# Patient Record
Sex: Male | Born: 1935 | Race: White | Hispanic: No | Marital: Married | Smoking: Former smoker
Health system: Southern US, Community
[De-identification: ages and names within clinical notes are randomized; demographics above are authoritative.]

## PROBLEM LIST (undated history)

## (undated) DIAGNOSIS — F329 Major depressive disorder, single episode, unspecified: Secondary | ICD-10-CM

## (undated) DIAGNOSIS — K635 Polyp of colon: Secondary | ICD-10-CM

## (undated) DIAGNOSIS — M25559 Pain in unspecified hip: Secondary | ICD-10-CM

## (undated) DIAGNOSIS — K5790 Diverticulosis of intestine, part unspecified, without perforation or abscess without bleeding: Secondary | ICD-10-CM

## (undated) DIAGNOSIS — F32A Depression, unspecified: Secondary | ICD-10-CM

## (undated) DIAGNOSIS — G250 Essential tremor: Secondary | ICD-10-CM

## (undated) DIAGNOSIS — G252 Other specified forms of tremor: Secondary | ICD-10-CM

## (undated) DIAGNOSIS — M549 Dorsalgia, unspecified: Secondary | ICD-10-CM

## (undated) DIAGNOSIS — I509 Heart failure, unspecified: Secondary | ICD-10-CM

## (undated) DIAGNOSIS — R269 Unspecified abnormalities of gait and mobility: Secondary | ICD-10-CM

## (undated) DIAGNOSIS — F068 Other specified mental disorders due to known physiological condition: Secondary | ICD-10-CM

## (undated) DIAGNOSIS — E119 Type 2 diabetes mellitus without complications: Secondary | ICD-10-CM

## (undated) DIAGNOSIS — C4440 Unspecified malignant neoplasm of skin of scalp and neck: Secondary | ICD-10-CM

## (undated) DIAGNOSIS — M48061 Spinal stenosis, lumbar region without neurogenic claudication: Secondary | ICD-10-CM

## (undated) DIAGNOSIS — Z96643 Presence of artificial hip joint, bilateral: Secondary | ICD-10-CM

## (undated) DIAGNOSIS — K649 Unspecified hemorrhoids: Secondary | ICD-10-CM

## (undated) DIAGNOSIS — F0390 Unspecified dementia without behavioral disturbance: Secondary | ICD-10-CM

## (undated) DIAGNOSIS — R413 Other amnesia: Secondary | ICD-10-CM

## (undated) DIAGNOSIS — G609 Hereditary and idiopathic neuropathy, unspecified: Secondary | ICD-10-CM

## (undated) HISTORY — DX: Diverticulosis of intestine, part unspecified, without perforation or abscess without bleeding: K57.90

## (undated) HISTORY — DX: Unspecified malignant neoplasm of skin of scalp and neck: C44.40

## (undated) HISTORY — DX: Hereditary and idiopathic neuropathy, unspecified: G60.9

## (undated) HISTORY — DX: Unspecified dementia without behavioral disturbance: F03.90

## (undated) HISTORY — DX: Essential tremor: G25.0

## (undated) HISTORY — DX: Pain in unspecified hip: M25.559

## (undated) HISTORY — DX: Essential tremor: G25.2

## (undated) HISTORY — DX: Unspecified abnormalities of gait and mobility: R26.9

## (undated) HISTORY — DX: Other specified mental disorders due to known physiological condition: F06.8

## (undated) HISTORY — DX: Spinal stenosis, lumbar region without neurogenic claudication: M48.061

## (undated) HISTORY — DX: Dorsalgia, unspecified: M54.9

## (undated) HISTORY — DX: Unspecified hemorrhoids: K64.9

## (undated) HISTORY — PX: JOINT REPLACEMENT: SHX530

## (undated) HISTORY — DX: Presence of artificial hip joint, bilateral: Z96.643

## (undated) HISTORY — DX: Polyp of colon: K63.5

## (undated) HISTORY — PX: SKIN CANCER EXCISION: SHX779

---

## 1998-03-14 ENCOUNTER — Encounter: Payer: Self-pay | Admitting: Orthopedic Surgery

## 1998-03-14 ENCOUNTER — Ambulatory Visit (HOSPITAL_COMMUNITY): Admission: RE | Admit: 1998-03-14 | Discharge: 1998-03-14 | Payer: Self-pay | Admitting: Orthopedic Surgery

## 1998-04-02 HISTORY — PX: TOTAL HIP ARTHROPLASTY: SHX124

## 1998-04-22 ENCOUNTER — Ambulatory Visit (HOSPITAL_COMMUNITY): Admission: RE | Admit: 1998-04-22 | Discharge: 1998-04-22 | Payer: Self-pay | Admitting: Orthopedic Surgery

## 1998-04-22 ENCOUNTER — Encounter: Payer: Self-pay | Admitting: Orthopedic Surgery

## 1998-07-08 ENCOUNTER — Encounter: Payer: Self-pay | Admitting: Orthopedic Surgery

## 1998-07-19 ENCOUNTER — Encounter: Payer: Self-pay | Admitting: Orthopedic Surgery

## 1998-07-19 ENCOUNTER — Inpatient Hospital Stay (HOSPITAL_COMMUNITY): Admission: RE | Admit: 1998-07-19 | Discharge: 1998-07-25 | Payer: Self-pay | Admitting: Orthopedic Surgery

## 2001-04-14 ENCOUNTER — Encounter: Admission: RE | Admit: 2001-04-14 | Discharge: 2001-04-14 | Payer: Self-pay | Admitting: Internal Medicine

## 2001-04-14 ENCOUNTER — Encounter: Payer: Self-pay | Admitting: Internal Medicine

## 2002-05-21 ENCOUNTER — Encounter: Payer: Self-pay | Admitting: Neurology

## 2002-05-21 ENCOUNTER — Ambulatory Visit (HOSPITAL_COMMUNITY): Admission: RE | Admit: 2002-05-21 | Discharge: 2002-05-21 | Payer: Self-pay | Admitting: Neurology

## 2002-07-02 ENCOUNTER — Encounter: Payer: Self-pay | Admitting: Orthopedic Surgery

## 2002-07-02 ENCOUNTER — Encounter: Admission: RE | Admit: 2002-07-02 | Discharge: 2002-07-02 | Payer: Self-pay | Admitting: Orthopedic Surgery

## 2006-09-12 ENCOUNTER — Ambulatory Visit: Payer: Self-pay | Admitting: Internal Medicine

## 2006-09-26 ENCOUNTER — Encounter: Payer: Self-pay | Admitting: Internal Medicine

## 2006-09-26 ENCOUNTER — Ambulatory Visit: Payer: Self-pay | Admitting: Internal Medicine

## 2007-12-02 ENCOUNTER — Encounter: Admission: RE | Admit: 2007-12-02 | Discharge: 2007-12-02 | Payer: Self-pay | Admitting: Sports Medicine

## 2008-06-11 ENCOUNTER — Encounter: Admission: RE | Admit: 2008-06-11 | Discharge: 2008-06-11 | Payer: Self-pay | Admitting: Orthopedic Surgery

## 2009-04-02 HISTORY — PX: OTHER SURGICAL HISTORY: SHX169

## 2009-12-20 ENCOUNTER — Inpatient Hospital Stay (HOSPITAL_COMMUNITY)
Admission: RE | Admit: 2009-12-20 | Discharge: 2009-12-23 | Payer: Self-pay | Source: Home / Self Care | Admitting: Orthopedic Surgery

## 2010-04-02 HISTORY — PX: RETINAL LASER PROCEDURE: SHX2339

## 2010-04-02 HISTORY — PX: CATARACT EXTRACTION, BILATERAL: SHX1313

## 2010-06-15 LAB — BASIC METABOLIC PANEL
BUN: 16 mg/dL (ref 6–23)
CO2: 27 mEq/L (ref 19–32)
CO2: 29 mEq/L (ref 19–32)
Calcium: 7.9 mg/dL — ABNORMAL LOW (ref 8.4–10.5)
Calcium: 8.2 mg/dL — ABNORMAL LOW (ref 8.4–10.5)
Chloride: 102 mEq/L (ref 96–112)
Chloride: 108 mEq/L (ref 96–112)
Chloride: 107 mEq/L (ref 96–112)
Creatinine, Ser: 0.88 mg/dL (ref 0.4–1.5)
Creatinine, Ser: 0.93 mg/dL (ref 0.4–1.5)
GFR calc Af Amer: >60 mL/min (ref >60)
GFR calc Af Amer: >60 mL/min (ref >60)
GFR calc Af Amer: >60 mL/min (ref >60)
GFR calc non Af Amer: >60 mL/min (ref >60)
Glucose, Bld: 164 mg/dL — ABNORMAL HIGH (ref 70–99)
Potassium: 3.6 mEq/L (ref 3.5–5.1)
Potassium: 3.6 mEq/L (ref 3.5–5.1)
Sodium: 137 mEq/L (ref 135–145)
Sodium: 138 mEq/L (ref 135–145)
Sodium: 141 mEq/L (ref 135–145)

## 2010-06-15 LAB — CBC
HCT: 43.9 % (ref 39.0–52.0)
Hemoglobin: 11 g/dL — ABNORMAL LOW (ref 13.0–17.0)
Hemoglobin: 9.4 g/dL — ABNORMAL LOW (ref 13.0–17.0)
Hemoglobin: 15.1 g/dL (ref 13.0–17.0)
MCH: 34.7 pg — ABNORMAL HIGH (ref 26.0–34.0)
MCHC: 34.6 g/dL (ref 30.0–36.0)
MCV: 101.2 fL — ABNORMAL HIGH (ref 78.0–100.0)
MCV: 101.7 fL — ABNORMAL HIGH (ref 78.0–100.0)
Platelets: 103 10*3/uL — ABNORMAL LOW (ref 150–400)
Platelets: 91 10*3/uL — ABNORMAL LOW (ref 150–400)
Platelets: 136 10*3/uL — ABNORMAL LOW (ref 150–400)
RBC: 2.67 MIL/uL — ABNORMAL LOW (ref 4.22–5.81)
RBC: 4.32 MIL/uL (ref 4.22–5.81)
RDW: 13 % (ref 11.5–15.5)
WBC: 10.1 10*3/uL (ref 4.0–10.5)
WBC: 8.8 10*3/uL (ref 4.0–10.5)

## 2010-06-15 LAB — TYPE AND SCREEN
ABO/RH(D): A NEG
Antibody Screen: NEGATIVE

## 2010-06-15 LAB — GLUCOSE, CAPILLARY
Glucose-Capillary: 114 mg/dL — ABNORMAL HIGH (ref 70–99)
Glucose-Capillary: 116 mg/dL — ABNORMAL HIGH (ref 70–99)
Glucose-Capillary: 124 mg/dL — ABNORMAL HIGH (ref 70–99)
Glucose-Capillary: 133 mg/dL — ABNORMAL HIGH (ref 70–99)
Glucose-Capillary: 133 mg/dL — ABNORMAL HIGH (ref 70–99)
Glucose-Capillary: 155 mg/dL — ABNORMAL HIGH (ref 70–99)
Glucose-Capillary: 160 mg/dL — ABNORMAL HIGH (ref 70–99)
Glucose-Capillary: 160 mg/dL — ABNORMAL HIGH (ref 70–99)
Glucose-Capillary: 192 mg/dL — ABNORMAL HIGH (ref 70–99)

## 2010-06-15 LAB — PROTIME-INR: Prothrombin Time: 13.4 seconds (ref 11.6–15.2)

## 2010-06-15 LAB — DIFFERENTIAL
Eosinophils Relative: 3 % (ref 0–5)
Lymphocytes Relative: 17 % (ref 12–46)
Lymphs Abs: 1.5 10*3/uL (ref 0.7–4.0)
Monocytes Absolute: 0.5 10*3/uL (ref 0.1–1.0)
Monocytes Relative: 6 % (ref 3–12)

## 2010-06-15 LAB — URINALYSIS, ROUTINE W REFLEX MICROSCOPIC
Glucose, UA: NEGATIVE mg/dL
Hgb urine dipstick: NEGATIVE
pH: 6 (ref 5.0–8.0)

## 2010-07-05 ENCOUNTER — Ambulatory Visit: Payer: Self-pay | Admitting: Ophthalmology

## 2010-07-12 ENCOUNTER — Ambulatory Visit: Payer: Self-pay | Admitting: Ophthalmology

## 2010-10-26 ENCOUNTER — Encounter: Payer: Self-pay | Admitting: Podiatry

## 2010-12-21 ENCOUNTER — Other Ambulatory Visit: Payer: Self-pay | Admitting: Internal Medicine

## 2010-12-21 DIAGNOSIS — F0390 Unspecified dementia without behavioral disturbance: Secondary | ICD-10-CM

## 2011-01-01 ENCOUNTER — Ambulatory Visit
Admission: RE | Admit: 2011-01-01 | Discharge: 2011-01-01 | Disposition: A | Discharge status: Home / Self Care | Payer: Medicare Other | Source: Ambulatory Visit | Attending: Internal Medicine | Admitting: Internal Medicine

## 2011-01-01 DIAGNOSIS — F0390 Unspecified dementia without behavioral disturbance: Secondary | ICD-10-CM

## 2011-01-03 ENCOUNTER — Ambulatory Visit: Payer: Medicare Other | Attending: Internal Medicine | Admitting: Rehabilitative and Restorative Service Providers"

## 2011-01-03 DIAGNOSIS — IMO0001 Reserved for inherently not codable concepts without codable children: Secondary | ICD-10-CM | POA: Insufficient documentation

## 2011-01-03 DIAGNOSIS — R269 Unspecified abnormalities of gait and mobility: Secondary | ICD-10-CM | POA: Insufficient documentation

## 2011-01-03 DIAGNOSIS — M6281 Muscle weakness (generalized): Secondary | ICD-10-CM | POA: Insufficient documentation

## 2011-01-16 ENCOUNTER — Ambulatory Visit: Payer: Medicare Other | Admitting: Rehabilitative and Restorative Service Providers"

## 2011-01-19 ENCOUNTER — Ambulatory Visit: Payer: Medicare Other | Admitting: Rehabilitative and Restorative Service Providers"

## 2011-01-24 ENCOUNTER — Ambulatory Visit: Payer: Medicare Other | Admitting: Rehabilitative and Restorative Service Providers"

## 2011-01-26 ENCOUNTER — Ambulatory Visit: Payer: Medicare Other | Admitting: Rehabilitative and Restorative Service Providers"

## 2011-01-31 ENCOUNTER — Ambulatory Visit: Payer: Medicare Other | Admitting: Rehabilitative and Restorative Service Providers"

## 2011-02-01 ENCOUNTER — Ambulatory Visit
Admission: RE | Admit: 2011-02-01 | Discharge: 2011-02-01 | Disposition: A | Discharge status: Home / Self Care | Payer: Medicare Other | Source: Ambulatory Visit | Attending: Internal Medicine | Admitting: Internal Medicine

## 2011-02-01 ENCOUNTER — Encounter: Payer: Medicare Other | Admitting: Rehabilitative and Restorative Service Providers"

## 2011-02-01 ENCOUNTER — Other Ambulatory Visit: Payer: Self-pay | Admitting: Internal Medicine

## 2011-02-01 DIAGNOSIS — IMO0002 Reserved for concepts with insufficient information to code with codable children: Secondary | ICD-10-CM

## 2011-02-05 ENCOUNTER — Encounter: Payer: Medicare Other | Admitting: Rehabilitative and Restorative Service Providers"

## 2011-02-08 ENCOUNTER — Encounter: Payer: Medicare Other | Admitting: Rehabilitative and Restorative Service Providers"

## 2011-07-16 ENCOUNTER — Other Ambulatory Visit: Payer: Self-pay | Admitting: Dermatology

## 2012-04-08 ENCOUNTER — Other Ambulatory Visit: Payer: Self-pay | Admitting: Dermatology

## 2012-05-27 ENCOUNTER — Other Ambulatory Visit: Payer: Self-pay | Admitting: Neurology

## 2012-05-27 DIAGNOSIS — F0390 Unspecified dementia without behavioral disturbance: Secondary | ICD-10-CM

## 2012-06-01 ENCOUNTER — Ambulatory Visit
Admission: RE | Admit: 2012-06-01 | Discharge: 2012-06-01 | Disposition: A | Discharge status: Home / Self Care | Payer: No Typology Code available for payment source | Source: Ambulatory Visit | Attending: Neurology | Admitting: Neurology

## 2012-06-01 DIAGNOSIS — F0390 Unspecified dementia without behavioral disturbance: Secondary | ICD-10-CM

## 2012-06-01 DIAGNOSIS — R413 Other amnesia: Secondary | ICD-10-CM

## 2012-06-11 ENCOUNTER — Other Ambulatory Visit: Payer: Self-pay | Admitting: Neurology

## 2012-06-11 DIAGNOSIS — R413 Other amnesia: Secondary | ICD-10-CM

## 2012-06-18 ENCOUNTER — Encounter (HOSPITAL_COMMUNITY): Payer: Self-pay

## 2012-06-18 ENCOUNTER — Encounter (HOSPITAL_COMMUNITY)
Admission: RE | Admit: 2012-06-18 | Discharge: 2012-06-18 | Disposition: A | Discharge status: Home / Self Care | Payer: Medicare Other | Source: Ambulatory Visit | Attending: Neurology | Admitting: Neurology

## 2012-06-18 DIAGNOSIS — R413 Other amnesia: Secondary | ICD-10-CM | POA: Insufficient documentation

## 2012-06-18 HISTORY — DX: Other amnesia: R41.3

## 2012-07-09 ENCOUNTER — Other Ambulatory Visit: Payer: Self-pay | Admitting: Internal Medicine

## 2012-07-09 DIAGNOSIS — R350 Frequency of micturition: Secondary | ICD-10-CM

## 2012-07-09 DIAGNOSIS — N390 Urinary tract infection, site not specified: Secondary | ICD-10-CM

## 2012-07-10 ENCOUNTER — Ambulatory Visit
Admission: RE | Admit: 2012-07-10 | Discharge: 2012-07-10 | Disposition: A | Discharge status: Home / Self Care | Payer: Medicare Other | Source: Ambulatory Visit | Attending: Internal Medicine | Admitting: Internal Medicine

## 2012-07-10 DIAGNOSIS — N390 Urinary tract infection, site not specified: Secondary | ICD-10-CM

## 2012-07-10 DIAGNOSIS — R350 Frequency of micturition: Secondary | ICD-10-CM

## 2012-11-03 ENCOUNTER — Telehealth: Payer: Self-pay | Admitting: Neurology

## 2012-11-03 NOTE — Telephone Encounter (Signed)
Pt has not been seen by Dr. Athar, prior Dr. Love pt needs to be reassigned per Dr. Athar °

## 2012-11-04 NOTE — Telephone Encounter (Signed)
Ok to be seen by Dr. Frances Furbish schedule is fixed. KJ

## 2012-11-12 ENCOUNTER — Encounter: Payer: Self-pay | Admitting: Neurology

## 2012-11-12 ENCOUNTER — Ambulatory Visit (INDEPENDENT_AMBULATORY_CARE_PROVIDER_SITE_OTHER): Payer: Medicare Other | Admitting: Neurology

## 2012-11-12 VITALS — BP 126/73 | HR 61 | Temp 97.5°F | Ht 69.0 in | Wt 193.0 lb

## 2012-11-12 DIAGNOSIS — F0280 Dementia in other diseases classified elsewhere without behavioral disturbance: Secondary | ICD-10-CM | POA: Insufficient documentation

## 2012-11-12 DIAGNOSIS — F0390 Unspecified dementia without behavioral disturbance: Secondary | ICD-10-CM

## 2012-11-12 DIAGNOSIS — Z96643 Presence of artificial hip joint, bilateral: Secondary | ICD-10-CM | POA: Insufficient documentation

## 2012-11-12 DIAGNOSIS — Z96649 Presence of unspecified artificial hip joint: Secondary | ICD-10-CM

## 2012-11-12 HISTORY — DX: Presence of artificial hip joint, bilateral: Z96.643

## 2012-11-12 HISTORY — DX: Unspecified dementia, unspecified severity, without behavioral disturbance, psychotic disturbance, mood disturbance, and anxiety: F03.90

## 2012-11-12 NOTE — Progress Notes (Signed)
Subjective:    Patient ID: Jose [name] is a 79 y.o. male.  HPI  Interim history:   Jose [name] is a very friendly 79 year old right-handed gentleman who presents for followup consultation of his memory loss. He is accompanied by his wife today. This is his first visit after Dr. Imagene Gurney retirement and his last visit with Dr. Sandria Manly was on 05/14/2012, which time Dr. Sandria Manly felt that he was a potential candidate for a clinical trial. The patient's MMSE score was 26, clock drawing was 4 out of 4, and his falls assessment tool score was 12. He has an underlying medical history of left eye retinal disease, arthritis and status post left hip replacement in 2000, right hip replacement in 2011, essential tremor with a positive FHx, peripheral neuropathy, hyperlipidemia, hypertension, low back pain and memory loss. He is currently on dorzolamide, furosemide, tramadol, gabapentin, Klor-Con, flaxseed oil, simvastatin, atenolol, Wellbutrin XL, Aricept 23 mg, adult aspirin, Namenda, multivitamin, fish oil, vitamin D, vitamin C, folic acid.  I reviewed Dr. Imagene Gurney prior notes and the patient's records and below is a summary of that review:  79 year old right-handed gentleman with progressive memory loss since April 2000. This was after his hip surgery. His second hip surgery was done under spinal anesthesia with good results. EMG and nerve conduction studies as well as lumbar spine MRI in November 2012 showed multilevel spondylosis, as moderately severe canal stenosis and broad-based disc bulge at L1-2. B12 level, RPR, TSH were normal. MRI brain with and without contrast on February 2004 showed atrophy and small vessel disease. He had a repeat MRI in October 2012. He has been on Aricept since 2004 and [place] since March 2005. He also has peripheral neuropathy for which he had evaluation in January 2009 with SPEP, ACE level, vitamin B12, hemoglobin A1c, PSA, TSH, lipids, CBC, CMP, urinalysis, all normal in February 2010. In  December 2012 his MMSE was 63, and in April 2013 his MMSE was 22, clock drawing was 4, animal fluency was 18.  He was recently started on low dose Lexapro by Dr. Waynard Edwards. The patient has had a tendency to pick at his skin lesions on the scalp. His son and daughter are local and involved. 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 He no longer drives for the past year. He goes for aqua exercises 3 times/week.  He has had no VH/AH.  Eating too much and forgetting that he ate has been an issue per wife.  He had a sleep study in the past year, which was negative for OSA per wife.  His Past Medical History Is Significant For: Past Medical History  Diagnosis Date  . Memory loss   . 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 Spinal stenosis, lumbar region, without neurogenic claudication   . Abnormality of gait   . Backache, unspecified   . Unspecified hereditary and idiopathic peripheral neuropathy   . Pain in joint, pelvic region and thigh   . Essential and other specified forms of tremor   . Dementia 11/12/2012  . History of bilateral hip replacements 11/12/2012    His Past Surgical History Is Significant For: Past Surgical History  Procedure Laterality Date  . Total hip arthroplasty Left 2000  . Total hip arthroplasty (aka replacement) Right 2011  . Cataract extraction, bilateral  2012  . Retinal laser procedure  2012    retinal wrinkle    His Family History Is Significant For: Family History  Problem Relation  Age of Onset  . Heart failure Mother     His Social History Is Significant For: History   Social History  . Marital Status: Married    Spouse Name: N/A    Number of Children: N/A  . Years of Education: N/A   Social History Main Topics  . Smoking status: Former Smoker    Types: Cigarettes  . Smokeless tobacco: None  . Alcohol Use: 8.4 oz/week    14 Glasses of wine per week  . Drug Use: No  . Sexual Activity: None   Other Topics  Concern  . None   Social History Narrative  . None    His Allergies Are:  No Known Allergies:   His Current Medications Are:  Outpatient Encounter Prescriptions as of 11/12/2012  Medication Sig Dispense Refill  . Ascorbic Acid (VITAMIN C) 1000 MG tablet Take 1,000 mg by mouth daily.        Marland Kitchen aspirin 81 MG tablet Take 81 mg by mouth daily.        Marland Kitchen atenolol-chlorthalidone (TENORETIC) 50-25 MG per tablet Take 0.5 tablets by mouth daily.        Marland Kitchen buPROPion (WELLBUTRIN XL) 150 MG 24 hr tablet Take 300 mg by mouth daily.        . Cholecalciferol (VITAMIN D) 1000 UNITS capsule Take 1,000 Units by mouth daily.        Marland Kitchen donepezil (ARICEPT) 23 MG TABS tablet Take 23 mg by mouth daily.        . dorzolamide-timolol (COSOPT) 22.3-6.8 MG/ML ophthalmic solution       . escitalopram (LEXAPRO) 10 MG tablet       . Flaxseed, Linseed, (FLAXSEED OIL) 1000 MG CAPS Take 1,000 mg by mouth daily.        . folic acid (FOLVITE) 400 MCG tablet Take 400 mcg by mouth daily.        . furosemide (LASIX) 20 MG tablet       . gabapentin (NEURONTIN) 100 MG capsule       . KLOR-CON M10 10 MEQ tablet       . memantine (NAMENDA) 10 MG tablet Take 28 mg by mouth daily.       . metFORMIN (GLUCOPHAGE) 500 MG tablet       . Multiple Vitamin (MULTIVITAMIN) tablet Take 1 tablet by mouth daily.        . Omega-3 Fatty Acids (FISH OIL) 1000 MG CAPS Take 1,000 mg by mouth 3 (three) times daily.        . Omeprazole (PRILOSEC PO) Take by mouth 2 (two) times a week.        . potassium chloride (KLOR-CON) 10 MEQ CR tablet Take 10 mEq by mouth 2 (two) times a week.        . simvastatin (ZOCOR) 40 MG tablet Take 40 mg by mouth daily.         No facility-administered encounter medications on file as of 11/12/2012.    Review of Systems  Constitutional: Positive for fatigue.  Cardiovascular: Positive for leg swelling.  Neurological:       Memory  Psychiatric/Behavioral:       Too much sleep    Objective:  Neurologic  Exam  Physical Exam Physical Examination:   Filed Vitals:   11/12/12 1046  BP: 126/73  Pulse: 61  Temp: 97.5 F (36.4 C)    General Examination: The patient is a very pleasant 79 y.o. male in no acute distress. He is calm  and cooperative with the exam. He denies Auditory Hallucinations and Visual Hallucinations.   HEENT: Normocephalic, atraumatic, pupils are equal, round and reactive to light and accommodation. Funduscopic exam is normal with sharp disc margins noted. Extraocular tracking shows mild saccadic breakdown without nystagmus noted. Hearing is intact. Tympanic membranes are clear bilaterally. Face is symmetric with no facial masking and normal facial sensation. There is no lip, neck or jaw tremor. Neck is not rigid with intact passive ROM. There are no carotid bruits on auscultation. Oropharynx exam reveals mild mouth dryness. No significant airway crowding is noted. Mallampati is class I. Tongue protrudes centrally and palate elevates symmetrically.    Chest: is clear to auscultation without wheezing, rhonchi or crackles noted.  Heart: sounds are regular and normal without murmurs, rubs or gallops noted.   Abdomen: is soft, non-tender and non-distended with normal bowel sounds appreciated on auscultation.  Extremities: There is 1+ pitting edema in the distal lower extremities bilaterally, left worse. Pedal pulses are intact.  Skin: is warm and dry with no trophic changes noted.  Musculoskeletal: exam reveals no obvious joint deformities, tenderness or joint swelling or erythema.  Neurologically:  Mental status: The patient is awake and alert, paying fair  attention. He is able to partially provide the history. His wife provides most of his Hx. He is oriented to: person, place and day of week. His memory, attention, language and knowledge are impaired. There is no aphasia, agnosia, apraxia or anomia. There is a mild degree of bradyphrenia. Speech is mildly hypophonic with no  dysarthria noted. Mood is congruent and affect is normal.  His MMSE score is 23/30. CDT is 4/4. AFT (Animal Fluency Test) score is 15.   Cranial nerves are as described above under HEENT exam. In addition, shoulder shrug is normal with equal shoulder height noted.  Motor exam: Normal bulk, and strength for age is noted. Tone is not rigid with absence of cogwheeling in the extremities. There is overall no significant bradykinesia. There is no drift or rebound. There is a mild postural and action tremor in both upper extremities. There is no significant resting tremor. Romberg is negative. Reflexes are 1+ in the upper extremities and 1+ in the lower extremities. Fine motor skills are mildly impaired bilaterally.   There is no truncal or gait ataxia.   Sensory exam is intact to light touch.  Gait, station and balance: He stands up from the seated position with mild difficulty and posture is mildly stooped. Stance is narrow-based. He turns in 3 steps. Balance is fairly well preserved.   Assessment and Plan:   Assessment and Plan:  In summary, Jose [name] is a very pleasant 79 y.o.-year old male with an underlying medical history of chronic back pain, arthritis, depression, reflux disease, and hypertension who has a long-standing history of dementia. His MMSE is a little worse than last time. Category fluency is also little less than last time. He is on the maximum doses of Aricept and Namenda and did not qualify for the clinical trial for dementia. I would be reluctant to switch him to another anti-cholinergic medication as he's at risk for side effects. He and his wife are agreeable to staying with the same regimen. Unfortunately there is another whole lot we can do to alter the course of neurodegenerative diseases which are usually empirically progressive. Thankfully he has a very caring and involved family and a good support system overall. He continues to stay active. I have encouraged him to drink  more water.  I had a long chat with the patient and his about my findings and the diagnosis of memory loss and dementia, its prognosis and treatment options. Implications of diagnosis explained at length with the patient and caregiver. We talked about medical treatments and non-pharmacological approaches. We talked about maintaining a healthy lifestyle in general and staying active mentally and physically. I encouraged the patient to eat healthy, exercise daily and keep well hydrated, to keep a scheduled bedtime and wake time routine, to not skip any meals and eat healthy snacks in between meals and to have protein with every meal. I stressed the importance of regular exercise, within of course the patient's own mobility limitations. I encouraged the patient to keep up with current events by reading the news paper or watching the news.   As far as further diagnostic testing is concerned, I suggested the following: no change.  As far as medications are concerned, I recommended the following at this time: no change. He did not require refills today. I answered all their questions today and the patient and his wife were in agreement with the above outlined plan. I would like to see the patient back in 6 months, sooner if the need arises and encouraged them to call with any interim questions, concerns, problems, updates and refill requests.

## 2012-11-12 NOTE — Patient Instructions (Addendum)
I think overall you are doing fairly well and are stable at this point.   I do have some generic suggestions for you today:  Please make sure that you drink plenty of fluids. I would like for you to exercise daily for example in the form of walking 20-30 minutes every day, if you can. Please keep a regular sleep-wake schedule, keep regular meal times, do not skip any meals, eat  healthy snacks in between meals, such as fruit or nuts. Try to eat protein with every meal.   As far as your medications are concerned, I would like to suggest: no changes.    As far as diagnostic testing, I recommend: no new medication.   Engage in social activities in your community and with your family and try to keep up with current events by reading the newspaper or watching the news.  I do not think we need to make any changes in your medications at this point. I think you're stable enough that I can see you back in 6 months, sooner if we need to. Please call us if you have any interim questions, concerns, or problems or updates to need to discuss.  Brett Canales is my clinical assistant and will answer any of your questions and relay your messages to me and will give you my messages.   Our phone number is [phone]. We also have an after hours call service for urgent matters and there is a physician on-call for urgent questions. For any emergencies you know to call 911 or go to the nearest emergency room.

## 2012-12-10 ENCOUNTER — Other Ambulatory Visit: Payer: Self-pay

## 2013-01-15 ENCOUNTER — Ambulatory Visit: Payer: Medicare Other | Admitting: Podiatry

## 2013-02-02 ENCOUNTER — Encounter: Payer: Self-pay | Admitting: Podiatry

## 2013-02-02 ENCOUNTER — Ambulatory Visit (INDEPENDENT_AMBULATORY_CARE_PROVIDER_SITE_OTHER): Payer: Medicare Other | Admitting: Podiatry

## 2013-02-02 VITALS — BP 132/64 | HR 92 | Resp 12

## 2013-02-02 DIAGNOSIS — B351 Tinea unguium: Secondary | ICD-10-CM

## 2013-02-02 DIAGNOSIS — M79609 Pain in unspecified limb: Secondary | ICD-10-CM

## 2013-02-03 NOTE — Progress Notes (Signed)
Subjective:     Patient ID: Jose [name], male   DOB: [birth year], 79 y.o.   MRN: [id number]  HPI patient states please cut my toenails they become tender and they are thick   Review of Systems     Objective:   Physical Exam  Nursing note and vitals reviewed. Constitutional: He is oriented to person, place, and time.  Neurological: He is oriented to person, place, and time.  Skin: Skin is dry.   nail disease with thickness and discomfort 1-5 both feet     Assessment:     Mycotic nail infection with discomfort 1-5 both feet    Plan:     Debridement of nailbeds 1-5 both feet with no iatrogenic bleeding noted

## 2013-04-27 ENCOUNTER — Ambulatory Visit: Payer: Medicare Other | Admitting: Podiatry

## 2013-05-04 ENCOUNTER — Ambulatory Visit (INDEPENDENT_AMBULATORY_CARE_PROVIDER_SITE_OTHER): Payer: Medicare Other | Admitting: Podiatry

## 2013-05-04 ENCOUNTER — Encounter: Payer: Self-pay | Admitting: Podiatry

## 2013-05-04 VITALS — BP 87/70 | HR 81 | Resp 18

## 2013-05-04 DIAGNOSIS — M79609 Pain in unspecified limb: Secondary | ICD-10-CM

## 2013-05-04 DIAGNOSIS — B351 Tinea unguium: Secondary | ICD-10-CM

## 2013-05-04 NOTE — Progress Notes (Signed)
   Subjective:    Patient ID: Jose [name], male    DOB: 1930/10/18, 80 y.o.   MRN: [id number]  HPI  I need my toenails cut on both feet This patient was last seen for similar treatment by DR. Regal on 02/03/2013.  Review of Systems     Objective:   Physical Exam  Orientated x11 80 year old white male  Elongated, hypertrophic, discolored toenails with palpable tenderness in all nail plates.      Assessment & Plan:   Assessment: Symptomatic onychomycoses x10  Plan: Nails x10 are debrided back without a bleeding. Reappoint at three-month intervals

## 2013-05-14 ENCOUNTER — Ambulatory Visit (INDEPENDENT_AMBULATORY_CARE_PROVIDER_SITE_OTHER): Payer: Medicare Other | Admitting: Neurology

## 2013-05-14 ENCOUNTER — Encounter: Payer: Self-pay | Admitting: Neurology

## 2013-05-14 VITALS — BP 132/80 | HR 60 | Temp 96.5°F | Ht 69.0 in | Wt 192.0 lb

## 2013-05-14 DIAGNOSIS — Z96649 Presence of unspecified artificial hip joint: Secondary | ICD-10-CM

## 2013-05-14 DIAGNOSIS — F0390 Unspecified dementia without behavioral disturbance: Secondary | ICD-10-CM

## 2013-05-14 DIAGNOSIS — Z96643 Presence of artificial hip joint, bilateral: Secondary | ICD-10-CM

## 2013-05-14 MED ORDER — MEMANTINE HCL ER 28 MG PO CP24: 28.0000 mg | ORAL_CAPSULE | Freq: Every day | ORAL | Status: DC

## 2013-05-14 MED ORDER — DONEPEZIL HCL 23 MG PO TABS: 23.0000 mg | ORAL_TABLET | Freq: Every day | ORAL | Status: DC

## 2013-05-14 NOTE — Patient Instructions (Addendum)
I think overall you are doing fairly well but I do want to suggest a few things today:  Remember to drink plenty of fluid, eat healthy meals and do not skip any meals. Try to eat protein with a every meal and eat a healthy snack such as fruit or nuts in between meals. Try to keep a regular sleep-wake schedule and try to exercise daily, particularly in the form of walking, 20-30 minutes a day, if you can.   Engage in social activities in your community and with your family and try to keep up with current events by reading the newspaper or watching the news.   As far as your medications are concerned, I would like to suggest no changes today.   As far as diagnostic testing: no new test needed.   I would like to see you back in 6 months, sooner if we need to. Please call us with any interim questions, concerns, problems, updates or refill requests.  Our nursing staff will answer any of your questions and relay your messages to me and also relay most of my messages to you.  Our phone number is [phone]. We also have an after hours call service for urgent matters and there is a physician on-call for urgent questions. For any emergencies you know to call 911 or go to the nearest emergency room.

## 2013-05-14 NOTE — Progress Notes (Signed)
Subjective:    Patient ID: Jose [name] is a 80 y.o. male.  HPI    Interim history:   Mr. Jose [name] is a very friendly 80 year old right-handed gentleman with an underlying medical history of left eye retinal disease, arthritis and status post L THR in 2000, R THR in 2011, ET with a positive FHx, PN, HLP, HTN, LBP, depression, reflux disease, and memory loss, who presents for followup consultation of his memory loss. He is accompanied by his wife again today. I first met him on 11/12/2012, which time I felt that his MMSE was a little worse and his category fluency was also less than before. His MMSE score was 23/30, CDT was 4/4, AFT (Animal Fluency Test) score was 15. He has been on  maximum doses of Aricept and Namenda and he did not qualify for the clinical trial for dementia. I was reluctant to switch him to another anti-cholinergic medication for fear of side effects.   Today, he reports feeling stable and his wife agrees. He has been on Namenda XR 28 mg and Aricept 23 mg daily, tolerating it well. He goes to swim twice a week and walks once weekly. He has a Physiological scientist who works with him very well. He eats well. He does not drink enough water. He drinks a cup of coffee in the morning and likes to drink tea during the day.   He previously followed with Dr. Morene Antu and was last seen by him on 05/14/2012, which time Dr. Erling Cruz felt that he was a potential candidate for a clinical trial. The patient's MMSE score was 26, clock drawing was 4 out of 4, and his falls assessment tool score was 12.  He started having progressive memory loss in April 2000. This was after his hip surgery. His second hip surgery was done under spinal anesthesia with good results. EMG and nerve conduction studies as well as lumbar spine MRI in November 2012 showed multilevel spondylosis, as moderately severe canal stenosis and broad-based disc bulge at L1-2. B12 level, RPR, TSH were normal. MRI brain with and without contrast  on February 2004 showed atrophy and small vessel disease. He had a repeat brain MRI in October 2012. He has been on Aricept since 2004 and [place] since March 2005. He also has peripheral neuropathy for which he had evaluation in January 2009 with SPEP, ACE level, vitamin B12, hemoglobin A1c, PSA, TSH, lipids, CBC, CMP, urinalysis, all normal in February 2010. In December 2012 his MMSE was 53, and in April 2013 his MMSE was 22, clock drawing was 4, animal fluency was 18.  He was started on low dose Lexapro by Dr. Joylene Draft. The patient has had a tendency to pick at his skin lesions on the scalp. His son and daughter are local and involved. 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 He no longer drives. He goes for aqua exercises 3 times/week. He has had no VH/AH. Eating too much and forgetting that he ate has been an issue per wife. He had a sleep study in the past in 2013, which was negative for OSA per wife.  His Past Medical History Is Significant For: Past Medical History  Diagnosis Date  . Memory loss   . 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 Spinal stenosis, lumbar region, without neurogenic claudication   . Abnormality of gait   . Backache, unspecified   . Unspecified hereditary and idiopathic peripheral neuropathy   . Pain in  joint, pelvic region and thigh   . Essential and other specified forms of tremor   . Dementia 11/12/2012  . History of bilateral hip replacements 11/12/2012    His Past Surgical History Is Significant For: Past Surgical History  Procedure Laterality Date  . Total hip arthroplasty Left 2000  . Total hip arthroplasty (aka replacement) Right 2011  . Cataract extraction, bilateral  2012  . Retinal laser procedure  2012    retinal wrinkle    His Family History Is Significant For: Family History  Problem Relation Age of Onset  . Heart failure Mother     His Social History Is Significant For: History   Social History  .  Marital Status: Married    Spouse Name: N/A    Number of Children: N/A  . Years of Education: N/A   Social History Main Topics  . Smoking status: Former Smoker    Types: Cigarettes  . Smokeless tobacco: None  . Alcohol Use: 8.4 oz/week    14 Glasses of wine per week  . Drug Use: No  . Sexual Activity: None   Other Topics Concern  . None   Social History Narrative  . None    His Allergies Are:  No Known Allergies:   His Current Medications Are:  Outpatient Encounter Prescriptions as of 05/14/2013  Medication Sig  . amoxicillin (AMOXIL) 500 MG capsule   . Ascorbic Acid (VITAMIN C) 1000 MG tablet Take 1,000 mg by mouth daily.    Marland Kitchen aspirin 81 MG tablet Take 81 mg by mouth daily.    Marland Kitchen atenolol-chlorthalidone (TENORETIC) 50-25 MG per tablet Take 0.5 tablets by mouth daily.    Marland Kitchen buPROPion (WELLBUTRIN XL) 150 MG 24 hr tablet Take 300 mg by mouth daily.    . Cholecalciferol (VITAMIN D) 1000 UNITS capsule Take 1,000 Units by mouth daily.    Marland Kitchen donepezil (ARICEPT) 23 MG TABS tablet Take 1 tablet (23 mg total) by mouth daily.  . dorzolamide-timolol (COSOPT) 22.3-6.8 MG/ML ophthalmic solution   . Flaxseed, Linseed, (FLAXSEED OIL) 1000 MG CAPS Take 1,000 mg by mouth daily.    . folic acid (FOLVITE) 448 MCG tablet Take 400 mcg by mouth daily.    . furosemide (LASIX) 20 MG tablet   . gabapentin (NEURONTIN) 100 MG capsule   . metFORMIN (GLUCOPHAGE) 500 MG tablet   . Multiple Vitamin (MULTIVITAMIN) tablet Take 1 tablet by mouth daily.    . potassium chloride (KLOR-CON) 10 MEQ CR tablet Take 10 mEq by mouth 2 (two) times a week.    . simvastatin (ZOCOR) 40 MG tablet Take 40 mg by mouth daily.    . [DISCONTINUED] donepezil (ARICEPT) 23 MG TABS tablet Take 23 mg by mouth daily.    . [DISCONTINUED] memantine (NAMENDA) 10 MG tablet Take 28 mg by mouth daily.   . Memantine HCl ER (NAMENDA XR) 28 MG CP24 Take 28 mg by mouth daily.  . Omega-3 Fatty Acids (FISH OIL) 1000 MG CAPS Take 1,000 mg by  mouth 3 (three) times daily.    . Omeprazole (PRILOSEC PO) Take by mouth 2 (two) times a week.    . [DISCONTINUED] escitalopram (LEXAPRO) 10 MG tablet   . [DISCONTINUED] KLOR-CON M10 10 MEQ tablet   :  Review of Systems:  Out of a complete 14 point review of systems, all are reviewed and negative with the exception of these symptoms as listed below: Review of Systems  Constitutional: Negative.   HENT: Positive for rhinorrhea.  Eyes: Negative.   Respiratory: Negative.   Cardiovascular: Positive for leg swelling.  Gastrointestinal: Negative.   Endocrine: Negative.   Genitourinary: Negative.   Musculoskeletal: Positive for back pain and gait problem.  Skin: Negative.   Allergic/Immunologic: Negative.   Neurological: Negative.        Memory loss  Hematological: Negative.   Psychiatric/Behavioral: Negative.     Objective:  Neurologic Exam  Physical Exam Physical Examination:   Filed Vitals:   05/14/13 1452  BP: 132/80  Pulse: 60  Temp: 96.5 F (35.8 C)    General Examination: The patient is a very pleasant 80 y.o. male in no acute distress. He is calm and cooperative with the exam. He denies Auditory Hallucinations and Visual Hallucinations.   HEENT: Normocephalic, atraumatic, pupils are equal, round and reactive to light and accommodation. Funduscopic exam is normal with sharp disc margins noted. Extraocular tracking shows mild saccadic breakdown without nystagmus noted. Hearing is intact. Tympanic membranes are clear bilaterally. Face is symmetric with no facial masking and normal facial sensation. There is no lip, neck or jaw tremor. Neck is not rigid with intact passive ROM. There are no carotid bruits on auscultation. Oropharynx exam reveals mild mouth dryness. No significant airway crowding is noted. Mallampati is class I. Tongue protrudes centrally and palate elevates symmetrically.    Chest: is clear to auscultation without wheezing, rhonchi or crackles  noted.  Heart: sounds are regular and normal without murmurs, rubs or gallops noted.   Abdomen: is soft, non-tender and non-distended with normal bowel sounds appreciated on auscultation.  Extremities: There is 1+ pitting edema in the distal lower extremities bilaterally, left worse than right. Pedal pulses are intact.  Skin: is warm and dry with no trophic changes noted. He has multiple scars on his scalp. He has precancerous lesions which are usually taken off by his dermatologist.  Musculoskeletal: exam reveals no obvious joint deformities, tenderness or joint swelling or erythema.  Neurologically:  Mental status: The patient is awake and alert, paying fair  attention. He is able to partially provide the history. His wife provides most of his Hx. He is oriented to: person, place and day of week. His memory, attention, language and knowledge are impaired. There is no aphasia, agnosia, apraxia or anomia. There is a mild degree of bradyphrenia. Speech is mildly hypophonic with no dysarthria noted. Mood is congruent and affect is normal.  On 11/12/12: His MMSE score was 23/30. CDT was 4/4. AFT (Animal Fluency Test) score was 15.  Today, 05/14/13: MMSE was 19/30, CDT 4/4, AFT 15.  Cranial nerves are as described above under HEENT exam. In addition, shoulder shrug is normal with equal shoulder height noted.  Motor exam: Normal bulk, and strength for age is noted. Tone is not rigid with absence of cogwheeling in the extremities. There is overall no significant bradykinesia. There is no drift or rebound. There is a mild postural and action tremor in both upper extremities. There is no significant resting tremor. Romberg is negative. Reflexes are 1+ in the upper extremities and 1+ in the lower extremities. Fine motor skills are mildly impaired bilaterally.   There is no truncal or gait ataxia.   Sensory exam is intact to light touch, but decreased mildly to vibration sense in both feet.   Gait,  station and balance: He stands up from the seated position with mild difficulty and posture is mildly stooped. Stance is narrow-based. He turns in 3 steps. Balance is fairly well preserved.   Assessment  and Plan:   In summary, JAZMINE LONGSHORE is a very pleasant 80 year old male with an underlying medical history of chronic back pain, arthritis, depression, reflux disease, and hypertension who has a long-standing history of dementia, of approximately 15 years duration. His MMSE is a little worse than last time, category fluency and clock drawing are stable. He is on the maximum doses of Aricept and Namenda and did not qualify for the clinical trial for dementia. I suggested we continue with the current medications. He is advised to increase his water intake. He is encouraged to continue to be active physically and mentally. I would be reluctant to switch him to another anti-cholinergic medication for fear of side effects. He and his wife are agreeable to staying with the same regimen. Unfortunately there is another whole lot we can do to alter the course of neurodegenerative diseases which are usually invariably progressive. Thankfully he has a very caring and involved family and a good support system overall and he has been able to tolerate both medications at the maximum doses. He continues to stay active. I again had a long chat with the patient and his about my findings and the diagnosis of memory loss and dementia, its prognosis and treatment options.  As far as further diagnostic testing is concerned, I suggested the following: no change.  As far as medications are concerned, I recommended the following at this time: no change. I renewed his prescriptions for Aricept and Namenda XR.  I answered all their questions today and the patient and his wife were in agreement with the above outlined plan. I would like to see the patient back in 6 months, sooner if the need arises and encouraged them to call with any  interim questions, concerns, problems, updates and refill requests.

## 2013-07-13 ENCOUNTER — Telehealth: Payer: Self-pay | Admitting: Neurology

## 2013-07-13 MED ORDER — GABAPENTIN 100 MG PO CAPS: 100.0000 mg | ORAL_CAPSULE | Freq: Three times a day (TID) | ORAL | Status: DC

## 2013-07-13 NOTE — Telephone Encounter (Signed)
Last OV says no med changes recommended.   We have not gotten anything from the pharmacy.  I called them and apparently they were trying to send the electronic requests under Jose [name] name, which likely rejected since he is not an active provider any longer.  Rx has been sent.  I spoke with Jose [name].  She is aware.

## 2013-07-13 NOTE — Telephone Encounter (Signed)
Patient's wife calling to request patient's Gabapentin refill, states that the pharmacy has faxed [place] twice already and hasn't heard anything back yet. Please call patient and advise.

## 2013-08-03 ENCOUNTER — Encounter: Payer: Self-pay | Admitting: Podiatry

## 2013-08-03 ENCOUNTER — Ambulatory Visit: Payer: Medicare Other | Admitting: Podiatry

## 2013-08-03 ENCOUNTER — Ambulatory Visit (INDEPENDENT_AMBULATORY_CARE_PROVIDER_SITE_OTHER): Payer: Medicare Other | Admitting: Podiatry

## 2013-08-03 VITALS — BP 110/67 | HR 64 | Resp 16

## 2013-08-03 DIAGNOSIS — M79609 Pain in unspecified limb: Secondary | ICD-10-CM

## 2013-08-03 DIAGNOSIS — B351 Tinea unguium: Secondary | ICD-10-CM

## 2013-08-03 NOTE — Progress Notes (Signed)
Subjective:     Patient ID: Jose [name], male   DOB: [birth year], 80 y.o.   MRN: [id number]  HPI patient presents with nail thickness in pain 1-5 both feet that he cannot cut himself   Review of Systems     Objective:   Physical Exam Neurovascular status intact with thick nailbeds 1-5 both feet that are painful    Assessment:     Mycotic nail infection with pain 1-5 both feet    Plan:     Debridement painful nailbeds 1-5 both feet with no bleeding noted

## 2013-11-09 ENCOUNTER — Ambulatory Visit (INDEPENDENT_AMBULATORY_CARE_PROVIDER_SITE_OTHER): Payer: Medicare Other | Admitting: Podiatry

## 2013-11-09 DIAGNOSIS — B351 Tinea unguium: Secondary | ICD-10-CM

## 2013-11-09 DIAGNOSIS — M79673 Pain in unspecified foot: Secondary | ICD-10-CM

## 2013-11-09 DIAGNOSIS — M79609 Pain in unspecified limb: Secondary | ICD-10-CM

## 2013-11-09 NOTE — Progress Notes (Signed)
Subjective:     Patient ID: Jose [name], male   DOB: 1930-08-19, 80 y.o.   MRN: [id number]  HPI patient presents with thick yellow brittle nailbeds 1-5 both feet that are painful   Review of Systems     Objective:   Physical Exam Neurovascular status unchanged with thick yellow brittle nailbeds 1-5 both feet    Assessment:     Mycotic nail infection with pain 1-5 both feet    Plan:     Debris painful nailbeds 1-5 both feet with no iatrogenic bleeding noted

## 2013-11-11 ENCOUNTER — Other Ambulatory Visit: Payer: Self-pay

## 2013-11-12 ENCOUNTER — Encounter: Payer: Self-pay | Admitting: Neurology

## 2013-11-12 ENCOUNTER — Ambulatory Visit (INDEPENDENT_AMBULATORY_CARE_PROVIDER_SITE_OTHER): Payer: Medicare Other | Admitting: Neurology

## 2013-11-12 VITALS — BP 130/72 | HR 62 | Temp 97.0°F | Ht 68.0 in | Wt 179.0 lb

## 2013-11-12 DIAGNOSIS — M545 Low back pain, unspecified: Secondary | ICD-10-CM

## 2013-11-12 DIAGNOSIS — G609 Hereditary and idiopathic neuropathy, unspecified: Secondary | ICD-10-CM

## 2013-11-12 DIAGNOSIS — F0390 Unspecified dementia without behavioral disturbance: Secondary | ICD-10-CM

## 2013-11-12 MED ORDER — DONEPEZIL HCL 23 MG PO TABS: 23.0000 mg | ORAL_TABLET | Freq: Every day | ORAL | Status: DC

## 2013-11-12 MED ORDER — GABAPENTIN 100 MG PO CAPS: 100.0000 mg | ORAL_CAPSULE | Freq: Two times a day (BID) | ORAL | Status: DC

## 2013-11-12 MED ORDER — MEMANTINE HCL ER 28 MG PO CP24: 28.0000 mg | ORAL_CAPSULE | Freq: Every day | ORAL | Status: DC

## 2013-11-12 NOTE — Progress Notes (Signed)
Subjective:    Patient ID: Jose [name] is a 80 y.o. male.  HPI    Interim history:   Jose [name] is a very friendly 80 year old right-handed gentleman with an underlying medical history of left eye retinal disease, arthritis and status post L THR in 2000, R THR in 2011, ET with a positive FHx, PN, HLP, HTN, LBP, depression, reflux disease, and memory loss, who presents for followup consultation of his dementia without behavioral disturbance. He is accompanied by his wife again today. I last saw him on 05/14/2013, at which time I did not make any medication changes. He was doing well per wife. He continued to be active. I did encourage him to drink more water. I kept him on long-acting Namenda 28 mg and Aricept 23 mg daily.  Today, she reports that he sleeps more and is not very motivated to do things. He does less around the house. He fell once as he held onto the towel rack and tore the rack off the wall and bumped his head. No laceration, no HA, no LOC, no injuries.   I first met him on 11/12/2012, which time I felt that his MMSE was a little worse and his category fluency was also less than before. His MMSE score was 23/30, CDT was 4/4, AFT (Animal Fluency Test) score was 15. He has been on maximum doses of Aricept and Namenda and he did not qualify for the clinical trial for dementia. I was reluctant to switch him to another anti-cholinergic medication for fear of side effects.  He previously followed with Dr. Morene Antu and was last seen by him on 05/14/2012, which time Dr. Erling Cruz felt that he was a potential candidate for a clinical trial. The patient's MMSE score was 26, clock drawing was 4 out of 4, and his falls assessment tool score was 12.  He started having progressive memory loss in April 2000. This was after his hip surgery. His second hip surgery was done under spinal anesthesia with good results. EMG and nerve conduction studies as well as lumbar spine MRI in November 2012 showed  multilevel spondylosis, as moderately severe canal stenosis and broad-based disc bulge at L1-2. B12 level, RPR, TSH were normal. MRI brain with and without contrast on February 2004 showed atrophy and small vessel disease. He had a repeat brain MRI in October 2012. He has been on Aricept since 2004 and [place] since March 2005. He also has peripheral neuropathy for which he had evaluation in January 2009 with SPEP, ACE level, vitamin B12, hemoglobin A1c, PSA, TSH, lipids, CBC, CMP, urinalysis, all normal in February 2010. In December 2012 his MMSE was 51, and in April 2013 his MMSE was 22, clock drawing was 4, animal fluency was 18.  He was started on low dose Lexapro by Dr. Joylene Draft. The patient has had a tendency to pick at his skin lesions on the scalp. His son and daughter are local and involved. 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 He no longer drives. He goes for aqua exercises 3 times/week. He has had no VH/AH. Eating too much and forgetting that he ate has been an issue per wife. He had a sleep study in the past in 2013, which was negative for OSA per wife.      His Past Medical History Is Significant For: Past Medical History  Diagnosis Date  . Memory loss   . 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 Spinal stenosis,  lumbar region, without neurogenic claudication   . Abnormality of gait   . Backache, unspecified   . Unspecified hereditary and idiopathic peripheral neuropathy   . Pain in joint, pelvic region and thigh   . Essential and other specified forms of tremor   . Dementia 11/12/2012  . History of bilateral hip replacements 11/12/2012    His Past Surgical History Is Significant For: Past Surgical History  Procedure Laterality Date  . Total hip arthroplasty Left 2000  . Total hip arthroplasty (aka replacement) Right 2011  . Cataract extraction, bilateral  2012  . Retinal laser procedure  2012    retinal wrinkle    His Family History Is  Significant For: Family History  Problem Relation Age of Onset  . Heart failure Mother     His Social History Is Significant For: History   Social History  . Marital Status: Married    Spouse Name: [name]    Number of Children: 2  . Years of Education: lawyer   Occupational History  .      retired   Social History Main Topics  . Smoking status: Former Smoker    Types: Cigarettes  . Smokeless tobacco: None  . Alcohol Use: 8.4 oz/week    14 Glasses of wine per week     Comment: occas, one glass of wine before dinner,sometimes 1/2 glass  . Drug Use: No  . Sexual Activity: None   Other Topics Concern  . None   Social History Narrative   Patient is right handed and resides in home with wife    His Allergies Are:  No Known Allergies:   His Current Medications Are:  Outpatient Encounter Prescriptions as of 11/12/2013  Medication Sig  . amoxicillin (AMOXIL) 500 MG capsule 4 capsules before going to dentist  . Ascorbic Acid (VITAMIN C) 1000 MG tablet Take 1,000 mg by mouth daily.    Marland Kitchen aspirin 81 MG tablet Take 81 mg by mouth daily.    Marland Kitchen atenolol-chlorthalidone (TENORETIC) 50-25 MG per tablet Take 0.5 tablets by mouth daily.    Marland Kitchen buPROPion (WELLBUTRIN XL) 150 MG 24 hr tablet Take 300 mg by mouth daily.    . Cholecalciferol (VITAMIN D) 1000 UNITS capsule Take 1,000 Units by mouth daily.    Marland Kitchen donepezil (ARICEPT) 23 MG TABS tablet Take 1 tablet (23 mg total) by mouth daily.  . dorzolamide-timolol (COSOPT) 22.3-6.8 MG/ML ophthalmic solution   . Flaxseed, Linseed, (FLAXSEED OIL) 1000 MG CAPS Take 1,000 mg by mouth daily.    . folic acid (FOLVITE) 791 MCG tablet Take 400 mcg by mouth daily.    Marland Kitchen gabapentin (NEURONTIN) 100 MG capsule Take 1 capsule (100 mg total) by mouth 3 (three) times daily.  . Memantine HCl ER (NAMENDA XR) 28 MG CP24 Take 28 mg by mouth daily.  . metFORMIN (GLUCOPHAGE) 500 MG tablet   . mirabegron ER (MYRBETRIQ) 50 MG TB24 tablet Take 50 mg by mouth daily.  .  Multiple Vitamin (MULTIVITAMIN) tablet Take 1 tablet by mouth daily.    . potassium chloride (KLOR-CON) 10 MEQ CR tablet Take 10 mEq by mouth 2 (two) times a week.    . simvastatin (ZOCOR) 40 MG tablet Take 40 mg by mouth daily.    . [DISCONTINUED] furosemide (LASIX) 20 MG tablet   . [DISCONTINUED] Omega-3 Fatty Acids (FISH OIL) 1000 MG CAPS Take 1,000 mg by mouth 3 (three) times daily.    . [DISCONTINUED] Omeprazole (PRILOSEC PO) Take by mouth 2 (two)  times a week.    :  Review of Systems:  Out of a complete 14 point review of systems, all are reviewed and negative with the exception of these symptoms as listed below:  Review of Systems  Constitutional: Positive for activity change.  Eyes: Positive for itching.  Cardiovascular: Positive for leg swelling.  Genitourinary:       Diarrhea,incontinence of bladder  Musculoskeletal: Positive for back pain and gait problem.  Skin:       itching  Neurological:       Daytime sleepiness, memory loss    Objective:  Neurologic Exam  Physical Exam Physical Examination:   Filed Vitals:   11/12/13 1205  BP: 130/72  Pulse: 62  Temp: 97 F (36.1 C)    General Examination: The patient is a very pleasant 80 y.o. male in no acute distress. He is calm and cooperative with the exam. He denies Auditory Hallucinations and Visual Hallucinations. He is in good spirits today.   HEENT: Normocephalic, atraumatic, pupils are equal, round and reactive to light and accommodation. Funduscopic exam is normal with sharp disc margins noted. Extraocular tracking shows mild saccadic breakdown without nystagmus noted. Hearing is intact. Tympanic membranes are clear bilaterally. Face is symmetric with no facial masking and normal facial sensation. There is no lip, neck or jaw tremor. Neck is not rigid with intact passive ROM. There are no carotid bruits on auscultation. Oropharynx exam reveals mild to moderate mouth dryness. No significant airway crowding is noted.  Mallampati is class I. Tongue protrudes centrally and palate elevates symmetrically.    Chest: is clear to auscultation without wheezing, rhonchi or crackles noted.  Heart: sounds are regular and normal without murmurs, rubs or gallops noted.   Abdomen: is soft, non-tender and non-distended with normal bowel sounds appreciated on auscultation.  Extremities: There is trace pitting edema in the distal lower extremities bilaterally, left worse than right. Pedal pulses are intact.  Skin: is warm and dry with no trophic changes noted. He has multiple scars on his scalp. He has precancerous lesions which are usually taken off by his dermatologist.  Musculoskeletal: exam reveals no obvious joint deformities, tenderness or joint swelling or erythema.  Neurologically:  Mental status: The patient is awake and alert, paying fair attention. He is able to partially provide the history. His wife provides most of his Hx. He is oriented to: person, place and day of week. His memory, attention, language and knowledge are impaired. There is no aphasia, agnosia, apraxia or anomia. There is a mild degree of bradyphrenia. Speech is mildly hypophonic with no dysarthria noted. Mood is congruent and affect is normal.   On 11/12/12: His MMSE score was 23/30. CDT was 4/4. AFT (Animal Fluency Test) score was 15.  On 05/14/13: MMSE was 19/30, CDT 4/4, AFT 15. On 11/12/2013: MMSE is 20/30, CDT 4/4, AFT 16.   Cranial nerves are as described above under HEENT exam. In addition, shoulder shrug is normal with equal shoulder height noted.  Motor exam: Normal bulk, and strength for age is noted. Tone is not rigid with absence of cogwheeling in the extremities. There is overall no significant bradykinesia. There is no drift or rebound. There is a mild postural and action tremor in both upper extremities. There is no significant resting tremor. Romberg is negative. Reflexes are 1+ in the upper extremities and 1+ in the lower  extremities. Fine motor skills are mildly impaired bilaterally.   There is no truncal or gait ataxia.  Sensory exam is intact to light touch, but decreased mildly to vibration, PP and temperature sense in both feet.   Gait, station and balance: He stands up from the seated position with mild difficulty and posture is mildly stooped. Stance is narrow-based. He turns in 3 steps. Balance is fairly well preserved. He uses his cane barely.  Assessment and Plan:   In summary, Jose [name] is a very pleasant 80 year old male with an underlying medical history of chronic back pain, arthritis, depression, reflux disease, and hypertension who has a long-standing history of dementia, of approximately 16 years duration. His memory numbers are actually stable from 6 months ago and I think he has done fairly well on a combination of long-acting Namenda 20 mg and Aricept 23 mg daily, tolerating medications well. He is on the maximum doses of Aricept and Namenda and did not qualify for the clinical trial for dementia in the past. I suggested we continue with the current medications. He is advised to increase his water intake. He is encouraged to continue to be active physically and mentally. I would be reluctant to switch him to another anti-cholinergic medication for fear of side effects. He and his wife are agreeable to staying with the same regimen and I think it is reassuring that for the past year or so he has been fairly stable memory-wise. Unfortunately there is not a whole lot we can do to alter the course of neurodegenerative diseases which are usually invariably progressive. Thankfully he has a very caring and involved family and a good support system overall and he has been able to tolerate both medications at the maximum doses. He goes to the gym 3 times a week I again had a long chat with the patient and his wife about my findings and the diagnosis of memory loss and dementia, its prognosis and treatment  options.  As far as further diagnostic testing is concerned, I suggested the following: no change.  As far as medications are concerned, I recommended the following at this time: no change. I renewed his prescriptions for Aricept and Namenda XR and neurontin 100 mg bid.  I answered all their questions today and the patient and his wife were in agreement with the above outlined plan. I would like to see the patient back in 6 months, sooner if the need arises and encouraged them to call with any interim questions, concerns, problems, updates and refill requests.

## 2013-11-12 NOTE — Patient Instructions (Signed)
We will keep you on the same medications.

## 2014-02-15 ENCOUNTER — Encounter: Payer: Self-pay | Admitting: Podiatry

## 2014-02-15 ENCOUNTER — Ambulatory Visit (INDEPENDENT_AMBULATORY_CARE_PROVIDER_SITE_OTHER): Payer: Medicare Other | Admitting: Podiatry

## 2014-02-15 DIAGNOSIS — M79673 Pain in unspecified foot: Secondary | ICD-10-CM

## 2014-02-15 DIAGNOSIS — B351 Tinea unguium: Secondary | ICD-10-CM

## 2014-02-15 NOTE — Progress Notes (Signed)
Subjective:     Patient ID: Jose [name], male   DOB: April 06, 1930, 80 y.o.   MRN: [id number]  HPI patient presents with thick yellow brittle nailbeds 1-5 both feet that are painful   Review of Systems     Objective:   Physical Exam Neurovascular status unchanged with thick yellow brittle nailbeds 1-5 both feet    Assessment:     Mycotic nail infection with pain 1-5 both feet    Plan:     Debris painful nailbeds 1-5 both feet with no iatrogenic bleeding noted

## 2014-02-21 ENCOUNTER — Encounter (HOSPITAL_COMMUNITY): Payer: Self-pay | Admitting: Emergency Medicine

## 2014-02-21 ENCOUNTER — Emergency Department (HOSPITAL_COMMUNITY)
Admission: EM | Admit: 2014-02-21 | Discharge: 2014-02-21 | Disposition: A | Discharge status: Home / Self Care | Payer: Medicare Other | Attending: Emergency Medicine | Admitting: Emergency Medicine

## 2014-02-21 DIAGNOSIS — G609 Hereditary and idiopathic neuropathy, unspecified: Secondary | ICD-10-CM | POA: Diagnosis not present

## 2014-02-21 DIAGNOSIS — F0390 Unspecified dementia without behavioral disturbance: Secondary | ICD-10-CM | POA: Insufficient documentation

## 2014-02-21 DIAGNOSIS — Z79899 Other long term (current) drug therapy: Secondary | ICD-10-CM | POA: Diagnosis not present

## 2014-02-21 DIAGNOSIS — Z87891 Personal history of nicotine dependence: Secondary | ICD-10-CM | POA: Insufficient documentation

## 2014-02-21 DIAGNOSIS — Z86010 Personal history of colonic polyps: Secondary | ICD-10-CM | POA: Diagnosis not present

## 2014-02-21 DIAGNOSIS — K625 Hemorrhage of anus and rectum: Secondary | ICD-10-CM | POA: Diagnosis present

## 2014-02-21 DIAGNOSIS — Z7982 Long term (current) use of aspirin: Secondary | ICD-10-CM | POA: Insufficient documentation

## 2014-02-21 DIAGNOSIS — Z8739 Personal history of other diseases of the musculoskeletal system and connective tissue: Secondary | ICD-10-CM | POA: Diagnosis not present

## 2014-02-21 LAB — POC OCCULT BLOOD, ED: Fecal Occult Bld: POSITIVE — AB

## 2014-02-21 LAB — COMPREHENSIVE METABOLIC PANEL
ALT: 31 U/L (ref 0–53)
AST: 25 U/L (ref 0–37)
Albumin: 3.5 g/dL (ref 3.5–5.2)
Alkaline Phosphatase: 74 U/L (ref 39–117)
Anion gap: 11 (ref 5–15)
BILIRUBIN TOTAL: 0.3 mg/dL (ref 0.3–1.2)
BUN: 29 mg/dL — ABNORMAL HIGH (ref 6–23)
CHLORIDE: 102 meq/L (ref 96–112)
CO2: 28 meq/L (ref 19–32)
Calcium: 9.8 mg/dL (ref 8.4–10.5)
Creatinine, Ser: 1.21 mg/dL (ref 0.50–1.35)
GFR calc Af Amer: 63 mL/min — ABNORMAL LOW (ref >90)
GFR, EST NON AFRICAN AMERICAN: 54 mL/min — AB (ref >90)
Glucose, Bld: 114 mg/dL — ABNORMAL HIGH (ref 70–99)
POTASSIUM: 4.7 meq/L (ref 3.7–5.3)
SODIUM: 141 meq/L (ref 137–147)
Total Protein: 6.7 g/dL (ref 6.0–8.3)

## 2014-02-21 LAB — CBC
HEMATOCRIT: 41.9 % (ref 39.0–52.0)
Hemoglobin: 14.7 g/dL (ref 13.0–17.0)
MCH: 33.9 pg (ref 26.0–34.0)
MCHC: 35.1 g/dL (ref 30.0–36.0)
MCV: 96.8 fL (ref 78.0–100.0)
Platelets: 123 10*3/uL — ABNORMAL LOW (ref 150–400)
RBC: 4.33 MIL/uL (ref 4.22–5.81)
RDW: 12.6 % (ref 11.5–15.5)
WBC: 8.8 10*3/uL (ref 4.0–10.5)

## 2014-02-21 NOTE — ED Notes (Signed)
MD at bedside. 

## 2014-02-21 NOTE — ED Notes (Signed)
Pt had BM this morning, pt reports moderate amount of bright red blood. Pt stool not noted to black. Pr denies any pain or n/v, Wife states pt has been very weak this morning.

## 2014-02-21 NOTE — Discharge Instructions (Signed)
As discussed, it is important that you follow up as soon as possible with your physician for continued management of your condition. ° °If you develop any new, or concerning changes in your condition, please return to the emergency department immediately. ° °Bloody Stools °Bloody stools often mean that there is a problem in the digestive tract. Your caregiver may use the term "melena" to describe black, tarry, and bad smelling stools or "hematochezia" to describe red or maroon-colored stools. Blood seen in the stool can be caused by bleeding anywhere along the intestinal tract.  °A black stool usually means that blood is coming from the upper part of the gastrointestinal tract (esophagus, stomach, or small bowel). Passing maroon-colored stools or bright red blood usually means that blood is coming from lower down in the large bowel or the rectum. However, sometimes massive bleeding in the stomach or small intestine can cause bright red bloody stools.  °Consuming black licorice, lead, iron pills, medicines containing bismuth subsalicylate, or blueberries can also cause black stools. Your caregiver can test black stools to see if blood is present. °It is important that the cause of the bleeding be found. Treatment can then be started, and the problem can be corrected. Rectal bleeding may not be serious, but you should not assume everything is okay until you know the cause. It is very important to follow up with your caregiver or a specialist in gastrointestinal problems. °CAUSES  °Blood in the stools can come from various underlying causes. Often, the cause is not found during your first visit. Testing is often needed to discover the cause of bleeding in the gastrointestinal tract. Causes range from simple to serious or even life-threatening. Possible causes include: °· Hemorrhoids. These are veins that are full of blood (engorged) in the rectum. They cause pain, inflammation, and may bleed. °· Anal fissures. These  are areas of painful tearing which may bleed. They are often caused by passing hard stool. °· Diverticulosis. These are pouches that form on the colon over time, with age, and may bleed significantly. °· Diverticulitis. This is inflammation in areas with diverticulosis. It can cause pain, fever, and bloody stools, although bleeding is rare. °· Proctitis and colitis. These are inflamed areas of the rectum or colon. They may cause pain, fever, and bloody stools. °· Polyps and cancer. Colon cancer is a leading cause of preventable cancer death. It often starts out as precancerous polyps that can be removed during a colonoscopy, preventing progression into cancer. Sometimes, polyps and cancer may cause rectal bleeding. °· Gastritis and ulcers. Bleeding from the upper gastrointestinal tract (near the stomach) may travel through the intestines and produce black, sometimes tarry, often bad smelling stools. In certain cases, if the bleeding is fast enough, the stools may not be black, but red and the condition may be life-threatening. °SYMPTOMS  °You may have stools that are bright red and bloody, that are normal color with blood on them, or that are dark black and tarry. In some cases, you may only have blood in the toilet bowl. Any of these cases need medical care. You may also have: °· Pain at the anus or anywhere in the rectum. °· Lightheadedness or feeling faint. °· Extreme weakness. °· Nausea or vomiting. °· Fever. °DIAGNOSIS °Your caregiver may use the following methods to find the cause of your bleeding: °· Taking a medical history. Age is important. Older people tend to develop polyps and cancer more often. If there is anal pain and a hard, large stool associated with bleeding, a tear of the anus   may be the cause. If blood drips into the toilet after a bowel movement, bleeding hemorrhoids may be the problem. The color and frequency of the bleeding are additional considerations. In most cases, the medical history  provides clues, but seldom the final answer. °· A visual and finger (digital) exam. Your caregiver will inspect the anal area, looking for tears and hemorrhoids. A finger exam can provide information when there is tenderness or a growth inside. In men, the prostate is also examined. °· Endoscopy. Several types of small, long scopes (endoscopes) are used to view the colon. °¨ In the office, your caregiver may use a rigid, or more commonly, a flexible viewing sigmoidoscope. This exam is called flexible sigmoidoscopy. It is performed in 5 to 10 minutes. °¨ A more thorough exam is accomplished with a colonoscope. It allows your caregiver to view the entire 5 to 6 foot long colon. Medicine to help you relax (sedative) is usually given for this exam. Frequently, a bleeding lesion may be present beyond the reach of the sigmoidoscope. So, a colonoscopy may be the best exam to start with. Both exams are usually done on an outpatient basis. This means the patient does not stay overnight in the hospital or surgery center. °¨ An upper endoscopy may be needed to examine your stomach. Sedation is used and a flexible endoscope is put in your mouth, down to your stomach. °· A barium enema X-ray. This is an X-ray exam. It uses liquid barium inserted by enema into the rectum. This test alone may not identify an actual bleeding point. X-rays highlight abnormal shadows, such as those made by lumps (tumors), diverticuli, or colitis. °TREATMENT  °Treatment depends on the cause of your bleeding.  °· For bleeding from the stomach or colon, the caregiver doing your endoscopy or colonoscopy may be able to stop the bleeding as part of the procedure. °· Inflammation or infection of the colon can be treated with medicines. °· Many rectal problems can be treated with creams, suppositories, or warm baths. °· Surgery is sometimes needed. °· Blood transfusions are sometimes needed if you have lost a lot of blood. °· For any bleeding problem, let  your caregiver know if you take aspirin or other blood thinners regularly. °HOME CARE INSTRUCTIONS  °· Take any medicines exactly as prescribed. °· Keep your stools soft by eating a diet high in fiber. Prunes (1 to 3 a day) work well for many people. °· Drink enough water and fluids to keep your urine clear or pale yellow. °· Take sitz baths if advised. A sitz bath is when you sit in a bathtub with warm water for 10 to 15 minutes to soak, soothe, and cleanse the rectal area. °· If enemas or suppositories are advised, be sure you know how to use them. Tell your caregiver if you have problems with this. °· Monitor your bowel movements to look for signs of improvement or worsening. °SEEK MEDICAL CARE IF:  °· You do not improve in the time expected. °· Your condition worsens after initial improvement. °· You develop any new symptoms. °SEEK IMMEDIATE MEDICAL CARE IF:  °· You develop severe or prolonged rectal bleeding. °· You vomit blood. °· You feel weak or faint. °· You have a fever. °MAKE SURE YOU: °· Understand these instructions. °· Will watch your condition. °· Will get help right away if you are not doing well or get worse. °Document Released: 03/09/2002 Document Revised: 06/11/2011 Document Reviewed: 08/04/2010 °[employer]® Patient Information ©2015 [employer].   This information is not intended to replace advice given to you by your health care provider. Make sure you discuss any questions you have with your health care provider. ° °

## 2014-02-21 NOTE — ED Provider Notes (Signed)
CSN: [id number]     Arrival date & time 02/21/14  1021 History   First MD Initiated Contact with Patient 02/21/14 1031     Chief Complaint  Patient presents with  . Rectal Bleeding     HPI  Patient presents with concern of rectal bleeding. Patient has dementia. Level V caveat. Patient himself denies pain.  Wife states that over the past day patient has had bright red blood in his underwear, and today had a substantial amount of blood in the toilet bowl after a bowel movement. She states that the patient has baseline unsteadiness, and this seems more pronounced. She denies syncope, patient complains of chest pain, dyspnea, fever, chills or other new changes.   Past Medical History  Diagnosis Date  . Memory loss   . 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 Spinal stenosis, lumbar region, without neurogenic claudication   . Abnormality of gait   . Backache, unspecified   . Unspecified hereditary and idiopathic peripheral neuropathy   . Pain in joint, pelvic region and thigh   . Essential and other specified forms of tremor   . Dementia 11/12/2012  . History of bilateral hip replacements 11/12/2012   Past Surgical History  Procedure Laterality Date  . Total hip arthroplasty Left 2000  . Total hip arthroplasty (aka replacement) Right 2011  . Cataract extraction, bilateral  2012  . Retinal laser procedure  2012    retinal wrinkle   Family History  Problem Relation Age of Onset  . Heart failure Mother    History  Substance Use Topics  . Smoking status: Former Smoker    Types: Cigarettes  . Smokeless tobacco: Not on file  . Alcohol Use: 8.4 oz/week    14 Glasses of wine per week     Comment: occas, one glass of wine before dinner,sometimes 1/2 glass    Review of Systems  Unable to perform ROS: Dementia      Allergies  Review of patient's allergies indicates no known allergies.  Home Medications   Prior to Admission medications    Medication Sig Start Date End Date Taking? Authorizing Provider  amoxicillin (AMOXIL) 500 MG capsule 4 capsules before going to dentist 04/21/13  Yes Historical Provider, MD  Ascorbic Acid (VITAMIN C) 1000 MG tablet Take 1,000 mg by mouth daily.     Yes Historical Provider, MD  aspirin 81 MG tablet Take 81 mg by mouth daily.     Yes Historical Provider, MD  atenolol-chlorthalidone (TENORETIC) 50-25 MG per tablet Take 0.5 tablets by mouth daily.     Yes Historical Provider, MD  buPROPion (WELLBUTRIN SR) 150 MG 12 hr tablet Take 150 mg by mouth 2 (two) times daily. 01/27/14  Yes Historical Provider, MD  Cholecalciferol (VITAMIN D) 1000 UNITS capsule Take 1,000 Units by mouth daily.     Yes Historical Provider, MD  donepezil (ARICEPT) 23 MG TABS tablet Take 1 tablet (23 mg total) by mouth daily. 11/12/13  Yes Star Age, MD  dorzolamide-timolol (COSOPT) 22.3-6.8 MG/ML ophthalmic solution Place 1 drop into the left eye 2 (two) times daily.  09/20/12  Yes Historical Provider, MD  Flaxseed, Linseed, (FLAXSEED OIL) 1000 MG CAPS Take 1,000 mg by mouth daily.     Yes Historical Provider, MD  folic acid (FOLVITE) 470 MCG tablet Take 400 mcg by mouth every evening.    Yes Historical Provider, MD  gabapentin (NEURONTIN) 100 MG capsule Take 1 capsule (100 mg total) by mouth  2 (two) times daily. 11/12/13  Yes Star Age, MD  Memantine HCl ER (NAMENDA XR) 28 MG CP24 Take 28 mg by mouth daily. 11/12/13  Yes Star Age, MD  metFORMIN (GLUCOPHAGE) 500 MG tablet Take 500 mg by mouth daily with breakfast.  10/25/12  Yes Historical Provider, MD  mirabegron ER (MYRBETRIQ) 50 MG TB24 tablet Take 50 mg by mouth daily.   Yes Historical Provider, MD  Multiple Vitamin (MULTIVITAMIN) tablet Take 1 tablet by mouth daily.     Yes Historical Provider, MD  potassium chloride (KLOR-CON) 10 MEQ CR tablet Take 10 mEq by mouth 2 (two) times a week.     Yes Historical Provider, MD  simvastatin (ZOCOR) 40 MG tablet Take 40 mg by mouth  every evening.    Yes Historical Provider, MD   BP 117/103 mmHg  Pulse 72  Temp(Src) 98 F (36.7 C) (Oral)  Resp 20  SpO2 100% Physical Exam  Constitutional: He is oriented to person, place, and time. He appears well-developed. No distress.  HENT:  Head: Normocephalic and atraumatic.  Eyes: Conjunctivae and EOM are normal.  Cardiovascular: Normal rate and regular rhythm.   Pulmonary/Chest: Effort normal. No stridor. No respiratory distress.  Abdominal: He exhibits no distension.  Genitourinary:  Hemorrhoids, no active bleeding, brown/red blood on exam.   Musculoskeletal: He exhibits no edema.  Neurological: He is alert and oriented to person, place, and time.  Skin: Skin is warm and dry.  Psychiatric: His affect is blunt. He is withdrawn. Cognition and memory are impaired.  Nursing note and vitals reviewed.   ED Course  Procedures (including critical care time) Labs Review Labs Reviewed  COMPREHENSIVE METABOLIC PANEL - Abnormal; Notable for the following:    Glucose, Bld 114 (*)    BUN 29 (*)    GFR calc non Af Amer 54 (*)    GFR calc Af Amer 63 (*)    All other components within normal limits  CBC - Abnormal; Notable for the following:    Platelets 123 (*)    All other components within normal limits  POC OCCULT BLOOD, ED - Abnormal; Notable for the following:    Fecal Occult Bld POSITIVE (*)    All other components within normal limits    12:31 PM Patient in no distress.  Patient states that he is hungry. I discussed all findings with patient and his wife.  She states that she will follow up tomorrow with primary care and gastroenterology.  Previously, the patient has had colonoscopy deferred given his history of dementia, age. She voices an understanding of return precautions.  MDM   Final diagnoses:  Rectal bleeding    Patient with dementia presents with painless rectal bleeding.  Patient is hemodynamically stable, neurologically intact aside from dementia,  and labs do not demonstrate substantial abnormalities.  Patient is in no distress.  Patient was discharged in stable condition to follow-up with gastroenterology tomorrow.    Carmin Muskrat, MD 02/21/14 [phone]

## 2014-02-22 ENCOUNTER — Telehealth: Payer: Self-pay | Admitting: Internal Medicine

## 2014-02-22 NOTE — Telephone Encounter (Signed)
Pt was seen in the ER yesterday with rectal bleeding and told to follow-up with his PCP and GI doctor. Pts wife called PCP and was told to call GI. Pt scheduled to see Dr. Henrene Pastor tomorrow at 8:30am. Pts wife aware of appt.

## 2014-02-23 ENCOUNTER — Ambulatory Visit (INDEPENDENT_AMBULATORY_CARE_PROVIDER_SITE_OTHER): Payer: Medicare Other | Admitting: Internal Medicine

## 2014-02-23 ENCOUNTER — Encounter: Payer: Self-pay | Admitting: Internal Medicine

## 2014-02-23 VITALS — BP 110/74 | HR 68 | Ht 69.0 in | Wt 184.6 lb

## 2014-02-23 DIAGNOSIS — K625 Hemorrhage of anus and rectum: Secondary | ICD-10-CM

## 2014-02-23 DIAGNOSIS — K648 Other hemorrhoids: Secondary | ICD-10-CM

## 2014-02-23 MED ORDER — HYDROCORTISONE ACETATE 25 MG RE SUPP: 25.0000 mg | Freq: Every day | RECTAL | Status: DC

## 2014-02-23 NOTE — Progress Notes (Signed)
HISTORY OF PRESENT ILLNESS:  Jose [name] is a 80 y.o. male with multiple medical problems as listed below. He is worked into today's office schedule after being referred to the office by the emergency room regarding rectal bleeding. The patient has not been seen here since 2008. The patient is accompanied by his wife. She noticed stains of red blood in his undergarments. She asked him if he had been bleeding. He said yes and showed her a bowel movement in the toilet bowl which was round and formed. However, there was bright red blood staining the toilet water. Patient denies constipation or abdominal pain. No rectal pain. Evaluation the emergency room revealed brown stool with flecks of blood on rectal exam. Laboratories were remarkable for normal hemoglobin of 14.7. The patient does not take blood thinners though he is on aspirin. GI review of systems is otherwise negative. Colonoscopy performed 09/26/2006 revealed left-sided diverticulosis, internal hemorrhoids, and diminutive colon polyps (adenomas) which were removed.  REVIEW OF SYSTEMS:  All non-GI ROS negative except for memory deficit  Past Medical History  Diagnosis Date  . Memory loss   . 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 Spinal stenosis, lumbar region, without neurogenic claudication   . Abnormality of gait   . Backache, unspecified   . Unspecified hereditary and idiopathic peripheral neuropathy   . Pain in joint, pelvic region and thigh   . Essential and other specified forms of tremor   . Dementia 11/12/2012  . History of bilateral hip replacements 11/12/2012  . Colon polyps     adenomatous  . Hemorrhoids   . Diverticulosis     Past Surgical History  Procedure Laterality Date  . Total hip arthroplasty Left 2000  . Total hip arthroplasty (aka replacement) Right 2011  . Cataract extraction, bilateral  2012  . Retinal laser procedure  2012    retinal wrinkle    Social  History Jose [name]  reports that he has quit smoking. His smoking use included Cigarettes. He smoked 0.00 packs per day. He has never used smokeless tobacco. He reports that he drinks about 8.4 oz of alcohol per week. He reports that he does not use illicit drugs.  family history includes Heart failure in his mother.  No Known Allergies     PHYSICAL EXAMINATION: Vital signs: BP 110/74 mmHg  Pulse 68  Ht 5\' 9"  (1.753 m)  Wt 184 lb 9.6 oz (83.734 kg)  BMI 27.25 kg/m2  Constitutional: generally well-appearing, no acute distress Psychiatric: alert and oriented x3, cooperative Eyes: extraocular movements intact, anicteric, conjunctiva pink Mouth: oral pharynx moist, no lesions Neck: supple no lymphadenopathy Cardiovascular: heart regular rate and rhythm, no murmur Lungs: clear to auscultation bilaterally Abdomen: soft, nontender, nondistended, no obvious ascites, no peritoneal signs, normal bowel sounds, no organomegaly Rectal: Noninflamed external hemorrhoids. Inflamed internal hemorrhoids. Brown stool. No tenderness Extremities: no lower extremity edema bilaterally Skin: no lesions on visible extremities Neuro: No focal deficits.  ASSESSMENT:  #1. Rectal bleeding secondary to internal hemorrhoids #2. Colonoscopy 2008 demonstrating diverticulosis, hemorrhoids, and diminutive polyps  PLAN:  #1. Prescribe Anusol HC suppositories, 1 per rectum at night as needed #2. Dietary fiber supplementation #3. Resume general medical care with Jose [name]. GI follow-up as needed

## 2014-02-23 NOTE — Patient Instructions (Signed)
We have sent the following medications to your pharmacy for you to pick up at your convenience:  Anusol [facility] suppositories  Please follow up as needed

## 2014-05-17 ENCOUNTER — Ambulatory Visit: Payer: Medicare Other | Admitting: Podiatry

## 2014-05-17 ENCOUNTER — Ambulatory Visit: Payer: Medicare Other | Admitting: Neurology

## 2014-05-18 ENCOUNTER — Ambulatory Visit: Payer: Medicare Other | Admitting: Podiatry

## 2014-05-18 ENCOUNTER — Telehealth: Payer: Self-pay | Admitting: Neurology

## 2014-05-18 NOTE — Telephone Encounter (Signed)
Called and left Vm message for return call back for rescheduled appointment.

## 2014-05-20 ENCOUNTER — Ambulatory Visit: Payer: Self-pay

## 2014-05-20 ENCOUNTER — Encounter: Payer: Self-pay | Admitting: Podiatrist

## 2014-05-20 ENCOUNTER — Ambulatory Visit (INDEPENDENT_AMBULATORY_CARE_PROVIDER_SITE_OTHER): Payer: Medicare Other | Admitting: Podiatrist

## 2014-05-20 DIAGNOSIS — B351 Tinea unguium: Secondary | ICD-10-CM

## 2014-05-20 DIAGNOSIS — M79676 Pain in unspecified toe(s): Secondary | ICD-10-CM

## 2014-05-20 NOTE — Progress Notes (Signed)
HPI:  Patient presents today for follow up of foot and nail care. Denies any new complaints today.  Objective:  Patients chart is reviewed.  Vascular status reveals pedal pulses noted at 1 out of 4 dp and pt bilateral- slight redness to the feet is noted.  Mild decrease in temperature at the distal tips of toes is noted. Patient denies any symptoms of claudication.  Neurological sensation is intact to [employer] monofilament bilateral.  Patients nails are thickened, discolored, distrophic, friable and brittle with yellow-brown discoloration. Patient subjectively relates they are painful with shoes and with ambulation of bilateral feet.  Assessment:  Symptomatic onychomycosis  Plan:  Discussed treatment options and alternatives.  The symptomatic toenails were debrided through manual an mechanical means without complication.  Return appointment recommended at routine intervals of 3 months

## 2014-05-21 ENCOUNTER — Ambulatory Visit: Payer: Self-pay | Admitting: Neurology

## 2014-06-16 ENCOUNTER — Other Ambulatory Visit: Payer: Self-pay

## 2014-06-24 ENCOUNTER — Emergency Department (HOSPITAL_COMMUNITY)
Admission: EM | Admit: 2014-06-24 | Discharge: 2014-06-24 | Disposition: A | Discharge status: Home / Self Care | Payer: Medicare Other | Attending: Emergency Medicine | Admitting: Emergency Medicine

## 2014-06-24 ENCOUNTER — Encounter (HOSPITAL_COMMUNITY): Payer: Self-pay | Admitting: *Deleted

## 2014-06-24 DIAGNOSIS — Z8719 Personal history of other diseases of the digestive system: Secondary | ICD-10-CM | POA: Insufficient documentation

## 2014-06-24 DIAGNOSIS — F0390 Unspecified dementia without behavioral disturbance: Secondary | ICD-10-CM | POA: Diagnosis not present

## 2014-06-24 DIAGNOSIS — Z8739 Personal history of other diseases of the musculoskeletal system and connective tissue: Secondary | ICD-10-CM | POA: Insufficient documentation

## 2014-06-24 DIAGNOSIS — Z792 Long term (current) use of antibiotics: Secondary | ICD-10-CM | POA: Insufficient documentation

## 2014-06-24 DIAGNOSIS — I951 Orthostatic hypotension: Secondary | ICD-10-CM

## 2014-06-24 DIAGNOSIS — Z7982 Long term (current) use of aspirin: Secondary | ICD-10-CM | POA: Diagnosis not present

## 2014-06-24 DIAGNOSIS — Z8669 Personal history of other diseases of the nervous system and sense organs: Secondary | ICD-10-CM | POA: Diagnosis not present

## 2014-06-24 DIAGNOSIS — Z86010 Personal history of colonic polyps: Secondary | ICD-10-CM | POA: Insufficient documentation

## 2014-06-24 DIAGNOSIS — Z87891 Personal history of nicotine dependence: Secondary | ICD-10-CM | POA: Diagnosis not present

## 2014-06-24 DIAGNOSIS — R55 Syncope and collapse: Secondary | ICD-10-CM | POA: Diagnosis present

## 2014-06-24 DIAGNOSIS — Z79899 Other long term (current) drug therapy: Secondary | ICD-10-CM | POA: Diagnosis not present

## 2014-06-24 LAB — CBC WITH DIFFERENTIAL/PLATELET
Basophils Absolute: 0 10*3/uL (ref 0.0–0.1)
Basophils Relative: 0 % (ref 0–1)
Eosinophils Absolute: 0.1 10*3/uL (ref 0.0–0.7)
Eosinophils Relative: 2 % (ref 0–5)
HCT: 43.1 % (ref 39.0–52.0)
Hemoglobin: 14.5 g/dL (ref 13.0–17.0)
Lymphocytes Relative: 16 % (ref 12–46)
Lymphs Abs: 1 10*3/uL (ref 0.7–4.0)
MCH: 33.3 pg (ref 26.0–34.0)
MCHC: 33.6 g/dL (ref 30.0–36.0)
MCV: 99.1 fL (ref 78.0–100.0)
Monocytes Absolute: 0.5 10*3/uL (ref 0.1–1.0)
Monocytes Relative: 7 % (ref 3–12)
Neutro Abs: 5 10*3/uL (ref 1.7–7.7)
Neutrophils Relative %: 76 % (ref 43–77)
Platelets: 113 10*3/uL — ABNORMAL LOW (ref 150–400)
RBC: 4.35 MIL/uL (ref 4.22–5.81)
RDW: 13.1 % (ref 11.5–15.5)
WBC: 6.6 10*3/uL (ref 4.0–10.5)

## 2014-06-24 LAB — URINALYSIS, ROUTINE W REFLEX MICROSCOPIC
BILIRUBIN URINE: NEGATIVE
Glucose, UA: NEGATIVE mg/dL
HGB URINE DIPSTICK: NEGATIVE
Ketones, ur: NEGATIVE mg/dL
Leukocytes, UA: NEGATIVE
NITRITE: NEGATIVE
Protein, ur: NEGATIVE mg/dL
SPECIFIC GRAVITY, URINE: 1.02 (ref 1.005–1.030)
Urobilinogen, UA: 0.2 mg/dL (ref 0.0–1.0)
pH: 6 (ref 5.0–8.0)

## 2014-06-24 LAB — COMPREHENSIVE METABOLIC PANEL
ALBUMIN: 3.3 g/dL — AB (ref 3.5–5.2)
ALT: 22 U/L (ref 0–53)
AST: 23 U/L (ref 0–37)
Alkaline Phosphatase: 66 U/L (ref 39–117)
Anion gap: 8 (ref 5–15)
BUN: 24 mg/dL — AB (ref 6–23)
CALCIUM: 8.7 mg/dL (ref 8.4–10.5)
CO2: 26 mmol/L (ref 19–32)
Chloride: 104 mmol/L (ref 96–112)
Creatinine, Ser: 1.44 mg/dL — ABNORMAL HIGH (ref 0.50–1.35)
GFR calc Af Amer: 51 mL/min — ABNORMAL LOW (ref >90)
GFR calc non Af Amer: 44 mL/min — ABNORMAL LOW (ref >90)
Glucose, Bld: 145 mg/dL — ABNORMAL HIGH (ref 70–99)
Potassium: 4 mmol/L (ref 3.5–5.1)
Sodium: 138 mmol/L (ref 135–145)
TOTAL PROTEIN: 6 g/dL (ref 6.0–8.3)
Total Bilirubin: 0.8 mg/dL (ref 0.3–1.2)

## 2014-06-24 LAB — TROPONIN I: Troponin I: <0.03 ng/mL (ref <0.031)

## 2014-06-24 LAB — CBG MONITORING, ED: Glucose-Capillary: 127 mg/dL — ABNORMAL HIGH (ref 70–99)

## 2014-06-24 MED ORDER — SODIUM CHLORIDE 0.9 % IV SOLN: INTRAVENOUS | Status: DC

## 2014-06-24 NOTE — ED Notes (Signed)
Pt's wife called EMS for near-syncope.  Pt had just taken shower and became diaphoretic and almost lost consciousness. When EMS arrived pt was laying half-on/half-off bed, feet cyanotic and cool, bp 82/56, hr 69, cbg 97.4.  EMS gave 700 ns and bp increased to 128/82

## 2014-06-24 NOTE — Discharge Instructions (Signed)
Stop taking her blood pressure medication as we discussed. Call Dr. Joylene Draft tomorrow to schedule a follow-up visit  Hypotension As your heart beats, it forces blood through your arteries. This force is your blood pressure. If your blood pressure is too low for you to go about your normal activities or to support the organs of your body, you have hypotension. Hypotension is also referred to as low blood pressure. When your blood pressure becomes too low, you may not get enough blood to your brain. As a result, you may feel weak, feel lightheaded, or develop a rapid heart rate. In a more severe case, you may faint. CAUSES Various conditions can cause hypotension. These include:  Blood loss.  Dehydration.  Heart or endocrine problems.  Pregnancy.  Severe infection.  Not having a well-balanced diet filled with needed nutrients.  Severe allergic reactions (anaphylaxis). Some medicines, such as blood pressure medicine or water pills (diuretics), may lower your blood pressure below normal. Sometimes taking too much medicine or taking medicine not as directed can cause hypotension. TREATMENT  Hospitalization is sometimes required for hypotension if fluid or blood replacement is needed, if time is needed for medicines to wear off, or if further monitoring is needed. Treatment might include changing your diet, changing your medicines (including medicines aimed at raising your blood pressure), and use of support stockings. HOME CARE INSTRUCTIONS   Drink enough fluids to keep your urine clear or pale yellow.  Take your medicines as directed by your health care provider.  Get up slowly from reclining or sitting positions. This gives your blood pressure a chance to adjust.  Wear support stockings as directed by your health care provider.  Maintain a healthy diet by including nutritious food, such as fruits, vegetables, nuts, whole grains, and lean meats. SEEK MEDICAL CARE IF:  You have vomiting  or diarrhea.  You have a fever for more than 2-3 days.  You feel more thirsty than usual.  You feel weak and tired. SEEK IMMEDIATE MEDICAL CARE IF:   You have chest pain or a fast or irregular heartbeat.  You have a loss of feeling in some part of your body, or you lose movement in your arms or legs.  You have trouble speaking.  You become sweaty or feel lightheaded.  You faint. MAKE SURE YOU:   Understand these instructions.  Will watch your condition.  Will get help right away if you are not doing well or get worse. Document Released: 03/19/2005 Document Revised: 01/07/2013 Document Reviewed: 09/19/2012 [facility] Patient Information 2015 [place], [place]. This information is not intended to replace advice given to you by your health care provider. Make sure you discuss any questions you have with your health care provider.

## 2014-06-24 NOTE — ED Provider Notes (Signed)
CSN: [id number]     Arrival date & time 06/24/14  1821 History   First MD Initiated Contact with Patient 06/24/14 1832     Chief Complaint  Patient presents with  . Near Syncope     (Consider location/radiation/quality/duration/timing/severity/associated sxs/prior Treatment) HPI Comments: Patient here after having a syncopal episode just prior to arrival. His wife states that he was sitting on the bed and then leaned back and was difficult to arouse for a couple seconds. No seizure activity noted. Patient denies any chest pain or shortness of breath prior to the event. No recent illnesses. No recent black or bloody stools. Denies any headaches at this time. No recent medication changes. He is back to his baseline at this time. EMS was called and patient's blood pressure was 82/56. CBG was 97. Patient given several 100 mL of saline and became normotensive and transported here.  Patient is a 81 y.o. male presenting with near-syncope. The history is provided by the patient.  Near Syncope    Past Medical History  Diagnosis Date  . Memory loss   . 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 Spinal stenosis, lumbar region, without neurogenic claudication   . Abnormality of gait   . Backache, unspecified   . Unspecified hereditary and idiopathic peripheral neuropathy   . Pain in joint, pelvic region and thigh   . Essential and other specified forms of tremor   . Dementia 11/12/2012  . History of bilateral hip replacements 11/12/2012  . Colon polyps     adenomatous  . Hemorrhoids   . Diverticulosis    Past Surgical History  Procedure Laterality Date  . Total hip arthroplasty Left 2000  . Total hip arthroplasty (aka replacement) Right 2011  . Cataract extraction, bilateral  2012  . Retinal laser procedure  2012    retinal wrinkle   Family History  Problem Relation Age of Onset  . Heart failure Mother    History  Substance Use Topics  . Smoking  status: Former Smoker    Types: Cigarettes  . Smokeless tobacco: Never Used  . Alcohol Use: 8.4 oz/week    14 Glasses of wine per week     Comment: occas, one glass of wine before dinner,sometimes 1/2 glass    Review of Systems  Cardiovascular: Positive for near-syncope.  All other systems reviewed and are negative.     Allergies  Review of patient's allergies indicates no known allergies.  Home Medications   Prior to Admission medications   Medication Sig Start Date End Date Taking? Authorizing Provider  amoxicillin (AMOXIL) 500 MG capsule 4 capsules before going to dentist 04/21/13   Historical Provider, MD  Ascorbic Acid (VITAMIN C) 1000 MG tablet Take 1,000 mg by mouth daily.      Historical Provider, MD  aspirin 81 MG tablet Take 81 mg by mouth daily.      Historical Provider, MD  atenolol-chlorthalidone (TENORETIC) 50-25 MG per tablet Take 0.5 tablets by mouth daily.      Historical Provider, MD  buPROPion (WELLBUTRIN SR) 150 MG 12 hr tablet Take 150 mg by mouth 2 (two) times daily. 01/27/14   Historical Provider, MD  Cholecalciferol (VITAMIN D) 1000 UNITS capsule Take 1,000 Units by mouth daily.      Historical Provider, MD  donepezil (ARICEPT) 23 MG TABS tablet Take 1 tablet (23 mg total) by mouth daily. 11/12/13   Star Age, MD  dorzolamide-timolol (COSOPT) 22.3-6.8 MG/ML ophthalmic solution Place 1  drop into the left eye 2 (two) times daily.  09/20/12   Historical Provider, MD  Flaxseed, Linseed, (FLAXSEED OIL) 1000 MG CAPS Take 1,000 mg by mouth daily.      Historical Provider, MD  folic acid (FOLVITE) 314 MCG tablet Take 400 mcg by mouth every evening.     Historical Provider, MD  gabapentin (NEURONTIN) 100 MG capsule Take 1 capsule (100 mg total) by mouth 2 (two) times daily. 11/12/13   Star Age, MD  hydrocortisone (ANUSOL-HC) 25 MG suppository Place 1 suppository (25 mg total) rectally at bedtime. 02/23/14   Irene Shipper, MD  Memantine HCl ER (NAMENDA XR) 28 MG CP24  Take 28 mg by mouth daily. 11/12/13   Star Age, MD  metFORMIN (GLUCOPHAGE) 500 MG tablet Take 500 mg by mouth daily with breakfast.  10/25/12   Historical Provider, MD  mirabegron ER (MYRBETRIQ) 50 MG TB24 tablet Take 50 mg by mouth daily.    Historical Provider, MD  Multiple Vitamin (MULTIVITAMIN) tablet Take 1 tablet by mouth daily.      Historical Provider, MD  potassium chloride (KLOR-CON) 10 MEQ CR tablet Take 10 mEq by mouth 2 (two) times a week.      Historical Provider, MD  simvastatin (ZOCOR) 40 MG tablet Take 40 mg by mouth every evening.     Historical Provider, MD   BP 113/63 mmHg  Pulse 70  Temp(Src) 97.4 F (36.3 C) (Oral)  Resp 14  SpO2 98% Physical Exam  Constitutional: He is oriented to person, place, and time. He appears well-developed and well-nourished.  Non-toxic appearance. No distress.  HENT:  Head: Normocephalic and atraumatic.  Eyes: Conjunctivae, EOM and lids are normal. Pupils are equal, round, and reactive to light.  Neck: Normal range of motion. Neck supple. No tracheal deviation present. No thyroid mass present.  Cardiovascular: Normal rate, regular rhythm and normal heart sounds.  Exam reveals no gallop.   No murmur heard. Pulmonary/Chest: Effort normal and breath sounds normal. No stridor. No respiratory distress. He has no decreased breath sounds. He has no wheezes. He has no rhonchi. He has no rales.  Abdominal: Soft. Normal appearance and bowel sounds are normal. He exhibits no distension. There is no tenderness. There is no rebound and no CVA tenderness.  Musculoskeletal: Normal range of motion. He exhibits no edema or tenderness.  Neurological: He is alert and oriented to person, place, and time. He has normal strength. No cranial nerve deficit or sensory deficit. GCS eye subscore is 4. GCS verbal subscore is 5. GCS motor subscore is 6.  Skin: Skin is warm and dry. No abrasion and no rash noted.  Psychiatric: He has a normal mood and affect. His speech  is normal and behavior is normal.  Nursing note and vitals reviewed.   ED Course  Procedures (including critical care time) Labs Review Labs Reviewed  TROPONIN I  CBC WITH DIFFERENTIAL/PLATELET  COMPREHENSIVE METABOLIC PANEL  URINALYSIS, ROUTINE W REFLEX MICROSCOPIC    Imaging Review No results found.   EKG Interpretation   Date/Time:  Thursday June 24 2014 18:37:58 EDT Ventricular Rate:  69 PR Interval:    QRS Duration: 106 QT Interval:  434 QTC Calculation: 465 R Axis:   -15 Text Interpretation:  sinus rythm Borderline left axis deviation Low  voltage, precordial leads Borderline repolarization abnormality artifact  Confirmed by Zenia Resides  MD, Raney Koeppen ([id number]) on 06/24/2014 10:01:38 PM      MDM   Final diagnoses:  None  Pt given iv fluids and feels better. Elevated creat. Noted. Orthostatics neg.--pt will follow-up with his Dr. next week and return precautions given. Will have patient hold his blood pressure medication and follow-up with his doctor.    Lacretia Leigh, MD 06/24/14 2211

## 2014-06-29 ENCOUNTER — Ambulatory Visit: Payer: Medicare Other | Admitting: Neurology

## 2014-07-16 ENCOUNTER — Encounter: Payer: Self-pay | Admitting: Neurology

## 2014-07-16 ENCOUNTER — Ambulatory Visit (INDEPENDENT_AMBULATORY_CARE_PROVIDER_SITE_OTHER): Payer: Medicare Other | Admitting: Neurology

## 2014-07-16 VITALS — BP 145/87 | HR 72 | Resp 16 | Ht 68.0 in | Wt 180.0 lb

## 2014-07-16 DIAGNOSIS — M545 Low back pain, unspecified: Secondary | ICD-10-CM

## 2014-07-16 DIAGNOSIS — Z96643 Presence of artificial hip joint, bilateral: Secondary | ICD-10-CM

## 2014-07-16 DIAGNOSIS — R55 Syncope and collapse: Secondary | ICD-10-CM

## 2014-07-16 DIAGNOSIS — F0390 Unspecified dementia without behavioral disturbance: Secondary | ICD-10-CM | POA: Diagnosis not present

## 2014-07-16 MED ORDER — DONEPEZIL HCL 23 MG PO TABS: 23.0000 mg | ORAL_TABLET | Freq: Every day | ORAL | Status: DC

## 2014-07-16 MED ORDER — MEMANTINE HCL ER 28 MG PO CP24: 28.0000 mg | ORAL_CAPSULE | Freq: Every day | ORAL | Status: DC

## 2014-07-16 NOTE — Progress Notes (Signed)
Subjective:    Patient ID: Jose [name] is a 81 y.o. male.  HPI     Interim history:   Jose [name] is a very friendly 81 year old right-handed gentleman with an underlying medical history of left eye retinal disease, arthritis and status post L THR in 2000, R THR in 2011, ET with a positive FHx, PN, HLP, HTN, LBP, depression, reflux disease, and memory loss, who presents for followup consultation of his dementia without behavioral disturbance. He is accompanied by his wife again today. I last saw him on 11/12/13, at which time his wife reported that he was sleeping more and more and not motivated to do things. He fell once as he held onto the towel rack in the bathroom and tore the rack off the wall and bumped his head. No laceration, no HA, no LOC, no injuries. His memory scores were stable and I kept him on the same medications. In the interim, on 02/21/2014 he presented to the emergency room with painless rectal bleeding. I reviewed the emergency room records. He was hemodynamically stable. He was set up with GI follow-up.  In the interim, he presented to the emergency room again on 06/24/2014 with near syncope versus actual syncopal event at home. I reviewed the emergency room records. His wife called EMS and the blood pressure was reportedly 82/56 and blood sugar level was 97. His creatinine was 1.44 and this was up from his baseline. He was given IV fluids and improved. His blood pressure improved.   Today, 07/16/2014: His wife provides most of the history today. She reports, that the day of the syncope he had just gotten out of the shower. He was dressing himself and was sitting on a bench which he uses for changing. She had left the room and was looking for his glasses and when she came back he was not responding and fell backwards onto the bed. He was not responsive and she called 911. Of note, she also adds that he has a tendency to take very hot showers. He has not been drinking water very well  and this has been an ongoing issue. He has been off of his blood pressure medication. His primary care physician agreed with that. They had talked about using compression stockings in the past but the problem was that they cannot get those on. Memory wise he has remained stable. They have noticed that his leg swelling has become worse since he was taken off blood pressure medication which contained hydrochlorothiazide.  Previously: I saw him on 05/14/2013, at which time I did not make any medication changes. He was doing well per wife. He continued to be active. I did encourage him to drink more water. I kept him on long-acting Namenda 28 mg and Aricept 23 mg daily.  I first met him on 11/12/2012, which time I felt that his MMSE was a little worse and his category fluency was also less than before. His MMSE score was 23/30, CDT was 4/4, AFT (Animal Fluency Test) score was 15. He has been on maximum doses of Aricept and Namenda and he did not qualify for the clinical trial for dementia. I was reluctant to switch him to another anti-cholinergic medication for fear of side effects.   He previously followed with Dr. Morene Antu and was last seen by him on 05/14/2012, which time Dr. Erling Cruz felt that he was a potential candidate for a clinical trial. The patient's MMSE score was 26, clock drawing was 4 out of  4, and his falls assessment tool score was 12.   He started having progressive memory loss in April 2000. This was after his hip surgery. His second hip surgery was done under spinal anesthesia with good results. EMG and nerve conduction studies as well as lumbar spine MRI in November 2012 showed multilevel spondylosis, as moderately severe canal stenosis and broad-based disc bulge at L1-2. B12 level, RPR, TSH were normal. MRI brain with and without contrast on February 2004 showed atrophy and small vessel disease. He had a repeat brain MRI in October 2012. He has been on Aricept since 2004 and [place] since  March 2005. He also has peripheral neuropathy for which he had evaluation in January 2009 with SPEP, ACE level, vitamin B12, hemoglobin A1c, PSA, TSH, lipids, CBC, CMP, urinalysis, all normal in February 2010. In December 2012 his MMSE was 35, and in April 2013 his MMSE was 22, clock drawing was 4, animal fluency was 18.   He was started on low dose Lexapro by Dr. Joylene Draft. The patient has had a tendency to pick at his skin lesions on the scalp. His son and daughter are local and involved. 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 He no longer drives. He goes for aqua exercises 3 times/week. He has had no VH/AH. Eating too much and forgetting that he ate has been an issue per wife. He had a sleep study in the past in 2013, which was negative for OSA per wife.    His Past Medical History Is Significant For: Past Medical History  Diagnosis Date  . Memory loss   . 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 Spinal stenosis, lumbar region, without neurogenic claudication   . Abnormality of gait   . Backache, unspecified   . Unspecified hereditary and idiopathic peripheral neuropathy   . Pain in joint, pelvic region and thigh   . Essential and other specified forms of tremor   . Dementia 11/12/2012  . History of bilateral hip replacements 11/12/2012  . Colon polyps     adenomatous  . Hemorrhoids   . Diverticulosis   . Skin cancer of scalp     His Past Surgical History Is Significant For: Past Surgical History  Procedure Laterality Date  . Total hip arthroplasty Left 2000  . Total hip arthroplasty (aka replacement) Right 2011  . Cataract extraction, bilateral  2012  . Retinal laser procedure  2012    retinal wrinkle  . Skin cancer excision      His Family History Is Significant For: Family History  Problem Relation Age of Onset  . Heart failure Mother     His Social History Is Significant For: History   Social History  . Marital Status: Married     Spouse Name: [name]  . Number of Children: 2  . Years of Education: lawyer   Occupational History  . Retired     retired   Social History Main Topics  . Smoking status: Former Smoker    Types: Cigarettes  . Smokeless tobacco: Never Used  . Alcohol Use: 8.4 oz/week    14 Glasses of wine per week     Comment: occas, one glass of wine before dinner,sometimes 1/2 glass  . Drug Use: No  . Sexual Activity: Not on file   Other Topics Concern  . None   Social History Narrative   Patient is right handed and resides in home with wife    His Allergies Are:  No Known Allergies:   His Current Medications Are:  Outpatient Encounter Prescriptions as of 07/16/2014  Medication Sig  . Ascorbic Acid (VITAMIN C) 1000 MG tablet Take 1,000 mg by mouth daily.    Marland Kitchen buPROPion (WELLBUTRIN SR) 150 MG 12 hr tablet Take 150 mg by mouth daily.   . Cholecalciferol (VITAMIN D) 1000 UNITS capsule Take 1,000 Units by mouth daily.    Marland Kitchen donepezil (ARICEPT) 23 MG TABS tablet Take 1 tablet (23 mg total) by mouth daily.  . dorzolamide-timolol (COSOPT) 22.3-6.8 MG/ML ophthalmic solution Place 1 drop into the left eye 2 (two) times daily.   . Flaxseed, Linseed, (FLAXSEED OIL) 1000 MG CAPS Take 1,000 mg by mouth daily.    . folic acid (FOLVITE) 096 MCG tablet Take 800 mcg by mouth every evening.   . gabapentin (NEURONTIN) 100 MG capsule Take 1 capsule (100 mg total) by mouth 2 (two) times daily.  . hydrocortisone (ANUSOL-HC) 25 MG suppository Place 1 suppository (25 mg total) rectally at bedtime.  . Memantine HCl ER (NAMENDA XR) 28 MG CP24 Take 28 mg by mouth daily.  . metFORMIN (GLUCOPHAGE) 500 MG tablet Take 500 mg by mouth daily with breakfast.   . mirabegron ER (MYRBETRIQ) 50 MG TB24 tablet Take 50 mg by mouth daily.  . Misc Natural Products (TURMERIC CURCUMIN) CAPS Take 1 capsule by mouth daily.  . Multiple Vitamin (MULTIVITAMIN) tablet Take 1 tablet by mouth daily.    . potassium chloride (KLOR-CON) 10 MEQ CR  tablet Take 10 mEq by mouth 2 (two) times a week.    . Probiotic Product (ALIGN) 4 MG CAPS Take 4 mg by mouth daily.  . simvastatin (ZOCOR) 40 MG tablet Take 40 mg by mouth every evening.   . [DISCONTINUED] amoxicillin (AMOXIL) 500 MG capsule 4 capsules before going to dentist  :  Review of Systems:  Out of a complete 14 point review of systems, all are reviewed and negative with the exception of these symptoms as listed below:   Review of Systems  Cardiovascular: Positive for leg swelling.  Neurological: Positive for syncope.       Jose [name] had a syncopal episode on Easter, patient taken off of blood pressure medication and ASA.     Objective:  Neurologic Exam  Physical Exam Physical Examination:   Filed Vitals:   07/16/14 1224  BP: 145/87  Pulse: 72  Resp: 16   General Examination: The patient is a very pleasant 81 y.o. male in no acute distress. He is calm and cooperative with the exam. He denies Auditory Hallucinations and Visual Hallucinations. He is in good spirits today, but less verbal.  HEENT: Normocephalic, atraumatic, pupils are equal, round and reactive to light and accommodation. Funduscopic exam is normal with sharp disc margins noted. Extraocular tracking shows mild saccadic breakdown without nystagmus noted. Hearing is intact. Tympanic membranes are clear bilaterally. Face is symmetric with no facial masking and normal facial sensation. There is no lip, neck or jaw tremor. Neck is not rigid with intact passive ROM. There are no carotid bruits on auscultation. Oropharynx exam reveals mild to moderate mouth dryness. No significant airway crowding is noted. Mallampati is class I. Tongue protrudes centrally and palate elevates symmetrically.    Chest: is clear to auscultation without wheezing, rhonchi or crackles noted.  Heart: sounds are regular and normal without murmurs, rubs or gallops noted.   Abdomen: is soft, non-tender and non-distended with normal bowel sounds  appreciated on auscultation.  Extremities: There is 1+  pitting edema in the distal lower extremities bilaterally, especially around his ankles, left worse than right. Pedal pulses are intact.  Skin: is warm and dry with no trophic changes noted. He has multiple scars on his scalp. He has precancerous lesions which are usually taken off by his dermatologist.  Musculoskeletal: exam reveals no obvious joint deformities, tenderness or joint swelling or erythema.  Neurologically:  Mental status: The patient is awake and alert, paying fair attention. He is able to partially provide the history. His wife provides most of his Hx. He is oriented to: person, place and day of week. His memory, attention, language and knowledge are impaired. There is no aphasia, agnosia, apraxia or anomia. There is a mild degree of bradyphrenia. Speech is mildly hypophonic with no dysarthria noted. Mood is congruent and affect is normal.   On 11/12/12: His MMSE score was 23/30. CDT was 4/4. AFT (Animal Fluency Test) score was 15.  On 05/14/13: MMSE was 19/30, CDT 4/4, AFT 15. On 11/12/2013: MMSE is 20/30, CDT 4/4, AFT 16.  On 07/16/2014: MMSE: 20/30, CDT: 4/4, AFT: 16/min.  Cranial nerves are as described above under HEENT exam. In addition, shoulder shrug is normal with equal shoulder height noted.  Motor exam: Normal bulk, and strength for age is noted. Tone is not rigid with absence of cogwheeling in the extremities. There is overall no significant bradykinesia. There is no drift or rebound. There is a mild postural and action tremor in both upper extremities. There is no significant resting tremor. Romberg is negative. Reflexes are 1+ in the upper extremities and 1+ in the lower extremities. Fine motor skills are mildly impaired bilaterally.   There is no truncal or gait ataxia.   Sensory exam is intact to light touch, but decreased mildly to vibration, PP and temperature sense in both feet.   Gait, station and balance:  He stands up from the seated position with mild difficulty and posture is mildly stooped. Stance is narrow-based. He turns in 3 steps. Balance is fairly well preserved. He uses his cane barely.  Assessment and Plan:   In summary, Jose [name] is a very pleasant 81 year old male with an underlying medical history of chronic back pain, arthritis, depression, reflux disease, and hypertension who has a long-standing history of dementia, of approximately 16 years duration. His memory numbers are actually stable from 8 months ago and I think he has done fairly well on a combination of long-acting Namenda 28 mg and Aricept 23 mg daily, tolerating medications well. He is on the maximum doses of Aricept and Namenda and I suggested we continue with the current medications. He is advised to increase his water intake. He had a recent syncopal spell and was taken to the emergency room. He received IV hydration and he has since then been taken off of his blood pressure medication. He is encouraged to continue to be active physically and mentally. he is encouraged to drink more water particularly in light of his syncope. We have to monitor for this issue. Sometimes Aricept can cause issues with syncope, bradycardia etc. He has never had a similar issue before and I think this was truly in the context of vasodilation and poor oral hydration. His blood pressure is stable. He did not have any lightheadedness upon standing today. His swelling is a little bit worse since he was taken off of hydrochlorothiazide. They are advised to look into over-the-counter compression stockings up to the knee that are easier to pull on.  He has not yet resumed his exercise routine since the syncope. He is encouraged to exercise regularly. 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 His son usually takes  him to the gym and also works out at the same time so he can also keep an eye on him. Thankfully he has a very caring and involved family and a good support system overall and he has been able to tolerate both medications at the current doses. I renewed his prescriptions for Aricept and Namenda XR.  I would like to see him back in 6 months, sooner if the need arises. I encouraged him to call with any interim questions, concerns, problems, updates, or refill requests.  I spent 25 minutes in total face-to-face time with the patient, more than 50% of which was spent in counseling and coordination of care, reviewing test results, reviewing medication and discussing or reviewing the diagnosis of dementia, syncope, the prognosis and treatment options.

## 2014-07-16 NOTE — Patient Instructions (Signed)
Please use mild over the counter compression stockings for your ankle swelling.  Drink more water! We will monitor your memory and numbers are stable from 8 months ago.  We will continue with your medications.  You should exercise in moderation, no more than 30 min and hydrate.

## 2014-08-19 ENCOUNTER — Ambulatory Visit: Payer: Medicare Other | Admitting: Podiatrist

## 2014-08-26 ENCOUNTER — Ambulatory Visit (INDEPENDENT_AMBULATORY_CARE_PROVIDER_SITE_OTHER): Payer: Medicare Other

## 2014-08-26 DIAGNOSIS — M79673 Pain in unspecified foot: Secondary | ICD-10-CM

## 2014-08-26 DIAGNOSIS — I739 Peripheral vascular disease, unspecified: Secondary | ICD-10-CM

## 2014-08-26 DIAGNOSIS — B351 Tinea unguium: Secondary | ICD-10-CM | POA: Diagnosis not present

## 2014-08-26 NOTE — Progress Notes (Signed)
Patient ID: Jose [name], male   DOB: 14-Jan-1931, 81 y.o.   MRN: [id number] Complaint:  Visit Type: Patient returns to my office for continued preventative foot care services. Complaint: Patient states" my nails have grown long and thick and become painful to walk and wear shoes" . He presents for preventative foot care services. No changes to ROS  Podiatric Exam: Vascular: dorsalis pedis and posterior tibial pulses are not  palpable bilateral. Capillary return is immediate. Temperature gradient is diminished. Skin turgor  diminished  Sensorium: Normal Semmes Weinstein monofilament test. Normal tactile sensation bilaterally. Nail Exam: Pt has thick disfigured discolored nails with subungual debris noted bilateral entire nail hallux through fifth toenails Ulcer Exam: There is no evidence of ulcer or pre-ulcerative changes or infection. Orthopedic Exam: Muscle tone and strength are WNL. No limitations in general ROM. No crepitus or effusions noted. Foot type and digits show no abnormalities. Bony prominences are unremarkable. Skin: No Porokeratosis. No infection or ulcers  Diagnosis:  Tinea unguium, Pain in right toe, pain in left toes, PVD  Treatment & Plan Procedures and Treatment: Consent by patient was obtained for treatment procedures. The patient understood the discussion of treatment and procedures well. All questions were answered thoroughly reviewed. Debridement of mycotic and hypertrophic toenails, 1 through 5 bilateral and clearing of subungual debris. No ulceration, no infection noted.  Return Visit-Office Procedure: Patient instructed to return to the office for a follow up visit 3 months for continued evaluation and treatment.

## 2014-12-02 ENCOUNTER — Ambulatory Visit: Payer: Medicare Other | Admitting: Podiatry

## 2014-12-10 ENCOUNTER — Ambulatory Visit (INDEPENDENT_AMBULATORY_CARE_PROVIDER_SITE_OTHER): Payer: Medicare Other | Admitting: Podiatry

## 2014-12-10 ENCOUNTER — Encounter: Payer: Self-pay | Admitting: Podiatry

## 2014-12-10 DIAGNOSIS — B351 Tinea unguium: Secondary | ICD-10-CM

## 2014-12-10 DIAGNOSIS — M79676 Pain in unspecified toe(s): Secondary | ICD-10-CM | POA: Diagnosis not present

## 2014-12-10 NOTE — Progress Notes (Signed)
Patient ID: Jose [name], male   DOB: 11/25/1930, 81 y.o.   MRN: [id number] Complaint:  Visit Type: Patient returns to my office for continued preventative foot care services. Complaint: Patient states" my nails have grown long and thick and become painful to walk and wear shoes" . He presents for preventative foot care services. No changes to ROS  Podiatric Exam: Vascular: dorsalis pedis and posterior tibial pulses are not  palpable bilateral. Capillary return is immediate. Temperature gradient is diminished. Skin turgor  diminished  Sensorium: Normal Semmes Weinstein monofilament test. Normal tactile sensation bilaterally. Nail Exam: Pt has thick disfigured discolored nails with subungual debris noted bilateral entire nail hallux through fifth toenails Ulcer Exam: There is no evidence of ulcer or pre-ulcerative changes or infection. Orthopedic Exam: Muscle tone and strength are WNL. No limitations in general ROM. No crepitus or effusions noted. Foot type and digits show no abnormalities. Bony prominences are unremarkable. Skin: No Porokeratosis. No infection or ulcers  Diagnosis:  Tinea unguium, Pain in right toe, pain in left toes, PVD  Treatment & Plan Procedures and Treatment: Consent by patient was obtained for treatment procedures. The patient understood the discussion of treatment and procedures well. All questions were answered thoroughly reviewed. Debridement of mycotic and hypertrophic toenails, 1 through 5 bilateral and clearing of subungual debris. No ulceration, no infection noted.  Return Visit-Office Procedure: Patient instructed to return to the office for a follow up visit 3 months for continued evaluation and treatment. 

## 2014-12-21 ENCOUNTER — Other Ambulatory Visit: Payer: Self-pay

## 2014-12-21 DIAGNOSIS — M545 Low back pain, unspecified: Secondary | ICD-10-CM

## 2014-12-21 MED ORDER — GABAPENTIN 100 MG PO CAPS: 100.0000 mg | ORAL_CAPSULE | Freq: Two times a day (BID) | ORAL | Status: DC

## 2015-01-20 ENCOUNTER — Encounter: Payer: Self-pay | Admitting: Neurology

## 2015-01-20 ENCOUNTER — Ambulatory Visit (INDEPENDENT_AMBULATORY_CARE_PROVIDER_SITE_OTHER): Payer: Medicare Other | Admitting: Neurology

## 2015-01-20 VITALS — BP 130/72 | HR 78 | Resp 20 | Ht 69.0 in | Wt 182.0 lb

## 2015-01-20 DIAGNOSIS — F0390 Unspecified dementia without behavioral disturbance: Secondary | ICD-10-CM | POA: Diagnosis not present

## 2015-01-20 NOTE — Progress Notes (Signed)
Subjective:    Patient ID: Jose [name] is a 81 y.o. male.  HPI     Interim history:   Jose [name] is a very friendly 81 year old right-handed gentleman with an underlying medical history of left eye retinal disease, arthritis and status post L THR in 2000, R THR in 2011, ET with a positive FHx, PN, HLP, HTN, LBP, depression, reflux disease, and memory loss, who presents for followup consultation of his dementia without behavioral disturbance. He is accompanied by his wife again today. I last saw him on 07/16/2014, at which time his MMSE was 20, clock drawing was 4, animal fluency was 16. He reported a recent syncopal spell. His wife reported that he had just gotten out of the shower was in the process of dressing himself, sitting on a bench in his room. When she left the room he slumped back onto his bed and was unresponsive. She called 911. She did report that he had a tendency to take very hot showers. He does not always drink enough water. He had been off of his blood pressure medication. They had talked about using compression stockings to his lower extremities but his problem was that they were difficult to get on. Memory was stable. His leg swelling had become worse because he was no longer on a diuretic. I suggested no medication changes. I asked him to continue his long-acting Namenda and Aricept at 23 mg daily. I advised him to drink more water and consider compression stockings to the lower extremities.  Today, 01/20/2015: He reports doing okay. His wife provides most of the history. 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 Lower extremity swelling is still the same.   Previously:  I saw him on 11/12/13, at which time his wife reported that he was sleeping more and more and not motivated to do things. He fell once as he held onto the towel rack in the bathroom and tore the rack off the  wall and bumped his head. No laceration, no HA, no LOC, no injuries. His memory scores were stable and I kept him on the same medications. In the interim, on 02/21/2014 he presented to the emergency room with painless rectal bleeding. I reviewed the emergency room records. He was hemodynamically stable. He was set up with GI follow-up.  In the interim, he presented to the emergency room again on 06/24/2014 with near syncope versus actual syncopal event at home. I reviewed the emergency room records. His wife called EMS and the blood pressure was reportedly 82/56 and blood sugar level was 97. His creatinine was 1.44 and this was up from his baseline. He was given IV fluids and improved. His blood pressure improved.   I saw him on 05/14/2013, at which time I did not make any medication changes. He was doing well per wife. He continued to be active. I did encourage him to drink more water. I kept him on long-acting Namenda 28 mg and Aricept 23 mg daily.  I first met him on 11/12/2012, which time I felt that his MMSE was a little worse and his category fluency was also less than before. His MMSE score was 23/30, CDT was 4/4, AFT (Animal Fluency Test) score was 15. He has been on maximum doses of Aricept and Namenda and he did not qualify for the clinical trial for dementia. I was reluctant to switch him to another  anti-cholinergic medication for fear of side effects.   He previously followed with Dr. Morene Antu and was last seen by him on 05/14/2012, which time Dr. Erling Cruz felt that he was a potential candidate for a clinical trial. The patient's MMSE score was 26, clock drawing was 4 out of 4, and his falls assessment tool score was 12.   He started having progressive memory loss in April 2000. This was after his hip surgery. His second hip surgery was done under spinal anesthesia with good results. EMG and nerve conduction studies as well as lumbar spine MRI in November 2012 showed multilevel spondylosis, as  moderately severe canal stenosis and broad-based disc bulge at L1-2. B12 level, RPR, TSH were normal. MRI brain with and without contrast on February 2004 showed atrophy and small vessel disease. He had a repeat brain MRI in October 2012. He has been on Aricept since 2004 and [place] since March 2005. He also has peripheral neuropathy for which he had evaluation in January 2009 with SPEP, ACE level, vitamin B12, hemoglobin A1c, PSA, TSH, lipids, CBC, CMP, urinalysis, all normal in February 2010. In December 2012 his MMSE was 63, and in April 2013 his MMSE was 22, clock drawing was 4, animal fluency was 18.   He was started on low dose Lexapro by Dr. Joylene Draft. The patient has had a tendency to pick at his skin lesions on the scalp. His son and daughter are local and involved. 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 He no longer drives. He goes for aqua exercises 3 times/week. He has had no VH/AH. Eating too much and forgetting that he ate has been an issue per wife. He had a sleep study in the past in 2013, which was negative for OSA per wife.    His Past Medical History Is Significant For: Past Medical History  Diagnosis Date  . Memory loss   . 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 Spinal stenosis, lumbar region, without neurogenic claudication   . Abnormality of gait   . Backache, unspecified   . Unspecified hereditary and idiopathic peripheral neuropathy   . Pain in joint, pelvic region and thigh   . Essential and other specified forms of tremor   . Dementia 11/12/2012  . History of bilateral hip replacements 11/12/2012  . Colon polyps     adenomatous  . Hemorrhoids   . Diverticulosis   . Skin cancer of scalp     His Past Surgical History Is Significant For: Past Surgical History  Procedure Laterality Date  . Total hip arthroplasty Left 2000  . Total hip arthroplasty (aka replacement) Right 2011  . Cataract extraction, bilateral  2012  . Retinal  laser procedure  2012    retinal wrinkle  . Skin cancer excision      His Family History Is Significant For: Family History  Problem Relation Age of Onset  . Heart failure Mother     His Social History Is Significant For: Social History   Social History  . Marital Status: Married    Spouse Name: [name]  . Number of Children: 2  . Years of Education: lawyer   Occupational History  . Retired     retired   Social History Main Topics  . Smoking status: Former Smoker    Types: Cigarettes  . Smokeless tobacco: Never Used  . Alcohol Use: 8.4 oz/week    14 Glasses of wine per week     Comment:  occas, one glass of wine before dinner,sometimes 1/2 glass  . Drug Use: No  . Sexual Activity: Not Asked   Other Topics Concern  . None   Social History Narrative   Patient is right handed and resides in home with wife    His Allergies Are:  No Known Allergies:   His Current Medications Are:  Outpatient Encounter Prescriptions as of 01/20/2015  Medication Sig  . Ascorbic Acid (VITAMIN C) 1000 MG tablet Take 1,000 mg by mouth daily.    Marland Kitchen buPROPion (WELLBUTRIN SR) 150 MG 12 hr tablet Take 150 mg by mouth daily.   . Cholecalciferol (VITAMIN D) 1000 UNITS capsule Take 1,000 Units by mouth daily.    Marland Kitchen donepezil (ARICEPT) 23 MG TABS tablet Take 1 tablet (23 mg total) by mouth daily.  . dorzolamide-timolol (COSOPT) 22.3-6.8 MG/ML ophthalmic solution Place 1 drop into the left eye 2 (two) times daily.   . Flaxseed, Linseed, (FLAXSEED OIL) 1000 MG CAPS Take 1,000 mg by mouth daily.    . folic acid (FOLVITE) 818 MCG tablet Take 800 mcg by mouth every evening.   . gabapentin (NEURONTIN) 100 MG capsule Take 1 capsule (100 mg total) by mouth 2 (two) times daily.  . hydrocortisone (ANUSOL-HC) 25 MG suppository Place 1 suppository (25 mg total) rectally at bedtime.  . memantine (NAMENDA XR) 28 MG CP24 24 hr capsule Take 1 capsule (28 mg total) by mouth daily.  . metFORMIN (GLUCOPHAGE) 500 MG  tablet Take 500 mg by mouth daily with breakfast.   . mirabegron ER (MYRBETRIQ) 50 MG TB24 tablet Take 50 mg by mouth daily.  . Misc Natural Products (TURMERIC CURCUMIN) CAPS Take 1 capsule by mouth daily.  . Multiple Vitamin (MULTIVITAMIN) tablet Take 1 tablet by mouth daily.    . potassium chloride (KLOR-CON) 10 MEQ CR tablet Take 10 mEq by mouth 2 (two) times a week.    . Probiotic Product (ALIGN) 4 MG CAPS Take 4 mg by mouth daily.  . simvastatin (ZOCOR) 40 MG tablet Take 40 mg by mouth every evening.    No facility-administered encounter medications on file as of 01/20/2015.  :  Review of Systems:  Out of a complete 14 point review of systems, all are reviewed and negative with the exception of these symptoms as listed below:   Review of Systems  Neurological:       Wife reports that patient seems to be weaker and have less energy. Shaden goes to the gym 3x a week and works with a Clinical research associate. The trainer would like to know if there is anything more he needs to do for patient.     Objective:  Neurologic Exam  Physical Exam Physical Examination:   Filed Vitals:   01/20/15 1249  BP: 130/72  Pulse: 78  Resp: 20   General Examination: The patient is a very pleasant 81 y.o. male in no acute distress. He is calm and cooperative with the exam. He denies Auditory Hallucinations and Visual Hallucinations. He is in good spirits today, but less verbal.  HEENT: Normocephalic, atraumatic, pupils are equal, round and reactive to light and accommodation. Funduscopic exam is normal with sharp disc margins noted. Extraocular tracking shows mild saccadic breakdown without nystagmus noted. Hearing is intact. Tympanic membranes are clear bilaterally. Face is symmetric with no facial masking and normal facial sensation. There is no lip, neck or jaw tremor. Neck is not rigid with intact passive ROM. There are no carotid bruits on auscultation. Oropharynx exam reveals  mild to moderate mouth dryness. No  significant airway crowding is noted. Mallampati is class I. Tongue protrudes centrally and palate elevates symmetrically.    Chest: is clear to auscultation without wheezing, rhonchi or crackles noted.  Heart: sounds are regular and normal without murmurs, rubs or gallops noted.   Abdomen: is soft, non-tender and non-distended with normal bowel sounds appreciated on auscultation.  Extremities: There is 1 to 2 + pitting edema in the distal lower extremities bilaterally, especially around his ankles, but now extending  left worse than right. Pedal pulses are intact.  Skin: is warm and dry with no trophic changes noted. He has multiple scars on his scalp. He has precancerous lesions which are usually taken off by his dermatologist.  Musculoskeletal: exam reveals no obvious joint deformities, tenderness or joint swelling or erythema.  Neurologically:  Mental status: The patient is awake and alert, paying fair attention. He is able to partially provide the history. His wife provides most of his Hx. He is oriented to: person, place and day of week. His memory, attention, language and knowledge are impaired. There is no aphasia, agnosia, apraxia or anomia. There is a mild degree of bradyphrenia. Speech is mildly hypophonic with no dysarthria noted. Mood is congruent and affect is normal.   On 11/12/12: His MMSE score was 23/30. CDT was 4/4. AFT (Animal Fluency Test) score was 15.  On 05/14/13: MMSE was 19/30, CDT 4/4, AFT 15. On 11/12/2013: MMSE is 20/30, CDT 4/4, AFT 16.  On 07/16/2014: MMSE: 20/30, CDT: 4/4, AFT: 16/min. On 01/20/2015: MMSE: 20/30, CDT: 3/4, AFT: 12/min.  Cranial nerves are as described above under HEENT exam. In addition, shoulder shrug is normal with equal shoulder height noted.  Motor exam: Normal bulk, and strength for age is noted. Tone is not rigid with absence of cogwheeling in the extremities. There is overall no significant bradykinesia. There is no drift or rebound. There  is a mild postural and action tremor in both upper extremities. There is no significant resting tremor. Romberg is negative. Reflexes are 1+ in the upper extremities and 1+ in the lower extremities. Fine motor skills are mildly impaired bilaterally.   There is no truncal or gait ataxia.   Sensory exam is intact to light touch, but decreased mildly to vibration, PP and temperature sense in both feet.   Gait, station and balance: He stands up from the seated position with mild difficulty and posture is mildly stooped. Stance is narrow-based. He turns in 3 steps. Balance is fairly well preserved. He uses his cane barely.  Assessment and Plan:   In summary, Jose [name] is a very pleasant 81 year old male with an underlying medical history of chronic back pain, arthritis, depression, reflux disease, and hypertension who has a long-standing history of dementia, of approximately 16 to 17 years duration. His memory numbers have been fairly stable and generally speaking, he has done fairly well on a combination of long-acting Namenda 28 mg and Aricept 23 mg daily, tolerating medications well. He is on the maximum doses of Aricept and Namenda and I suggested we continue with the current medications. He is advised to increase his water intake and use his compression stockings as his swelling in the legs is actually worse. He had a syncopal spell this year and was taken to the emergency room. He received IV hydration and he has taken off of his blood pressure medication. He is encouraged to continue to be active physically and mentally. Sometimes Aricept can  cause issues with syncope, bradycardia etc. He has never had a similar issue before and I think this was truly in the context of vasodilation and poor oral hydration. His blood pressure is stable and good today. He did not have any lightheadedness upon standing today. His swelling is a little bit worse since he was taken off of hydrochlorothiazide. 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 His son usually takes him to the gym and also works out at the same time so he can also keep an eye on him. Thankfully he has a very caring and involved family and a good support system overall and he has been able to tolerate both medications at the current doses. He did not need any prescriptions for Aricept and Namenda XR today.  I would like to see him back in 6 months, sooner if the need arises. I encouraged him to call with any interim questions, concerns, problems, updates, or refill requests.  I spent 25 minutes in total face-to-face time with the patient, more than 50% of which was spent in counseling and coordination of care, reviewing test results, reviewing medication and discussing or reviewing the diagnosis of dementia, the prognosis and treatment options.

## 2015-01-20 NOTE — Patient Instructions (Signed)
We will continue with your 2 memory medications.  Consider using a walker, the rolling walker should be fine. Drink plenty of water and change positions slowly.  Follow up in 6 months.

## 2015-03-10 ENCOUNTER — Encounter: Payer: Self-pay | Admitting: Podiatry

## 2015-03-10 ENCOUNTER — Ambulatory Visit (INDEPENDENT_AMBULATORY_CARE_PROVIDER_SITE_OTHER): Payer: Medicare Other | Admitting: Podiatry

## 2015-03-10 DIAGNOSIS — I739 Peripheral vascular disease, unspecified: Secondary | ICD-10-CM

## 2015-03-10 DIAGNOSIS — B351 Tinea unguium: Secondary | ICD-10-CM | POA: Diagnosis not present

## 2015-03-10 DIAGNOSIS — M79676 Pain in unspecified toe(s): Secondary | ICD-10-CM | POA: Diagnosis not present

## 2015-03-10 NOTE — Progress Notes (Signed)
Patient ID: Jose [name], male   DOB: 08/15/1930, 81 y.o.   MRN: [id number] Complaint:  Visit Type: Patient returns to my office for continued preventative foot care services. Complaint: Patient states" my nails have grown long and thick and become painful to walk and wear shoes" . He presents for preventative foot care services. No changes to ROS  Podiatric Exam: Vascular: dorsalis pedis and posterior tibial pulses are not  palpable bilateral. Capillary return is immediate. Temperature gradient is diminished. Skin turgor  diminished  Sensorium: Normal Semmes Weinstein monofilament test. Normal tactile sensation bilaterally. Nail Exam: Pt has thick disfigured discolored nails with subungual debris noted bilateral entire nail hallux through fifth toenails Ulcer Exam: There is no evidence of ulcer or pre-ulcerative changes or infection. Orthopedic Exam: Muscle tone and strength are WNL. No limitations in general ROM. No crepitus or effusions noted. Foot type and digits show no abnormalities. Bony prominences are unremarkable. Skin: No Porokeratosis. No infection or ulcers  Diagnosis:  Tinea unguium, Pain in right toe, pain in left toes, PVD  Treatment & Plan Procedures and Treatment: Consent by patient was obtained for treatment procedures. The patient understood the discussion of treatment and procedures well. All questions were answered thoroughly reviewed. Debridement of mycotic and hypertrophic toenails, 1 through 5 bilateral and clearing of subungual debris. No ulceration, no infection noted.  Return Visit-Office Procedure: Patient instructed to return to the office for a follow up visit 3 months for continued evaluation and treatment.  Gardiner Barefoot DPM

## 2015-06-06 ENCOUNTER — Encounter: Payer: Self-pay | Admitting: Sports Medicine

## 2015-06-06 ENCOUNTER — Ambulatory Visit (INDEPENDENT_AMBULATORY_CARE_PROVIDER_SITE_OTHER): Payer: Medicare Other | Admitting: Sports Medicine

## 2015-06-06 DIAGNOSIS — M79673 Pain in unspecified foot: Secondary | ICD-10-CM | POA: Diagnosis not present

## 2015-06-06 DIAGNOSIS — I739 Peripheral vascular disease, unspecified: Secondary | ICD-10-CM

## 2015-06-06 DIAGNOSIS — B351 Tinea unguium: Secondary | ICD-10-CM

## 2015-06-06 NOTE — Progress Notes (Signed)
Patient ID: Jose [name], male   DOB: 1930-06-22, 82 y.o.   MRN: [id number] Subjective: Jose [name] is a 82 y.o. male patient seen today in office with complaint of painful thickened and elongated toenails; unable to trim. Patient denies history of known Diabetes or Neuropathy. Patient has no other pedal complaints at this time.   Patient Active Problem List   Diagnosis Date Noted  . Dementia 11/12/2012  . History of bilateral hip replacements 11/12/2012    Current Outpatient Prescriptions on File Prior to Visit  Medication Sig Dispense Refill  . Ascorbic Acid (VITAMIN C) 1000 MG tablet Take 1,000 mg by mouth daily.      Marland Kitchen buPROPion (WELLBUTRIN SR) 150 MG 12 hr tablet Take 150 mg by mouth daily.   0  . Cholecalciferol (VITAMIN D) 1000 UNITS capsule Take 1,000 Units by mouth daily.      Marland Kitchen donepezil (ARICEPT) 23 MG TABS tablet Take 1 tablet (23 mg total) by mouth daily. 90 tablet 3  . dorzolamide-timolol (COSOPT) 22.3-6.8 MG/ML ophthalmic solution Place 1 drop into the left eye 2 (two) times daily.     . Flaxseed, Linseed, (FLAXSEED OIL) 1000 MG CAPS Take 1,000 mg by mouth daily.      . folic acid (FOLVITE) A999333 MCG tablet Take 800 mcg by mouth every evening.     . gabapentin (NEURONTIN) 100 MG capsule Take 1 capsule (100 mg total) by mouth 2 (two) times daily. 180 capsule 3  . hydrocortisone (ANUSOL-HC) 25 MG suppository Place 1 suppository (25 mg total) rectally at bedtime. 30 suppository 1  . memantine (NAMENDA XR) 28 MG CP24 24 hr capsule Take 1 capsule (28 mg total) by mouth daily. 90 capsule 3  . metFORMIN (GLUCOPHAGE) 500 MG tablet Take 500 mg by mouth daily with breakfast.     . mirabegron ER (MYRBETRIQ) 50 MG TB24 tablet Take 50 mg by mouth daily.    . Misc Natural Products (TURMERIC CURCUMIN) CAPS Take 1 capsule by mouth daily.    . Multiple Vitamin (MULTIVITAMIN) tablet Take 1 tablet by mouth daily.      . potassium chloride (KLOR-CON) 10 MEQ CR tablet Take 10 mEq by mouth 2 (two)  times a week.      . Probiotic Product (ALIGN) 4 MG CAPS Take 4 mg by mouth daily.    . simvastatin (ZOCOR) 40 MG tablet Take 40 mg by mouth every evening.      No current facility-administered medications on file prior to visit.    No Known Allergies  Objective: Physical Exam  General: Well developed, nourished, no acute distress, awake, alert and oriented x 3  Vascular: Dorsalis pedis artery 0/4 bilateral, Posterior tibial artery 0/4 bilateral, skin temperature warm to cool proximal to distal bilateral lower extremities, no varicosities, significant trophic skin changes from chronic 2+ pitting edema bilateral, no pedal hair present bilateral.  Neurological: Gross sensation present via light touch bilateral.   Dermatological: Skin is dry, and supple bilateral, Nails 1-10 are tender, long, thick, and discolored with moderate subungal debris, no webspace macerations present bilateral, no open lesions present bilateral, no callus/corns/hyperkeratotic tissue present bilateral. No signs of infection bilateral.  Musculoskeletal: No boney deformities noted bilateral. Muscular strength within normal limits without pain on range of motion. No pain with calf compression bilateral.  Assessment and Plan:  Problem List Items Addressed This Visit    None    Visit Diagnoses    Dermatophytosis of nail    -  Primary    Foot pain, unspecified laterality        PVD (peripheral vascular disease) ([place])          -Examined patient.  -Discussed treatment options for painful mycotic nails. -Mechanically debrided and reduced mycotic nails with sterile nail nipper and dremel nail file without incident. -Recommend lower leg elevation to assist with edema control -Patient to return in 3 months for follow up evaluation or sooner if symptoms worsen.  Landis Martins, DPM

## 2015-06-09 ENCOUNTER — Ambulatory Visit: Payer: Medicare Other | Admitting: Podiatry

## 2015-07-21 ENCOUNTER — Ambulatory Visit (INDEPENDENT_AMBULATORY_CARE_PROVIDER_SITE_OTHER): Payer: Medicare Other | Admitting: Neurology

## 2015-07-21 ENCOUNTER — Encounter: Payer: Self-pay | Admitting: Neurology

## 2015-07-21 VITALS — BP 148/72 | HR 72 | Resp 18 | Ht 69.0 in | Wt 183.0 lb

## 2015-07-21 DIAGNOSIS — F0390 Unspecified dementia without behavioral disturbance: Secondary | ICD-10-CM

## 2015-07-21 MED ORDER — DONEPEZIL HCL 23 MG PO TABS: 23.0000 mg | ORAL_TABLET | Freq: Every day | ORAL | Status: DC

## 2015-07-21 MED ORDER — MEMANTINE HCL ER 28 MG PO CP24: 28.0000 mg | ORAL_CAPSULE | Freq: Every day | ORAL | Status: DC

## 2015-07-21 NOTE — Progress Notes (Signed)
Subjective:    Patient ID: Jose [name] is a 82 y.o. male.  HPI     Interim history:   Jose [name] is a very friendly 82 year old right-handed gentleman with an underlying medical history of left eye retinal disease, arthritis and status post L THR in 2000, R THR in 2011, ET with a positive FHx, PN, HLP, HTN, LBP, depression, reflux disease, and memory loss, who presents for followup consultation of his dementia without behavioral disturbance. He is accompanied by his wife again today. I last saw him on 01/20/2015, at which time he reported doing fairly well, his wife reported that he was less motivated, somewhat weaker overall, but still was working with a trainer 3 times a week, half an hour at a time, mostly working on balance, walking and light weight training. He had lower extremity swelling which was stable, memory scores were stable, he was on maximum doses of long-acting Namenda and Aricept and I suggested he continue with medications. MMSE was 20 out of 30 at the time.   Today, 07/21/2015: He reports doing okay. He has not been sleeping well at night and has been sleeping during the day. He was given a medication by PCP, but she has not started it yet and does not recall its name. He had a tendency to wander at night. Twice he triggered the house alarm at night. She hired a Actuary during the day and also for overnight but after 2 weeks she decided to forego this. She has been making plans to move with him to friend's home [place] to independent living. They are finalizing the process and hopefully will be moving by July of this year. He has been taken off of gabapentin which also helped some of the daytime somnolence and nighttime behaviors. Mood wise he stable. Memory wise he is stable. He does not drink water hardly at all.  Previously:  I saw him on 07/16/2014, at which time his MMSE was 20, clock drawing was 4, animal fluency was 16. He reported a recent syncopal spell. His wife reported that  he had just gotten out of the shower was in the process of dressing himself, sitting on a bench in his room. When she left the room he slumped back onto his bed and was unresponsive. She called 911. She did report that he had a tendency to take very hot showers. He does not always drink enough water. He had been off of his blood pressure medication. They had talked about using compression stockings to his lower extremities but his problem was that they were difficult to get on. Memory was stable. His leg swelling had become worse because he was no longer on a diuretic. I suggested no medication changes. I asked him to continue his long-acting Namenda and Aricept at 23 mg daily. I advised him to drink more water and consider compression stockings to the lower extremities.  I saw him on 11/12/13, at which time his wife reported that he was sleeping more and more and not motivated to do things. He fell once as he held onto the towel rack in the bathroom and tore the rack off the wall and bumped his head. No laceration, no HA, no LOC, no injuries. His memory scores were stable and I kept him on the same medications. In the interim, on 02/21/2014 he presented to the emergency room with painless rectal bleeding. I reviewed the emergency room records. He was hemodynamically stable. He was set up with GI follow-up.  In the interim, he presented to the emergency room again on 06/24/2014 with near syncope versus actual syncopal event at home. I reviewed the emergency room records. His wife called EMS and the blood pressure was reportedly 82/56 and blood sugar level was 97. His creatinine was 1.44 and this was up from his baseline. He was given IV fluids and improved. His blood pressure improved.   I saw him on 05/14/2013, at which time I did not make any medication changes. He was doing well per wife. He continued to be active. I did encourage him to drink more water. I kept him on long-acting Namenda 28 mg and Aricept  23 mg daily.  I first met him on 11/12/2012, which time I felt that his MMSE was a little worse and his category fluency was also less than before. His MMSE score was 23/30, CDT was 4/4, AFT (Animal Fluency Test) score was 15. He has been on maximum doses of Aricept and Namenda and he did not qualify for the clinical trial for dementia. I was reluctant to switch him to another anti-cholinergic medication for fear of side effects.   He previously followed with Dr. Morene Antu and was last seen by him on 05/14/2012, which time Dr. Erling Cruz felt that he was a potential candidate for a clinical trial. The patient's MMSE score was 26, clock drawing was 4 out of 4, and his falls assessment tool score was 12.   He started having progressive memory loss in April 2000. This was after his hip surgery. His second hip surgery was done under spinal anesthesia with good results. EMG and nerve conduction studies as well as lumbar spine MRI in November 2012 showed multilevel spondylosis, as moderately severe canal stenosis and broad-based disc bulge at L1-2. B12 level, RPR, TSH were normal. MRI brain with and without contrast on February 2004 showed atrophy and small vessel disease. He had a repeat brain MRI in October 2012. He has been on Aricept since 2004 and [place] since March 2005. He also has peripheral neuropathy for which he had evaluation in January 2009 with SPEP, ACE level, vitamin B12, hemoglobin A1c, PSA, TSH, lipids, CBC, CMP, urinalysis, all normal in February 2010. In December 2012 his MMSE was 45, and in April 2013 his MMSE was 22, clock drawing was 4, animal fluency was 18.   He was started on low dose Lexapro by Dr. Joylene Draft. The patient has had a tendency to pick at his skin lesions on the scalp. His son and daughter are local and involved. 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 He no longer drives. He goes for aqua exercises 3 times/week. He has had no VH/AH. Eating too much and forgetting that he ate has  been an issue per wife. He had a sleep study in the past in 2013, which was negative for OSA per wife.    His Past Medical History Is Significant For: Past Medical History  Diagnosis Date  . Memory loss   . 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 Spinal stenosis, lumbar region, without neurogenic claudication   . Abnormality of gait   . Backache, unspecified   . Unspecified hereditary and idiopathic peripheral neuropathy   . Pain in joint, pelvic region and thigh   . Essential and other specified forms of tremor   . Dementia 11/12/2012  . History of bilateral hip replacements 11/12/2012  . Colon polyps     adenomatous  . Hemorrhoids   .  Diverticulosis   . Skin cancer of scalp     His Past Surgical History Is Significant For: Past Surgical History  Procedure Laterality Date  . Total hip arthroplasty Left 2000  . Total hip arthroplasty (aka replacement) Right 2011  . Cataract extraction, bilateral  2012  . Retinal laser procedure  2012    retinal wrinkle  . Skin cancer excision      His Family History Is Significant For: Family History  Problem Relation Age of Onset  . Heart failure Mother     His Social History Is Significant For: Social History   Social History  . Marital Status: Married    Spouse Name: [name]  . Number of Children: 2  . Years of Education: lawyer   Occupational History  . Retired     retired   Social History Main Topics  . Smoking status: Former Smoker    Types: Cigarettes  . Smokeless tobacco: Never Used  . Alcohol Use: 8.4 oz/week    14 Glasses of wine per week     Comment: occas, one glass of wine before dinner,sometimes 1/2 glass  . Drug Use: No  . Sexual Activity: Not Asked   Other Topics Concern  . None   Social History Narrative   Patient is right handed and resides in home with wife    His Allergies Are:  No Known Allergies:   His Current Medications Are:  Outpatient Encounter  Prescriptions as of 07/21/2015  Medication Sig  . Ascorbic Acid (VITAMIN C) 1000 MG tablet Take 1,000 mg by mouth daily.    Marland Kitchen buPROPion (WELLBUTRIN SR) 150 MG 12 hr tablet Take 150 mg by mouth daily.   . Cholecalciferol (VITAMIN D) 1000 UNITS capsule Take 1,000 Units by mouth daily.    Marland Kitchen donepezil (ARICEPT) 23 MG TABS tablet Take 1 tablet (23 mg total) by mouth daily.  . dorzolamide-timolol (COSOPT) 22.3-6.8 MG/ML ophthalmic solution Place 1 drop into the left eye 2 (two) times daily.   . Flaxseed, Linseed, (FLAXSEED OIL) 1000 MG CAPS Take 1,000 mg by mouth daily.    . folic acid (FOLVITE) 366 MCG tablet Take 800 mcg by mouth every evening.   . hydrocortisone (ANUSOL-HC) 25 MG suppository Place 1 suppository (25 mg total) rectally at bedtime.  . memantine (NAMENDA XR) 28 MG CP24 24 hr capsule Take 1 capsule (28 mg total) by mouth daily.  . metFORMIN (GLUCOPHAGE) 500 MG tablet Take 500 mg by mouth daily with breakfast.   . mirabegron ER (MYRBETRIQ) 50 MG TB24 tablet Take 50 mg by mouth daily.  . Misc Natural Products (TURMERIC CURCUMIN) CAPS Take 1 capsule by mouth daily.  . Multiple Vitamin (MULTIVITAMIN) tablet Take 1 tablet by mouth daily.    . potassium chloride (KLOR-CON) 10 MEQ CR tablet Take 10 mEq by mouth 2 (two) times a week.    . Probiotic Product (ALIGN) 4 MG CAPS Take 4 mg by mouth daily.  . simvastatin (ZOCOR) 40 MG tablet Take 40 mg by mouth every evening.   . [DISCONTINUED] gabapentin (NEURONTIN) 100 MG capsule Take 1 capsule (100 mg total) by mouth 2 (two) times daily.   No facility-administered encounter medications on file as of 07/21/2015.  :  Review of Systems:  Out of a complete 14 point review of systems, all are reviewed and negative with the exception of these symptoms as listed below:   Review of Systems  Neurological:       No new concerns per patient  and wife.     Objective:  Neurologic Exam  Physical Exam Physical Examination:   Filed Vitals:   07/21/15  1256  BP: 148/72  Pulse: 72  Resp: 18   General Examination: The patient is a very pleasant 82 y.o. male in no acute distress. He is calm and cooperative with the exam. He denies Auditory Hallucinations and Visual Hallucinations. He is in good spirits today, less verbal, but attentive, and answers questions quite appropriately.Marland Kitchen  HEENT: Normocephalic, atraumatic, pupils are equal, round and reactive to light and accommodation. Extraocular tracking shows mild saccadic breakdown without nystagmus noted. Hearing is intact. Tympanic membranes are clear bilaterally. Face is symmetric with no facial masking and normal facial sensation. There is no lip, neck or jaw tremor. Neck is not rigid with intact passive ROM. There are no carotid bruits on auscultation. Oropharynx exam reveals mild to moderate mouth dryness. No significant airway crowding is noted. Mallampati is class I. Tongue protrudes centrally and palate elevates symmetrically.    Chest: is clear to auscultation without wheezing, rhonchi or crackles noted.  Heart: sounds are regular and normal without murmurs, rubs or gallops noted.   Abdomen: is soft, non-tender and non-distended with normal bowel sounds appreciated on auscultation.  Extremities: There is 1 to 2 + pitting edema in the distal lower extremities bilaterally,stable.  Skin: is warm and dry with no trophic changes noted. He has multiple scars on his scalp. He has precancerous lesions which are usually taken off by his dermatologist.  Musculoskeletal: exam reveals no obvious joint deformities, tenderness or joint swelling or erythema.  Neurologically:  Mental status: The patient is awake and alert, paying fair attention. He is able to partially provide the history. His wife provides most of his Hx. He is oriented to: person, place and day of week. His memory, attention, language and knowledge are impaired. There is no aphasia, agnosia, apraxia or anomia. There is a mild degree of  bradyphrenia. Speech is mildly hypophonic with no dysarthria noted. Mood is congruent and affect is normal.   On 11/12/12: His MMSE score was 23/30. CDT was 4/4. AFT (Animal Fluency Test) score was 15.  On 05/14/13: MMSE was 19/30, CDT 4/4, AFT 15. On 11/12/2013: MMSE is 20/30, CDT 4/4, AFT 16.  On 07/16/2014: MMSE: 20/30, CDT: 4/4, AFT: 16/min. On 01/20/2015: MMSE: 20/30, CDT: 3/4, AFT: 12/min. On 07/21/2015: MMSE: 20/30, CDT: 4/4, AFT: 9/min.  Cranial nerves are as described above under HEENT exam. In addition, shoulder shrug is normal with equal shoulder height noted.  Motor exam: Normal bulk, and strength for age is noted. Tone is not rigid with absence of cogwheeling in the extremities. There is overall no significant bradykinesia. There is no drift or rebound. There is a mild postural and action tremor in both upper extremities. There is no significant resting tremor. Romberg is negative. Reflexes are 1+ in the upper extremities and 1+ in the lower extremities. Fine motor skills are mildly impaired bilaterally.   There is no truncal or gait ataxia.   Sensory exam is intact to light touch, but decreased mildly to vibration, PP and temperature sense in both feet.   Gait, station and balance: He stands up from the seated position with mild difficulty and posture is mildly stooped. Stance is narrow-based. He turns in 3 steps. Balance is fairly well preserved. He uses his cane barely.  Assessment and Plan:   In summary, Jose [name] is a very pleasant 82 year old male with an underlying medical  history of chronic back pain, arthritis, depression, reflux disease, and hypertension who has a long-standing history of dementia, of approximately 16 to 17 years duration, think fully, without any evidence of sinister behavioral issues or mood issues. His memory numbers have been fairly stable and generally speaking, he has done fairly well on a combination of long-acting Namenda 28 mg and Aricept 23 mg  daily, tolerating medications well. I renewed the prescriptions. He is on the maximum doses of Aricept and Namenda and I suggested we continue with the current medications. Lately, he was not sleeping well at night and had a tendency to wander. Wife had high at 2 sisters which did not help very much. They are in the process of moving to friend's home [place]. I think this is a good idea because he would be at least safer at night and she would not have to worry about him triggering the alarm at the house. He is off of gabapentin which also helped the daytime somnolence and some of the wandering at night it looks like. He was given a prescription for sleep at night but wife does not recall the name. She is requested to call us back with the name of the medication. She has not tried it yet and is reluctant to start it because it is not without side effects potentially. I suggested he could also try melatonin for sleep. She is agreeable to trying this. He is again advised and encouraged to increase his water intake. He likes to drink coffee and sweetened tea. Physical exam and blood pressure are stable. His son usually takes him to the gym and also works out at the same time so he can also keep an eye on him.  both his daughter and his son live close by and help out, thankfully. They have grandchildren in [place] and [place]. Thankfully he has a very caring and involved family and a good support system overall and he has been able to tolerate both medications at the current doses. I would like to see him back in 6 months, sooner if the need arises. I encouraged him to call with any interim questions, concerns, problems, updates, or refill requests.  I spent 25 minutes in total face-to-face time with the patient, more than 50% of which was spent in counseling and coordination of care, reviewing test results, reviewing medication and discussing or reviewing the diagnosis of dementia, the prognosis and treatment  options.

## 2015-07-21 NOTE — Patient Instructions (Addendum)
We will continue your memory medications; thankfully, your exam is stable.   Please let me know, which medication Dr. Joylene Draft has suggested for sleep.   You can try Melatonin at night for sleep: take 1 mg to 3 mg, one to 2 hours before your bedtime. You can go up to 5 mg if needed. It is over the counter and comes in pill form, chewable form and spray, if you prefer.    I think it is a good idea for you to move to [facility].

## 2015-07-27 ENCOUNTER — Telehealth: Payer: Self-pay | Admitting: Neurology

## 2015-07-27 MED ORDER — MELATONIN 10 MG PO TABS: 10.0000 mg | ORAL_TABLET | Freq: Every evening | ORAL | Status: DC | PRN

## 2015-07-27 NOTE — Addendum Note (Signed)
Addended by: Laurence Spates on: 07/27/2015 04:23 PM   Modules accepted: Orders

## 2015-07-27 NOTE — Telephone Encounter (Signed)
Patient's wife is calling. She says the medication that Dr. Abner Greenspan had suggested is generic Seroquel. She also stated the Melatonin 5mg  is helping the patient a little bit. Should the patient try Melatonin 10mg ?  Please call and advise.

## 2015-07-27 NOTE — Telephone Encounter (Signed)
Let's avoid the Seroquel for now. He can go up to 10 mg of the Melatonin. Please let wife know, thx

## 2015-07-27 NOTE — Telephone Encounter (Signed)
LM for wife to call back

## 2015-07-27 NOTE — Telephone Encounter (Signed)
This is in response to questions from 4/20 appt. How do you advise?

## 2015-07-27 NOTE — Telephone Encounter (Signed)
Wife called back and she is aware of recommendation below. Voiced understanding.

## 2015-08-30 ENCOUNTER — Encounter: Payer: Self-pay | Admitting: Sports Medicine

## 2015-08-30 ENCOUNTER — Ambulatory Visit (INDEPENDENT_AMBULATORY_CARE_PROVIDER_SITE_OTHER): Payer: Medicare Other | Admitting: Sports Medicine

## 2015-08-30 DIAGNOSIS — M79673 Pain in unspecified foot: Secondary | ICD-10-CM | POA: Diagnosis not present

## 2015-08-30 DIAGNOSIS — B351 Tinea unguium: Secondary | ICD-10-CM | POA: Diagnosis not present

## 2015-08-30 DIAGNOSIS — I739 Peripheral vascular disease, unspecified: Secondary | ICD-10-CM

## 2015-08-30 NOTE — Progress Notes (Signed)
Patient ID: KEBRON SASSONE, male   DOB: [birth year], 82 y.o.   MRN: [id number]  Subjective: LARNELL SITZMAN is a 82 y.o. male patient seen today in office with complaint of painful thickened and elongated toenails; unable to trim. Patient denies changes with medical history since last visit. Patient has no other pedal complaints at this time.   Patient Active Problem List   Diagnosis Date Noted  . Dementia 11/12/2012  . History of bilateral hip replacements 11/12/2012    Current Outpatient Prescriptions on File Prior to Visit  Medication Sig Dispense Refill  . Ascorbic Acid (VITAMIN C) 1000 MG tablet Take 1,000 mg by mouth daily.      Marland Kitchen buPROPion (WELLBUTRIN SR) 150 MG 12 hr tablet Take 150 mg by mouth daily.   0  . Cholecalciferol (VITAMIN D) 1000 UNITS capsule Take 1,000 Units by mouth daily.      Marland Kitchen donepezil (ARICEPT) 23 MG TABS tablet Take 1 tablet (23 mg total) by mouth daily. 90 tablet 3  . dorzolamide-timolol (COSOPT) 22.3-6.8 MG/ML ophthalmic solution Place 1 drop into the left eye 2 (two) times daily.     . Flaxseed, Linseed, (FLAXSEED OIL) 1000 MG CAPS Take 1,000 mg by mouth daily.      . folic acid (FOLVITE) A999333 MCG tablet Take 800 mcg by mouth every evening.     . hydrocortisone (ANUSOL-HC) 25 MG suppository Place 1 suppository (25 mg total) rectally at bedtime. 30 suppository 1  . Melatonin 10 MG TABS Take 10 mg by mouth at bedtime as needed (Take 1-2 hours before bedtime). 30 tablet 0  . memantine (NAMENDA XR) 28 MG CP24 24 hr capsule Take 1 capsule (28 mg total) by mouth daily. 90 capsule 3  . metFORMIN (GLUCOPHAGE) 500 MG tablet Take 500 mg by mouth daily with breakfast.     . mirabegron ER (MYRBETRIQ) 50 MG TB24 tablet Take 50 mg by mouth daily.    . Misc Natural Products (TURMERIC CURCUMIN) CAPS Take 1 capsule by mouth daily.    . Multiple Vitamin (MULTIVITAMIN) tablet Take 1 tablet by mouth daily.      . potassium chloride (KLOR-CON) 10 MEQ CR tablet Take 10 mEq by mouth 2 (two)  times a week.      . Probiotic Product (ALIGN) 4 MG CAPS Take 4 mg by mouth daily.    . simvastatin (ZOCOR) 40 MG tablet Take 40 mg by mouth every evening.      No current facility-administered medications on file prior to visit.    No Known Allergies  Objective: Physical Exam  General: Well developed, nourished, no acute distress, awake, alert and oriented x 3  Vascular: Dorsalis pedis artery 0/4 bilateral, Posterior tibial artery 0/4 bilateral, skin temperature warm to cool proximal to distal bilateral lower extremities, no varicosities, significant trophic skin changes from chronic 2+ pitting edema bilateral, no pedal hair present bilateral.  Neurological: Gross sensation present via light touch bilateral.   Dermatological: Skin is dry, and supple bilateral, Nails 1-10 are tender, long, thick, and discolored with moderate subungal debris, no webspace macerations present bilateral, no open lesions present bilateral, no callus/corns/hyperkeratotic tissue present bilateral. No signs of infection bilateral.  Musculoskeletal: No boney deformities noted bilateral. Muscular strength within normal limits without pain on range of motion. No pain with calf compression bilateral.  Assessment and Plan:  Problem List Items Addressed This Visit    None    Visit Diagnoses    Dermatophytosis of nail    -  Primary    Foot pain, unspecified laterality        PVD (peripheral vascular disease) ([place])          -Examined patient.  -Discussed treatment options for painful mycotic nails. -Mechanically debrided and reduced mycotic nails with sterile nail nipper and dremel nail file without incident. -Recommend lower leg elevation to assist with edema control -Patient to return in 3 months for follow up evaluation or sooner if symptoms worsen.  Landis Martins, DPM

## 2015-09-05 ENCOUNTER — Ambulatory Visit: Payer: Medicare Other | Admitting: Sports Medicine

## 2015-09-27 ENCOUNTER — Telehealth: Payer: Self-pay

## 2015-09-27 DIAGNOSIS — F0390 Unspecified dementia without behavioral disturbance: Secondary | ICD-10-CM

## 2015-09-27 MED ORDER — MEMANTINE HCL ER 28 MG PO CP24: 28.0000 mg | ORAL_CAPSULE | Freq: Every day | ORAL | Status: DC

## 2015-09-27 NOTE — Telephone Encounter (Signed)
Refill request from pharmacy /

## 2015-10-19 ENCOUNTER — Other Ambulatory Visit: Payer: Self-pay

## 2015-10-19 DIAGNOSIS — F0390 Unspecified dementia without behavioral disturbance: Secondary | ICD-10-CM

## 2015-10-19 MED ORDER — DONEPEZIL HCL 23 MG PO TABS: 23.0000 mg | ORAL_TABLET | Freq: Every day | ORAL | Status: DC

## 2015-11-22 ENCOUNTER — Telehealth: Payer: Self-pay

## 2015-11-22 NOTE — Telephone Encounter (Signed)
I called and male answered the phone. Patient was not their at the time. I need to reschedule time of appt on 10/19, Dr. Rexene Alberts is in a meeting between 11:30 and 2pm. Male advised me that wife will call back and reschedule that appt.

## 2015-11-28 ENCOUNTER — Ambulatory Visit (INDEPENDENT_AMBULATORY_CARE_PROVIDER_SITE_OTHER): Payer: Medicare Other | Admitting: Sports Medicine

## 2015-11-28 DIAGNOSIS — B351 Tinea unguium: Secondary | ICD-10-CM | POA: Diagnosis not present

## 2015-11-28 DIAGNOSIS — M79676 Pain in unspecified toe(s): Secondary | ICD-10-CM

## 2015-11-28 NOTE — Telephone Encounter (Signed)
I called back and spoke to wife. We were able to move appt to another time.

## 2015-11-28 NOTE — Progress Notes (Signed)
Patient ID: Jose [name], male   DOB: [birth year], 82 y.o.   MRN: [id number]  Subjective: Jose [name] is a 82 y.o. male patient seen today in office with complaint of painful thickened and elongated toenails; unable to trim. Patient denies changes with medical history since last visit. Patient has no other pedal complaints at this time.   Patient Active Problem List   Diagnosis Date Noted  . Dementia 11/12/2012  . History of bilateral hip replacements 11/12/2012    Current Outpatient Prescriptions on File Prior to Visit  Medication Sig Dispense Refill  . Ascorbic Acid (VITAMIN C) 1000 MG tablet Take 1,000 mg by mouth daily.      Marland Kitchen buPROPion (WELLBUTRIN SR) 150 MG 12 hr tablet Take 150 mg by mouth daily.   0  . Cholecalciferol (VITAMIN D) 1000 UNITS capsule Take 1,000 Units by mouth daily.      Marland Kitchen donepezil (ARICEPT) 23 MG TABS tablet Take 1 tablet (23 mg total) by mouth daily. 90 tablet 3  . dorzolamide-timolol (COSOPT) 22.3-6.8 MG/ML ophthalmic solution Place 1 drop into the left eye 2 (two) times daily.     . Flaxseed, Linseed, (FLAXSEED OIL) 1000 MG CAPS Take 1,000 mg by mouth daily.      . folic acid (FOLVITE) A999333 MCG tablet Take 800 mcg by mouth every evening.     . hydrocortisone (ANUSOL-HC) 25 MG suppository Place 1 suppository (25 mg total) rectally at bedtime. 30 suppository 1  . Melatonin 10 MG TABS Take 10 mg by mouth at bedtime as needed (Take 1-2 hours before bedtime). 30 tablet 0  . memantine (NAMENDA XR) 28 MG CP24 24 hr capsule Take 1 capsule (28 mg total) by mouth daily. 90 capsule 3  . metFORMIN (GLUCOPHAGE) 500 MG tablet Take 500 mg by mouth daily with breakfast.     . mirabegron ER (MYRBETRIQ) 50 MG TB24 tablet Take 50 mg by mouth daily.    . Misc Natural Products (TURMERIC CURCUMIN) CAPS Take 1 capsule by mouth daily.    . Multiple Vitamin (MULTIVITAMIN) tablet Take 1 tablet by mouth daily.      . potassium chloride (KLOR-CON) 10 MEQ CR tablet Take 10 mEq by mouth 2 (two)  times a week.      . Probiotic Product (ALIGN) 4 MG CAPS Take 4 mg by mouth daily.    . simvastatin (ZOCOR) 40 MG tablet Take 40 mg by mouth every evening.      No current facility-administered medications on file prior to visit.     No Known Allergies  Objective: Physical Exam  General: Well developed, nourished, no acute distress, awake, alert and oriented x 3  Vascular: Dorsalis pedis artery 0/4 bilateral, Posterior tibial artery 0/4 bilateral, skin temperature warm to cool proximal to distal bilateral lower extremities, no varicosities, significant trophic skin changes from chronic 2+ pitting edema bilateral, no pedal hair present bilateral.  Neurological: Gross sensation present via light touch bilateral.   Dermatological: Skin is dry, and supple bilateral, Nails 1-10 are tender, long, thick, and discolored with moderate subungal debris, no webspace macerations present bilateral, no open lesions present bilateral, no callus/corns/hyperkeratotic tissue present bilateral. No signs of infection bilateral.  Musculoskeletal: No boney deformities noted bilateral. Muscular strength within normal limits without pain on range of motion. No pain with calf compression bilateral.  Assessment and Plan:  Problem List Items Addressed This Visit    None    Visit Diagnoses    Pain due to onychomycosis  of toenail    -  Primary     -Examined patient.  -Discussed treatment options for painful mycotic nails. -Mechanically debrided and reduced mycotic nails with sterile nail nipper and dremel nail file without incident. -Recommend lower leg elevation to assist with edema control -Patient to return in 3 months for follow up evaluation or sooner if symptoms worsen.  Landis Martins, DPM

## 2016-01-19 ENCOUNTER — Ambulatory Visit: Payer: Medicare Other | Admitting: Neurology

## 2016-01-19 ENCOUNTER — Encounter: Payer: Self-pay | Admitting: Neurology

## 2016-01-19 ENCOUNTER — Ambulatory Visit (INDEPENDENT_AMBULATORY_CARE_PROVIDER_SITE_OTHER): Payer: Medicare Other | Admitting: Neurology

## 2016-01-19 ENCOUNTER — Encounter (INDEPENDENT_AMBULATORY_CARE_PROVIDER_SITE_OTHER): Payer: Self-pay

## 2016-01-19 VITALS — BP 142/68 | HR 80 | Resp 18 | Ht 69.0 in | Wt 184.0 lb

## 2016-01-19 DIAGNOSIS — F0390 Unspecified dementia without behavioral disturbance: Secondary | ICD-10-CM

## 2016-01-19 NOTE — Patient Instructions (Addendum)
We will keep you on the 2 memory medications.  Please try to add more exercise to your daily routine, explore the exercise room at [facility]. Talk to the PT over there too. Monitor the L leg swelling.  Try to drink more water, if possible.

## 2016-01-19 NOTE — Progress Notes (Signed)
Subjective:    Patient ID: Jose [name] is a 82 y.o. male.  HPI     Interim history:  Jose [name] is a very friendly 82 year old right-handed gentleman with an underlying medical history of left eye retinal disease, arthritis and status post L THR in 2000, R THR in 2011, ET with a positive FHx, PN, HLP, HTN, LBP, depression, reflux disease, and memory loss, who presents for followup consultation of his dementia without behavioral disturbance. He is accompanied by his wife again today. I last saw him on 07/21/2015, at which time he reported doing okay. He had not been sleeping well at night and was napping during the day. He was given a medication by PCP, but had not started it did not recall its name. He had a tendency to wander at night. Twice he triggered the house alarm at night. They had a sitter during the day and also for overnight but after 2 weeks they decided to forego this. They were in the process of moving to [place] to independent living. He had been taken off of gabapentin which helped the daytime somnolence and nighttime behaviors. Moodwise and memory wise he was stable. Was not drinking enough water. His MMSE was 20/30, CDT: 4/4, AFT: 9/min. I suggested he continue with his memory medications, I suggested he try melatonin at night for sleep. They were reminded to call back with the medication Dr. Joylene Draft had suggested for sleep. His wife called on 07/27/2015 reporting that he had been prescribed Seroquel. I suggested we try to hold off on this for now.  Today, 01/19/2016: He has no new complaints. Wife reports that he has been sleeping better at night unless in the daytime. [facility] transitioned [facility] to [facility], independent living. Their daughter is living in their home now. Daughter helped with the transition and move. Patient does not always drink enough water, likes to drink tea with lunch and dinner and coffee in the morning, in between very little water. He has not  been exercising regularly. They have access to the exercise room but have not explored it yet. His left leg swelling has become worse. She is wondering if he should get physical therapy for it. Memory wise, he may be a little bit worse per her report.   Previously:   I saw him on 01/20/2015, at which time he reported doing fairly well, his wife reported that he was less motivated, somewhat weaker overall, but still was working with a trainer 3 times a week, half an hour at a time, mostly working on balance, walking and light weight training. He had lower extremity swelling which was stable, memory scores were stable, he was on maximum doses of long-acting Namenda and Aricept and I suggested he continue with medications. MMSE was 20 out of 30 at the time.    I saw him on 07/16/2014, at which time his MMSE was 20, clock drawing was 4, animal fluency was 16. He reported a recent syncopal spell. His wife reported that he had just gotten out of the shower was in the process of dressing himself, sitting on a bench in his room. When she left the room he slumped back onto his bed and was unresponsive. She called 911. She did report that he had a tendency to take very hot showers. He does not always drink enough water. He had been off of his blood pressure medication. They had talked about using compression stockings to his lower extremities but his problem  was that they were difficult to get on. Memory was stable. His leg swelling had become worse because he was no longer on a diuretic. I suggested no medication changes. I asked him to continue his long-acting Namenda and Aricept at 23 mg daily. I advised him to drink more water and consider compression stockings to the lower extremities.   I saw him on 11/12/13, at which time his wife reported that he was sleeping more and more and not motivated to do things. He fell once as he held onto the towel rack in the bathroom and tore the rack off the wall and bumped his  head. No laceration, no HA, no LOC, no injuries. His memory scores were stable and I kept him on the same medications. In the interim, on 02/21/2014 he presented to the emergency room with painless rectal bleeding. I reviewed the emergency room records. He was hemodynamically stable. He was set up with GI follow-up.   In the interim, he presented to the emergency room again on 06/24/2014 with near syncope versus actual syncopal event at home. I reviewed the emergency room records. His wife called EMS and the blood pressure was reportedly 82/56 and blood sugar level was 97. His creatinine was 1.44 and this was up from his baseline. He was given IV fluids and improved. His blood pressure improved.     I saw him on 05/14/2013, at which time I did not make any medication changes. He was doing well per wife. He continued to be active. I did encourage him to drink more water. I kept him on long-acting Namenda 28 mg and Aricept 23 mg daily.   I first met him on 11/12/2012, which time I felt that his MMSE was a little worse and his category fluency was also less than before. His MMSE score was 23/30, CDT was 4/4, AFT (Animal Fluency Test) score was 15. He has been on maximum doses of Aricept and Namenda and he did not qualify for the clinical trial for dementia. I was reluctant to switch him to another anti-cholinergic medication for fear of side effects.   He previously followed with Dr. Morene Antu and was last seen by him on 05/14/2012, which time Dr. Erling Cruz felt that he was a potential candidate for a clinical trial. The patient's MMSE score was 26, clock drawing was 4 out of 4, and his falls assessment tool score was 12.   He started having progressive memory loss in April 2000. This was after his hip surgery. His second hip surgery was done under spinal anesthesia with good results. EMG and nerve conduction studies as well as lumbar spine MRI in November 2012 showed multilevel spondylosis, as moderately severe  canal stenosis and broad-based disc bulge at L1-2. B12 level, RPR, TSH were normal. MRI brain with and without contrast on February 2004 showed atrophy and small vessel disease. He had a repeat brain MRI in October 2012. He has been on Aricept since 2004 and [place] since March 2005. He also has peripheral neuropathy for which he had evaluation in January 2009 with SPEP, ACE level, vitamin B12, hemoglobin A1c, PSA, TSH, lipids, CBC, CMP, urinalysis, all normal in February 2010. In December 2012 his MMSE was 40, and in April 2013 his MMSE was 22, clock drawing was 4, animal fluency was 18.   He was started on low dose Lexapro by Dr. Joylene Draft. The patient has had a tendency to pick at his skin lesions on the scalp. His son and daughter  are local and involved. 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 He no longer drives. He goes for aqua exercises 3 times/week. He has had no VH/AH. Eating too much and forgetting that he ate has been an issue per wife. He had a sleep study in the past in 2013, which was negative for OSA per wife.     His Past Medical History Is Significant For: Past Medical History:  Diagnosis Date  . Abnormality of gait   . Backache, unspecified   . Colon polyps    adenomatous  . Dementia 11/12/2012  . Diverticulosis   . Essential and other specified forms of tremor   . Hemorrhoids   . History of bilateral hip replacements 11/12/2012  . Memory loss   . 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 Pain in joint, pelvic region and thigh   . Skin cancer of scalp   . Spinal stenosis, lumbar region, without neurogenic claudication   . Unspecified hereditary and idiopathic peripheral neuropathy     His Past Surgical History Is Significant For: Past Surgical History:  Procedure Laterality Date  . CATARACT EXTRACTION, BILATERAL  2012  . RETINAL LASER PROCEDURE  2012   retinal wrinkle  . SKIN CANCER EXCISION    . TOTAL HIP ARTHROPLASTY Left 2000  .  TOTAL HIP ARTHROPLASTY (aka REPLACEMENT) Right 2011    His Family History Is Significant For: Family History  Problem Relation Age of Onset  . Heart failure Mother     His Social History Is Significant For: Social History   Social History  . Marital status: Married    Spouse name: [name]  . Number of children: 2  . Years of education: lawyer   Occupational History  . Retired [employer]    retired   Social History Main Topics  . Smoking status: Former Smoker    Types: Cigarettes  . Smokeless tobacco: Never Used  . Alcohol use 8.4 oz/week    14 Glasses of wine per week     Comment: occas, one glass of wine before dinner,sometimes 1/2 glass  . Drug use: No  . Sexual activity: Not Asked   Other Topics Concern  . None   Social History Narrative   Patient is right handed and resides in home with wife    His Allergies Are:  No Known Allergies:   His Current Medications Are:  Outpatient Encounter Prescriptions as of 01/19/2016  Medication Sig  . Ascorbic Acid (VITAMIN C) 1000 MG tablet Take 1,000 mg by mouth daily.    Marland Kitchen buPROPion (WELLBUTRIN SR) 150 MG 12 hr tablet Take 150 mg by mouth daily.   . Cholecalciferol (VITAMIN D) 1000 UNITS capsule Take 1,000 Units by mouth daily.    Marland Kitchen donepezil (ARICEPT) 23 MG TABS tablet Take 1 tablet (23 mg total) by mouth daily.  . dorzolamide-timolol (COSOPT) 22.3-6.8 MG/ML ophthalmic solution Place 1 drop into the left eye 2 (two) times daily.   . Flaxseed, Linseed, (FLAXSEED OIL) 1000 MG CAPS Take 1,000 mg by mouth daily.    . folic acid (FOLVITE) 706 MCG tablet Take 800 mcg by mouth every evening.   . hydrocortisone (ANUSOL-HC) 25 MG suppository Place 1 suppository (25 mg total) rectally at bedtime.  . memantine (NAMENDA XR) 28 MG CP24 24 hr capsule Take 1 capsule (28 mg total) by mouth daily.  . metFORMIN (GLUCOPHAGE) 500 MG tablet Take 500 mg by mouth daily with breakfast.   .  mirabegron ER (MYRBETRIQ) 50 MG TB24 tablet Take 50  mg by mouth daily.  . Misc Natural Products (TURMERIC CURCUMIN) CAPS Take 1 capsule by mouth daily.  . Multiple Vitamin (MULTIVITAMIN) tablet Take 1 tablet by mouth daily.    . potassium chloride (KLOR-CON) 10 MEQ CR tablet Take 10 mEq by mouth 2 (two) times a week.    . Probiotic Product (ALIGN) 4 MG CAPS Take 4 mg by mouth daily.  . simvastatin (ZOCOR) 40 MG tablet Take 40 mg by mouth every evening.   . [DISCONTINUED] Melatonin 10 MG TABS Take 10 mg by mouth at bedtime as needed (Take 1-2 hours before bedtime).   No facility-administered encounter medications on file as of 01/19/2016.   : Review of Systems:  Out of a complete 14 point review of systems, all are reviewed and negative with the exception of these symptoms as listed below:  Review of Systems  Neurological:       Wife is asking asking about PT for the patient.  Patient has had increased swelling in his L leg.      Objective:  Neurologic Exam  Physical Exam Physical Examination:   Vitals:   01/19/16 1523  BP: (!) 142/68  Pulse: 80  Resp: 18   General Examination: The patient is a very pleasant 82 y.o. male in no acute distress. He is calm and cooperative with the exam. Quiet, but pleasant and answers in brief sentences.   HEENT: Normocephalic, atraumatic, pupils are equal, round and reactive to light and accommodation. Extraocular tracking shows mild saccadic breakdown without nystagmus noted. Hearing is intact. Tympanic membranes are clear bilaterally. Face is symmetric with no facial masking and normal facial sensation. There is no lip, neck or jaw tremor. Neck is not rigid with intact passive ROM. There are no carotid bruits on auscultation. Oropharynx exam reveals mild to moderate mouth dryness. No significant airway crowding is noted. Mallampati is class I. Tongue protrudes centrally and palate elevates symmetrically.    Chest: is clear to auscultation without wheezing, rhonchi or crackles noted.  Heart: sounds  are regular and normal without murmurs, rubs or gallops noted.   Abdomen: is soft, non-tender and non-distended with normal bowel sounds appreciated on auscultation.  Extremities: There is 1+ pitting edema in the distal lower extremity on the R, 2+ on the L.  Skin: is warm and dry with no trophic changes noted. He has multiple scars on his scalp. He has precancerous lesions which are usually taken off by his dermatologist.  Musculoskeletal: exam reveals no obvious joint deformities, tenderness or joint swelling or erythema.  Neurologically:  Mental status: The patient is awake and alert, paying fair attention. He is able to partially provide the history. His wife provides most of his Hx. He is oriented to: person, place and day of week. His memory, attention, language and knowledge are impaired. There is no aphasia, agnosia, apraxia or anomia. There is a mild degree of bradyphrenia. Speech is mildly hypophonic with no dysarthria noted. Mood is congruent and affect is normal.   On 11/12/12: His MMSE score was 23/30. CDT was 4/4. AFT (Animal Fluency Test) score was 15.  On 05/14/13: MMSE was 19/30, CDT 4/4, AFT 15. On 11/12/2013: MMSE is 20/30, CDT 4/4, AFT 16.  On 07/16/2014: MMSE: 20/30, CDT: 4/4, AFT: 16/min. On 01/20/2015: MMSE: 20/30, CDT: 3/4, AFT: 12/min. On 07/21/2015: MMSE: 20/30, CDT: 4/4, AFT: 9/min. On 01/19/2016: MMSE: 21/30, CDT: 3/4, AFT: 9/min.   Cranial nerves are as  described above under HEENT exam. In addition, shoulder shrug is normal with equal shoulder height noted.  Motor exam: Normal bulk, and strength for age is noted. Tone is not rigid with absence of cogwheeling in the extremities. There is overall no significant bradykinesia. There is no drift or rebound. There is a mild postural and action tremor in both upper extremities. There is no significant resting tremor. Romberg is negative. Reflexes are 1+ in the upper extremities and 1+ in the lower extremities. Fine motor  skills are mildly impaired bilaterally.   There is no truncal or gait ataxia.   Sensory exam is intact to light touch, but decreased mildly to vibration, PP and temperature sense in both feet.   Gait, station and balance: He stands up from the seated position with mild difficulty and posture is mildly stooped. Stance is narrow-based. He turns in 3 steps. Balance is fairly well preserved. He uses his 2 wheeled walker.   Assessment and Plan:   In summary, Jose [name] is a very pleasant 82 year old male with an underlying medical history of chronic back pain, arthritis, depression, reflux disease, and hypertension who Presents for follow-up consultation of his long-standing history of dementia without behavioral disturbance. He was diagnosed 17 years ago or so. He has had no problems tolerating his medications, is on dual medication with Namenda XR 28 mg daily and generic Aricept 23 mg daily. He did not need refills today. He is encouraged to add more exercise to his daily routine and he and his wife are encouraged [facility] In addition, they also can talk to the physical therapist there on site. Wife is advised to monitor his leg swelling. She reports chronic swelling that does fluctuate from time to time, seems to be worse on the left side currently. On the positive side, sleep has improved, he tends to sleep better at night and less during the day.  Physical exam and  memory scores are stable. He is to go to [employer] with his son and work with a Clinical research associate, but I encouraged that they go to the exercise room at the retirement community for now. Both his daughter and his son live close by and help out, thankfully. They have grandchildren in [place] and [place]. Thankfully he has a very caring and involved family and a good support system overall and dedicated spouse! He has been able to tolerate both medications at the current doses. I would like to see him back in 6  months, sooner if the need arises. I encouraged him to call with any interim questions, concerns, problems, updates, or refill requests.  I spent 25 minutes in total face-to-face time with the patient, more than 50% of which was spent in counseling and coordination of care, reviewing test results, reviewing medication and discussing or reviewing the diagnosis of dementia, the prognosis and treatment options.

## 2016-03-06 ENCOUNTER — Encounter: Payer: Self-pay | Admitting: Sports Medicine

## 2016-03-06 ENCOUNTER — Ambulatory Visit (INDEPENDENT_AMBULATORY_CARE_PROVIDER_SITE_OTHER): Payer: Medicare Other | Admitting: Sports Medicine

## 2016-03-06 DIAGNOSIS — B351 Tinea unguium: Secondary | ICD-10-CM

## 2016-03-06 DIAGNOSIS — M79676 Pain in unspecified toe(s): Secondary | ICD-10-CM

## 2016-03-06 DIAGNOSIS — I739 Peripheral vascular disease, unspecified: Secondary | ICD-10-CM

## 2016-03-06 NOTE — Progress Notes (Signed)
Patient ID: SERVANDO BURGHART, male   DOB: [birth year], 82 y.o.   MRN: [id number]  Subjective: DOY KOPKA is a 82 y.o. male patient seen today in office with complaint of painful thickened and elongated toenails; unable to trim. Patient denies changes with medical history since last visit. Patient reports he now has to use a walker because of age and worsening back problems. Patient has no other pedal complaints at this time.   Patient Active Problem List   Diagnosis Date Noted  . Dementia 11/12/2012  . History of bilateral hip replacements 11/12/2012    Current Outpatient Prescriptions on File Prior to Visit  Medication Sig Dispense Refill  . Ascorbic Acid (VITAMIN C) 1000 MG tablet Take 1,000 mg by mouth daily.      Marland Kitchen buPROPion (WELLBUTRIN SR) 150 MG 12 hr tablet Take 150 mg by mouth daily.   0  . Cholecalciferol (VITAMIN D) 1000 UNITS capsule Take 1,000 Units by mouth daily.      Marland Kitchen donepezil (ARICEPT) 23 MG TABS tablet Take 1 tablet (23 mg total) by mouth daily. 90 tablet 3  . dorzolamide-timolol (COSOPT) 22.3-6.8 MG/ML ophthalmic solution Place 1 drop into the left eye 2 (two) times daily.     . Flaxseed, Linseed, (FLAXSEED OIL) 1000 MG CAPS Take 1,000 mg by mouth daily.      . folic acid (FOLVITE) A999333 MCG tablet Take 800 mcg by mouth every evening.     . hydrocortisone (ANUSOL-HC) 25 MG suppository Place 1 suppository (25 mg total) rectally at bedtime. 30 suppository 1  . memantine (NAMENDA XR) 28 MG CP24 24 hr capsule Take 1 capsule (28 mg total) by mouth daily. 90 capsule 3  . metFORMIN (GLUCOPHAGE) 500 MG tablet Take 500 mg by mouth daily with breakfast.     . mirabegron ER (MYRBETRIQ) 50 MG TB24 tablet Take 50 mg by mouth daily.    . Misc Natural Products (TURMERIC CURCUMIN) CAPS Take 1 capsule by mouth daily.    . Multiple Vitamin (MULTIVITAMIN) tablet Take 1 tablet by mouth daily.      . potassium chloride (KLOR-CON) 10 MEQ CR tablet Take 10 mEq by mouth 2 (two) times a week.      .  Probiotic Product (ALIGN) 4 MG CAPS Take 4 mg by mouth daily.    . simvastatin (ZOCOR) 40 MG tablet Take 40 mg by mouth every evening.      No current facility-administered medications on file prior to visit.     No Known Allergies  Objective: Physical Exam  General: Well developed, nourished, no acute distress, awake, alert and oriented x 3  Vascular: Dorsalis pedis artery 0/4 bilateral, Posterior tibial artery 0/4 bilateral, skin temperature warm to cool proximal to distal bilateral lower extremities, no varicosities, significant trophic skin changes from chronic 2+ pitting edema bilateral, no pedal hair present bilateral.  Neurological: Gross sensation present via light touch bilateral.   Dermatological: Skin is dry, and supple bilateral, Nails 1-10 are tender, long, thick, and discolored with moderate subungal debris, no webspace macerations present bilateral, no open lesions present bilateral, no callus/corns/hyperkeratotic tissue present bilateral. No signs of infection bilateral.  Musculoskeletal: No boney deformities noted bilateral. Muscular strength within normal limits without pain on range of motion. No pain with calf compression bilateral.  Assessment and Plan:  Problem List Items Addressed This Visit    None    Visit Diagnoses    Pain due to onychomycosis of toenail    -  Primary   PVD (peripheral vascular disease) ([place])         -Examined patient.  -Discussed treatment options for painful mycotic nails. -Mechanically debrided and reduced mycotic nails with sterile nail nipper and dremel nail file without incident. -Recommend continue with lower leg elevation to assist with edema control and discuss with PCP edema pumps or diuretics at next visit -Patient to return in 3 months for follow up evaluation or sooner if symptoms worsen.  Landis Martins, DPM

## 2016-03-16 ENCOUNTER — Encounter (HOSPITAL_COMMUNITY): Payer: Self-pay | Admitting: Emergency Medicine

## 2016-03-16 ENCOUNTER — Emergency Department (HOSPITAL_COMMUNITY)
Admission: EM | Admit: 2016-03-16 | Discharge: 2016-03-16 | Disposition: A | Discharge status: Home / Self Care | Payer: Medicare Other | Attending: Emergency Medicine | Admitting: Emergency Medicine

## 2016-03-16 DIAGNOSIS — Z79899 Other long term (current) drug therapy: Secondary | ICD-10-CM | POA: Diagnosis not present

## 2016-03-16 DIAGNOSIS — Z96643 Presence of artificial hip joint, bilateral: Secondary | ICD-10-CM | POA: Insufficient documentation

## 2016-03-16 DIAGNOSIS — R197 Diarrhea, unspecified: Secondary | ICD-10-CM | POA: Insufficient documentation

## 2016-03-16 DIAGNOSIS — Z85828 Personal history of other malignant neoplasm of skin: Secondary | ICD-10-CM | POA: Diagnosis not present

## 2016-03-16 DIAGNOSIS — Z7984 Long term (current) use of oral hypoglycemic drugs: Secondary | ICD-10-CM | POA: Insufficient documentation

## 2016-03-16 DIAGNOSIS — Z87891 Personal history of nicotine dependence: Secondary | ICD-10-CM | POA: Insufficient documentation

## 2016-03-16 LAB — LIPASE, BLOOD: Lipase: 25 U/L (ref 11–51)

## 2016-03-16 LAB — CBC
HEMATOCRIT: 44.2 % (ref 39.0–52.0)
HEMOGLOBIN: 15.2 g/dL (ref 13.0–17.0)
MCH: 33.6 pg (ref 26.0–34.0)
MCHC: 34.4 g/dL (ref 30.0–36.0)
MCV: 97.6 fL (ref 78.0–100.0)
Platelets: 124 10*3/uL — ABNORMAL LOW (ref 150–400)
RBC: 4.53 MIL/uL (ref 4.22–5.81)
RDW: 13.4 % (ref 11.5–15.5)
WBC: 13.2 10*3/uL — AB (ref 4.0–10.5)

## 2016-03-16 LAB — COMPREHENSIVE METABOLIC PANEL
ALT: 27 U/L (ref 17–63)
ANION GAP: 8 (ref 5–15)
AST: 22 U/L (ref 15–41)
Albumin: 4 g/dL (ref 3.5–5.0)
Alkaline Phosphatase: 77 U/L (ref 38–126)
BUN: 27 mg/dL — AB (ref 6–20)
CHLORIDE: 106 mmol/L (ref 101–111)
CO2: 25 mmol/L (ref 22–32)
Calcium: 9.2 mg/dL (ref 8.9–10.3)
Creatinine, Ser: 1 mg/dL (ref 0.61–1.24)
Glucose, Bld: 164 mg/dL — ABNORMAL HIGH (ref 65–99)
POTASSIUM: 4 mmol/L (ref 3.5–5.1)
Sodium: 139 mmol/L (ref 135–145)
Total Bilirubin: 0.9 mg/dL (ref 0.3–1.2)
Total Protein: 7 g/dL (ref 6.5–8.1)

## 2016-03-16 MED ORDER — SODIUM CHLORIDE 0.9 % IV SOLN
1000.0000 mL | INTRAVENOUS | Status: DC
Start: 2016-03-16 — End: 2016-03-16
  Administered 2016-03-16: 1000 mL via INTRAVENOUS

## 2016-03-16 MED ORDER — SODIUM CHLORIDE 0.9 % IV SOLN
1000.0000 mL | Freq: Once | INTRAVENOUS | Status: AC
  Administered 2016-03-16: 1000 mL via INTRAVENOUS

## 2016-03-16 NOTE — ED Provider Notes (Signed)
[facility] DEPT Provider Note   CSN: [id number] Arrival date & time: 03/16/16  1137     History   Chief Complaint Chief Complaint  Patient presents with  . Diarrhea  . Abdominal Pain   Level V caveat: Dementia  HPI Jose [name] is a 82 y.o. male.  HPI Patient presents to the emergency department with his wife and his daughter with reported diarrhea since last evening.  He's been given 3 doses of Imodium through the night and continues to have diarrhea.  He reported mild generalized weakness to his family.  At one point he reported some abdominal pain but has not reported this recently to his family.  Family reports no fevers.  No history of sick contacts.  No else in the house with diarrhea.  No reports of vomiting.  Wife reports the patient does not like to drink fluids to begin with and she is concerned that he may be dehydrated   Past Medical History:  Diagnosis Date  . Abnormality of gait   . Backache, unspecified   . Colon polyps    adenomatous  . Dementia 11/12/2012  . Diverticulosis   . Essential and other specified forms of tremor   . Hemorrhoids   . History of bilateral hip replacements 11/12/2012  . Memory loss   . 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 Pain in joint, pelvic region and thigh   . Skin cancer of scalp   . Spinal stenosis, lumbar region, without neurogenic claudication   . Unspecified hereditary and idiopathic peripheral neuropathy     Patient Active Problem List   Diagnosis Date Noted  . Dementia 11/12/2012  . History of bilateral hip replacements 11/12/2012    Past Surgical History:  Procedure Laterality Date  . CATARACT EXTRACTION, BILATERAL  2012  . RETINAL LASER PROCEDURE  2012   retinal wrinkle  . SKIN CANCER EXCISION    . TOTAL HIP ARTHROPLASTY Left 2000  . TOTAL HIP ARTHROPLASTY (aka REPLACEMENT) Right 2011       Home Medications    Prior to Admission medications   Medication Sig  Start Date End Date Taking? Authorizing Provider  Ascorbic Acid (VITAMIN C) 1000 MG tablet Take 1,000 mg by mouth daily.     Yes Historical Provider, MD  buPROPion (WELLBUTRIN SR) 150 MG 12 hr tablet Take 150 mg by mouth daily.  01/27/14  Yes Historical Provider, MD  Cholecalciferol (VITAMIN D) 1000 UNITS capsule Take 1,000 Units by mouth daily.     Yes Historical Provider, MD  dextromethorphan (DELSYM) 30 MG/5ML liquid Take 30 mg by mouth at bedtime as needed for cough.   Yes Historical Provider, MD  donepezil (ARICEPT) 23 MG TABS tablet Take 1 tablet (23 mg total) by mouth daily. 10/19/15  Yes Star Age, MD  dorzolamide-timolol (COSOPT) 22.3-6.8 MG/ML ophthalmic solution Place 1 drop into the left eye 2 (two) times daily.  09/20/12  Yes Historical Provider, MD  Flaxseed, Linseed, (FLAXSEED OIL) 1000 MG CAPS Take 1,000 mg by mouth daily.     Yes Historical Provider, MD  folic acid (FOLVITE) A999333 MCG tablet Take 800 mcg by mouth every evening.    Yes Historical Provider, MD  hydrocortisone (ANUSOL-HC) 25 MG suppository Place 1 suppository (25 mg total) rectally at bedtime. Patient taking differently: Place 25 mg rectally at bedtime as needed for hemorrhoids or itching.  02/23/14  Yes Irene Shipper, MD  loperamide (IMODIUM A-D) 2 MG tablet Take 2-4  mg by mouth 4 (four) times daily as needed for diarrhea or loose stools.   Yes Historical Provider, MD  memantine (NAMENDA XR) 28 MG CP24 24 hr capsule Take 1 capsule (28 mg total) by mouth daily. 09/27/15  Yes Star Age, MD  metFORMIN (GLUCOPHAGE) 500 MG tablet Take 500 mg by mouth daily with breakfast.  10/25/12  Yes Historical Provider, MD  mirabegron ER (MYRBETRIQ) 50 MG TB24 tablet Take 50 mg by mouth daily.   Yes Historical Provider, MD  Misc Natural Products (TURMERIC CURCUMIN) CAPS Take 1 capsule by mouth daily.   Yes Historical Provider, MD  Multiple Vitamin (MULTIVITAMIN) tablet Take 1 tablet by mouth daily.     Yes Historical Provider, MD    potassium chloride (KLOR-CON) 10 MEQ CR tablet Take 10 mEq by mouth 2 (two) times a week.     Yes Historical Provider, MD  Probiotic Product (ALIGN) 4 MG CAPS Take 4 mg by mouth daily.   Yes Historical Provider, MD  simvastatin (ZOCOR) 40 MG tablet Take 40 mg by mouth every evening.    Yes Historical Provider, MD    Family History Family History  Problem Relation Age of Onset  . Heart failure Mother     Social History Social History  Substance Use Topics  . Smoking status: Former Smoker    Types: Cigarettes  . Smokeless tobacco: Never Used  . Alcohol use 8.4 oz/week    14 Glasses of wine per week     Comment: occas, one glass of wine before dinner,sometimes 1/2 glass     Allergies   Patient has no known allergies.   Review of Systems Review of Systems  Unable to perform ROS: Dementia     Physical Exam Updated Vital Signs BP 143/82   Pulse 67   Temp 98 F (36.7 C) (Oral)   Resp 16   Ht 5\' 10"  (1.778 m)   Wt 85 lb (38.6 kg)   SpO2 99%   BMI 12.20 kg/m   Physical Exam  Constitutional: He appears well-developed and well-nourished.  HENT:  Head: Normocephalic and atraumatic.  Eyes: EOM are normal.  Neck: Normal range of motion.  Cardiovascular: Normal rate, regular rhythm, normal heart sounds and intact distal pulses.   Pulmonary/Chest: Effort normal and breath sounds normal. No respiratory distress.  Abdominal: Soft. He exhibits no distension. There is no tenderness.  Musculoskeletal: Normal range of motion.  Neurological: He is alert.  Skin: Skin is warm and dry.  Psychiatric: He has a normal mood and affect. Judgment normal.  Nursing note and vitals reviewed.    ED Treatments / Results  Labs (all labs ordered are listed, but only abnormal results are displayed) Labs Reviewed  COMPREHENSIVE METABOLIC PANEL - Abnormal; Notable for the following:       Result Value   Glucose, Bld 164 (*)    BUN 27 (*)    All other components within normal limits   CBC - Abnormal; Notable for the following:    WBC 13.2 (*)    Platelets 124 (*)    All other components within normal limits  LIPASE, BLOOD    EKG  EKG Interpretation None       Radiology No results found.  Procedures Procedures (including critical care time)  Medications Ordered in ED Medications  0.9 %  sodium chloride infusion (0 mLs Intravenous Stopped 03/16/16 1256)    Followed by  0.9 %  sodium chloride infusion (1,000 mLs Intravenous New Bag/Given 03/16/16 1257)  Initial Impression / Assessment and Plan / ED Course  I have reviewed the triage vital signs and the nursing notes.  Pertinent labs & imaging results that were available during my care of the patient were reviewed by me and considered in my medical decision making (see chart for details).  Clinical Course     2:11 PM Patient feels better after IV fluids.  Labs without significant abnormality.  Discharge home in good condition.  Recommended ongoing oral hydration at home.  Isolated diarrhea.  Repeat abdominal exam without tenderness  Final Clinical Impressions(s) / ED Diagnoses   Final diagnoses:  Diarrhea, unspecified type    New Prescriptions New Prescriptions   No medications on file     Jola Schmidt, MD 03/16/16 1411

## 2016-03-16 NOTE — ED Notes (Signed)
Urinal at bedisde

## 2016-03-16 NOTE — ED Triage Notes (Addendum)
Pt reports diarrhea since 8pm last night. Family gave pt 3 dose of immodium throughout the night. Last dose of immodium at 0800 relieved diarrhea. No emesis. Pt complained of central abd pain to family this am and generalized weakness as well. PCP told pt come here to evaluate for dehydration.

## 2016-06-12 ENCOUNTER — Ambulatory Visit: Payer: Medicare Other | Admitting: Sports Medicine

## 2016-06-22 ENCOUNTER — Encounter (HOSPITAL_COMMUNITY): Payer: Self-pay | Admitting: Nurse Practitioner

## 2016-06-22 ENCOUNTER — Emergency Department (HOSPITAL_COMMUNITY): Payer: Medicare Other

## 2016-06-22 ENCOUNTER — Observation Stay (HOSPITAL_COMMUNITY)
Admission: EM | Admit: 2016-06-22 | Discharge: 2016-06-23 | Disposition: A | Discharge status: Skilled Nursing Facility | Payer: Medicare Other | Attending: Internal Medicine | Admitting: Internal Medicine

## 2016-06-22 DIAGNOSIS — Z888 Allergy status to other drugs, medicaments and biological substances status: Secondary | ICD-10-CM | POA: Diagnosis not present

## 2016-06-22 DIAGNOSIS — Z7984 Long term (current) use of oral hypoglycemic drugs: Secondary | ICD-10-CM | POA: Diagnosis not present

## 2016-06-22 DIAGNOSIS — M8588 Other specified disorders of bone density and structure, other site: Secondary | ICD-10-CM | POA: Insufficient documentation

## 2016-06-22 DIAGNOSIS — Z9841 Cataract extraction status, right eye: Secondary | ICD-10-CM | POA: Insufficient documentation

## 2016-06-22 DIAGNOSIS — Z96643 Presence of artificial hip joint, bilateral: Secondary | ICD-10-CM

## 2016-06-22 DIAGNOSIS — Z85828 Personal history of other malignant neoplasm of skin: Secondary | ICD-10-CM | POA: Diagnosis not present

## 2016-06-22 DIAGNOSIS — W1830XA Fall on same level, unspecified, initial encounter: Secondary | ICD-10-CM | POA: Diagnosis not present

## 2016-06-22 DIAGNOSIS — S72002A Fracture of unspecified part of neck of left femur, initial encounter for closed fracture: Secondary | ICD-10-CM

## 2016-06-22 DIAGNOSIS — Z87891 Personal history of nicotine dependence: Secondary | ICD-10-CM | POA: Insufficient documentation

## 2016-06-22 DIAGNOSIS — S72115A Nondisplaced fracture of greater trochanter of left femur, initial encounter for closed fracture: Secondary | ICD-10-CM | POA: Diagnosis not present

## 2016-06-22 DIAGNOSIS — Z9842 Cataract extraction status, left eye: Secondary | ICD-10-CM | POA: Diagnosis not present

## 2016-06-22 DIAGNOSIS — F419 Anxiety disorder, unspecified: Secondary | ICD-10-CM | POA: Insufficient documentation

## 2016-06-22 DIAGNOSIS — F0280 Dementia in other diseases classified elsewhere without behavioral disturbance: Secondary | ICD-10-CM | POA: Diagnosis not present

## 2016-06-22 DIAGNOSIS — F0390 Unspecified dementia without behavioral disturbance: Secondary | ICD-10-CM | POA: Diagnosis not present

## 2016-06-22 DIAGNOSIS — E1122 Type 2 diabetes mellitus with diabetic chronic kidney disease: Secondary | ICD-10-CM

## 2016-06-22 DIAGNOSIS — E785 Hyperlipidemia, unspecified: Secondary | ICD-10-CM

## 2016-06-22 DIAGNOSIS — Z79899 Other long term (current) drug therapy: Secondary | ICD-10-CM | POA: Diagnosis not present

## 2016-06-22 DIAGNOSIS — F329 Major depressive disorder, single episode, unspecified: Secondary | ICD-10-CM | POA: Diagnosis not present

## 2016-06-22 DIAGNOSIS — E1142 Type 2 diabetes mellitus with diabetic polyneuropathy: Secondary | ICD-10-CM | POA: Diagnosis not present

## 2016-06-22 DIAGNOSIS — D696 Thrombocytopenia, unspecified: Secondary | ICD-10-CM | POA: Insufficient documentation

## 2016-06-22 DIAGNOSIS — Z86010 Personal history of colonic polyps: Secondary | ICD-10-CM | POA: Diagnosis not present

## 2016-06-22 DIAGNOSIS — D72829 Elevated white blood cell count, unspecified: Secondary | ICD-10-CM

## 2016-06-22 DIAGNOSIS — G309 Alzheimer's disease, unspecified: Secondary | ICD-10-CM | POA: Diagnosis not present

## 2016-06-22 DIAGNOSIS — E118 Type 2 diabetes mellitus with unspecified complications: Secondary | ICD-10-CM | POA: Diagnosis not present

## 2016-06-22 DIAGNOSIS — Z8249 Family history of ischemic heart disease and other diseases of the circulatory system: Secondary | ICD-10-CM | POA: Diagnosis not present

## 2016-06-22 DIAGNOSIS — S72002S Fracture of unspecified part of neck of left femur, sequela: Secondary | ICD-10-CM | POA: Diagnosis present

## 2016-06-22 DIAGNOSIS — M48061 Spinal stenosis, lumbar region without neurogenic claudication: Secondary | ICD-10-CM | POA: Diagnosis not present

## 2016-06-22 LAB — BASIC METABOLIC PANEL
ANION GAP: 7 (ref 5–15)
BUN: 17 mg/dL (ref 6–20)
CHLORIDE: 105 mmol/L (ref 101–111)
CO2: 26 mmol/L (ref 22–32)
Calcium: 9.3 mg/dL (ref 8.9–10.3)
Creatinine, Ser: 0.98 mg/dL (ref 0.61–1.24)
GFR calc Af Amer: >60 mL/min (ref >60)
Glucose, Bld: 147 mg/dL — ABNORMAL HIGH (ref 65–99)
POTASSIUM: 3.9 mmol/L (ref 3.5–5.1)
SODIUM: 138 mmol/L (ref 135–145)

## 2016-06-22 LAB — CBC WITH DIFFERENTIAL/PLATELET
BASOS ABS: 0 10*3/uL (ref 0.0–0.1)
Basophils Relative: 0 %
EOS ABS: 0.1 10*3/uL (ref 0.0–0.7)
EOS PCT: 0 %
HCT: 43.9 % (ref 39.0–52.0)
HEMOGLOBIN: 15 g/dL (ref 13.0–17.0)
LYMPHS ABS: 1.5 10*3/uL (ref 0.7–4.0)
LYMPHS PCT: 8 %
MCH: 33.1 pg (ref 26.0–34.0)
MCHC: 34.2 g/dL (ref 30.0–36.0)
MCV: 96.9 fL (ref 78.0–100.0)
Monocytes Absolute: 1.1 10*3/uL — ABNORMAL HIGH (ref 0.1–1.0)
Monocytes Relative: 6 %
NEUTROS PCT: 86 %
Neutro Abs: 16 10*3/uL — ABNORMAL HIGH (ref 1.7–7.7)
Platelets: 130 10*3/uL — ABNORMAL LOW (ref 150–400)
RBC: 4.53 MIL/uL (ref 4.22–5.81)
RDW: 13.1 % (ref 11.5–15.5)
WBC: 18.7 10*3/uL — ABNORMAL HIGH (ref 4.0–10.5)

## 2016-06-22 LAB — GLUCOSE, CAPILLARY
Glucose-Capillary: 209 mg/dL — ABNORMAL HIGH (ref 65–99)
Glucose-Capillary: 132 mg/dL — ABNORMAL HIGH (ref 65–99)

## 2016-06-22 MED ORDER — TRAMADOL HCL 50 MG PO TABS
25.0000 mg | ORAL_TABLET | Freq: Four times a day (QID) | ORAL | Status: DC | PRN
  Administered 2016-06-22 – 2016-06-23 (×3): 50 mg via ORAL
  Filled 2016-06-22 (×3): qty 1

## 2016-06-22 MED ORDER — ALIGN 4 MG PO CAPS: 4.0000 mg | ORAL_CAPSULE | Freq: Every day | ORAL | Status: DC

## 2016-06-22 MED ORDER — FOLIC ACID 1 MG PO TABS
1.0000 mg | ORAL_TABLET | Freq: Every day | ORAL | Status: DC
  Administered 2016-06-23: 1 mg via ORAL
  Filled 2016-06-22: qty 1

## 2016-06-22 MED ORDER — DONEPEZIL HCL 23 MG PO TABS
23.0000 mg | ORAL_TABLET | Freq: Every day | ORAL | Status: DC
  Administered 2016-06-22 – 2016-06-23 (×2): 23 mg via ORAL
  Filled 2016-06-22 (×2): qty 1

## 2016-06-22 MED ORDER — LOPERAMIDE HCL 2 MG PO TABS: 2.0000 mg | ORAL_TABLET | Freq: Four times a day (QID) | ORAL | Status: DC | PRN

## 2016-06-22 MED ORDER — INSULIN ASPART 100 UNIT/ML ~~LOC~~ SOLN
0.0000 [IU] | Freq: Three times a day (TID) | SUBCUTANEOUS | Status: DC
  Administered 2016-06-22: 3 [IU] via SUBCUTANEOUS
  Administered 2016-06-23: 09:00:00 1 [IU] via SUBCUTANEOUS

## 2016-06-22 MED ORDER — ENOXAPARIN SODIUM 40 MG/0.4ML ~~LOC~~ SOLN: 40.0000 mg | SUBCUTANEOUS | Status: DC

## 2016-06-22 MED ORDER — ACETAMINOPHEN 325 MG PO TABS: 650.0000 mg | ORAL_TABLET | Freq: Four times a day (QID) | ORAL | Status: DC | PRN

## 2016-06-22 MED ORDER — ADULT MULTIVITAMIN W/MINERALS CH
1.0000 | ORAL_TABLET | Freq: Every day | ORAL | Status: DC
  Administered 2016-06-23: 1 via ORAL
  Filled 2016-06-22: qty 1

## 2016-06-22 MED ORDER — DEXTROMETHORPHAN POLISTIREX ER 30 MG/5ML PO SUER
30.0000 mg | Freq: Every evening | ORAL | Status: DC | PRN
  Filled 2016-06-22: qty 5

## 2016-06-22 MED ORDER — MIRABEGRON ER 50 MG PO TB24
50.0000 mg | ORAL_TABLET | Freq: Every day | ORAL | Status: DC
Start: 2016-06-22 — End: 2016-06-23
  Administered 2016-06-22 – 2016-06-23 (×2): 50 mg via ORAL
  Filled 2016-06-22 (×2): qty 1

## 2016-06-22 MED ORDER — DORZOLAMIDE HCL-TIMOLOL MAL 2-0.5 % OP SOLN
1.0000 [drp] | Freq: Two times a day (BID) | OPHTHALMIC | Status: DC
  Administered 2016-06-22 – 2016-06-23 (×2): 1 [drp] via OPHTHALMIC
  Filled 2016-06-22: qty 10

## 2016-06-22 MED ORDER — FOLIC ACID 400 MCG PO TABS: 800.0000 ug | ORAL_TABLET | Freq: Every evening | ORAL | Status: DC

## 2016-06-22 MED ORDER — MEMANTINE HCL ER 28 MG PO CP24
28.0000 mg | ORAL_CAPSULE | Freq: Every day | ORAL | Status: DC
  Administered 2016-06-22 – 2016-06-23 (×2): 28 mg via ORAL
  Filled 2016-06-22 (×2): qty 1

## 2016-06-22 MED ORDER — FLAXSEED OIL 1000 MG PO CAPS: 1000.0000 mg | ORAL_CAPSULE | Freq: Every day | ORAL | Status: DC

## 2016-06-22 MED ORDER — VITAMIN D 1000 UNITS PO TABS
1000.0000 [IU] | ORAL_TABLET | Freq: Every day | ORAL | Status: DC
  Administered 2016-06-22 – 2016-06-23 (×2): 1000 [IU] via ORAL
  Filled 2016-06-22 (×2): qty 1

## 2016-06-22 MED ORDER — LOPERAMIDE HCL 2 MG PO CAPS: 2.0000 mg | ORAL_CAPSULE | Freq: Four times a day (QID) | ORAL | Status: DC | PRN

## 2016-06-22 MED ORDER — BUPROPION HCL ER (SR) 150 MG PO TB12
150.0000 mg | ORAL_TABLET | Freq: Every day | ORAL | Status: DC
  Administered 2016-06-22 – 2016-06-23 (×2): 150 mg via ORAL
  Filled 2016-06-22 (×2): qty 1

## 2016-06-22 MED ORDER — ACETAMINOPHEN 650 MG RE SUPP: 650.0000 mg | Freq: Four times a day (QID) | RECTAL | Status: DC | PRN

## 2016-06-22 MED ORDER — ENOXAPARIN SODIUM 40 MG/0.4ML ~~LOC~~ SOLN
40.0000 mg | SUBCUTANEOUS | Status: DC
  Administered 2016-06-22: 40 mg via SUBCUTANEOUS
  Filled 2016-06-22: qty 0.4

## 2016-06-22 MED ORDER — METFORMIN HCL 500 MG PO TABS
500.0000 mg | ORAL_TABLET | Freq: Every day | ORAL | Status: DC
  Administered 2016-06-23: 500 mg via ORAL
  Filled 2016-06-22: qty 1

## 2016-06-22 MED ORDER — VITAMIN C 250 MG PO TABS
1000.0000 mg | ORAL_TABLET | Freq: Every day | ORAL | Status: DC
  Administered 2016-06-23: 11:00:00 1000 mg via ORAL
  Filled 2016-06-22: qty 4

## 2016-06-22 MED ORDER — RISAQUAD PO CAPS: 1.0000 | ORAL_CAPSULE | Freq: Every day | ORAL | Status: DC

## 2016-06-22 MED ORDER — SIMVASTATIN 20 MG PO TABS
40.0000 mg | ORAL_TABLET | Freq: Every evening | ORAL | Status: DC
  Administered 2016-06-22: 18:00:00 40 mg via ORAL
  Filled 2016-06-22: qty 2

## 2016-06-22 NOTE — Progress Notes (Signed)
Admitting MD paged. Pt c/o right hip pain per family. New orders received from MD.

## 2016-06-22 NOTE — H&P (Signed)
Patient ID: JIONNI HELMING MRN: [id number] DOB/AGE: Sep 05, 1930 83 y.o.  Admit date: 06/22/2016  Admission Diagnoses:  Non displaced left Greater Trochanter fracture  HPI: Pleasant 83 year old male pt.  He has a hx of b/l hip replacements done by Dr. Alvan Dame.  Pt reports arising from bed this am to go to the restroom.  He reports missing his walker and falling on the floor.  He wife came into talk to him a few minutes later and noted he was on the floor.  [facility]  He needed assistance to get up off the floor and reported sever left hip pain.  He was then transported to [facility].   Past Medical History: Past Medical History:  Diagnosis Date  . Abnormality of gait   . Backache, unspecified   . Colon polyps    adenomatous  . Dementia 11/12/2012  . Diverticulosis   . Essential and other specified forms of tremor   . Hemorrhoids   . History of bilateral hip replacements 11/12/2012  . Memory loss   . 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 Pain in joint, pelvic region and thigh   . Skin cancer of scalp   . Spinal stenosis, lumbar region, without neurogenic claudication   . Unspecified hereditary and idiopathic peripheral neuropathy     Surgical History: Past Surgical History:  Procedure Laterality Date  . CATARACT EXTRACTION, BILATERAL  2012  . RETINAL LASER PROCEDURE  2012   retinal wrinkle  . SKIN CANCER EXCISION    . TOTAL HIP ARTHROPLASTY Left 2000  . TOTAL HIP ARTHROPLASTY (aka REPLACEMENT) Right 2011    Family History: Family History  Problem Relation Age of Onset  . Heart failure Mother     Social History: Social History   Social History  . Marital status: Married    Spouse name: [name]  . Number of children: 2  . Years of education: lawyer   Occupational History  . Retired [employer]    retired   Social History Main Topics  . Smoking status: Former Smoker    Types: Cigarettes  . Smokeless tobacco:  Never Used  . Alcohol use 8.4 oz/week    14 Glasses of wine per week     Comment: occas, one glass of wine before dinner,sometimes 1/2 glass  . Drug use: No  . Sexual activity: Not on file   Other Topics Concern  . Not on file   Social History Narrative   Patient is right handed and resides in home with wife    Allergies: Axona [flora-q] and Gabapentin  Medications: I have reviewed the patient's current medications.  Vital Signs: Patient Vitals for the past 24 hrs:  BP Temp Temp src Pulse Resp SpO2  06/22/16 1117 129/86 97.6 F (36.4 C) Oral 97 18 96 %    Radiology: Dg Chest 2 View  Result Date: 06/22/2016 CLINICAL DATA:  Pain following fall EXAM: CHEST  2 VIEW COMPARISON:  None. FINDINGS: There is no edema or consolidation. Heart size and pulmonary vascularity are normal. No adenopathy. There is degenerative change in the thoracic spine. IMPRESSION: No edema or consolidation. Electronically Signed   By: Lowella Grip III M.D.   On: 06/22/2016 11:50   Dg Cervical Spine Complete  Result Date: 06/22/2016 CLINICAL DATA:  Fall. EXAM: CERVICAL SPINE - COMPLETE 4+ VIEW COMPARISON:  PET-CT 06/18/2012.  MRI 06/01/2012 . FINDINGS: Diffuse osteopenia and multilevel severe degenerative change.  3 mm anterolisthesis C3-C4. 5 mm anterolisthesis C4-C5. No evidence of fracture or dislocation. Pulmonary apices are clear. IMPRESSION: Diffuse osteopenia and multilevel degenerative change. 3 mm anterolisthesis C3-C4. 5 mm anterolisthesis C4-C5. No evidence of fracture or dislocation. Electronically Signed   By: Marcello Moores  Register   On: 06/22/2016 10:31   Dg Hip Unilat W Or Wo Pelvis 2-3 Views Left  Result Date: 06/22/2016 CLINICAL DATA:  Status post fall, left hip pain. EXAM: DG HIP (WITH OR WITHOUT PELVIS) 2-3V LEFT COMPARISON:  None. FINDINGS: Bilateral total hip arthroplasties. Nondisplaced fracture isolated to the left greater trochanter extending to the femoral stem component of the  arthroplasty. No other fracture or dislocation.  Generalized osteopenia. IMPRESSION: Nondisplaced fracture isolated to the left greater trochanter extending to the femoral stem component of the arthroplasty. Electronically Signed   By: Kathreen Devoid   On: 06/22/2016 10:25    Labs:  Recent Labs  06/22/16 1057  WBC 18.7*  RBC 4.53  HCT 43.9  PLT 130*    Recent Labs  06/22/16 1057  NA 138  K 3.9  CL 105  CO2 26  BUN 17  CREATININE 0.98  GLUCOSE 147*  CALCIUM 9.3   No results for input(s): LABPT, INR in the last 72 hours.  Review of Systems: ROS  Physical Exam: Neurologically intact ABD soft Sensation intact distally Dorsiflexion/Plantar flexion intact Compartment soft Pain elicited with movement of LLE TTP of the left Hip    Assessment and Plan: Dr Rolena Infante and Dr. Alvan Dame reviewed imaging No surgical intervention needed at this time Medicine will admit for pain control WBAT Pt will need PT/Rehab Signing off at this time F/U in clinic with Dr. Alvan Dame in 2 weeks   Ronette Deter, [facility] for [facility], [place] [phone]

## 2016-06-22 NOTE — ED Triage Notes (Signed)
Pt arrives to [facility] via [facility] after suffering a fall this morning. Patient was rising from bed and reached for his walker which his wife reported was d/t his walker being too far away. Pt fell to floor with point of impact being left hip. Pt has history of bilateral hip replacements and pts wife requests evaluation. Pt moving feet but does not lift left leg from bed. Pt also has hx of dementia. Wife to follow EMS.

## 2016-06-22 NOTE — ED Notes (Signed)
Pt's family to bedside.

## 2016-06-22 NOTE — ED Provider Notes (Signed)
[facility] DEPT Provider Note   CSN: [id number] Arrival date & time: 06/22/16  6283     History   Chief Complaint Chief Complaint  Patient presents with  . Fall  . Hip Pain    left    HPI Jose [name] is a 83 y.o. male.  Patient with history of bilateral hip replacements presents with complaint of left hip pain after a fall this morning. Patient was reportedly getting out of bed reaching for his walker but fell onto the ground. Level V caveat due to dementia. Patient was placed in a cervical spine collar prior to arrival.       Past Medical History:  Diagnosis Date  . Abnormality of gait   . Backache, unspecified   . Colon polyps    adenomatous  . Dementia 11/12/2012  . Diverticulosis   . Essential and other specified forms of tremor   . Hemorrhoids   . History of bilateral hip replacements 11/12/2012  . Memory loss   . 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 Pain in joint, pelvic region and thigh   . Skin cancer of scalp   . Spinal stenosis, lumbar region, without neurogenic claudication   . Unspecified hereditary and idiopathic peripheral neuropathy     Patient Active Problem List   Diagnosis Date Noted  . Dementia 11/12/2012  . History of bilateral hip replacements 11/12/2012    Past Surgical History:  Procedure Laterality Date  . CATARACT EXTRACTION, BILATERAL  2012  . RETINAL LASER PROCEDURE  2012   retinal wrinkle  . SKIN CANCER EXCISION    . TOTAL HIP ARTHROPLASTY Left 2000  . TOTAL HIP ARTHROPLASTY (aka REPLACEMENT) Right 2011       Home Medications    Prior to Admission medications   Medication Sig Start Date End Date Taking? Authorizing Provider  Ascorbic Acid (VITAMIN C) 1000 MG tablet Take 1,000 mg by mouth daily.      Historical Provider, MD  buPROPion (WELLBUTRIN SR) 150 MG 12 hr tablet Take 150 mg by mouth daily.  01/27/14   Historical Provider, MD  Cholecalciferol (VITAMIN D) 1000 UNITS  capsule Take 1,000 Units by mouth daily.      Historical Provider, MD  dextromethorphan (DELSYM) 30 MG/5ML liquid Take 30 mg by mouth at bedtime as needed for cough.    Historical Provider, MD  donepezil (ARICEPT) 23 MG TABS tablet Take 1 tablet (23 mg total) by mouth daily. 10/19/15   Star Age, MD  dorzolamide-timolol (COSOPT) 22.3-6.8 MG/ML ophthalmic solution Place 1 drop into the left eye 2 (two) times daily.  09/20/12   Historical Provider, MD  Flaxseed, Linseed, (FLAXSEED OIL) 1000 MG CAPS Take 1,000 mg by mouth daily.      Historical Provider, MD  folic acid (FOLVITE) 151 MCG tablet Take 800 mcg by mouth every evening.     Historical Provider, MD  hydrocortisone (ANUSOL-HC) 25 MG suppository Place 1 suppository (25 mg total) rectally at bedtime. Patient taking differently: Place 25 mg rectally at bedtime as needed for hemorrhoids or itching.  02/23/14   Irene Shipper, MD  loperamide (IMODIUM A-D) 2 MG tablet Take 2-4 mg by mouth 4 (four) times daily as needed for diarrhea or loose stools.    Historical Provider, MD  memantine (NAMENDA XR) 28 MG CP24 24 hr capsule Take 1 capsule (28 mg total) by mouth daily. 09/27/15   Star Age, MD  metFORMIN (GLUCOPHAGE) 500 MG tablet Take 500  mg by mouth daily with breakfast.  10/25/12   Historical Provider, MD  mirabegron ER (MYRBETRIQ) 50 MG TB24 tablet Take 50 mg by mouth daily.    Historical Provider, MD  Misc Natural Products (TURMERIC CURCUMIN) CAPS Take 1 capsule by mouth daily.    Historical Provider, MD  Multiple Vitamin (MULTIVITAMIN) tablet Take 1 tablet by mouth daily.      Historical Provider, MD  potassium chloride (KLOR-CON) 10 MEQ CR tablet Take 10 mEq by mouth 2 (two) times a week.      Historical Provider, MD  Probiotic Product (ALIGN) 4 MG CAPS Take 4 mg by mouth daily.    Historical Provider, MD  simvastatin (ZOCOR) 40 MG tablet Take 40 mg by mouth every evening.     Historical Provider, MD    Family History Family History  Problem  Relation Age of Onset  . Heart failure Mother     Social History Social History  Substance Use Topics  . Smoking status: Former Smoker    Types: Cigarettes  . Smokeless tobacco: Never Used  . Alcohol use 8.4 oz/week    14 Glasses of wine per week     Comment: occas, one glass of wine before dinner,sometimes 1/2 glass     Allergies   Axona [flora-q] and Gabapentin   Review of Systems Review of Systems  Unable to perform ROS: Dementia     Physical Exam Updated Vital Signs There were no vitals taken for this visit.  Physical Exam  Constitutional: He appears well-developed and well-nourished.  HENT:  Head: Normocephalic and atraumatic.  Mouth/Throat: Oropharynx is clear and moist.  Eyes: Conjunctivae are normal. Right eye exhibits no discharge. Left eye exhibits no discharge.  Neck: Normal range of motion. Neck supple.  Cardiovascular: Normal rate, regular rhythm and normal heart sounds.   Pulmonary/Chest: Effort normal and breath sounds normal. No respiratory distress. He has no wheezes. He has no rales.  Abdominal: Soft. There is no tenderness.  Musculoskeletal:       Right hip: Normal.       Left hip: He exhibits decreased range of motion, decreased strength and tenderness. He exhibits no bony tenderness and no deformity.       Right knee: Normal.       Left knee: Normal.       Right ankle: Normal.       Left ankle: Normal.       Lumbar back: Normal.       Right upper leg: Normal.       Left upper leg: Normal.  Neurological: He is alert.  Skin: Skin is warm and dry.  Psychiatric: He has a normal mood and affect.  Nursing note and vitals reviewed.    ED Treatments / Results  Labs (all labs ordered are listed, but only abnormal results are displayed) Labs Reviewed  CBC WITH DIFFERENTIAL/PLATELET - Abnormal; Notable for the following:       Result Value   WBC 18.7 (*)    Platelets 130 (*)    Neutro Abs 16.0 (*)    Monocytes Absolute 1.1 (*)    All other  components within normal limits  BASIC METABOLIC PANEL - Abnormal; Notable for the following:    Glucose, Bld 147 (*)    All other components within normal limits    EKG  EKG Interpretation None       Radiology Dg Chest 2 View  Result Date: 06/22/2016 CLINICAL DATA:  Pain following fall EXAM: CHEST  2 VIEW COMPARISON:  None. FINDINGS: There is no edema or consolidation. Heart size and pulmonary vascularity are normal. No adenopathy. There is degenerative change in the thoracic spine. IMPRESSION: No edema or consolidation. Electronically Signed   By: Lowella Grip III M.D.   On: 06/22/2016 11:50   Dg Cervical Spine Complete  Result Date: 06/22/2016 CLINICAL DATA:  Fall. EXAM: CERVICAL SPINE - COMPLETE 4+ VIEW COMPARISON:  PET-CT 06/18/2012.  MRI 06/01/2012 . FINDINGS: Diffuse osteopenia and multilevel severe degenerative change. 3 mm anterolisthesis C3-C4. 5 mm anterolisthesis C4-C5. No evidence of fracture or dislocation. Pulmonary apices are clear. IMPRESSION: Diffuse osteopenia and multilevel degenerative change. 3 mm anterolisthesis C3-C4. 5 mm anterolisthesis C4-C5. No evidence of fracture or dislocation. Electronically Signed   By: Marcello Moores  Register   On: 06/22/2016 10:31   Dg Hip Unilat W Or Wo Pelvis 2-3 Views Left  Result Date: 06/22/2016 CLINICAL DATA:  Status post fall, left hip pain. EXAM: DG HIP (WITH OR WITHOUT PELVIS) 2-3V LEFT COMPARISON:  None. FINDINGS: Bilateral total hip arthroplasties. Nondisplaced fracture isolated to the left greater trochanter extending to the femoral stem component of the arthroplasty. No other fracture or dislocation.  Generalized osteopenia. IMPRESSION: Nondisplaced fracture isolated to the left greater trochanter extending to the femoral stem component of the arthroplasty. Electronically Signed   By: Kathreen Devoid   On: 06/22/2016 10:25    Procedures Procedures (including critical care time)  Medications Ordered in ED Medications - No data  to display   Initial Impression / Assessment and Plan / ED Course  I have reviewed the triage vital signs and the nursing notes.  Pertinent labs & imaging results that were available during my care of the patient were reviewed by me and considered in my medical decision making (see chart for details).     Patient seen and examined. Work-up initiated. Medications ordered.   Vital signs reviewed and are as follows: BP 129/86 (BP Location: Right Arm)   Pulse 97   Temp 97.6 F (36.4 C) (Oral)   Resp 18   SpO2 96%   12:18 PM Spoke with Dr. Rolena Infante earlier -- who will consult with Dr. Alvan Dame. Reccs admit if patient cannot walk.   Spoke with [facility] who will see and admit.     Final Clinical Impressions(s) / ED Diagnoses   Final diagnoses:  Closed fracture of left hip, initial encounter ([place])   Admit.   New Prescriptions New Prescriptions   No medications on file     Carlisle Cater, PA-C 06/22/16 [place], MD 06/27/16 [phone]

## 2016-06-22 NOTE — Progress Notes (Signed)
PHARMACIST - PHYSICIAN ORDER COMMUNICATION  CONCERNING: P&T Medication Policy on Herbal Medications  DESCRIPTION:  This patient's order for:  Flaxseed oil  has been noted.  This product(s) is classified as an "herbal" or natural product. Due to a lack of definitive safety studies or FDA approval, nonstandard manufacturing practices, plus the potential risk of unknown drug-drug interactions while on inpatient medications, the Pharmacy and Therapeutics Committee does not permit the use of "herbal" or natural products of this type within [facility].   ACTION TAKEN: The pharmacy department is unable to verify this order at this time and your patient has been informed of this safety policy. Please reevaluate patient's clinical condition at discharge and address if the herbal or natural product(s) should be resumed at that time.  Dia Sitter, PharmD, BCPS 06/22/2016 3:58 PM

## 2016-06-22 NOTE — H&P (Signed)
History and Physical    Jose [name] ZDG:[id number] DOB: 1930-07-13 DOA: 06/22/2016  I have briefly reviewed the patient's prior medical records in [place]  PCP: Jerlyn Ly, MD  Patient coming from: [facility], friend's home  Chief Complaint: Fall, left hip pain and inability to ambulate  HPI: Jose [name] is a 83 y.o. male with medical history significant of dementia, bilateral hip replacements, hyperlipidemia, diabetes, who is being brought to the emergency room by his wife after having a fall followed by left hip pain and inability to ambulate.  This was apparently a ground-level fall as he was trying to get up and use his walker. Patient has underlying dementia, and has no complaints for me and is not sure why he is here.  There are no reported fevers, chills, chest pains, abdominal pain, nausea, vomiting or diarrhea in the fall today was mechanical and there are no reports of him passing out or having a syncope  ED Course:  In the ED his vital signs are stable, he is on room air and afebrile, his blood work shows a leukocytosis of 18.7 and mild thrombocytopenia with platelets of 130.  Hip x-ray in the ED showed a nondisplaced fracture isolated to the left greater trochanter extending to the femoral stem component of the arthroplasty.  Review of Systems: Unable to obtain review of systems due to underlying dementia  Past Medical History:  Diagnosis Date  . Abnormality of gait   . Backache, unspecified   . Colon polyps    adenomatous  . Dementia 11/12/2012  . Diverticulosis   . Essential and other specified forms of tremor   . Hemorrhoids   . History of bilateral hip replacements 11/12/2012  . Memory loss   . 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 Pain in joint, pelvic region and thigh   . Skin cancer of scalp   . Spinal stenosis, lumbar region, without neurogenic claudication   . Unspecified hereditary and idiopathic  peripheral neuropathy     Past Surgical History:  Procedure Laterality Date  . CATARACT EXTRACTION, BILATERAL  2012  . RETINAL LASER PROCEDURE  2012   retinal wrinkle  . SKIN CANCER EXCISION    . TOTAL HIP ARTHROPLASTY Left 2000  . TOTAL HIP ARTHROPLASTY (aka REPLACEMENT) Right 2011     reports that he has quit smoking. His smoking use included Cigarettes. He has never used smokeless tobacco. He reports that he drinks about 8.4 oz of alcohol per week . He reports that he does not use drugs.  Allergies  Allergen Reactions  . Axona [Flora-Q]     Pt reported  . Gabapentin     Pt reported    Family History  Problem Relation Age of Onset  . Heart failure Mother     Prior to Admission medications   Medication Sig Start Date End Date Taking? Authorizing Provider  Ascorbic Acid (VITAMIN C) 1000 MG tablet Take 1,000 mg by mouth daily.     Yes Historical Provider, MD  buPROPion (WELLBUTRIN SR) 150 MG 12 hr tablet Take 150 mg by mouth daily.  01/27/14  Yes Historical Provider, MD  Cholecalciferol (VITAMIN D) 1000 UNITS capsule Take 1,000 Units by mouth daily.     Yes Historical Provider, MD  dextromethorphan (DELSYM) 30 MG/5ML liquid Take 30 mg by mouth at bedtime as needed for cough.   Yes Historical Provider, MD  donepezil (ARICEPT) 23 MG TABS tablet Take 1 tablet (  23 mg total) by mouth daily. 10/19/15  Yes Star Age, MD  dorzolamide-timolol (COSOPT) 22.3-6.8 MG/ML ophthalmic solution Place 1 drop into the left eye 2 (two) times daily.  09/20/12  Yes Historical Provider, MD  Flaxseed, Linseed, (FLAXSEED OIL) 1000 MG CAPS Take 1,000 mg by mouth daily.     Yes Historical Provider, MD  folic acid (FOLVITE) 703 MCG tablet Take 800 mcg by mouth every evening.    Yes Historical Provider, MD  hydrocortisone (ANUSOL-HC) 25 MG suppository Place 1 suppository (25 mg total) rectally at bedtime. Patient taking differently: Place 25 mg rectally at bedtime as needed for hemorrhoids or itching.   02/23/14  Yes Irene Shipper, MD  loperamide (IMODIUM A-D) 2 MG tablet Take 2-4 mg by mouth 4 (four) times daily as needed for diarrhea or loose stools.   Yes Historical Provider, MD  memantine (NAMENDA XR) 28 MG CP24 24 hr capsule Take 1 capsule (28 mg total) by mouth daily. 09/27/15  Yes Star Age, MD  metFORMIN (GLUCOPHAGE) 500 MG tablet Take 500 mg by mouth daily with breakfast.  10/25/12  Yes Historical Provider, MD  mirabegron ER (MYRBETRIQ) 50 MG TB24 tablet Take 50 mg by mouth daily.   Yes Historical Provider, MD  Misc Natural Products (TURMERIC CURCUMIN) CAPS Take 1 capsule by mouth daily.   Yes Historical Provider, MD  Multiple Vitamin (MULTIVITAMIN) tablet Take 1 tablet by mouth daily.     Yes Historical Provider, MD  potassium chloride (KLOR-CON) 10 MEQ CR tablet Take 10 mEq by mouth 2 (two) times a week.     Yes Historical Provider, MD  Probiotic Product (ALIGN) 4 MG CAPS Take 4 mg by mouth daily.   Yes Historical Provider, MD  simvastatin (ZOCOR) 40 MG tablet Take 40 mg by mouth every evening.    Yes Historical Provider, MD   Physical Exam: Vitals:   06/22/16 1117  BP: 129/86  Pulse: 97  Resp: 18  Temp: 97.6 F (36.4 C)  TempSrc: Oral  SpO2: 96%   Constitutional: NAD, calm, comfortable Vitals:   06/22/16 1117  BP: 129/86  Pulse: 97  Resp: 18  Temp: 97.6 F (36.4 C)  TempSrc: Oral  SpO2: 96%   Eyes: PERRL, lids and conjunctivae normal ENMT: Mucous membranes are moist. Neck: normal, supple, no masses Respiratory: clear to auscultation bilaterally, no wheezing, no crackles. Normal respiratory effort. No accessory muscle use.  Cardiovascular: Regular rate and rhythm, no murmurs / rubs / gallops. No extremity edema. 2+ pedal pulses.  Abdomen: no tenderness, no masses palpated. Bowel sounds positive.  Musculoskeletal: no clubbing / cyanosis. Normal muscle tone.  Skin: no rashes, lesions, ulcers. No induration Neurologic: CN 2-12 grossly intact. Strength 5/5 in all 4.    Psychiatric: Normal judgment and insight. Alert and oriented to person and place only  Labs on Admission: I have personally reviewed following labs and imaging studies  CBC:  Recent Labs Lab 06/22/16 1057  WBC 18.7*  NEUTROABS 16.0*  HGB 15.0  HCT 43.9  MCV 96.9  PLT 500*   Basic Metabolic Panel:  Recent Labs Lab 06/22/16 1057  NA 138  K 3.9  CL 105  CO2 26  GLUCOSE 147*  BUN 17  CREATININE 0.98  CALCIUM 9.3   GFR: CrCl cannot be calculated (Unknown ideal weight.). Liver Function Tests: No results for input(s): AST, ALT, ALKPHOS, BILITOT, PROT, ALBUMIN in the last 168 hours. No results for input(s): LIPASE, AMYLASE in the last 168 hours. No results for  input(s): AMMONIA in the last 168 hours. Coagulation Profile: No results for input(s): INR, PROTIME in the last 168 hours. Cardiac Enzymes: No results for input(s): CKTOTAL, CKMB, CKMBINDEX, TROPONINI in the last 168 hours. BNP (last 3 results) No results for input(s): PROBNP in the last 8760 hours. HbA1C: No results for input(s): HGBA1C in the last 72 hours. CBG: No results for input(s): GLUCAP in the last 168 hours. Lipid Profile: No results for input(s): CHOL, HDL, LDLCALC, TRIG, CHOLHDL, LDLDIRECT in the last 72 hours. Thyroid Function Tests: No results for input(s): TSH, T4TOTAL, FREET4, T3FREE, THYROIDAB in the last 72 hours. Anemia Panel: No results for input(s): VITAMINB12, FOLATE, FERRITIN, TIBC, IRON, RETICCTPCT in the last 72 hours. Urine analysis:    Component Value Date/Time   COLORURINE YELLOW 06/24/2014 2112   APPEARANCEUR CLEAR 06/24/2014 2112   LABSPEC 1.020 06/24/2014 2112   PHURINE 6.0 06/24/2014 2112   GLUCOSEU NEGATIVE 06/24/2014 2112   HGBUR NEGATIVE 06/24/2014 2112   BILIRUBINUR NEGATIVE 06/24/2014 2112   [place] NEGATIVE 06/24/2014 2112   PROTEINUR NEGATIVE 06/24/2014 2112   UROBILINOGEN 0.2 06/24/2014 2112   NITRITE NEGATIVE 06/24/2014 2112   LEUKOCYTESUR NEGATIVE 06/24/2014  2112     Radiological Exams on Admission: Dg Chest 2 View  Result Date: 06/22/2016 CLINICAL DATA:  Pain following fall EXAM: CHEST  2 VIEW COMPARISON:  None. FINDINGS: There is no edema or consolidation. Heart size and pulmonary vascularity are normal. No adenopathy. There is degenerative change in the thoracic spine. IMPRESSION: No edema or consolidation. Electronically Signed   By: Lowella Grip III M.D.   On: 06/22/2016 11:50   Dg Cervical Spine Complete  Result Date: 06/22/2016 CLINICAL DATA:  Fall. EXAM: CERVICAL SPINE - COMPLETE 4+ VIEW COMPARISON:  PET-CT 06/18/2012.  MRI 06/01/2012 . FINDINGS: Diffuse osteopenia and multilevel severe degenerative change. 3 mm anterolisthesis C3-C4. 5 mm anterolisthesis C4-C5. No evidence of fracture or dislocation. Pulmonary apices are clear. IMPRESSION: Diffuse osteopenia and multilevel degenerative change. 3 mm anterolisthesis C3-C4. 5 mm anterolisthesis C4-C5. No evidence of fracture or dislocation. Electronically Signed   By: Marcello Moores  Register   On: 06/22/2016 10:31   Dg Hip Unilat W Or Wo Pelvis 2-3 Views Left  Result Date: 06/22/2016 CLINICAL DATA:  Status post fall, left hip pain. EXAM: DG HIP (WITH OR WITHOUT PELVIS) 2-3V LEFT COMPARISON:  None. FINDINGS: Bilateral total hip arthroplasties. Nondisplaced fracture isolated to the left greater trochanter extending to the femoral stem component of the arthroplasty. No other fracture or dislocation.  Generalized osteopenia. IMPRESSION: Nondisplaced fracture isolated to the left greater trochanter extending to the femoral stem component of the arthroplasty. Electronically Signed   By: Kathreen Devoid   On: 06/22/2016 10:25    EKG: not obtained  Assessment/Plan Active Problems:   Dementia   History of bilateral hip replacements   Hip fracture (HCC)   Diabetes (HCC)   Hyperlipidemia   Leukocytosis   Hip fracture -Orthopedic surgery evaluated patient, do not recommend operative repair, will  admit patient to the hospital for physical therapy evaluation as well as pain control. -Social worker consult, wife tells me that she will be able to get help around the clock if needed at home  Leukocytosis -Likely reactive, he is afebrile, no evidence of infection -Repeat CBC in the morning -For completeness will obtain a urinalysis to rule out a UTI as a cause for his fall  Dementia -Continue home medication with Aricept as well as Namenda  Diabetes mellitus -Continue metformin, sliding scale  insulin  Hyperlipidemia -Continue simvastatin   DVT prophylaxis: Lovenox  Code Status: Full code  Family Communication: d/wwifebedside Disposition Plan: admit to MedSurg Consults called: Orthopedic surgeon    Admission status: Observation  At the point of initial evaluation, it is my clinical opinion that admission for OBSERVATION is reasonable and necessary because the patient's presenting complaints in the context of their chronic conditions represent sufficient risk of deterioration or significant morbidity to constitute reasonable grounds for close observation in the hospital setting, but that the patient may be medically stable for discharge from the hospital within 24 to 48 hours.    Marzetta Board, MD [facility] Pager [phone]  If 7PM-7AM, please contact night-coverage [email or web address] Password [email or web address]  06/22/2016, 1:18 PM

## 2016-06-22 NOTE — ED Notes (Signed)
Bed: WA09 Expected date:  Expected time:  Means of arrival:  Comments: EMS- 80s, fall/hip pain

## 2016-06-23 DIAGNOSIS — F0280 Dementia in other diseases classified elsewhere without behavioral disturbance: Secondary | ICD-10-CM | POA: Diagnosis not present

## 2016-06-23 DIAGNOSIS — G309 Alzheimer's disease, unspecified: Secondary | ICD-10-CM | POA: Diagnosis not present

## 2016-06-23 DIAGNOSIS — S72002A Fracture of unspecified part of neck of left femur, initial encounter for closed fracture: Secondary | ICD-10-CM

## 2016-06-23 DIAGNOSIS — E785 Hyperlipidemia, unspecified: Secondary | ICD-10-CM | POA: Diagnosis not present

## 2016-06-23 DIAGNOSIS — Z96643 Presence of artificial hip joint, bilateral: Secondary | ICD-10-CM | POA: Diagnosis not present

## 2016-06-23 DIAGNOSIS — E118 Type 2 diabetes mellitus with unspecified complications: Secondary | ICD-10-CM

## 2016-06-23 DIAGNOSIS — D72829 Elevated white blood cell count, unspecified: Secondary | ICD-10-CM

## 2016-06-23 LAB — CBC
HCT: 36.3 % — ABNORMAL LOW (ref 39.0–52.0)
Hemoglobin: 12.3 g/dL — ABNORMAL LOW (ref 13.0–17.0)
MCH: 33.2 pg (ref 26.0–34.0)
MCHC: 33.9 g/dL (ref 30.0–36.0)
MCV: 98.1 fL (ref 78.0–100.0)
Platelets: 113 10*3/uL — ABNORMAL LOW (ref 150–400)
RBC: 3.7 MIL/uL — AB (ref 4.22–5.81)
RDW: 13.3 % (ref 11.5–15.5)
WBC: 10.3 10*3/uL (ref 4.0–10.5)

## 2016-06-23 LAB — COMPREHENSIVE METABOLIC PANEL
ALK PHOS: 66 U/L (ref 38–126)
ALT: 23 U/L (ref 17–63)
AST: 18 U/L (ref 15–41)
Albumin: 3.2 g/dL — ABNORMAL LOW (ref 3.5–5.0)
Anion gap: 7 (ref 5–15)
BILIRUBIN TOTAL: 0.7 mg/dL (ref 0.3–1.2)
BUN: 23 mg/dL — AB (ref 6–20)
CALCIUM: 8.4 mg/dL — AB (ref 8.9–10.3)
CO2: 25 mmol/L (ref 22–32)
CREATININE: 1.11 mg/dL (ref 0.61–1.24)
Chloride: 106 mmol/L (ref 101–111)
GFR, EST NON AFRICAN AMERICAN: 59 mL/min — AB (ref >60)
Glucose, Bld: 151 mg/dL — ABNORMAL HIGH (ref 65–99)
Potassium: 3.7 mmol/L (ref 3.5–5.1)
Sodium: 138 mmol/L (ref 135–145)
Total Protein: 5.8 g/dL — ABNORMAL LOW (ref 6.5–8.1)

## 2016-06-23 LAB — GLUCOSE, CAPILLARY
GLUCOSE-CAPILLARY: 123 mg/dL — AB (ref 65–99)
Glucose-Capillary: 122 mg/dL — ABNORMAL HIGH (ref 65–99)
Glucose-Capillary: 119 mg/dL — ABNORMAL HIGH (ref 65–99)

## 2016-06-23 MED ORDER — ALIGN 4 MG PO CAPS
4.0000 mg | ORAL_CAPSULE | Freq: Every day | ORAL | Status: DC
  Filled 2016-06-23: qty 1

## 2016-06-23 MED ORDER — TRAMADOL HCL 50 MG PO TABS: 50.0000 mg | ORAL_TABLET | Freq: Four times a day (QID) | ORAL | 0 refills | Status: DC | PRN

## 2016-06-23 MED ORDER — IBUPROFEN 400 MG PO TABS: 400.0000 mg | ORAL_TABLET | Freq: Four times a day (QID) | ORAL | Status: DC

## 2016-06-23 MED ORDER — ACETAMINOPHEN 325 MG PO TABS: 650.0000 mg | ORAL_TABLET | Freq: Four times a day (QID) | ORAL | Status: DC | PRN

## 2016-06-23 MED ORDER — IBUPROFEN 400 MG PO TABS: 400.0000 mg | ORAL_TABLET | Freq: Four times a day (QID) | ORAL | 0 refills | Status: DC

## 2016-06-23 NOTE — Progress Notes (Addendum)
PROGRESS NOTE    Jose [name]   EGB:[id number]  DOB: Feb 19, 1931  DOA: 06/22/2016 PCP: Jerlyn Ly, MD   Brief Narrative:  Jose [name] is a 83 y.o. male with medical history significant of dementia, bilateral hip replacements, hyperlipidemia, diabetes, who is being brought to the emergency room by his wife after having a fall followed by left hip pain and inability to ambulate.  This was apparently a ground-level fall as he was trying to get up and use his walker. Patient has underlying dementia, and has no complaints for and is not sure why he is here.  There are no reported fevers, chills, chest pains, abdominal pain, nausea, vomiting or diarrhea in the fall today was mechanical and there are no reports of him passing out or having a syncope. Hip x-ray in the ED showed a nondisplaced fracture isolated to the left greater trochanter extending to the femoral stem component of the arthroplasty.  Subjective: His wife tells me today that he fell while she was in the other room and could not move his left leg or stand afterwards. Also states that he will never tell anyone that he is in pain and she felt he was in pain this AM. She states he was subsequently given Tramadol.    Assessment & Plan:   Principal Problem:   Hip fracture-   History of bilateral hip replacements - left trochanteric, nondisplaced- Ortho recommends pain control, WBAT and f/u with Dr Alvan Dame in 4 wks. - cont PRN Tramadol - add QID Ibuprofen  - PT eval today  Active Problems:   Dementia - Aricept, Namenda - follow for behavioral disturbances  Depression/Anxiety? - cont Buproprion    Diabetes Mellitus - Glucophage - SSI    Hyperlipidemia - Zocor    Leukocytosis - WBC 18 - normalized to 10 today- no antibiotics given  DVT prophylaxis: Lovenox Code Status: Full code Family Communication: wife at bedside Disposition Plan: to be determined after PT eval Consultants:   ortho Procedures:    Antimicrobials:    Anti-infectives    None       Objective: Vitals:   06/22/16 1117 06/22/16 1520 06/22/16 2222 06/23/16 0634  BP: 129/86 (!) 143/77 132/72 (!) 148/84  Pulse: 97 96 96 93  Resp: 18 16 16 16   Temp: 97.6 F (36.4 C) 97.9 F (36.6 C) 98 F (36.7 C) 97.6 F (36.4 C)  TempSrc: Oral Oral Oral Oral  SpO2: 96% 100% 97% 98%  Weight:  77.1 kg (170 lb)    Height:  6' (1.829 m)      Intake/Output Summary (Last 24 hours) at 06/23/16 1248 Last data filed at 06/23/16 1050  Gross per 24 hour  Intake              960 ml  Output              201 ml  Net              759 ml   Filed Weights   06/22/16 1520  Weight: 77.1 kg (170 lb)    Examination: General exam: Appears comfortable  HEENT: PERRLA, oral mucosa moist, no sclera icterus or thrush Respiratory system: Clear to auscultation. Respiratory effort normal. Cardiovascular system: S1 & S2 heard, RRR.  No murmurs  Gastrointestinal system: Abdomen soft, non-tender, nondistended. Normal bowel sound. No organomegaly Central nervous system: Alert - confused to time and place- No focal neurological deficits. MSK: Left leg: able to bend at knee, flex  and extend foot- can not lift leg off the bed- lifted passively, can hold it up a few inches off the bed- sensation intact TTP at L hip Extremities: No cyanosis, clubbing or edema Skin: No rashes or ulcers Psychiatry:  Mood & affect appropriate.     Data Reviewed: I have personally reviewed following labs and imaging studies  CBC:  Recent Labs Lab 06/22/16 1057 06/23/16 0430  WBC 18.7* 10.3  NEUTROABS 16.0*  --   HGB 15.0 12.3*  HCT 43.9 36.3*  MCV 96.9 98.1  PLT 130* 062*   Basic Metabolic Panel:  Recent Labs Lab 06/22/16 1057 06/23/16 0430  NA 138 138  K 3.9 3.7  CL 105 106  CO2 26 25  GLUCOSE 147* 151*  BUN 17 23*  CREATININE 0.98 1.11  CALCIUM 9.3 8.4*   GFR: Estimated Creatinine Clearance: 54 mL/min (by C-G formula based on SCr of 1.11 mg/dL). Liver Function  Tests:  Recent Labs Lab 06/23/16 0430  AST 18  ALT 23  ALKPHOS 66  BILITOT 0.7  PROT 5.8*  ALBUMIN 3.2*   No results for input(s): LIPASE, AMYLASE in the last 168 hours. No results for input(s): AMMONIA in the last 168 hours. Coagulation Profile: No results for input(s): INR, PROTIME in the last 168 hours. Cardiac Enzymes: No results for input(s): CKTOTAL, CKMB, CKMBINDEX, TROPONINI in the last 168 hours. BNP (last 3 results) No results for input(s): PROBNP in the last 8760 hours. HbA1C: No results for input(s): HGBA1C in the last 72 hours. CBG:  Recent Labs Lab 06/22/16 1654 06/22/16 2219 06/23/16 0719 06/23/16 1211  GLUCAP 209* 132* 123* 122*   Lipid Profile: No results for input(s): CHOL, HDL, LDLCALC, TRIG, CHOLHDL, LDLDIRECT in the last 72 hours. Thyroid Function Tests: No results for input(s): TSH, T4TOTAL, FREET4, T3FREE, THYROIDAB in the last 72 hours. Anemia Panel: No results for input(s): VITAMINB12, FOLATE, FERRITIN, TIBC, IRON, RETICCTPCT in the last 72 hours. Urine analysis:    Component Value Date/Time   COLORURINE YELLOW 06/24/2014 2112   APPEARANCEUR CLEAR 06/24/2014 2112   LABSPEC 1.020 06/24/2014 2112   PHURINE 6.0 06/24/2014 2112   GLUCOSEU NEGATIVE 06/24/2014 2112   HGBUR NEGATIVE 06/24/2014 2112   BILIRUBINUR NEGATIVE 06/24/2014 2112   KETONESUR NEGATIVE 06/24/2014 2112   PROTEINUR NEGATIVE 06/24/2014 2112   UROBILINOGEN 0.2 06/24/2014 2112   NITRITE NEGATIVE 06/24/2014 2112   LEUKOCYTESUR NEGATIVE 06/24/2014 2112   Sepsis Labs: @LABRCNTIP (procalcitonin:4,lacticidven:4) )No results found for this or any previous visit (from the past 240 hour(s)).       Radiology Studies: Dg Chest 2 View  Result Date: 06/22/2016 CLINICAL DATA:  Pain following fall EXAM: CHEST  2 VIEW COMPARISON:  None. FINDINGS: There is no edema or consolidation. Heart size and pulmonary vascularity are normal. No adenopathy. There is degenerative change in the  thoracic spine. IMPRESSION: No edema or consolidation. Electronically Signed   By: Lowella Grip III M.D.   On: 06/22/2016 11:50   Dg Cervical Spine Complete  Result Date: 06/22/2016 CLINICAL DATA:  Fall. EXAM: CERVICAL SPINE - COMPLETE 4+ VIEW COMPARISON:  PET-CT 06/18/2012.  MRI 06/01/2012 . FINDINGS: Diffuse osteopenia and multilevel severe degenerative change. 3 mm anterolisthesis C3-C4. 5 mm anterolisthesis C4-C5. No evidence of fracture or dislocation. Pulmonary apices are clear. IMPRESSION: Diffuse osteopenia and multilevel degenerative change. 3 mm anterolisthesis C3-C4. 5 mm anterolisthesis C4-C5. No evidence of fracture or dislocation. Electronically Signed   By: Marcello Moores  Register   On: 06/22/2016 10:31   Dg  Hip Unilat W Or Wo Pelvis 2-3 Views Left  Result Date: 06/22/2016 CLINICAL DATA:  Status post fall, left hip pain. EXAM: DG HIP (WITH OR WITHOUT PELVIS) 2-3V LEFT COMPARISON:  None. FINDINGS: Bilateral total hip arthroplasties. Nondisplaced fracture isolated to the left greater trochanter extending to the femoral stem component of the arthroplasty. No other fracture or dislocation.  Generalized osteopenia. IMPRESSION: Nondisplaced fracture isolated to the left greater trochanter extending to the femoral stem component of the arthroplasty. Electronically Signed   By: Kathreen Devoid   On: 06/22/2016 10:25      Scheduled Meds: . ALIGN  4 mg Oral Daily  . buPROPion  150 mg Oral Daily  . cholecalciferol  1,000 Units Oral Daily  . donepezil  23 mg Oral Daily  . dorzolamide-timolol  1 drop Left Eye BID  . enoxaparin (LOVENOX) injection  40 mg Subcutaneous Q24H  . folic acid  1 mg Oral Daily  . insulin aspart  0-9 Units Subcutaneous TID WC  . memantine  28 mg Oral Daily  . metFORMIN  500 mg Oral Q breakfast  . mirabegron ER  50 mg Oral Daily  . multivitamin with minerals  1 tablet Oral Daily  . simvastatin  40 mg Oral QPM  . vitamin C  1,000 mg Oral Daily   Continuous  Infusions:   LOS: 0 days    Time spent in minutes: 57    [place], MD [facility] Pager: [email or web address] Password [facility] 06/23/2016, 12:48 PM

## 2016-06-23 NOTE — Clinical Social Work Placement (Addendum)
   CLINICAL SOCIAL WORK PLACEMENT  NOTE  Date:  06/23/2016  Patient Details  Name: Jose [name] MRN: [id number] Date of Birth: [birth year]  Clinical Social Work is seeking post-discharge placement for this patient at the [place] level of care (*CSW will initial, date and re-position this form in  chart as items are completed):  Yes   Patient/family provided with [place] Work Department's list of facilities offering this level of care within the geographic area requested by the patient (or if unable, by the patient's family).  Yes   Patient/family informed of their freedom to choose among providers that offer the needed level of care, that participate in Medicare, Medicaid or managed care program needed by the patient, have an available bed and are willing to accept the patient.  Yes   Patient/family informed of [place]'s ownership interest in [facility] and [facility], as well as of the fact that they are under no obligation to receive care at these facilities.  PASRR submitted to EDS on 06/23/16     PASRR number received on 06/23/16     Existing PASRR number confirmed on       FL2 transmitted to all facilities in geographic area requested by pt/family on 06/23/16     FL2 transmitted to all facilities within larger geographic area on       Patient informed that his/her managed care company has contracts with or will negotiate with certain facilities, including the following:            06/23/16  Patient/family informed of bed offers received.  Patient chooses bed at     [facility]  Physician recommends and patient chooses bed at      Patient to be transferred to   [facility] on  06/23/16.  Patient to be transferred to facility by    [facility]   Patient family notified on  06/23/16 of transfer.  Name of family member notified:     Dtr and wife at bedside   PHYSICIAN       Additional Comment:     _______________________________________________ Boone Master, [place] 06/23/2016, 10:04 AM

## 2016-06-23 NOTE — Discharge Summary (Signed)
Physician Discharge Summary  Jose [name] Jose [name] DOB: 07-12-1930 DOA: 06/22/2016  PCP: Jose Ly, MD  Admit date: 06/22/2016 Discharge date: 06/23/2016  Admitted From: Independent living Disposition:  SNF   Recommendations for Outpatient Follow-up:  1. Change Ibuprofen to PRN in 3 days 2.    WBAT left leg    Discharge Condition:  stable   CODE STATUS:  Full code   Diet recommendation:  Heart healthy, low sodium Consultations:  ortho    Discharge Diagnoses:  Principal Problem:   Hip fracture (Jose [name]) Active Problems:   Dementia   History of bilateral hip replacements   Diabetes (Jose [name])   Hyperlipidemia   Leukocytosis   Brief Summary: Jose [name] a 83 y.o.malewith medical history significant of dementia, bilateral hip replacements, hyperlipidemia, diabetes, who is being brought to the emergency room by his wife after having a fall followed by left hip pain and inability to ambulate. This was apparently a ground-level fall as he was trying to get up and use his walker. Patient has underlying dementia, and has no complaints for and is not sure why he is here. There are no reported fevers, chills, chest pains, abdominal pain, nausea, vomiting or diarrhea in the fall today was mechanical and there are no reports of him passing out or having a syncope. Hip x-ray in the ED showed a nondisplaced fracture isolated to the left greater trochanter extending to the femoral stem component of the arthroplasty.  Subjective: His wife tells me today that he fell while she was in the other room and could not move his left leg or stand afterwards. Also states that he will never tell anyone that he is in pain and she felt he was in pain this AM. She states he was subsequently given Tramadol.    Assessment & Plan:   Principal Problem:   Hip fracture-   History of bilateral hip replacements - left trochanteric, nondisplaced- Ortho recommends pain control, WBAT and f/u with Jose [name] in 4  wks. - cont PRN Tramadol - add QID Ibuprofen  - PT eval today- recommended SNF  Active Problems:   Dementia - Aricept, Namenda - follow for behavioral disturbances  Depression/Anxiety? - cont Buproprion    Diabetes Mellitus - Glucophage - SSI    Hyperlipidemia - Zocor    Leukocytosis - WBC 18 - normalized to 10 today- no antibiotics given    Discharge Instructions  Discharge Instructions    Diet - low sodium heart healthy    Complete by:  As directed    Increase activity slowly    Complete by:  As directed      Allergies as of 06/23/2016      Reactions   Axona [flora-q]    Pt reported   Gabapentin    Pt reported      Medication List    TAKE these medications   acetaminophen 325 MG tablet Commonly known as:  TYLENOL Take 2 tablets (650 mg total) by mouth every 6 (six) hours as needed for mild pain (or Fever >/= 101).   ALIGN 4 MG Caps Take 4 mg by mouth daily.   buPROPion 150 MG 12 hr tablet Commonly known as:  WELLBUTRIN SR Take 150 mg by mouth daily.   DELSYM 30 MG/5ML liquid Generic drug:  dextromethorphan Take 30 mg by mouth at bedtime as needed for cough.   donepezil 23 MG Tabs tablet Commonly known as:  ARICEPT Take 1 tablet (23 mg total) by mouth daily.  dorzolamide-timolol 22.3-6.8 MG/ML ophthalmic solution Commonly known as:  COSOPT Place 1 drop into the left eye 2 (two) times daily.   Flaxseed Oil 1000 MG Caps Take 1,000 mg by mouth daily.   folic acid 035 MCG tablet Commonly known as:  FOLVITE Take 800 mcg by mouth every evening.   hydrocortisone 25 MG suppository Commonly known as:  ANUSOL-HC Place 1 suppository (25 mg total) rectally at bedtime. What changed:  when to take this  reasons to take this   ibuprofen 400 MG tablet Commonly known as:  ADVIL,MOTRIN Take 1 tablet (400 mg total) by mouth 4 (four) times daily. For 3 days. Then Every 4 hrs PRN moderate pain.   loperamide 2 MG tablet Commonly known as:   IMODIUM A-D Take 2-4 mg by mouth 4 (four) times daily as needed for diarrhea or loose stools.   memantine 28 MG Cp24 24 hr capsule Commonly known as:  NAMENDA XR Take 1 capsule (28 mg total) by mouth daily.   metFORMIN 500 MG tablet Commonly known as:  GLUCOPHAGE Take 500 mg by mouth daily with breakfast.   multivitamin tablet Take 1 tablet by mouth daily.   MYRBETRIQ 50 MG Tb24 tablet Generic drug:  mirabegron ER Take 50 mg by mouth daily.   potassium chloride 10 MEQ CR tablet Commonly known as:  KLOR-CON Take 10 mEq by mouth 2 (two) times a week.   simvastatin 40 MG tablet Commonly known as:  ZOCOR Take 40 mg by mouth every evening.   traMADol 50 MG tablet Commonly known as:  ULTRAM Take 1 tablet (50 mg total) by mouth every 6 (six) hours as needed for severe pain.   Turmeric Curcumin Caps Take 1 capsule by mouth daily.   vitamin C 1000 MG tablet Take 1,000 mg by mouth daily.   Vitamin D 1000 units capsule Take 1,000 Units by mouth daily.       Allergies  Allergen Reactions  . Axona [Flora-Q]     Pt reported  . Gabapentin     Pt reported     Procedures/Studies:   Dg Chest 2 View  Result Date: 06/22/2016 CLINICAL DATA:  Pain following fall EXAM: CHEST  2 VIEW COMPARISON:  None. FINDINGS: There is no edema or consolidation. Heart size and pulmonary vascularity are normal. No adenopathy. There is degenerative change in the thoracic spine. IMPRESSION: No edema or consolidation. Electronically Signed   By: Lowella Grip III M.D.   On: 06/22/2016 11:50   Dg Cervical Spine Complete  Result Date: 06/22/2016 CLINICAL DATA:  Fall. EXAM: CERVICAL SPINE - COMPLETE 4+ VIEW COMPARISON:  PET-CT 06/18/2012.  MRI 06/01/2012 . FINDINGS: Diffuse osteopenia and multilevel severe degenerative change. 3 mm anterolisthesis C3-C4. 5 mm anterolisthesis C4-C5. No evidence of fracture or dislocation. Pulmonary apices are clear. IMPRESSION: Diffuse osteopenia and multilevel  degenerative change. 3 mm anterolisthesis C3-C4. 5 mm anterolisthesis C4-C5. No evidence of fracture or dislocation. Electronically Signed   By: Marcello Moores  Register   On: 06/22/2016 10:31   Dg Hip Unilat W Or Wo Pelvis 2-3 Views Left  Result Date: 06/22/2016 CLINICAL DATA:  Status post fall, left hip pain. EXAM: DG HIP (WITH OR WITHOUT PELVIS) 2-3V LEFT COMPARISON:  None. FINDINGS: Bilateral total hip arthroplasties. Nondisplaced fracture isolated to the left greater trochanter extending to the femoral stem component of the arthroplasty. No other fracture or dislocation.  Generalized osteopenia. IMPRESSION: Nondisplaced fracture isolated to the left greater trochanter extending to the femoral stem component of the  arthroplasty. Electronically Signed   By: Kathreen Devoid   On: 06/22/2016 10:25        Discharge Exam: Vitals:   06/23/16 0634 06/23/16 1346  BP: (!) 148/84 129/64  Pulse: 93 80  Resp: 16 16  Temp: 97.6 F (36.4 C) 98.1 F (36.7 C)   Vitals:   06/22/16 1520 06/22/16 2222 06/23/16 0634 06/23/16 1346  BP: (!) 143/77 132/72 (!) 148/84 129/64  Pulse: 96 96 93 80  Resp: 16 16 16 16   Temp: 97.9 F (36.6 C) 98 F (36.7 C) 97.6 F (36.4 C) 98.1 F (36.7 C)  TempSrc: Oral Oral Oral Oral  SpO2: 100% 97% 98% 97%  Weight: 77.1 kg (170 lb)     Height: 6' (1.829 m)       General: Pt is alert, awake, not in acute distress Cardiovascular: RRR, S1/S2 +, no rubs, no gallops Respiratory: CTA bilaterally, no wheezing, no rhonchi Abdominal: Soft, NT, ND, bowel sounds + Extremities: no edema, no cyanosis    The results of significant diagnostics from this hospitalization (including imaging, microbiology, ancillary and laboratory) are listed below for reference.     Microbiology: No results found for this or any previous visit (from the past 240 hour(s)).   Labs: BNP (last 3 results) No results for input(s): BNP in the last 8760 hours. Basic Metabolic Panel:  Recent Labs Lab  06/22/16 1057 06/23/16 0430  NA 138 138  K 3.9 3.7  CL 105 106  CO2 26 25  GLUCOSE 147* 151*  BUN 17 23*  CREATININE 0.98 1.11  CALCIUM 9.3 8.4*   Liver Function Tests:  Recent Labs Lab 06/23/16 0430  AST 18  ALT 23  ALKPHOS 66  BILITOT 0.7  PROT 5.8*  ALBUMIN 3.2*   No results for input(s): LIPASE, AMYLASE in the last 168 hours. No results for input(s): AMMONIA in the last 168 hours. CBC:  Recent Labs Lab 06/22/16 1057 06/23/16 0430  WBC 18.7* 10.3  NEUTROABS 16.0*  --   HGB 15.0 12.3*  HCT 43.9 36.3*  MCV 96.9 98.1  PLT 130* 113*   Cardiac Enzymes: No results for input(s): CKTOTAL, CKMB, CKMBINDEX, TROPONINI in the last 168 hours. BNP: Invalid input(s): POCBNP CBG:  Recent Labs Lab 06/22/16 1654 06/22/16 2219 06/23/16 0719 06/23/16 1211  GLUCAP 209* 132* 123* 122*   D-Dimer No results for input(s): DDIMER in the last 72 hours. Hgb A1c No results for input(s): HGBA1C in the last 72 hours. Lipid Profile No results for input(s): CHOL, HDL, LDLCALC, TRIG, CHOLHDL, LDLDIRECT in the last 72 hours. Thyroid function studies No results for input(s): TSH, T4TOTAL, T3FREE, THYROIDAB in the last 72 hours.  Invalid input(s): FREET3 Anemia work up No results for input(s): VITAMINB12, FOLATE, FERRITIN, TIBC, IRON, RETICCTPCT in the last 72 hours. Urinalysis    Component Value Date/Time   COLORURINE YELLOW 06/24/2014 2112   APPEARANCEUR CLEAR 06/24/2014 2112   LABSPEC 1.020 06/24/2014 2112   PHURINE 6.0 06/24/2014 2112   GLUCOSEU NEGATIVE 06/24/2014 2112   HGBUR NEGATIVE 06/24/2014 2112   BILIRUBINUR NEGATIVE 06/24/2014 2112   [place] NEGATIVE 06/24/2014 2112   PROTEINUR NEGATIVE 06/24/2014 2112   UROBILINOGEN 0.2 06/24/2014 2112   NITRITE NEGATIVE 06/24/2014 2112   LEUKOCYTESUR NEGATIVE 06/24/2014 2112   Sepsis Labs Invalid input(s): PROCALCITONIN,  WBC,  LACTICIDVEN Microbiology No results found for this or any previous visit (from the past  240 hour(s)).   Time coordinating discharge: Over 30 minutes  SIGNED:   Debbe Odea,  MD  [facility] 06/23/2016, 3:38 PM Pager   If 7PM-7AM, please contact night-coverage [email or web address] Password [email or web address]

## 2016-06-23 NOTE — Care Management Note (Signed)
Case Management Note  Patient Details  Name: Jose [name] MRN: [id number] Date of Birth: 1930-05-26  Subjective/Objective:    Hip Fracture                Action/Plan: Discharge Planning: AVS reviewed: Chart reviewed. CSW following for dc to SNF-rehab. Spoke to pt and wife at bedside.   PCP Crist Infante   Expected Discharge Date:  06/23/16               Expected Discharge Plan:  Skilled Nursing Facility  In-House Referral:  Clinical Social Work  Discharge planning Services  CM Consult  Post Acute Care Choice:  NA Choice offered to:  NA  DME Arranged:  N/A DME Agency:  NA  HH Arranged:  NA HH Agency:  NA  Status of Service:  Completed, signed off  If discussed at [employer] of Stay Meetings, dates discussed:    Additional Comments:  Erenest Rasher, RN 06/23/2016, 4:51 PM

## 2016-06-23 NOTE — NC FL2 (Signed)
[place] LEVEL OF CARE SCREENING TOOL     IDENTIFICATION  Patient Name: Jose [name] Birthdate: [birth year] Sex: male Admission Date (Current Location): 06/22/2016  [facility] and [place] Number:  Herbalist and Address:  [facility],  [place] [address], [place]      Provider Number: [id number]  Attending Physician Name and Address:  Debbe Odea, MD  Relative Name and Phone Number:       Current Level of Care: Hospital Recommended Level of Care: [place] Prior Approval Number:    Date Approved/Denied:   PASRR Number: [id number]  Discharge Plan: SNF    Current Diagnoses: Patient Active Problem List   Diagnosis Date Noted  . Hip fracture ([place]) 06/22/2016  . Diabetes ([place]) 06/22/2016  . Hyperlipidemia 06/22/2016  . Leukocytosis 06/22/2016  . Dementia 11/12/2012  . History of bilateral hip replacements 11/12/2012    Orientation RESPIRATION BLADDER Height & Weight     Self, Time, Situation, Place  Normal Continent Weight: 170 lb (77.1 kg) Height:  6' (182.9 cm)  BEHAVIORAL SYMPTOMS/MOOD NEUROLOGICAL BOWEL NUTRITION STATUS      Continent Diet (Heart Healthy/Carb Modified )  AMBULATORY STATUS COMMUNICATION OF NEEDS Skin   Extensive Assist Verbally Normal                       Personal Care Assistance Level of Assistance  Bathing, Feeding, Dressing Bathing Assistance: Maximum assistance Feeding assistance: Independent Dressing Assistance: Maximum assistance     Functional Limitations Info  Sight, Hearing, Speech Sight Info: Adequate Hearing Info: Adequate Speech Info: Adequate    SPECIAL CARE FACTORS FREQUENCY  PT (By licensed PT), OT (By licensed OT)                    Contractures Contractures Info: Not present    Additional Factors Info  Code Status, Allergies, Insulin Sliding Scale Code Status Info: Full Allergies Info: Axona Flora-q, Gabapentin           Current  Medications (06/23/2016):  This is the current hospital active medication list Current Facility-Administered Medications  Medication Dose Route Frequency Provider Last Rate Last Dose  . acetaminophen (TYLENOL) tablet 650 mg  650 mg Oral Q6H PRN Costin Karlyne Greenspan, MD       Or  . acetaminophen (TYLENOL) suppository 650 mg  650 mg Rectal Q6H PRN Costin Karlyne Greenspan, MD      . Humphrey Rolls CAPS 4 mg  4 mg Oral Daily Debbe Odea, MD      . buPROPion (WELLBUTRIN SR) 12 hr tablet 150 mg  150 mg Oral Daily Costin Karlyne Greenspan, MD   150 mg at 06/22/16 1818  . cholecalciferol (VITAMIN D) tablet 1,000 Units  1,000 Units Oral Daily Caren Griffins, MD   1,000 Units at 06/22/16 2214  . dextromethorphan (DELSYM) 30 MG/5ML liquid 30 mg  30 mg Oral QHS PRN Costin Karlyne Greenspan, MD      . donepezil (ARICEPT) tablet 23 mg  23 mg Oral Daily Costin Karlyne Greenspan, MD   23 mg at 06/22/16 1730  . dorzolamide-timolol (COSOPT) 22.3-6.8 MG/ML ophthalmic solution 1 drop  1 drop Left Eye BID Caren Griffins, MD   1 drop at 06/22/16 2214  . enoxaparin (LOVENOX) injection 40 mg  40 mg Subcutaneous Q24H Caren Griffins, MD   40 mg at 06/22/16 1730  . folic acid (FOLVITE) tablet 1 mg  1 mg Oral Daily  Caren Griffins, MD      . insulin aspart (novoLOG) injection 0-9 Units  0-9 Units Subcutaneous TID WC Costin Karlyne Greenspan, MD   1 Units at 06/23/16 0900  . loperamide (IMODIUM) capsule 2-4 mg  2-4 mg Oral QID PRN Caren Griffins, MD      . memantine (NAMENDA XR) 24 hr capsule 28 mg  28 mg Oral Daily Costin Karlyne Greenspan, MD   28 mg at 06/22/16 1730  . metFORMIN (GLUCOPHAGE) tablet 500 mg  500 mg Oral Q breakfast Costin Karlyne Greenspan, MD   500 mg at 06/23/16 0900  . mirabegron ER (MYRBETRIQ) tablet 50 mg  50 mg Oral Daily Costin Karlyne Greenspan, MD   50 mg at 06/22/16 1730  . multivitamin with minerals tablet 1 tablet  1 tablet Oral Daily Costin Karlyne Greenspan, MD      . simvastatin (ZOCOR) tablet 40 mg  40 mg Oral QPM Costin Karlyne Greenspan, MD   40 mg at 06/22/16 1730  .  traMADol (ULTRAM) tablet 25-50 mg  25-50 mg Oral Q6H PRN Caren Griffins, MD   50 mg at 06/23/16 0049  . vitamin C (ASCORBIC ACID) tablet 1,000 mg  1,000 mg Oral Daily Costin Karlyne Greenspan, MD         Discharge Medications: Please see discharge summary for a list of discharge medications.  Relevant Imaging Results:  Relevant Lab Results:   Additional Information    Boone Master, LCSW Weekend Coverage

## 2016-06-23 NOTE — Progress Notes (Addendum)
Clinical Social Work  CSW was contacted by [employer] Rod Holler) who reported Market researcher reviewed case and feels pt is ready to DC today. CSW spoke with attending MD who will complete DC paperwork. Family is aware of DC and agreeable to placement at [facility] Pt will be admitted to [facility] unit room 51. RN to call report to DL at [phone].   CSW will arrange PTAR once DC paperwork is completed.  Sindy Messing, LCSW Weekend Coverage  Addendum 1640 DC summary completed and sent to SNF. Family aware and agreeable to DC and PTAR has been arranged. RN aware and will call report to SNF. CSW is signing off but available if needed.

## 2016-06-23 NOTE — Progress Notes (Signed)
Call and gave report to [place] at [facility] unit. Pt escorted off unit via PTAR

## 2016-06-23 NOTE — Evaluation (Signed)
Physical Therapy Evaluation Patient Details Name: Jose [name] MRN: [id number] DOB: [birth year] Today's Date: 06/23/2016   History of Present Illness  Jose [name] a 83 y.o.malewith medical history significant of dementia, bilateral hip replacements, hyperlipidemia, diabetes, who is being brought to the emergency room by his wife after having a fall followed by left hip pain and inability to ambulate. This was apparently a ground-level fall as he was trying to get up and use his walker. Patient has underlying dementia,   in the fall today was mechanical . Hip x-ray in the ED showed a nondisplaced fracture isolated to the left greater trochanter extending to the femoral stem component of the arthroplasty.  Clinical Impression  The patient was assisted to  Stand and pivot to  Recliner and back to bed for safety. Pt admitted with above diagnosis. Pt currently with functional limitations due to the deficits listed below (see PT Problem List).  Pt will benefit from skilled PT to increase their independence and safety with mobility to allow discharge to the venue listed below.     Follow Up Recommendations SNF;Supervision/Assistance - 24 hour    Equipment Recommendations  None recommended by PT    Recommendations for Other Services       Precautions / Restrictions Precautions Precautions: Fall Precaution Comments: dementia Restrictions Weight Bearing Restrictions: No LLE Weight Bearing: Weight bearing as tolerated      Mobility  Bed Mobility Overal bed mobility: Needs Assistance Bed Mobility: Supine to Sit;Sit to Supine     Supine to sit: +2 for physical assistance;+2 for safety/equipment Sit to supine: +2 for physical assistance;+2 for safety/equipment   General bed mobility comments: assist for legs and trunk to sitting and return to bed.  Transfers Overall transfer level: Needs assistance Equipment used: Rolling walker (2 wheeled) Transfers: Sit to/from [employer] Sit to Stand: +2 physical assistance;+2 safety/equipment;Max assist Stand pivot transfers: +2 physical assistance;+2 safety/equipment;Max assist       General transfer comment: assist to rise and stand from bed  and recliner, to take pivot steps to the recliner  and to the bed. Left leg buckles under his weight.  Ambulation/Gait                Stairs            Wheelchair Mobility    Modified Rankin (Stroke Patients Only)       Balance                                             Pertinent Vitals/Pain Pain Assessment: Faces Faces Pain Scale: Hurts little more Pain Location: left hip Pain Descriptors / Indicators: Discomfort;Grimacing    Home Living Family/patient expects to be discharged to:: Skilled nursing facility Living Arrangements: Spouse/significant other               Additional Comments: has caregivers when wife is out    Prior Function Level of Independence: Needs assistance   Gait / Transfers Assistance Needed: ambulates with Rw in apt.           Hand Dominance        Extremity/Trunk Assessment   Upper Extremity Assessment Upper Extremity Assessment: Overall WFL for tasks assessed    Lower Extremity Assessment Lower Extremity Assessment: LLE deficits/detail LLE Deficits / Details: decreased support of the left leg with standing  Communication      Cognition Arousal/Alertness: Awake/alert Behavior During Therapy: WFL for tasks assessed/performed Overall Cognitive Status: Difficult to assess Area of Impairment: Orientation;Memory;Following commands                                      General Comments      Exercises     Assessment/Plan    PT Assessment Patient needs continued PT services  PT Problem List Decreased strength;Decreased range of motion;Decreased activity tolerance;Decreased mobility;Decreased cognition       PT Treatment Interventions DME  instruction;Gait training;Functional mobility training;Therapeutic activities;Therapeutic exercise;Patient/family education    PT Goals (Current goals can be found in the Care Plan section)  Acute Rehab PT Goals Patient Stated Goal: agreed to get up. per wife to go to rehab  PT Goal Formulation: With family Time For Goal Achievement: 07/07/16 Potential to Achieve Goals: Good    Frequency Min 3X/week   Barriers to discharge        Co-evaluation               End of Session   Activity Tolerance: Patient tolerated treatment well Patient left: in bed;with call bell/phone within reach Nurse Communication: Mobility status PT Visit Diagnosis: Unsteadiness on feet (R26.81);History of falling (Z91.81);Difficulty in walking, not elsewhere classified (R26.2)    Time: 8182-9937 PT Time Calculation (min) (ACUTE ONLY): 27 min   Charges:   PT Evaluation $PT Eval Low Complexity: 1 Procedure PT Treatments $Therapeutic Activity: 8-22 mins   PT G Codes:   PT G-Codes **NOT FOR INPATIENT CLASS** Functional Assessment Tool Used: AM-PAC 6 Clicks Basic Mobility;Clinical judgement Functional Limitation: Mobility: Walking and moving around Mobility: Walking and Moving Around Current Status (J6967): 100 percent impaired, limited or restricted Mobility: Walking and Moving Around Goal Status (E9381): At least 20 percent but less than 40 percent impaired, limited or restricted      Claretha Cooper 06/23/2016, 3:19 PM Tresa Endo PT [phone]

## 2016-06-23 NOTE — Clinical Social Work Note (Signed)
Clinical Social Work Assessment  Patient Details  Name: Jose [name] MRN: [id number] Date of Birth: 11/11/1930  Date of referral:  06/23/16               Reason for consult:  Discharge Planning                Permission sought to share information with:  Family Supports Permission granted to share information::  Yes, Verbal Permission Granted  Name::     [name]  Agency::     Relationship::  Spouse  Contact Information:     Housing/Transportation Living arrangements for the past 2 months:  Charity fundraiser of Information:  Patient, Spouse, Adult Children Patient Interpreter Needed:  None Criminal Activity/Legal Involvement Pertinent to Current Situation/Hospitalization:  No - Comment as needed Significant Relationships:  Adult Children, Spouse Lives with:  Spouse Do you feel safe going back to the place where you live?  Yes Need for family participation in patient care:  Yes (Comment)  Care giving concerns:  From independent living and wife is interested in SNF.   Social Worker assessment / plan:  CSW reviewed chart and met with pt at bedside. Pt was drowsy and unable to fully participate. CSW spoke with dtr and wife at bedside who reports that pt and wife live together in an apartment in the independent living at [facility]. Wife prefers for pt to go to rehab at their facility but CSW spoke with RN Danae Chen) at facility who reports that they have no available beds in their rehab. FH [place] believes that [facility] has available space. Wife is agreeable to [facility] if space is available. CSW left a message with admissions and requested a return call. CSW will keep family updated.  CSW completed FL2 and PASRR and faxed information to [place] [facility].  Employment status:  Retired Nurse, adult PT Recommendations:  Not assessed at this time Information / Referral to community resources:  [place]  Patient/Family's Response  to care:  Wife and dtr were calm and engaged. Both asked appropriate questions.  Patient/Family's Understanding of and Emotional Response to Diagnosis, Current Treatment, and Prognosis:  Wife is hopeful that [facility] can accept pt so that she can visit him and feel comfortable with his care.  Emotional Assessment Appearance:  Appears stated age Attitude/Demeanor/Rapport:  Sedated Affect (typically observed):  Unable to Assess (Pt was drowsy) Orientation:  Oriented to Self, Oriented to Place, Oriented to  Time Alcohol / Substance use:  Never Used Psych involvement (Current and /or in the community):  No (Comment)  Discharge Needs  Concerns to be addressed:  No discharge needs identified Readmission within the last 30 days:  No Current discharge risk:  None Barriers to Discharge:  No Barriers Identified   Boone Master, [place] 06/23/2016, 10:07 AM Weekend Coverage

## 2016-06-23 NOTE — Care Management Obs Status (Signed)
[place] NOTIFICATION   Patient Details  Name: CANDLER GINSBERG MRN: [id number] Date of Birth: [birth year]   Medicare Observation Status Notification Given:  Yes    Erenest Rasher, RN 06/23/2016, 4:21 PM

## 2016-06-25 ENCOUNTER — Encounter: Payer: Self-pay | Admitting: Nurse Practitioner

## 2016-06-25 ENCOUNTER — Non-Acute Institutional Stay (SKILLED_NURSING_FACILITY): Payer: Medicare Other | Admitting: Nurse Practitioner

## 2016-06-25 DIAGNOSIS — F0280 Dementia in other diseases classified elsewhere without behavioral disturbance: Secondary | ICD-10-CM | POA: Diagnosis not present

## 2016-06-25 DIAGNOSIS — G309 Alzheimer's disease, unspecified: Secondary | ICD-10-CM | POA: Diagnosis not present

## 2016-06-25 DIAGNOSIS — R350 Frequency of micturition: Secondary | ICD-10-CM | POA: Diagnosis not present

## 2016-06-25 DIAGNOSIS — S72002D Fracture of unspecified part of neck of left femur, subsequent encounter for closed fracture with routine healing: Secondary | ICD-10-CM

## 2016-06-25 DIAGNOSIS — E118 Type 2 diabetes mellitus with unspecified complications: Secondary | ICD-10-CM | POA: Diagnosis not present

## 2016-06-25 DIAGNOSIS — R21 Rash and other nonspecific skin eruption: Secondary | ICD-10-CM

## 2016-06-25 NOTE — Progress Notes (Signed)
Location:  [facility] Room Number: 23 Place of Service:  SNF (31) Provider:  Mast, Manxie  NP  Jerlyn Ly, MD  Patient Care Team: Crist Infante, MD as PCP - General (Internal Medicine)  Extended Emergency Contact Information Primary Emergency Contact: [name] Address: [place], [place] Phone: [phone] Mobile Phone: [phone] Relation: Spouse Secondary Emergency Contact: [name] [place] of [place] Phone: [phone] Work Phone: [phone] Relation: Daughter  Code Status:  Full Code Goals of care: Advanced Directive information Advanced Directives 06/25/2016  Does Patient Have a Medical Advance Directive? -  Type of Advance Directive Out of facility DNR (pink MOST or yellow form)  Does patient want to make changes to medical advance directive? No - Patient declined  Would patient like information on creating a medical advance directive? -  Pre-existing out of facility DNR order (yellow form or pink MOST form) Yellow form placed in chart (order not valid for inpatient use)     Chief Complaint  Patient presents with  . Acute Visit    (L) hip fx admit, open area on cocoxxyl bone    HPI:  Pt is a 83 y.o. male seen today for evaluation of redness in sacrolcoccogeal and buttocks, no open areas noted.   06/22/16 to 06/23/16 WBAT left leg, left Hip x-ray in the ED showed a nondisplaced fracture isolated to the left greater trochanter extending to the femoral stem component of the arthroplasty, Hx of bilateral hip replacements, f/u Dr. Alvan Dame in 4weeks.   Hx of dementia, taking Aricept 25mg  Namenda 28mg  daily. Depression/anxiety, takign Wellbutrin 150mg  bid. T2DM, taking Glucophage. Urinary frequency, taking Myrbetriq   Past Medical History:  Diagnosis Date  . Abnormality of gait   . Backache, unspecified   . Colon polyps    adenomatous  . Dementia 11/12/2012  .  Diverticulosis   . Essential and other specified forms of tremor   . Hemorrhoids   . History of bilateral hip replacements 11/12/2012  . Memory loss   . 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 Pain in joint, pelvic region and thigh   . Skin cancer of scalp   . Spinal stenosis, lumbar region, without neurogenic claudication   . Unspecified hereditary and idiopathic peripheral neuropathy    Past Surgical History:  Procedure Laterality Date  . CATARACT EXTRACTION, BILATERAL  2012  . RETINAL LASER PROCEDURE  2012   retinal wrinkle  . SKIN CANCER EXCISION    . TOTAL HIP ARTHROPLASTY Left 2000  . TOTAL HIP ARTHROPLASTY (aka REPLACEMENT) Right 2011    Allergies  Allergen Reactions  . Axona [Flora-Q]     Pt reported  . Gabapentin     Pt reported    Allergies as of 06/25/2016      Reactions   Axona [flora-q]    Pt reported   Gabapentin    Pt reported      Medication List       Accurate as of 06/25/16  5:33 PM. Always use your most recent med list.          acetaminophen 325 MG tablet Commonly known as:  TYLENOL Take 2 tablets (650 mg total) by mouth every 6 (six) hours as needed for mild pain (or Fever >/= 101).   ALIGN 4 MG Caps Take 4 mg by mouth daily.   buPROPion 150 MG 12 hr  tablet Commonly known as:  WELLBUTRIN SR Take 150 mg by mouth daily.   DELSYM 30 MG/5ML liquid Generic drug:  dextromethorphan Take 30 mg by mouth at bedtime as needed for cough.   donepezil 23 MG Tabs tablet Commonly known as:  ARICEPT Take 1 tablet (23 mg total) by mouth daily.   dorzolamide-timolol 22.3-6.8 MG/ML ophthalmic solution Commonly known as:  COSOPT Place 1 drop into the left eye 2 (two) times daily.   Flaxseed Oil 1000 MG Caps Take 1,000 mg by mouth daily.   folic acid 505 MCG tablet Commonly known as:  FOLVITE Take 800 mcg by mouth every evening.   hydrocortisone 25 MG suppository Commonly known as:  ANUSOL-HC Place 1  suppository (25 mg total) rectally at bedtime.   ibuprofen 400 MG tablet Commonly known as:  ADVIL,MOTRIN Take 1 tablet (400 mg total) by mouth 4 (four) times daily. For 3 days. Then Every 4 hrs PRN moderate pain.   loperamide 2 MG tablet Commonly known as:  IMODIUM A-D Take 2-4 mg by mouth 4 (four) times daily as needed for diarrhea or loose stools.   memantine 28 MG Cp24 24 hr capsule Commonly known as:  NAMENDA XR Take 1 capsule (28 mg total) by mouth daily.   metFORMIN 500 MG tablet Commonly known as:  GLUCOPHAGE Take 500 mg by mouth daily with breakfast.   multivitamin tablet Take 1 tablet by mouth daily.   MYRBETRIQ 50 MG Tb24 tablet Generic drug:  mirabegron ER Take 50 mg by mouth daily.   potassium chloride 10 MEQ CR tablet Commonly known as:  KLOR-CON Take 10 mEq by mouth 2 (two) times a week.   simvastatin 40 MG tablet Commonly known as:  ZOCOR Take 40 mg by mouth every evening.   traMADol 50 MG tablet Commonly known as:  ULTRAM Take 1 tablet (50 mg total) by mouth every 6 (six) hours as needed for severe pain.   Turmeric Curcumin Caps Take 1 capsule by mouth daily.   vitamin C 1000 MG tablet Take 1,000 mg by mouth daily.   Vitamin D 1000 units capsule Take 1,000 Units by mouth daily.       Review of Systems  Constitutional: Positive for activity change. Negative for fatigue and unexpected weight change.  HENT: Positive for hearing loss. Negative for congestion, drooling, ear pain and mouth sores.   Eyes: Negative for pain, discharge, redness, itching and visual disturbance.  Respiratory: Negative for cough, chest tightness and shortness of breath.   Cardiovascular: Negative for chest pain, palpitations and leg swelling.  Gastrointestinal: Negative for abdominal distention, abdominal pain and constipation.  Endocrine: Negative for cold intolerance, heat intolerance, polydipsia and polyphagia.  Genitourinary: Positive for frequency. Negative for  dysuria and urgency.  Musculoskeletal: Positive for arthralgias and gait problem. Negative for joint swelling.       Left hip fx  Skin: Positive for rash. Negative for color change.       buttocks  Allergic/Immunologic: Negative for food allergies.  Neurological: Negative for dizziness, syncope, facial asymmetry, speech difficulty, numbness and headaches.  Hematological: Negative for adenopathy. Does not bruise/bleed easily.  Psychiatric/Behavioral: Positive for confusion. Negative for agitation, behavioral problems, decreased concentration, hallucinations and sleep disturbance. The patient is not nervous/anxious.      There is no immunization history on file for this patient. Pertinent  Health Maintenance Due  Topic Date Due  . HEMOGLOBIN A1C  Aug 04, 1930  . FOOT EXAM  06/06/1942  . OPHTHALMOLOGY EXAM  06/06/1942  .  URINE MICROALBUMIN  06/06/1942  . PNA vac Low Risk Adult (1 of 2 - PCV13) 06/05/1997  . INFLUENZA VACCINE  11/01/2015   Fall Risk  01/19/2016 07/21/2015  Falls in the past year? Yes Yes  Number falls in past yr: 1 1  Injury with Fall? No No  Risk for fall due to : Impaired balance/gait -  Follow up Falls prevention discussed Falls prevention discussed   Functional Status Survey:    Vitals:   06/25/16 1623  BP: 132/76  Pulse: 76  Resp: 18  Temp: 98 F (36.7 C)  Weight: 170 lb (77.1 kg)  Height: 6\' 1"  (1.854 m)   Body mass index is 22.43 kg/m. Physical Exam  Constitutional: He appears well-developed and well-nourished.  HENT:  Head: Normocephalic and atraumatic.  Eyes: Conjunctivae and EOM are normal. Pupils are equal, round, and reactive to light.  Neck: Normal range of motion. Neck supple. No thyromegaly present.  Cardiovascular: Normal rate, regular rhythm and normal heart sounds.   No murmur heard. Pulmonary/Chest: Effort normal and breath sounds normal. No respiratory distress. He has no wheezes.  Abdominal: Soft. Bowel sounds are normal. He exhibits  no distension. There is no tenderness.  Musculoskeletal: He exhibits tenderness. He exhibits no edema.  Bilateral hip replacements, left hip fx, WBAT, f/u Ortho  Neurological: He is alert. He has normal reflexes. No cranial nerve deficit. Coordination normal.  Skin: Skin is warm and dry. Rash noted. There is erythema. No pallor.  Redness in sacrolcoccogeal and buttcoks  Psychiatric: He has a normal mood and affect. His behavior is normal. Thought content normal.    Labs reviewed:  Recent Labs  03/16/16 1201 06/22/16 1057 06/23/16 0430  NA 139 138 138  K 4.0 3.9 3.7  CL 106 105 106  CO2 25 26 25   GLUCOSE 164* 147* 151*  BUN 27* 17 23*  CREATININE 1.00 0.98 1.11  CALCIUM 9.2 9.3 8.4*    Recent Labs  03/16/16 1201 06/23/16 0430  AST 22 18  ALT 27 23  ALKPHOS 77 66  BILITOT 0.9 0.7  PROT 7.0 5.8*  ALBUMIN 4.0 3.2*    Recent Labs  03/16/16 1201 06/22/16 1057 06/23/16 0430  WBC 13.2* 18.7* 10.3  NEUTROABS  --  16.0*  --   HGB 15.2 15.0 12.3*  HCT 44.2 43.9 36.3*  MCV 97.6 96.9 98.1  PLT 124* 130* 113*   No results found for: TSH No results found for: HGBA1C No results found for: CHOL, HDL, LDLCALC, LDLDIRECT, TRIG, CHOLHDL  Significant Diagnostic Results in last 30 days:  Dg Chest 2 View  Result Date: 06/22/2016 CLINICAL DATA:  Pain following fall EXAM: CHEST  2 VIEW COMPARISON:  None. FINDINGS: There is no edema or consolidation. Heart size and pulmonary vascularity are normal. No adenopathy. There is degenerative change in the thoracic spine. IMPRESSION: No edema or consolidation. Electronically Signed   By: Lowella Grip III M.D.   On: 06/22/2016 11:50   Dg Cervical Spine Complete  Result Date: 06/22/2016 CLINICAL DATA:  Fall. EXAM: CERVICAL SPINE - COMPLETE 4+ VIEW COMPARISON:  PET-CT 06/18/2012.  MRI 06/01/2012 . FINDINGS: Diffuse osteopenia and multilevel severe degenerative change. 3 mm anterolisthesis C3-C4. 5 mm anterolisthesis C4-C5. No evidence of  fracture or dislocation. Pulmonary apices are clear. IMPRESSION: Diffuse osteopenia and multilevel degenerative change. 3 mm anterolisthesis C3-C4. 5 mm anterolisthesis C4-C5. No evidence of fracture or dislocation. Electronically Signed   By: Marcello Moores  Register   On: 06/22/2016 10:31  Dg Hip Unilat W Or Wo Pelvis 2-3 Views Left  Result Date: 06/22/2016 CLINICAL DATA:  Status post fall, left hip pain. EXAM: DG HIP (WITH OR WITHOUT PELVIS) 2-3V LEFT COMPARISON:  None. FINDINGS: Bilateral total hip arthroplasties. Nondisplaced fracture isolated to the left greater trochanter extending to the femoral stem component of the arthroplasty. No other fracture or dislocation.  Generalized osteopenia. IMPRESSION: Nondisplaced fracture isolated to the left greater trochanter extending to the femoral stem component of the arthroplasty. Electronically Signed   By: Kathreen Devoid   On: 06/22/2016 10:25    Assessment/Plan Rash Redness in sacrolcoccogeal and buttocks, apply Nystatin powder bid to affected areas until healed.    Dementia Update MMSE, continue Aricept and Namenda.   Diabetes ([place]) Continue Glucophage, update Hgb a1c, TSH, CBC, CMP  Urinary frequency Brief, continue Myrbetriq  Hip fracture (HCC) Managing pain, PT, WBAT     Family/ staff Communication: SNF  Labs/tests ordered:  CBC CMP TSH Hgb a1c MMSE

## 2016-06-25 NOTE — Assessment & Plan Note (Signed)
Brief, continue Myrbetriq

## 2016-06-25 NOTE — Assessment & Plan Note (Signed)
Continue Glucophage, update Hgb a1c, TSH, CBC, CMP

## 2016-06-25 NOTE — Assessment & Plan Note (Signed)
Update MMSE, continue Aricept and Namenda.

## 2016-06-25 NOTE — Assessment & Plan Note (Signed)
Redness in sacrolcoccogeal and buttocks, apply Nystatin powder bid to affected areas until healed.

## 2016-06-25 NOTE — Assessment & Plan Note (Signed)
Managing pain, PT, WBAT

## 2016-06-26 LAB — CBC AND DIFFERENTIAL
HEMATOCRIT: 33 % — AB (ref 41–53)
HEMOGLOBIN: 11.3 g/dL — AB (ref 13.5–17.5)
Platelets: 117 10*3/uL — AB (ref 150–399)
WBC: 9.4 10*3/mL

## 2016-06-26 LAB — HEPATIC FUNCTION PANEL
ALT: 20 U/L (ref 10–40)
AST: 17 U/L (ref 14–40)
Alkaline Phosphatase: 59 U/L (ref 25–125)
BILIRUBIN, TOTAL: 0.4 mg/dL

## 2016-06-26 LAB — BASIC METABOLIC PANEL
BUN: 19 mg/dL (ref 4–21)
Creatinine: 0.9 mg/dL (ref <=1.3)
Glucose: 83 mg/dL
Potassium: 3.7 mmol/L (ref 3.4–5.3)
Sodium: 139 mmol/L (ref 137–147)

## 2016-06-26 LAB — HEMOGLOBIN A1C: HEMOGLOBIN A1C: 6.1

## 2016-06-26 LAB — TSH: TSH: 2.4 u[IU]/mL (ref <=5.90)

## 2016-06-28 ENCOUNTER — Encounter: Payer: Self-pay | Admitting: Internal Medicine

## 2016-06-28 ENCOUNTER — Other Ambulatory Visit: Payer: Self-pay | Admitting: *Deleted

## 2016-06-28 ENCOUNTER — Non-Acute Institutional Stay (SKILLED_NURSING_FACILITY): Payer: Medicare Other | Admitting: Internal Medicine

## 2016-06-28 DIAGNOSIS — F0280 Dementia in other diseases classified elsewhere without behavioral disturbance: Secondary | ICD-10-CM

## 2016-06-28 DIAGNOSIS — G309 Alzheimer's disease, unspecified: Secondary | ICD-10-CM

## 2016-06-28 DIAGNOSIS — Z96643 Presence of artificial hip joint, bilateral: Secondary | ICD-10-CM

## 2016-06-28 DIAGNOSIS — E118 Type 2 diabetes mellitus with unspecified complications: Secondary | ICD-10-CM | POA: Diagnosis not present

## 2016-06-28 DIAGNOSIS — D696 Thrombocytopenia, unspecified: Secondary | ICD-10-CM | POA: Diagnosis not present

## 2016-06-28 DIAGNOSIS — S72002D Fracture of unspecified part of neck of left femur, subsequent encounter for closed fracture with routine healing: Secondary | ICD-10-CM | POA: Diagnosis not present

## 2016-06-28 NOTE — Progress Notes (Signed)
History and Physical     Location:  [facility] Room Number: N51 Place of Service:  SNF (31)  PCP: Jeanmarie Hubert, MD Patient Care Team: Estill Dooms, MD as PCP - General (Internal Medicine) Man Otho Darner, NP as Nurse Practitioner (Internal Medicine)  Extended Emergency Contact Information Primary Emergency Contact: [name] Address: [place], [place] Phone: [phone] Mobile Phone: [phone] Relation: Spouse Secondary Emergency Contact: [name] States of [place] Phone: [phone] Work Phone: [phone] Relation: Daughter  Code Status: Full Code Goals of Care: Advanced Directive information Advanced Directives 06/28/2016  Does Patient Have a Medical Advance Directive? No  Type of Advance Directive -  Does patient want to make changes to medical advance directive? -  Would patient like information on creating a medical advance directive? -  Pre-existing out of facility DNR order (yellow form or pink MOST form) -      Chief Complaint  Patient presents with  . New Admit To SNF    following hospitalization 06/22/2016 to 06/23/16 nondisplaced fracture left greater trochanter, after a fall.  Dr. Alvan Dame    HPI: Patient is a 83 y.o. male seen today for admission to [facility] on 06/23/16 following hospitalization from 06/22/16 to 06/23/16. He had a fall in his home and sustained a nondisplaced fracture of the left greater trochanter extending to the femoral stem component of a prior arthroplasty. Ortho consult recommended pain control, WBAT and f/u with Dr Alvan Dame in 4 wks.  Other problems include Dementia treated with Aricept and Namenda, depression and anxiety, DM, HLD, and thrombocytopenia. These issues are stable.  His PCP is Dr. Joylene Draft.  Past Medical History:  Diagnosis Date  . Abnormality of gait   . Backache, unspecified   . Colon polyps    adenomatous  . Dementia  11/12/2012  . Diverticulosis   . Essential and other specified forms of tremor   . Hemorrhoids   . History of bilateral hip replacements 11/12/2012  . Memory loss   . 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 Pain in joint, pelvic region and thigh   . Skin cancer of scalp   . Spinal stenosis, lumbar region, without neurogenic claudication   . Unspecified hereditary and idiopathic peripheral neuropathy    Past Surgical History:  Procedure Laterality Date  . CATARACT EXTRACTION, BILATERAL  2012  . RETINAL LASER PROCEDURE  2012   retinal wrinkle  . SKIN CANCER EXCISION    . TOTAL HIP ARTHROPLASTY Left 2000  . TOTAL HIP ARTHROPLASTY (aka REPLACEMENT) Right 2011    reports that he has quit smoking. His smoking use included Cigarettes. He has never used smokeless tobacco. He reports that he drinks about 8.4 oz of alcohol per week . He reports that he does not use drugs. Social History   Social History  . Marital status: Married    Spouse name: [name]  . Number of children: 2  . Years of education: lawyer   Occupational History  . Retired [employer]    retired   Social History Main Topics  . Smoking status: Former Smoker    Types: Cigarettes  . Smokeless tobacco: Never Used  . Alcohol use 8.4 oz/week    14 Glasses of wine per week     Comment: occas, one glass of wine before dinner,sometimes 1/2 glass  . Drug use:  No  . Sexual activity: No   Other Topics Concern  . Not on file   Social History Narrative   Patient is right handed and resides in home with wife    Family History  Problem Relation Age of Onset  . Heart failure Mother     Health Maintenance  Topic Date Due  . HEMOGLOBIN A1C  [birth year], 1932  . FOOT EXAM  06/06/1942  . OPHTHALMOLOGY EXAM  06/06/1942  . URINE MICROALBUMIN  06/06/1942  . TETANUS/TDAP  06/06/1951  . PNA vac Low Risk Adult (1 of 2 - PCV13) 06/05/1997  . INFLUENZA VACCINE  11/01/2015    Allergies    Allergen Reactions  . Axona [Flora-Q]     Pt reported  . Gabapentin     Pt reported    Allergies as of 06/28/2016      Reactions   Axona [flora-q]    Pt reported   Gabapentin    Pt reported      Medication List       Accurate as of 06/28/16 10:05 AM. Always use your most recent med list.          acetaminophen 325 MG tablet Commonly known as:  TYLENOL Take 2 tablets (650 mg total) by mouth every 6 (six) hours as needed for mild pain (or Fever >/= 101).   ALIGN 4 MG Caps Take 4 mg by mouth daily.   buPROPion 150 MG 12 hr tablet Commonly known as:  WELLBUTRIN SR Take 150 mg by mouth daily.   DELSYM 30 MG/5ML liquid Generic drug:  dextromethorphan Take 30 mg by mouth at bedtime as needed for cough.   donepezil 23 MG Tabs tablet Commonly known as:  ARICEPT Take 1 tablet (23 mg total) by mouth daily.   dorzolamide-timolol 22.3-6.8 MG/ML ophthalmic solution Commonly known as:  COSOPT Place 1 drop into the left eye 2 (two) times daily.   Flaxseed Oil 1000 MG Caps Take 1,000 mg by mouth daily.   folic acid 811 MCG tablet Commonly known as:  FOLVITE Take 800 mcg by mouth every evening.   hydrocortisone 25 MG suppository Commonly known as:  ANUSOL-HC Place 1 suppository (25 mg total) rectally at bedtime.   ibuprofen 400 MG tablet Commonly known as:  ADVIL,MOTRIN Take 1 tablet (400 mg total) by mouth 4 (four) times daily. For 3 days. Then Every 4 hrs PRN moderate pain.   loperamide 2 MG tablet Commonly known as:  IMODIUM A-D Take 2-4 mg by mouth 4 (four) times daily as needed for diarrhea or loose stools.   memantine 28 MG Cp24 24 hr capsule Commonly known as:  NAMENDA XR Take 1 capsule (28 mg total) by mouth daily.   metFORMIN 500 MG tablet Commonly known as:  GLUCOPHAGE Take 500 mg by mouth daily with breakfast.   multivitamin tablet Take 1 tablet by mouth daily.   MYRBETRIQ 50 MG Tb24 tablet Generic drug:  mirabegron ER Take 50 mg by mouth  daily.   nystatin powder Generic drug:  nystatin Apply topically. Apply to right and left buttocks twice a day   potassium chloride 10 MEQ CR tablet Commonly known as:  KLOR-CON Take 10 mEq by mouth 2 (two) times a week.   simvastatin 40 MG tablet Commonly known as:  ZOCOR Take 40 mg by mouth every evening.   traMADol 50 MG tablet Commonly known as:  ULTRAM Take 1 tablet (50 mg total) by mouth every 6 (six) hours as needed for severe pain.  vitamin C 1000 MG tablet Take 1,000 mg by mouth daily.   Vitamin D 1000 units capsule Take 1,000 Units by mouth daily.       Review of Systems  Constitutional: Positive for activity change. Negative for fatigue and unexpected weight change.  HENT: Positive for hearing loss. Negative for congestion, drooling, ear pain and mouth sores.   Eyes: Negative for pain, discharge, redness, itching and visual disturbance.  Respiratory: Negative for cough, chest tightness and shortness of breath.   Cardiovascular: Negative for chest pain, palpitations and leg swelling.  Gastrointestinal: Negative for abdominal distention, abdominal pain and constipation.  Endocrine: Negative for cold intolerance, heat intolerance, polydipsia and polyphagia.  Genitourinary: Positive for frequency. Negative for dysuria and urgency.  Musculoskeletal: Positive for arthralgias and gait problem. Negative for joint swelling.       Left hip fx  Skin: Positive for rash. Negative for color change.       buttocks  Allergic/Immunologic: Negative for food allergies.  Neurological: Negative for dizziness, syncope, facial asymmetry, speech difficulty, numbness and headaches.       Dementia  Hematological: Negative for adenopathy. Does not bruise/bleed easily.       Chronic thrombocytopenia.  Psychiatric/Behavioral: Positive for confusion, decreased concentration and dysphoric mood (hx of depression and anxiety.). Negative for agitation, behavioral problems, hallucinations and  sleep disturbance. The patient is not nervous/anxious.     Vitals:   06/28/16 0951  BP: 136/72  Pulse: 80  Resp: 20  Temp: 98 F (36.7 C)  SpO2: 97%  Weight: 170 lb (77.1 kg)  Height: 5\' 9"  (1.753 m)   Body mass index is 25.1 kg/m. Physical Exam  Constitutional: He appears well-developed and well-nourished.  HENT:  Head: Normocephalic and atraumatic.  Eyes: Conjunctivae and EOM are normal. Pupils are equal, round, and reactive to light.  Neck: Normal range of motion. Neck supple. No thyromegaly present.  Cardiovascular: Normal rate, regular rhythm and normal heart sounds.   No murmur heard. Pulmonary/Chest: Effort normal and breath sounds normal. No respiratory distress. He has no wheezes.  Abdominal: Soft. Bowel sounds are normal. He exhibits no distension. There is no tenderness.  Musculoskeletal: He exhibits tenderness. He exhibits no edema.  Bilateral hip replacements, left hip fx, WBAT, f/u Ortho  Neurological: He is alert. He has normal reflexes. No cranial nerve deficit. Coordination normal.  Skin: Skin is warm and dry. Rash noted. There is erythema. No pallor.  Redness in sacrolcoccogeal and buttcoks  Psychiatric: He has a normal mood and affect. His behavior is normal. Thought content normal.    Labs reviewed: Basic Metabolic Panel:  Recent Labs  03/16/16 1201 06/22/16 1057 06/23/16 0430  NA 139 138 138  K 4.0 3.9 3.7  CL 106 105 106  CO2 25 26 25   GLUCOSE 164* 147* 151*  BUN 27* 17 23*  CREATININE 1.00 0.98 1.11  CALCIUM 9.2 9.3 8.4*   Liver Function Tests:  Recent Labs  03/16/16 1201 06/23/16 0430  AST 22 18  ALT 27 23  ALKPHOS 77 66  BILITOT 0.9 0.7  PROT 7.0 5.8*  ALBUMIN 4.0 3.2*    Recent Labs  03/16/16 1201  LIPASE 25   No results for input(s): AMMONIA in the last 8760 hours. CBC:  Recent Labs  03/16/16 1201 06/22/16 1057 06/23/16 0430  WBC 13.2* 18.7* 10.3  NEUTROABS  --  16.0*  --   HGB 15.2 15.0 12.3*  HCT 44.2 43.9  36.3*  MCV 97.6 96.9 98.1  PLT  124* 130* 113*   Cardiac Enzymes: No results for input(s): CKTOTAL, CKMB, CKMBINDEX, TROPONINI in the last 8760 hours. BNP: Invalid input(s): POCBNP No results found for: HGBA1C No results found for: TSH No results found for: VITAMINB12 No results found for: FOLATE No results found for: IRON, TIBC, FERRITIN  Imaging and Procedures obtained prior to SNF admission: Dg Chest 2 View  Result Date: 06/22/2016 CLINICAL DATA:  Pain following fall EXAM: CHEST  2 VIEW COMPARISON:  None. FINDINGS: There is no edema or consolidation. Heart size and pulmonary vascularity are normal. No adenopathy. There is degenerative change in the thoracic spine. IMPRESSION: No edema or consolidation. Electronically Signed   By: Lowella Grip III M.D.   On: 06/22/2016 11:50   Dg Cervical Spine Complete  Result Date: 06/22/2016 CLINICAL DATA:  Fall. EXAM: CERVICAL SPINE - COMPLETE 4+ VIEW COMPARISON:  PET-CT 06/18/2012.  MRI 06/01/2012 . FINDINGS: Diffuse osteopenia and multilevel severe degenerative change. 3 mm anterolisthesis C3-C4. 5 mm anterolisthesis C4-C5. No evidence of fracture or dislocation. Pulmonary apices are clear. IMPRESSION: Diffuse osteopenia and multilevel degenerative change. 3 mm anterolisthesis C3-C4. 5 mm anterolisthesis C4-C5. No evidence of fracture or dislocation. Electronically Signed   By: Marcello Moores  Register   On: 06/22/2016 10:31   Dg Hip Unilat W Or Wo Pelvis 2-3 Views Left  Result Date: 06/22/2016 CLINICAL DATA:  Status post fall, left hip pain. EXAM: DG HIP (WITH OR WITHOUT PELVIS) 2-3V LEFT COMPARISON:  None. FINDINGS: Bilateral total hip arthroplasties. Nondisplaced fracture isolated to the left greater trochanter extending to the femoral stem component of the arthroplasty. No other fracture or dislocation.  Generalized osteopenia. IMPRESSION: Nondisplaced fracture isolated to the left greater trochanter extending to the femoral stem component of the  arthroplasty. Electronically Signed   By: Kathreen Devoid   On: 06/22/2016 10:25    Assessment/Plan 1. Closed fracture of left hip with routine healing, subsequent encounter -PT, OT for strengthening, safe mobility, and improved self care skills.  2. History of bilateral hip replacements  3. Type 2 diabetes mellitus with complication, without long-term current use of insulin (HCC) The current medical regimen is effective;  continue present plan and medications.  4. Alzheimer's dementia without behavioral disturbance, unspecified timing of dementia onset continue Aricept and Nasmenda  5. Thrombocytopenia (HCC) Last plt ct 113,000. Monitor lab.

## 2016-07-03 ENCOUNTER — Ambulatory Visit: Payer: Medicare Other | Admitting: Sports Medicine

## 2016-07-09 ENCOUNTER — Encounter: Payer: Self-pay | Admitting: Nurse Practitioner

## 2016-07-09 ENCOUNTER — Non-Acute Institutional Stay (SKILLED_NURSING_FACILITY): Payer: Medicare Other | Admitting: Nurse Practitioner

## 2016-07-09 DIAGNOSIS — S72002D Fracture of unspecified part of neck of left femur, subsequent encounter for closed fracture with routine healing: Secondary | ICD-10-CM | POA: Diagnosis not present

## 2016-07-09 DIAGNOSIS — F418 Other specified anxiety disorders: Secondary | ICD-10-CM | POA: Diagnosis not present

## 2016-07-09 DIAGNOSIS — L89312 Pressure ulcer of right buttock, stage 2: Secondary | ICD-10-CM

## 2016-07-09 DIAGNOSIS — L89302 Pressure ulcer of unspecified buttock, stage 2: Secondary | ICD-10-CM | POA: Insufficient documentation

## 2016-07-09 DIAGNOSIS — G309 Alzheimer's disease, unspecified: Secondary | ICD-10-CM

## 2016-07-09 DIAGNOSIS — E118 Type 2 diabetes mellitus with unspecified complications: Secondary | ICD-10-CM | POA: Diagnosis not present

## 2016-07-09 DIAGNOSIS — F0280 Dementia in other diseases classified elsewhere without behavioral disturbance: Secondary | ICD-10-CM

## 2016-07-09 NOTE — Assessment & Plan Note (Signed)
Continue Glucophage, 06/27/16 Hgb a1c 6.1

## 2016-07-09 NOTE — Assessment & Plan Note (Signed)
Mood is stable, continue Wellbutrin.

## 2016-07-09 NOTE — Assessment & Plan Note (Signed)
continue Aricept and Namenda. 06/28/16 18/30

## 2016-07-09 NOTE — Assessment & Plan Note (Addendum)
07/09/16 Ortho, touch down weight bearing. Continue Motrin qid, prn Tylenol and Tramadol.

## 2016-07-09 NOTE — Assessment & Plan Note (Signed)
The right buttock, superficial stage 2, keep the wound dry and clean, off pressure when possible, apply barrier ointment bid, observe.

## 2016-07-09 NOTE — Progress Notes (Signed)
Location:  [facility] Room Number: 65 Place of Service:  SNF (31) Provider:  Felix Pratt, Manxie  NP   Jeanmarie Hubert, MD  Patient Care Team: Estill Dooms, MD as PCP - General (Internal Medicine) Drey Shaff Otho Darner, NP as Nurse Practitioner (Internal Medicine)  Extended Emergency Contact Information Primary Emergency Contact: [name] Address: [place], [place] Phone: [phone] Mobile Phone: [phone] Relation: Spouse Secondary Emergency Contact: [name] [place] Phone: [phone] Work Phone: [phone] Relation: Daughter  Code Status: Full Code  Goals of care: Advanced Directive information Advanced Directives 07/09/2016  Does Patient Have a Medical Advance Directive? No  Type of Advance Directive Out of facility DNR (pink MOST or yellow form)  Does patient want to make changes to medical advance directive? No - Patient declined  Would patient like information on creating a medical advance directive? -  Pre-existing out of facility DNR order (yellow form or pink MOST form) -     Chief Complaint  Patient presents with  . Acute Visit    open area (gluteal fold) R buttock    HPI:  Pt is a 83 y.o. male seen today for an acute visit for R buttock small open area, no s/s of infection, previous excoriated area, moist and pressure contributory.     06/22/16 to 06/23/16 left Hip x-ray in the ED showed a nondisplaced fracture isolated to the left greater trochanter extending to the femoral stem component of the arthroplasty, Hx of bilateral hip replacements             Hx of dementia, taking Aricept 25mg  Namenda 28mg  daily. Depression/anxiety, takign Wellbutrin 150mg  bid. T2DM, taking Glucophage. Urinary frequency, taking Myrbetriq    Past Medical History:  Diagnosis Date  . Abnormality of gait   . Backache, unspecified   . Colon polyps    adenomatous  . Dementia  11/12/2012  . Diverticulosis   . Essential and other specified forms of tremor   . Hemorrhoids   . History of bilateral hip replacements 11/12/2012  . Memory loss   . 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 Pain in joint, pelvic region and thigh   . Skin cancer of scalp   . Spinal stenosis, lumbar region, without neurogenic claudication   . Unspecified hereditary and idiopathic peripheral neuropathy    Past Surgical History:  Procedure Laterality Date  . CATARACT EXTRACTION, BILATERAL  2012  . RETINAL LASER PROCEDURE  2012   retinal wrinkle  . SKIN CANCER EXCISION    . TOTAL HIP ARTHROPLASTY Left 2000  . TOTAL HIP ARTHROPLASTY (aka REPLACEMENT) Right 2011    Allergies  Allergen Reactions  . Axona [Flora-Q]     Pt reported  . Gabapentin     Pt reported    Allergies as of 07/09/2016      Reactions   Axona [flora-q]    Pt reported   Gabapentin    Pt reported      Medication List       Accurate as of 07/09/16  3:12 PM. Always use your most recent med list.          acetaminophen 325 MG tablet Commonly known as:  TYLENOL Take 2 tablets (650 mg total) by mouth every 6 (six) hours as needed for mild pain (or Fever >/= 101).   ALIGN 4 MG Caps Take  4 mg by mouth daily.   buPROPion 150 MG 12 hr tablet Commonly known as:  WELLBUTRIN SR Take 150 mg by mouth daily.   DELSYM 30 MG/5ML liquid Generic drug:  dextromethorphan Take 30 mg by mouth at bedtime as needed for cough.   donepezil 23 MG Tabs tablet Commonly known as:  ARICEPT Take 1 tablet (23 mg total) by mouth daily.   dorzolamide-timolol 22.3-6.8 MG/ML ophthalmic solution Commonly known as:  COSOPT Place 1 drop into the left eye 2 (two) times daily.   Flaxseed Oil 1000 MG Caps Take 1,000 mg by mouth daily.   folic acid 774 MCG tablet Commonly known as:  FOLVITE Take 800 mcg by mouth every evening.   hydrocortisone 25 MG suppository Commonly known as:   ANUSOL-HC Place 1 suppository (25 mg total) rectally at bedtime.   ibuprofen 400 MG tablet Commonly known as:  ADVIL,MOTRIN Take 1 tablet (400 mg total) by mouth 4 (four) times daily. For 3 days. Then Every 4 hrs PRN moderate pain.   loperamide 2 MG tablet Commonly known as:  IMODIUM A-D Take 2-4 mg by mouth 4 (four) times daily as needed for diarrhea or loose stools.   memantine 28 MG Cp24 24 hr capsule Commonly known as:  NAMENDA XR Take 1 capsule (28 mg total) by mouth daily.   metFORMIN 500 MG tablet Commonly known as:  GLUCOPHAGE Take 500 mg by mouth daily with breakfast.   multivitamin tablet Take 1 tablet by mouth daily.   MYRBETRIQ 50 MG Tb24 tablet Generic drug:  mirabegron ER Take 50 mg by mouth daily.   nystatin powder Generic drug:  nystatin Apply topically. Apply to right and left buttocks twice a day   potassium chloride 10 MEQ CR tablet Commonly known as:  KLOR-CON Take 10 mEq by mouth 2 (two) times a week.   simvastatin 40 MG tablet Commonly known as:  ZOCOR Take 40 mg by mouth every evening.   traMADol 50 MG tablet Commonly known as:  ULTRAM Take 1 tablet (50 mg total) by mouth every 6 (six) hours as needed for severe pain.   vitamin C 1000 MG tablet Take 1,000 mg by mouth daily.   Vitamin D 1000 units capsule Take 1,000 Units by mouth daily.       Review of Systems  Constitutional: Positive for activity change. Negative for fatigue and unexpected weight change.  HENT: Positive for hearing loss. Negative for congestion, drooling, ear pain and mouth sores.   Eyes: Negative for pain, discharge, redness, itching and visual disturbance.  Respiratory: Negative for cough, chest tightness and shortness of breath.   Cardiovascular: Negative for chest pain, palpitations and leg swelling.  Gastrointestinal: Negative for abdominal distention, abdominal pain and constipation.  Endocrine: Negative for cold intolerance, heat intolerance, polydipsia and  polyphagia.  Genitourinary: Positive for frequency. Negative for dysuria and urgency.  Musculoskeletal: Positive for arthralgias and gait problem. Negative for joint swelling.       Left hip fx, 07/09/16 Ortho touch down   Skin: Positive for rash. Negative for color change.       R buttock open area.   Allergic/Immunologic: Negative for food allergies.  Neurological: Negative for dizziness, syncope, facial asymmetry, speech difficulty, numbness and headaches.       Dementia  Hematological: Negative for adenopathy. Does not bruise/bleed easily.       Chronic thrombocytopenia.  Psychiatric/Behavioral: Positive for confusion, decreased concentration and dysphoric mood (hx of depression and anxiety.). Negative for agitation, behavioral  problems, hallucinations and sleep disturbance. The patient is not nervous/anxious.     Immunization History  Administered Date(s) Administered  . PPD Test 06/24/2016   Pertinent  Health Maintenance Due  Topic Date Due  . FOOT EXAM  06/06/1940  . OPHTHALMOLOGY EXAM  06/06/1940  . URINE MICROALBUMIN  06/06/1940  . PNA vac Low Risk Adult (1 of 2 - PCV13) 06/05/1997  . INFLUENZA VACCINE  10/31/2016  . HEMOGLOBIN A1C  12/27/2016   Fall Risk  01/19/2016 07/21/2015  Falls in the past year? Yes Yes  Number falls in past yr: 1 1  Injury with Fall? No No  Risk for fall due to : Impaired balance/gait -  Follow up Falls prevention discussed Falls prevention discussed   Functional Status Survey:    Vitals:   07/09/16 1117  BP: 134/72  Pulse: 88  Resp: 16  Temp: 97.8 F (36.6 C)  Weight: 174 lb 8 oz (79.2 kg)  Height: 5\' 9"  (1.753 m)   Body mass index is 25.77 kg/m. Physical Exam  Constitutional: He appears well-developed and well-nourished.  HENT:  Head: Normocephalic and atraumatic.  Eyes: Conjunctivae and EOM are normal. Pupils are equal, round, and reactive to light.  Neck: Normal range of motion. Neck supple. No thyromegaly present.   Cardiovascular: Normal rate, regular rhythm and normal heart sounds.   No murmur heard. Pulmonary/Chest: Effort normal and breath sounds normal. No respiratory distress. He has no wheezes.  Abdominal: Soft. Bowel sounds are normal. He exhibits no distension. There is no tenderness.  Musculoskeletal: He exhibits tenderness. He exhibits no edema.  Bilateral hip replacements, left hip fx, WBAT, f/u Ortho, 07/09/16 touch down weigh bearing  Neurological: He is alert. He has normal reflexes. No cranial nerve deficit. Coordination normal.  Skin: Skin is warm and dry. Rash noted. There is erythema. No pallor.  Redness in sacrolcoccogeal and buttocks, small open area R buttock, no s/s of infection.   Psychiatric: He has a normal mood and affect. His behavior is normal. Thought content normal.    Labs reviewed:  Recent Labs  03/16/16 1201 06/22/16 1057 06/23/16 0430 06/26/16  NA 139 138 138 139  K 4.0 3.9 3.7 3.7  CL 106 105 106  --   CO2 25 26 25   --   GLUCOSE 164* 147* 151*  --   BUN 27* 17 23* 19  CREATININE 1.00 0.98 1.11 0.9  CALCIUM 9.2 9.3 8.4*  --     Recent Labs  03/16/16 1201 06/23/16 0430 06/26/16  AST 22 18 17   ALT 27 23 20   ALKPHOS 77 66 59  BILITOT 0.9 0.7  --   PROT 7.0 5.8*  --   ALBUMIN 4.0 3.2*  --     Recent Labs  03/16/16 1201 06/22/16 1057 06/23/16 0430 06/26/16  WBC 13.2* 18.7* 10.3 9.4  NEUTROABS  --  16.0*  --   --   HGB 15.2 15.0 12.3* 11.3*  HCT 44.2 43.9 36.3* 33*  MCV 97.6 96.9 98.1  --   PLT 124* 130* 113* 117*   Lab Results  Component Value Date   TSH 2.40 06/26/2016   Lab Results  Component Value Date   HGBA1C 6.1 06/26/2016   No results found for: CHOL, HDL, LDLCALC, LDLDIRECT, TRIG, CHOLHDL  Significant Diagnostic Results in last 30 days:  Dg Chest 2 View  Result Date: 06/22/2016 CLINICAL DATA:  Pain following fall EXAM: CHEST  2 VIEW COMPARISON:  None. FINDINGS: There is no edema or  consolidation. Heart size and pulmonary  vascularity are normal. No adenopathy. There is degenerative change in the thoracic spine. IMPRESSION: No edema or consolidation. Electronically Signed   By: Lowella Grip III M.D.   On: 06/22/2016 11:50   Dg Cervical Spine Complete  Result Date: 06/22/2016 CLINICAL DATA:  Fall. EXAM: CERVICAL SPINE - COMPLETE 4+ VIEW COMPARISON:  PET-CT 06/18/2012.  MRI 06/01/2012 . FINDINGS: Diffuse osteopenia and multilevel severe degenerative change. 3 mm anterolisthesis C3-C4. 5 mm anterolisthesis C4-C5. No evidence of fracture or dislocation. Pulmonary apices are clear. IMPRESSION: Diffuse osteopenia and multilevel degenerative change. 3 mm anterolisthesis C3-C4. 5 mm anterolisthesis C4-C5. No evidence of fracture or dislocation. Electronically Signed   By: Marcello Moores  Register   On: 06/22/2016 10:31   Dg Hip Unilat W Or Wo Pelvis 2-3 Views Left  Result Date: 06/22/2016 CLINICAL DATA:  Status post fall, left hip pain. EXAM: DG HIP (WITH OR WITHOUT PELVIS) 2-3V LEFT COMPARISON:  None. FINDINGS: Bilateral total hip arthroplasties. Nondisplaced fracture isolated to the left greater trochanter extending to the femoral stem component of the arthroplasty. No other fracture or dislocation.  Generalized osteopenia. IMPRESSION: Nondisplaced fracture isolated to the left greater trochanter extending to the femoral stem component of the arthroplasty. Electronically Signed   By: Kathreen Devoid   On: 06/22/2016 10:25    Assessment/Plan Decubitus ulcer of buttock, stage 2 The right buttock, superficial stage 2, keep the wound dry and clean, off pressure when possible, apply barrier ointment bid, observe.   Diabetes ([place]) Continue Glucophage, 06/27/16 Hgb a1c 6.1  Dementia continue Aricept and Namenda. 06/28/16 18/30  Hip fracture ([place]) 07/09/16 Ortho, touch down weight bearing. Continue Motrin qid, prn Tylenol and Tramadol.   Depression with anxiety Mood is stable, continue Wellbutrin.      Family/ staff  Communication: SNF  Labs/tests ordered:  none

## 2016-07-19 ENCOUNTER — Ambulatory Visit: Payer: Medicare Other | Admitting: Neurology

## 2016-07-24 ENCOUNTER — Ambulatory Visit: Payer: Medicare Other | Admitting: Sports Medicine

## 2016-07-26 ENCOUNTER — Encounter: Payer: Self-pay | Admitting: Nurse Practitioner

## 2016-07-26 ENCOUNTER — Non-Acute Institutional Stay (SKILLED_NURSING_FACILITY): Payer: Medicare Other | Admitting: Nurse Practitioner

## 2016-07-26 DIAGNOSIS — Z96643 Presence of artificial hip joint, bilateral: Secondary | ICD-10-CM

## 2016-07-26 DIAGNOSIS — K644 Residual hemorrhoidal skin tags: Secondary | ICD-10-CM | POA: Diagnosis not present

## 2016-07-26 DIAGNOSIS — D72829 Elevated white blood cell count, unspecified: Secondary | ICD-10-CM | POA: Diagnosis not present

## 2016-07-26 DIAGNOSIS — F0280 Dementia in other diseases classified elsewhere without behavioral disturbance: Secondary | ICD-10-CM | POA: Diagnosis not present

## 2016-07-26 DIAGNOSIS — S72002D Fracture of unspecified part of neck of left femur, subsequent encounter for closed fracture with routine healing: Secondary | ICD-10-CM

## 2016-07-26 DIAGNOSIS — F418 Other specified anxiety disorders: Secondary | ICD-10-CM

## 2016-07-26 DIAGNOSIS — R350 Frequency of micturition: Secondary | ICD-10-CM | POA: Diagnosis not present

## 2016-07-26 DIAGNOSIS — E118 Type 2 diabetes mellitus with unspecified complications: Secondary | ICD-10-CM

## 2016-07-26 DIAGNOSIS — G309 Alzheimer's disease, unspecified: Secondary | ICD-10-CM

## 2016-07-26 NOTE — Assessment & Plan Note (Signed)
Brief, continue Myrbetriq

## 2016-07-26 NOTE — Assessment & Plan Note (Addendum)
Stable, 2x,  continue topical steroid agent as needed, avoid constipation. Observe .

## 2016-07-26 NOTE — Assessment & Plan Note (Signed)
Mood is stable, continue Wellbutrin.

## 2016-07-26 NOTE — Assessment & Plan Note (Signed)
Continue therapy

## 2016-07-26 NOTE — Assessment & Plan Note (Signed)
continue Aricept and Namenda. 06/28/16 18/30, continue SNF for care assistance.

## 2016-07-26 NOTE — Assessment & Plan Note (Signed)
Continue Glucophage, 06/27/16 Hgb a1c 6.1

## 2016-07-26 NOTE — Progress Notes (Signed)
Location:  [facility] Room Number: 76 Place of Service:  SNF (31) Provider:  Dwight Burdo, Manxie  NP  Jeanmarie Hubert, MD  Patient Care Team: Estill Dooms, MD as PCP - General (Internal Medicine) Korey Prashad Otho Darner, NP as Nurse Practitioner (Internal Medicine)  Extended Emergency Contact Information Primary Emergency Contact: [name] Address: [place], [place] Phone: [phone] Mobile Phone: [phone] Relation: Spouse Secondary Emergency Contact: [name] [place] Phone: [phone] Work Phone: [phone] Relation: Daughter  Code Status:  DNR Goals of care: Advanced Directive information Advanced Directives 07/26/2016  Does Patient Have a Medical Advance Directive? No  Type of Advance Directive Out of facility DNR (pink MOST or yellow form)  Does patient want to make changes to medical advance directive? No - Patient declined  Would patient like information on creating a medical advance directive? -  Pre-existing out of facility DNR order (yellow form or pink MOST form) Yellow form placed in chart (order not valid for inpatient use)     Chief Complaint  Patient presents with  . Acute Visit    Sm. hemmorhoid seen,     HPI:  Pt is a 83 y.o. male seen today for an acute visit for external hemorrhoid, no bleeding or inflammation, chronic topical steroid use.      06/22/16 to 06/23/16 left Hip x-ray in the ED showed a nondisplaced fracture isolated to the left greater trochanter extending to the femoral stem component of the arthroplasty, Hx of bilateral hip replacements Hx of dementia, taking Aricept 25mg  Namenda 28mg  daily. Depression/anxiety, takign Wellbutrin 150mg  bid. T2DM, taking Glucophage, last Hgb a1c 6.1 06/26/16,  Urinary frequency, taking Myrbetriq  Past Medical History:  Diagnosis Date  . Abnormality of gait   . Backache, unspecified   . Colon polyps     adenomatous  . Dementia 11/12/2012  . Diverticulosis   . Essential and other specified forms of tremor   . Hemorrhoids   . History of bilateral hip replacements 11/12/2012  . Memory loss   . 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 Pain in joint, pelvic region and thigh   . Skin cancer of scalp   . Spinal stenosis, lumbar region, without neurogenic claudication   . Unspecified hereditary and idiopathic peripheral neuropathy    Past Surgical History:  Procedure Laterality Date  . CATARACT EXTRACTION, BILATERAL  2012  . RETINAL LASER PROCEDURE  2012   retinal wrinkle  . SKIN CANCER EXCISION    . TOTAL HIP ARTHROPLASTY Left 2000  . TOTAL HIP ARTHROPLASTY (aka REPLACEMENT) Right 2011    Allergies  Allergen Reactions  . Axona [Flora-Q]     Pt reported  . Gabapentin     Pt reported    Outpatient Encounter Prescriptions as of 07/26/2016  Medication Sig  . acetaminophen (TYLENOL) 325 MG tablet Take 2 tablets (650 mg total) by mouth every 6 (six) hours as needed for mild pain (or Fever >/= 101).  Marland Kitchen acetaminophen (TYLENOL) 500 MG tablet Take 1,000 mg by mouth every 8 (eight) hours as needed.  . Ascorbic Acid (VITAMIN C) 1000 MG tablet Take 1,000 mg by mouth daily.    Marland Kitchen buPROPion (WELLBUTRIN SR) 150 MG 12 hr tablet Take 150 mg by mouth daily.   . Cholecalciferol (VITAMIN D) 1000 UNITS capsule Take 1,000 Units by mouth daily.    Marland Kitchen  dextromethorphan (DELSYM) 30 MG/5ML liquid Take 30 mg by mouth at bedtime as needed for cough.  . donepezil (ARICEPT) 23 MG TABS tablet Take 1 tablet (23 mg total) by mouth daily.  . dorzolamide-timolol (COSOPT) 22.3-6.8 MG/ML ophthalmic solution Place 1 drop into the left eye 2 (two) times daily.   . Flaxseed, Linseed, (FLAXSEED OIL) 1000 MG CAPS Take 1,000 mg by mouth daily.    . folic acid (FOLVITE) 856 MCG tablet Take 800 mcg by mouth every evening.   Marland Kitchen ibuprofen (ADVIL,MOTRIN) 400 MG tablet Take 1 tablet (400 mg total)  by mouth 4 (four) times daily. For 3 days. Then Every 4 hrs PRN moderate pain.  Marland Kitchen loperamide (IMODIUM A-D) 2 MG tablet Take 2-4 mg by mouth 4 (four) times daily as needed for diarrhea or loose stools.  . memantine (NAMENDA XR) 28 MG CP24 24 hr capsule Take 1 capsule (28 mg total) by mouth daily.  . metFORMIN (GLUCOPHAGE) 500 MG tablet Take 500 mg by mouth daily with breakfast.   . mirabegron ER (MYRBETRIQ) 50 MG TB24 tablet Take 50 mg by mouth daily.  . Multiple Vitamin (MULTIVITAMIN) tablet Take 1 tablet by mouth daily.    Marland Kitchen nystatin (NYSTATIN) powder Apply topically. Apply to right and left buttocks twice a day  . potassium chloride (KLOR-CON) 10 MEQ CR tablet Take 10 mEq by mouth 2 (two) times a week.    . Probiotic Product (ALIGN) 4 MG CAPS Take 4 mg by mouth daily.  . simvastatin (ZOCOR) 40 MG tablet Take 40 mg by mouth every evening.   . traMADol (ULTRAM) 50 MG tablet Take 1 tablet (50 mg total) by mouth every 6 (six) hours as needed for severe pain.  . [DISCONTINUED] hydrocortisone (ANUSOL-HC) 25 MG suppository Place 1 suppository (25 mg total) rectally at bedtime.   No facility-administered encounter medications on file as of 07/26/2016.     Review of Systems  Constitutional: Positive for activity change. Negative for fatigue and unexpected weight change.  HENT: Positive for hearing loss. Negative for congestion, drooling, ear pain and mouth sores.   Eyes: Negative for pain, discharge, redness, itching and visual disturbance.  Respiratory: Negative for cough, chest tightness and shortness of breath.   Cardiovascular: Negative for chest pain, palpitations and leg swelling.  Gastrointestinal: Negative for abdominal distention, abdominal pain and constipation.       External hemorrhoids, no bleeding or inflammation, prn steroid cream use.   Endocrine: Negative for cold intolerance, heat intolerance, polydipsia and polyphagia.  Genitourinary: Positive for frequency. Negative for dysuria  and urgency.  Musculoskeletal: Positive for arthralgias and gait problem. Negative for joint swelling.       Left hip fx, 07/09/16 Ortho touch down   Skin: Negative for color change and rash.       R buttock open area.   Allergic/Immunologic: Negative for food allergies.  Neurological: Negative for dizziness, syncope, facial asymmetry, speech difficulty, numbness and headaches.       Dementia  Hematological: Negative for adenopathy. Does not bruise/bleed easily.       Chronic thrombocytopenia.  Psychiatric/Behavioral: Positive for confusion, decreased concentration and dysphoric mood (hx of depression and anxiety.). Negative for agitation, behavioral problems, hallucinations and sleep disturbance. The patient is not nervous/anxious.     Immunization History  Administered Date(s) Administered  . PPD Test 06/24/2016   Pertinent  Health Maintenance Due  Topic Date Due  . FOOT EXAM  06/06/1940  . OPHTHALMOLOGY EXAM  06/06/1940  . URINE MICROALBUMIN  06/06/1940  . PNA vac Low Risk Adult (1 of 2 - PCV13) 06/05/1997  . INFLUENZA VACCINE  10/31/2016  . HEMOGLOBIN A1C  12/27/2016   Fall Risk  01/19/2016 07/21/2015  Falls in the past year? Yes Yes  Number falls in past yr: 1 1  Injury with Fall? No No  Risk for fall due to : Impaired balance/gait -  Follow up Falls prevention discussed Falls prevention discussed   Functional Status Survey:    Vitals:   07/26/16 0943  BP: (!) 150/88  Pulse: 78  Resp: 16  Temp: 98.8 F (37.1 C)  Weight: 175 lb 9.6 oz (79.7 kg)  Height: 5\' 9"  (1.753 m)   Body mass index is 25.93 kg/m. Physical Exam  Constitutional: He appears well-developed and well-nourished.  HENT:  Head: Normocephalic and atraumatic.  Eyes: Conjunctivae and EOM are normal. Pupils are equal, round, and reactive to light.  Neck: Normal range of motion. Neck supple. No thyromegaly present.  Cardiovascular: Normal rate, regular rhythm and normal heart sounds.   No murmur  heard. Pulmonary/Chest: Effort normal and breath sounds normal. No respiratory distress. He has no wheezes.  Abdominal: Soft. Bowel sounds are normal. He exhibits no distension. There is no tenderness.  External hemorrhoids x2  Musculoskeletal: He exhibits tenderness. He exhibits no edema.  Bilateral hip replacements, left hip fx,  07/09/16 touch down weigh bearing  Neurological: He is alert. He has normal reflexes. No cranial nerve deficit. Coordination normal.  Skin: Skin is warm and dry. No rash noted. No erythema. No pallor.  A small open area R buttock, no s/s of infection.   Psychiatric: He has a normal mood and affect. His behavior is normal. Thought content normal.    Labs reviewed:  Recent Labs  03/16/16 1201 06/22/16 1057 06/23/16 0430 06/26/16  NA 139 138 138 139  K 4.0 3.9 3.7 3.7  CL 106 105 106  --   CO2 25 26 25   --   GLUCOSE 164* 147* 151*  --   BUN 27* 17 23* 19  CREATININE 1.00 0.98 1.11 0.9  CALCIUM 9.2 9.3 8.4*  --     Recent Labs  03/16/16 1201 06/23/16 0430 06/26/16  AST 22 18 17   ALT 27 23 20   ALKPHOS 77 66 59  BILITOT 0.9 0.7  --   PROT 7.0 5.8*  --   ALBUMIN 4.0 3.2*  --     Recent Labs  03/16/16 1201 06/22/16 1057 06/23/16 0430 06/26/16  WBC 13.2* 18.7* 10.3 9.4  NEUTROABS  --  16.0*  --   --   HGB 15.2 15.0 12.3* 11.3*  HCT 44.2 43.9 36.3* 33*  MCV 97.6 96.9 98.1  --   PLT 124* 130* 113* 117*   Lab Results  Component Value Date   TSH 2.40 06/26/2016   Lab Results  Component Value Date   HGBA1C 6.1 06/26/2016   No results found for: CHOL, HDL, LDLCALC, LDLDIRECT, TRIG, CHOLHDL  Significant Diagnostic Results in last 30 days:  No results found.  Assessment/Plan External hemorrhoid Stable, 2x,  continue topical steroid agent as needed, avoid constipation. Observe .  Diabetes ([place]) Continue Glucophage, 06/27/16 Hgb a1c 6.1  Dementia continue Aricept and Namenda. 06/28/16 18/30, continue SNF for care assistance.   Urinary  frequency Brief, continue Myrbetriq  Depression with anxiety Mood is stable, continue Wellbutrin.   Leukocytosis Last wbc 9.4 06/27/16  History of bilateral hip replacements Continue therapy  Hip fracture ([facility]) Left, 07/09/16 Ortho, touch  down weight bearing. Continue Motrin qid, prn Tylenol and Tramadol.      Family/ staff Communication: SNF  Labs/tests ordered:  none

## 2016-07-26 NOTE — Assessment & Plan Note (Addendum)
Left, 07/09/16 Ortho, touch down weight bearing. Continue Motrin qid, prn Tylenol and Tramadol.

## 2016-07-26 NOTE — Assessment & Plan Note (Signed)
Last wbc 9.4 06/27/16

## 2016-08-08 ENCOUNTER — Ambulatory Visit (INDEPENDENT_AMBULATORY_CARE_PROVIDER_SITE_OTHER): Payer: Medicare Other | Admitting: Podiatry

## 2016-08-08 ENCOUNTER — Encounter: Payer: Self-pay | Admitting: Podiatry

## 2016-08-08 DIAGNOSIS — M79676 Pain in unspecified toe(s): Secondary | ICD-10-CM | POA: Diagnosis not present

## 2016-08-08 DIAGNOSIS — I739 Peripheral vascular disease, unspecified: Secondary | ICD-10-CM

## 2016-08-08 DIAGNOSIS — B351 Tinea unguium: Secondary | ICD-10-CM | POA: Diagnosis not present

## 2016-08-08 NOTE — Progress Notes (Signed)
Patient ID: Jose [name], male   DOB: 08/22/1930, 83 y.o.   MRN: [id number] Complaint:  Visit Type: Patient returns to my office for continued preventative foot care services. Complaint: Patient states" my nails have grown long and thick and become painful to walk and wear shoes" . He presents for preventative foot care services. Patient has fallen and now wears compression socks.  Podiatric Exam: Vascular: dorsalis pedis and posterior tibial pulses are not  palpable bilateral. Capillary return is diminished. Temperature gradient is diminished. Skin turgor  diminished .  Purplish discoloration feet  B/LSensorium: Normal Semmes Weinstein monofilament test. Normal tactile sensation bilaterally. Nail Exam: Pt has thick disfigured discolored nails with subungual debris noted bilateral entire nail hallux through fifth toenails Ulcer Exam: There is no evidence of ulcer or pre-ulcerative changes or infection. Orthopedic Exam: Muscle tone and strength are WNL. No limitations in general ROM. No crepitus or effusions noted. Foot type and digits show no abnormalities. Bony prominences are unremarkable. Skin: No Porokeratosis. No infection or ulcers  Diagnosis:  Tinea unguium, Pain in right toe, pain in left toes, PVD  Treatment & Plan Procedures and Treatment: Consent by patient was obtained for treatment procedures. The patient understood the discussion of treatment and procedures well. All questions were answered thoroughly reviewed. Debridement of mycotic and hypertrophic toenails, 1 through 5 bilateral and clearing of subungual debris. No ulceration, no infection noted.  Return Visit-Office Procedure: Patient instructed to return to the office for a follow up visit 3 months for continued evaluation and treatment.  Anaisa Radi DPM 

## 2016-08-16 ENCOUNTER — Other Ambulatory Visit: Payer: Self-pay | Admitting: *Deleted

## 2016-08-16 LAB — LIPID PANEL
Cholesterol: 118 mg/dL (ref 0–200)
HDL: 53 mg/dL (ref 35–70)
LDL Cholesterol: 50 mg/dL
LDl/HDL Ratio: 2.2
TRIGLYCERIDES: 66 mg/dL (ref 40–160)

## 2016-08-23 ENCOUNTER — Non-Acute Institutional Stay (SKILLED_NURSING_FACILITY): Payer: Medicare Other | Admitting: Nurse Practitioner

## 2016-08-23 ENCOUNTER — Encounter: Payer: Self-pay | Admitting: Nurse Practitioner

## 2016-08-23 DIAGNOSIS — F418 Other specified anxiety disorders: Secondary | ICD-10-CM

## 2016-08-23 DIAGNOSIS — L309 Dermatitis, unspecified: Secondary | ICD-10-CM

## 2016-08-23 DIAGNOSIS — F0280 Dementia in other diseases classified elsewhere without behavioral disturbance: Secondary | ICD-10-CM | POA: Diagnosis not present

## 2016-08-23 DIAGNOSIS — G309 Alzheimer's disease, unspecified: Secondary | ICD-10-CM

## 2016-08-23 DIAGNOSIS — E118 Type 2 diabetes mellitus with unspecified complications: Secondary | ICD-10-CM | POA: Diagnosis not present

## 2016-08-23 DIAGNOSIS — R350 Frequency of micturition: Secondary | ICD-10-CM | POA: Diagnosis not present

## 2016-08-23 NOTE — Assessment & Plan Note (Signed)
continue Aricept and Namenda. 06/28/16 18/30, continue SNF for care assistance.

## 2016-08-23 NOTE — Progress Notes (Signed)
Location:  [facility] Room Number: 35 Place of Service:  SNF (31) Provider:  Mast, Manxie  NP  Estill Dooms, MD  Patient Care Team: Estill Dooms, MD as PCP - General (Internal Medicine) Mast, Man X, NP as Nurse Practitioner (Internal Medicine)  Extended Emergency Contact Information Primary Emergency Contact: [name] Address: [place], [place] Phone: [phone] Mobile Phone: [phone] Relation: Spouse Secondary Emergency Contact: [name] States of [place] Phone: [phone] Work Phone: [phone] Relation: Daughter  Code Status:  DNR Goals of care: Advanced Directive information Advanced Directives 08/23/2016  Does Patient Have a Medical Advance Directive? No  Type of Advance Directive Out of facility DNR (pink MOST or yellow form)  Does patient want to make changes to medical advance directive? No - Patient declined  Would patient like information on creating a medical advance directive? No - Patient declined  Pre-existing out of facility DNR order (yellow form or pink MOST form) Yellow form placed in chart (order not valid for inpatient use)     Chief Complaint  Patient presents with  . Acute Visit    (L) dry skin on neck, ear, red and flaky.    HPI:  Pt is a 83 y.o. male seen today for itching red flaky area in the left neck and ear area, no s/s of infection, duration uncertain, no recent change of bedding, clothing, soap, lotion, laundry detergent.      06/22/16 to 06/23/16 left Hip x-ray in the ED showed a nondisplaced fracture isolated to the left greater trochanter extending to the femoral stem component of the arthroplasty, Hx of bilateral hip replacements Hx of dementia, taking Aricept 25mg  Namenda 28mg  daily. Depression/anxiety, takign Wellbutrin 150mg  bid. T2DM, taking Glucophage, last Hgb a1c 6.1 06/26/16,  Urinary frequency, taking  Myrbetriq  Past Medical History:  Diagnosis Date  . Abnormality of gait   . Backache, unspecified   . Colon polyps    adenomatous  . Dementia 11/12/2012  . Diverticulosis   . Essential and other specified forms of tremor   . Hemorrhoids   . History of bilateral hip replacements 11/12/2012  . Memory loss   . 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 Pain in joint, pelvic region and thigh   . Skin cancer of scalp   . Spinal stenosis, lumbar region, without neurogenic claudication   . Unspecified hereditary and idiopathic peripheral neuropathy    Past Surgical History:  Procedure Laterality Date  . CATARACT EXTRACTION, BILATERAL  2012  . RETINAL LASER PROCEDURE  2012   retinal wrinkle  . SKIN CANCER EXCISION    . TOTAL HIP ARTHROPLASTY Left 2000  . TOTAL HIP ARTHROPLASTY (aka REPLACEMENT) Right 2011    Allergies  Allergen Reactions  . Axona [Flora-Q]     Pt reported  . Gabapentin     Pt reported    Outpatient Encounter Prescriptions as of 08/23/2016  Medication Sig  . acetaminophen (TYLENOL) 325 MG tablet Take 2 tablets (650 mg total) by mouth every 6 (six) hours as needed for mild pain (or Fever >/= 101).  Marland Kitchen acetaminophen (TYLENOL) 500 MG tablet Take 1,000 mg by mouth every 8 (eight) hours as needed.  . Ascorbic Acid (VITAMIN C) 1000 MG tablet Take 1,000 mg by mouth daily.    Marland Kitchen buPROPion (WELLBUTRIN SR) 150 MG 12 hr tablet Take 150 mg  by mouth daily.   . Cholecalciferol (VITAMIN D) 1000 UNITS capsule Take 1,000 Units by mouth daily.    Marland Kitchen dextromethorphan (DELSYM) 30 MG/5ML liquid Take 30 mg by mouth at bedtime as needed for cough.  . donepezil (ARICEPT) 23 MG TABS tablet Take 1 tablet (23 mg total) by mouth daily.  . dorzolamide-timolol (COSOPT) 22.3-6.8 MG/ML ophthalmic solution Place 1 drop into the left eye 2 (two) times daily.   . Flaxseed, Linseed, (FLAXSEED OIL) 1000 MG CAPS Take 1,000 mg by mouth daily.    . folic acid (FOLVITE)  233 MCG tablet Take 800 mcg by mouth every evening.   Marland Kitchen ibuprofen (ADVIL,MOTRIN) 400 MG tablet Take 1 tablet (400 mg total) by mouth 4 (four) times daily. For 3 days. Then Every 4 hrs PRN moderate pain.  Marland Kitchen loperamide (IMODIUM A-D) 2 MG tablet Take 2-4 mg by mouth 4 (four) times daily as needed for diarrhea or loose stools.  . memantine (NAMENDA XR) 28 MG CP24 24 hr capsule Take 1 capsule (28 mg total) by mouth daily.  . metFORMIN (GLUCOPHAGE) 500 MG tablet Take 500 mg by mouth daily with breakfast.   . mirabegron ER (MYRBETRIQ) 50 MG TB24 tablet Take 50 mg by mouth daily.  . Multiple Vitamin (MULTIVITAMIN) tablet Take 1 tablet by mouth daily.    Marland Kitchen nystatin (NYSTATIN) powder Apply topically. Apply to right and left buttocks twice a day  . potassium chloride (KLOR-CON) 10 MEQ CR tablet Take 10 mEq by mouth 2 (two) times a week.    . Probiotic Product (ALIGN) 4 MG CAPS Take 4 mg by mouth daily.  . simvastatin (ZOCOR) 40 MG tablet Take 40 mg by mouth every evening.   . traMADol (ULTRAM) 50 MG tablet Take 1 tablet (50 mg total) by mouth every 6 (six) hours as needed for severe pain.   No facility-administered encounter medications on file as of 08/23/2016.     Review of Systems  Constitutional: Positive for activity change. Negative for fatigue and unexpected weight change.  HENT: Positive for hearing loss. Negative for congestion, drooling, ear pain and mouth sores.   Eyes: Negative for pain, discharge, redness, itching and visual disturbance.  Respiratory: Negative for cough, chest tightness and shortness of breath.   Cardiovascular: Negative for chest pain, palpitations and leg swelling.  Gastrointestinal: Negative for abdominal distention, abdominal pain and constipation.       External hemorrhoids, no bleeding or inflammation, prn steroid cream use.   Endocrine: Negative for cold intolerance, heat intolerance, polydipsia and polyphagia.  Genitourinary: Positive for frequency. Negative for  dysuria and urgency.  Musculoskeletal: Positive for arthralgias and gait problem. Negative for joint swelling.       Left hip fx, 07/09/16 Ortho touch down   Skin: Positive for rash. Negative for color change.        itching red flaky area in the left neck and ear area, no s/s of infection, duration uncertain, no recent change of bedding, clothing, soap, lotion, laundry detergent.    Allergic/Immunologic: Negative for food allergies.  Neurological: Negative for dizziness, syncope, facial asymmetry, speech difficulty, numbness and headaches.       Dementia  Hematological: Negative for adenopathy. Does not bruise/bleed easily.       Chronic thrombocytopenia.  Psychiatric/Behavioral: Positive for confusion, decreased concentration and dysphoric mood (hx of depression and anxiety.). Negative for agitation, behavioral problems, hallucinations and sleep disturbance. The patient is not nervous/anxious.     Immunization History  Administered Date(s) Administered  .  PPD Test 06/24/2016   Pertinent  Health Maintenance Due  Topic Date Due  . FOOT EXAM  06/06/1940  . OPHTHALMOLOGY EXAM  06/06/1940  . URINE MICROALBUMIN  06/06/1940  . PNA vac Low Risk Adult (1 of 2 - PCV13) 06/05/1997  . INFLUENZA VACCINE  10/31/2016  . HEMOGLOBIN A1C  12/27/2016   Fall Risk  01/19/2016 07/21/2015  Falls in the past year? Yes Yes  Number falls in past yr: 1 1  Injury with Fall? No No  Risk for fall due to : Impaired balance/gait -  Follow up Falls prevention discussed Falls prevention discussed   Functional Status Survey:    Vitals:   08/23/16 1420  BP: 120/80  Pulse: 90  Resp: 16  Weight: 175 lb 6.4 oz (79.6 kg)  Height: 5\' 9"  (1.753 m)   Body mass index is 25.9 kg/m. Physical Exam  Constitutional: He appears well-developed and well-nourished.  HENT:  Head: Normocephalic and atraumatic.  Eyes: Conjunctivae and EOM are normal. Pupils are equal, round, and reactive to light.  Neck: Normal range of  motion. Neck supple. No thyromegaly present.  Cardiovascular: Normal rate, regular rhythm and normal heart sounds.   No murmur heard. Pulmonary/Chest: Effort normal and breath sounds normal. No respiratory distress. He has no wheezes.  Abdominal: Soft. Bowel sounds are normal. He exhibits no distension. There is no tenderness.  External hemorrhoids x2  Musculoskeletal: He exhibits tenderness. He exhibits no edema.  Bilateral hip replacements, left hip fx,  07/09/16 touch down weigh bearing  Neurological: He is alert. He has normal reflexes. No cranial nerve deficit. Coordination normal.  Skin: Skin is warm and dry. Rash noted. No erythema. No pallor.   itching red flaky area in the left neck and ear area, no s/s of infection, duration uncertain, no recent change of bedding, clothing, soap, lotion, laundry detergent.    Psychiatric: He has a normal mood and affect. His behavior is normal. Thought content normal.    Labs reviewed:  Recent Labs  03/16/16 1201 06/22/16 1057 06/23/16 0430 06/26/16  NA 139 138 138 139  K 4.0 3.9 3.7 3.7  CL 106 105 106  --   CO2 25 26 25   --   GLUCOSE 164* 147* 151*  --   BUN 27* 17 23* 19  CREATININE 1.00 0.98 1.11 0.9  CALCIUM 9.2 9.3 8.4*  --     Recent Labs  03/16/16 1201 06/23/16 0430 06/26/16  AST 22 18 17   ALT 27 23 20   ALKPHOS 77 66 59  BILITOT 0.9 0.7  --   PROT 7.0 5.8*  --   ALBUMIN 4.0 3.2*  --     Recent Labs  03/16/16 1201 06/22/16 1057 06/23/16 0430 06/26/16  WBC 13.2* 18.7* 10.3 9.4  NEUTROABS  --  16.0*  --   --   HGB 15.2 15.0 12.3* 11.3*  HCT 44.2 43.9 36.3* 33*  MCV 97.6 96.9 98.1  --   PLT 124* 130* 113* 117*   Lab Results  Component Value Date   TSH 2.40 06/26/2016   Lab Results  Component Value Date   HGBA1C 6.1 06/26/2016   Lab Results  Component Value Date   CHOL 118 08/16/2016   HDL 53 08/16/2016   LDLCALC 50 08/16/2016   TRIG 66 08/16/2016    Significant Diagnostic Results in last 30 days:    No results found.  Assessment/Plan Dermatitis  itching red flaky area in the left neck and ear area, no s/s  of infection, duration uncertain, no recent change of bedding, clothing, soap, lotion, laundry detergent.  Apply 1% Hydrocortisone cream bid to affected area bid, observe the patient.   Diabetes ([place]) 08/06/16 continue Metformin 500mg  qd Monitor CBG  Dementia continue Aricept and Namenda. 06/28/16 18/30, continue SNF for care assistance.   Urinary frequency Brief, continue Myrbetriq  Depression with anxiety Mood is stable, continue Wellbutrin.      Family/ staff Communication: SNF  Labs/tests ordered:  none

## 2016-08-23 NOTE — Assessment & Plan Note (Signed)
Brief, continue Myrbetriq

## 2016-08-23 NOTE — Assessment & Plan Note (Signed)
itching red flaky area in the left neck and ear area, no s/s of infection, duration uncertain, no recent change of bedding, clothing, soap, lotion, laundry detergent.  Apply 1% Hydrocortisone cream bid to affected area bid, observe the patient.

## 2016-08-23 NOTE — Assessment & Plan Note (Signed)
Mood is stable, continue Wellbutrin.

## 2016-08-23 NOTE — Assessment & Plan Note (Signed)
08/06/16 continue Metformin 500mg  qd Monitor CBG

## 2016-09-21 ENCOUNTER — Encounter: Payer: Self-pay | Admitting: Nurse Practitioner

## 2016-09-21 ENCOUNTER — Non-Acute Institutional Stay (SKILLED_NURSING_FACILITY): Payer: Medicare Other | Admitting: Nurse Practitioner

## 2016-09-21 DIAGNOSIS — R21 Rash and other nonspecific skin eruption: Secondary | ICD-10-CM | POA: Diagnosis not present

## 2016-09-21 DIAGNOSIS — L89312 Pressure ulcer of right buttock, stage 2: Secondary | ICD-10-CM | POA: Diagnosis not present

## 2016-09-21 DIAGNOSIS — F418 Other specified anxiety disorders: Secondary | ICD-10-CM | POA: Diagnosis not present

## 2016-09-21 DIAGNOSIS — D696 Thrombocytopenia, unspecified: Secondary | ICD-10-CM

## 2016-09-21 DIAGNOSIS — E118 Type 2 diabetes mellitus with unspecified complications: Secondary | ICD-10-CM

## 2016-09-21 DIAGNOSIS — S72002D Fracture of unspecified part of neck of left femur, subsequent encounter for closed fracture with routine healing: Secondary | ICD-10-CM | POA: Diagnosis not present

## 2016-09-21 DIAGNOSIS — G309 Alzheimer's disease, unspecified: Secondary | ICD-10-CM | POA: Diagnosis not present

## 2016-09-21 DIAGNOSIS — R350 Frequency of micturition: Secondary | ICD-10-CM

## 2016-09-21 DIAGNOSIS — F0280 Dementia in other diseases classified elsewhere without behavioral disturbance: Secondary | ICD-10-CM

## 2016-09-21 NOTE — Progress Notes (Signed)
Location:  [facility] Room Number: 56 Place of Service:  SNF (31) Provider:  Mast, Manxie  NP  [name], MD  Patient Care Team: [name], MD as PCP - General (Internal Medicine) Mast, Man X, NP as Nurse Practitioner (Internal Medicine)  Extended Emergency Contact Information Primary Emergency Contact: [name] Address: [place], [place] of [place] Phone: [phone] Mobile Phone: [phone] Relation: Spouse Secondary Emergency Contact: [name] States of [place] Phone: [phone] Work Phone: [phone] Relation: Daughter  Code Status:  DNR Goals of care: Advanced Directive information Advanced Directives 09/21/2016  Does Patient Have a Medical Advance Directive? No  Type of Advance Directive -  Does patient want to make changes to medical advance directive? No - Patient declined  Would patient like information on creating a medical advance directive? -  Pre-existing out of facility DNR order (yellow form or pink MOST form) -     Chief Complaint  Patient presents with  . Acute Visit    evaluation for possible discharge    HPI:  Pt is a 83 y.o. male seen today for an acute visit for evaluation of his chronic medical conditions    06/22/16 to 06/23/16 left Hip x-ray in the ED showed a nondisplaced fracture isolated to the left greater trochanter extending to the femoral stem component of the arthroplasty, Hx of bilateral hip replacements. Healing nicely, he is w/c dependent.  Hx of dementia, taking Aricept '25mg'$  Namenda '28mg'$  daily. Depression/anxiety, mood is stable, takign Wellbutrin '150mg'$  bid. T2DM, taking Glucophage, last Hgb a1c 6.1 06/26/16,  Urinary frequency, taking Myrbetriq  Past Medical History:  Diagnosis Date  . Abnormality of gait   . Backache, unspecified   . Colon polyps    adenomatous  . Dementia 11/12/2012  . Diverticulosis   . Essential  and other specified forms of tremor   . Hemorrhoids   . History of bilateral hip replacements 11/12/2012  . Memory loss   . 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 Pain in joint, pelvic region and thigh   . Skin cancer of scalp   . Spinal stenosis, lumbar region, without neurogenic claudication   . Unspecified hereditary and idiopathic peripheral neuropathy    Past Surgical History:  Procedure Laterality Date  . CATARACT EXTRACTION, BILATERAL  2012  . RETINAL LASER PROCEDURE  2012   retinal wrinkle  . SKIN CANCER EXCISION    . TOTAL HIP ARTHROPLASTY Left 2000  . TOTAL HIP ARTHROPLASTY (aka REPLACEMENT) Right 2011    Allergies  Allergen Reactions  . Axona [Flora-Q]     Pt reported  . Gabapentin     Pt reported    Outpatient Encounter Prescriptions as of 09/21/2016  Medication Sig  . acetaminophen (TYLENOL) 325 MG tablet Take 2 tablets (650 mg total) by mouth every 6 (six) hours as needed for mild pain (or Fever >/= 101).  Marland Kitchen acetaminophen (TYLENOL) 500 MG tablet Take 1,000 mg by mouth every 8 (eight) hours as needed.  . Ascorbic Acid (VITAMIN C) 1000 MG tablet Take 1,000 mg by mouth daily.    Marland Kitchen buPROPion (WELLBUTRIN SR) 150 MG 12 hr tablet Take 150 mg by mouth daily.   . Cholecalciferol (VITAMIN D) 1000 UNITS capsule Take 1,000 Units by mouth daily.    Marland Kitchen dextromethorphan (DELSYM) 30 MG/5ML liquid Take 30 mg by mouth at bedtime as needed for  cough.  . donepezil (ARICEPT) 23 MG TABS tablet Take 1 tablet (23 mg total) by mouth daily.  . dorzolamide-timolol (COSOPT) 22.3-6.8 MG/ML ophthalmic solution Place 1 drop into the left eye 2 (two) times daily.   . Flaxseed, Linseed, (FLAXSEED OIL) 1000 MG CAPS Take 1,000 mg by mouth daily.    . folic acid (FOLVITE) 295 MCG tablet Take 800 mcg by mouth every evening.   . hydrocortisone (ANUSOL-HC) 2.5 % rectal cream Place 1 application rectally daily as needed for hemorrhoids or itching.  . hydrocortisone 1  % lotion Apply 1 application topically 2 (two) times daily.  Marland Kitchen ibuprofen (ADVIL,MOTRIN) 400 MG tablet Take 1 tablet (400 mg total) by mouth 4 (four) times daily. For 3 days. Then Every 4 hrs PRN moderate pain.  Marland Kitchen loperamide (IMODIUM A-D) 2 MG tablet Take 2-4 mg by mouth 4 (four) times daily as needed for diarrhea or loose stools.  . memantine (NAMENDA XR) 28 MG CP24 24 hr capsule Take 1 capsule (28 mg total) by mouth daily.  . metFORMIN (GLUCOPHAGE) 500 MG tablet Take 500 mg by mouth daily with breakfast.   . mirabegron ER (MYRBETRIQ) 50 MG TB24 tablet Take 50 mg by mouth daily.  . Multiple Vitamin (MULTIVITAMIN) tablet Take 1 tablet by mouth daily.    Marland Kitchen nystatin (NYSTATIN) powder Apply topically. Apply to right and left buttocks twice a day  . potassium chloride (KLOR-CON) 10 MEQ CR tablet Take 10 mEq by mouth 2 (two) times a week.    . Probiotic Product (ALIGN) 4 MG CAPS Take 4 mg by mouth daily.  . simvastatin (ZOCOR) 40 MG tablet Take 40 mg by mouth every evening.   . traMADol (ULTRAM) 50 MG tablet Take 1 tablet (50 mg total) by mouth every 6 (six) hours as needed for severe pain.   No facility-administered encounter medications on file as of 09/21/2016.     Review of Systems  Constitutional: Negative for activity change, fatigue and unexpected weight change.       ROS was provided with assistance of staff  HENT: Positive for hearing loss. Negative for congestion, drooling, ear pain and mouth sores.   Eyes: Negative for pain, discharge, redness, itching and visual disturbance.  Respiratory: Negative for cough, chest tightness and shortness of breath.   Cardiovascular: Negative for chest pain, palpitations and leg swelling.  Gastrointestinal: Negative for abdominal distention, abdominal pain and constipation.       External hemorrhoids, no bleeding or inflammation, prn steroid cream use.   Endocrine: Negative for cold intolerance, heat intolerance, polydipsia and polyphagia.    Genitourinary: Positive for frequency. Negative for dysuria and urgency.  Musculoskeletal: Positive for arthralgias and gait problem. Negative for joint swelling.       Left hip fx, 07/09/16 Ortho touch down   Skin: Negative for color change and rash.          Allergic/Immunologic: Negative for food allergies.  Neurological: Negative for dizziness, syncope, facial asymmetry, speech difficulty, numbness and headaches.       Dementia  Hematological: Negative for adenopathy. Does not bruise/bleed easily.       Chronic thrombocytopenia.  Psychiatric/Behavioral: Positive for confusion. Negative for agitation, behavioral problems, decreased concentration, dysphoric mood (hx of depression and anxiety.), hallucinations and sleep disturbance. The patient is not nervous/anxious.     Immunization History  Administered Date(s) Administered  . PPD Test 06/24/2016   Pertinent  Health Maintenance Due  Topic Date Due  . FOOT EXAM  06/06/1940  .  OPHTHALMOLOGY EXAM  06/06/1940  . URINE MICROALBUMIN  06/06/1940  . PNA vac Low Risk Adult (1 of 2 - PCV13) 06/05/1997  . INFLUENZA VACCINE  10/31/2016  . HEMOGLOBIN A1C  12/27/2016   Fall Risk  01/19/2016 07/21/2015  Falls in the past year? Yes Yes  Number falls in past yr: 1 1  Injury with Fall? No No  Risk for fall due to : Impaired balance/gait -  Follow up Falls prevention discussed Falls prevention discussed   Functional Status Survey:    Vitals:   09/21/16 1428  BP: 130/90  Pulse: 84  Resp: 16  Weight: 179 lb (81.2 kg)  Height: '5\' 9"'$  (1.753 m)   Body mass index is 26.43 kg/m. Physical Exam  Constitutional: He appears well-developed and well-nourished.  HENT:  Head: Normocephalic and atraumatic.  Eyes: Conjunctivae and EOM are normal. Pupils are equal, round, and reactive to light.  Neck: Normal range of motion. Neck supple. No thyromegaly present.  Cardiovascular: Normal rate and regular rhythm.   Murmur heard. Soft 2/6 systolic  murmur  Pulmonary/Chest: Effort normal and breath sounds normal. No respiratory distress. He has no wheezes.  Abdominal: Soft. Bowel sounds are normal. He exhibits no distension. There is no tenderness.  External hemorrhoids x2  Musculoskeletal: He exhibits tenderness. He exhibits no edema.  Bilateral hip replacements, left hip fx,  07/09/16 touch down weigh bearing  Neurological: He is alert. He has normal reflexes. No cranial nerve deficit. Coordination normal.  Skin: Skin is warm and dry. No rash noted. No erythema. No pallor.     Psychiatric: He has a normal mood and affect. His behavior is normal. Thought content normal.    Labs reviewed:  Recent Labs  03/16/16 1201 06/22/16 1057 06/23/16 0430 06/26/16  NA 139 138 138 139  K 4.0 3.9 3.7 3.7  CL 106 105 106  --   CO2 '25 26 25  '$ --   GLUCOSE 164* 147* 151*  --   BUN 27* 17 23* 19  CREATININE 1.00 0.98 1.11 0.9  CALCIUM 9.2 9.3 8.4*  --     Recent Labs  03/16/16 1201 06/23/16 0430 06/26/16  AST '22 18 17  '$ ALT '27 23 20  '$ ALKPHOS 77 66 59  BILITOT 0.9 0.7  --   PROT 7.0 5.8*  --   ALBUMIN 4.0 3.2*  --     Recent Labs  03/16/16 1201 06/22/16 1057 06/23/16 0430 06/26/16  WBC 13.2* 18.7* 10.3 9.4  NEUTROABS  --  16.0*  --   --   HGB 15.2 15.0 12.3* 11.3*  HCT 44.2 43.9 36.3* 33*  MCV 97.6 96.9 98.1  --   PLT 124* 130* 113* 117*   Lab Results  Component Value Date   TSH 2.40 06/26/2016   Lab Results  Component Value Date   HGBA1C 6.1 06/26/2016   Lab Results  Component Value Date   CHOL 118 08/16/2016   HDL 53 08/16/2016   LDLCALC 50 08/16/2016   TRIG 66 08/16/2016    Significant Diagnostic Results in last 30 days:  No results found.  Assessment/Plan Diabetes ([place]) 08/06/16 continue Metformin '500mg'$  qd 09/21/16 update Hgb a1c, CBC, CMP  Dementia continue Aricept and Namenda. 06/28/16 18/30, only dc home with wife if his ADL assistance can be provided at home  Hip fracture ([place]) Healed nicely,  WBAT  Rash healed  Decubitus ulcer of buttock, stage 2 Healed.   Urinary frequency Brief, continue Myrbetriq  Depression with anxiety Mood is  stable, continue Wellbutrin.   Thrombocytopenia ([place]) Update CBC    Family/ staff Communication: medically stable, possible dc home with wife if care needs at home can be arranged to be met   Labs/tests ordered: CBC CMP Hgb a1c

## 2016-09-21 NOTE — Assessment & Plan Note (Signed)
continue Aricept and Namenda. 06/28/16 18/30, only dc home with wife if his ADL assistance can be provided at home

## 2016-09-21 NOTE — Assessment & Plan Note (Signed)
Healed nicely, WBAT

## 2016-09-21 NOTE — Assessment & Plan Note (Signed)
Healed

## 2016-09-21 NOTE — Assessment & Plan Note (Signed)
Mood is stable, continue Wellbutrin.

## 2016-09-21 NOTE — Assessment & Plan Note (Signed)
Update CBC. 

## 2016-09-21 NOTE — Assessment & Plan Note (Signed)
healed 

## 2016-09-21 NOTE — Assessment & Plan Note (Addendum)
08/06/16 continue Metformin 500mg  qd 09/21/16 update Hgb a1c, CBC, CMP

## 2016-09-21 NOTE — Assessment & Plan Note (Signed)
Brief, continue Myrbetriq

## 2016-09-25 LAB — BASIC METABOLIC PANEL
BUN: 22 — AB (ref 4–21)
CREATININE: 1.2 (ref <=1.3)
GLUCOSE: 133
Potassium: 4 (ref 3.4–5.3)
Sodium: 138 (ref 137–147)

## 2016-09-25 LAB — CBC AND DIFFERENTIAL
HCT: 37 — AB (ref 41–53)
Hemoglobin: 13 — AB (ref 13.5–17.5)
PLATELETS: 127 — AB (ref 150–399)
WBC: 8.2

## 2016-09-25 LAB — HEMOGLOBIN A1C: Hemoglobin A1C: 6.7

## 2016-09-25 LAB — HEPATIC FUNCTION PANEL
ALT: 27 (ref 10–40)
AST: 19 (ref 14–40)
Alkaline Phosphatase: 75 (ref 25–125)
Bilirubin, Total: 0.4

## 2016-09-26 ENCOUNTER — Other Ambulatory Visit: Payer: Self-pay | Admitting: *Deleted

## 2016-09-28 ENCOUNTER — Encounter: Payer: Self-pay | Admitting: Internal Medicine

## 2016-09-28 ENCOUNTER — Non-Acute Institutional Stay (SKILLED_NURSING_FACILITY): Payer: Medicare Other | Admitting: Internal Medicine

## 2016-09-28 DIAGNOSIS — N3281 Overactive bladder: Secondary | ICD-10-CM

## 2016-09-28 DIAGNOSIS — G309 Alzheimer's disease, unspecified: Secondary | ICD-10-CM | POA: Diagnosis not present

## 2016-09-28 DIAGNOSIS — S72002S Fracture of unspecified part of neck of left femur, sequela: Secondary | ICD-10-CM

## 2016-09-28 DIAGNOSIS — E119 Type 2 diabetes mellitus without complications: Secondary | ICD-10-CM | POA: Diagnosis not present

## 2016-09-28 DIAGNOSIS — E785 Hyperlipidemia, unspecified: Secondary | ICD-10-CM

## 2016-09-28 DIAGNOSIS — F0280 Dementia in other diseases classified elsewhere without behavioral disturbance: Secondary | ICD-10-CM | POA: Diagnosis not present

## 2016-09-28 NOTE — Progress Notes (Signed)
Location:  [facility] Room Number: 8 Place of Service:  SNF (31) Provider:  [name] MD  [name], MD  Patient Care Team: [name], MD as PCP - General (Internal Medicine) Mast, Man X, NP as Nurse Practitioner (Internal Medicine)  Extended Emergency Contact Information Primary Emergency Contact: [name] Address: [place], [place] Phone: [phone] Mobile Phone: [phone] Relation: Spouse Secondary Emergency Contact: [name] [place] Phone: [phone] Work Phone: [phone] Relation: Daughter  Code Status:  Full Code Goals of care: Advanced Directive information Advanced Directives 09/21/2016  Does Patient Have a Medical Advance Directive? No  Type of Advance Directive -  Does patient want to make changes to medical advance directive? No - Patient declined  Would patient like information on creating a medical advance directive? -  Pre-existing out of facility DNR order (yellow form or pink MOST form) -     Chief Complaint  Patient presents with  . Medical Management of Chronic Issues    Routine Visit     HPI:  Patient is a 83 y.o. male seen today for medical management of chronic diseases.  He has dementia and is pleasantly confused. He repeats his sentences. No fall has been reported. He gets around with his wheelchair but has to be wheeled. No acute behavioral changes reported by nursing.   Past Medical History:  Diagnosis Date  . Abnormality of gait   . Backache, unspecified   . Colon polyps    adenomatous  . Dementia 11/12/2012  . Diverticulosis   . Essential and other specified forms of tremor   . Hemorrhoids   . History of bilateral hip replacements 11/12/2012  . Memory loss   . 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 Pain in joint, pelvic region and thigh   . Skin cancer of scalp   .  Spinal stenosis, lumbar region, without neurogenic claudication   . Unspecified hereditary and idiopathic peripheral neuropathy    Past Surgical History:  Procedure Laterality Date  . CATARACT EXTRACTION, BILATERAL  2012  . RETINAL LASER PROCEDURE  2012   retinal wrinkle  . SKIN CANCER EXCISION    . TOTAL HIP ARTHROPLASTY Left 2000  . TOTAL HIP ARTHROPLASTY (aka REPLACEMENT) Right 2011    Allergies  Allergen Reactions  . Axona [Flora-Q]     Pt reported  . Gabapentin     Pt reported    Outpatient Encounter Prescriptions as of 09/28/2016  Medication Sig  . acetaminophen (TYLENOL) 325 MG tablet Take 650 mg by mouth every 4 (four) hours as needed for fever.  Marland Kitchen acetaminophen (TYLENOL) 500 MG tablet Take 500 mg by mouth every 8 (eight) hours as needed.   Marland Kitchen buPROPion (WELLBUTRIN SR) 150 MG 12 hr tablet Take 150 mg by mouth daily.   . Cholecalciferol (VITAMIN D) 1000 UNITS capsule Take 1,000 Units by mouth daily.    Marland Kitchen dextromethorphan (DELSYM) 30 MG/5ML liquid Take 30 mg by mouth at bedtime as needed for cough.  . donepezil (ARICEPT) 23 MG TABS tablet Take 1 tablet (23 mg total) by mouth daily.  . dorzolamide-timolol (COSOPT) 22.3-6.8 MG/ML ophthalmic solution Place 1 drop into the left eye 2 (two) times daily.   . Flaxseed, Linseed, (FLAXSEED OIL) 1000 MG CAPS Take 1,000 mg by mouth daily.    . folic acid (FOLVITE) 128 MCG tablet Take  800 mcg by mouth every evening.   . hydrocortisone (ANUSOL-HC) 2.5 % rectal cream Place 1 application rectally 2 (two) times daily.   . hydrocortisone 1 % lotion Apply 1 application topically 2 (two) times daily.  . hydrocortisone 2.5 % cream Apply 1 application topically at bedtime as needed (apply to rectal area).  Marland Kitchen ibuprofen (ADVIL,MOTRIN) 400 MG tablet Take 400 mg by mouth every 6 (six) hours as needed.  . loperamide (IMODIUM A-D) 2 MG tablet Take 2 mg by mouth 4 (four) times daily as needed for diarrhea or loose stools.   . memantine (NAMENDA XR) 28  MG CP24 24 hr capsule Take 1 capsule (28 mg total) by mouth daily.  . metFORMIN (GLUCOPHAGE) 500 MG tablet Take 500 mg by mouth daily with breakfast.   . mirabegron ER (MYRBETRIQ) 50 MG TB24 tablet Take 50 mg by mouth daily.  . Multiple Vitamin (MULTIVITAMIN) tablet Take 1 tablet by mouth daily.    Marland Kitchen nystatin (NYSTATIN) powder Apply topically. Apply to right and left buttocks twice a day  . potassium chloride (KLOR-CON) 10 MEQ CR tablet Take 10 mEq by mouth 2 (two) times a week.    . Probiotic Product (ALIGN) 4 MG CAPS Take 4 mg by mouth daily.  . simvastatin (ZOCOR) 40 MG tablet Take 40 mg by mouth every evening.   . traMADol (ULTRAM) 50 MG tablet Take 50 mg by mouth every 4 (four) hours as needed.  . [DISCONTINUED] acetaminophen (TYLENOL) 325 MG tablet Take 2 tablets (650 mg total) by mouth every 6 (six) hours as needed for mild pain (or Fever >/= 101).  . [DISCONTINUED] Ascorbic Acid (VITAMIN C) 1000 MG tablet Take 1,000 mg by mouth daily.    . [DISCONTINUED] ibuprofen (ADVIL,MOTRIN) 400 MG tablet Take 1 tablet (400 mg total) by mouth 4 (four) times daily. For 3 days. Then Every 4 hrs PRN moderate pain.  . [DISCONTINUED] traMADol (ULTRAM) 50 MG tablet Take 1 tablet (50 mg total) by mouth every 6 (six) hours as needed for severe pain.   No facility-administered encounter medications on file as of 09/28/2016.     Review of Systems  Constitutional: Negative for appetite change, chills, diaphoresis and fever.  HENT: Negative for drooling, mouth sores, rhinorrhea, sore throat and trouble swallowing.   Eyes:       Wears glasses  Respiratory: Negative for cough and shortness of breath.   Cardiovascular: Positive for leg swelling. Negative for chest pain and palpitations.  Gastrointestinal: Negative for abdominal pain, constipation, diarrhea, nausea and vomiting.  Genitourinary: Negative for dysuria.  Neurological: Negative for dizziness and seizures.  Psychiatric/Behavioral: Positive for  confusion.    Immunization History  Administered Date(s) Administered  . PPD Test 06/24/2016   Pertinent  Health Maintenance Due  Topic Date Due  . URINE MICROALBUMIN  04/02/2017 (Originally 06/06/1940)  . PNA vac Low Risk Adult (1 of 2 - PCV13) 04/02/2017 (Originally 06/05/1997)  . FOOT EXAM  09/28/2017 (Originally 06/06/1940)  . OPHTHALMOLOGY EXAM  09/28/2017 (Originally 06/06/1940)  . INFLUENZA VACCINE  10/31/2016  . HEMOGLOBIN A1C  03/27/2017   Fall Risk  01/19/2016 07/21/2015  Falls in the past year? Yes Yes  Number falls in past yr: 1 1  Injury with Fall? No No  Risk for fall due to : Impaired balance/gait -  Follow up Falls prevention discussed Falls prevention discussed   Functional Status Survey:    Vitals:   09/28/16 1050  BP: 126/78  Pulse: 78  Resp: 16  Weight: 179 lb (81.2 kg)  Height: 5\' 9"  (1.753 m)   Body mass index is 26.43 kg/m. Physical Exam  Constitutional: He appears well-developed and well-nourished. No distress.  HENT:  Head: Normocephalic and atraumatic.  Mouth/Throat: Oropharynx is clear and moist.  Eyes: Conjunctivae are normal. Pupils are equal, round, and reactive to light.  Has corrective glasses  Neck: Normal range of motion. Neck supple.  Cardiovascular: Normal rate and regular rhythm.   Pulmonary/Chest: Effort normal and breath sounds normal. He has no wheezes. He has no rales.  Abdominal: Soft. Bowel sounds are normal. There is no guarding.  Musculoskeletal:  Able to move all 4 extremities, trace leg edema, chronic skin changes to both legs  Lymphadenopathy:    He has no cervical adenopathy.  Neurological: He is alert.  Oriented to self, place and month this visit.   Skin: Skin is warm and dry. He is not diaphoretic.  Chronic stasis changes to lower extremities  Psychiatric: He has a normal mood and affect.  Pleasantly confused    Labs reviewed:  Recent Labs  03/16/16 1201 06/22/16 1057 06/23/16 0430 06/26/16 09/25/16  NA 139  138 138 139 138  K 4.0 3.9 3.7 3.7 4.0  CL 106 105 106  --   --   CO2 25 26 25   --   --   GLUCOSE 164* 147* 151*  --   --   BUN 27* 17 23* 19 22*  CREATININE 1.00 0.98 1.11 0.9 1.2  CALCIUM 9.2 9.3 8.4*  --   --     Recent Labs  03/16/16 1201 06/23/16 0430 06/26/16 09/25/16  AST 22 18 17 19   ALT 27 23 20 27   ALKPHOS 77 66 59 75  BILITOT 0.9 0.7  --   --   PROT 7.0 5.8*  --   --   ALBUMIN 4.0 3.2*  --   --     Recent Labs  03/16/16 1201 06/22/16 1057 06/23/16 0430 06/26/16 09/25/16  WBC 13.2* 18.7* 10.3 9.4 8.2  NEUTROABS  --  16.0*  --   --   --   HGB 15.2 15.0 12.3* 11.3* 13.0*  HCT 44.2 43.9 36.3* 33* 37*  MCV 97.6 96.9 98.1  --   --   PLT 124* 130* 113* 117* 127*   Lab Results  Component Value Date   TSH 2.40 06/26/2016   Lab Results  Component Value Date   HGBA1C 6.7 09/25/2016   Lab Results  Component Value Date   CHOL 118 08/16/2016   HDL 53 08/16/2016   LDLCALC 50 08/16/2016   TRIG 66 08/16/2016    Significant Diagnostic Results in last 30 days:  No results found.   Assessment/Plan  Type 2 diabetes mellitus without complications Lab Results  Component Value Date   HGBA1C 6.7 09/25/2016   Continue metformin 500 mg daily, reviewed blood sugar mostly <150. Check urine microalbumin. reviewed lipid panel.   Hyperlipidemia LDL at goal. Currently on simvastatin 40 mg daily. Decrease this to 20 mg daily. Check lipid panel in 3 months.  Lipid Panel     Component Value Date/Time   CHOL 118 08/16/2016   TRIG 66 08/16/2016   HDL 53 08/16/2016   LDLCALC 50 08/16/2016    Left hip fracture sequelae S/p left hip fracture. Denies pain this visit. Currently on tramadol 50 mg q4h prn pain, tylenol 650 mg q4h prn and ibuprofen 400 mg q6h prn pain and has not required this, decrease tramadol to q6h  prn pain. Discontinue ibuprofen and monitor.   OAB With incontinence. Continue mirabegron and perineal care.  Alzheimer's dementia Provide supportive  care. Continue donepezil and aricept for now.    Family/ staff Communication: reviewed care plan with patient and charge nurse.    Labs/tests ordered:  none   [name], MD Internal Medicine [facility] [address] [place], [place] [address] Cell Phone (Monday-Friday 8 am - 5 pm): [phone] On Call: [phone] and follow prompts after 5 pm and on weekends Office Phone: [phone] Office Fax: [phone]

## 2016-10-01 ENCOUNTER — Encounter: Payer: Self-pay | Admitting: Neurology

## 2016-10-01 ENCOUNTER — Ambulatory Visit (INDEPENDENT_AMBULATORY_CARE_PROVIDER_SITE_OTHER): Payer: Medicare Other | Admitting: Neurology

## 2016-10-01 VITALS — BP 150/83 | HR 86

## 2016-10-01 DIAGNOSIS — F0390 Unspecified dementia without behavioral disturbance: Secondary | ICD-10-CM

## 2016-10-01 DIAGNOSIS — W19XXXS Unspecified fall, sequela: Secondary | ICD-10-CM

## 2016-10-01 NOTE — Patient Instructions (Addendum)
We will keep your meds the same.  I think you are best at this time in skilled nursing.  We will do a follow up in 6 months.

## 2016-10-01 NOTE — Progress Notes (Signed)
Subjective:    Patient ID: Jose [name] is a 83 y.o. male.  HPI     Interim history:   Jose [name] is a very friendly 83 year old right-handed gentleman with an underlying medical history of left eye retinal disease, arthritis and status post L THR in 2000, R THR in 2011, ET with a positive FHx, PN, HLP, HTN, LBP, depression, reflux disease, and memory loss, who presents for followup consultation of his dementia without behavioral disturbance. He is accompanied by his wife again today. I last saw him on 01/19/2016, at which time his wife reported that he was sleeping better, less during the day. The transitions to [facility], independent living. He was not exercising regularly and like to drink tea, not enough water. Memory per wife was mildly worse. His MMSE was 21 which was actually stable from before. I suggested he continue with long-acting Namenda 28 mg daily and Aricept 23 mg daily.  Today, 10/01/2016 (all dictated new, as well as above notes, some dictation done in note pad or Word, outside of chart, may appear as copied):   He reports very little of his own history, denies any pain. Seems to be sleepy, dozes off a few times. Unfortunately, he fell in March and sustained a hip fracture. He is status post bilateral hip replacement surgeries from years ago. He fell at home and I reviewed the hospital records. He had an x-ray of the hip which showed a nondisplaced fracture isolated to the left greater trochanter extending to the femoral stem component of the previous arthroplasty. He was treated conservatively. He was transferred to skilled nursing facility for rehabilitation. He is still at [facility] at [facility] in skilled nursing. He had seen Dr. Alvan Dame a couple of weeks after his hospitalization, was discharged on 06/23/2016 and went to rehabilitation. He was advised to be on complete rest for 4 weeks and had a follow-up with Dr. Alvan Dame after that. He is currently able to use his walker to some  degree with additional assistance, also able to use his feet to roll himself to the dining hall which is 3 times a day for meals. His wife has to eventually make a decision as to whether she should keep them in skilled nursing versus take him home which is independent living at [facility] request. She would have to provide care through caretakers. He had fallen at home while trying to get out of bed and could not get up at which point his wife called for assistance.  The patient's allergies, current medications, family history, past medical history, past social history, past surgical history and problem list were reviewed and updated as appropriate.   Previously (copied from previous notes for reference):   I saw him on 07/21/2015, at which time he reported doing okay. He had not been sleeping well at night and was napping during the day. He was given a medication by PCP, but had not started it did not recall its name. He had a tendency to wander at night. Twice he triggered the house alarm at night. They had a sitter during the day and also for overnight but after 2 weeks they decided to forego this. They were in the process of moving to [place] to independent living. He had been taken off of gabapentin which helped the daytime somnolence and nighttime behaviors. Moodwise and memory wise he was stable. Was not drinking enough water. His MMSE was 20/30, CDT: 4/4, AFT: 9/min. I suggested he continue  with his memory medications, I suggested he try melatonin at night for sleep. They were reminded to call back with the medication Dr. Joylene Draft had suggested for sleep. His wife called on 07/27/2015 reporting that he had been prescribed Seroquel. I suggested we try to hold off on this for now.   I saw him on 01/20/2015, at which time he reported doing fairly well, his wife reported that he was less motivated, somewhat weaker overall, but still was working with a trainer 3 times a week, half an hour at a  time, mostly working on balance, walking and light weight training. He had lower extremity swelling which was stable, memory scores were stable, he was on maximum doses of long-acting Namenda and Aricept and I suggested he continue with medications. MMSE was 20 out of 30 at the time.    I saw him on 07/16/2014, at which time his MMSE was 20, clock drawing was 4, animal fluency was 16. He reported a recent syncopal spell. His wife reported that he had just gotten out of the shower was in the process of dressing himself, sitting on a bench in his room. When she left the room he slumped back onto his bed and was unresponsive. She called 911. She did report that he had a tendency to take very hot showers. He does not always drink enough water. He had been off of his blood pressure medication. They had talked about using compression stockings to his lower extremities but his problem was that they were difficult to get on. Memory was stable. His leg swelling had become worse because he was no longer on a diuretic. I suggested no medication changes. I asked him to continue his long-acting Namenda and Aricept at 23 mg daily. I advised him to drink more water and consider compression stockings to the lower extremities.   I saw him on 11/12/13, at which time his wife reported that he was sleeping more and more and not motivated to do things. He fell once as he held onto the towel rack in the bathroom and tore the rack off the wall and bumped his head. No laceration, no HA, no LOC, no injuries. His memory scores were stable and I kept him on the same medications. In the interim, on 02/21/2014 he presented to the emergency room with painless rectal bleeding. I reviewed the emergency room records. He was hemodynamically stable. He was set up with GI follow-up.   In the interim, he presented to the emergency room again on 06/24/2014 with near syncope versus actual syncopal event at home. I reviewed the emergency room  records. His wife called EMS and the blood pressure was reportedly 82/56 and blood sugar level was 97. His creatinine was 1.44 and this was up from his baseline. He was given IV fluids and improved. His blood pressure improved.     I saw him on 05/14/2013, at which time I did not make any medication changes. He was doing well per wife. He continued to be active. I did encourage him to drink more water. I kept him on long-acting Namenda 28 mg and Aricept 23 mg daily.   I first met him on 11/12/2012, which time I felt that his MMSE was a little worse and his category fluency was also less than before. His MMSE score was 23/30, CDT was 4/4, AFT (Animal Fluency Test) score was 15. He has been on maximum doses of Aricept and Namenda and he did not qualify for the clinical  trial for dementia. I was reluctant to switch him to another anti-cholinergic medication for fear of side effects.   He previously followed with Dr. Morene Antu and was last seen by him on 05/14/2012, which time Dr. Erling Cruz felt that he was a potential candidate for a clinical trial. The patient's MMSE score was 26, clock drawing was 4 out of 4, and his falls assessment tool score was 12.   He started having progressive memory loss in April 2000. This was after his hip surgery. His second hip surgery was done under spinal anesthesia with good results. EMG and nerve conduction studies as well as lumbar spine MRI in November 2012 showed multilevel spondylosis, as moderately severe canal stenosis and broad-based disc bulge at L1-2. B12 level, RPR, TSH were normal. MRI brain with and without contrast on February 2004 showed atrophy and small vessel disease. He had a repeat brain MRI in October 2012. He has been on Aricept since 2004 and [place] since March 2005. He also has peripheral neuropathy for which he had evaluation in January 2009 with SPEP, ACE level, vitamin B12, hemoglobin A1c, PSA, TSH, lipids, CBC, CMP, urinalysis, all normal in February  2010. In December 2012 his MMSE was 65, and in April 2013 his MMSE was 22, clock drawing was 4, animal fluency was 18.   He was started on low dose Lexapro by Dr. Joylene Draft. The patient has had a tendency to pick at his skin lesions on the scalp. His son and daughter are local and involved. 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 He no longer drives. He goes for aqua exercises 3 times/week. He has had no VH/AH. Eating too much and forgetting that he ate has been an issue per wife. He had a sleep study in the past in 2013, which was negative for OSA per wife.     His Past Medical History Is Significant For: Past Medical History:  Diagnosis Date  . Abnormality of gait   . Backache, unspecified   . Colon polyps    adenomatous  . Dementia 11/12/2012  . Diverticulosis   . Essential and other specified forms of tremor   . Hemorrhoids   . History of bilateral hip replacements 11/12/2012  . Memory loss   . 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 Pain in joint, pelvic region and thigh   . Skin cancer of scalp   . Spinal stenosis, lumbar region, without neurogenic claudication   . Unspecified hereditary and idiopathic peripheral neuropathy     His Past Surgical History Is Significant For: Past Surgical History:  Procedure Laterality Date  . CATARACT EXTRACTION, BILATERAL  2012  . RETINAL LASER PROCEDURE  2012   retinal wrinkle  . SKIN CANCER EXCISION    . TOTAL HIP ARTHROPLASTY Left 2000  . TOTAL HIP ARTHROPLASTY (aka REPLACEMENT) Right 2011    His Family History Is Significant For: Family History  Problem Relation Age of Onset  . Heart failure Mother     His Social History Is Significant For: Social History   Social History  . Marital status: Married    Spouse name: Jose [name]  . Number of children: 2  . Years of education: lawyer   Occupational History  . Retired [employer]    retired   Social History Main Topics  . Smoking status:  Former Smoker    Types: Cigarettes  . Smokeless tobacco: Never Used  . Alcohol use 8.4 oz/week  14 Glasses of wine per week     Comment: occas, one glass of wine before dinner,sometimes 1/2 glass  . Drug use: No  . Sexual activity: No   Other Topics Concern  . None   Social History Narrative   Patient is right handed and resides in home with wife    His Allergies Are:  Allergies  Allergen Reactions  . Axona [Flora-Q]     Pt reported  . Gabapentin     Pt reported  :   His Current Medications Are:  Outpatient Encounter Prescriptions as of 10/01/2016  Medication Sig  . acetaminophen (TYLENOL) 325 MG tablet Take 650 mg by mouth every 4 (four) hours as needed for fever.  Marland Kitchen buPROPion (WELLBUTRIN SR) 150 MG 12 hr tablet Take 150 mg by mouth daily.   . Cholecalciferol (VITAMIN D) 1000 UNITS capsule Take 1,000 Units by mouth daily.    Marland Kitchen dextromethorphan (DELSYM) 30 MG/5ML liquid Take 30 mg by mouth at bedtime as needed for cough.  . donepezil (ARICEPT) 23 MG TABS tablet Take 1 tablet (23 mg total) by mouth daily.  . dorzolamide-timolol (COSOPT) 22.3-6.8 MG/ML ophthalmic solution Place 1 drop into the left eye 2 (two) times daily.   . Flaxseed, Linseed, (FLAXSEED OIL) 1000 MG CAPS Take 1,000 mg by mouth daily.    . folic acid (FOLVITE) 454 MCG tablet Take 800 mcg by mouth every evening.   . hydrocortisone 1 % lotion Apply 1 application topically 2 (two) times daily.  . hydrocortisone 2.5 % cream Apply 1 application topically at bedtime as needed (apply to rectal area).  . loperamide (IMODIUM A-D) 2 MG tablet Take 2 mg by mouth 4 (four) times daily as needed for diarrhea or loose stools.   . memantine (NAMENDA XR) 28 MG CP24 24 hr capsule Take 1 capsule (28 mg total) by mouth daily.  . metFORMIN (GLUCOPHAGE) 500 MG tablet Take 500 mg by mouth daily with breakfast.   . mirabegron ER (MYRBETRIQ) 50 MG TB24 tablet Take 50 mg by mouth daily.  . Multiple Vitamin (MULTIVITAMIN) tablet Take 1  tablet by mouth daily.    . potassium chloride (KLOR-CON) 10 MEQ CR tablet Take 10 mEq by mouth 2 (two) times a week.    . Probiotic Product (ALIGN) 4 MG CAPS Take 4 mg by mouth daily.  . simvastatin (ZOCOR) 40 MG tablet Take 40 mg by mouth every evening.   . traMADol (ULTRAM) 50 MG tablet Take 50 mg by mouth every 4 (four) hours as needed.  . [DISCONTINUED] acetaminophen (TYLENOL) 500 MG tablet Take 500 mg by mouth every 8 (eight) hours as needed.   . [DISCONTINUED] hydrocortisone (ANUSOL-HC) 2.5 % rectal cream Place 1 application rectally 2 (two) times daily.   . [DISCONTINUED] ibuprofen (ADVIL,MOTRIN) 400 MG tablet Take 400 mg by mouth every 6 (six) hours as needed.  . [DISCONTINUED] nystatin (NYSTATIN) powder Apply topically. Apply to right and left buttocks twice a day   No facility-administered encounter medications on file as of 10/01/2016.   :  Review of Systems:  Out of a complete 14 point review of systems, all are reviewed and negative with the exception of these symptoms as listed below: Review of Systems  Neurological:       Pt presents today to discuss his memory. Pt reports that he is doing well. Pt's wife reports that pt suffered a fall in March of 2018 that fractured his left leg in several places.    Objective:  Neurological Exam  Physical Exam Physical Examination:   Vitals:   10/01/16 1013  BP: (!) 150/83  Pulse: 86   General Examination: The patient is a very pleasant 83 y.o. male in no acute distress. He appears frail and deconditioned, somewhat sleepy.   HEENT: Normocephalic, atraumatic, pupils are equal, round and reactive to light and accommodation. Extraocular tracking shows mild saccadic breakdown without nystagmus noted. Hearing is intact. Tympanic membranes are clear bilaterally. Face is symmetric with no facial masking and normal facial sensation. There is no lip, neck or jaw tremor. Neck is not rigid with intact passive ROM. There are no carotid bruits on  auscultation. Oropharynx exam reveals mild to moderate mouth dryness. No significant airway crowding is noted. Mallampati is class I. Tongue protrudes centrally and palate elevates symmetrically.    Chest: is clear to auscultation without wheezing, rhonchi or crackles noted.  Heart: sounds are regular and normal without murmurs, rubs or gallops noted.   Abdomen: is soft, non-tender and non-distended with normal bowel sounds appreciated on auscultation.  Extremities: There is 1+ pitting edema in the distal lower extremities.  Skin: is warm and dry with no trophic changes noted. He has multiple scars on his scalp. He has precancerous lesions which are usually taken off by his dermatologist.  Musculoskeletal: exam reveals no obvious joint deformities, tenderness or joint swelling or erythema.  Neurologically:  Mental status: The patient is awake and alert, paying fair attention. He is able to partially provide the history. His wife provides most of his Hx. He is oriented to: person, place and day of week. His memory, attention, language and knowledge are impaired. There is no aphasia, agnosia, apraxia or anomia. There is a mild degree of bradyphrenia. Speech is mildly hypophonic with no dysarthria noted. Mood is congruent and affect is normal.   On 11/12/12: His MMSE score was 23/30. CDT was 4/4. AFT (Animal Fluency Test) score was 15.  On 05/14/13: MMSE was 19/30, CDT 4/4, AFT 15. On 11/12/2013: MMSE is 20/30, CDT 4/4, AFT 16.  On 07/16/2014: MMSE: 20/30, CDT: 4/4, AFT: 16/min. On 01/20/2015: MMSE: 20/30, CDT: 3/4, AFT: 12/min. On 07/21/2015: MMSE: 20/30, CDT: 4/4, AFT: 9/min. On 01/19/2016: MMSE: 21/30, CDT: 3/4, AFT: 9/min.  On 10/01/2016: MMSE: 19/30, CDT: 4/4, AFT: 6/min.  Cranial nerves are as described above under HEENT exam. In addition, shoulder shrug is normal with equal shoulder height noted.  Motor exam:  Thin bulk, global strength of 4 out of 5, no cogwheeling noted, no  increase in tone. No resting tremor. Reflexes are 1+ in the upper extremities and trace in the lower extremities, fine motor skills mild to moderately impaired globally.  Romberg is not testable safely today.   Sensory exam is intact to light touch.   Gait, station and balance: He is not able to stand or walk. In [place].    Assessment and Plan:   In summary, KAELUM KISSICK is a very pleasant 83 year old male with an underlying medical history of chronic back pain, arthritis, depression, reflux disease, and hypertension who presents for follow-up consultation of his long-standing history of dementia without behavioral disturbance. He was diagnosed over 17 years ago or so. He has had no problems tolerating his medications, is on dual medication with Namenda XR 28 mg daily and generic Aricept 23 mg daily.  Unfortunately, he fell in March and fractured his left hip, this was a significant setback for him, he continues to be in skilled nursing at [facility]. I think he is better off staying at skilled nursing as opposed to going home which is independent living because his wife is not going to be able to provide the care and supervision that he needs at this time. They did not need any refills today but I did encourage his wife to think longer-term discuss this also with her children but ultimately, she will have to make a decision on his behalf. He is fully supervised at this time, is getting physical activity and mental stimulation at skilled nursing and regular administration of his meals and his medications. His memory scores have fluctuated within the same realm for the past at least 3-4 years even. I suggested 6 month follow-up. Both his daughter and his son live close by and help out, thankfully. They have grandchildren in [place] and [place]. Thankfully he has a very caring and involved family and a good support system overall and such a dedicated spouse! I encouraged his wife to call  with any interim questions, concerns, problems, updates, or refill requests. I spent 30 minutes in total face-to-face time with the patient, more than 50% of which was spent in counseling and coordination of care, reviewing test results, reviewing medication and discussing or reviewing the diagnosis of dementia, its prognosis and treatment options. Pertinent laboratory and imaging test results that were available during this visit with the patient were reviewed by me and considered in my medical decision making (see chart for details).

## 2016-10-09 ENCOUNTER — Encounter: Payer: Self-pay | Admitting: Nurse Practitioner

## 2016-10-09 ENCOUNTER — Non-Acute Institutional Stay (SKILLED_NURSING_FACILITY): Payer: Medicare Other | Admitting: Nurse Practitioner

## 2016-10-09 DIAGNOSIS — G309 Alzheimer's disease, unspecified: Secondary | ICD-10-CM | POA: Diagnosis not present

## 2016-10-09 DIAGNOSIS — E119 Type 2 diabetes mellitus without complications: Secondary | ICD-10-CM

## 2016-10-09 DIAGNOSIS — F418 Other specified anxiety disorders: Secondary | ICD-10-CM | POA: Diagnosis not present

## 2016-10-09 DIAGNOSIS — F0280 Dementia in other diseases classified elsewhere without behavioral disturbance: Secondary | ICD-10-CM | POA: Diagnosis not present

## 2016-10-09 NOTE — Assessment & Plan Note (Signed)
Mood is stable, continue Wellbutrin.

## 2016-10-09 NOTE — Assessment & Plan Note (Signed)
elevated CBGs from 133-199mg /dl fasting in morning, last Hgb a1c 6.7 09/26/16, currently taking Metformin 500mg  qam. Will increase Metformin 500mg  bid po/500mg  po qd. Monitor CBGs

## 2016-10-09 NOTE — Progress Notes (Signed)
Location:  [facility] Room Number: 44 Place of Service:  SNF (31) Provider:  Merryl Buckels, Manxie   NP  [name], MD  Patient Care Team: [name], MD as PCP - General (Internal Medicine) Rashidah Belleville X, NP as Nurse Practitioner (Internal Medicine)  Extended Emergency Contact Information Primary Emergency Contact: [name] Address: [address] [place], [place] Phone: [phone] Mobile Phone: [phone] Relation: Spouse Secondary Emergency Contact: [name] [place] of [place] Phone: [phone] Work Phone: [phone] Relation: Daughter  Code Status:  Full Code Goals of care: Advanced Directive information Advanced Directives 10/09/2016  Does Patient Have a Medical Advance Directive? No  Type of Advance Directive -  Does patient want to make changes to medical advance directive? No - Patient declined  Would patient like information on creating a medical advance directive? -  Pre-existing out of facility DNR order (yellow form or pink MOST form) Physician notified to receive inpatient order     Chief Complaint  Patient presents with  . Acute Visit    Elevated CBG's    HPI:  Pt is a 83 y.o. male seen today for an acute visit for elevated CBGs from 133-199mg /dl fasting in morning, last Hgb a1c 6.7 09/26/16, currently taking Metformin 500mg  qam.   Hx of depression/anxiety, mood is stable, takign Wellbutrin 150mg  bid. Hx of dementia, f/u Neurology 10/01/16, MMSE about the same 21/30, continued Namenda and Aricept   Past Medical History:  Diagnosis Date  . Abnormality of gait   . Backache, unspecified   . Colon polyps    adenomatous  . Dementia 11/12/2012  . Diverticulosis   . Essential and other specified forms of tremor   . Hemorrhoids   . History of bilateral hip replacements 11/12/2012  . Memory loss   . Other persistent mental disorders due to conditions classified elsewhere     . Pain in joint, pelvic region and thigh   . Skin cancer of scalp   . Spinal stenosis, lumbar region, without neurogenic claudication   . Unspecified hereditary and idiopathic peripheral neuropathy    Past Surgical History:  Procedure Laterality Date  . CATARACT EXTRACTION, BILATERAL  2012  . RETINAL LASER PROCEDURE  2012   retinal wrinkle  . SKIN CANCER EXCISION    . TOTAL HIP ARTHROPLASTY Left 2000  . TOTAL HIP ARTHROPLASTY (aka REPLACEMENT) Right 2011    Allergies  Allergen Reactions  . Axona [Flora-Q]     Pt reported  . Gabapentin     Pt reported    Outpatient Encounter Prescriptions as of 10/09/2016  Medication Sig  . acetaminophen (TYLENOL) 325 MG tablet Take 650 mg by mouth every 4 (four) hours as needed for fever.  Marland Kitchen buPROPion (WELLBUTRIN SR) 150 MG 12 hr tablet Take 150 mg by mouth daily.   . Cholecalciferol (VITAMIN D) 1000 UNITS capsule Take 1,000 Units by mouth daily.    Marland Kitchen dextromethorphan (DELSYM) 30 MG/5ML liquid Take 30 mg by mouth at bedtime as needed for cough.  . donepezil (ARICEPT) 23 MG TABS tablet Take 1 tablet (23 mg total) by mouth daily.  . dorzolamide-timolol (COSOPT) 22.3-6.8 MG/ML ophthalmic solution Place 1 drop into the left eye 2 (two) times daily.   . Flaxseed, Linseed, (FLAXSEED OIL) 1000 MG CAPS Take 1,000 mg by mouth daily.    . folic acid (FOLVITE) 093 MCG tablet Take 800 mcg by mouth every evening.   Marland Kitchen  hydrocortisone 2.5 % cream Apply 1 application topically at bedtime as needed (apply to rectal area).  . loperamide (IMODIUM A-D) 2 MG tablet Take 2 mg by mouth 4 (four) times daily as needed for diarrhea or loose stools.   . memantine (NAMENDA XR) 28 MG CP24 24 hr capsule Take 1 capsule (28 mg total) by mouth daily.  . metFORMIN (GLUCOPHAGE) 500 MG tablet Take 500 mg by mouth daily with breakfast.   . mirabegron ER (MYRBETRIQ) 50 MG TB24 tablet Take 50 mg by mouth daily.  . Multiple Vitamin (MULTIVITAMIN) tablet Take 1 tablet by mouth daily.     . potassium chloride (KLOR-CON) 10 MEQ CR tablet Take 10 mEq by mouth 2 (two) times a week.    . Probiotic Product (ALIGN) 4 MG CAPS Take 4 mg by mouth daily.  . simvastatin (ZOCOR) 40 MG tablet Take 40 mg by mouth every evening.   . traMADol (ULTRAM) 50 MG tablet Take 50 mg by mouth every 4 (four) hours as needed.  . [DISCONTINUED] hydrocortisone 1 % lotion Apply 1 application topically 2 (two) times daily.   No facility-administered encounter medications on file as of 10/09/2016.     Review of Systems  Constitutional: Negative for activity change, fatigue and unexpected weight change.       ROS was provided with assistance of staff  HENT: Positive for hearing loss. Negative for congestion, drooling, ear pain and mouth sores.   Eyes: Negative for pain, discharge, redness, itching and visual disturbance.  Respiratory: Negative for cough, chest tightness and shortness of breath.   Cardiovascular: Negative for chest pain, palpitations and leg swelling.  Gastrointestinal: Negative for abdominal distention, abdominal pain and constipation.       External hemorrhoids, no bleeding or inflammation, prn steroid cream use.   Endocrine: Negative for cold intolerance, heat intolerance, polydipsia and polyphagia.  Genitourinary: Positive for frequency. Negative for dysuria and urgency.  Musculoskeletal: Positive for arthralgias and gait problem. Negative for joint swelling.       Left hip fx, healed  Skin: Negative for color change and rash.          Allergic/Immunologic: Negative for food allergies.  Neurological: Negative for dizziness, syncope, facial asymmetry, speech difficulty, numbness and headaches.       Dementia  Hematological: Negative for adenopathy. Does not bruise/bleed easily.       Chronic thrombocytopenia.  Psychiatric/Behavioral: Positive for confusion. Negative for agitation, behavioral problems, decreased concentration, dysphoric mood (hx of depression and anxiety.),  hallucinations and sleep disturbance. The patient is not nervous/anxious.     Immunization History  Administered Date(s) Administered  . PPD Test 06/24/2016   Pertinent  Health Maintenance Due  Topic Date Due  . URINE MICROALBUMIN  04/02/2017 (Originally 06/06/1940)  . PNA vac Low Risk Adult (1 of 2 - PCV13) 04/02/2017 (Originally 06/05/1997)  . FOOT EXAM  09/28/2017 (Originally 06/06/1940)  . OPHTHALMOLOGY EXAM  09/28/2017 (Originally 06/06/1940)  . INFLUENZA VACCINE  10/31/2016  . HEMOGLOBIN A1C  03/27/2017   Fall Risk  01/19/2016 07/21/2015  Falls in the past year? Yes Yes  Number falls in past yr: 1 1  Injury with Fall? No No  Risk for fall due to : Impaired balance/gait -  Follow up Falls prevention discussed Falls prevention discussed   Functional Status Survey:    Vitals:   10/09/16 1234  BP: 140/80  Pulse: 78  Resp: 18  Temp: 97.7 F (36.5 C)  Weight: 184 lb 4.8 oz (83.6 kg)  Height: 5\' 9"  (1.753 m)   Body mass index is 27.22 kg/m. Physical Exam  Constitutional: He appears well-developed and well-nourished.  HENT:  Head: Normocephalic and atraumatic.  Eyes: Conjunctivae and EOM are normal. Pupils are equal, round, and reactive to light.  Neck: Normal range of motion. Neck supple. No thyromegaly present.  Cardiovascular: Normal rate and regular rhythm.   Murmur heard. Soft 2/6 systolic murmur  Pulmonary/Chest: Effort normal and breath sounds normal. No respiratory distress. He has no wheezes.  Abdominal: Soft. Bowel sounds are normal. He exhibits no distension. There is no tenderness.  Hx of external hemorrhoids x2  Musculoskeletal: He exhibits no edema or tenderness.  Bilateral hip replacements  Neurological: He is alert. He has normal reflexes. No cranial nerve deficit. Coordination normal.  Skin: Skin is warm and dry. No rash noted. No erythema. No pallor.     Psychiatric: He has a normal mood and affect. His behavior is normal. Thought content normal.     Labs reviewed:  Recent Labs  03/16/16 1201 06/22/16 1057 06/23/16 0430 06/26/16 09/25/16  NA 139 138 138 139 138  K 4.0 3.9 3.7 3.7 4.0  CL 106 105 106  --   --   CO2 25 26 25   --   --   GLUCOSE 164* 147* 151*  --   --   BUN 27* 17 23* 19 22*  CREATININE 1.00 0.98 1.11 0.9 1.2  CALCIUM 9.2 9.3 8.4*  --   --     Recent Labs  03/16/16 1201 06/23/16 0430 06/26/16 09/25/16  AST 22 18 17 19   ALT 27 23 20 27   ALKPHOS 77 66 59 75  BILITOT 0.9 0.7  --   --   PROT 7.0 5.8*  --   --   ALBUMIN 4.0 3.2*  --   --     Recent Labs  03/16/16 1201 06/22/16 1057 06/23/16 0430 06/26/16 09/25/16  WBC 13.2* 18.7* 10.3 9.4 8.2  NEUTROABS  --  16.0*  --   --   --   HGB 15.2 15.0 12.3* 11.3* 13.0*  HCT 44.2 43.9 36.3* 33* 37*  MCV 97.6 96.9 98.1  --   --   PLT 124* 130* 113* 117* 127*   Lab Results  Component Value Date   TSH 2.40 06/26/2016   Lab Results  Component Value Date   HGBA1C 6.7 09/25/2016   Lab Results  Component Value Date   CHOL 118 08/16/2016   HDL 53 08/16/2016   LDLCALC 50 08/16/2016   TRIG 66 08/16/2016    Significant Diagnostic Results in last 30 days:  No results found.  Assessment/Plan Controlled type 2 diabetes mellitus without complication, without long-term current use of insulin (HCC) elevated CBGs from 133-199mg /dl fasting in morning, last Hgb a1c 6.7 09/26/16, currently taking Metformin 500mg  qam. Will increase Metformin 500mg  bid po/500mg  po qd. Monitor CBGs  Alzheimer's dementia without behavioral disturbance f/u Neurology 10/01/16, MMSE about the same 21/30, continued Namenda and Aricept. May transition from SNF to [place] @[facility]    Depression with anxiety Mood is stable, continue Wellbutrin.      Family/ staff Communication: may IL when able.   Labs/tests ordered: none

## 2016-10-09 NOTE — Assessment & Plan Note (Signed)
f/u Neurology 10/01/16, MMSE about the same 21/30, continued Namenda and Aricept. May transition from SNF to [place] @[facility] 

## 2016-10-24 ENCOUNTER — Non-Acute Institutional Stay (SKILLED_NURSING_FACILITY): Payer: Medicare Other | Admitting: Nurse Practitioner

## 2016-10-24 ENCOUNTER — Encounter: Payer: Self-pay | Admitting: Nurse Practitioner

## 2016-10-24 DIAGNOSIS — N182 Chronic kidney disease, stage 2 (mild): Secondary | ICD-10-CM | POA: Diagnosis not present

## 2016-10-24 DIAGNOSIS — R350 Frequency of micturition: Secondary | ICD-10-CM | POA: Diagnosis not present

## 2016-10-24 DIAGNOSIS — D696 Thrombocytopenia, unspecified: Secondary | ICD-10-CM

## 2016-10-24 DIAGNOSIS — F0280 Dementia in other diseases classified elsewhere without behavioral disturbance: Secondary | ICD-10-CM

## 2016-10-24 DIAGNOSIS — F418 Other specified anxiety disorders: Secondary | ICD-10-CM | POA: Diagnosis not present

## 2016-10-24 DIAGNOSIS — E1122 Type 2 diabetes mellitus with diabetic chronic kidney disease: Secondary | ICD-10-CM | POA: Diagnosis not present

## 2016-10-24 DIAGNOSIS — G309 Alzheimer's disease, unspecified: Secondary | ICD-10-CM

## 2016-10-24 NOTE — Assessment & Plan Note (Signed)
f/u Neurology 10/01/16, MMSE about the same 21/30, continued Namenda and Aricept

## 2016-10-24 NOTE — Assessment & Plan Note (Signed)
Brief, continue Myrbetriq

## 2016-10-24 NOTE — Assessment & Plan Note (Signed)
Continue Metformin 500mg  bid, last Hgb a1c 6.7 09/26/16

## 2016-10-24 NOTE — Progress Notes (Signed)
Location:  [facility] Room Number: 56 Place of Service:  SNF (31) Provider:  Keldric Poyer, Manxie  NP  [name], MD  Patient Care Team: [name], MD as PCP - General (Internal Medicine) Brittan Mapel X, NP as Nurse Practitioner (Internal Medicine)  Extended Emergency Contact Information Primary Emergency Contact: [name] Address: [place] [place], [place] of [place] Phone: [phone] Mobile Phone: [phone] Relation: Spouse Secondary Emergency Contact: [name] [place] of [place] Phone: [phone] Work Phone: [phone] Relation: Daughter  Code Status:  Full Code Goals of care: Advanced Directive information Advanced Directives 10/24/2016  Does Patient Have a Medical Advance Directive? No  Type of Advance Directive -  Does patient want to make changes to medical advance directive? No - Patient declined  Would patient like information on creating a medical advance directive? -  Pre-existing out of facility DNR order (yellow form or pink MOST form) -     Chief Complaint  Patient presents with  . Medical Management of Chronic Issues    F/u Diabetes    HPI:  Pt is a 83 y.o. male seen today for medical management of chronic diseases.                   Hx of depression/anxiety, mood is stable, takign Wellbutrin 150mg  bid. Dementia, f/u Neurology 10/01/16, MMSE about the same 21/30, continued Namenda and Aricept. Last Hgb a1c 6.7 09/26/16, currently taking Metformin 500mg  bid. He takes Myrbetriq 50mg  qd for    Past Medical History:  Diagnosis Date  . Abnormality of gait   . Backache, unspecified   . Colon polyps    adenomatous  . Dementia 11/12/2012  . Diverticulosis   . Essential and other specified forms of tremor   . Hemorrhoids   . History of bilateral hip replacements 11/12/2012  . Memory loss   . 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 Pain  in joint, pelvic region and thigh   . Skin cancer of scalp   . Spinal stenosis, lumbar region, without neurogenic claudication   . Unspecified hereditary and idiopathic peripheral neuropathy    Past Surgical History:  Procedure Laterality Date  . CATARACT EXTRACTION, BILATERAL  2012  . RETINAL LASER PROCEDURE  2012   retinal wrinkle  . SKIN CANCER EXCISION    . TOTAL HIP ARTHROPLASTY Left 2000  . TOTAL HIP ARTHROPLASTY (aka REPLACEMENT) Right 2011    Allergies  Allergen Reactions  . Axona [Flora-Q]     Pt reported  . Gabapentin     Pt reported    Outpatient Encounter Prescriptions as of 10/24/2016  Medication Sig  . acetaminophen (TYLENOL) 325 MG tablet Take 650 mg by mouth every 4 (four) hours as needed for fever.  Marland Kitchen buPROPion (WELLBUTRIN SR) 150 MG 12 hr tablet Take 150 mg by mouth daily.   . Cholecalciferol (VITAMIN D) 1000 UNITS capsule Take 1,000 Units by mouth daily.    Marland Kitchen dextromethorphan (DELSYM) 30 MG/5ML liquid Take 30 mg by mouth at bedtime as needed for cough.  . donepezil (ARICEPT) 23 MG TABS tablet Take 1 tablet (23 mg total) by mouth daily.  . dorzolamide-timolol (COSOPT) 22.3-6.8 MG/ML ophthalmic solution Place 1 drop into the left eye 2 (two) times daily.   . Flaxseed, Linseed, (FLAXSEED OIL) 1000 MG CAPS Take 1,000 mg by mouth daily.    . folic acid (FOLVITE)  400 MCG tablet Take 800 mcg by mouth every evening.   . hydrocortisone 2.5 % cream Apply 1 application topically at bedtime as needed (apply to rectal area).  . hydrocortisone cream 1 % Apply 1 application topically 2 (two) times daily.  Marland Kitchen loperamide (IMODIUM A-D) 2 MG tablet Take 2 mg by mouth 4 (four) times daily as needed for diarrhea or loose stools.   . memantine (NAMENDA XR) 28 MG CP24 24 hr capsule Take 1 capsule (28 mg total) by mouth daily.  . metFORMIN (GLUCOPHAGE) 500 MG tablet Take 500 mg by mouth daily with breakfast.   . mirabegron ER (MYRBETRIQ) 50 MG TB24 tablet Take 50 mg by mouth daily.  .  Multiple Vitamin (MULTIVITAMIN) tablet Take 1 tablet by mouth daily.    Marland Kitchen nystatin (NYSTATIN) powder Apply topically 2 (two) times daily. Apply to right and left buttocks.  . potassium chloride (KLOR-CON) 10 MEQ CR tablet Take 10 mEq by mouth 2 (two) times a week.    . Probiotic Product (ALIGN) 4 MG CAPS Take 4 mg by mouth daily.  . simvastatin (ZOCOR) 40 MG tablet Take 40 mg by mouth every evening.   . traMADol (ULTRAM) 50 MG tablet Take 50 mg by mouth every 4 (four) hours as needed.   No facility-administered encounter medications on file as of 10/24/2016.     Review of Systems  Constitutional: Negative for activity change and fatigue.       ROS was provided with assistance of staff  HENT: Positive for hearing loss. Negative for congestion and mouth sores.   Eyes: Negative for redness and visual disturbance.  Respiratory: Negative for cough, chest tightness and shortness of breath.   Cardiovascular: Negative for chest pain, palpitations and leg swelling.  Gastrointestinal: Negative for abdominal distention, abdominal pain and constipation.       External hemorrhoids, no bleeding or inflammation, prn steroid cream use.   Endocrine: Negative for polyphagia.  Genitourinary: Positive for frequency. Negative for dysuria and urgency.  Musculoskeletal: Positive for arthralgias and gait problem. Negative for joint swelling.       Left hip fx, healed  Skin: Negative for rash.          Neurological: Negative for dizziness, speech difficulty and numbness.       Dementia  Hematological: Does not bruise/bleed easily.       Chronic thrombocytopenia.  Psychiatric/Behavioral: Positive for confusion. Negative for agitation and behavioral problems. Dysphoric mood: hx of depression and anxiety.    Immunization History  Administered Date(s) Administered  . PPD Test 06/24/2016   Pertinent  Health Maintenance Due  Topic Date Due  . URINE MICROALBUMIN  04/02/2017 (Originally 06/06/1940)  . PNA vac  Low Risk Adult (1 of 2 - PCV13) 04/02/2017 (Originally 06/05/1997)  . FOOT EXAM  09/28/2017 (Originally 06/06/1940)  . OPHTHALMOLOGY EXAM  09/28/2017 (Originally 06/06/1940)  . INFLUENZA VACCINE  10/31/2016  . HEMOGLOBIN A1C  03/27/2017   Fall Risk  01/19/2016 07/21/2015  Falls in the past year? Yes Yes  Number falls in past yr: 1 1  Injury with Fall? No No  Risk for fall due to : Impaired balance/gait -  Follow up Falls prevention discussed Falls prevention discussed   Functional Status Survey:    Vitals:   10/24/16 1136  BP: 132/76  Pulse: 78  Resp: 18  Temp: 97.7 F (36.5 C)  Weight: 184 lb 4.8 oz (83.6 kg)  Height: 5\' 9"  (1.753 m)   Body mass index is 27.[place]  kg/m. Physical Exam  Constitutional: He appears well-developed and well-nourished.  HENT:  Head: Normocephalic and atraumatic.  Eyes: Pupils are equal, round, and reactive to light. Conjunctivae and EOM are normal.  Neck: Normal range of motion. Neck supple. No thyromegaly present.  Cardiovascular: Normal rate and regular rhythm.   Murmur heard. Soft 2/6 systolic murmur  Pulmonary/Chest: Effort normal and breath sounds normal. No respiratory distress. He has no wheezes.  Abdominal: Soft. Bowel sounds are normal. He exhibits no distension. There is no tenderness.  Hx of external hemorrhoids x2  Musculoskeletal: He exhibits no edema or tenderness.  Bilateral hip replacements  Neurological: He is alert. He has normal reflexes. No cranial nerve deficit. Coordination normal.  Skin: Skin is warm and dry. No rash noted. No erythema.     Psychiatric: He has a normal mood and affect. His behavior is normal.    Labs reviewed:  Recent Labs  03/16/16 1201 06/22/16 1057 06/23/16 0430 06/26/16 09/25/16  NA 139 138 138 139 138  K 4.0 3.9 3.7 3.7 4.0  CL 106 105 106  --   --   CO2 25 26 25   --   --   GLUCOSE 164* 147* 151*  --   --   BUN 27* 17 23* 19 22*  CREATININE 1.00 0.98 1.11 0.9 1.2  CALCIUM 9.2 9.3 8.4*  --   --      Recent Labs  03/16/16 1201 06/23/16 0430 06/26/16 09/25/16  AST 22 18 17 19   ALT 27 23 20 27   ALKPHOS 77 66 59 75  BILITOT 0.9 0.7  --   --   PROT 7.0 5.8*  --   --   ALBUMIN 4.0 3.2*  --   --     Recent Labs  03/16/16 1201 06/22/16 1057 06/23/16 0430 06/26/16 09/25/16  WBC 13.2* 18.7* 10.3 9.4 8.2  NEUTROABS  --  16.0*  --   --   --   HGB 15.2 15.0 12.3* 11.3* 13.0*  HCT 44.2 43.9 36.3* 33* 37*  MCV 97.6 96.9 98.1  --   --   PLT 124* 130* 113* 117* 127*   Lab Results  Component Value Date   TSH 2.40 06/26/2016   Lab Results  Component Value Date   HGBA1C 6.7 09/25/2016   Lab Results  Component Value Date   CHOL 118 08/16/2016   HDL 53 08/16/2016   LDLCALC 50 08/16/2016   TRIG 66 08/16/2016    Significant Diagnostic Results in last 30 days:  No results found.  Assessment/Plan Type 2 diabetes mellitus with diabetic chronic kidney disease ([place]) Continue Metformin 500mg  bid, last Hgb a1c 6.7 09/26/16  Alzheimer's dementia without behavioral disturbance f/u Neurology 10/01/16, MMSE about the same 21/30, continued Namenda and Aricept  Urinary frequency Brief, continue Myrbetriq  Depression with anxiety Mood is stable, continue Wellbutrin 150mg  qd  Thrombocytopenia (HCC) Last Plt 127 09/26/16     Family/ staff Communication: SNF  Labs/tests ordered:  none

## 2016-10-24 NOTE — Assessment & Plan Note (Signed)
Mood is stable, continue Wellbutrin 150mg qd.  

## 2016-10-24 NOTE — Assessment & Plan Note (Signed)
Last Plt 127 09/26/16

## 2016-11-13 ENCOUNTER — Ambulatory Visit (INDEPENDENT_AMBULATORY_CARE_PROVIDER_SITE_OTHER): Payer: Medicare Other | Admitting: Podiatry

## 2016-11-13 ENCOUNTER — Encounter: Payer: Self-pay | Admitting: Podiatry

## 2016-11-13 DIAGNOSIS — B351 Tinea unguium: Secondary | ICD-10-CM | POA: Diagnosis not present

## 2016-11-13 DIAGNOSIS — I739 Peripheral vascular disease, unspecified: Secondary | ICD-10-CM

## 2016-11-13 DIAGNOSIS — M79676 Pain in unspecified toe(s): Secondary | ICD-10-CM

## 2016-11-13 DIAGNOSIS — I878 Other specified disorders of veins: Secondary | ICD-10-CM

## 2016-11-13 NOTE — Progress Notes (Signed)
Patient ID: Jose [name], male   DOB: 04/18/1930, 83 y.o.   MRN: [id number] Complaint:  Visit Type: Patient returns to my office for continued preventative foot care services. Complaint: Patient states" my nails have grown long and thick and become painful to walk and wear shoes" . He presents for preventative foot care services. Patient has fallen and now wears compression socks.  Podiatric Exam: Vascular: dorsalis pedis and posterior tibial pulses are not  palpable bilateral. Capillary return is diminished. Temperature gradient is diminished. Skin turgor  diminished .  Purplish discoloration feet  B/LSensorium: Normal Semmes Weinstein monofilament test. Normal tactile sensation bilaterally. Nail Exam: Pt has thick disfigured discolored nails with subungual debris noted bilateral entire nail hallux through fifth toenails Ulcer Exam: There is no evidence of ulcer or pre-ulcerative changes or infection. Orthopedic Exam: Muscle tone and strength are WNL. No limitations in general ROM. No crepitus or effusions noted. Foot type and digits show no abnormalities. Bony prominences are unremarkable. Skin: No Porokeratosis. No infection or ulcers  Diagnosis:  Tinea unguium, Pain in right toe, pain in left toes, PVD  Treatment & Plan Procedures and Treatment: Consent by patient was obtained for treatment procedures. The patient understood the discussion of treatment and procedures well. All questions were answered thoroughly reviewed. Debridement of mycotic and hypertrophic toenails, 1 through 5 bilateral and clearing of subungual debris. No ulceration, no infection noted.  Return Visit-Office Procedure: Patient instructed to return to the office for a follow up visit 3 months for continued evaluation and treatment.  Rafferty Postlewait DPM 

## 2016-11-23 ENCOUNTER — Encounter: Payer: Self-pay | Admitting: Internal Medicine

## 2016-11-23 ENCOUNTER — Non-Acute Institutional Stay (SKILLED_NURSING_FACILITY): Payer: Medicare Other | Admitting: Internal Medicine

## 2016-11-23 DIAGNOSIS — E785 Hyperlipidemia, unspecified: Secondary | ICD-10-CM

## 2016-11-23 DIAGNOSIS — F339 Major depressive disorder, recurrent, unspecified: Secondary | ICD-10-CM | POA: Diagnosis not present

## 2016-11-23 DIAGNOSIS — A4901 Methicillin susceptible Staphylococcus aureus infection, unspecified site: Secondary | ICD-10-CM

## 2016-11-23 DIAGNOSIS — N182 Chronic kidney disease, stage 2 (mild): Secondary | ICD-10-CM

## 2016-11-23 DIAGNOSIS — N3281 Overactive bladder: Secondary | ICD-10-CM

## 2016-11-23 DIAGNOSIS — E1122 Type 2 diabetes mellitus with diabetic chronic kidney disease: Secondary | ICD-10-CM | POA: Diagnosis not present

## 2016-11-23 NOTE — Progress Notes (Signed)
Location:  [facility] Room Number: 44 Place of Service:  SNF (31) Provider:  [name] MD  [name], MD  Patient Care Team: [name], MD as PCP - General (Internal Medicine) Mast, Man X, NP as Nurse Practitioner (Internal Medicine)  Extended Emergency Contact Information Primary Emergency Contact: [name] Address: [place], [place] Phone: [phone] Mobile Phone: [phone] Relation: Spouse Secondary Emergency Contact: [name] [place] Phone: [phone] Work Phone: [phone] Relation: Daughter  Code Status:  Full Code Goals of care: Advanced Directive information Advanced Directives 10/24/2016  Does Patient Have a Medical Advance Directive? No  Type of Advance Directive -  Does patient want to make changes to medical advance directive? No - Patient declined  Would patient like information on creating a medical advance directive? -  Pre-existing out of facility DNR order (yellow form or pink MOST form) -     Chief Complaint  Patient presents with  . Medical Management of Chronic Issues    Routine Visit     HPI:  Patient is a 83 y.o. [name] seen today for medical management of chronic diseases. HPI and ROS limited with his dementia. He denies any concern. He gets around with his wheelchair but has to be wheeled. He is currently on keflex for MRSA infection to his skin lesions on his scalp. Blood sugar reading stable. Blood pressure reading stable. Mood has been stable. Compliant with his medications.   Past Medical History:  Diagnosis Date  . Abnormality of gait   . Backache, unspecified   . Colon polyps    adenomatous  . Dementia 11/12/2012  . Diverticulosis   . Essential and other specified forms of tremor   . Hemorrhoids   . History of bilateral hip replacements 11/12/2012  . Memory loss   . Other persistent mental disorders due to  conditions classified elsewhere   . Pain in joint, pelvic region and thigh   . Skin cancer of scalp   . Spinal stenosis, lumbar region, without neurogenic claudication   . Unspecified hereditary and idiopathic peripheral neuropathy    Past Surgical History:  Procedure Laterality Date  . CATARACT EXTRACTION, BILATERAL  2012  . RETINAL LASER PROCEDURE  2012   retinal wrinkle  . SKIN CANCER EXCISION    . TOTAL HIP ARTHROPLASTY Left 2000  . TOTAL HIP ARTHROPLASTY (aka REPLACEMENT) Right 2011    Allergies  Allergen Reactions  . Axona [Flora-Q]     Pt reported  . Gabapentin     Pt reported    Outpatient Encounter Prescriptions as of 11/23/2016  Medication Sig  . acetaminophen (TYLENOL) 325 MG tablet Take 650 mg by mouth every 4 (four) hours as needed for fever.  Marland Kitchen acetaminophen (TYLENOL) 500 MG tablet Take 500 mg by mouth every 8 (eight) hours as needed.  Marland Kitchen buPROPion (WELLBUTRIN SR) 150 MG 12 hr tablet Take 150 mg by mouth daily.   . cephALEXin (KEFLEX) 500 MG capsule Take 500 mg by mouth 2 (two) times daily. Stop date 11/27/16  . Cholecalciferol (VITAMIN D) 1000 UNITS capsule Take 1,000 Units by mouth daily.    Marland Kitchen dextromethorphan (DELSYM) 30 MG/5ML liquid Take by mouth every 12 (twelve) hours as needed for cough. Take 10 mL  . donepezil (ARICEPT) 23 MG TABS tablet Take 1 tablet (23 mg total) by mouth daily.  . dorzolamide-timolol (COSOPT) 22.3-6.8 MG/ML ophthalmic  solution Place 1 drop into the left eye 2 (two) times daily.   . Emollient (CERAVE) CREA Apply 1 application topically daily. Apply to arms, legs and trunk  . Flaxseed, Linseed, (FLAXSEED OIL) 1000 MG CAPS Take 1,000 mg by mouth daily.    . folic acid (FOLVITE) 194 MCG tablet Take 800 mcg by mouth every evening.   . hydrocortisone 2.5 % cream Apply 1 application topically at bedtime as needed (apply to rectal area).  . hydrocortisone cream 1 % Apply 1 application topically 2 (two) times daily.  Marland Kitchen loperamide (IMODIUM A-D) 2  MG tablet Take 2 mg by mouth 4 (four) times daily as needed for diarrhea or loose stools.   . memantine (NAMENDA XR) 28 MG CP24 24 hr capsule Take 1 capsule (28 mg total) by mouth daily.  . metFORMIN (GLUCOPHAGE) 500 MG tablet Take 500 mg by mouth 2 (two) times daily with a meal.   . mirabegron ER (MYRBETRIQ) 50 MG TB24 tablet Take 50 mg by mouth daily.  . Multiple Vitamin (MULTIVITAMIN) tablet Take 1 tablet by mouth daily.    Marland Kitchen nystatin (NYSTATIN) powder Apply topically 2 (two) times daily. Apply to right and left buttocks.  . potassium chloride (KLOR-CON) 10 MEQ CR tablet Take 10 mEq by mouth 2 (two) times a week.    . Probiotic Product (ALIGN) 4 MG CAPS Take 4 mg by mouth daily.  . simvastatin (ZOCOR) 20 MG tablet Take 20 mg by mouth daily.  . traMADol (ULTRAM) 50 MG tablet Take 50 mg by mouth every 6 (six) hours as needed.   . [DISCONTINUED] simvastatin (ZOCOR) 40 MG tablet Take 40 mg by mouth every evening.    No facility-administered encounter medications on file as of 11/23/2016.     Review of Systems  Unable to perform ROS: Dementia  Constitutional: Negative for appetite change, chills, diaphoresis and fever.  HENT: Negative for drooling, mouth sores, rhinorrhea, sore throat and trouble swallowing.   Eyes:       Wears glasses  Respiratory: Negative for cough and shortness of breath.   Cardiovascular: Positive for leg swelling. Negative for chest pain.  Gastrointestinal: Negative for abdominal pain, nausea and vomiting.  Genitourinary: Negative for dysuria.  Musculoskeletal: Positive for gait problem. Negative for back pain.  Neurological: Negative for dizziness and seizures.  Psychiatric/Behavioral: Positive for confusion.    Immunization History  Administered Date(s) Administered  . PPD Test 06/24/2016   Pertinent  Health Maintenance Due  Topic Date Due  . INFLUENZA VACCINE  10/31/2016  . URINE MICROALBUMIN  04/02/2017 (Originally 06/06/1940)  . PNA vac Low Risk Adult (1  of 2 - PCV13) 04/02/2017 (Originally 06/05/1997)  . FOOT EXAM  09/28/2017 (Originally 06/06/1940)  . OPHTHALMOLOGY EXAM  09/28/2017 (Originally 06/06/1940)  . HEMOGLOBIN A1C  03/27/2017   Fall Risk  01/19/2016 07/21/2015  Falls in the past year? Yes Yes  Number falls in past yr: 1 1  Injury with Fall? No No  Risk for fall due to : Impaired balance/gait -  Follow up Falls prevention discussed Falls prevention discussed   Functional Status Survey:    Vitals:   11/23/16 1026  BP: 120/76  Pulse: 88  Resp: 18  Temp: 98 F (36.7 C)  TempSrc: Oral  Weight: 182 lb 1.6 oz (82.6 kg)  Height: 5\' 9"  (1.753 m)   Body mass index is 26.89 kg/m. Physical Exam  Constitutional: He appears well-developed and well-nourished. No distress.  HENT:  Head: Normocephalic and atraumatic.  Mouth/Throat: Oropharynx is clear and moist.  Eyes: Pupils are equal, round, and reactive to light. Conjunctivae are normal.  Has corrective glasses  Neck: Normal range of motion. Neck supple.  Cardiovascular: Normal rate and regular rhythm.   Pulmonary/Chest: Effort normal and breath sounds normal. He has no wheezes. He has no rales.  Abdominal: Soft. Bowel sounds are normal. There is no guarding.  Musculoskeletal:  Able to move all 4 extremities, trace leg edema, chronic skin changes to both legs  Lymphadenopathy:    He has no cervical adenopathy.  Neurological: He is alert.  Oriented to self, place and month this visit.   Skin: Skin is warm and dry. He is not diaphoretic.  Chronic stasis changes to lower extremities  Psychiatric: He has a normal mood and affect.  Pleasantly confused    Labs reviewed:  Recent Labs  03/16/16 1201 06/22/16 1057 06/23/16 0430 06/26/16 09/25/16  NA 139 138 138 139 138  K 4.0 3.9 3.7 3.7 4.0  CL 106 105 106  --   --   CO2 25 26 25   --   --   GLUCOSE 164* 147* 151*  --   --   BUN 27* 17 23* 19 22*  CREATININE 1.00 0.98 1.11 0.9 1.2  CALCIUM 9.2 9.3 8.4*  --   --      Recent Labs  03/16/16 1201 06/23/16 0430 06/26/16 09/25/16  AST 22 18 17 19   ALT 27 23 20 27   ALKPHOS 77 66 59 75  BILITOT 0.9 0.7  --   --   PROT 7.0 5.8*  --   --   ALBUMIN 4.0 3.2*  --   --     Recent Labs  03/16/16 1201 06/22/16 1057 06/23/16 0430 06/26/16 09/25/16  WBC 13.2* 18.7* 10.3 9.4 8.2  NEUTROABS  --  16.0*  --   --   --   HGB 15.2 15.0 12.3* 11.3* 13.0*  HCT 44.2 43.9 36.3* 33* 37*  MCV 97.6 96.9 98.1  --   --   PLT 124* 130* 113* 117* 127*   Lab Results  Component Value Date   TSH 2.40 06/26/2016   Lab Results  Component Value Date   HGBA1C 6.7 09/25/2016   Lab Results  Component Value Date   CHOL 118 08/16/2016   HDL 53 08/16/2016   LDLCALC 50 08/16/2016   TRIG 66 08/16/2016    Significant Diagnostic Results in last 30 days:  No results found.   Assessment/Plan  Staphylococcus aureus infection Continue cephalexin 500 mg bid until 11/27/16. Monitor skin area on scalp.   OAB Continue mirabegron 50 mg daily and monitor his symptom, perineal care  Hyperlipidemia LDL at goal, tolerating reduced dosing of simvastatin 20 mg daily well. Monitor clinically  Type 2 diabetes mellitus with renal complications Lab Results  Component Value Date   HGBA1C 6.7 09/25/2016   Currently on metformin 500 mg bid. cbg on review 110-130. With his age goal a1c <7. Decrease metformin to 500 mg daily and monitor cbg. Check a1c in 12/27/16  Chronic depression Stable mood. Currently on bupropion 150 mg daily. Decrease bupropion to 100 mg daily in attempt fr GDR and monitor.    Family/ staff Communication: reviewed care plan with patient and charge nurse.    Labs/tests ordered:  a1c  [name], MD Internal Medicine [facility] [address] [place], [place] [address] Cell Phone (Monday-Friday 8 am - 5 pm): [phone] On Call: [phone]  and follow prompts after 5 pm and on weekends Office Phone:  [phone] Office Fax: [phone]

## 2016-11-28 ENCOUNTER — Emergency Department (HOSPITAL_COMMUNITY): Payer: Medicare Other

## 2016-11-28 ENCOUNTER — Encounter (HOSPITAL_COMMUNITY): Payer: Self-pay | Admitting: Emergency Medicine

## 2016-11-28 ENCOUNTER — Observation Stay (HOSPITAL_COMMUNITY)
Admission: EM | Admit: 2016-11-28 | Discharge: 2016-11-30 | Disposition: A | Discharge status: Home / Self Care | Payer: Medicare Other | Attending: Internal Medicine | Admitting: Internal Medicine

## 2016-11-28 ENCOUNTER — Encounter: Payer: Self-pay | Admitting: Nurse Practitioner

## 2016-11-28 ENCOUNTER — Non-Acute Institutional Stay (SKILLED_NURSING_FACILITY): Payer: Medicare Other | Admitting: Nurse Practitioner

## 2016-11-28 DIAGNOSIS — Z86010 Personal history of colonic polyps: Secondary | ICD-10-CM | POA: Diagnosis not present

## 2016-11-28 DIAGNOSIS — N3281 Overactive bladder: Secondary | ICD-10-CM

## 2016-11-28 DIAGNOSIS — Z888 Allergy status to other drugs, medicaments and biological substances status: Secondary | ICD-10-CM | POA: Diagnosis not present

## 2016-11-28 DIAGNOSIS — J9601 Acute respiratory failure with hypoxia: Secondary | ICD-10-CM | POA: Diagnosis not present

## 2016-11-28 DIAGNOSIS — D72829 Elevated white blood cell count, unspecified: Secondary | ICD-10-CM | POA: Insufficient documentation

## 2016-11-28 DIAGNOSIS — Z7984 Long term (current) use of oral hypoglycemic drugs: Secondary | ICD-10-CM | POA: Diagnosis not present

## 2016-11-28 DIAGNOSIS — R7989 Other specified abnormal findings of blood chemistry: Secondary | ICD-10-CM | POA: Insufficient documentation

## 2016-11-28 DIAGNOSIS — I509 Heart failure, unspecified: Secondary | ICD-10-CM | POA: Diagnosis not present

## 2016-11-28 DIAGNOSIS — G608 Other hereditary and idiopathic neuropathies: Secondary | ICD-10-CM | POA: Diagnosis not present

## 2016-11-28 DIAGNOSIS — E876 Hypokalemia: Secondary | ICD-10-CM | POA: Insufficient documentation

## 2016-11-28 DIAGNOSIS — I700 Atherosclerosis of aorta: Secondary | ICD-10-CM | POA: Diagnosis not present

## 2016-11-28 DIAGNOSIS — Z87891 Personal history of nicotine dependence: Secondary | ICD-10-CM | POA: Diagnosis not present

## 2016-11-28 DIAGNOSIS — Z96643 Presence of artificial hip joint, bilateral: Secondary | ICD-10-CM | POA: Insufficient documentation

## 2016-11-28 DIAGNOSIS — F0280 Dementia in other diseases classified elsewhere without behavioral disturbance: Secondary | ICD-10-CM | POA: Insufficient documentation

## 2016-11-28 DIAGNOSIS — J9621 Acute and chronic respiratory failure with hypoxia: Secondary | ICD-10-CM | POA: Insufficient documentation

## 2016-11-28 DIAGNOSIS — R0602 Shortness of breath: Secondary | ICD-10-CM

## 2016-11-28 DIAGNOSIS — Z8719 Personal history of other diseases of the digestive system: Secondary | ICD-10-CM | POA: Diagnosis not present

## 2016-11-28 DIAGNOSIS — D696 Thrombocytopenia, unspecified: Secondary | ICD-10-CM | POA: Insufficient documentation

## 2016-11-28 DIAGNOSIS — I493 Ventricular premature depolarization: Secondary | ICD-10-CM | POA: Insufficient documentation

## 2016-11-28 DIAGNOSIS — Z85828 Personal history of other malignant neoplasm of skin: Secondary | ICD-10-CM | POA: Diagnosis not present

## 2016-11-28 DIAGNOSIS — E119 Type 2 diabetes mellitus without complications: Secondary | ICD-10-CM | POA: Diagnosis not present

## 2016-11-28 DIAGNOSIS — I5041 Acute combined systolic (congestive) and diastolic (congestive) heart failure: Secondary | ICD-10-CM | POA: Insufficient documentation

## 2016-11-28 DIAGNOSIS — F418 Other specified anxiety disorders: Secondary | ICD-10-CM | POA: Diagnosis not present

## 2016-11-28 DIAGNOSIS — N182 Chronic kidney disease, stage 2 (mild): Secondary | ICD-10-CM

## 2016-11-28 DIAGNOSIS — J90 Pleural effusion, not elsewhere classified: Secondary | ICD-10-CM | POA: Insufficient documentation

## 2016-11-28 DIAGNOSIS — E1122 Type 2 diabetes mellitus with diabetic chronic kidney disease: Secondary | ICD-10-CM

## 2016-11-28 DIAGNOSIS — I351 Nonrheumatic aortic (valve) insufficiency: Secondary | ICD-10-CM | POA: Insufficient documentation

## 2016-11-28 DIAGNOSIS — Z79899 Other long term (current) drug therapy: Secondary | ICD-10-CM | POA: Insufficient documentation

## 2016-11-28 DIAGNOSIS — R000 Tachycardia, unspecified: Secondary | ICD-10-CM | POA: Diagnosis not present

## 2016-11-28 DIAGNOSIS — J9811 Atelectasis: Secondary | ICD-10-CM | POA: Diagnosis not present

## 2016-11-28 DIAGNOSIS — G309 Alzheimer's disease, unspecified: Secondary | ICD-10-CM

## 2016-11-28 DIAGNOSIS — I110 Hypertensive heart disease with heart failure: Secondary | ICD-10-CM | POA: Diagnosis not present

## 2016-11-28 LAB — COMPREHENSIVE METABOLIC PANEL
ALT: 37 U/L (ref 17–63)
AST: 60 U/L — ABNORMAL HIGH (ref 15–41)
Albumin: 3.1 g/dL — ABNORMAL LOW (ref 3.5–5.0)
Alkaline Phosphatase: 73 U/L (ref 38–126)
Anion gap: 8 (ref 5–15)
BILIRUBIN TOTAL: 1.5 mg/dL — AB (ref 0.3–1.2)
BUN: 13 mg/dL (ref 6–20)
CHLORIDE: 105 mmol/L (ref 101–111)
CO2: 21 mmol/L — ABNORMAL LOW (ref 22–32)
CREATININE: 1.09 mg/dL (ref 0.61–1.24)
Calcium: 8.4 mg/dL — ABNORMAL LOW (ref 8.9–10.3)
Glucose, Bld: 234 mg/dL — ABNORMAL HIGH (ref 65–99)
POTASSIUM: 5.2 mmol/L — AB (ref 3.5–5.1)
Sodium: 134 mmol/L — ABNORMAL LOW (ref 135–145)
TOTAL PROTEIN: 5.9 g/dL — AB (ref 6.5–8.1)

## 2016-11-28 LAB — URINALYSIS, ROUTINE W REFLEX MICROSCOPIC
Bilirubin Urine: NEGATIVE
Glucose, UA: >=500 mg/dL — AB
Hgb urine dipstick: NEGATIVE
KETONES UR: NEGATIVE mg/dL
Leukocytes, UA: NEGATIVE
Nitrite: NEGATIVE
PH: 5 (ref 5.0–8.0)
Protein, ur: NEGATIVE mg/dL
SPECIFIC GRAVITY, URINE: 1.036 — AB (ref 1.005–1.030)

## 2016-11-28 LAB — CBC WITH DIFFERENTIAL/PLATELET
BASOS ABS: 0 10*3/uL (ref 0.0–0.1)
Basophils Relative: 0 %
EOS ABS: 0 10*3/uL (ref 0.0–0.7)
EOS PCT: 0 %
HCT: 40.4 % (ref 39.0–52.0)
HEMOGLOBIN: 14 g/dL (ref 13.0–17.0)
LYMPHS ABS: 1.6 10*3/uL (ref 0.7–4.0)
Lymphocytes Relative: 9 %
MCH: 32.7 pg (ref 26.0–34.0)
MCHC: 34.7 g/dL (ref 30.0–36.0)
MCV: 94.4 fL (ref 78.0–100.0)
Monocytes Absolute: 0.7 10*3/uL (ref 0.1–1.0)
Monocytes Relative: 4 %
NEUTROS PCT: 87 %
Neutro Abs: 14.9 10*3/uL — ABNORMAL HIGH (ref 1.7–7.7)
PLATELETS: 145 10*3/uL — AB (ref 150–400)
RBC: 4.28 MIL/uL (ref 4.22–5.81)
RDW: 13.2 % (ref 11.5–15.5)
WBC: 17.1 10*3/uL — AB (ref 4.0–10.5)

## 2016-11-28 LAB — CBC
HCT: 40.3 % (ref 39.0–52.0)
Hemoglobin: 13.8 g/dL (ref 13.0–17.0)
MCH: 32.3 pg (ref 26.0–34.0)
MCHC: 34.2 g/dL (ref 30.0–36.0)
MCV: 94.4 fL (ref 78.0–100.0)
PLATELETS: 155 10*3/uL (ref 150–400)
RBC: 4.27 MIL/uL (ref 4.22–5.81)
RDW: 13.2 % (ref 11.5–15.5)
WBC: 11.2 10*3/uL — AB (ref 4.0–10.5)

## 2016-11-28 LAB — BLOOD GAS, VENOUS
ACID-BASE DEFICIT: 2.2 mmol/L — AB (ref 0.0–2.0)
Bicarbonate: 22.7 mmol/L (ref 20.0–28.0)
O2 Saturation: 68.5 %
PH VEN: 7.355 (ref 7.250–7.430)
Patient temperature: 99
pCO2, Ven: 41.8 mmHg — ABNORMAL LOW (ref 44.0–60.0)
pO2, Ven: 39.1 mmHg (ref 32.0–45.0)

## 2016-11-28 LAB — D-DIMER, QUANTITATIVE (NOT AT ARMC): D DIMER QUANT: 1.33 ug{FEU}/mL — AB (ref 0.00–0.50)

## 2016-11-28 LAB — BRAIN NATRIURETIC PEPTIDE: B NATRIURETIC PEPTIDE 5: 994.3 pg/mL — AB (ref 0.0–100.0)

## 2016-11-28 LAB — I-STAT TROPONIN, ED: Troponin i, poc: 0.16 ng/mL (ref 0.00–0.08)

## 2016-11-28 LAB — GLUCOSE, CAPILLARY: GLUCOSE-CAPILLARY: 179 mg/dL — AB (ref 65–99)

## 2016-11-28 MED ORDER — FUROSEMIDE 10 MG/ML IJ SOLN
20.0000 mg | Freq: Two times a day (BID) | INTRAMUSCULAR | Status: AC
  Administered 2016-11-28 – 2016-11-29 (×2): 20 mg via INTRAVENOUS
  Filled 2016-11-28 (×2): qty 2

## 2016-11-28 MED ORDER — SIMVASTATIN 20 MG PO TABS
20.0000 mg | ORAL_TABLET | Freq: Every day | ORAL | Status: DC
  Administered 2016-11-29 – 2016-11-30 (×2): 20 mg via ORAL
  Filled 2016-11-28 (×2): qty 1

## 2016-11-28 MED ORDER — IOPAMIDOL (ISOVUE-370) INJECTION 76%
100.0000 mL | Freq: Once | INTRAVENOUS | Status: AC | PRN
  Administered 2016-11-28: 100 mL via INTRAVENOUS

## 2016-11-28 MED ORDER — ALIGN 4 MG PO CAPS: 4.0000 mg | ORAL_CAPSULE | Freq: Every day | ORAL | Status: DC

## 2016-11-28 MED ORDER — ACETAMINOPHEN 325 MG PO TABS: 650.0000 mg | ORAL_TABLET | ORAL | Status: DC | PRN

## 2016-11-28 MED ORDER — SODIUM CHLORIDE 0.9 % IV SOLN: 250.0000 mL | INTRAVENOUS | Status: DC | PRN

## 2016-11-28 MED ORDER — TRAMADOL HCL 50 MG PO TABS: 50.0000 mg | ORAL_TABLET | Freq: Four times a day (QID) | ORAL | Status: DC | PRN

## 2016-11-28 MED ORDER — ONDANSETRON HCL 4 MG/2ML IJ SOLN: 4.0000 mg | Freq: Four times a day (QID) | INTRAMUSCULAR | Status: DC | PRN

## 2016-11-28 MED ORDER — FUROSEMIDE 10 MG/ML IJ SOLN
20.0000 mg | Freq: Once | INTRAMUSCULAR | Status: AC
  Administered 2016-11-28: 20 mg via INTRAVENOUS
  Filled 2016-11-28: qty 4

## 2016-11-28 MED ORDER — HALOPERIDOL LACTATE 5 MG/ML IJ SOLN: 1.0000 mg | Freq: Four times a day (QID) | INTRAMUSCULAR | Status: DC | PRN

## 2016-11-28 MED ORDER — RISAQUAD PO CAPS
1.0000 | ORAL_CAPSULE | Freq: Every day | ORAL | Status: DC
  Administered 2016-11-29 – 2016-11-30 (×2): 1 via ORAL
  Filled 2016-11-28 (×2): qty 1

## 2016-11-28 MED ORDER — SODIUM CHLORIDE 0.9% FLUSH
3.0000 mL | Freq: Two times a day (BID) | INTRAVENOUS | Status: DC
  Administered 2016-11-28 – 2016-11-30 (×4): 3 mL via INTRAVENOUS

## 2016-11-28 MED ORDER — ENOXAPARIN SODIUM 40 MG/0.4ML ~~LOC~~ SOLN
40.0000 mg | SUBCUTANEOUS | Status: DC
  Administered 2016-11-28 – 2016-11-29 (×2): 40 mg via SUBCUTANEOUS
  Filled 2016-11-28 (×2): qty 0.4

## 2016-11-28 MED ORDER — INSULIN ASPART 100 UNIT/ML ~~LOC~~ SOLN
0.0000 [IU] | Freq: Three times a day (TID) | SUBCUTANEOUS | Status: DC
Start: 2016-11-29 — End: 2016-11-30
  Administered 2016-11-29: 2 [IU] via SUBCUTANEOUS
  Administered 2016-11-29 – 2016-11-30 (×2): 1 [IU] via SUBCUTANEOUS
  Administered 2016-11-30: 3 [IU] via SUBCUTANEOUS

## 2016-11-28 MED ORDER — HYDROCORTISONE 2.5 % RE CREA: 1.0000 "application " | TOPICAL_CREAM | Freq: Every evening | RECTAL | Status: DC | PRN

## 2016-11-28 MED ORDER — LISINOPRIL 5 MG PO TABS
5.0000 mg | ORAL_TABLET | Freq: Every day | ORAL | Status: DC
  Administered 2016-11-28 – 2016-11-30 (×3): 5 mg via ORAL
  Filled 2016-11-28 (×3): qty 1

## 2016-11-28 MED ORDER — SODIUM CHLORIDE 0.9% FLUSH: 3.0000 mL | INTRAVENOUS | Status: DC | PRN

## 2016-11-28 MED ORDER — FUROSEMIDE 10 MG/ML IJ SOLN: 20.0000 mg | Freq: Two times a day (BID) | INTRAMUSCULAR | Status: DC

## 2016-11-28 MED ORDER — VANCOMYCIN HCL IN DEXTROSE 1-5 GM/200ML-% IV SOLN
1000.0000 mg | Freq: Once | INTRAVENOUS | Status: AC
  Administered 2016-11-28: 1000 mg via INTRAVENOUS
  Filled 2016-11-28: qty 200

## 2016-11-28 MED ORDER — DORZOLAMIDE HCL-TIMOLOL MAL 2-0.5 % OP SOLN
1.0000 [drp] | Freq: Two times a day (BID) | OPHTHALMIC | Status: DC
  Administered 2016-11-28 – 2016-11-30 (×4): 1 [drp] via OPHTHALMIC
  Filled 2016-11-28: qty 10

## 2016-11-28 MED ORDER — VITAMIN D 1000 UNITS PO TABS
1000.0000 [IU] | ORAL_TABLET | Freq: Every day | ORAL | Status: DC
  Administered 2016-11-29 – 2016-11-30 (×2): 1000 [IU] via ORAL
  Filled 2016-11-28 (×2): qty 1

## 2016-11-28 MED ORDER — INSULIN ASPART 100 UNIT/ML ~~LOC~~ SOLN
3.0000 [IU] | Freq: Three times a day (TID) | SUBCUTANEOUS | Status: DC
  Administered 2016-11-29 – 2016-11-30 (×5): 3 [IU] via SUBCUTANEOUS

## 2016-11-28 MED ORDER — IOPAMIDOL (ISOVUE-370) INJECTION 76%
INTRAVENOUS | Status: AC
  Filled 2016-11-28: qty 100

## 2016-11-28 MED ORDER — DONEPEZIL HCL 23 MG PO TABS
23.0000 mg | ORAL_TABLET | Freq: Every day | ORAL | Status: DC
  Administered 2016-11-28 – 2016-11-29 (×2): 23 mg via ORAL
  Filled 2016-11-28 (×2): qty 1

## 2016-11-28 MED ORDER — DEXTROSE 5 % IV SOLN
2.0000 g | Freq: Once | INTRAVENOUS | Status: AC
  Administered 2016-11-28: 2 g via INTRAVENOUS
  Filled 2016-11-28: qty 2

## 2016-11-28 MED ORDER — BUPROPION HCL ER (SR) 100 MG PO TB12
100.0000 mg | ORAL_TABLET | Freq: Every day | ORAL | Status: DC
  Administered 2016-11-29 – 2016-11-30 (×2): 100 mg via ORAL
  Filled 2016-11-28 (×2): qty 1

## 2016-11-28 MED ORDER — MAGNESIUM SULFATE 2 GM/50ML IV SOLN
2.0000 g | Freq: Once | INTRAVENOUS | Status: AC
  Administered 2016-11-29: 2 g via INTRAVENOUS
  Filled 2016-11-28: qty 50

## 2016-11-28 MED ORDER — MIRABEGRON ER 25 MG PO TB24
50.0000 mg | ORAL_TABLET | Freq: Every day | ORAL | Status: DC
  Administered 2016-11-29 – 2016-11-30 (×2): 50 mg via ORAL
  Filled 2016-11-28 (×2): qty 2

## 2016-11-28 MED ORDER — INSULIN ASPART 100 UNIT/ML ~~LOC~~ SOLN
0.0000 [IU] | Freq: Every day | SUBCUTANEOUS | Status: DC
  Administered 2016-11-29: 3 [IU] via SUBCUTANEOUS

## 2016-11-28 MED ORDER — MEMANTINE HCL ER 28 MG PO CP24
28.0000 mg | ORAL_CAPSULE | Freq: Every day | ORAL | Status: DC
  Administered 2016-11-29 – 2016-11-30 (×2): 28 mg via ORAL
  Filled 2016-11-28 (×2): qty 1

## 2016-11-28 NOTE — H&P (Signed)
History and Physical    Jose [name]:[id number] DOB: 06/15/1930 DOA: 11/28/2016  PCP: [name], MD  Patient coming from: SNF  I have personally briefly reviewed patient's old medical records in [place]  Chief Complaint: Shortness of breath and hypoxia  HPI: Jose [name] is a 83 y.o. male with medical history significant of past medical history of advanced dementia, bilateral hip replacement, diabetes mellitus with last A1c of 6.1, is brought in by EMS from skilled nursing facility for shortness of breath and hypoxia. As per family members as the patient cannot provide history had been coughing for 1 week, at the skilled nursing facility 1 day prior to admission he was noticed to be confused his wife states visited him and his saturations were 88%, there is no documentation of fever, vomiting or chest pain. He had been treated empirically with Keflex for skin infection.  ED Course:  Is amount to be mildly hyponatremic hyperkalemic with a BMP of 941, mild leukocytosis, CT imaging showed no PE 12-lead EKG, as below.  Review of Systems: As per HPI otherwise 10 point review of systems negative.   Past Medical History:  Diagnosis Date  . Abnormality of gait   . Backache, unspecified   . Colon polyps    adenomatous  . Dementia 11/12/2012  . Diverticulosis   . Essential and other specified forms of tremor   . Hemorrhoids   . History of bilateral hip replacements 11/12/2012  . Memory loss   . 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 Pain in joint, pelvic region and thigh   . Skin cancer of scalp   . Spinal stenosis, lumbar region, without neurogenic claudication   . Unspecified hereditary and idiopathic peripheral neuropathy     Past Surgical History:  Procedure Laterality Date  . CATARACT EXTRACTION, BILATERAL  2012  . RETINAL LASER PROCEDURE  2012   retinal wrinkle  . SKIN CANCER EXCISION    . TOTAL HIP ARTHROPLASTY Left 2000  .  TOTAL HIP ARTHROPLASTY (aka REPLACEMENT) Right 2011     reports that he has quit smoking. His smoking use included Cigarettes. He has never used smokeless tobacco. He reports that he drinks about 8.4 oz of alcohol per week . He reports that he does not use drugs.  Allergies  Allergen Reactions  . Axona [Flora-Q]     Unknown per MAR  . Gabapentin     Hallucinations    Family History  Problem Relation Age of Onset  . Heart failure Mother      Prior to Admission medications   Medication Sig Start Date End Date Taking? Authorizing Provider  acetaminophen (TYLENOL) 325 MG tablet Take 650 mg by mouth every 4 (four) hours as needed for fever.   Yes [provider]  acetaminophen (TYLENOL) 500 MG tablet Take 500 mg by mouth every 8 (eight) hours as needed (pain).    Yes [provider]  buPROPion (WELLBUTRIN SR) 100 MG 12 hr tablet Take 100 mg by mouth daily. 11/23/16  Yes [provider]  Cholecalciferol (VITAMIN D) 1000 UNITS capsule Take 1,000 Units by mouth daily.     Yes [provider]  dextromethorphan (DELSYM) 30 MG/5ML liquid Take 60 mg by mouth every 12 (twelve) hours as needed for cough. Take 10 mL   Yes [provider]  donepezil (ARICEPT) 23 MG TABS tablet Take 1 tablet (23 mg total) by mouth daily. 10/19/15  Yes Star Age,  MD  dorzolamide-timolol (COSOPT) 22.3-6.8 MG/ML ophthalmic solution Place 1 drop into the left eye 2 (two) times daily.  09/20/12  Yes [provider]  Emollient (CERAVE EX) Apply 1 application topically daily.   Yes [provider]  Emollient (CERAVE) CREA Apply 1 application topically daily. Apply to arms, legs and trunk   Yes [provider]  Flaxseed, Linseed, (FLAXSEED OIL) 1000 MG CAPS Take 1,000 mg by mouth daily.     Yes [provider]  folic acid (FOLVITE) 263 MCG tablet Take 800 mcg by mouth every evening.    Yes [provider]  hydrocortisone (PROCTOSOL HC)  2.5 % rectal cream Place 1 application rectally at bedtime as needed for hemorrhoids or anal itching.   Yes [provider]  hydrocortisone cream 1 % Apply 1 application topically 2 (two) times daily.   Yes [provider]  loperamide (IMODIUM A-D) 2 MG tablet Take 2 mg by mouth 4 (four) times daily as needed for diarrhea or loose stools.    Yes [provider]  memantine (NAMENDA XR) 28 MG CP24 24 hr capsule Take 1 capsule (28 mg total) by mouth daily. 09/27/15  Yes Star Age, MD  metFORMIN (GLUCOPHAGE) 500 MG tablet Take 500 mg by mouth daily.  10/25/12  Yes [provider]  mirabegron ER (MYRBETRIQ) 50 MG TB24 tablet Take 50 mg by mouth daily.   Yes [provider]  Multiple Vitamin (MULTIVITAMIN) tablet Take 1 tablet by mouth daily.     Yes [provider]  nystatin (NYSTATIN) powder Apply topically 2 (two) times daily. Apply to right and left buttocks.   Yes [provider]  potassium chloride (KLOR-CON) 10 MEQ CR tablet Take 10 mEq by mouth 2 (two) times a week. On mondays and fridays   Yes [provider]  Probiotic Product (ALIGN) 4 MG CAPS Take 4 mg by mouth daily.   Yes [provider]  simvastatin (ZOCOR) 20 MG tablet Take 20 mg by mouth daily.   Yes [provider]  traMADol (ULTRAM) 50 MG tablet Take 50 mg by mouth every 6 (six) hours as needed.    Yes [provider]  white petrolatum ointment Apply 1 application topically daily.   Yes [provider]    Physical Exam: Vitals:   11/28/16 1430 11/28/16 1500 11/28/16 1530 11/28/16 1600  BP: 113/80 121/85 122/80 125/85  Pulse: 87 84 88 84  Resp: (!) 22 (!) 22 (!) 21 17  Temp:      TempSrc:      SpO2: 98% 99% 99% 100%  Weight:      Height:        Constitutional: Uncomfortable but in no acute distress. Vitals:   11/28/16 1430 11/28/16 1500 11/28/16 1530 11/28/16 1600  BP: 113/80 121/85 122/80 125/85  Pulse: 87 84 88 84   Resp: (!) 22 (!) 22 (!) 21 17  Temp:      TempSrc:      SpO2: 98% 99% 99% 100%  Weight:      Height:       Eyes: PERRL, lids and conjunctivae normal ENMT: Mucous membranes are moist and is clear. Neck: Neck is supple positive JVD. Respiratory: Good air movement clear to auscultation with crackles bilaterally.  Cardiovascular: Regular rate and rhythm with positive S1-S2. No lower extremity edema. Abdomen: no tenderness, no masses palpated. No hepatosplenomegaly. Bowel sounds positive.  Musculoskeletal: no clubbing / cyanosis. No joint deformity upper and lower extremities.  Good ROM, no contractures. Normal muscle tone.  Skin: no rashes, lesions, ulcers. No induration Neurologic: CN 2-12 grossly intact. Sensation intact, DTR normal. Strength 5/5 in all 4.  Psychiatric: Normal judgment and insight. Alert and oriented x 3. Normal mood.   Labs on Admission: I have personally reviewed following labs and imaging studies  CBC:  Recent Labs Lab 11/28/16 1129  WBC 17.1*  NEUTROABS 14.9*  HGB 14.0  HCT 40.4  MCV 94.4  PLT 096*   Basic Metabolic Panel:  Recent Labs Lab 11/28/16 1129  NA 134*  K 5.2*  CL 105  CO2 21*  GLUCOSE 234*  BUN 13  CREATININE 1.09  CALCIUM 8.4*   GFR: Estimated Creatinine Clearance: 50.4 mL/min (by C-G formula based on SCr of 1.09 mg/dL). Liver Function Tests:  Recent Labs Lab 11/28/16 1129  AST 60*  ALT 37  ALKPHOS 73  BILITOT 1.5*  PROT 5.9*  ALBUMIN 3.1*   No results for input(s): LIPASE, AMYLASE in the last 168 hours. No results for input(s): AMMONIA in the last 168 hours. Coagulation Profile: No results for input(s): INR, PROTIME in the last 168 hours. Cardiac Enzymes: No results for input(s): CKTOTAL, CKMB, CKMBINDEX, TROPONINI in the last 168 hours. BNP (last 3 results) No results for input(s): PROBNP in the last 8760 hours. HbA1C: No results for input(s): HGBA1C in the last 72 hours. CBG: No results for input(s): GLUCAP in  the last 168 hours. Lipid Profile: No results for input(s): CHOL, HDL, LDLCALC, TRIG, CHOLHDL, LDLDIRECT in the last 72 hours. Thyroid Function Tests: No results for input(s): TSH, T4TOTAL, FREET4, T3FREE, THYROIDAB in the last 72 hours. Anemia Panel: No results for input(s): VITAMINB12, FOLATE, FERRITIN, TIBC, IRON, RETICCTPCT in the last 72 hours. Urine analysis:    Component Value Date/Time   COLORURINE YELLOW 11/28/2016 1457   APPEARANCEUR CLEAR 11/28/2016 1457   LABSPEC 1.036 (H) 11/28/2016 1457   PHURINE 5.0 11/28/2016 1457   GLUCOSEU >=500 (A) 11/28/2016 1457   HGBUR NEGATIVE 11/28/2016 1457   BILIRUBINUR NEGATIVE 11/28/2016 1457   KETONESUR NEGATIVE 11/28/2016 1457   PROTEINUR NEGATIVE 11/28/2016 1457   UROBILINOGEN 0.2 06/24/2014 2112   NITRITE NEGATIVE 11/28/2016 1457   LEUKOCYTESUR NEGATIVE 11/28/2016 1457    Radiological Exams on Admission: Ct Angio Chest Pe W/cm &/or Wo Cm  Result Date: 11/28/2016 CLINICAL DATA:  Altered mentation, increasing dyspnea, cough and congestion for the past week, positive D-dimer. EXAM: CT ANGIOGRAPHY CHEST WITH CONTRAST TECHNIQUE: Multidetector CT imaging of the chest was performed using the standard protocol during bolus administration of intravenous contrast. Multiplanar CT image reconstructions and MIPs were obtained to evaluate the vascular anatomy. CONTRAST:  100 cc Isovue 370 IV COMPARISON:  11/28/2016 CXR FINDINGS: Cardiovascular: The study is of quality for the evaluation of pulmonary embolism. There are no filling defects in the central, lobar, and segmental pulmonary arteries to suggest acute pulmonary embolism. Great vessels are normal in course and caliber. Normal heart size. No significant pericardial fluid/thickening. Minimal aortic atherosclerosis without aneurysm. Mediastinum/Nodes: No discrete thyroid nodules. Unremarkable esophagus. No pathologically enlarged axillary, mediastinal or hilar lymph nodes. Lungs/Pleura: No  pneumothorax. No pleural effusion. Small to moderate bilateral pleural effusions with bibasilar compressive and subsegmental atelectasis. Dependent atelectasis is noted along the posterior aspects of both upper lobes. Upper abdomen: Unremarkable. Musculoskeletal: No aggressive appearing focal osseous lesions. There is spondylosis of the included cervical and thoracic spine. No osseous appearing abnormality. Review of the MIP images confirms the above findings. IMPRESSION: 1. Small  to moderate bilateral pleural effusions with adjacent atelectasis. 2. No acute pulmonary embolus. 3. Minimal aortic atherosclerosis without aneurysm. Aortic Atherosclerosis (ICD10-I70.0). Electronically Signed   By: Ashley Royalty M.D.   On: 11/28/2016 13:55   Dg Chest Port 1 View  Result Date: 11/28/2016 CLINICAL DATA:  Altered mental status since last night, increased shortness of breath, cough, and congestion for past week, history dementia EXAM: PORTABLE CHEST 1 VIEW COMPARISON:  Portable exam 1145 hours compared to 06/22/2016 FINDINGS: Borderline enlargement of cardiac silhouette. Mediastinal contours and pulmonary vascularity normal. Low lung volumes with bibasilar atelectasis. Remaining lungs clear. No definite pleural effusion or pneumothorax. IMPRESSION: Decreased lung volumes with bibasilar atelectasis. Electronically Signed   By: Lavonia Dana M.D.   On: 11/28/2016 12:04    EKG: Independently reviewed. Sinus tachycardia left axis deviation QTC 510, nonspecific T-wave changes.  Assessment/Plan Acute on chronic respiratory failure with hypoxia (HCC) due to   Heart failure [facility]): Positive JVD and physical exam crackling the lungs, CT image showed bilateral pleural effusion with a BMP hypoxic on admission. Check a 2D Echo set of cardiac biomarkers also start him on low dose of IV Lasix 2 as his Lasix nave, and will reevaluate in the morning. Strict I's and O's and daily weights Cannot specified at this point whether  systolic or diastolic as we have no 2-D echo. Return telemetry for arrhythmias. Cycle cardiac biomarkers. Check a magnesium level  Alzheimer's dementia without behavioral disturbance Continue current home regimen. We'll give Haldol IV when necessary check a 12-lead EKG in the morning to monitor QTC.  Leukocytosis Unclear has remained afebrile, as per daughter he has had no cough yesterday or today. He is on too depressed type of steroids applied on his skin and on his hemorrhoids which could be concluded to his mild leukocytosis. At this point will do blood cultures 2 will not start empiric antibiotics at this time and repeat a CBC in the morning. We'll hold his topical steroids.  Thrombocytopenia ([place]) Has remained at baseline mild. Will probably need to follow-up with oncology as an outpatient  Controlled type 2 diabetes mellitus without complication, without long-term current use of insulin ([place]): DC oral hypoglycemic agents. Start sliding scale insulin.  Tachycardia Likely Due to volume overload, now improved after starting diuresis.    DVT prophylaxis: lovenox Code Status: full Family Communication: daughter Disposition Plan:  Consults called: none Admission status: inpatient   Charlynne Cousins MD [facility] Pager [phone]  If 7PM-7AM, please contact night-coverage [email or web address] Password [email or web address]  11/28/2016, 4:30 PM

## 2016-11-28 NOTE — ED Notes (Signed)
Condom catheter placed for urine specimen.

## 2016-11-28 NOTE — ED Notes (Signed)
Bed: WA04 Expected date:  Expected time:  Means of arrival:  Comments: EMS/?sepsis? 

## 2016-11-28 NOTE — ED Notes (Signed)
Patient transported to CT 

## 2016-11-28 NOTE — Assessment & Plan Note (Signed)
Taking Wellbutrin 100mg  daily

## 2016-11-28 NOTE — Progress Notes (Signed)
RN may call report to Joellen Jersey [phone] at [phone].

## 2016-11-28 NOTE — ED Triage Notes (Signed)
Per [facility] pt from [facility], recently taken off of keflex for skin infection. Facility states pt altered as of last night, hx of dementia. Patient alert to self and location, not date or situation. Pt was 88% on RA, placed on cpap 96%. Pt has had productive cough for past week. Pt denies any c/o.

## 2016-11-28 NOTE — Progress Notes (Signed)
Location:  [facility] Room Number: 3 Place of Service:  SNF (31) Provider:  Jacinda Kanady, Manxie  NP  [name], MD  Patient Care Team: [name], MD as PCP - General (Internal Medicine) Jadine Brumley X, NP as Nurse Practitioner (Internal Medicine)  Extended Emergency Contact Information Primary Emergency Contact: [name] Address: [place], [place] Phone: [phone] Mobile Phone: [phone] Relation: Spouse Secondary Emergency Contact: [name] [place] Phone: [phone] Work Phone: [phone] Relation: Daughter  Code Status:  Full Code Goals of care: Advanced Directive information Advanced Directives 11/28/2016  Does Patient Have a Medical Advance Directive? No  Type of Advance Directive -  Does patient want to make changes to medical advance directive? No - Patient declined  Would patient like information on creating a medical advance directive? No - Patient declined  Pre-existing out of facility DNR order (yellow form or pink MOST form) Physician notified to receive inpatient order     Chief Complaint  Patient presents with  . Acute Visit    Low Sat's, Rapid HR, Elevated blood sugar,    HPI:  Pt is a 83 y.o. male seen today for an acute visit for sudden onset of respiratory distress, cough, congestion before breakfast, his breakfast was untouched upon my examination. Respiration 32x/min, labored, SatO2 in 80s, up to 92% after O2 2lpm via nasal canula started. Tachycardia was noted, HR 120-130bpm, Diltiazem 30mg  x1 administered, stat EKG, CXR, CBC, CMP pending. The patient is alert, but lethargic, he denied chest pain, palpitation, dizziness, nausea, vomiting, he is afebrile.   He completed 14 day course of Keflex 500mg  bid 11/27/16 for scalp skin infection. Hx of T2DM, Metformin was decreased to 500mg  daly since 11/23/16, last Hgb a1c 6.7 09/26/16. Depression,  mood is managed, Wellbutrin was decreased to 100mg  qd since 11/23/16. He has dementia, resides SNF for care assistance, taking Aricept 23mg  for memory. Urinary frequency, taking Myrbetriq    Past Medical History:  Diagnosis Date  . Abnormality of gait   . Backache, unspecified   . Colon polyps    adenomatous  . Dementia 11/12/2012  . Diverticulosis   . Essential and other specified forms of tremor   . Hemorrhoids   . History of bilateral hip replacements 11/12/2012  . Memory loss   . 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 Pain in joint, pelvic region and thigh   . Skin cancer of scalp   . Spinal stenosis, lumbar region, without neurogenic claudication   . Unspecified hereditary and idiopathic peripheral neuropathy    Past Surgical History:  Procedure Laterality Date  . CATARACT EXTRACTION, BILATERAL  2012  . RETINAL LASER PROCEDURE  2012   retinal wrinkle  . SKIN CANCER EXCISION    . TOTAL HIP ARTHROPLASTY Left 2000  . TOTAL HIP ARTHROPLASTY (aka REPLACEMENT) Right 2011    Allergies  Allergen Reactions  . Axona [Flora-Q]     Pt reported  . Gabapentin     Pt reported    Outpatient Encounter Prescriptions as of 11/28/2016  Medication Sig  . acetaminophen (TYLENOL) 325 MG tablet Take 650 mg by mouth every 4 (four) hours as needed for fever.  Marland Kitchen acetaminophen (TYLENOL) 500 MG tablet Take 500 mg by mouth every 8 (eight) hours as needed.  Marland Kitchen buPROPion (WELLBUTRIN SR) 150 MG 12 hr tablet Take 150 mg by mouth  daily.   . Cholecalciferol (VITAMIN D) 1000 UNITS capsule Take 1,000 Units by mouth daily.    Marland Kitchen dextromethorphan (DELSYM) 30 MG/5ML liquid Take by mouth every 12 (twelve) hours as needed for cough. Take 10 mL  . donepezil (ARICEPT) 23 MG TABS tablet Take 1 tablet (23 mg total) by mouth daily.  . dorzolamide-timolol (COSOPT) 22.3-6.8 MG/ML ophthalmic solution Place 1 drop into the left eye 2 (two) times daily.   . Emollient (CERAVE) CREA  Apply 1 application topically daily. Apply to arms, legs and trunk  . Flaxseed, Linseed, (FLAXSEED OIL) 1000 MG CAPS Take 1,000 mg by mouth daily.    . folic acid (FOLVITE) 694 MCG tablet Take 800 mcg by mouth every evening.   . hydrocortisone 2.5 % cream Apply 1 application topically at bedtime as needed (apply to rectal area).  . hydrocortisone cream 1 % Apply 1 application topically 2 (two) times daily.  Marland Kitchen loperamide (IMODIUM A-D) 2 MG tablet Take 2 mg by mouth 4 (four) times daily as needed for diarrhea or loose stools.   . memantine (NAMENDA XR) 28 MG CP24 24 hr capsule Take 1 capsule (28 mg total) by mouth daily.  . metFORMIN (GLUCOPHAGE) 500 MG tablet Take 500 mg by mouth 2 (two) times daily with a meal.   . mirabegron ER (MYRBETRIQ) 50 MG TB24 tablet Take 50 mg by mouth daily.  . Multiple Vitamin (MULTIVITAMIN) tablet Take 1 tablet by mouth daily.    Marland Kitchen nystatin (NYSTATIN) powder Apply topically 2 (two) times daily. Apply to right and left buttocks.  . potassium chloride (KLOR-CON) 10 MEQ CR tablet Take 10 mEq by mouth 2 (two) times a week.    . Probiotic Product (ALIGN) 4 MG CAPS Take 4 mg by mouth daily.  . simvastatin (ZOCOR) 20 MG tablet Take 20 mg by mouth daily.  . traMADol (ULTRAM) 50 MG tablet Take 50 mg by mouth every 6 (six) hours as needed.   . [DISCONTINUED] cephALEXin (KEFLEX) 500 MG capsule Take 500 mg by mouth 2 (two) times daily. Stop date 11/27/16   No facility-administered encounter medications on file as of 11/28/2016.    ROS is provided with assistance of staff Review of Systems  Constitutional: Positive for activity change, appetite change, diaphoresis and fatigue. Negative for chills and fever.       Lethargic  HENT: Positive for congestion and hearing loss. Negative for drooling, sore throat, trouble swallowing and voice change.   Eyes: Negative for visual disturbance.  Respiratory: Positive for cough and shortness of breath. Negative for choking, chest tightness,  wheezing and stridor.   Cardiovascular: Negative for chest pain, palpitations and leg swelling.  Gastrointestinal: Negative for abdominal distention, abdominal pain, constipation, diarrhea, nausea and vomiting.  Genitourinary: Positive for frequency. Negative for difficulty urinating, dysuria, flank pain, hematuria and urgency.  Musculoskeletal:       The patient was seeing in bed  Skin: Negative for color change, pallor and rash.       Scalp skin lesions.   Neurological: Negative for dizziness, seizures, syncope, speech difficulty, weakness, numbness and headaches.  Psychiatric/Behavioral: Positive for confusion. Negative for behavioral problems and sleep disturbance.    Immunization History  Administered Date(s) Administered  . PPD Test 06/24/2016   Pertinent  Health Maintenance Due  Topic Date Due  . INFLUENZA VACCINE  10/31/2016  . URINE MICROALBUMIN  04/02/2017 (Originally 06/06/1940)  . PNA vac Low Risk Adult (1 of 2 - PCV13) 04/02/2017 (Originally 06/05/1997)  . FOOT  EXAM  09/28/2017 (Originally 06/06/1940)  . OPHTHALMOLOGY EXAM  09/28/2017 (Originally 06/06/1940)  . HEMOGLOBIN A1C  03/27/2017   Fall Risk  01/19/2016 07/21/2015  Falls in the past year? Yes Yes  Number falls in past yr: 1 1  Injury with Fall? No No  Risk for fall due to : Impaired balance/gait -  Follow up Falls prevention discussed Falls prevention discussed   Functional Status Survey:    Vitals:   11/28/16 1003  BP: (!) 146/90  Pulse: (!) 128  Resp: (!) 34  Temp: 97.7 F (36.5 C)  SpO2: (!) 80%  Weight: 182 lb 1.6 oz (82.6 kg)  Height: 5\' 9"  (1.753 m)   Body mass index is 26.89 kg/m. Physical Exam  Constitutional: He appears well-developed and well-nourished. He appears distressed.  HENT:  Head: Normocephalic and atraumatic.  Mouth/Throat: Oropharynx is clear and moist.  Eyes: Pupils are equal, round, and reactive to light. Conjunctivae and EOM are normal. No scleral icterus.  Neck: Normal range  of motion. Neck supple. No JVD present. No tracheal deviation present.  Cardiovascular:  No murmur heard. HR 120-130, irregular   Pulmonary/Chest: No stridor. He is in respiratory distress.  Scattered crackles.   Abdominal: Soft. Bowel sounds are normal. He exhibits no distension. There is no tenderness. There is no rebound and no guarding.  Musculoskeletal: He exhibits no edema.  Neurological: He is alert. No cranial nerve deficit. He exhibits normal muscle tone.  Oriented to self  Skin: Skin is warm. No rash noted. He is diaphoretic. No erythema.  Psychiatric: He has a normal mood and affect. His behavior is normal.    Labs reviewed:  Recent Labs  03/16/16 1201 06/22/16 1057 06/23/16 0430 06/26/16 09/25/16  NA 139 138 138 139 138  K 4.0 3.9 3.7 3.7 4.0  CL 106 105 106  --   --   CO2 25 26 25   --   --   GLUCOSE 164* 147* 151*  --   --   BUN 27* 17 23* 19 22*  CREATININE 1.00 0.98 1.11 0.9 1.2  CALCIUM 9.2 9.3 8.4*  --   --     Recent Labs  03/16/16 1201 06/23/16 0430 06/26/16 09/25/16  AST 22 18 17 19   ALT 27 23 20 27   ALKPHOS 77 66 59 75  BILITOT 0.9 0.7  --   --   PROT 7.0 5.8*  --   --   ALBUMIN 4.0 3.2*  --   --     Recent Labs  03/16/16 1201 06/22/16 1057 06/23/16 0430 06/26/16 09/25/16  WBC 13.2* 18.7* 10.3 9.4 8.2  NEUTROABS  --  16.0*  --   --   --   HGB 15.2 15.0 12.3* 11.3* 13.0*  HCT 44.2 43.9 36.3* 33* 37*  MCV 97.6 96.9 98.1  --   --   PLT 124* 130* 113* 117* 127*   Lab Results  Component Value Date   TSH 2.40 06/26/2016   Lab Results  Component Value Date   HGBA1C 6.7 09/25/2016   Lab Results  Component Value Date   CHOL 118 08/16/2016   HDL 53 08/16/2016   LDLCALC 50 08/16/2016   TRIG 66 08/16/2016    Significant Diagnostic Results in last 30 days:  No results found.  Assessment/Plan Acute respiratory failure (HCC) Respiratory 32, Sat O2 92% on O2 2lp via [place], stat EKG, CXR, CBC, CMP. ED evaluate if symptoms  persist  Tachycardia Heart rate 120-130bpm, Diltiazem 30mg  po stat,  then qid, monitor VS q2hr, pending EKG, ED evaluate as needed.   Type 2 diabetes mellitus with diabetic chronic kidney disease (HCC) Elevated CBG before breakfast 395, he takes Metformin 500mg  daily, blood sugar has been controlled prior to today. Monitor CBG closely.   Alzheimer's dementia without behavioral disturbance Taking Aricept 23mg  po daily, resides in SNF  OAB (overactive bladder) Managed with Myrbetriq  Depression with anxiety Taking Wellbutrin 100mg  daily    Family/ staff Communication: ED if needed. Plan of care reviewed with the patient, POA, and charge nurese  Labs/tests ordered:  CBC CMP CXR EKG  Time spend 45 minutes

## 2016-11-28 NOTE — Assessment & Plan Note (Signed)
Respiratory 32, Sat O2 92% on O2 2lp via [place], stat EKG, CXR, CBC, CMP. ED evaluate if symptoms persist

## 2016-11-28 NOTE — Assessment & Plan Note (Signed)
Elevated CBG before breakfast 395, he takes Metformin 500mg  daily, blood sugar has been controlled prior to today. Monitor CBG closely.

## 2016-11-28 NOTE — Progress Notes (Signed)
A consult was received from an ED physician for vancomycin per pharmacy dosing.  The patient's profile has been reviewed for ht/wt/allergies/indication/available labs.   A one time order has been placed for vanc 1g.  Further antibiotics/pharmacy consults should be ordered by admitting physician if indicated.                       Thank you,  Adrian Saran, PharmD, BCPS Pager [phone] 11/28/2016 12:27 PM

## 2016-11-28 NOTE — Assessment & Plan Note (Signed)
Heart rate 120-130bpm, Diltiazem 30mg  po stat, then qid, monitor VS q2hr, pending EKG, ED evaluate as needed.

## 2016-11-28 NOTE — ED Notes (Signed)
Hospitalist at bedside 

## 2016-11-28 NOTE — Progress Notes (Signed)
Patient arrived to ED on CPAP via EMS. Patient removed from CPAP and placed on [place]. [place] weaned to 2 L and patient has O2 sat of 96-98%. Mildly labored on assessment, but alert to situation and engages when spoken to by RT. Patient has hx of dementia. EDP Dr. Ralene Bathe and RN at bedside. RT will continue to monitor patient.

## 2016-11-28 NOTE — ED Provider Notes (Signed)
[facility] DEPT Provider Note   CSN: [id number] Arrival date & time: 11/28/16  1111     History   Chief Complaint Chief Complaint  Patient presents with  . Shortness of Breath    HPI Jose [name] is a 83 y.o. male.  The history is provided by the patient, the spouse and the EMS personnel. No language interpreter was used.   Jose [name] is a 83 y.o. male who presents to the Emergency Department complaining of sob.  Level V caveat due to dementia.  He presents via EMS from friend's home for evaluation of increased shortness of breath and hypoxia. He has a history of CHF and has been noted to have increased cough and sputum production for the last week. He was noted to have altered mental status this morning when his wife was visiting as well as hypoxia with sats of 88%. No reports of fevers, vomiting, chest pain. Patient denies any complaints in the emergency department.  Per report he has recently completed a course of keflex for skin infection.  Past Medical History:  Diagnosis Date  . Abnormality of gait   . Backache, unspecified   . Colon polyps    adenomatous  . Dementia 11/12/2012  . Diverticulosis   . Essential and other specified forms of tremor   . Hemorrhoids   . History of bilateral hip replacements 11/12/2012  . Memory loss   . 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 Pain in joint, pelvic region and thigh   . Skin cancer of scalp   . Spinal stenosis, lumbar region, without neurogenic claudication   . Unspecified hereditary and idiopathic peripheral neuropathy     Patient Active Problem List   Diagnosis Date Noted  . Acute on chronic respiratory failure with hypoxia ([place]) 11/28/2016  . Tachycardia 11/28/2016  . Heart failure ([place]) 11/28/2016  . Acute heart failure ([place]) 11/28/2016  . Major depression, recurrent, chronic ([place]) 11/23/2016  . OAB (overactive bladder) 11/23/2016  . Controlled type 2 diabetes mellitus without  complication, without long-term current use of insulin ([place]) 09/28/2016  . Dermatitis 08/23/2016  . External hemorrhoid 07/26/2016  . Decubitus ulcer of buttock, stage 2 07/09/2016  . Depression with anxiety 07/09/2016  . Thrombocytopenia ([place]) 06/28/2016  . Rash 06/25/2016  . Urinary frequency 06/25/2016  . Closed left hip fracture, sequela 06/22/2016  . Type 2 diabetes mellitus with diabetic chronic kidney disease ([place]) 06/22/2016  . Hyperlipidemia LDL goal <70 06/22/2016  . Leukocytosis 06/22/2016  . Alzheimer's dementia without behavioral disturbance 11/12/2012  . History of bilateral hip replacements 11/12/2012    Past Surgical History:  Procedure Laterality Date  . CATARACT EXTRACTION, BILATERAL  2012  . RETINAL LASER PROCEDURE  2012   retinal wrinkle  . SKIN CANCER EXCISION    . TOTAL HIP ARTHROPLASTY Left 2000  . TOTAL HIP ARTHROPLASTY (aka REPLACEMENT) Right 2011       Home Medications    Prior to Admission medications   Medication Sig Start Date End Date Taking? Authorizing Provider  acetaminophen (TYLENOL) 325 MG tablet Take 650 mg by mouth every 4 (four) hours as needed for fever.   Yes [provider]  acetaminophen (TYLENOL) 500 MG tablet Take 500 mg by mouth every 8 (eight) hours as needed (pain).    Yes [provider]  buPROPion (WELLBUTRIN SR) 100 MG 12 hr tablet Take 100 mg by mouth daily. 11/23/16  Yes [provider]  Cholecalciferol (VITAMIN  D) 1000 UNITS capsule Take 1,000 Units by mouth daily.     Yes [provider]  dextromethorphan (DELSYM) 30 MG/5ML liquid Take 60 mg by mouth every 12 (twelve) hours as needed for cough. Take 10 mL   Yes [provider]  donepezil (ARICEPT) 23 MG TABS tablet Take 1 tablet (23 mg total) by mouth daily. 10/19/15  Yes Star Age, MD  dorzolamide-timolol (COSOPT) 22.3-6.8 MG/ML ophthalmic solution Place 1 drop into the left eye 2 (two) times daily.  09/20/12  Yes [provider]  Emollient (CERAVE EX) Apply 1 application topically daily.   Yes [provider]  Emollient (CERAVE) CREA Apply 1 application topically daily. Apply to arms, legs and trunk   Yes [provider]  Flaxseed, Linseed, (FLAXSEED OIL) 1000 MG CAPS Take 1,000 mg by mouth daily.     Yes [provider]  folic acid (FOLVITE) 774 MCG tablet Take 800 mcg by mouth every evening.    Yes [provider]  hydrocortisone (PROCTOSOL HC) 2.5 % rectal cream Place 1 application rectally at bedtime as needed for hemorrhoids or anal itching.   Yes [provider]  hydrocortisone cream 1 % Apply 1 application topically 2 (two) times daily.   Yes [provider]  loperamide (IMODIUM A-D) 2 MG tablet Take 2 mg by mouth 4 (four) times daily as needed for diarrhea or loose stools.    Yes [provider]  memantine (NAMENDA XR) 28 MG CP24 24 hr capsule Take 1 capsule (28 mg total) by mouth daily. 09/27/15  Yes Star Age, MD  metFORMIN (GLUCOPHAGE) 500 MG tablet Take 500 mg by mouth daily.  10/25/12  Yes [provider]  mirabegron ER (MYRBETRIQ) 50 MG TB24 tablet Take 50 mg by mouth daily.   Yes [provider]  Multiple Vitamin (MULTIVITAMIN) tablet Take 1 tablet by mouth daily.     Yes [provider]  nystatin (NYSTATIN) powder Apply topically 2 (two) times daily. Apply to right and left buttocks.   Yes [provider]  potassium chloride (KLOR-CON) 10 MEQ CR tablet Take 10 mEq by mouth 2 (two) times a week. On mondays and fridays   Yes [provider]  Probiotic Product (ALIGN) 4 MG CAPS Take 4 mg by mouth daily.   Yes [provider]  simvastatin (ZOCOR) 20 MG tablet Take 20 mg by mouth daily.   Yes [provider]  traMADol (ULTRAM) 50 MG tablet Take 50 mg by mouth every 6 (six) hours as needed.    Yes [provider]  white petrolatum ointment Apply 1 application  topically daily.   Yes [provider]    Family History Family History  Problem Relation Age of Onset  . Heart failure Mother     Social History Social History  Substance Use Topics  . Smoking status: Former Smoker    Types: Cigarettes  . Smokeless tobacco: Never Used  . Alcohol use 8.4 oz/week    14 Glasses of wine per week     Comment: occas, one glass of wine before dinner,sometimes 1/2 glass     Allergies   Axona [flora-q] and Gabapentin   Review of Systems Review of Systems  All other systems reviewed and are negative.    Physical Exam Updated Vital Signs BP (!) 141/86   Pulse 88   Temp 99.3 F (37.4 C) (Rectal)   Resp 20   Ht 5\' 9"  (1.753 m)   Wt 82.6  kg (182 lb)   SpO2 98%   BMI 26.88 kg/m   Physical Exam  Constitutional: He appears well-developed and well-nourished.  HENT:  Head: Normocephalic and atraumatic.  Cardiovascular: Regular rhythm.   No murmur heard. tachycardic  Pulmonary/Chest: Effort normal. No respiratory distress.  Occasional bibasilar crackles, tachypneic  Abdominal: Soft. There is no tenderness. There is no rebound and no guarding.  Musculoskeletal: He exhibits no tenderness.  Trace pitting edema to RLE  Neurological: He is alert.  Disoriented to place and time  Skin: Skin is warm and dry. There is pallor.  Psychiatric: He has a normal mood and affect. His behavior is normal.  Nursing note and vitals reviewed.    ED Treatments / Results  Labs (all labs ordered are listed, but only abnormal results are displayed) Labs Reviewed  COMPREHENSIVE METABOLIC PANEL - Abnormal; Notable for the following:       Result Value   Sodium 134 (*)    Potassium 5.2 (*)    CO2 21 (*)    Glucose, Bld 234 (*)    Calcium 8.4 (*)    Total Protein 5.9 (*)    Albumin 3.1 (*)    AST 60 (*)    Total Bilirubin 1.5 (*)    All other components within normal limits  CBC WITH DIFFERENTIAL/PLATELET - Abnormal; Notable for the following:     WBC 17.1 (*)    Platelets 145 (*)    Neutro Abs 14.9 (*)    All other components within normal limits  URINALYSIS, ROUTINE W REFLEX MICROSCOPIC - Abnormal; Notable for the following:    Specific Gravity, Urine 1.036 (*)    Glucose, UA >=500 (*)    Bacteria, UA RARE (*)    Squamous Epithelial / LPF 0-5 (*)    All other components within normal limits  BRAIN NATRIURETIC PEPTIDE - Abnormal; Notable for the following:    B Natriuretic Peptide 994.3 (*)    All other components within normal limits  BLOOD GAS, VENOUS - Abnormal; Notable for the following:    pCO2, Ven 41.8 (*)    Acid-base deficit 2.2 (*)    All other components within normal limits  D-DIMER, QUANTITATIVE (NOT AT [facility]) - Abnormal; Notable for the following:    D-Dimer, Quant 1.33 (*)    All other components within normal limits  I-STAT TROPONIN, ED - Abnormal; Notable for the following:    Troponin i, poc 0.16 (*)    All other components within normal limits  I-STAT CG4 LACTIC ACID, ED    EKG  EKG Interpretation  Date/Time:  Wednesday November 28 2016 11:57:22 EDT Ventricular Rate:  91 PR Interval:    QRS Duration: 101 QT Interval:  405 QTC Calculation: 499 R Axis:   -3 Text Interpretation:  Sinus rhythm Multiple ventricular premature complexes Abnormal R-wave progression, early transition Nonspecific T abnormalities, lateral leads Borderline prolonged QT interval Baseline wander in lead(s) I II aVR Confirmed by Quintella Reichert [phone]) on 11/28/2016 1:35:04 PM       Radiology Ct Angio Chest Pe W/cm &/or Wo Cm  Result Date: 11/28/2016 CLINICAL DATA:  Altered mentation, increasing dyspnea, cough and congestion for the past week, positive D-dimer. EXAM: CT ANGIOGRAPHY CHEST WITH CONTRAST TECHNIQUE: Multidetector CT imaging of the chest was performed using the standard protocol during bolus administration of intravenous contrast. Multiplanar CT image reconstructions and MIPs were obtained to evaluate the vascular  anatomy. CONTRAST:  100 cc Isovue 370 IV COMPARISON:  11/28/2016 CXR FINDINGS:  Cardiovascular: The study is of quality for the evaluation of pulmonary embolism. There are no filling defects in the central, lobar, and segmental pulmonary arteries to suggest acute pulmonary embolism. Great vessels are normal in course and caliber. Normal heart size. No significant pericardial fluid/thickening. Minimal aortic atherosclerosis without aneurysm. Mediastinum/Nodes: No discrete thyroid nodules. Unremarkable esophagus. No pathologically enlarged axillary, mediastinal or hilar lymph nodes. Lungs/Pleura: No pneumothorax. No pleural effusion. Small to moderate bilateral pleural effusions with bibasilar compressive and subsegmental atelectasis. Dependent atelectasis is noted along the posterior aspects of both upper lobes. Upper abdomen: Unremarkable. Musculoskeletal: No aggressive appearing focal osseous lesions. There is spondylosis of the included cervical and thoracic spine. No osseous appearing abnormality. Review of the MIP images confirms the above findings. IMPRESSION: 1. Small to moderate bilateral pleural effusions with adjacent atelectasis. 2. No acute pulmonary embolus. 3. Minimal aortic atherosclerosis without aneurysm. Aortic Atherosclerosis (ICD10-I70.0). Electronically Signed   By: Ashley Royalty M.D.   On: 11/28/2016 13:55   Dg Chest Port 1 View  Result Date: 11/28/2016 CLINICAL DATA:  Altered mental status since last night, increased shortness of breath, cough, and congestion for past week, history dementia EXAM: PORTABLE CHEST 1 VIEW COMPARISON:  Portable exam 1145 hours compared to 06/22/2016 FINDINGS: Borderline enlargement of cardiac silhouette. Mediastinal contours and pulmonary vascularity normal. Low lung volumes with bibasilar atelectasis. Remaining lungs clear. No definite pleural effusion or pneumothorax. IMPRESSION: Decreased lung volumes with bibasilar atelectasis. Electronically Signed   By: Lavonia Dana M.D.   On: 11/28/2016 12:04    Procedures Procedures (including critical care time)  Medications Ordered in ED Medications  ceFEPIme (MAXIPIME) 2 g in dextrose 5 % 50 mL IVPB (0 g Intravenous Stopped 11/28/16 1320)  vancomycin (VANCOCIN) IVPB 1000 mg/200 mL premix (0 mg Intravenous Stopped 11/28/16 1353)  iopamidol (ISOVUE-370) 76 % injection 100 mL (100 mLs Intravenous Contrast Given 11/28/16 1331)  furosemide (LASIX) injection 20 mg (20 mg Intravenous Given 11/28/16 1448)     Initial Impression / Assessment and Plan / ED Course  I have reviewed the triage vital signs and the nursing notes.  Pertinent labs & imaging results that were available during my care of the patient were reviewed by me and considered in my medical decision making (see chart for details).     Patient here for evaluation of increased shortness of breath, started on CPAP by EMS. On ED arrival he was transitioned to nasal cannula. Initial concern for possible pneumonia and he was started with IV antibiotics. Fluids were held given concern for possible CHF. BNP and troponin are elevated, EKG without acute ischemic changes and patient without chest pain. CTA is negative for PE and pneumonia. He was treated with Lasix. Hospitalist consulted for admission for ongoing treatment. Patient and family updated studies and recommendation for admission and they are in agreement with plan.  Final Clinical Impressions(s) / ED Diagnoses   Final diagnoses:  Acute congestive heart failure, unspecified heart failure type ([place])  Shortness of breath  Acute respiratory failure with hypoxia [facility])    New Prescriptions New Prescriptions   No medications on file     Quintella Reichert, MD 11/28/16 1759

## 2016-11-28 NOTE — Assessment & Plan Note (Signed)
Taking Aricept 23mg  po daily, resides in SNF

## 2016-11-28 NOTE — Assessment & Plan Note (Signed)
Managed with Myrbetriq.  

## 2016-11-28 NOTE — Clinical Social Work Note (Signed)
Clinical Social Work Assessment  Patient Details  Name: Jose [name] MRN: [id number] Date of Birth: Jan 19, 1931  Date of referral:  11/28/16               Reason for consult:  Facility Placement                Permission sought to share information with:    Permission granted to share information::  Yes, Verbal Permission Granted  Name::        Agency::     Relationship::     Contact Information:     Housing/Transportation Living arrangements for the past 2 months:  [place] of Information:  Spouse Patient Interpreter Needed:  None Criminal Activity/Legal Involvement Pertinent to Current Situation/Hospitalization:    Significant Relationships:  Spouse Lives with:  Facility Resident, Roommate Do you feel safe going back to the place where you live?  No Need for family participation in patient care:  Yes (Comment)  Care giving concerns:  None listed by pt/family  Social Worker assessment / plan:  CSW spoke with pt's wife and confirmed pt's [facility].  CSW provided active listening and validated pt's wife's concerns.   CSW Dept was given permission to complete FL-2 and send referral to send pt back to [facility] facility via the hub per pt's request.  Pt has been living at [facility] for three months after a fall the pt suffered from when living at [facility], prior to being admitted to [facility].   Employment status:  Retired Nurse, adult PT Recommendations:  Not assessed at this time Information / Referral to community resources:     Patient/Family's Response to care:  Patient has dementia and is not fully alert and oriented.  Patient's wife agreeable to plan.  Pt's ife's supportive and strongly involved in pt.'s care.  Pt.'s Still assessing pleasant and appreciated CSW intervention.    Patient/Family's Understanding of and Emotional  Response to Diagnosis, Current Treatment, and Prognosis:  Still assessing  Emotional Assessment Appearance:   (Unknown) Attitude/Demeanor/Rapport:  Unable to Assess Affect (typically observed):  Unable to Assess Orientation:  Fluctuating Orientation (Suspected and/or reported [place]) (Dementia) Alcohol / Substance use:    Psych involvement (Current and /or in the community):     Discharge Needs  Concerns to be addressed:  No discharge needs identified Readmission within the last 30 days:  No Current discharge risk:  None Barriers to Discharge:  No Barriers Identified   Claudine Mouton, LCSWA 11/28/2016, 9:56 PM

## 2016-11-29 ENCOUNTER — Inpatient Hospital Stay (HOSPITAL_COMMUNITY): Payer: Medicare Other

## 2016-11-29 ENCOUNTER — Encounter (HOSPITAL_COMMUNITY): Payer: Self-pay | Admitting: Cardiology

## 2016-11-29 DIAGNOSIS — I509 Heart failure, unspecified: Secondary | ICD-10-CM

## 2016-11-29 DIAGNOSIS — I110 Hypertensive heart disease with heart failure: Secondary | ICD-10-CM | POA: Diagnosis not present

## 2016-11-29 DIAGNOSIS — R7989 Other specified abnormal findings of blood chemistry: Secondary | ICD-10-CM | POA: Diagnosis not present

## 2016-11-29 DIAGNOSIS — R0600 Dyspnea, unspecified: Secondary | ICD-10-CM

## 2016-11-29 DIAGNOSIS — I5031 Acute diastolic (congestive) heart failure: Secondary | ICD-10-CM

## 2016-11-29 DIAGNOSIS — I5041 Acute combined systolic (congestive) and diastolic (congestive) heart failure: Secondary | ICD-10-CM | POA: Diagnosis not present

## 2016-11-29 DIAGNOSIS — E876 Hypokalemia: Secondary | ICD-10-CM | POA: Diagnosis not present

## 2016-11-29 DIAGNOSIS — I5021 Acute systolic (congestive) heart failure: Secondary | ICD-10-CM | POA: Diagnosis not present

## 2016-11-29 LAB — ECHOCARDIOGRAM COMPLETE
Height: 73 in
WEIGHTICAEL: 2864.22 [oz_av]

## 2016-11-29 LAB — BASIC METABOLIC PANEL
ANION GAP: 7 (ref 5–15)
BUN: 15 mg/dL (ref 6–20)
CO2: 28 mmol/L (ref 22–32)
Calcium: 8.7 mg/dL — ABNORMAL LOW (ref 8.9–10.3)
Chloride: 103 mmol/L (ref 101–111)
Creatinine, Ser: 1.11 mg/dL (ref 0.61–1.24)
GFR calc Af Amer: >60 mL/min (ref >60)
GFR, EST NON AFRICAN AMERICAN: 59 mL/min — AB (ref >60)
GLUCOSE: 184 mg/dL — AB (ref 65–99)
POTASSIUM: 3.3 mmol/L — AB (ref 3.5–5.1)
Sodium: 138 mmol/L (ref 135–145)

## 2016-11-29 LAB — MAGNESIUM: Magnesium: 1.8 mg/dL (ref 1.7–2.4)

## 2016-11-29 LAB — TROPONIN I
TROPONIN I: 0.17 ng/mL — AB (ref <0.03)
TROPONIN I: 0.15 ng/mL — AB (ref <0.03)
TROPONIN I: 0.09 ng/mL — AB (ref <0.03)

## 2016-11-29 LAB — MRSA PCR SCREENING: MRSA by PCR: NEGATIVE

## 2016-11-29 LAB — GLUCOSE, CAPILLARY
GLUCOSE-CAPILLARY: 128 mg/dL — AB (ref 65–99)
GLUCOSE-CAPILLARY: 258 mg/dL — AB (ref 65–99)
Glucose-Capillary: 162 mg/dL — ABNORMAL HIGH (ref 65–99)
Glucose-Capillary: 114 mg/dL — ABNORMAL HIGH (ref 65–99)

## 2016-11-29 LAB — CREATININE, SERUM
Creatinine, Ser: 0.94 mg/dL (ref 0.61–1.24)
GFR calc non Af Amer: >60 mL/min (ref >60)

## 2016-11-29 MED ORDER — POTASSIUM CHLORIDE CRYS ER 20 MEQ PO TBCR
40.0000 meq | EXTENDED_RELEASE_TABLET | Freq: Once | ORAL | Status: AC
  Administered 2016-11-29: 40 meq via ORAL
  Filled 2016-11-29: qty 2

## 2016-11-29 NOTE — Evaluation (Signed)
Physical Therapy Evaluation Patient Details Name: Jose [name] MRN: [id number] DOB: 1930/07/23 Today's Date: 11/29/2016   History of Present Illness  83 yo male admitted with HF. Hx of L greater trochanter fx 05/2016, dementia, bil hip replacements, DM. Pt is from a SNF.  Clinical Impression  On eval, pt required Mod assist for mobility. He walked ~20 feet with a RW. Pt is unsteady and at risk for falls. Pt presents with general weakness, decreased activity tolerance, and impaired gait and balance. Pt is from a SNF. Recommend pt return to SNF to continue rehab.     Follow Up Recommendations SNF    Equipment Recommendations  None recommended by PT    Recommendations for Other Services       Precautions / Restrictions Precautions Precautions: Fall Restrictions Weight Bearing Restrictions: No      Mobility  Bed Mobility Overal bed mobility: Needs Assistance Bed Mobility: Supine to Sit;Sit to Supine     Supine to sit: Mod assist;HOB elevated Sit to supine: Mod assist;HOB elevated   General bed mobility comments: Assist for trunk and bil LEs. Increased time. Multimodal cueing required.   Transfers Overall transfer level: Needs assistance Equipment used: Rolling walker (2 wheeled) Transfers: Sit to/from Stand Sit to Stand: Mod assist;From elevated surface         General transfer comment: Assist to rise, stabilize, control descent. Multimodal cueing for safety, technique, hand placement. Pt tries to sit prematurely.  Ambulation/Gait Ambulation/Gait assistance: Min assist Ambulation Distance (Feet): 20 Feet Assistive device: Rolling walker (2 wheeled) Gait Pattern/deviations: Step-through pattern;Trunk flexed;Narrow base of support     General Gait Details: Assist to stabilize pt and maneuver with a RW. Pt fatigues easily. Unsteady and at risk for falls.   Stairs            Wheelchair Mobility    Modified Rankin (Stroke Patients Only)       Balance                                              Pertinent Vitals/Pain Pain Assessment: No/denies pain    Home Living Family/patient expects to be discharged to:: Skilled nursing facility                      Prior Function Level of Independence: Needs assistance   Gait / Transfers Assistance Needed: working with PT-ambulating with RW           Hand Dominance        Extremity/Trunk Assessment   Upper Extremity Assessment Upper Extremity Assessment: Generalized weakness    Lower Extremity Assessment Lower Extremity Assessment: Generalized weakness    Cervical / Trunk Assessment Cervical / Trunk Assessment: Kyphotic  Communication   Communication: HOH  Cognition Arousal/Alertness: Awake/alert Behavior During Therapy: WFL for tasks assessed/performed Overall Cognitive Status: History of cognitive impairments - at baseline                                        General Comments      Exercises     Assessment/Plan    PT Assessment Patient needs continued PT services  PT Problem List Decreased strength;Decreased mobility;Decreased activity tolerance;Decreased balance;Decreased knowledge of use of DME       PT  Treatment Interventions DME instruction;Gait training;Therapeutic activities;Patient/family education;Balance training;Functional mobility training;Therapeutic exercise    PT Goals (Current goals can be found in the Care Plan section)  Acute Rehab PT Goals Patient Stated Goal: per wife, return to "healthcare"/snf PT Goal Formulation: With patient/family Time For Goal Achievement: 12/06/16 Potential to Achieve Goals: Fair    Frequency Min 2X/week   Barriers to discharge        Co-evaluation               AM-PAC PT "6 Clicks" Daily Activity  Outcome Measure Difficulty turning over in bed (including adjusting bedclothes, sheets and blankets)?: Unable Difficulty moving from lying on back to sitting on the  side of the bed? : Unable Difficulty sitting down on and standing up from a chair with arms (e.g., wheelchair, bedside commode, etc,.)?: Unable Help needed moving to and from a bed to chair (including a wheelchair)?: A Lot Help needed walking in hospital room?: A Lot Help needed climbing 3-5 steps with a railing? : Total 6 Click Score: 8    End of Session Equipment Utilized During Treatment: Gait belt Activity Tolerance: Patient limited by fatigue Patient left: in bed;with call bell/phone within reach;with family/visitor present;with bed alarm set   PT Visit Diagnosis: Muscle weakness (generalized) (M62.81);Difficulty in walking, not elsewhere classified (R26.2)    Time: 3500-9381 PT Time Calculation (min) (ACUTE ONLY): 15 min   Charges:   PT Evaluation $PT Eval Low Complexity: 1 Low     PT G Codes:          Weston Anna, MPT Pager: [phone]

## 2016-11-29 NOTE — Progress Notes (Signed)
Patient ID: Jose [name], male   DOB: [birth year], 83 y.o.   MRN: [id number]    PROGRESS NOTE    Jose [name]  DZH:[id number] DOB: 03-18-1931 DOA: 11/28/2016  PCP: [name], MD   Brief Narrative:  83 y.o. male with advanced dementia, bilateral hip replacement, diabetes mellitus with last A1c of 6.1, presented with dyspnea.  Assessment & Plan:  Acute diastolic CHF - no edema on exam, breath sounds clear this AM - has been on lasix - appreciate cardiology input - follow up on ECHO - keep on Lasix 20 mg IV BID   Elevated trop - demand ischemia - follow up on ECHO - no chest pain this AM   Hypokalemia - mild, supplement - BMP in AM  Alzheimer's dementia - pt has personal care giver at bedside    DM type II - keep on SSI for now   Thrombocytopenia - mild, reactive - resolved  Leukocytosis - suspect reactive - trending down off ABX   DVT prophylaxis:  Code Status: Full  Family Communication: Patient at bedside, wife at bedside Disposition Plan: to be determined   Consultants:   Cardio  Procedures:   None  Antimicrobials:   None   Subjective: No chest pain this AM.   Objective: Vitals:   11/28/16 2130 11/28/16 2203 11/29/16 0520 11/29/16 0915  BP: (!) 114/92 128/83 122/76 95/60  Pulse: 89 87 80 86  Resp: 18 20 18 16   Temp:  97.6 F (36.4 C) (!) 97.4 F (36.3 C) 97.7 F (36.5 C)  TempSrc:  Axillary Oral Oral  SpO2: 99% 100% 100% 92%  Weight:  81.2 kg (179 lb 0.2 oz) 81.2 kg (179 lb 0.2 oz)   Height:  6\' 1"  (1.854 m)      Intake/Output Summary (Last 24 hours) at 11/29/16 1336 Last data filed at 11/29/16 1004  Gross per 24 hour  Intake              620 ml  Output              250 ml  Net              370 ml   Filed Weights   11/28/16 1131 11/28/16 2203 11/29/16 0520  Weight: 82.6 kg (182 lb) 81.2 kg (179 lb 0.2 oz) 81.2 kg (179 lb 0.2 oz)    Examination:  General exam: Appears calm and comfortable  Respiratory system: Clear to  auscultation. Respiratory effort normal. Cardiovascular system: S1 & S2 heard, RRR. No JVD, murmurs, rubs, gallops or clicks. No pedal edema. Gastrointestinal system: Abdomen is nondistended, soft and nontender. No organomegaly or masses felt.  Central nervous system: Alert and oriented. No focal neurological deficits. Extremities: Symmetric 5 x 5 power.   Data Reviewed: I have personally reviewed following labs and imaging studies  CBC:  Recent Labs Lab 11/28/16 1129 11/28/16 2315  WBC 17.1* 11.2*  NEUTROABS 14.9*  --   HGB 14.0 13.8  HCT 40.4 40.3  MCV 94.4 94.4  PLT 145* 419   Basic Metabolic Panel:  Recent Labs Lab 11/28/16 1129 11/28/16 2315 11/29/16 0232  NA 134*  --  138  K 5.2*  --  3.3*  CL 105  --  103  CO2 21*  --  28  GLUCOSE 234*  --  184*  BUN 13  --  15  CREATININE 1.09 0.94 1.11  CALCIUM 8.4*  --  8.7*  MG  --  1.8  --  Liver Function Tests:  Recent Labs Lab 11/28/16 1129  AST 60*  ALT 37  ALKPHOS 73  BILITOT 1.5*  PROT 5.9*  ALBUMIN 3.1*   Cardiac Enzymes:  Recent Labs Lab 11/28/16 2315 11/29/16 0232 11/29/16 0834  TROPONINI 0.17* 0.15* 0.09*   CBG:  Recent Labs Lab 11/28/16 2235 11/29/16 0716 11/29/16 1248  GLUCAP 179* 128* 162*   Urine analysis:    Component Value Date/Time   COLORURINE YELLOW 11/28/2016 [place] 11/28/2016 1457   LABSPEC 1.036 (H) 11/28/2016 1457   PHURINE 5.0 11/28/2016 1457   GLUCOSEU >=500 (A) 11/28/2016 1457   HGBUR NEGATIVE 11/28/2016 1457   BILIRUBINUR NEGATIVE 11/28/2016 1457   KETONESUR NEGATIVE 11/28/2016 1457   PROTEINUR NEGATIVE 11/28/2016 1457   UROBILINOGEN 0.2 06/24/2014 2112   NITRITE NEGATIVE 11/28/2016 1457   LEUKOCYTESUR NEGATIVE 11/28/2016 1457    Radiology Studies: Ct Angio Chest Pe W/cm &/or Wo Cm  Result Date: 11/28/2016 CLINICAL DATA:  Altered mentation, increasing dyspnea, cough and congestion for the past week, positive D-dimer. EXAM: CT ANGIOGRAPHY  CHEST WITH CONTRAST TECHNIQUE: Multidetector CT imaging of the chest was performed using the standard protocol during bolus administration of intravenous contrast. Multiplanar CT image reconstructions and MIPs were obtained to evaluate the vascular anatomy. CONTRAST:  100 cc Isovue 370 IV COMPARISON:  11/28/2016 CXR FINDINGS: Cardiovascular: The study is of quality for the evaluation of pulmonary embolism. There are no filling defects in the central, lobar, and segmental pulmonary arteries to suggest acute pulmonary embolism. Great vessels are normal in course and caliber. Normal heart size. No significant pericardial fluid/thickening. Minimal aortic atherosclerosis without aneurysm. Mediastinum/Nodes: No discrete thyroid nodules. Unremarkable esophagus. No pathologically enlarged axillary, mediastinal or hilar lymph nodes. Lungs/Pleura: No pneumothorax. No pleural effusion. Small to moderate bilateral pleural effusions with bibasilar compressive and subsegmental atelectasis. Dependent atelectasis is noted along the posterior aspects of both upper lobes. Upper abdomen: Unremarkable. Musculoskeletal: No aggressive appearing focal osseous lesions. There is spondylosis of the included cervical and thoracic spine. No osseous appearing abnormality. Review of the MIP images confirms the above findings. IMPRESSION: 1. Small to moderate bilateral pleural effusions with adjacent atelectasis. 2. No acute pulmonary embolus. 3. Minimal aortic atherosclerosis without aneurysm. Aortic Atherosclerosis (ICD10-I70.0). Electronically Signed   By: Ashley Royalty M.D.   On: 11/28/2016 13:55   Dg Chest Port 1 View  Result Date: 11/28/2016 CLINICAL DATA:  Altered mental status since last night, increased shortness of breath, cough, and congestion for past week, history dementia EXAM: PORTABLE CHEST 1 VIEW COMPARISON:  Portable exam 1145 hours compared to 06/22/2016 FINDINGS: Borderline enlargement of cardiac silhouette. Mediastinal  contours and pulmonary vascularity normal. Low lung volumes with bibasilar atelectasis. Remaining lungs clear. No definite pleural effusion or pneumothorax. IMPRESSION: Decreased lung volumes with bibasilar atelectasis. Electronically Signed   By: Lavonia Dana M.D.   On: 11/28/2016 12:04   Scheduled Meds: . acidophilus  1 capsule Oral Daily  . buPROPion  100 mg Oral Daily  . cholecalciferol  1,000 Units Oral Daily  . donepezil  23 mg Oral QHS  . dorzolamide-timolol  1 drop Left Eye BID  . enoxaparin (LOVENOX) injection  40 mg Subcutaneous Q24H  . insulin aspart  0-5 Units Subcutaneous QHS  . insulin aspart  0-9 Units Subcutaneous TID WC  . insulin aspart  3 Units Subcutaneous TID WC  . lisinopril  5 mg Oral Daily  . memantine  28 mg Oral Daily  . mirabegron ER  50  mg Oral Daily  . simvastatin  20 mg Oral Daily  . sodium chloride flush  3 mL Intravenous Q12H   Continuous Infusions: . sodium chloride       LOS: 1 day    Time spent: 25 minutes   Faye Ramsay, MD [facility] Pager [phone]  If 7PM-7AM, please contact night-coverage [email or web address] Password [email or web address] 11/29/2016, 1:36 PM

## 2016-11-29 NOTE — Progress Notes (Signed)
  Echocardiogram 2D Echocardiogram has been performed.  Jose [name] 11/29/2016, 12:47 PM

## 2016-11-29 NOTE — NC FL2 (Signed)
[place] LEVEL OF CARE SCREENING TOOL     IDENTIFICATION  Patient Name: Jose [name] Birthdate: 09-15-1930 Sex: male Admission Date (Current Location): 11/28/2016  [facility] [place] Number:  Herbalist and Address:  [facility],  [address] [place], [place]      Provider Number: [id number]  Attending Physician Name and Address:  Theodis Blaze, MD  Relative Name and Phone Number:       Current Level of Care: Hospital Recommended Level of Care: [place] Prior Approval Number:    Date Approved/Denied:   PASRR Number: [id number]  Discharge Plan: SNF    Current Diagnoses: Patient Active Problem List   Diagnosis Date Noted  . Acute on chronic respiratory failure with hypoxia ([place]) 11/28/2016  . Tachycardia 11/28/2016  . Heart failure ([place]) 11/28/2016  . Acute heart failure ([place]) 11/28/2016  . Major depression, recurrent, chronic ([place]) 11/23/2016  . OAB (overactive bladder) 11/23/2016  . Controlled type 2 diabetes mellitus without complication, without long-term current use of insulin ([place]) 09/28/2016  . Dermatitis 08/23/2016  . External hemorrhoid 07/26/2016  . Decubitus ulcer of buttock, stage 2 07/09/2016  . Depression with anxiety 07/09/2016  . Thrombocytopenia ([place]) 06/28/2016  . Rash 06/25/2016  . Urinary frequency 06/25/2016  . Closed left hip fracture, sequela 06/22/2016  . Type 2 diabetes mellitus with diabetic chronic kidney disease ([place]) 06/22/2016  . Hyperlipidemia LDL goal <70 06/22/2016  . Leukocytosis 06/22/2016  . Alzheimer's dementia without behavioral disturbance 11/12/2012  . History of bilateral hip replacements 11/12/2012    Orientation RESPIRATION BLADDER Height & Weight     Self, Place  Normal Incontinent, External catheter Weight: 179 lb 0.2 oz (81.2 kg) Height:  6\' 1"  (185.4 cm)  BEHAVIORAL SYMPTOMS/MOOD NEUROLOGICAL BOWEL NUTRITION STATUS      Continent Diet (regular diet  thin fluid consistency)  AMBULATORY STATUS COMMUNICATION OF NEEDS Skin   Extensive Assist Verbally Normal                       Personal Care Assistance Level of Assistance  Bathing, Feeding, Dressing Bathing Assistance: Maximum assistance Feeding assistance: Limited assistance Dressing Assistance: Limited assistance     Functional Limitations Info  Sight, Hearing, Speech Sight Info: Adequate Hearing Info: Adequate Speech Info: Adequate    SPECIAL CARE FACTORS FREQUENCY  PT (By licensed PT), OT (By licensed OT)     PT Frequency: 3x OT Frequency: 3x            Contractures Contractures Info: Not present    Additional Factors Info  Code Status, Allergies Code Status Info: full code Allergies Info: Axona Flora-q, Gabapentin           Current Medications (11/29/2016):  This is the current hospital active medication list Current Facility-Administered Medications  Medication Dose Route Frequency Provider Last Rate Last Dose  . 0.9 %  sodium chloride infusion  250 mL Intravenous PRN Charlynne Cousins, MD      . acetaminophen (TYLENOL) tablet 650 mg  650 mg Oral Q4H PRN Charlynne Cousins, MD      . acetaminophen (TYLENOL) tablet 650 mg  650 mg Oral Q4H PRN Charlynne Cousins, MD      . acidophilus (RISAQUAD) capsule 1 capsule  1 capsule Oral Daily Charlynne Cousins, MD      . buPROPion [facility] SR) 12 hr tablet 100 mg  100 mg Oral Daily Charlynne Cousins, MD      .  cholecalciferol (VITAMIN D) tablet 1,000 Units  1,000 Units Oral Daily Charlynne Cousins, MD      . donepezil (ARICEPT) tablet 23 mg  23 mg Oral QHS Charlynne Cousins, MD   23 mg at 11/28/16 2247  . dorzolamide-timolol (COSOPT) 22.3-6.8 MG/ML ophthalmic solution 1 drop  1 drop Left Eye BID Charlynne Cousins, MD   1 drop at 11/28/16 2248  . enoxaparin (LOVENOX) injection 40 mg  40 mg Subcutaneous Q24H Charlynne Cousins, MD   40 mg at 11/28/16 2249  . furosemide (LASIX) injection  20 mg  20 mg Intravenous Q12H Charlynne Cousins, MD   20 mg at 11/28/16 2250  . haloperidol lactate (HALDOL) injection 1 mg  1 mg Intravenous Q6H PRN Charlynne Cousins, MD      . hydrocortisone (ANUSOL-HC) 2.5 % rectal cream 1 application  1 application Rectal QHS PRN Charlynne Cousins, MD      . insulin aspart (novoLOG) injection 0-5 Units  0-5 Units Subcutaneous QHS Charlynne Cousins, MD      . insulin aspart (novoLOG) injection 0-9 Units  0-9 Units Subcutaneous TID WC Charlynne Cousins, MD   1 Units at 11/29/16 [phone]  . insulin aspart (novoLOG) injection 3 Units  3 Units Subcutaneous TID WC Charlynne Cousins, MD   3 Units at 11/29/16 0805  . lisinopril (PRINIVIL,ZESTRIL) tablet 5 mg  5 mg Oral Daily Charlynne Cousins, MD   5 mg at 11/28/16 2247  . memantine (NAMENDA XR) 24 hr capsule 28 mg  28 mg Oral Daily Charlynne Cousins, MD      . mirabegron ER [facility]) tablet 50 mg  50 mg Oral Daily Charlynne Cousins, MD      . ondansetron [facility]) injection 4 mg  4 mg Intravenous Q6H PRN Charlynne Cousins, MD      . simvastatin (ZOCOR) tablet 20 mg  20 mg Oral Daily Charlynne Cousins, MD      . sodium chloride flush (NS) 0.9 % injection 3 mL  3 mL Intravenous Q12H Charlynne Cousins, MD   3 mL at 11/28/16 2251  . sodium chloride flush (NS) 0.9 % injection 3 mL  3 mL Intravenous PRN Charlynne Cousins, MD      . traMADol [facility]) tablet 50 mg  50 mg Oral Q6H PRN Charlynne Cousins, MD         Discharge Medications: Please see discharge summary for a list of discharge medications.  Relevant Imaging Results:  Relevant Lab Results:   Additional Information SS# [id number]  Nila Nephew, LCSW

## 2016-11-29 NOTE — Consult Note (Signed)
Cardiology Consultation:   Patient ID: Jose [name]; [id number]; [birth year]   Admit date: 11/28/2016 Date of Consult: 11/29/2016  Primary Care Provider: [name], MD Primary Cardiologist: New Primary Electrophysiologist:  NA   Patient Profile:   Jose [name] is a 83 y.o. male with a hx of spinal stenosis, arthritis, dementia and diabetes-2  who is being seen today for the evaluation of heart failure with acute on  chronic resp failure at the request of Dr. Doyle Askew.  History of Present Illness:   Jose [name] has a hx of spinal stenosis, arthritis, advanced dementia and diabetes-2  who is being seen today for the evaluation of heart failure with acute on chronic chronic resp failure. Pt was sent from [place] with decreased sp02 to 88% on RA, with hx of productive cough for 1 week.   EMS placed him on CPAP.    In ER K+ was 5.2, Na 134, BUN 13 and Cr 1.09 AST 60 today K+ 3.3 and Cr 1.11 Na 138 after lasix 20 mg Mg+ 1.8  BNP on admit 994 Troponin POC 0.16 and follow up troponin 0.17 and 0.15 WBC on admit 17.1 today 11.2---plts 145 now 155 Ddimer 1.33 with neg PE on CTA  CXR with decreased lung volumes  CTA of chest without PE, + bil pl effusions small to moderate, minimal aortic atherosclerosis without aneurysm.   EKGs I personally reviewed with SR nonspecific lateral ST depression and PVC, QTC 499 ms not significantly changed from 2016.   Today EKGs SR and no acute changes  Tele personally reviewed SR    Currently resting comfortably.  No chest pain and no SOB. He just had bath and walked in hall, very weak.   His SOB was sudden had been fine when his wife left then by yesterday AM they called that he was going to ER.  + 420 with IV lasix.   Past Medical History:  Diagnosis Date  . Abnormality of gait   . Backache, unspecified   . Colon polyps    adenomatous  . Dementia 11/12/2012  . Diverticulosis   . Essential and other specified forms of tremor   . Hemorrhoids   .  History of bilateral hip replacements 11/12/2012  . Memory loss   . 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 Pain in joint, pelvic region and thigh   . Skin cancer of scalp   . Spinal stenosis, lumbar region, without neurogenic claudication   . Unspecified hereditary and idiopathic peripheral neuropathy     Past Surgical History:  Procedure Laterality Date  . CATARACT EXTRACTION, BILATERAL  2012  . RETINAL LASER PROCEDURE  2012   retinal wrinkle  . SKIN CANCER EXCISION    . TOTAL HIP ARTHROPLASTY Left 2000  . TOTAL HIP ARTHROPLASTY (aka REPLACEMENT) Right 2011     Home Medications:  Prior to Admission medications   Medication Sig Start Date End Date Taking? Authorizing Provider  acetaminophen (TYLENOL) 325 MG tablet Take 650 mg by mouth every 4 (four) hours as needed for fever.   Yes [provider]  acetaminophen (TYLENOL) 500 MG tablet Take 500 mg by mouth every 8 (eight) hours as needed (pain).    Yes [provider]  buPROPion (WELLBUTRIN SR) 100 MG 12 hr tablet Take 100 mg by mouth daily. 11/23/16  Yes [provider]  Cholecalciferol (VITAMIN D) 1000 UNITS capsule Take 1,000 Units by mouth daily.     Yes  [provider]  dextromethorphan (DELSYM) 30 MG/5ML liquid Take 60 mg by mouth every 12 (twelve) hours as needed for cough. Take 10 mL   Yes [provider]  donepezil (ARICEPT) 23 MG TABS tablet Take 1 tablet (23 mg total) by mouth daily. 10/19/15  Yes Star Age, MD  dorzolamide-timolol (COSOPT) 22.3-6.8 MG/ML ophthalmic solution Place 1 drop into the left eye 2 (two) times daily.  09/20/12  Yes [provider]  Emollient (CERAVE EX) Apply 1 application topically daily.   Yes [provider]  Emollient (CERAVE) CREA Apply 1 application topically daily. Apply to arms, legs and trunk   Yes [provider]  Flaxseed, Linseed, (FLAXSEED OIL) 1000 MG CAPS Take 1,000 mg by mouth  daily.     Yes [provider]  folic acid (FOLVITE) 448 MCG tablet Take 800 mcg by mouth every evening.    Yes [provider]  hydrocortisone (PROCTOSOL HC) 2.5 % rectal cream Place 1 application rectally at bedtime as needed for hemorrhoids or anal itching.   Yes [provider]  hydrocortisone cream 1 % Apply 1 application topically 2 (two) times daily.   Yes [provider]  loperamide (IMODIUM A-D) 2 MG tablet Take 2 mg by mouth 4 (four) times daily as needed for diarrhea or loose stools.    Yes [provider]  memantine (NAMENDA XR) 28 MG CP24 24 hr capsule Take 1 capsule (28 mg total) by mouth daily. 09/27/15  Yes Star Age, MD  metFORMIN (GLUCOPHAGE) 500 MG tablet Take 500 mg by mouth daily.  10/25/12  Yes [provider]  mirabegron ER (MYRBETRIQ) 50 MG TB24 tablet Take 50 mg by mouth daily.   Yes [provider]  Multiple Vitamin (MULTIVITAMIN) tablet Take 1 tablet by mouth daily.     Yes [provider]  nystatin (NYSTATIN) powder Apply topically 2 (two) times daily. Apply to right and left buttocks.   Yes [provider]  potassium chloride (KLOR-CON) 10 MEQ CR tablet Take 10 mEq by mouth 2 (two) times a week. On mondays and fridays   Yes [provider]  Probiotic Product (ALIGN) 4 MG CAPS Take 4 mg by mouth daily.   Yes [provider]  simvastatin (ZOCOR) 20 MG tablet Take 20 mg by mouth daily.   Yes [provider]  traMADol (ULTRAM) 50 MG tablet Take 50 mg by mouth every 6 (six) hours as needed.    Yes [provider]  white petrolatum ointment Apply 1 application topically daily.   Yes [provider]    Inpatient Medications: Scheduled Meds: . acidophilus  1 capsule Oral Daily  . buPROPion  100 mg Oral Daily  . cholecalciferol  1,000 Units Oral Daily  . donepezil  23 mg Oral QHS  . dorzolamide-timolol  1 drop Left Eye BID  . enoxaparin (LOVENOX)  injection  40 mg Subcutaneous Q24H  . furosemide  20 mg Intravenous Q12H  . insulin aspart  0-5 Units Subcutaneous QHS  . insulin aspart  0-9 Units Subcutaneous TID WC  . insulin aspart  3 Units Subcutaneous TID WC  . lisinopril  5 mg Oral Daily  . memantine  28 mg Oral Daily  . mirabegron ER  50 mg Oral Daily  . simvastatin  20 mg Oral Daily  . sodium chloride flush  3 mL Intravenous Q12H   Continuous Infusions: . sodium chloride     PRN Meds: sodium chloride, acetaminophen, acetaminophen, haloperidol lactate,  hydrocortisone, ondansetron (ZOFRAN) IV, sodium chloride flush, traMADol  Allergies:    Allergies  Allergen Reactions  . Axona [Flora-Q]     Unknown per MAR  . Gabapentin     Hallucinations    Social History:   Social History   Social History  . Marital status: Married    Spouse name: Jose [name]  . Number of children: 2  . Years of education: lawyer   Occupational History  . Retired [employer]    retired   Social History Main Topics  . Smoking status: Former Smoker    Types: Cigarettes  . Smokeless tobacco: Never Used  . Alcohol use 8.4 oz/week    14 Glasses of wine per week     Comment: occas, one glass of wine before dinner,sometimes 1/2 glass  . Drug use: No  . Sexual activity: No   Other Topics Concern  . Not on file   Social History Narrative   Patient is right handed and resides in home with wife    Family History:    Family History  Problem Relation Age of Onset  . Heart failure Mother      ROS: infor from wife, pt does not remember Please see the history of present illness.  ROS  General:no colds or fevers, no weight changes, + cough but non productive Skin:no rashes or ulcers HEENT:no blurred vision, no congestion CV:see HPI PUL:see HPI GI:no diarrhea constipation or melena, no indigestion GU:no hematuria, no dysuria MS:no joint pain, no claudication Neuro:no syncope, no lightheadedness Endo:+ diabetes, no thyroid disease       Physical Exam/Data:   Vitals:   11/28/16 2100 11/28/16 2130 11/28/16 2203 11/29/16 0520  BP: 126/84 (!) 114/92 128/83 122/76  Pulse: 88 89 87 80  Resp: (!) 25 18 20 18   Temp:   97.6 F (36.4 C) (!) 97.4 F (36.3 C)  TempSrc:   Axillary Oral  SpO2: 100% 99% 100% 100%  Weight:   179 lb 0.2 oz (81.2 kg) 179 lb 0.2 oz (81.2 kg)  Height:   6\' 1"  (1.854 m)     Intake/Output Summary (Last 24 hours) at 11/29/16 0912 Last data filed at 11/29/16 0600  Gross per 24 hour  Intake              620 ml  Output              200 ml  Net              420 ml   Filed Weights   11/28/16 1131 11/28/16 2203 11/29/16 0520  Weight: 182 lb (82.6 kg) 179 lb 0.2 oz (81.2 kg) 179 lb 0.2 oz (81.2 kg)   Body mass index is 23.62 kg/m.  General:  Well nourished, well developed, in no acute distress HEENT: normal Lymph: no adenopathy Neck: no JVD Endocrine:  No thryomegaly Vascular: No carotid bruits; 2+ post tibs bil Cardiac:  normal S1, S2; RRR; no murmur gallup rub or click Lungs:  clear to auscultation bilaterally, no wheezing, rhonchi or rales  Abd: soft, nontender, no hepatomegaly  Ext: no edema Musculoskeletal:  No deformities, BUE and BLE strength normal and equal Skin: warm and dry  Neuro:  Oriented to name and family, + facial symmetry Psych:  Normal affect    Relevant CV Studies: Old EKGs otherwise none.  Laboratory Data:  Chemistry Recent Labs Lab 11/28/16 1129 11/28/16 2315 11/29/16 0232  NA 134*  --  138  K 5.2*  --  3.3*  CL 105  --  103  CO2 21*  --  28  GLUCOSE 234*  --  184*  BUN 13  --  15  CREATININE 1.09 0.94 1.11  CALCIUM 8.4*  --  8.7*  GFRNONAA >60 >60 59*  GFRAA >60 >60 >60  ANIONGAP 8  --  7     Recent Labs Lab 11/28/16 1129  PROT 5.9*  ALBUMIN 3.1*  AST 60*  ALT 37  ALKPHOS 73  BILITOT 1.5*   Hematology Recent Labs Lab 11/28/16 1129 11/28/16 2315  WBC 17.1* 11.2*  RBC 4.28 4.27  HGB 14.0 13.8  HCT 40.4 40.3  MCV 94.4 94.4  MCH 32.7  32.3  MCHC 34.7 34.2  RDW 13.2 13.2  PLT 145* 155   Cardiac Enzymes Recent Labs Lab 11/28/16 2315 11/29/16 0232  TROPONINI 0.17* 0.15*    Recent Labs Lab 11/28/16 1137  TROPIPOC 0.16*    BNP Recent Labs Lab 11/28/16 1129  BNP 994.3*    DDimer  Recent Labs Lab 11/28/16 1129  DDIMER 1.33*    Radiology/Studies:  Ct Angio Chest Pe W/cm &/or Wo Cm  Result Date: 11/28/2016 CLINICAL DATA:  Altered mentation, increasing dyspnea, cough and congestion for the past week, positive D-dimer. EXAM: CT ANGIOGRAPHY CHEST WITH CONTRAST TECHNIQUE: Multidetector CT imaging of the chest was performed using the standard protocol during bolus administration of intravenous contrast. Multiplanar CT image reconstructions and MIPs were obtained to evaluate the vascular anatomy. CONTRAST:  100 cc Isovue 370 IV COMPARISON:  11/28/2016 CXR FINDINGS: Cardiovascular: The study is of quality for the evaluation of pulmonary embolism. There are no filling defects in the central, lobar, and segmental pulmonary arteries to suggest acute pulmonary embolism. Great vessels are normal in course and caliber. Normal heart size. No significant pericardial fluid/thickening. Minimal aortic atherosclerosis without aneurysm. Mediastinum/Nodes: No discrete thyroid nodules. Unremarkable esophagus. No pathologically enlarged axillary, mediastinal or hilar lymph nodes. Lungs/Pleura: No pneumothorax. No pleural effusion. Small to moderate bilateral pleural effusions with bibasilar compressive and subsegmental atelectasis. Dependent atelectasis is noted along the posterior aspects of both upper lobes. Upper abdomen: Unremarkable. Musculoskeletal: No aggressive appearing focal osseous lesions. There is spondylosis of the included cervical and thoracic spine. No osseous appearing abnormality. Review of the MIP images confirms the above findings. IMPRESSION: 1. Small to moderate bilateral pleural effusions with adjacent atelectasis. 2. No  acute pulmonary embolus. 3. Minimal aortic atherosclerosis without aneurysm. Aortic Atherosclerosis (ICD10-I70.0). Electronically Signed   By: Ashley Royalty M.D.   On: 11/28/2016 13:55   Dg Chest Port 1 View  Result Date: 11/28/2016 CLINICAL DATA:  Altered mental status since last night, increased shortness of breath, cough, and congestion for past week, history dementia EXAM: PORTABLE CHEST 1 VIEW COMPARISON:  Portable exam 1145 hours compared to 06/22/2016 FINDINGS: Borderline enlargement of cardiac silhouette. Mediastinal contours and pulmonary vascularity normal. Low lung volumes with bibasilar atelectasis. Remaining lungs clear. No definite pleural effusion or pneumothorax. IMPRESSION: Decreased lung volumes with bibasilar atelectasis. Electronically Signed   By: Lavonia Dana M.D.   On: 11/28/2016 12:04    Assessment and Plan:   1. CHF has been given IV lasix but I&O + - actually stable currently-- Echo has been ordered.  Dr. Johnsie Cancel to see.  2. Elevated troponin, may be demand ischemia more of flat trend.   3. Acute on chronic respiratory failure with CHF- echo to eval EF.  resp were 34 on admit HR 128 sp02 was 80% 4. Alzheimer's dementia  today pleasant but cannot give hx. 5. DM-2 per IM    Signed, Cecilie Kicks, NP  11/29/2016 9:12 AM   Patient examined chart reviewed discussed care with daughter and wife. Exam with significant dementia. SEM aortic sclerosis, decreased BS base no edema. No chest pain and given age and dementia no ischemic work up indicated Discussed echo with tech to make sure it gets done. More likely diastolic dysfunction Continue lasix 20 mg iv q 12 for now Further w/u pending echo results  Jose [name]

## 2016-11-29 NOTE — Progress Notes (Signed)
Received call from [facility] confirming pt is resident at [facility] and has bed to return to when stable. (previous CSW assessment noted pt was from [facility] however this was entered in error.) CSW selected [facility] in the [place] and will complete FL2.  Sharren Bridge, MSW, LCSW Clinical Social Work 11/29/2016 [phone]

## 2016-11-29 NOTE — Progress Notes (Signed)
CRITICAL VALUE ALERT  Critical Value:  Troponin 0.17  Date & Time Notied:  11/29/2016 0006  Provider Notified: Lamar Blinks NP  Orders Received/Actions taken:

## 2016-11-30 DIAGNOSIS — I5021 Acute systolic (congestive) heart failure: Secondary | ICD-10-CM

## 2016-11-30 DIAGNOSIS — I509 Heart failure, unspecified: Secondary | ICD-10-CM | POA: Diagnosis not present

## 2016-11-30 LAB — BASIC METABOLIC PANEL
Anion gap: 7 (ref 5–15)
BUN: 20 mg/dL (ref 6–20)
CHLORIDE: 106 mmol/L (ref 101–111)
CO2: 27 mmol/L (ref 22–32)
CREATININE: 0.99 mg/dL (ref 0.61–1.24)
Calcium: 8.5 mg/dL — ABNORMAL LOW (ref 8.9–10.3)
GFR calc Af Amer: >60 mL/min (ref >60)
GFR calc non Af Amer: >60 mL/min (ref >60)
GLUCOSE: 135 mg/dL — AB (ref 65–99)
Potassium: 3.7 mmol/L (ref 3.5–5.1)
Sodium: 140 mmol/L (ref 135–145)

## 2016-11-30 LAB — CBC
HEMATOCRIT: 37.5 % — AB (ref 39.0–52.0)
Hemoglobin: 12.7 g/dL — ABNORMAL LOW (ref 13.0–17.0)
MCH: 32.4 pg (ref 26.0–34.0)
MCHC: 33.9 g/dL (ref 30.0–36.0)
MCV: 95.7 fL (ref 78.0–100.0)
PLATELETS: 128 10*3/uL — AB (ref 150–400)
RBC: 3.92 MIL/uL — ABNORMAL LOW (ref 4.22–5.81)
RDW: 13.4 % (ref 11.5–15.5)
WBC: 9.8 10*3/uL (ref 4.0–10.5)

## 2016-11-30 LAB — GLUCOSE, CAPILLARY
Glucose-Capillary: 146 mg/dL — ABNORMAL HIGH (ref 65–99)
Glucose-Capillary: 220 mg/dL — ABNORMAL HIGH (ref 65–99)

## 2016-11-30 MED ORDER — LISINOPRIL 5 MG PO TABS: 5.0000 mg | ORAL_TABLET | Freq: Every day | ORAL | 1 refills | Status: DC

## 2016-11-30 MED ORDER — FUROSEMIDE 20 MG PO TABS
20.0000 mg | ORAL_TABLET | Freq: Every day | ORAL | Status: DC
  Administered 2016-11-30: 20 mg via ORAL
  Filled 2016-11-30: qty 1

## 2016-11-30 MED ORDER — FUROSEMIDE 20 MG PO TABS: 20.0000 mg | ORAL_TABLET | Freq: Every day | ORAL | 1 refills | Status: DC

## 2016-11-30 MED ORDER — CARVEDILOL 3.125 MG PO TABS: 3.1250 mg | ORAL_TABLET | Freq: Two times a day (BID) | ORAL | Status: DC

## 2016-11-30 MED ORDER — CARVEDILOL 3.125 MG PO TABS: 3.1250 mg | ORAL_TABLET | Freq: Two times a day (BID) | ORAL | 1 refills | Status: DC

## 2016-11-30 NOTE — Discharge Summary (Signed)
Physician Discharge Summary  Jose [name] HGD:[id number] DOB: 28-Mar-1931 DOA: 11/28/2016  PCP: [name], MD  Admit date: 11/28/2016 Discharge date: 11/30/2016  Recommendations for Outpatient Follow-up:  1. Pt will need to follow up with PCP in 1-2 weeks post discharge 2. Please obtain BMP to evaluate electrolytes and kidney function 3. Please also check CBC to evaluate Hg and Hct levels 4. Pt started on beta blocker and ACEI, lasix per cardiology   Discharge Diagnoses:  Active Problems:   Alzheimer's dementia without behavioral disturbance   Leukocytosis   Thrombocytopenia (HCC)   Controlled type 2 diabetes mellitus without complication, without long-term current use of insulin (HCC)   Acute on chronic respiratory failure with hypoxia (HCC)   Tachycardia   Heart failure (HCC)   Acute heart failure ([place])  Discharge Condition: Stable  Diet recommendation: Heart healthy diet discussed in details   History of present illness:  83 y.o.malewith advanced dementia, bilateral hip replacement, diabetes mellitus with last A1c of 6.1, presented with dyspnea.  Assessment & Plan:  Acute systolic and diastolic CHF - pt clinically stable this AM, denies chest pain or dyspnea  - ECHO with EF 30-35% - appreciate cardiology input - keep on Lasix on discharge  - started beta blocker and ACEI  Elevated trop - demand ischemia - no chest pain this AM   Hypokalemia - supplemented and WNL this AM  Alzheimer's dementia - pt has personal care giver at bedside  - continue home medical regimen    DM type II - resume home medical regimen   Thrombocytopenia - mild, reactive  Leukocytosis - suspect reactive - resolved    DVT prophylaxis: Lovenox SQ Code Status: Full  Family Communication: Patient at bedside, wife at bedside Disposition Plan: home [facility] [place] [facility] facility   Consultants:   Cardio  Procedures:   None  Antimicrobials:   None     Procedures/Studies: Ct Angio Chest Pe W/cm &/or Wo Cm  Result Date: 11/28/2016 CLINICAL DATA:  Altered mentation, increasing dyspnea, cough and congestion for the past week, positive D-dimer. EXAM: CT ANGIOGRAPHY CHEST WITH CONTRAST TECHNIQUE: Multidetector CT imaging of the chest was performed using the standard protocol during bolus administration of intravenous contrast. Multiplanar CT image reconstructions and MIPs were obtained to evaluate the vascular anatomy. CONTRAST:  100 cc Isovue 370 IV COMPARISON:  11/28/2016 CXR FINDINGS: Cardiovascular: The study is of quality for the evaluation of pulmonary embolism. There are no filling defects in the central, lobar, and segmental pulmonary arteries to suggest acute pulmonary embolism. Great vessels are normal in course and caliber. Normal heart size. No significant pericardial fluid/thickening. Minimal aortic atherosclerosis without aneurysm. Mediastinum/Nodes: No discrete thyroid nodules. Unremarkable esophagus. No pathologically enlarged axillary, mediastinal or hilar lymph nodes. Lungs/Pleura: No pneumothorax. No pleural effusion. Small to moderate bilateral pleural effusions with bibasilar compressive and subsegmental atelectasis. Dependent atelectasis is noted along the posterior aspects of both upper lobes. Upper abdomen: Unremarkable. Musculoskeletal: No aggressive appearing focal osseous lesions. There is spondylosis of the included cervical and thoracic spine. No osseous appearing abnormality. Review of the MIP images confirms the above findings. IMPRESSION: 1. Small to moderate bilateral pleural effusions with adjacent atelectasis. 2. No acute pulmonary embolus. 3. Minimal aortic atherosclerosis without aneurysm. Aortic Atherosclerosis (ICD10-I70.0). Electronically Signed   By: Ashley Royalty M.D.   On: 11/28/2016 13:55   Dg Chest Port 1 View  Result Date: 11/28/2016 CLINICAL DATA:  Altered mental status since last night, increased shortness of  breath, cough,  and congestion for past week, history dementia EXAM: PORTABLE CHEST 1 VIEW COMPARISON:  Portable exam 1145 hours compared to 06/22/2016 FINDINGS: Borderline enlargement of cardiac silhouette. Mediastinal contours and pulmonary vascularity normal. Low lung volumes with bibasilar atelectasis. Remaining lungs clear. No definite pleural effusion or pneumothorax. IMPRESSION: Decreased lung volumes with bibasilar atelectasis. Electronically Signed   By: Lavonia Dana M.D.   On: 11/28/2016 12:04   Discharge Exam: Vitals:   11/30/16 0626 11/30/16 1308  BP: 125/78 128/71  Pulse: 78 82  Resp: 18 18  Temp: 98.3 F (36.8 C) 98.1 F (36.7 C)  SpO2: 93% 96%   Vitals:   11/29/16 0915 11/29/16 2030 11/30/16 0626 11/30/16 1308  BP: 95/60 112/62 125/78 128/71  Pulse: 86 87 78 82  Resp: 16 18 18 18   Temp: 97.7 F (36.5 C) 98.1 F (36.7 C) 98.3 F (36.8 C) 98.1 F (36.7 C)  TempSrc: Oral Oral Oral Oral  SpO2: 92% 97% 93% 96%  Weight:      Height:        General: Pt is alert, follows commands appropriately, not in acute distress Cardiovascular: Regular rate and rhythm, S1/S2 +, no murmurs, no rubs, no gallops Respiratory: Clear to auscultation bilaterally, no wheezing, no crackles, no rhonchi Abdominal: Soft, non tender, non distended, bowel sounds +, no guarding Extremities: no edema, no cyanosis, pulses palpable bilaterally DP and PT   Discharge Instructions  Discharge Instructions    Diet - low sodium heart healthy    Complete by:  As directed    Increase activity slowly    Complete by:  As directed      Allergies as of 11/30/2016      Reactions   Axona [flora-q]    Unknown per MAR   Gabapentin    Hallucinations      Medication List    STOP taking these medications   traMADol 50 MG tablet Commonly known as:  ULTRAM     TAKE these medications   acetaminophen 325 MG tablet Commonly known as:  TYLENOL Take 650 mg by mouth every 4 (four) hours as needed for  fever.   acetaminophen 500 MG tablet Commonly known as:  TYLENOL Take 500 mg by mouth every 8 (eight) hours as needed (pain).   ALIGN 4 MG Caps Take 4 mg by mouth daily.   buPROPion 100 MG 12 hr tablet Commonly known as:  WELLBUTRIN SR Take 100 mg by mouth daily.   carvedilol 3.125 MG tablet Commonly known as:  COREG Take 1 tablet (3.125 mg total) by mouth 2 (two) times daily with a meal.   CERAVE Crea Apply 1 application topically daily. Apply to arms, legs and trunk   CERAVE EX Apply 1 application topically daily.   DELSYM 30 MG/5ML liquid Generic drug:  dextromethorphan Take 60 mg by mouth every 12 (twelve) hours as needed for cough. Take 10 mL   donepezil 23 MG Tabs tablet Commonly known as:  ARICEPT Take 1 tablet (23 mg total) by mouth daily.   dorzolamide-timolol 22.3-6.8 MG/ML ophthalmic solution Commonly known as:  COSOPT Place 1 drop into the left eye 2 (two) times daily.   Flaxseed Oil 1000 MG Caps Take 1,000 mg by mouth daily.   folic acid 301 MCG tablet Commonly known as:  FOLVITE Take 800 mcg by mouth every evening.   furosemide 20 MG tablet Commonly known as:  LASIX Take 1 tablet (20 mg total) by mouth daily.   hydrocortisone cream 1 % Apply  1 application topically 2 (two) times daily.   lisinopril 5 MG tablet Commonly known as:  PRINIVIL,ZESTRIL Take 1 tablet (5 mg total) by mouth daily.   loperamide 2 MG tablet Commonly known as:  IMODIUM A-D Take 2 mg by mouth 4 (four) times daily as needed for diarrhea or loose stools.   memantine 28 MG Cp24 24 hr capsule Commonly known as:  NAMENDA XR Take 1 capsule (28 mg total) by mouth daily.   metFORMIN 500 MG tablet Commonly known as:  GLUCOPHAGE Take 500 mg by mouth daily.   multivitamin tablet Take 1 tablet by mouth daily.   MYRBETRIQ 50 MG Tb24 tablet Generic drug:  mirabegron ER Take 50 mg by mouth daily.   nystatin powder Generic drug:  nystatin Apply topically 2 (two) times daily.  Apply to right and left buttocks.   potassium chloride 10 MEQ CR tablet Commonly known as:  KLOR-CON Take 10 mEq by mouth 2 (two) times a week. On mondays and fridays   PROCTOSOL HC 2.5 % rectal cream Generic drug:  hydrocortisone Place 1 application rectally at bedtime as needed for hemorrhoids or anal itching.   simvastatin 20 MG tablet Commonly known as:  ZOCOR Take 20 mg by mouth daily.   Vitamin D 1000 units capsule Take 1,000 Units by mouth daily.   white petrolatum ointment Apply 1 application topically daily.            Discharge Care Instructions        Start     Ordered   12/01/16 0000  furosemide (LASIX) 20 MG tablet  Daily     11/30/16 1318   12/01/16 0000  lisinopril (PRINIVIL,ZESTRIL) 5 MG tablet  Daily     11/30/16 1318   11/30/16 0000  carvedilol (COREG) 3.125 MG tablet  2 times daily with meals     11/30/16 1318   11/30/16 0000  Increase activity slowly     11/30/16 1318   11/30/16 0000  Diet - low sodium heart healthy     11/30/16 1318     Follow-up Information    [name], MD Follow up.   Specialty:  Internal Medicine Contact information: [place] [address] [phone]        [name], MD. Schedule an appointment as soon as possible for a visit.   Specialty:  Cardiology Contact information: [address] [place] [address] [phone]            The results of significant diagnostics from this hospitalization (including imaging, microbiology, ancillary and laboratory) are listed below for reference.     Microbiology: Recent Results (from the past 240 hour(s))  MRSA PCR Screening     Status: None   Collection Time: 11/29/16  5:00 AM  Result Value Ref Range Status   MRSA by PCR NEGATIVE NEGATIVE Final    Comment:        The GeneXpert MRSA Assay (FDA approved for NASAL specimens only), is one component of a comprehensive MRSA colonization surveillance program. It is  not intended to diagnose MRSA infection nor to guide or monitor treatment for MRSA infections.      Labs: Basic Metabolic Panel:  Recent Labs Lab 11/28/16 1129 11/28/16 2315 11/29/16 0232 11/30/16 0429  NA 134*  --  138 140  K 5.2*  --  3.3* 3.7  CL 105  --  103 106  CO2 21*  --  28 27  GLUCOSE 234*  --  184* 135*  BUN 13  --  15 20  CREATININE 1.09 0.94 1.11 0.99  CALCIUM 8.4*  --  8.7* 8.5*  MG  --  1.8  --   --    Liver Function Tests:  Recent Labs Lab 11/28/16 1129  AST 60*  ALT 37  ALKPHOS 73  BILITOT 1.5*  PROT 5.9*  ALBUMIN 3.1*   CBC:  Recent Labs Lab 11/28/16 1129 11/28/16 2315 11/30/16 0429  WBC 17.1* 11.2* 9.8  NEUTROABS 14.9*  --   --   HGB 14.0 13.8 12.7*  HCT 40.4 40.3 37.5*  MCV 94.4 94.4 95.7  PLT 145* 155 128*   Cardiac Enzymes:  Recent Labs Lab 11/28/16 2315 11/29/16 0232 11/29/16 0834  TROPONINI 0.17* 0.15* 0.09*   BNP: BNP (last 3 results)  Recent Labs  11/28/16 1129  BNP 994.3*    CBG:  Recent Labs Lab 11/29/16 1248 11/29/16 1748 11/29/16 2023 11/30/16 0732 11/30/16 1215  GLUCAP 162* 114* 258* 146* 220*     SIGNED: Time coordinating discharge: 60 minutes  Faye Ramsay, MD  [facility] 11/30/2016, 1:18 PM Pager [phone]  If 7PM-7AM, please contact night-coverage [email or web address] Password [email or web address]

## 2016-11-30 NOTE — Progress Notes (Signed)
Patient ID: Jose [name], male   DOB: January 19, 1931, 83 y.o.   MRN: [id number]    PROGRESS NOTE  Jose [name]  CHY:[id number] DOB: 21-Sep-1930 DOA: 11/28/2016  PCP: [name], MD   Brief Narrative:  83 y.o. male with advanced dementia, bilateral hip replacement, diabetes mellitus with last A1c of 6.1, presented with dyspnea.  Assessment & Plan:  Acute systolic and diastolic CHF - pt clinically stable this AM, denies chest pain or dyspnea  - ECHO with EF 30-35% - appreciate cardiology input - keep on Lasix 20 mg IV BID - follow up on cardiology recommendations   Elevated trop - demand ischemia - no chest pain this AM  - management per cardiology   Hypokalemia - supplemented and WNL this AM - BMP in AM  Alzheimer's dementia - pt has personal care giver at bedside    DM type II - keep on SSI  Thrombocytopenia - mild, reactive - will monitor   Leukocytosis - suspect reactive - resolved    DVT prophylaxis: Lovenox SQ Code Status: Full  Family Communication: Patient at bedside, wife at bedside Disposition Plan: when cardiology clears   Consultants:   Cardio  Procedures:   None  Antimicrobials:   None   Subjective: No chest pain this AM.   Objective: Vitals:   11/29/16 0520 11/29/16 0915 11/29/16 2030 11/30/16 0626  BP: 122/76 95/60 112/62 125/78  Pulse: 80 86 87 78  Resp: 18 16 18 18   Temp: (!) 97.4 F (36.3 C) 97.7 F (36.5 C) 98.1 F (36.7 C) 98.3 F (36.8 C)  TempSrc: Oral Oral Oral Oral  SpO2: 100% 92% 97% 93%  Weight: 81.2 kg (179 lb 0.2 oz)     Height:        Intake/Output Summary (Last 24 hours) at 11/30/16 0939 Last data filed at 11/30/16 8786  Gross per 24 hour  Intake              268 ml  Output             1050 ml  Net             -782 ml   Filed Weights   11/28/16 1131 11/28/16 2203 11/29/16 0520  Weight: 82.6 kg (182 lb) 81.2 kg (179 lb 0.2 oz) 81.2 kg (179 lb 0.2 oz)    Examination:  General exam: Appears calm and  comfortable  Respiratory system: Clear to auscultation. Respiratory effort normal. Cardiovascular system: S1 & S2 heard, RRR. No JVD, murmurs, rubs, gallops or clicks. No pedal edema. Gastrointestinal system: Abdomen is nondistended, soft and nontender. No organomegaly or masses felt.  Central nervous system: Alert, follows commands appropriately   Data Reviewed: I have personally reviewed following labs and imaging studies  CBC:  Recent Labs Lab 11/28/16 1129 11/28/16 2315 11/30/16 0429  WBC 17.1* 11.2* 9.8  NEUTROABS 14.9*  --   --   HGB 14.0 13.8 12.7*  HCT 40.4 40.3 37.5*  MCV 94.4 94.4 95.7  PLT 145* 155 767*   Basic Metabolic Panel:  Recent Labs Lab 11/28/16 1129 11/28/16 2315 11/29/16 0232 11/30/16 0429  NA 134*  --  138 140  K 5.2*  --  3.3* 3.7  CL 105  --  103 106  CO2 21*  --  28 27  GLUCOSE 234*  --  184* 135*  BUN 13  --  15 20  CREATININE 1.09 0.94 1.11 0.99  CALCIUM 8.4*  --  8.7* 8.5*  MG  --  1.8  --   --    Liver Function Tests:  Recent Labs Lab 11/28/16 1129  AST 60*  ALT 37  ALKPHOS 73  BILITOT 1.5*  PROT 5.9*  ALBUMIN 3.1*   Cardiac Enzymes:  Recent Labs Lab 11/28/16 2315 11/29/16 0232 11/29/16 0834  TROPONINI 0.17* 0.15* 0.09*   CBG:  Recent Labs Lab 11/29/16 0716 11/29/16 1248 11/29/16 1748 11/29/16 2023 11/30/16 0732  GLUCAP 128* 162* 114* 258* 146*   Urine analysis:    Component Value Date/Time   COLORURINE YELLOW 11/28/2016 [place] 11/28/2016 1457   LABSPEC 1.036 (H) 11/28/2016 1457   PHURINE 5.0 11/28/2016 1457   GLUCOSEU >=500 (A) 11/28/2016 1457   HGBUR NEGATIVE 11/28/2016 1457   BILIRUBINUR NEGATIVE 11/28/2016 1457   KETONESUR NEGATIVE 11/28/2016 1457   PROTEINUR NEGATIVE 11/28/2016 1457   UROBILINOGEN 0.2 06/24/2014 2112   NITRITE NEGATIVE 11/28/2016 1457   LEUKOCYTESUR NEGATIVE 11/28/2016 1457    Radiology Studies: Ct Angio Chest Pe W/cm &/or Wo Cm  Result Date:  11/28/2016 CLINICAL DATA:  Altered mentation, increasing dyspnea, cough and congestion for the past week, positive D-dimer. EXAM: CT ANGIOGRAPHY CHEST WITH CONTRAST TECHNIQUE: Multidetector CT imaging of the chest was performed using the standard protocol during bolus administration of intravenous contrast. Multiplanar CT image reconstructions and MIPs were obtained to evaluate the vascular anatomy. CONTRAST:  100 cc Isovue 370 IV COMPARISON:  11/28/2016 CXR FINDINGS: Cardiovascular: The study is of quality for the evaluation of pulmonary embolism. There are no filling defects in the central, lobar, and segmental pulmonary arteries to suggest acute pulmonary embolism. Great vessels are normal in course and caliber. Normal heart size. No significant pericardial fluid/thickening. Minimal aortic atherosclerosis without aneurysm. Mediastinum/Nodes: No discrete thyroid nodules. Unremarkable esophagus. No pathologically enlarged axillary, mediastinal or hilar lymph nodes. Lungs/Pleura: No pneumothorax. No pleural effusion. Small to moderate bilateral pleural effusions with bibasilar compressive and subsegmental atelectasis. Dependent atelectasis is noted along the posterior aspects of both upper lobes. Upper abdomen: Unremarkable. Musculoskeletal: No aggressive appearing focal osseous lesions. There is spondylosis of the included cervical and thoracic spine. No osseous appearing abnormality. Review of the MIP images confirms the above findings. IMPRESSION: 1. Small to moderate bilateral pleural effusions with adjacent atelectasis. 2. No acute pulmonary embolus. 3. Minimal aortic atherosclerosis without aneurysm. Aortic Atherosclerosis (ICD10-I70.0). Electronically Signed   By: Ashley Royalty M.D.   On: 11/28/2016 13:55   Dg Chest Port 1 View  Result Date: 11/28/2016 CLINICAL DATA:  Altered mental status since last night, increased shortness of breath, cough, and congestion for past week, history dementia EXAM: PORTABLE  CHEST 1 VIEW COMPARISON:  Portable exam 1145 hours compared to 06/22/2016 FINDINGS: Borderline enlargement of cardiac silhouette. Mediastinal contours and pulmonary vascularity normal. Low lung volumes with bibasilar atelectasis. Remaining lungs clear. No definite pleural effusion or pneumothorax. IMPRESSION: Decreased lung volumes with bibasilar atelectasis. Electronically Signed   By: Lavonia Dana M.D.   On: 11/28/2016 12:04   Scheduled Meds: . acidophilus  1 capsule Oral Daily  . buPROPion  100 mg Oral Daily  . cholecalciferol  1,000 Units Oral Daily  . donepezil  23 mg Oral QHS  . dorzolamide-timolol  1 drop Left Eye BID  . enoxaparin (LOVENOX) injection  40 mg Subcutaneous Q24H  . insulin aspart  0-5 Units Subcutaneous QHS  . insulin aspart  0-9 Units Subcutaneous TID WC  . insulin aspart  3 Units Subcutaneous TID WC  . lisinopril  5 mg Oral Daily  . memantine  28 mg Oral Daily  . mirabegron ER  50 mg Oral Daily  . simvastatin  20 mg Oral Daily  . sodium chloride flush  3 mL Intravenous Q12H   Continuous Infusions: . sodium chloride       LOS: 2 days    Time spent: 25 minutes   Faye Ramsay, MD [facility] Pager [phone]  If 7PM-7AM, please contact night-coverage [email or web address] Password [facility] 11/30/2016, 9:39 AM

## 2016-11-30 NOTE — Progress Notes (Signed)
Progress Note  Patient Name: Jose [name] Date of Encounter: 11/30/2016  Primary Cardiologist: Johnsie Cancel  Subjective   No dyspnea Wife does most of talking due to his dementia  Inpatient Medications    Scheduled Meds: . acidophilus  1 capsule Oral Daily  . buPROPion  100 mg Oral Daily  . cholecalciferol  1,000 Units Oral Daily  . donepezil  23 mg Oral QHS  . dorzolamide-timolol  1 drop Left Eye BID  . enoxaparin (LOVENOX) injection  40 mg Subcutaneous Q24H  . insulin aspart  0-5 Units Subcutaneous QHS  . insulin aspart  0-9 Units Subcutaneous TID WC  . insulin aspart  3 Units Subcutaneous TID WC  . lisinopril  5 mg Oral Daily  . memantine  28 mg Oral Daily  . mirabegron ER  50 mg Oral Daily  . simvastatin  20 mg Oral Daily  . sodium chloride flush  3 mL Intravenous Q12H   Continuous Infusions: . sodium chloride     PRN Meds: sodium chloride, acetaminophen, acetaminophen, haloperidol lactate, hydrocortisone, ondansetron (ZOFRAN) IV, sodium chloride flush, traMADol   Vital Signs    Vitals:   11/29/16 0520 11/29/16 0915 11/29/16 2030 11/30/16 0626  BP: 122/76 95/60 112/62 125/78  Pulse: 80 86 87 78  Resp: 18 16 18 18   Temp: (!) 97.4 F (36.3 C) 97.7 F (36.5 C) 98.1 F (36.7 C) 98.3 F (36.8 C)  TempSrc: Oral Oral Oral Oral  SpO2: 100% 92% 97% 93%  Weight: 179 lb 0.2 oz (81.2 kg)     Height:        Intake/Output Summary (Last 24 hours) at 11/30/16 0945 Last data filed at 11/30/16 1497  Gross per 24 hour  Intake              268 ml  Output             1050 ml  Net             -782 ml   Filed Weights   11/28/16 1131 11/28/16 2203 11/29/16 0520  Weight: 182 lb (82.6 kg) 179 lb 0.2 oz (81.2 kg) 179 lb 0.2 oz (81.2 kg)    Telemetry    NSR 11/30/2016  - Personally Reviewed  ECG    NSR nonspecific ST changes  - Personally Reviewed  Physical Exam  Elderly white male  GEN: No acute distress.   Neck: No JVD Cardiac: RRR, SEM murmurs, rubs, or gallops.    Respiratory: Clear to auscultation bilaterally. GI: Soft, nontender, non-distended  MS: No edema; No deformity. Neuro:  Nonfocal  Psych: Normal affect   Labs    Chemistry Recent Labs Lab 11/28/16 1129 11/28/16 2315 11/29/16 0232 11/30/16 0429  NA 134*  --  138 140  K 5.2*  --  3.3* 3.7  CL 105  --  103 106  CO2 21*  --  28 27  GLUCOSE 234*  --  184* 135*  BUN 13  --  15 20  CREATININE 1.09 0.94 1.11 0.99  CALCIUM 8.4*  --  8.7* 8.5*  PROT 5.9*  --   --   --   ALBUMIN 3.1*  --   --   --   AST 60*  --   --   --   ALT 37  --   --   --   ALKPHOS 73  --   --   --   BILITOT 1.5*  --   --   --  GFRNONAA >60 >60 59* >60  GFRAA >60 >60 >60 >60  ANIONGAP 8  --  7 7     Hematology Recent Labs Lab 11/28/16 1129 11/28/16 2315 11/30/16 0429  WBC 17.1* 11.2* 9.8  RBC 4.28 4.27 3.92*  HGB 14.0 13.8 12.7*  HCT 40.4 40.3 37.5*  MCV 94.4 94.4 95.7  MCH 32.7 32.3 32.4  MCHC 34.7 34.2 33.9  RDW 13.2 13.2 13.4  PLT 145* 155 128*    Cardiac Enzymes Recent Labs Lab 11/28/16 2315 11/29/16 0232 11/29/16 0834  TROPONINI 0.17* 0.15* 0.09*    Recent Labs Lab 11/28/16 1137  TROPIPOC 0.16*     BNP Recent Labs Lab 11/28/16 1129  BNP 994.3*     DDimer  Recent Labs Lab 11/28/16 1129  DDIMER 1.33*     Radiology    Ct Angio Chest Pe W/cm &/or Wo Cm  Result Date: 11/28/2016 CLINICAL DATA:  Altered mentation, increasing dyspnea, cough and congestion for the past week, positive D-dimer. EXAM: CT ANGIOGRAPHY CHEST WITH CONTRAST TECHNIQUE: Multidetector CT imaging of the chest was performed using the standard protocol during bolus administration of intravenous contrast. Multiplanar CT image reconstructions and MIPs were obtained to evaluate the vascular anatomy. CONTRAST:  100 cc Isovue 370 IV COMPARISON:  11/28/2016 CXR FINDINGS: Cardiovascular: The study is of quality for the evaluation of pulmonary embolism. There are no filling defects in the central, lobar, and  segmental pulmonary arteries to suggest acute pulmonary embolism. Great vessels are normal in course and caliber. Normal heart size. No significant pericardial fluid/thickening. Minimal aortic atherosclerosis without aneurysm. Mediastinum/Nodes: No discrete thyroid nodules. Unremarkable esophagus. No pathologically enlarged axillary, mediastinal or hilar lymph nodes. Lungs/Pleura: No pneumothorax. No pleural effusion. Small to moderate bilateral pleural effusions with bibasilar compressive and subsegmental atelectasis. Dependent atelectasis is noted along the posterior aspects of both upper lobes. Upper abdomen: Unremarkable. Musculoskeletal: No aggressive appearing focal osseous lesions. There is spondylosis of the included cervical and thoracic spine. No osseous appearing abnormality. Review of the MIP images confirms the above findings. IMPRESSION: 1. Small to moderate bilateral pleural effusions with adjacent atelectasis. 2. No acute pulmonary embolus. 3. Minimal aortic atherosclerosis without aneurysm. Aortic Atherosclerosis (ICD10-I70.0). Electronically Signed   By: Ashley Royalty M.D.   On: 11/28/2016 13:55   Dg Chest Port 1 View  Result Date: 11/28/2016 CLINICAL DATA:  Altered mental status since last night, increased shortness of breath, cough, and congestion for past week, history dementia EXAM: PORTABLE CHEST 1 VIEW COMPARISON:  Portable exam 1145 hours compared to 06/22/2016 FINDINGS: Borderline enlargement of cardiac silhouette. Mediastinal contours and pulmonary vascularity normal. Low lung volumes with bibasilar atelectasis. Remaining lungs clear. No definite pleural effusion or pneumothorax. IMPRESSION: Decreased lung volumes with bibasilar atelectasis. Electronically Signed   By: Lavonia Dana M.D.   On: 11/28/2016 12:04    Cardiac Studies   Echo EF 30-35%   Patient Profile     83 y.o. male with advanced dementia admitted with new onset CHF minimal elevation in troponin  With no trend and  no acute ECG changes Echo with EF 30-35% responded well to medical Rx  Assessment & Plan    1) Acute systolic CHF.  Will write for daily lasix EDP up on echo. Discussed low sodium diet, dauly weight And sliding scale lasix with wife continue beta blocker and ACE given advanced age and demential no invasive W/u planned Ok to d/c home will arrange outpatient f/u   Signed, Jenkins Rouge, MD  11/30/2016, 9:45  AM

## 2016-11-30 NOTE — Care Management CC44 (Signed)
Condition Code 44 Documentation Completed  Patient Details  Name: Jose [name] MRN: [id number] Date of Birth: 1930/11/03   Condition Code 44 given:  Yes Patient signature on Condition Code 44 notice:  Yes Documentation of 2 MD's agreement:  Yes Code 44 added to claim:  Yes    Marleen Moret, RN 11/30/2016, 3:17 PM

## 2016-11-30 NOTE — Progress Notes (Signed)
Report called to [place], Laurel Dimmer

## 2016-11-30 NOTE — Progress Notes (Signed)
Progress Note  Patient Name: Jose [name] Date of Encounter: 11/30/2016  Primary Cardiologist: Johnsie Cancel (new)  Subjective   dementia at baseline.   Inpatient Medications    Scheduled Meds: . acidophilus  1 capsule Oral Daily  . buPROPion  100 mg Oral Daily  . cholecalciferol  1,000 Units Oral Daily  . donepezil  23 mg Oral QHS  . dorzolamide-timolol  1 drop Left Eye BID  . enoxaparin (LOVENOX) injection  40 mg Subcutaneous Q24H  . insulin aspart  0-5 Units Subcutaneous QHS  . insulin aspart  0-9 Units Subcutaneous TID WC  . insulin aspart  3 Units Subcutaneous TID WC  . lisinopril  5 mg Oral Daily  . memantine  28 mg Oral Daily  . mirabegron ER  50 mg Oral Daily  . simvastatin  20 mg Oral Daily  . sodium chloride flush  3 mL Intravenous Q12H   Continuous Infusions: . sodium chloride     PRN Meds: sodium chloride, acetaminophen, acetaminophen, haloperidol lactate, hydrocortisone, ondansetron (ZOFRAN) IV, sodium chloride flush, traMADol   Vital Signs    Vitals:   11/29/16 0520 11/29/16 0915 11/29/16 2030 11/30/16 0626  BP: 122/76 95/60 112/62 125/78  Pulse: 80 86 87 78  Resp: 18 16 18 18   Temp: (!) 97.4 F (36.3 C) 97.7 F (36.5 C) 98.1 F (36.7 C) 98.3 F (36.8 C)  TempSrc: Oral Oral Oral Oral  SpO2: 100% 92% 97% 93%  Weight: 179 lb 0.2 oz (81.2 kg)     Height:        Intake/Output Summary (Last 24 hours) at 11/30/16 0921 Last data filed at 11/30/16 1540  Gross per 24 hour  Intake              268 ml  Output             1050 ml  Net             -782 ml   Filed Weights   11/28/16 1131 11/28/16 2203 11/29/16 0520  Weight: 182 lb (82.6 kg) 179 lb 0.2 oz (81.2 kg) 179 lb 0.2 oz (81.2 kg)    Telemetry    NSR with occasional PVCs - Personally Reviewed  ECG    NSR - Personally Reviewed  Physical Exam   GEN: No acute distress.   Neck: No JVD Cardiac: RRR, no murmurs, rubs, or gallops.  Respiratory: Clear to auscultation bilaterally. GI: Soft,  nontender, non-distended  MS: No edema; No deformity. Neuro:  dementia at baseline.  Psych: Normal affect   Labs    Chemistry Recent Labs Lab 11/28/16 1129 11/28/16 2315 11/29/16 0232 11/30/16 0429  NA 134*  --  138 140  K 5.2*  --  3.3* 3.7  CL 105  --  103 106  CO2 21*  --  28 27  GLUCOSE 234*  --  184* 135*  BUN 13  --  15 20  CREATININE 1.09 0.94 1.11 0.99  CALCIUM 8.4*  --  8.7* 8.5*  PROT 5.9*  --   --   --   ALBUMIN 3.1*  --   --   --   AST 60*  --   --   --   ALT 37  --   --   --   ALKPHOS 73  --   --   --   BILITOT 1.5*  --   --   --   GFRNONAA >60 >60 59* >60  GFRAA >60 >60 >  60 >60  ANIONGAP 8  --  7 7     Hematology Recent Labs Lab 11/28/16 1129 11/28/16 2315 11/30/16 0429  WBC 17.1* 11.2* 9.8  RBC 4.28 4.27 3.92*  HGB 14.0 13.8 12.7*  HCT 40.4 40.3 37.5*  MCV 94.4 94.4 95.7  MCH 32.7 32.3 32.4  MCHC 34.7 34.2 33.9  RDW 13.2 13.2 13.4  PLT 145* 155 128*    Cardiac Enzymes Recent Labs Lab 11/28/16 2315 11/29/16 0232 11/29/16 0834  TROPONINI 0.17* 0.15* 0.09*    Recent Labs Lab 11/28/16 1137  TROPIPOC 0.16*     BNP Recent Labs Lab 11/28/16 1129  BNP 994.3*     DDimer  Recent Labs Lab 11/28/16 1129  DDIMER 1.33*     Radiology    Ct Angio Chest Pe W/cm &/or Wo Cm  Result Date: 11/28/2016 CLINICAL DATA:  Altered mentation, increasing dyspnea, cough and congestion for the past week, positive D-dimer. EXAM: CT ANGIOGRAPHY CHEST WITH CONTRAST TECHNIQUE: Multidetector CT imaging of the chest was performed using the standard protocol during bolus administration of intravenous contrast. Multiplanar CT image reconstructions and MIPs were obtained to evaluate the vascular anatomy. CONTRAST:  100 cc Isovue 370 IV COMPARISON:  11/28/2016 CXR FINDINGS: Cardiovascular: The study is of quality for the evaluation of pulmonary embolism. There are no filling defects in the central, lobar, and segmental pulmonary arteries to suggest acute  pulmonary embolism. Great vessels are normal in course and caliber. Normal heart size. No significant pericardial fluid/thickening. Minimal aortic atherosclerosis without aneurysm. Mediastinum/Nodes: No discrete thyroid nodules. Unremarkable esophagus. No pathologically enlarged axillary, mediastinal or hilar lymph nodes. Lungs/Pleura: No pneumothorax. No pleural effusion. Small to moderate bilateral pleural effusions with bibasilar compressive and subsegmental atelectasis. Dependent atelectasis is noted along the posterior aspects of both upper lobes. Upper abdomen: Unremarkable. Musculoskeletal: No aggressive appearing focal osseous lesions. There is spondylosis of the included cervical and thoracic spine. No osseous appearing abnormality. Review of the MIP images confirms the above findings. IMPRESSION: 1. Small to moderate bilateral pleural effusions with adjacent atelectasis. 2. No acute pulmonary embolus. 3. Minimal aortic atherosclerosis without aneurysm. Aortic Atherosclerosis (ICD10-I70.0). Electronically Signed   By: Ashley Royalty M.D.   On: 11/28/2016 13:55   Dg Chest Port 1 View  Result Date: 11/28/2016 CLINICAL DATA:  Altered mental status since last night, increased shortness of breath, cough, and congestion for past week, history dementia EXAM: PORTABLE CHEST 1 VIEW COMPARISON:  Portable exam 1145 hours compared to 06/22/2016 FINDINGS: Borderline enlargement of cardiac silhouette. Mediastinal contours and pulmonary vascularity normal. Low lung volumes with bibasilar atelectasis. Remaining lungs clear. No definite pleural effusion or pneumothorax. IMPRESSION: Decreased lung volumes with bibasilar atelectasis. Electronically Signed   By: Lavonia Dana M.D.   On: 11/28/2016 12:04    Cardiac Studies   2D Echo 11/29/16 Study Conclusions  - Left ventricle: The cavity size was normal. There was moderate   concentric hypertrophy. Systolic function was moderately to   severely reduced. The estimated  ejection fraction was in the   range of 30% to 35%. Diffuse hypokinesis worse in the   inferoseptal myocardium. Features are consistent with a   pseudonormal left ventricular filling pattern, with concomitant   abnormal relaxation and increased filling pressure (grade 2   diastolic dysfunction). Doppler parameters are consistent with   high ventricular filling pressure. - Aortic valve: Transvalvular velocity was within the normal range.   There was no stenosis. There was mild regurgitation. Valve area   (Vmax):  1.72 cm^2. - Mitral valve: Mildly calcified annulus. Transvalvular velocity   was within the normal range. There was no evidence for stenosis.   There was no regurgitation. - Left atrium: The atrium was moderately dilated. - Right ventricle: The cavity size was normal. Wall thickness was   normal. Systolic function was normal. - Right atrium: The atrium was moderately to severely dilated. - Tricuspid valve: There was no regurgitation. - Pulmonary arteries: PA peak pressure: 12 mm Hg (S).   Patient Profile     Jose [name] is a 83 y.o. male with a hx of spinal stenosis, arthritis, dementia and type 2 diabetes, admitted for acute combined systolic and diastolic HF. Echo with reduced EF at 30-35% and G2DD.   Assessment & Plan    1. Acute Combined Systolic and Diastolic HF: EF reduced by echo at 30-35% with diffuse hypokinesis, worse in the inferoseptal myocardium, and G2DD. Admit BNP 994. He has received IV Lasix, 20 mg BID x 3 doses. No lasix ordered this am. Weight down from 182>>179. -1L out yesterday. Renal function, K and BP stable. He is on an ACE-I, lisinopril 5 mg. No BB currently ordered. HR is stable in the 70s-80s with occasional PVCs on tele. Systolic BP in the 009F. Will discuss with MD addition of low dose BB, if BP can tolerate.   2. Elevated Troponin: flat low level trend, 0.17>>0.15>.0.09, likely demand ischemia from acute CHF. EF is low by echo, however given age  and dementia, we will not conduct ischemic w/u.   3. T2DM: per primary. He is on Insulin.   4. Alzheimer's Dementia: management per primary. He is on Namenda.   Signed, Lyda Jester, PA-C  11/30/2016, 9:21 AM    See my separate progress note added coreg and daily oral lasix   Jenkins Rouge

## 2016-11-30 NOTE — Progress Notes (Addendum)
D/C Summary sent by [facility] CSW confirmed w/ katie patient able to return to [facility] Patient spouse inform of d/c. She request [facility] be called when she arrives to hospital. [facility] called for transport.    Kathrin Greathouse, Latanya Presser, MSW Clinical Social Worker 5E and Psychiatric Service Line [phone] 11/30/2016  1:57 PM

## 2016-11-30 NOTE — Care Management Obs Status (Signed)
[facility] [place] [facility] [place] [facility] NOTIFICATION   Patient Details  Name: Jose [name] MRN: [id number] Date of Birth: 1930-11-05   Medicare Observation Status Notification Given:  Yes    Purcell Mouton, RN 11/30/2016, 3:17 PM

## 2016-11-30 NOTE — Discharge Instructions (Signed)
Heart Failure °Heart failure means your heart has trouble pumping blood. This makes it hard for your body to work well. Heart failure is usually a long-term (chronic) condition. You must take good care of yourself and follow your doctor's treatment plan. °Follow these instructions at home: °· Take your heart medicine as told by your doctor. °? Do not stop taking medicine unless your doctor tells you to. °? Do not skip any dose of medicine. °? Refill your medicines before they run out. °? Take other medicines only as told by your doctor or pharmacist. °· Stay active if told by your doctor. The elderly and people with severe heart failure should talk with a doctor about physical activity. °· Eat heart-healthy foods. Choose foods that are without trans fat and are low in saturated fat, cholesterol, and salt (sodium). This includes fresh or frozen fruits and vegetables, fish, lean meats, fat-free or low-fat dairy foods, whole grains, and high-fiber foods. Lentils and dried peas and beans (legumes) are also good choices. °· Limit salt if told by your doctor. °· Cook in a healthy way. Roast, grill, broil, bake, poach, steam, or stir-fry foods. °· Limit fluids as told by your doctor. °· Weigh yourself every morning. Do this after you pee (urinate) and before you eat breakfast. Write down your weight to give to your doctor. °· Take your blood pressure and write it down if your doctor tells you to. °· Ask your doctor how to check your pulse. Check your pulse as told. °· Lose weight if told by your doctor. °· Stop smoking or chewing tobacco. Do not use gum or patches that help you quit without your doctor's approval. °· Schedule and go to doctor visits as told. °· Nonpregnant women should have no more than 1 drink a day. Men should have no more than 2 drinks a day. Talk to your doctor about drinking alcohol. °· Stop illegal drug use. °· Stay current with shots (immunizations). °· Manage your health conditions as told by your  doctor. °· Learn to manage your stress. °· Rest when you are tired. °· If it is really hot outside: °? Avoid intense activities. °? Use air conditioning or fans, or get in a cooler place. °? Avoid caffeine and alcohol. °? Wear loose-fitting, lightweight, and light-colored clothing. °· If it is really cold outside: °? Avoid intense activities. °? Layer your clothing. °? Wear mittens or gloves, a hat, and a scarf when going outside. °? Avoid alcohol. °· Learn about heart failure and get support as needed. °· Get help to maintain or improve your quality of life and your ability to care for yourself as needed. °Contact a doctor if: °· You gain weight quickly. °· You are more short of breath than usual. °· You cannot do your normal activities. °· You tire easily. °· You cough more than normal, especially with activity. °· You have any or more puffiness (swelling) in areas such as your hands, feet, ankles, or belly (abdomen). °· You cannot sleep because it is hard to breathe. °· You feel like your heart is beating fast (palpitations). °· You get dizzy or light-headed when you stand up. °Get help right away if: °· You have trouble breathing. °· There is a change in mental status, such as becoming less alert or not being able to focus. °· You have chest pain or discomfort. °· You faint. °This information is not intended to replace advice given to you by your health care provider. Make sure you   discuss any questions you have with your health care provider. °Document Released: 12/27/2007 Document Revised: 08/25/2015 Document Reviewed: 05/05/2012 °[employer] Interactive Patient Education © 2017 [employer] ° °

## 2016-12-04 LAB — CULTURE, BLOOD (ROUTINE X 2)
CULTURE: NO GROWTH
Culture: NO GROWTH
Special Requests: ADEQUATE
Special Requests: ADEQUATE

## 2016-12-04 LAB — BASIC METABOLIC PANEL
BUN: 15 (ref 4–21)
Creatinine: 1 (ref <=1.3)
Glucose: 144
Potassium: 4 (ref 3.4–5.3)
Sodium: 140 (ref 137–147)

## 2016-12-04 LAB — CBC AND DIFFERENTIAL
HEMATOCRIT: 40 — AB (ref 41–53)
Hemoglobin: 13.7 (ref 13.5–17.5)
PLATELETS: 165 (ref 150–399)
WBC: 9

## 2016-12-05 ENCOUNTER — Other Ambulatory Visit: Payer: Self-pay | Admitting: *Deleted

## 2016-12-06 ENCOUNTER — Non-Acute Institutional Stay (SKILLED_NURSING_FACILITY): Payer: Medicare Other

## 2016-12-06 ENCOUNTER — Non-Acute Institutional Stay (SKILLED_NURSING_FACILITY): Payer: Medicare Other | Admitting: Internal Medicine

## 2016-12-06 ENCOUNTER — Encounter: Payer: Self-pay | Admitting: Internal Medicine

## 2016-12-06 DIAGNOSIS — Z0000 Encounter for general adult medical examination without abnormal findings: Secondary | ICD-10-CM

## 2016-12-06 DIAGNOSIS — M5441 Lumbago with sciatica, right side: Secondary | ICD-10-CM

## 2016-12-06 DIAGNOSIS — M5442 Lumbago with sciatica, left side: Secondary | ICD-10-CM

## 2016-12-06 DIAGNOSIS — G309 Alzheimer's disease, unspecified: Secondary | ICD-10-CM

## 2016-12-06 DIAGNOSIS — I5042 Chronic combined systolic (congestive) and diastolic (congestive) heart failure: Secondary | ICD-10-CM | POA: Diagnosis not present

## 2016-12-06 DIAGNOSIS — E119 Type 2 diabetes mellitus without complications: Secondary | ICD-10-CM | POA: Diagnosis not present

## 2016-12-06 DIAGNOSIS — R531 Weakness: Secondary | ICD-10-CM

## 2016-12-06 DIAGNOSIS — J9811 Atelectasis: Secondary | ICD-10-CM

## 2016-12-06 DIAGNOSIS — F0280 Dementia in other diseases classified elsewhere without behavioral disturbance: Secondary | ICD-10-CM

## 2016-12-06 DIAGNOSIS — E1169 Type 2 diabetes mellitus with other specified complication: Secondary | ICD-10-CM

## 2016-12-06 DIAGNOSIS — G8929 Other chronic pain: Secondary | ICD-10-CM

## 2016-12-06 DIAGNOSIS — R3981 Functional urinary incontinence: Secondary | ICD-10-CM

## 2016-12-06 DIAGNOSIS — F339 Major depressive disorder, recurrent, unspecified: Secondary | ICD-10-CM | POA: Diagnosis not present

## 2016-12-06 DIAGNOSIS — E785 Hyperlipidemia, unspecified: Secondary | ICD-10-CM

## 2016-12-06 NOTE — Progress Notes (Signed)
Provider:  [name] MD  Location:  [facility] Room Number: 26 Place of Service:  SNF (31)  PCP: [name], MD Patient Care Team: [name], MD as PCP - General (Internal Medicine) Mast, Man X, NP as Nurse Practitioner (Internal Medicine)  Extended Emergency Contact Information Primary Emergency Contact: [name] Address: [place], [place] Phone: [phone] Mobile Phone: [phone] Relation: Spouse Secondary Emergency Contact: [name] [place] Phone: [phone] Work Phone: [phone] Relation: Daughter  Code Status: Full Code  Goals of Care: Advanced Directive information Advanced Directives 11/28/2016  Does Patient Have a Medical Advance Directive? Yes  Type of Paramedic of [place];Living will  Does patient want to make changes to medical advance directive? No - Patient declined  Copy of [place] in Chart? Yes  Would patient like information on creating a medical advance directive? No - Patient declined  Pre-existing out of facility DNR order (yellow form or pink MOST form) -      Chief Complaint  Patient presents with  . Readmit To SNF    Readmission Visit     HPI: Patient is a 83 y.o. male seen today for re-admission visit. He was in the hospital from 11/28/16-11/30/16 with acute respiratory failure from acute on chronic systolic and diastolic CHF with EF 93-71%. He was seen by cardiology and placed on b blocker, ACEI and diuretic. He responded well to iv diuresis. He had troponin leak and this was thought to be from demand ischemia. His lytes were corrected. He was residing in SNF prior to this hospitalization. He has medical history of dementia, diabetes, OA and lumbar stenosis among others. He is seen in his room today with his wife at bedside.   Past Medical History:  Diagnosis Date  .  Abnormality of gait   . Backache, unspecified   . Colon polyps    adenomatous  . Dementia 11/12/2012  . Diverticulosis   . Essential and other specified forms of tremor   . Hemorrhoids   . History of bilateral hip replacements 11/12/2012  . Memory loss   . 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 Pain in joint, pelvic region and thigh   . Skin cancer of scalp   . Spinal stenosis, lumbar region, without neurogenic claudication   . Unspecified hereditary and idiopathic peripheral neuropathy    Past Surgical History:  Procedure Laterality Date  . CATARACT EXTRACTION, BILATERAL  2012  . RETINAL LASER PROCEDURE  2012   retinal wrinkle  . SKIN CANCER EXCISION    . TOTAL HIP ARTHROPLASTY Left 2000  . TOTAL HIP ARTHROPLASTY (aka REPLACEMENT) Right 2011    reports that he has quit smoking. His smoking use included Cigarettes. He has never used smokeless tobacco. He reports that he drinks about 8.4 oz of alcohol per week . He reports that he does not use drugs. Social History   Social History  . Marital status: Married    Spouse name: [name]  . Number of children: 2  . Years of education: lawyer   Occupational History  . Retired [employer]    retired   Social History Main Topics  . Smoking status: Former Smoker    Types: Cigarettes  . Smokeless tobacco: Never Used  . Alcohol use 8.4 oz/week    14 Glasses of wine  per week     Comment: occas, one glass of wine before dinner,sometimes 1/2 glass  . Drug use: No  . Sexual activity: No   Other Topics Concern  . Not on file   Social History Narrative   Patient is right handed and resides in home with wife    Functional Status Survey:    Family History  Problem Relation Age of Onset  . Heart failure Mother     Health Maintenance  Topic Date Due  . INFLUENZA VACCINE  10/31/2016  . PNA vac Low Risk Adult (1 of 2 - PCV13) 04/02/2017 (Originally 06/05/1997)  . FOOT EXAM  09/28/2017  (Originally 06/06/1940)  . OPHTHALMOLOGY EXAM  09/28/2017 (Originally 06/06/1940)  . TETANUS/TDAP  09/29/2026 (Originally 06/06/1949)  . HEMOGLOBIN A1C  03/27/2017    Allergies  Allergen Reactions  . Axona [Bacid]     Unknown per MAR  . Gabapentin     Hallucinations    Outpatient Encounter Prescriptions as of 12/06/2016  Medication Sig  . acetaminophen (TYLENOL) 325 MG tablet Take 650 mg by mouth every 4 (four) hours as needed for fever.  Marland Kitchen acetaminophen (TYLENOL) 500 MG tablet Take 500 mg by mouth every 8 (eight) hours as needed (pain).   Marland Kitchen buPROPion (WELLBUTRIN SR) 100 MG 12 hr tablet Take 100 mg by mouth daily.  . carvedilol (COREG) 3.125 MG tablet Take 1 tablet (3.125 mg total) by mouth 2 (two) times daily with a meal.  . Cholecalciferol (VITAMIN D) 1000 UNITS capsule Take 1,000 Units by mouth daily.    Marland Kitchen dextromethorphan (DELSYM) 30 MG/5ML liquid Take 60 mg by mouth every 12 (twelve) hours as needed for cough. Take 10 mL  . donepezil (ARICEPT) 23 MG TABS tablet Take 1 tablet (23 mg total) by mouth daily.  . dorzolamide-timolol (COSOPT) 22.3-6.8 MG/ML ophthalmic solution Place 1 drop into the left eye 2 (two) times daily.   . Emollient (CERAVE) CREA Apply 1 application topically daily. Apply to arms, legs and trunk  . Flaxseed, Linseed, (FLAXSEED OIL) 1000 MG CAPS Take 1,000 mg by mouth daily.    . folic acid (FOLVITE) 517 MCG tablet Take 800 mcg by mouth every evening.   . furosemide (LASIX) 20 MG tablet Take 1 tablet (20 mg total) by mouth daily.  . hydrocortisone (PROCTOSOL HC) 2.5 % rectal cream Place 1 application rectally at bedtime as needed for hemorrhoids or anal itching.  . hydrocortisone cream 1 % Apply 1 application topically 2 (two) times daily.  Marland Kitchen lisinopril (PRINIVIL,ZESTRIL) 5 MG tablet Take 1 tablet (5 mg total) by mouth daily.  Marland Kitchen loperamide (IMODIUM A-D) 2 MG tablet Take 2 mg by mouth 4 (four) times daily as needed for diarrhea or loose stools.   . memantine (NAMENDA XR)  28 MG CP24 24 hr capsule Take 1 capsule (28 mg total) by mouth daily.  . metFORMIN (GLUCOPHAGE) 500 MG tablet Take 500 mg by mouth daily.   . mirabegron ER (MYRBETRIQ) 50 MG TB24 tablet Take 50 mg by mouth daily.  . Multiple Vitamin (MULTIVITAMIN) tablet Take 1 tablet by mouth daily.    Marland Kitchen nystatin (NYSTATIN) powder Apply topically 2 (two) times daily. Apply to right and left buttocks.  . potassium chloride (KLOR-CON) 10 MEQ CR tablet Take 10 mEq by mouth 2 (two) times a week. On mondays and fridays  . Probiotic Product (ALIGN) 4 MG CAPS Take 4 mg by mouth daily.  . simvastatin (ZOCOR) 20 MG tablet Take 20 mg by mouth  daily.  . white petrolatum ointment Apply 1 application topically daily.  . [DISCONTINUED] Emollient (CERAVE EX) Apply 1 application topically daily.   No facility-administered encounter medications on file as of 12/06/2016.     Review of Systems  Constitutional: Positive for fatigue. Negative for appetite change, chills and fever.  HENT: Negative for congestion, mouth sores, rhinorrhea, sore throat and trouble swallowing.   Eyes:       Wears glasses.  Respiratory: Positive for cough and shortness of breath. Negative for chest tightness and wheezing.        Shortness of breath at rest and with minimal exertion. Wet cough with phlegm. Unable to the bring the phlegm up.   Cardiovascular: Negative for chest pain, palpitations and leg swelling.  Gastrointestinal: Negative for abdominal pain, blood in stool, constipation, diarrhea, nausea and vomiting.       Last bowel movement was yesterday. Bowel movements every day per patient.   Genitourinary: Negative for hematuria.       Has urinary incontience  Musculoskeletal: Positive for back pain and gait problem.       On wheelchair, has chronic back pain  Neurological: Positive for dizziness. Negative for syncope and numbness.       With minimal exertion.   Psychiatric/Behavioral: Positive for confusion. Negative for behavioral  problems.    Vitals:   12/06/16 1156  BP: 120/82  Pulse: 84  Resp: 16  Temp: 98.7 F (37.1 C)  TempSrc: Oral  SpO2: 95%  Weight: 181 lb 4.8 oz (82.2 kg)  Height: 6\' 1"  (1.854 m)   Body mass index is 23.92 kg/m. Physical Exam  Constitutional: He appears well-developed and well-nourished. No distress.  HENT:  Head: Normocephalic and atraumatic.  Mouth/Throat: Oropharynx is clear and moist.  Eyes: Pupils are equal, round, and reactive to light. Conjunctivae and EOM are normal.  Neck: Normal range of motion. Neck supple. No thyromegaly present.  Cardiovascular: Normal rate and regular rhythm.   Pulmonary/Chest: Effort normal. No respiratory distress. He has no wheezes. He has rales.  Decreased air entry to lung bases  Abdominal: Soft. Bowel sounds are normal. There is no tenderness. There is no guarding.  Musculoskeletal: He exhibits edema.  Able to move all 4 extremities, on wheelchair, arthritis changes to his fingers  Lymphadenopathy:    He has no cervical adenopathy.  Neurological: He is alert.  Oriented to self and place but not to time  Skin: Skin is warm and dry. No rash noted. He is not diaphoretic. No erythema.  Seborrhea +, keratotic spots to his sclap  Psychiatric: He has a normal mood and affect. His behavior is normal.    Labs reviewed: Basic Metabolic Panel:  Recent Labs  11/28/16 1129 11/28/16 2315 11/29/16 0232 11/30/16 0429 12/04/16  NA 134*  --  138 140 140  K 5.2*  --  3.3* 3.7 4.0  CL 105  --  103 106  --   CO2 21*  --  28 27  --   GLUCOSE 234*  --  184* 135*  --   BUN 13  --  15 20 15   CREATININE 1.09 0.94 1.11 0.99 1.0  CALCIUM 8.4*  --  8.7* 8.5*  --   MG  --  1.8  --   --   --    Liver Function Tests:  Recent Labs  03/16/16 1201 06/23/16 0430 06/26/16 09/25/16 11/28/16 1129  AST 22 18 17 19  60*  ALT 27 23 20 27  37  ALKPHOS  77 66 59 75 73  BILITOT 0.9 0.7  --   --  1.5*  PROT 7.0 5.8*  --   --  5.9*  ALBUMIN 4.0 3.2*  --   --   3.1*    Recent Labs  03/16/16 1201  LIPASE 25   No results for input(s): AMMONIA in the last 8760 hours. CBC:  Recent Labs  06/22/16 1057  11/28/16 1129 11/28/16 2315 11/30/16 0429 12/04/16  WBC 18.7*  < > 17.1* 11.2* 9.8 9.0  NEUTROABS 16.0*  --  14.9*  --   --   --   HGB 15.0  < > 14.0 13.8 12.7* 13.7  HCT 43.9  < > 40.4 40.3 37.5* 40*  MCV 96.9  < > 94.4 94.4 95.7  --   PLT 130*  < > 145* 155 128* 165  < > = values in this interval not displayed. Cardiac Enzymes:  Recent Labs  11/28/16 2315 11/29/16 0232 11/29/16 0834  TROPONINI 0.17* 0.15* 0.09*   BNP: Invalid input(s): POCBNP Lab Results  Component Value Date   HGBA1C 6.7 09/25/2016   Lab Results  Component Value Date   TSH 2.40 06/26/2016   No results found for: VITAMINB12 No results found for: FOLATE No results found for: IRON, TIBC, FERRITIN  Imaging and Procedures obtained prior to SNF admission: Ct Angio Chest Pe W/cm &/or Wo Cm  Result Date: 11/28/2016 CLINICAL DATA:  Altered mentation, increasing dyspnea, cough and congestion for the past week, positive D-dimer. EXAM: CT ANGIOGRAPHY CHEST WITH CONTRAST TECHNIQUE: Multidetector CT imaging of the chest was performed using the standard protocol during bolus administration of intravenous contrast. Multiplanar CT image reconstructions and MIPs were obtained to evaluate the vascular anatomy. CONTRAST:  100 cc Isovue 370 IV COMPARISON:  11/28/2016 CXR FINDINGS: Cardiovascular: The study is of quality for the evaluation of pulmonary embolism. There are no filling defects in the central, lobar, and segmental pulmonary arteries to suggest acute pulmonary embolism. Great vessels are normal in course and caliber. Normal heart size. No significant pericardial fluid/thickening. Minimal aortic atherosclerosis without aneurysm. Mediastinum/Nodes: No discrete thyroid nodules. Unremarkable esophagus. No pathologically enlarged axillary, mediastinal or hilar lymph nodes.  Lungs/Pleura: No pneumothorax. No pleural effusion. Small to moderate bilateral pleural effusions with bibasilar compressive and subsegmental atelectasis. Dependent atelectasis is noted along the posterior aspects of both upper lobes. Upper abdomen: Unremarkable. Musculoskeletal: No aggressive appearing focal osseous lesions. There is spondylosis of the included cervical and thoracic spine. No osseous appearing abnormality. Review of the MIP images confirms the above findings. IMPRESSION: 1. Small to moderate bilateral pleural effusions with adjacent atelectasis. 2. No acute pulmonary embolus. 3. Minimal aortic atherosclerosis without aneurysm. Aortic Atherosclerosis (ICD10-I70.0). Electronically Signed   By: Ashley Royalty M.D.   On: 11/28/2016 13:55   Dg Chest Port 1 View  Result Date: 11/28/2016 CLINICAL DATA:  Altered mental status since last night, increased shortness of breath, cough, and congestion for past week, history dementia EXAM: PORTABLE CHEST 1 VIEW COMPARISON:  Portable exam 1145 hours compared to 06/22/2016 FINDINGS: Borderline enlargement of cardiac silhouette. Mediastinal contours and pulmonary vascularity normal. Low lung volumes with bibasilar atelectasis. Remaining lungs clear. No definite pleural effusion or pneumothorax. IMPRESSION: Decreased lung volumes with bibasilar atelectasis. Electronically Signed   By: Lavonia Dana M.D.   On: 11/28/2016 12:04    Assessment/Plan  Generalized weakness With deconditioning. Will have him work with physical therapy and occupational therapy team to help with gait training and muscle strengthening exercises.fall  precautions. Skin care. Encourage to be out of bed.   Chronic CHF With recent acute exacerbation. Continue lisinopril 5 mg daily and carvedilol 3.125 mg bid. Continue furosemide 20 mg daily. Monitor BP and weight. Check BMP  Atelectasis With rales and decreased air entry on exam. Add incentive spirometer and encourage use. Continue cough  expectorant.  Type 2 diabetes mellitus Lab Results  Component Value Date   HGBA1C 6.7 09/25/2016   a1c is suggestive of controlled diabetes. Continue metformin 500 mg daily and monitor  Dementia without behavioral disturbance Supportive care, donepezil 23 mg daily and memantine.  Goals of care counselling Reviewed care plan with his wife who is his HCPOA. He is currently full code. Reviewed about goals of care for him and anticipated outcome with his current health and medical co-morbidities. Wife would talk about this with her kids and decide further. Will need a care plan meeting.   Chronic back pain Continue acetaminophen 500 mg q8h prn pain  UI Continue mirabegron 50 mg daily and monitor  Hyperlipidemia Continue simvastatin  Chronic depression Stable mood overall, continue bupropion 100 mg daily  Family/ staff Communication: reviewed care plan with patient, his wife and charge nurse.    Labs/tests ordered: cbc, bmp  [name], MD Internal Medicine [facility] [address] [place],  [address] Cell Phone (Monday-Friday 8 am - 5 pm): [phone] On Call: [phone] and follow prompts after 5 pm and on weekends Office Phone: [phone] Office Fax: [phone]

## 2016-12-07 NOTE — Patient Instructions (Signed)
Jose [name] , Thank you for taking time to come for your Medicare Wellness Visit. I appreciate your ongoing commitment to your health goals. Please review the following plan we discussed and let me know if I can assist you in the future.   Screening recommendations/referrals: Colonoscopy excluded, pt over age 81 Recommended yearly ophthalmology/optometry visit for glaucoma screening and checkup Recommended yearly dental visit for hygiene and checkup  Vaccinations: Influenza vaccine due Pneumococcal vaccine up to date Tdap vaccine up to date Due 12/20/1920 Shingles vaccine not in records  Advanced directives: Need a copy for chart  Conditions/risks identified: none  Next appointment: Dr. Bubba Camp makes rounds  Preventive Care 83 Years and Older, Male Preventive care refers to lifestyle choices and visits with your health care provider that can promote health and wellness. What does preventive care include?  A yearly physical exam. This is also called an annual well check.  Dental exams once or twice a year.  Routine eye exams. Ask your health care provider how often you should have your eyes checked.  Personal lifestyle choices, including:  Daily care of your teeth and gums.  Regular physical activity.  Eating a healthy diet.  Avoiding tobacco and drug use.  Limiting alcohol use.  Practicing safe sex.  Taking low doses of aspirin every day.  Taking vitamin and mineral supplements as recommended by your health care provider. What happens during an annual well check? The services and screenings done by your health care provider during your annual well check will depend on your age, overall health, lifestyle risk factors, and family history of disease. Counseling  Your health care provider may ask you questions about your:  Alcohol use.  Tobacco use.  Drug use.  Emotional well-being.  Home and relationship well-being.  Sexual activity.  Eating habits.  History  of falls.  Memory and ability to understand (cognition).  Work and work Statistician. Screening  You may have the following tests or measurements:  Height, weight, and BMI.  Blood pressure.  Lipid and cholesterol levels. These may be checked every 5 years, or more frequently if you are over 17 years old.  Skin check.  Lung cancer screening. You may have this screening every year starting at age 14 if you have a 30-pack-year history of smoking and currently smoke or have quit within the past 15 years.  Fecal occult blood test (FOBT) of the stool. You may have this test every year starting at age 72.  Flexible sigmoidoscopy or colonoscopy. You may have a sigmoidoscopy every 5 years or a colonoscopy every 10 years starting at age 69.  Prostate cancer screening. Recommendations will vary depending on your family history and other risks.  Hepatitis C blood test.  Hepatitis B blood test.  Sexually transmitted disease (STD) testing.  Diabetes screening. This is done by checking your blood sugar (glucose) after you have not eaten for a while (fasting). You may have this done every 1-3 years.  Abdominal aortic aneurysm (AAA) screening. You may need this if you are a current or former smoker.  Osteoporosis. You may be screened starting at age 56 if you are at high risk. Talk with your health care provider about your test results, treatment options, and if necessary, the need for more tests. Vaccines  Your health care provider may recommend certain vaccines, such as:  Influenza vaccine. This is recommended every year.  Tetanus, diphtheria, and acellular pertussis (Tdap, Td) vaccine. You may need a Td booster every 10 years.  Zoster vaccine. You may need this after age 34.  Pneumococcal 13-valent conjugate (PCV13) vaccine. One dose is recommended after age 33.  Pneumococcal polysaccharide (PPSV23) vaccine. One dose is recommended after age 34. Talk to your health care provider  about which screenings and vaccines you need and how often you need them. This information is not intended to replace advice given to you by your health care provider. Make sure you discuss any questions you have with your health care provider. Document Released: 04/15/2015 Document Revised: 12/07/2015 Document Reviewed: 01/18/2015 Elsevier Interactive Patient Education  2017 [place] Prevention in the Home Falls can cause injuries. They can happen to people of all ages. There are many things you can do to make your home safe and to help prevent falls. What can I do on the outside of my home?  Regularly fix the edges of walkways and driveways and fix any cracks.  Remove anything that might make you trip as you walk through a door, such as a raised step or threshold.  Trim any bushes or trees on the path to your home.  Use bright outdoor lighting.  Clear any walking paths of anything that might make someone trip, such as rocks or tools.  Regularly check to see if handrails are loose or broken. Make sure that both sides of any steps have handrails.  Any raised decks and porches should have guardrails on the edges.  Have any leaves, snow, or ice cleared regularly.  Use sand or salt on walking paths during winter.  Clean up any spills in your garage right away. This includes oil or grease spills. What can I do in the bathroom?  Use night lights.  Install grab bars by the toilet and in the tub and shower. Do not use towel bars as grab bars.  Use non-skid mats or decals in the tub or shower.  If you need to sit down in the shower, use a plastic, non-slip stool.  Keep the floor dry. Clean up any water that spills on the floor as soon as it happens.  Remove soap buildup in the tub or shower regularly.  Attach bath mats securely with double-sided non-slip rug tape.  Do not have throw rugs and other things on the floor that can make you trip. What can I do in the  bedroom?  Use night lights.  Make sure that you have a light by your bed that is easy to reach.  Do not use any sheets or blankets that are too big for your bed. They should not hang down onto the floor.  Have a firm chair that has side arms. You can use this for support while you get dressed.  Do not have throw rugs and other things on the floor that can make you trip. What can I do in the kitchen?  Clean up any spills right away.  Avoid walking on wet floors.  Keep items that you use a lot in easy-to-reach places.  If you need to reach something above you, use a strong step stool that has a grab bar.  Keep electrical cords out of the way.  Do not use floor polish or wax that makes floors slippery. If you must use wax, use non-skid floor wax.  Do not have throw rugs and other things on the floor that can make you trip. What can I do with my stairs?  Do not leave any items on the stairs.  Make sure that there are  handrails on both sides of the stairs and use them. Fix handrails that are broken or loose. Make sure that handrails are as long as the stairways.  Check any carpeting to make sure that it is firmly attached to the stairs. Fix any carpet that is loose or worn.  Avoid having throw rugs at the top or bottom of the stairs. If you do have throw rugs, attach them to the floor with carpet tape.  Make sure that you have a light switch at the top of the stairs and the bottom of the stairs. If you do not have them, ask someone to add them for you. What else can I do to help prevent falls?  Wear shoes that:  Do not have high heels.  Have rubber bottoms.  Are comfortable and fit you well.  Are closed at the toe. Do not wear sandals.  If you use a stepladder:  Make sure that it is fully opened. Do not climb a closed stepladder.  Make sure that both sides of the stepladder are locked into place.  Ask someone to hold it for you, if possible.  Clearly mark and make  sure that you can see:  Any grab bars or handrails.  First and last steps.  Where the edge of each step is.  Use tools that help you move around (mobility aids) if they are needed. These include:  Canes.  Walkers.  Scooters.  Crutches.  Turn on the lights when you go into a dark area. Replace any light bulbs as soon as they burn out.  Set up your furniture so you have a clear path. Avoid moving your furniture around.  If any of your floors are uneven, fix them.  If there are any pets around you, be aware of where they are.  Review your medicines with your doctor. Some medicines can make you feel dizzy. This can increase your chance of falling. Ask your doctor what other things that you can do to help prevent falls. This information is not intended to replace advice given to you by your health care provider. Make sure you discuss any questions you have with your health care provider. Document Released: 01/13/2009 Document Revised: 08/25/2015 Document Reviewed: 04/23/2014 Elsevier Interactive Patient Education  2017 [employer].

## 2016-12-07 NOTE — Progress Notes (Signed)
Subjective:   Jose [name] is a 83 y.o. male who presents for Medicare Annual/Subsequent preventive examination at [facility]  Last AWV-09/20/2015    Objective:    Vitals: BP (!) 160/70 (BP Location: Right Arm, Patient Position: Sitting)   Pulse 86   Temp (!) 97.4 F (36.3 C) (Oral)   Ht 5\' 9"  (1.753 m)   Wt 179 lb (81.2 kg)   SpO2 95%   BMI 26.43 kg/m   Body mass index is 26.43 kg/m.  Tobacco History  Smoking Status  . Former Smoker  . Types: Cigarettes  Smokeless Tobacco  . Never Used     Counseling given: Not Answered   Past Medical History:  Diagnosis Date  . Abnormality of gait   . Backache, unspecified   . Colon polyps    adenomatous  . Dementia 11/12/2012  . Diverticulosis   . Essential and other specified forms of tremor   . Hemorrhoids   . History of bilateral hip replacements 11/12/2012  . Memory loss   . 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 Pain in joint, pelvic region and thigh   . Skin cancer of scalp   . Spinal stenosis, lumbar region, without neurogenic claudication   . Unspecified hereditary and idiopathic peripheral neuropathy    Past Surgical History:  Procedure Laterality Date  . CATARACT EXTRACTION, BILATERAL  2012  . RETINAL LASER PROCEDURE  2012   retinal wrinkle  . SKIN CANCER EXCISION    . TOTAL HIP ARTHROPLASTY Left 2000  . TOTAL HIP ARTHROPLASTY (aka REPLACEMENT) Right 2011   Family History  Problem Relation Age of Onset  . Heart failure Mother    History  Sexual Activity  . Sexual activity: No    Outpatient Encounter Prescriptions as of 12/06/2016  Medication Sig  . acetaminophen (TYLENOL) 325 MG tablet Take 650 mg by mouth every 4 (four) hours as needed for fever.  Marland Kitchen acetaminophen (TYLENOL) 500 MG tablet Take 500 mg by mouth every 8 (eight) hours as needed (pain).   Marland Kitchen buPROPion (WELLBUTRIN SR) 100 MG 12 hr tablet Take 100 mg by mouth daily.  . carvedilol  (COREG) 3.125 MG tablet Take 1 tablet (3.125 mg total) by mouth 2 (two) times daily with a meal.  . dextromethorphan (DELSYM) 30 MG/5ML liquid Take 60 mg by mouth every 12 (twelve) hours as needed for cough. Take 10 mL  . donepezil (ARICEPT) 23 MG TABS tablet Take 1 tablet (23 mg total) by mouth daily.  . dorzolamide-timolol (COSOPT) 22.3-6.8 MG/ML ophthalmic solution Place 1 drop into the left eye 2 (two) times daily.   . Emollient (CERAVE) CREA Apply 1 application topically daily. Apply to arms, legs and trunk  . Flaxseed, Linseed, (FLAXSEED OIL) 1000 MG CAPS Take 1,000 mg by mouth daily.    . folic acid (FOLVITE) 086 MCG tablet Take 800 mcg by mouth every evening.   . furosemide (LASIX) 20 MG tablet Take 1 tablet (20 mg total) by mouth daily.  . hydrocortisone (PROCTOSOL HC) 2.5 % rectal cream Place 1 application rectally at bedtime as needed for hemorrhoids or anal itching.  . hydrocortisone cream 1 % Apply 1 application topically 2 (two) times daily.  Marland Kitchen lisinopril (PRINIVIL,ZESTRIL) 5 MG tablet Take 1 tablet (5 mg total) by mouth daily.  Marland Kitchen loperamide (IMODIUM A-D) 2 MG tablet Take 2 mg by mouth 4 (four) times daily as needed for diarrhea or loose stools.   Marland Kitchen  memantine (NAMENDA XR) 28 MG CP24 24 hr capsule Take 1 capsule (28 mg total) by mouth daily.  . metFORMIN (GLUCOPHAGE) 500 MG tablet Take 500 mg by mouth daily.   . mirabegron ER (MYRBETRIQ) 50 MG TB24 tablet Take 50 mg by mouth daily.  . Multiple Vitamin (MULTIVITAMIN) tablet Take 1 tablet by mouth daily.    Marland Kitchen nystatin (NYSTATIN) powder Apply topically 2 (two) times daily. Apply to right and left buttocks.  . potassium chloride (KLOR-CON) 10 MEQ CR tablet Take 10 mEq by mouth 2 (two) times a week. On mondays and fridays  . Probiotic Product (ALIGN) 4 MG CAPS Take 4 mg by mouth daily.  . simvastatin (ZOCOR) 20 MG tablet Take 20 mg by mouth daily.  . white petrolatum ointment Apply 1 application topically daily.  . Cholecalciferol  (VITAMIN D) 1000 UNITS capsule Take 1,000 Units by mouth daily.     No facility-administered encounter medications on file as of 12/06/2016.     Activities of Daily Living In your present state of health, do you have any difficulty performing the following activities: 12/07/2016 11/28/2016  Hearing? N N  Vision? N N  Difficulty concentrating or making decisions? Y Y  Comment - -  Walking or climbing stairs? Y Y  Dressing or bathing? Y Y  Doing errands, shopping? Tempie Donning  Preparing Food and eating ? Y -  Using the Toilet? Y -  In the past six months, have you accidently leaked urine? Y -  Do you have problems with loss of bowel control? Y -  Managing your Medications? Y -  Managing your Finances? Y -  Housekeeping or managing your Housekeeping? Y -  Some recent data might be hidden    Patient Care Team: [name], MD as PCP - General (Internal Medicine) Mast, Man X, NP as Nurse Practitioner (Internal Medicine)   Assessment:     Exercise Activities and Dietary recommendations Current Exercise Habits: The patient does not participate in regular exercise at present, Exercise limited by: orthopedic condition(s)  Goals    None     Fall Risk Fall Risk  12/07/2016 01/19/2016 07/21/2015  Falls in the past year? No Yes Yes  Number falls in past yr: - 1 1  Injury with Fall? - No No  Risk for fall due to : - Impaired balance/gait -  Follow up - Falls prevention discussed Falls prevention discussed   Depression Screen PHQ 2/9 Scores 12/07/2016  PHQ - 2 Score 0    Cognitive Function MMSE - Mini Mental State Exam 12/07/2016 06/28/2016 01/19/2016 07/21/2015 01/20/2015  Orientation to time 0 0 1 0 0  Orientation to Place 3 4 3 3 3   Registration 3 3 3 3 3   Attention/ Calculation 3 2 5 5 5   Recall 0 0 0 0 0  Language- name 2 objects 2 2 2 2 2   Language- repeat 1 1 1 1 1   Language- follow 3 step command 3 3 3 3 3   Language- read & follow direction 1 1 1 1 1   Write a sentence 1 1 1 1 1     Copy design 1 1 1 1 1   Total score 18 18 21 20 20         Immunization History  Administered Date(s) Administered  . PPD Test 06/24/2016   Screening Tests Health Maintenance  Topic Date Due  . INFLUENZA VACCINE  10/31/2016  . PNA vac Low Risk Adult (1 of 2 - PCV13) 04/02/2017 (Originally 06/05/1997)  .  FOOT EXAM  09/28/2017 (Originally 06/06/1940)  . OPHTHALMOLOGY EXAM  09/28/2017 (Originally 06/06/1940)  . TETANUS/TDAP  09/29/2026 (Originally 06/06/1949)  . HEMOGLOBIN A1C  03/27/2017      Plan:    I have personally reviewed and addressed the Medicare Annual Wellness questionnaire and have noted the following in the patient's chart:  A. Medical and social history B. Use of alcohol, tobacco or illicit drugs  C. Current medications and supplements D. Functional ability and status E.  Nutritional status F.  Physical activity G. Advance directives H. List of other physicians I.  Hospitalizations, surgeries, and ER visits in previous 12 months J.  [place] to include hearing, vision, cognitive, depression L. Referrals and appointments - none  In addition, I have reviewed and discussed with patient certain preventive protocols, quality metrics, and best practice recommendations. A written personalized care plan for preventive services as well as general preventive health recommendations were provided to patient.  See attached scanned questionnaire for additional information.   Signed,   Rich Reining, RN Nurse Health Advisor   Quick Notes   Health Maintenance: Foot exam, eye exam due     Abnormal Screen: MMSe 18/30. Did not pass clock drawing. BP 160/70 (before medication)     Patient Concerns: None     Nurse Concerns: None

## 2016-12-14 ENCOUNTER — Non-Acute Institutional Stay (SKILLED_NURSING_FACILITY): Payer: Medicare Other | Admitting: Nurse Practitioner

## 2016-12-14 ENCOUNTER — Encounter: Payer: Self-pay | Admitting: Nurse Practitioner

## 2016-12-14 DIAGNOSIS — R609 Edema, unspecified: Secondary | ICD-10-CM

## 2016-12-14 DIAGNOSIS — I5042 Chronic combined systolic (congestive) and diastolic (congestive) heart failure: Secondary | ICD-10-CM | POA: Diagnosis not present

## 2016-12-14 DIAGNOSIS — L219 Seborrheic dermatitis, unspecified: Secondary | ICD-10-CM | POA: Diagnosis not present

## 2016-12-14 NOTE — Assessment & Plan Note (Signed)
an acute visit for reported feet/ankle edema,dry cough at times, SOB with exertion. The patient was lying in bed upon my visit, no noted SOB, no O2 desaturation. He denied chest pain, cough, palpitation, or pain. Hx of CHF, hospitalized 11/28/16 to 11/30/16, started on Coreg 3.25mg  bid, Lisinopril 5mg  daily, Furosemide 20mg  qd per Cardiology, his weights stable, weight #180.7 Ibs 12/01/16, #179Ibs today. Echo with EF 30-35% The patient is at risk for decompensation of CHF, continue monitor weight, edema, SOB, cough, sputum production, heart rate, and related s/s of acute on CHF

## 2016-12-14 NOTE — Progress Notes (Signed)
Location:  [facility] Room Number: 21 Place of Service:  SNF (31) Provider: Lennie Odor Claudeen Leason NP  [name], MD  Patient Care Team: [name], MD as PCP - General (Internal Medicine) Maxey Ransom X, NP as Nurse Practitioner (Internal Medicine)  Extended Emergency Contact Information Primary Emergency Contact: [name] Address: [place], [place] Phone: [phone] Mobile Phone: [phone] Relation: Spouse Secondary Emergency Contact: [name] [place] Phone: [phone] Work Phone: [phone] Relation: Daughter  Code Status:  Full Code Goals of care: Advanced Directive information Advanced Directives 12/07/2016  Does Patient Have a Medical Advance Directive? No  Type of Advance Directive -  Does patient want to make changes to medical advance directive? No - Patient declined  Copy of [place] in Chart? -  Would patient like information on creating a medical advance directive? -  Pre-existing out of facility DNR order (yellow form or pink MOST form) -     Chief Complaint  Patient presents with  . Acute Visit    swelling to the lower extremites,    HPI:  Pt is a 83 y.o. male seen today for an acute visit for reported feet/ankle edema,dry cough at times, SOB with exertion. The patient was lying in bed upon my visit, no noted SOB, no O2 desaturation. He denied chest pain, cough, palpitation, or pain. He was also noted to have facial rash, patchy, oily scaly looking, admitted mild itching at times, not noted reported red/flat clothes to arms upon my visit today.   Hx of CHF, hospitalized 11/28/16 to 11/30/16, started on Coreg 3.25mg  bid, Lisinopril 5mg  daily, Furosemide 20mg  qd per Cardiology, his weights stable, weight #180.7 Ibs 12/01/16, #179Ibs today. Echo with EF 30-35%   Past Medical History:  Diagnosis Date  . Abnormality of gait   .  Backache, unspecified   . Colon polyps    adenomatous  . Dementia 11/12/2012  . Diverticulosis   . Essential and other specified forms of tremor   . Hemorrhoids   . History of bilateral hip replacements 11/12/2012  . Memory loss   . 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 Pain in joint, pelvic region and thigh   . Skin cancer of scalp   . Spinal stenosis, lumbar region, without neurogenic claudication   . Unspecified hereditary and idiopathic peripheral neuropathy    Past Surgical History:  Procedure Laterality Date  . CATARACT EXTRACTION, BILATERAL  2012  . RETINAL LASER PROCEDURE  2012   retinal wrinkle  . SKIN CANCER EXCISION    . TOTAL HIP ARTHROPLASTY Left 2000  . TOTAL HIP ARTHROPLASTY (aka REPLACEMENT) Right 2011    Allergies  Allergen Reactions  . Axona [Bacid]     Unknown per MAR  . Gabapentin     Hallucinations    Outpatient Encounter Prescriptions as of 12/14/2016  Medication Sig  . acetaminophen (TYLENOL) 325 MG tablet Take 650 mg by mouth every 4 (four) hours as needed for fever.  Marland Kitchen acetaminophen (TYLENOL) 500 MG tablet Take 500 mg by mouth every 8 (eight) hours as needed (pain).   Marland Kitchen buPROPion (WELLBUTRIN SR) 100 MG 12 hr tablet Take 100 mg by mouth daily.  . carvedilol (COREG) 3.125 MG tablet Take 1 tablet (3.125 mg total) by mouth 2 (two) times daily with a meal.  . Cholecalciferol (VITAMIN D) 1000 UNITS capsule  Take 1,000 Units by mouth daily.    Marland Kitchen dextromethorphan (DELSYM) 30 MG/5ML liquid Take 60 mg by mouth every 12 (twelve) hours as needed for cough. Take 10 mL  . donepezil (ARICEPT) 23 MG TABS tablet Take 1 tablet (23 mg total) by mouth daily.  . dorzolamide-timolol (COSOPT) 22.3-6.8 MG/ML ophthalmic solution Place 1 drop into the left eye 2 (two) times daily.   . Emollient (CERAVE) CREA Apply 1 application topically daily. Apply to arms, legs and trunk  . Flaxseed, Linseed, (FLAXSEED OIL) 1000 MG CAPS Take 1,000 mg by  mouth daily.    . folic acid (FOLVITE) 948 MCG tablet Take 800 mcg by mouth every evening.   . furosemide (LASIX) 20 MG tablet Take 1 tablet (20 mg total) by mouth daily.  . hydrocortisone (PROCTOSOL HC) 2.5 % rectal cream Place 1 application rectally at bedtime as needed for hemorrhoids or anal itching.  . hydrocortisone cream 1 % Apply 1 application topically 2 (two) times daily.  Marland Kitchen lisinopril (PRINIVIL,ZESTRIL) 5 MG tablet Take 1 tablet (5 mg total) by mouth daily.  Marland Kitchen loperamide (IMODIUM A-D) 2 MG tablet Take 2 mg by mouth 4 (four) times daily as needed for diarrhea or loose stools.   . memantine (NAMENDA XR) 28 MG CP24 24 hr capsule Take 1 capsule (28 mg total) by mouth daily.  . metFORMIN (GLUCOPHAGE) 500 MG tablet Take 500 mg by mouth daily.   . mirabegron ER (MYRBETRIQ) 50 MG TB24 tablet Take 50 mg by mouth daily.  . Multiple Vitamin (MULTIVITAMIN) tablet Take 1 tablet by mouth daily.    Marland Kitchen nystatin (NYSTATIN) powder Apply topically 2 (two) times daily. Apply to right and left buttocks.  . potassium chloride (KLOR-CON) 10 MEQ CR tablet Take 10 mEq by mouth 2 (two) times a week. On mondays and fridays  . Probiotic Product (ALIGN) 4 MG CAPS Take 4 mg by mouth daily.  . simvastatin (ZOCOR) 20 MG tablet Take 20 mg by mouth daily.  . white petrolatum ointment Apply 1 application topically daily.   No facility-administered encounter medications on file as of 12/14/2016.    ROS is provided with assistance of staff Review of Systems  Constitutional: Negative for activity change, appetite change, chills, diaphoresis and fatigue.  Respiratory: Positive for shortness of breath. Negative for cough, chest tightness and wheezing.        SOB on exertion per report  Cardiovascular: Positive for leg swelling. Negative for chest pain.       Ankles/feet  Skin: Positive for rash. Negative for color change, pallor and wound.       Oily scaly patches on face  Neurological:       Dementia, able to voice  needs.     Immunization History  Administered Date(s) Administered  . PPD Test 06/24/2016   Pertinent  Health Maintenance Due  Topic Date Due  . INFLUENZA VACCINE  10/31/2016  . FOOT EXAM  09/28/2017 (Originally 06/06/1940)  . OPHTHALMOLOGY EXAM  09/28/2017 (Originally 06/06/1940)  . HEMOGLOBIN A1C  03/27/2017  . PNA vac Low Risk Adult  Completed   Fall Risk  12/07/2016 01/19/2016 07/21/2015  Falls in the past year? No Yes Yes  Number falls in past yr: - 1 1  Injury with Fall? - No No  Risk for fall due to : - Impaired balance/gait -  Follow up - Falls prevention discussed Falls prevention discussed   Functional Status Survey:    Vitals:   12/14/16 1306  BP: 108/70  Pulse: 72  Resp: (!) 22  Temp: (!) 97.4 F (36.3 C)  SpO2: 97%  Weight: 179 lb (81.2 kg)  Height: 6\' 1"  (1.854 m)   Body mass index is 23.62 kg/m. Physical Exam  Constitutional: He appears well-developed and well-nourished. No distress.  Neck: Normal range of motion. No JVD present.  Cardiovascular: Normal rate, regular rhythm, normal heart sounds and intact distal pulses.   No murmur heard. dorsalis pedis pulses intact  Pulmonary/Chest: Effort normal and breath sounds normal. He has no wheezes. He has no rales.  Musculoskeletal: He exhibits edema.  Transfer with SBA, ambulates with walker with one person assistance  Neurological: He is alert.  Oriented to person  Skin: Skin is warm and dry. Rash noted. He is not diaphoretic. No erythema. No pallor.  Oily/scaly rashes on face  Psychiatric: He has a normal mood and affect.    Labs reviewed:  Recent Labs  11/28/16 1129 11/28/16 2315 11/29/16 0232 11/30/16 0429 12/04/16  NA 134*  --  138 140 140  K 5.2*  --  3.3* 3.7 4.0  CL 105  --  103 106  --   CO2 21*  --  28 27  --   GLUCOSE 234*  --  184* 135*  --   BUN 13  --  15 20 15   CREATININE 1.09 0.94 1.11 0.99 1.0  CALCIUM 8.4*  --  8.7* 8.5*  --   MG  --  1.8  --   --   --     Recent Labs   03/16/16 1201 06/23/16 0430 06/26/16 09/25/16 11/28/16 1129  AST 22 18 17 19  60*  ALT 27 23 20 27  37  ALKPHOS 77 66 59 75 73  BILITOT 0.9 0.7  --   --  1.5*  PROT 7.0 5.8*  --   --  5.9*  ALBUMIN 4.0 3.2*  --   --  3.1*    Recent Labs  06/22/16 1057  11/28/16 1129 11/28/16 2315 11/30/16 0429 12/04/16  WBC 18.7*  < > 17.1* 11.2* 9.8 9.0  NEUTROABS 16.0*  --  14.9*  --   --   --   HGB 15.0  < > 14.0 13.8 12.7* 13.7  HCT 43.9  < > 40.4 40.3 37.5* 40*  MCV 96.9  < > 94.4 94.4 95.7  --   PLT 130*  < > 145* 155 128* 165  < > = values in this interval not displayed. Lab Results  Component Value Date   TSH 2.40 06/26/2016   Lab Results  Component Value Date   HGBA1C 6.7 09/25/2016   Lab Results  Component Value Date   CHOL 118 08/16/2016   HDL 53 08/16/2016   LDLCALC 50 08/16/2016   TRIG 66 08/16/2016    Significant Diagnostic Results in last 30 days:  Ct Angio Chest Pe W/cm &/or Wo Cm  Result Date: 11/28/2016 CLINICAL DATA:  Altered mentation, increasing dyspnea, cough and congestion for the past week, positive D-dimer. EXAM: CT ANGIOGRAPHY CHEST WITH CONTRAST TECHNIQUE: Multidetector CT imaging of the chest was performed using the standard protocol during bolus administration of intravenous contrast. Multiplanar CT image reconstructions and MIPs were obtained to evaluate the vascular anatomy. CONTRAST:  100 cc Isovue 370 IV COMPARISON:  11/28/2016 CXR FINDINGS: Cardiovascular: The study is of quality for the evaluation of pulmonary embolism. There are no filling defects in the central, lobar, and segmental pulmonary arteries to suggest acute pulmonary embolism. Great vessels are normal in  course and caliber. Normal heart size. No significant pericardial fluid/thickening. Minimal aortic atherosclerosis without aneurysm. Mediastinum/Nodes: No discrete thyroid nodules. Unremarkable esophagus. No pathologically enlarged axillary, mediastinal or hilar lymph nodes. Lungs/Pleura: No  pneumothorax. No pleural effusion. Small to moderate bilateral pleural effusions with bibasilar compressive and subsegmental atelectasis. Dependent atelectasis is noted along the posterior aspects of both upper lobes. Upper abdomen: Unremarkable. Musculoskeletal: No aggressive appearing focal osseous lesions. There is spondylosis of the included cervical and thoracic spine. No osseous appearing abnormality. Review of the MIP images confirms the above findings. IMPRESSION: 1. Small to moderate bilateral pleural effusions with adjacent atelectasis. 2. No acute pulmonary embolus. 3. Minimal aortic atherosclerosis without aneurysm. Aortic Atherosclerosis (ICD10-I70.0). Electronically Signed   By: Ashley Royalty M.D.   On: 11/28/2016 13:55   Dg Chest Port 1 View  Result Date: 11/28/2016 CLINICAL DATA:  Altered mental status since last night, increased shortness of breath, cough, and congestion for past week, history dementia EXAM: PORTABLE CHEST 1 VIEW COMPARISON:  Portable exam 1145 hours compared to 06/22/2016 FINDINGS: Borderline enlargement of cardiac silhouette. Mediastinal contours and pulmonary vascularity normal. Low lung volumes with bibasilar atelectasis. Remaining lungs clear. No definite pleural effusion or pneumothorax. IMPRESSION: Decreased lung volumes with bibasilar atelectasis. Electronically Signed   By: Lavonia Dana M.D.   On: 11/28/2016 12:04    Assessment/Plan Chronic combined systolic and diastolic congestive heart failure ([place]) an acute visit for reported feet/ankle edema,dry cough at times, SOB with exertion. The patient was lying in bed upon my visit, no noted SOB, no O2 desaturation. He denied chest pain, cough, palpitation, or pain. Hx of CHF, hospitalized 11/28/16 to 11/30/16, started on Coreg 3.25mg  bid, Lisinopril 5mg  daily, Furosemide 20mg  qd per Cardiology, his weights stable, weight #180.7 Ibs 12/01/16, #179Ibs today. Echo with EF 30-35% The patient is at risk for decompensation of  CHF, continue monitor weight, edema, SOB, cough, sputum production, heart rate, and related s/s of acute on CHF  Dermatitis, seborrheic Apply Mycolog II cream hs prn for facial scaly/oily rashes, away from eyes.   Edema CHF contributory, continue to monitor weight and related s/s of decompensation of CHF, continue Furosemide 20mg  daily.      Family/ staff Communication: plan of care reviewed with the patient and charge nurse  Labs/tests ordered: none  Time spend 25 minutes

## 2016-12-14 NOTE — Assessment & Plan Note (Signed)
Apply Mycolog II cream hs prn for facial scaly/oily rashes, away from eyes.

## 2016-12-14 NOTE — Assessment & Plan Note (Signed)
CHF contributory, continue to monitor weight and related s/s of decompensation of CHF, continue Furosemide 20mg  daily.

## 2016-12-18 ENCOUNTER — Encounter: Payer: Self-pay | Admitting: Nurse Practitioner

## 2016-12-18 ENCOUNTER — Ambulatory Visit (INDEPENDENT_AMBULATORY_CARE_PROVIDER_SITE_OTHER): Payer: Medicare Other | Admitting: Nurse Practitioner

## 2016-12-18 ENCOUNTER — Encounter (INDEPENDENT_AMBULATORY_CARE_PROVIDER_SITE_OTHER): Payer: Self-pay

## 2016-12-18 VITALS — BP 110/68 | HR 80 | Ht 72.0 in

## 2016-12-18 DIAGNOSIS — I5021 Acute systolic (congestive) heart failure: Secondary | ICD-10-CM | POA: Diagnosis not present

## 2016-12-18 NOTE — Progress Notes (Signed)
CARDIOLOGY OFFICE NOTE  Date:  12/18/2016    Jose [name] Date of Birth: [birth year] Medical Record #[id number]  PCP:  [name], MD  Cardiologist:  Johnsie Cancel  Chief Complaint  Patient presents with  . Congestive Heart Failure    Post hospital visit - seen for Dr. Johnsie Cancel.     History of Present Illness: Jose [name] is a 83 y.o. male who presents today for a post hospital visit. Seen for Dr. Johnsie Cancel.   He has a history of spinal stenosis, arthritis, dementia and DM.   Presented to the hospital last month with cough and decreased sats on RA. EMS placed on CPAP. Evaluated for heart failure. Found to have EF of 30 to 35%. Started on beta blocker and ACE. Troponin was elevated - felt to be demand ischemia. He diuresed well. No further cardiac testing felt to be warranted.   Comes in today. Here with his wife. She does most of the talking for him. He is back in health care. He was already in health care at [facility] due to a past fall. Was just getting back to walking some when he became acute ill. She says "he will tell you he has no health problems". She notes he still has a cough but seems to be improving. Not productive or he swallows. Not really short of breath. No chest pain. Being worked up for aspirating - he has been placed on "thick it" for his thin liquids. No swelling. She notes that his swelling has really improved since being in rehab - apparently in the past had had more issues with swelling. Not really using salt - his diet is controlled pretty well for the most part.   Past Medical History:  Diagnosis Date  . Abnormality of gait   . Backache, unspecified   . Colon polyps    adenomatous  . Dementia 11/12/2012  . Diverticulosis   . Essential and other specified forms of tremor   . Hemorrhoids   . History of bilateral hip replacements 11/12/2012  . Memory loss   . 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 Pain in joint, pelvic  region and thigh   . Skin cancer of scalp   . Spinal stenosis, lumbar region, without neurogenic claudication   . Unspecified hereditary and idiopathic peripheral neuropathy     Past Surgical History:  Procedure Laterality Date  . CATARACT EXTRACTION, BILATERAL  2012  . RETINAL LASER PROCEDURE  2012   retinal wrinkle  . SKIN CANCER EXCISION    . TOTAL HIP ARTHROPLASTY Left 2000  . TOTAL HIP ARTHROPLASTY (aka REPLACEMENT) Right 2011     Medications: Current Meds  Medication Sig  . acetaminophen (TYLENOL) 325 MG tablet Take 650 mg by mouth every 4 (four) hours as needed for fever.  Marland Kitchen acetaminophen (TYLENOL) 500 MG tablet Take 500 mg by mouth every 8 (eight) hours as needed (pain).   Marland Kitchen buPROPion (WELLBUTRIN SR) 100 MG 12 hr tablet Take 100 mg by mouth daily.  . carvedilol (COREG) 3.125 MG tablet Take 1 tablet (3.125 mg total) by mouth 2 (two) times daily with a meal.  . Cholecalciferol (VITAMIN D) 1000 UNITS capsule Take 1,000 Units by mouth daily.    Marland Kitchen dextromethorphan (DELSYM) 30 MG/5ML liquid Take 60 mg by mouth every 12 (twelve) hours as needed for cough. Take 10 mL  . donepezil (ARICEPT) 23 MG TABS tablet Take 1 tablet (23 mg total) by  mouth daily.  . dorzolamide-timolol (COSOPT) 22.3-6.8 MG/ML ophthalmic solution Place 1 drop into the left eye 2 (two) times daily.   . Emollient (CERAVE) CREA Apply 1 application topically daily. Apply to arms, legs and trunk  . Flaxseed, Linseed, (FLAXSEED OIL) 1000 MG CAPS Take 1,000 mg by mouth daily.    . folic acid (FOLVITE) 144 MCG tablet Take 800 mcg by mouth every evening.   . furosemide (LASIX) 20 MG tablet Take 1 tablet (20 mg total) by mouth daily.  . hydrocortisone (PROCTOSOL HC) 2.5 % rectal cream Place 1 application rectally at bedtime as needed for hemorrhoids or anal itching.  . hydrocortisone cream 1 % Apply 1 application topically 2 (two) times daily.  Marland Kitchen lisinopril (PRINIVIL,ZESTRIL) 5 MG tablet Take 1 tablet (5 mg total) by mouth  daily.  Marland Kitchen loperamide (IMODIUM A-D) 2 MG tablet Take 2 mg by mouth 4 (four) times daily as needed for diarrhea or loose stools.   . memantine (NAMENDA XR) 28 MG CP24 24 hr capsule Take 1 capsule (28 mg total) by mouth daily.  . metFORMIN (GLUCOPHAGE) 500 MG tablet Take 500 mg by mouth daily.   . mirabegron ER (MYRBETRIQ) 50 MG TB24 tablet Take 50 mg by mouth daily.  . Multiple Vitamin (MULTIVITAMIN) tablet Take 1 tablet by mouth daily.    Marland Kitchen nystatin (NYSTATIN) powder Apply topically 2 (two) times daily. Apply to right and left buttocks.  . potassium chloride (KLOR-CON) 10 MEQ CR tablet Take 10 mEq by mouth 2 (two) times a week. On mondays and fridays  . Probiotic Product (ALIGN) 4 MG CAPS Take 4 mg by mouth daily.  . simvastatin (ZOCOR) 20 MG tablet Take 20 mg by mouth daily.  . traMADol (ULTRAM) 50 MG tablet Take 50 mg by mouth every 6 (six) hours as needed.  . white petrolatum ointment Apply 1 application topically daily.     Allergies: Allergies  Allergen Reactions  . Axona [Bacid]     Unknown per MAR  . Gabapentin     Hallucinations    Social History: The patient  reports that he has quit smoking. His smoking use included Cigarettes. He has never used smokeless tobacco. He reports that he drinks about 8.4 oz of alcohol per week . He reports that he does not use drugs.   Family History: The patient's family history includes Heart failure in his mother.   Review of Systems: Please see the history of present illness.   Otherwise, the review of systems is positive for none.   All other systems are reviewed and negative.   Physical Exam: VS:  BP 110/68 (BP Location: Left Arm, Patient Position: Sitting, Cuff Size: Normal)   Pulse 80   Ht 6' (1.829 m)  .  BMI There is no height or weight on file to calculate BMI.  Wt Readings from Last 3 Encounters:  12/14/16 179 lb (81.2 kg)  12/06/16 179 lb (81.2 kg)  12/06/16 181 lb 4.8 oz (82.2 kg)    General: Pleasant. Elderly male. He is  alert and in no acute distress.  He is in a wheelchair.  HEENT: Normal.  Neck: Supple, no JVD, carotid bruits, or masses noted.  Cardiac: Regular rate and rhythm. No murmurs, rubs, or gallops. No edema.  Respiratory:  Lungs are clear to auscultation bilaterally with normal work of breathing.  GI: Soft and nontender.  MS: No deformity or atrophy. Gait not tested.  Skin: Warm and dry. Color is normal.  Neuro:  Strength and sensation are intact and no gross focal deficits noted.  Psych: Alert, appropriate and with normal affect.   LABORATORY DATA:  EKG:  EKG is not ordered today.  Lab Results  Component Value Date   WBC 9.0 12/04/2016   HGB 13.7 12/04/2016   HCT 40 (A) 12/04/2016   PLT 165 12/04/2016   GLUCOSE 135 (H) 11/30/2016   CHOL 118 08/16/2016   TRIG 66 08/16/2016   HDL 53 08/16/2016   LDLCALC 50 08/16/2016   ALT 37 11/28/2016   AST 60 (H) 11/28/2016   NA 140 12/04/2016   K 4.0 12/04/2016   CL 106 11/30/2016   CREATININE 1.0 12/04/2016   BUN 15 12/04/2016   CO2 27 11/30/2016   TSH 2.40 06/26/2016   INR 1.00 12/13/2009   HGBA1C 6.7 09/25/2016     BNP (last 3 results)  Recent Labs  11/28/16 1129  BNP 994.3*    ProBNP (last 3 results) No results for input(s): PROBNP in the last 8760 hours.   Other Studies Reviewed Today:  Echo Study Conclusions 10/2016  - Left ventricle: The cavity size was normal. There was moderate   concentric hypertrophy. Systolic function was moderately to   severely reduced. The estimated ejection fraction was in the   range of 30% to 35%. Diffuse hypokinesis worse in the   inferoseptal myocardium. Features are consistent with a   pseudonormal left ventricular filling pattern, with concomitant   abnormal relaxation and increased filling pressure (grade 2   diastolic dysfunction). Doppler parameters are consistent with   high ventricular filling pressure. - Aortic valve: Transvalvular velocity was within the normal range.    There was no stenosis. There was mild regurgitation. Valve area   (Vmax): 1.72 cm^2. - Mitral valve: Mildly calcified annulus. Transvalvular velocity   was within the normal range. There was no evidence for stenosis.   There was no regurgitation. - Left atrium: The atrium was moderately dilated. - Right ventricle: The cavity size was normal. Wall thickness was   normal. Systolic function was normal. - Right atrium: The atrium was moderately to severely dilated. - Tricuspid valve: There was no regurgitation. - Pulmonary arteries: PA peak pressure: 12 mm Hg (S).   Assessment/Plan: 1. Recent acute on chronic systolic HF - he has improved clinically. BP soft - would continue with his current regimen - on ACE and beta blocker. If cough fails to improve - may need to change to ARB. He is on salt restricted diet. No chest pain. Looks better. Recheck his lab today.   2. Dementia  3. ?aspiration - being evaluated at SNF.   4. Elevated troponin - felt to be from demand ischemia - no further cardiac testing felt to be warranted.   Current medicines are reviewed with the patient today.  The patient does not have concerns regarding medicines other than what has been noted above.  The following changes have been made:  See above.  Labs/ tests ordered today include:    Orders Placed This Encounter  Procedures  . Basic metabolic panel  . Pro b natriuretic peptide (BNP)     Disposition:   FU with me in 3 months.   Patient is agreeable to this plan and will call if any problems develop in the interim.   SignedTruitt Merle, NP  12/18/2016 2:49 PM  [facility] [address] [place], [place]  [address] Phone: [phone] Fax: [phone]

## 2016-12-18 NOTE — Patient Instructions (Signed)
We will be checking the following labs today - BMET and BNP   Medication Instructions:    Continue with your current medicines.     Testing/Procedures To Be Arranged:  N/A  Follow-Up:   See me in about 2 to 3 months.     Other Special Instructions:   N/A    If you need a refill on your cardiac medications before your next appointment, please call your pharmacy.   Call the [place] office at [phone] if you have any questions, problems or concerns.

## 2016-12-19 LAB — BASIC METABOLIC PANEL
BUN/Creatinine Ratio: 14 (ref 10–24)
BUN: 15 mg/dL (ref 8–27)
CO2: 21 mmol/L (ref 20–29)
Calcium: 9 mg/dL (ref 8.6–10.2)
Chloride: 102 mmol/L (ref 96–106)
Creatinine, Ser: 1.04 mg/dL (ref 0.76–1.27)
GFR calc Af Amer: 76 mL/min/{1.73_m2} (ref >59)
GFR calc non Af Amer: 66 mL/min/{1.73_m2} (ref >59)
Glucose: 141 mg/dL — ABNORMAL HIGH (ref 65–99)
Potassium: 4.6 mmol/L (ref 3.5–5.2)
Sodium: 142 mmol/L (ref 134–144)

## 2016-12-19 LAB — PRO B NATRIURETIC PEPTIDE: NT-Pro BNP: 1931 pg/mL — ABNORMAL HIGH (ref 0–486)

## 2016-12-21 NOTE — Progress Notes (Signed)
   11/29/16 1015  PT G-Codes **NOT FOR INPATIENT CLASS**  Functional Assessment Tool Used AM-PAC 6 Clicks Basic Mobility;Clinical judgement  Functional Limitation Mobility: Walking and moving around  Mobility: Walking and Moving Around Current Status (S3419) CI  Mobility: Walking and Moving Around Goal Status (Q2229) CI  entered for evaluating PT , Weston Anna based upon her documentation .  Clide Dales, PT Pager: [phone] 12/21/2016

## 2016-12-25 ENCOUNTER — Encounter: Payer: Self-pay | Admitting: Internal Medicine

## 2016-12-25 ENCOUNTER — Non-Acute Institutional Stay (SKILLED_NURSING_FACILITY): Payer: Medicare Other | Admitting: Internal Medicine

## 2016-12-25 DIAGNOSIS — I5042 Chronic combined systolic (congestive) and diastolic (congestive) heart failure: Secondary | ICD-10-CM

## 2016-12-25 DIAGNOSIS — F0280 Dementia in other diseases classified elsewhere without behavioral disturbance: Secondary | ICD-10-CM | POA: Diagnosis not present

## 2016-12-25 DIAGNOSIS — M5442 Lumbago with sciatica, left side: Secondary | ICD-10-CM | POA: Diagnosis not present

## 2016-12-25 DIAGNOSIS — R32 Unspecified urinary incontinence: Secondary | ICD-10-CM | POA: Diagnosis not present

## 2016-12-25 DIAGNOSIS — E1122 Type 2 diabetes mellitus with diabetic chronic kidney disease: Secondary | ICD-10-CM | POA: Diagnosis not present

## 2016-12-25 DIAGNOSIS — M5441 Lumbago with sciatica, right side: Secondary | ICD-10-CM

## 2016-12-25 DIAGNOSIS — Z794 Long term (current) use of insulin: Secondary | ICD-10-CM

## 2016-12-25 DIAGNOSIS — G309 Alzheimer's disease, unspecified: Secondary | ICD-10-CM | POA: Diagnosis not present

## 2016-12-25 DIAGNOSIS — G8929 Other chronic pain: Secondary | ICD-10-CM

## 2016-12-25 NOTE — Progress Notes (Signed)
Provider:  [name] MD  Location:  [facility] Room Number: 50 Place of Service:  SNF (31)  PCP: [name], MD Patient Care Team: [name], MD as PCP - General (Internal Medicine) Mast, Man X, NP as Nurse Practitioner (Internal Medicine)  Extended Emergency Contact Information Primary Emergency Contact: [name] Address: [address], [place] Phone: [phone] Mobile Phone: [phone] Relation: Spouse Secondary Emergency Contact: [name] [place] Phone: [phone] Work Phone: [phone] Relation: Daughter  Code Status: Full Code  Goals of Care: Advanced Directive information Advanced Directives 12/07/2016  Does Patient Have a Medical Advance Directive? No  Type of Advance Directive -  Does patient want to make changes to medical advance directive? No - Patient declined  Copy of Harper in Chart? -  Would patient like information on creating a medical advance directive? -  Pre-existing out of facility DNR order (yellow form or pink MOST form) -      Chief Complaint  Patient presents with  . Discharge Note    Discharge Visit     HPI: Patient is a 83 y.o. male seen today for discharge visit. He has been here for long term care and is now being transferred to another SNF. He has been working with therapy team. He has chronic systolic and diastolic CHF with EF 37-62%, dementia, diabetes, OA and lumbar stenosis among others. He is out of bed daily. He gets around on his wheelchair. No fall has been reported. He needs 1 person assistance with his ADLs and transfer. He is incontinent of bowel and bladder. He feeds himself. He has been compliant with his medication.     Past Medical History:  Diagnosis Date  . Abnormality of gait   . Backache, unspecified   . Colon polyps    adenomatous  . Dementia 11/12/2012  . Diverticulosis    . Essential and other specified forms of tremor   . Hemorrhoids   . History of bilateral hip replacements 11/12/2012  . Memory loss   . 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 Pain in joint, pelvic region and thigh   . Skin cancer of scalp   . Spinal stenosis, lumbar region, without neurogenic claudication   . Unspecified hereditary and idiopathic peripheral neuropathy    Past Surgical History:  Procedure Laterality Date  . CATARACT EXTRACTION, BILATERAL  2012  . RETINAL LASER PROCEDURE  2012   retinal wrinkle  . SKIN CANCER EXCISION    . TOTAL HIP ARTHROPLASTY Left 2000  . TOTAL HIP ARTHROPLASTY (aka REPLACEMENT) Right 2011    reports that he has quit smoking. His smoking use included Cigarettes. He has never used smokeless tobacco. He reports that he drinks about 8.4 oz of alcohol per week . He reports that he does not use drugs. Social History   Social History  . Marital status: Married    Spouse name: [name]  . Number of children: 2  . Years of education: lawyer   Occupational History  . Retired [employer]    retired   Social History Main Topics  . Smoking status: Former Smoker    Types: Cigarettes  . Smokeless tobacco: Never Used  . Alcohol use 8.4 oz/week    14 Glasses of wine per week     Comment: occas, one glass of wine before  dinner,sometimes 1/2 glass  . Drug use: No  . Sexual activity: No   Other Topics Concern  . Not on file   Social History Narrative   Patient is right handed and resides in home with wife    Functional Status Survey:    Family History  Problem Relation Age of Onset  . Heart failure Mother     Health Maintenance  Topic Date Due  . INFLUENZA VACCINE  10/31/2016  . FOOT EXAM  09/28/2017 (Originally 06/06/1940)  . OPHTHALMOLOGY EXAM  09/28/2017 (Originally 06/06/1940)  . TETANUS/TDAP  09/29/2026 (Originally 12/20/2020)  . HEMOGLOBIN A1C  03/27/2017  . PNA vac Low Risk Adult  Completed     Allergies  Allergen Reactions  . Axona [Bacid]     Unknown per MAR  . Gabapentin     Hallucinations    Outpatient Encounter Prescriptions as of 12/25/2016  Medication Sig  . acetaminophen (TYLENOL) 325 MG tablet Take 650 mg by mouth every 4 (four) hours as needed for fever.  Marland Kitchen acetaminophen (TYLENOL) 500 MG tablet Take 500 mg by mouth every 8 (eight) hours as needed (pain).   Marland Kitchen buPROPion (WELLBUTRIN SR) 100 MG 12 hr tablet Take 100 mg by mouth daily.  . carvedilol (COREG) 3.125 MG tablet Take 1 tablet (3.125 mg total) by mouth 2 (two) times daily with a meal.  . Cholecalciferol (VITAMIN D) 1000 UNITS capsule Take 1,000 Units by mouth daily.    Marland Kitchen dextromethorphan (DELSYM) 30 MG/5ML liquid Take 60 mg by mouth every 12 (twelve) hours as needed for cough. Take 10 mL  . donepezil (ARICEPT) 23 MG TABS tablet Take 1 tablet (23 mg total) by mouth daily.  . dorzolamide-timolol (COSOPT) 22.3-6.8 MG/ML ophthalmic solution Place 1 drop into the left eye 2 (two) times daily.   . Emollient (CERAVE) CREA Apply 1 application topically daily. Apply to arms, legs and trunk  . Flaxseed, Linseed, (FLAXSEED OIL) 1000 MG CAPS Take 1,000 mg by mouth daily.    . folic acid (FOLVITE) 601 MCG tablet Take 800 mcg by mouth every evening.   . furosemide (LASIX) 20 MG tablet Take 1 tablet (20 mg total) by mouth daily.  . hydrocortisone (PROCTOSOL HC) 2.5 % rectal cream Place 1 application rectally at bedtime as needed for hemorrhoids or anal itching.  . hydrocortisone cream 1 % Apply 1 application topically 2 (two) times daily.  Marland Kitchen lisinopril (PRINIVIL,ZESTRIL) 5 MG tablet Take 1 tablet (5 mg total) by mouth daily.  Marland Kitchen loperamide (IMODIUM A-D) 2 MG tablet Take 2 mg by mouth 4 (four) times daily as needed for diarrhea or loose stools.   . memantine (NAMENDA XR) 28 MG CP24 24 hr capsule Take 1 capsule (28 mg total) by mouth daily.  . metFORMIN (GLUCOPHAGE) 500 MG tablet Take 500 mg by mouth daily.   . mirabegron ER  (MYRBETRIQ) 50 MG TB24 tablet Take 50 mg by mouth daily.  . Multiple Vitamin (MULTIVITAMIN) tablet Take 1 tablet by mouth daily.    Marland Kitchen nystatin (NYSTATIN) powder Apply topically 2 (two) times daily. Apply to right and left buttocks.  . nystatin-triamcinolone (MYCOLOG II) cream Apply 1 application topically at bedtime as needed (Apply to facial area away from eyes).  . potassium chloride (KLOR-CON) 10 MEQ CR tablet Take 10 mEq by mouth 2 (two) times a week. On mondays and fridays  . Probiotic Product (ALIGN) 4 MG CAPS Take 4 mg by mouth daily.  . simvastatin (ZOCOR) 20 MG tablet Take 20 mg  by mouth daily.  . [DISCONTINUED] traMADol (ULTRAM) 50 MG tablet Take 50 mg by mouth every 6 (six) hours as needed.  . [DISCONTINUED] white petrolatum ointment Apply 1 application topically daily.   No facility-administered encounter medications on file as of 12/25/2016.     Review of Systems  Constitutional: Negative for appetite change, chills, fatigue and fever.  HENT: Negative for congestion, mouth sores, sore throat and trouble swallowing.   Eyes:       Wears glasses.  Respiratory: Positive for cough. Negative for chest tightness, shortness of breath and wheezing.           Cardiovascular: Negative for chest pain and palpitations.  Gastrointestinal: Negative for abdominal pain, constipation, diarrhea, nausea and vomiting.  Genitourinary: Negative for dysuria.       Has urinary incontience  Musculoskeletal: Positive for back pain and gait problem.       On wheelchair, has chronic back pain  Neurological: Negative for syncope.  Psychiatric/Behavioral: Positive for confusion. Negative for behavioral problems.    Vitals:   12/25/16 1521  BP: 124/86  Pulse: 96  Resp: 18  Temp: 98.3 F (36.8 C)  TempSrc: Oral  SpO2: 97%  Weight: 181 lb 4.8 oz (82.2 kg)  Height: 6' (1.829 m)   Body mass index is 24.59 kg/m. Physical Exam  Constitutional: He appears well-developed and well-nourished. No  distress.  HENT:  Head: Normocephalic and atraumatic.  Mouth/Throat: Oropharynx is clear and moist.  Eyes: Pupils are equal, round, and reactive to light.  Neck: Normal range of motion. Neck supple.  Cardiovascular: Normal rate and regular rhythm.   Pulmonary/Chest: Effort normal. No respiratory distress. He has no wheezes. He has no rales.  Decreased air entry to lung bases  Abdominal: Soft. Bowel sounds are normal. There is no tenderness. There is no guarding.  Musculoskeletal: He exhibits edema.  Able to move all 4 extremities, on wheelchair, arthritis changes to his fingers  Lymphadenopathy:    He has no cervical adenopathy.  Neurological: He is alert.  Oriented to self and place but not to time  Skin: Skin is warm and dry. No rash noted. He is not diaphoretic. No erythema.  1+ leg edema  Psychiatric: He has a normal mood and affect. His behavior is normal.    Labs reviewed: Basic Metabolic Panel:  Recent Labs  11/28/16 2315 11/29/16 0232 11/30/16 0429 12/04/16 12/18/16 1503  NA  --  138 140 140 142  K  --  3.3* 3.7 4.0 4.6  CL  --  103 106  --  102  CO2  --  28 27  --  21  GLUCOSE  --  184* 135*  --  141*  BUN  --  15 20 15 15   CREATININE 0.94 1.11 0.99 1.0 1.04  CALCIUM  --  8.7* 8.5*  --  9.0  MG 1.8  --   --   --   --    Liver Function Tests:  Recent Labs  03/16/16 1201 06/23/16 0430 06/26/16 09/25/16 11/28/16 1129  AST 22 18 17 19  60*  ALT 27 23 20 27  37  ALKPHOS 77 66 59 75 73  BILITOT 0.9 0.7  --   --  1.5*  PROT 7.0 5.8*  --   --  5.9*  ALBUMIN 4.0 3.2*  --   --  3.1*    Recent Labs  03/16/16 1201  LIPASE 25   No results for input(s): AMMONIA in the last 8760 hours. CBC:  Recent Labs  06/22/16 1057  11/28/16 1129 11/28/16 2315 11/30/16 0429 12/04/16  WBC 18.7*  < > 17.1* 11.2* 9.8 9.0  NEUTROABS 16.0*  --  14.9*  --   --   --   HGB 15.0  < > 14.0 13.8 12.7* 13.7  HCT 43.9  < > 40.4 40.3 37.5* 40*  MCV 96.9  < > 94.4 94.4 95.7  --     PLT 130*  < > 145* 155 128* 165  < > = values in this interval not displayed. Cardiac Enzymes:  Recent Labs  11/28/16 2315 11/29/16 0232 11/29/16 0834  TROPONINI 0.17* 0.15* 0.09*   BNP: Invalid input(s): POCBNP Lab Results  Component Value Date   HGBA1C 6.7 09/25/2016   Lab Results  Component Value Date   TSH 2.40 06/26/2016   No results found for: VITAMINB12 No results found for: FOLATE No results found for: IRON, TIBC, FERRITIN  Imaging and Procedures obtained prior to SNF admission: Ct Angio Chest Pe W/cm &/or Wo Cm  Result Date: 11/28/2016 CLINICAL DATA:  Altered mentation, increasing dyspnea, cough and congestion for the past week, positive D-dimer. EXAM: CT ANGIOGRAPHY CHEST WITH CONTRAST TECHNIQUE: Multidetector CT imaging of the chest was performed using the standard protocol during bolus administration of intravenous contrast. Multiplanar CT image reconstructions and MIPs were obtained to evaluate the vascular anatomy. CONTRAST:  100 cc Isovue 370 IV COMPARISON:  11/28/2016 CXR FINDINGS: Cardiovascular: The study is of quality for the evaluation of pulmonary embolism. There are no filling defects in the central, lobar, and segmental pulmonary arteries to suggest acute pulmonary embolism. Great vessels are normal in course and caliber. Normal heart size. No significant pericardial fluid/thickening. Minimal aortic atherosclerosis without aneurysm. Mediastinum/Nodes: No discrete thyroid nodules. Unremarkable esophagus. No pathologically enlarged axillary, mediastinal or hilar lymph nodes. Lungs/Pleura: No pneumothorax. No pleural effusion. Small to moderate bilateral pleural effusions with bibasilar compressive and subsegmental atelectasis. Dependent atelectasis is noted along the posterior aspects of both upper lobes. Upper abdomen: Unremarkable. Musculoskeletal: No aggressive appearing focal osseous lesions. There is spondylosis of the included cervical and thoracic spine. No  osseous appearing abnormality. Review of the MIP images confirms the above findings. IMPRESSION: 1. Small to moderate bilateral pleural effusions with adjacent atelectasis. 2. No acute pulmonary embolus. 3. Minimal aortic atherosclerosis without aneurysm. Aortic Atherosclerosis (ICD10-I70.0). Electronically Signed   By: Ashley Royalty M.D.   On: 11/28/2016 13:55   Dg Chest Port 1 View  Result Date: 11/28/2016 CLINICAL DATA:  Altered mental status since last night, increased shortness of breath, cough, and congestion for past week, history dementia EXAM: PORTABLE CHEST 1 VIEW COMPARISON:  Portable exam 1145 hours compared to 06/22/2016 FINDINGS: Borderline enlargement of cardiac silhouette. Mediastinal contours and pulmonary vascularity normal. Low lung volumes with bibasilar atelectasis. Remaining lungs clear. No definite pleural effusion or pneumothorax. IMPRESSION: Decreased lung volumes with bibasilar atelectasis. Electronically Signed   By: Lavonia Dana M.D.   On: 11/28/2016 12:04    Assessment/Plan  Patient is stable to be discharged to another SNF for long term care. I have filled out FL2 form and reviewed his medication.   Alzheimer's dementia Provide supportive care. Constantly disoriented. Continue memantine and donepezil.  Chronic CHF With recent hospitalization for acute exacerbation. Continue lisinopril 5 mg daily, carvedilol 3.125 mg bid and furosemide 20 mg daily. Continue kcl  Type 2 diabetes mellitus with ckd Lab Results  Component Value Date   HGBA1C 6.7 09/25/2016   a1c is suggestive of controlled  diabetes. Continue metformin 500 mg daily and monitor  Chronic back pain Continue acetaminophen 500 mg q8h prn pain  UI Continue mirabegron 50 mg daily and monitor. Provide perineal care.     Family/ staff Communication: reviewed care plan with patient and charge nurse. FL2 form has been filled out and signed.    [name], MD Internal Medicine [facility] [address] [place], [place] [address] Cell Phone (Monday-Friday 8 am - 5 pm): [phone] On Call: [phone] and follow prompts after 5 pm and on weekends Office Phone: [phone] Office Fax: [phone]

## 2016-12-27 LAB — LIPID PANEL
Cholesterol: 135
Cholesterol: 135 (ref 0–200)
HDL: 48 (ref 35–70)
HDL: 48
LDL (calc): 72
LDL CALC: 72
TRIGLYCERIDES: 72
Triglycerides: 72 (ref 40–160)

## 2016-12-27 LAB — HEMOGLOBIN A1C
A1c: 6.9
HEMOGLOBIN A1C: 6.9

## 2016-12-28 ENCOUNTER — Telehealth: Payer: Self-pay | Admitting: Family

## 2016-12-28 ENCOUNTER — Encounter: Payer: Self-pay | Admitting: *Deleted

## 2016-12-28 NOTE — Telephone Encounter (Signed)
Tylenol 650 mg Tablet discontinued. Continue on Extra strength tylenol 500 mg Tablet every 8 hours.

## 2016-12-31 ENCOUNTER — Encounter: Payer: Self-pay | Admitting: Internal Medicine

## 2016-12-31 ENCOUNTER — Non-Acute Institutional Stay (SKILLED_NURSING_FACILITY): Payer: Medicare Other | Admitting: Internal Medicine

## 2016-12-31 DIAGNOSIS — N182 Chronic kidney disease, stage 2 (mild): Secondary | ICD-10-CM

## 2016-12-31 DIAGNOSIS — K644 Residual hemorrhoidal skin tags: Secondary | ICD-10-CM | POA: Diagnosis not present

## 2016-12-31 DIAGNOSIS — E1122 Type 2 diabetes mellitus with diabetic chronic kidney disease: Secondary | ICD-10-CM | POA: Diagnosis not present

## 2016-12-31 DIAGNOSIS — M5441 Lumbago with sciatica, right side: Secondary | ICD-10-CM

## 2016-12-31 DIAGNOSIS — G309 Alzheimer's disease, unspecified: Secondary | ICD-10-CM

## 2016-12-31 DIAGNOSIS — R32 Unspecified urinary incontinence: Secondary | ICD-10-CM | POA: Insufficient documentation

## 2016-12-31 DIAGNOSIS — R1310 Dysphagia, unspecified: Secondary | ICD-10-CM | POA: Insufficient documentation

## 2016-12-31 DIAGNOSIS — M5442 Lumbago with sciatica, left side: Secondary | ICD-10-CM | POA: Diagnosis not present

## 2016-12-31 DIAGNOSIS — E1169 Type 2 diabetes mellitus with other specified complication: Secondary | ICD-10-CM | POA: Diagnosis not present

## 2016-12-31 DIAGNOSIS — I5042 Chronic combined systolic (congestive) and diastolic (congestive) heart failure: Secondary | ICD-10-CM | POA: Diagnosis not present

## 2016-12-31 DIAGNOSIS — F339 Major depressive disorder, recurrent, unspecified: Secondary | ICD-10-CM | POA: Diagnosis not present

## 2016-12-31 DIAGNOSIS — F0280 Dementia in other diseases classified elsewhere without behavioral disturbance: Secondary | ICD-10-CM

## 2016-12-31 DIAGNOSIS — E785 Hyperlipidemia, unspecified: Secondary | ICD-10-CM

## 2016-12-31 DIAGNOSIS — R1312 Dysphagia, oropharyngeal phase: Secondary | ICD-10-CM | POA: Diagnosis not present

## 2016-12-31 DIAGNOSIS — G8929 Other chronic pain: Secondary | ICD-10-CM

## 2016-12-31 NOTE — Progress Notes (Signed)
Provider:  [name] MD  Location:  [facility] Room Number: 60 Place of Service:  SNF (31)  PCP: [name], MD Patient Care Team: [name], MD as PCP - General (Internal Medicine) [name], [name], NP as Nurse Practitioner (Family Medicine)  Extended Emergency Contact Information Primary Emergency Contact: [name] Address: [address], [place] Phone: [phone] Mobile Phone: [phone] Relation: Spouse Secondary Emergency Contact: [name] [place] Phone: [phone] Work Phone: [phone] Relation: Daughter  Code Status: Full Code Goals of Care: Advanced Directive information Advanced Directives 12/31/2016  Does Patient Have a Medical Advance Directive? Yes  Type of Paramedic of [place];Living will  Does patient want to make changes to medical advance directive? No - Patient declined  Copy of [place] in Chart? Yes  Would patient like information on creating a medical advance directive? -  Pre-existing out of facility DNR order (yellow form or pink MOST form) -      Chief Complaint  Patient presents with  . New Admit To SNF    New Admission Visit     HPI: Patient is a 83 y.o. male seen today for admission visit. He was residing in another SNF prior to this and now has been transferred to [facility] for long term care. He has dementia along with other medical co-morbidities and needs assistance with his ADLs.   Chronic CHF- denies dyspnea or chest pain. Taking lisinopril, carvedilol and furosemide with kcl supplement.   Chronic respiratory failure- stable, on room air.   Type 2 DM with ckd- Currently on metformin 500 mg daily.   Alzheimer's dementia- has confusion, able to communicate his needs. Needs assistance with ADLs. Currently on memantine and donepezil  Chronic low back pain- Denies pain this  visit, taking tylenol 500 mg q8h prn pain. Takes vit d supplement  OAB- Currently on mirabegron 50 mg daily  Hyperlipidemia- tolerating simvastatin well.   Chronic depression- mood appears stable this visit. Taking wellbutrin 100 mg daily.   External hemorrhoids- no rectal bleed reported. Currently on hydrocortisone rectal cream daily as needed.   Past Medical History:  Diagnosis Date  . Abnormality of gait   . Backache, unspecified   . Colon polyps    adenomatous  . Dementia 11/12/2012  . Diverticulosis   . Essential and other specified forms of tremor   . Hemorrhoids   . History of bilateral hip replacements 11/12/2012  . Memory loss   . 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 Pain in joint, pelvic region and thigh   . Skin cancer of scalp   . Spinal stenosis, lumbar region, without neurogenic claudication   . Unspecified hereditary and idiopathic peripheral neuropathy    Past Surgical History:  Procedure Laterality Date  . CATARACT EXTRACTION, BILATERAL  2012  . RETINAL LASER PROCEDURE  2012   retinal wrinkle  . SKIN CANCER EXCISION    . TOTAL HIP ARTHROPLASTY Left 2000  . TOTAL HIP ARTHROPLASTY (aka REPLACEMENT) Right 2011    reports that he has quit smoking. His smoking use included Cigarettes. He has never used smokeless tobacco. He reports that he drinks about 8.4 oz of alcohol per week . He reports that he does not use drugs. Social History   Social History  . Marital status: Married    Spouse name: [name]  .  Number of children: 2  . Years of education: lawyer   Occupational History  . Retired [employer]    retired   Social History Main Topics  . Smoking status: Former Smoker    Types: Cigarettes  . Smokeless tobacco: Never Used  . Alcohol use 8.4 oz/week    14 Glasses of wine per week     Comment: occas, one glass of wine before dinner,sometimes 1/2 glass  . Drug use: No  . Sexual activity: No   Other Topics  Concern  . Not on file   Social History Narrative   Patient is right handed and resides in home with wife    Functional Status Survey:    Family History  Problem Relation Age of Onset  . Heart failure Mother     Health Maintenance  Topic Date Due  . INFLUENZA VACCINE  01/10/2017 (Originally 10/31/2016)  . FOOT EXAM  09/28/2017 (Originally 06/06/1940)  . OPHTHALMOLOGY EXAM  09/28/2017 (Originally 06/06/1940)  . TETANUS/TDAP  09/29/2026 (Originally 12/20/2020)  . HEMOGLOBIN A1C  06/26/2017  . PNA vac Low Risk Adult  Completed    Allergies  Allergen Reactions  . Axona [Bacid]     Unknown per MAR  . Gabapentin     Hallucinations    Outpatient Encounter Prescriptions as of 12/31/2016  Medication Sig  . acetaminophen (TYLENOL) 500 MG tablet Take 500 mg by mouth every 8 (eight) hours as needed (pain).   Marland Kitchen buPROPion (WELLBUTRIN SR) 100 MG 12 hr tablet Take 100 mg by mouth daily.  . carvedilol (COREG) 3.125 MG tablet Take 1 tablet (3.125 mg total) by mouth 2 (two) times daily with a meal.  . Cholecalciferol (VITAMIN D) 1000 UNITS capsule Take 1,000 Units by mouth daily.    Marland Kitchen dextromethorphan (DELSYM) 30 MG/5ML liquid Take 60 mg by mouth every 12 (twelve) hours as needed for cough. Take 10 mL  . donepezil (ARICEPT) 23 MG TABS tablet Take 1 tablet (23 mg total) by mouth daily.  . dorzolamide-timolol (COSOPT) 22.3-6.8 MG/ML ophthalmic solution Place 1 drop into the left eye 2 (two) times daily.   . Emollient (CERAVE) CREA Apply 1 application topically daily. Apply to arms, legs and trunk  . Flaxseed, Linseed, (FLAXSEED OIL) 1000 MG CAPS Take 1,000 mg by mouth daily.    . folic acid (FOLVITE) 976 MCG tablet Take 800 mcg by mouth every evening.   . furosemide (LASIX) 20 MG tablet Take 1 tablet (20 mg total) by mouth daily.  . hydrocortisone (PROCTOSOL HC) 2.5 % rectal cream Place 1 application rectally at bedtime as needed for hemorrhoids or anal itching.  . hydrocortisone cream 1 % Apply 1  application topically 2 (two) times daily.  Marland Kitchen lisinopril (PRINIVIL,ZESTRIL) 5 MG tablet Take 1 tablet (5 mg total) by mouth daily.  Marland Kitchen loperamide (IMODIUM A-D) 2 MG tablet Take 2 mg by mouth 4 (four) times daily as needed for diarrhea or loose stools.   . memantine (NAMENDA XR) 28 MG CP24 24 hr capsule Take 1 capsule (28 mg total) by mouth daily.  . metFORMIN (GLUCOPHAGE) 500 MG tablet Take 500 mg by mouth daily.   . mirabegron ER (MYRBETRIQ) 50 MG TB24 tablet Take 50 mg by mouth daily.  . Multiple Vitamin (MULTIVITAMIN) tablet Take 1 tablet by mouth daily.    Marland Kitchen nystatin (NYSTATIN) powder Apply topically 2 (two) times daily. Apply to right and left buttocks.  . nystatin-triamcinolone (MYCOLOG II) cream Apply 1 application topically at bedtime as  needed (Apply to facial area away from eyes).  . potassium chloride (KLOR-CON) 10 MEQ CR tablet Take 10 mEq by mouth 2 (two) times a week. On mondays and fridays  . Probiotic Product (ALIGN) 4 MG CAPS Take 4 mg by mouth daily.  . simvastatin (ZOCOR) 20 MG tablet Take 20 mg by mouth daily.   No facility-administered encounter medications on file as of 12/31/2016.     Review of Systems  Constitutional: Negative for appetite change, chills, diaphoresis and fever.       Feeds himself but tray has to be set up  HENT: Negative for congestion, mouth sores, sinus pain, sinus pressure, sore throat and trouble swallowing.   Eyes:       Has glaucoma and uses his drops  Respiratory: Positive for cough. Negative for shortness of breath and wheezing.   Cardiovascular: Negative for chest pain, palpitations and leg swelling.  Gastrointestinal: Negative for abdominal pain, constipation, nausea and vomiting.       Last bowel movement was yesterday.   Genitourinary: Negative for dysuria and hematuria.       Has UI  Musculoskeletal: Positive for back pain and gait problem.  Skin: Negative for wound.  Neurological: Negative for dizziness, seizures and headaches.    Psychiatric/Behavioral: Positive for confusion and dysphoric mood. Negative for behavioral problems.    Vitals:   12/31/16 1034  BP: (!) 141/91  Pulse: 95  Resp: 18  Temp: 98.1 F (36.7 C)  TempSrc: Oral  SpO2: 92%  Weight: 175 lb 9.6 oz (79.7 kg)  Height: 5\' 11"  (1.803 m)   Body mass index is 24.49 kg/m.   Wt Readings from Last 3 Encounters:  12/31/16 175 lb 9.6 oz (79.7 kg)  12/25/16 181 lb 4.8 oz (82.2 kg)  12/14/16 179 lb (81.2 kg)   Physical Exam  Constitutional: He appears well-developed and well-nourished. No distress.  HENT:  Head: Normocephalic and atraumatic.  Mouth/Throat: Oropharynx is clear and moist.  Eyes: Pupils are equal, round, and reactive to light. Conjunctivae and EOM are normal. Right eye exhibits no discharge. Left eye exhibits no discharge.  Neck: Neck supple. No thyromegaly present.  Cardiovascular: Normal rate and regular rhythm.   Pulmonary/Chest: Effort normal. No respiratory distress. He has no wheezes. He has no rales. He exhibits no tenderness.  Decreased air entry to lung bases  Abdominal: Soft. Bowel sounds are normal. There is no tenderness. There is no guarding.  Musculoskeletal: He exhibits deformity. He exhibits no edema.  Good range of motion with both shoulders, gets around with wheelchair, unsteady gait with weakness to his legs, hoyer lift or 2 person assistance with transfer  Lymphadenopathy:    He has no cervical adenopathy.  Neurological: He is alert.  Oriented to self and place only.   Skin: Skin is warm and dry. He is not diaphoretic.  Psychiatric:  Pleasantly confused this visit.     Labs reviewed: Basic Metabolic Panel:  Recent Labs  11/28/16 2315 11/29/16 0232 11/30/16 0429 12/04/16 12/18/16 1503  NA  --  138 140 140 142  K  --  3.3* 3.7 4.0 4.6  CL  --  103 106  --  102  CO2  --  28 27  --  21  GLUCOSE  --  184* 135*  --  141*  BUN  --  15 20 15 15   CREATININE 0.94 1.11 0.99 1.0 1.04  CALCIUM  --  8.7*  8.5*  --  9.0  MG 1.8  --   --   --   --  Liver Function Tests:  Recent Labs  03/16/16 1201 06/23/16 0430 06/26/16 09/25/16 11/28/16 1129  AST 22 18 17 19  60*  ALT 27 23 20 27  37  ALKPHOS 77 66 59 75 73  BILITOT 0.9 0.7  --   --  1.5*  PROT 7.0 5.8*  --   --  5.9*  ALBUMIN 4.0 3.2*  --   --  3.1*    Recent Labs  03/16/16 1201  LIPASE 25   No results for input(s): AMMONIA in the last 8760 hours. CBC:  Recent Labs  06/22/16 1057  11/28/16 1129 11/28/16 2315 11/30/16 0429 12/04/16  WBC 18.7*  < > 17.1* 11.2* 9.8 9.0  NEUTROABS 16.0*  --  14.9*  --   --   --   HGB 15.0  < > 14.0 13.8 12.7* 13.7  HCT 43.9  < > 40.4 40.3 37.5* 40*  MCV 96.9  < > 94.4 94.4 95.7  --   PLT 130*  < > 145* 155 128* 165  < > = values in this interval not displayed. Cardiac Enzymes:  Recent Labs  11/28/16 2315 11/29/16 0232 11/29/16 0834  TROPONINI 0.17* 0.15* 0.09*   BNP: Invalid input(s): POCBNP Lab Results  Component Value Date   HGBA1C 6.7 09/25/2016   Lab Results  Component Value Date   TSH 2.40 06/26/2016   No results found for: VITAMINB12 No results found for: FOLATE No results found for: IRON, TIBC, FERRITIN  Imaging and Procedures obtained prior to SNF admission: Ct Angio Chest Pe W/cm &/or Wo Cm  Result Date: 11/28/2016 CLINICAL DATA:  Altered mentation, increasing dyspnea, cough and congestion for the past week, positive D-dimer. EXAM: CT ANGIOGRAPHY CHEST WITH CONTRAST TECHNIQUE: Multidetector CT imaging of the chest was performed using the standard protocol during bolus administration of intravenous contrast. Multiplanar CT image reconstructions and MIPs were obtained to evaluate the vascular anatomy. CONTRAST:  100 cc Isovue 370 IV COMPARISON:  11/28/2016 CXR FINDINGS: Cardiovascular: The study is of quality for the evaluation of pulmonary embolism. There are no filling defects in the central, lobar, and segmental pulmonary arteries to suggest acute pulmonary  embolism. Great vessels are normal in course and caliber. Normal heart size. No significant pericardial fluid/thickening. Minimal aortic atherosclerosis without aneurysm. Mediastinum/Nodes: No discrete thyroid nodules. Unremarkable esophagus. No pathologically enlarged axillary, mediastinal or hilar lymph nodes. Lungs/Pleura: No pneumothorax. No pleural effusion. Small to moderate bilateral pleural effusions with bibasilar compressive and subsegmental atelectasis. Dependent atelectasis is noted along the posterior aspects of both upper lobes. Upper abdomen: Unremarkable. Musculoskeletal: No aggressive appearing focal osseous lesions. There is spondylosis of the included cervical and thoracic spine. No osseous appearing abnormality. Review of the MIP images confirms the above findings. IMPRESSION: 1. Small to moderate bilateral pleural effusions with adjacent atelectasis. 2. No acute pulmonary embolus. 3. Minimal aortic atherosclerosis without aneurysm. Aortic Atherosclerosis (ICD10-I70.0). Electronically Signed   By: Ashley Royalty M.D.   On: 11/28/2016 13:55   Dg Chest Port 1 View  Result Date: 11/28/2016 CLINICAL DATA:  Altered mental status since last night, increased shortness of breath, cough, and congestion for past week, history dementia EXAM: PORTABLE CHEST 1 VIEW COMPARISON:  Portable exam 1145 hours compared to 06/22/2016 FINDINGS: Borderline enlargement of cardiac silhouette. Mediastinal contours and pulmonary vascularity normal. Low lung volumes with bibasilar atelectasis. Remaining lungs clear. No definite pleural effusion or pneumothorax. IMPRESSION: Decreased lung volumes with bibasilar atelectasis. Electronically Signed   By: Lavonia Dana M.D.   On: 11/28/2016  12:04    Assessment/Plan  Chronic combined CHF Appears euvolemic. denies dyspnea or chest pain. continue lisinopril, carvedilol and furosemide with kcl. Check BMP.   Type 2 DM with ckd Currently on metformin 500 mg daily. Check a1c.  Goal <7. Check cbg daily for now Lab Results  Component Value Date   HGBA1C 6.7 09/25/2016    Alzheimer's dementia Continue memantine and donepezil. Provide supportive care. MMSE 18/30 on 12/07/16  Dysphagia Oropharyngeal with dementia. Continue nectar thick liquid and meds to be crushed. Aspiration precautions  Chronic low back pain Denies pain this visit, continue tylenol 500 mg q8h prn pain. Takes vit d supplement  UI With OAB. Continue incontinence care with myrbetriq.   Hyperlipidemia Lipid Panel     Component Value Date/Time   CHOL 135 12/27/2016   TRIG 72 12/27/2016   HDL 53 08/16/2016   LDLCALC 72 12/27/2016   LDL close to goal. Continue simvastatin for now.  Chronic depression Continue his wellbutrin 100 mg daily. Consider GDR next visit.   External hemorrhoids Continue hydrocortisone rectal cream daily as needed.     Family/ staff Communication: reviewed care plan with patient and charge nurse.    Labs/tests ordered: a1c.   [name], MD Internal Medicine [facility] [address] [place], [place] [address] Cell Phone (Monday-Friday 8 am - 5 pm): [phone] On Call: [phone] and follow prompts after 5 pm and on weekends Office Phone: [phone] Office Fax: [phone]

## 2017-01-03 LAB — HEMOGLOBIN A1C: Hemoglobin A1C: 7

## 2017-01-07 ENCOUNTER — Other Ambulatory Visit: Payer: Self-pay | Admitting: *Deleted

## 2017-01-31 ENCOUNTER — Non-Acute Institutional Stay (SKILLED_NURSING_FACILITY): Payer: Medicare Other | Admitting: Family

## 2017-01-31 ENCOUNTER — Encounter: Payer: Self-pay | Admitting: Family

## 2017-01-31 ENCOUNTER — Encounter: Payer: Self-pay | Admitting: *Deleted

## 2017-01-31 DIAGNOSIS — I5042 Chronic combined systolic (congestive) and diastolic (congestive) heart failure: Secondary | ICD-10-CM

## 2017-01-31 DIAGNOSIS — R1312 Dysphagia, oropharyngeal phase: Secondary | ICD-10-CM | POA: Diagnosis not present

## 2017-01-31 DIAGNOSIS — E1122 Type 2 diabetes mellitus with diabetic chronic kidney disease: Secondary | ICD-10-CM | POA: Diagnosis not present

## 2017-01-31 DIAGNOSIS — E785 Hyperlipidemia, unspecified: Secondary | ICD-10-CM | POA: Diagnosis not present

## 2017-01-31 DIAGNOSIS — R2681 Unsteadiness on feet: Secondary | ICD-10-CM

## 2017-01-31 DIAGNOSIS — Z23 Encounter for immunization: Secondary | ICD-10-CM

## 2017-01-31 DIAGNOSIS — F339 Major depressive disorder, recurrent, unspecified: Secondary | ICD-10-CM | POA: Diagnosis not present

## 2017-01-31 LAB — LIPID PANEL
Cholesterol: 103 (ref 0–200)
HDL: 49 (ref 35–70)
LDL Cholesterol: 41
Triglycerides: 47 (ref 40–160)

## 2017-01-31 NOTE — Progress Notes (Addendum)
Location:  [place] Room Number: 58 Place of Service:  SNF (31) Provider: Katilynn Sinkler FNP-C   [name], MD  Patient Care Team: [name], MD as PCP - General (Internal Medicine) Merrilee Ancona, [name], NP as Nurse Practitioner (Family Medicine)  Extended Emergency Contact Information Primary Emergency Contact: [name] Address: [place], [place] Phone: [phone] Mobile Phone: [phone] Relation: Spouse Secondary Emergency Contact: [name] [place] of [place] Phone: [phone] Work Phone: [phone] Relation: Daughter  Code Status: Full Code  Goals of care: Advanced Directive information Advanced Directives 01/31/2017  Does Patient Have a Medical Advance Directive? -  Type of Advance Directive Living will;Healthcare Power of Attorney  Does patient want to make changes to medical advance directive? -  Copy of [place] in Chart? No - copy requested  Would patient like information on creating a medical advance directive? -  Pre-existing out of facility DNR order (yellow form or pink MOST form) -     Chief Complaint  Patient presents with  . Medical Management of Chronic Issues    monthly routine visit    HPI:  Pt is a 83 y.o. male seen today [place] for medical management of chronic diseases.He has a  Medical history of CHF, CKD, type 2 DM, Hyperlipidemia,adavnce dementia,Depression, OAB among other conditions. He is seen in his room today.He denies any acute issues this visit. Facility Nurse reports no new concerns.He continues to work with PT/OT and speech therapy.His swallowing has improved now upgraded from thickned liquids to thin liquids.He has had no recent fall episodes or weight changes.         Past Medical History:  Diagnosis Date  . Abnormality of gait   . Backache, unspecified   . Colon polyps    adenomatous  .  Dementia 11/12/2012  . Diverticulosis   . Essential and other specified forms of tremor   . Hemorrhoids   . History of bilateral hip replacements 11/12/2012  . Memory loss   . 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 Pain in joint, pelvic region and thigh   . Skin cancer of scalp   . Spinal stenosis, lumbar region, without neurogenic claudication   . Unspecified hereditary and idiopathic peripheral neuropathy    Past Surgical History:  Procedure Laterality Date  . CATARACT EXTRACTION, BILATERAL  2012  . RETINAL LASER PROCEDURE  2012   retinal wrinkle  . SKIN CANCER EXCISION    . TOTAL HIP ARTHROPLASTY Left 2000  . TOTAL HIP ARTHROPLASTY (aka REPLACEMENT) Right 2011    Allergies  Allergen Reactions  . Axona [Bacid]     Unknown per MAR  . Gabapentin     Hallucinations    Allergies as of 01/31/2017      Reactions   Axona [bacid]    Unknown per [facility]   Gabapentin    Hallucinations      Medication List       Accurate as of 01/31/17 12:42 PM. Always use your most recent med list.          acetaminophen 500 MG tablet Commonly known as:  TYLENOL Take 500 mg by mouth every 8 (eight) hours as needed (pain).   ALIGN 4 MG Caps Take 4 mg by mouth daily.   buPROPion 100 MG 12 hr tablet Commonly known as:  WELLBUTRIN SR Take  100 mg by mouth daily.   carvedilol 3.125 MG tablet Commonly known as:  COREG Take 1 tablet (3.125 mg total) by mouth 2 (two) times daily with a meal.   CERAVE Crea Apply 1 application topically daily. Apply to arms, legs and trunk   DELSYM 30 MG/5ML liquid Generic drug:  dextromethorphan Take 60 mg by mouth every 12 (twelve) hours as needed for cough. Take 10 mL   donepezil 23 MG Tabs tablet Commonly known as:  ARICEPT Take 1 tablet (23 mg total) by mouth daily.   dorzolamide-timolol 22.3-6.8 MG/ML ophthalmic solution Commonly known as:  COSOPT Place 1 drop into the left eye 2 (two) times daily.     Flaxseed Oil 1000 MG Caps Take 1,000 mg by mouth daily.   folic acid 623 MCG tablet Commonly known as:  FOLVITE Take 800 mcg by mouth every evening.   furosemide 20 MG tablet Commonly known as:  LASIX Take 1 tablet (20 mg total) by mouth daily.   hydrocortisone cream 1 % Apply 1 application topically 2 (two) times daily.   lisinopril 5 MG tablet Commonly known as:  PRINIVIL,ZESTRIL Take 1 tablet (5 mg total) by mouth daily.   loperamide 2 MG tablet Commonly known as:  IMODIUM A-D Take 2 mg by mouth 4 (four) times daily as needed for diarrhea or loose stools.   memantine 28 MG Cp24 24 hr capsule Commonly known as:  NAMENDA XR Take 1 capsule (28 mg total) by mouth daily.   metFORMIN 500 MG tablet Commonly known as:  GLUCOPHAGE Take 500 mg by mouth daily.   multivitamin tablet Take 1 tablet by mouth daily.   MYRBETRIQ 50 MG Tb24 tablet Generic drug:  mirabegron ER Take 50 mg by mouth daily.   nystatin powder Generic drug:  nystatin Apply topically 2 (two) times daily as needed. Apply to redness and scrotal and sacral area   potassium chloride 10 MEQ CR tablet Commonly known as:  KLOR-CON Take 10 mEq by mouth 2 (two) times a week. On mondays and fridays   PROCTOSOL HC 2.5 % rectal cream Generic drug:  hydrocortisone Place 1 application rectally at bedtime as needed for hemorrhoids or anal itching.   simvastatin 20 MG tablet Commonly known as:  ZOCOR Take 20 mg by mouth daily.   Vitamin D 1000 units capsule Take 1,000 Units by mouth daily.   ZINC OXIDE EX Apply 1 application topically 3 (three) times daily as needed.       Review of Systems  Constitutional: Negative for appetite change, chills, fatigue and fever.  HENT: Negative for congestion, rhinorrhea, sinus pain, sinus pressure, sneezing, sore throat and trouble swallowing.   Eyes: Negative for discharge, redness and visual disturbance.  Respiratory: Negative for cough, chest tightness, shortness of  breath and wheezing.   Cardiovascular: Negative for chest pain, palpitations and leg swelling.  Gastrointestinal: Negative for abdominal distention, abdominal pain, constipation, diarrhea, nausea and vomiting.  Endocrine: Negative for cold intolerance, heat intolerance, polydipsia, polyphagia and polyuria.  Genitourinary: Negative for dysuria, flank pain, hematuria and urgency.       Incontinent   Musculoskeletal: Positive for gait problem. Negative for arthralgias.  Skin: Negative for color change, pallor, rash and wound.  Neurological: Negative for dizziness, seizures, syncope, light-headedness and headaches.  Hematological: Does not bruise/bleed easily.  Psychiatric/Behavioral: Positive for confusion. Negative for agitation and sleep disturbance. The patient is not nervous/anxious.     Immunization History  Administered Date(s) Administered  . Influenza-Unspecified 01/10/2017  .  PPD Test 06/24/2016   Pertinent  Health Maintenance Due  Topic Date Due  . FOOT EXAM  09/28/2017 (Originally 06/06/1940)  . OPHTHALMOLOGY EXAM  09/28/2017 (Originally 06/06/1940)  . HEMOGLOBIN A1C  07/04/2017  . INFLUENZA VACCINE  Completed  . PNA vac Low Risk Adult  Completed   Fall Risk  12/07/2016 01/19/2016 07/21/2015  Falls in the past year? No Yes Yes  Number falls in past yr: - 1 1  Injury with Fall? - No No  Risk for fall due to : - Impaired balance/gait -  Follow up - Falls prevention discussed Falls prevention discussed    Vitals:   01/31/17 1031  BP: 131/83  Pulse: 84  Resp: 20  Temp: 98 F (36.7 C)  Weight: 178 lb 6.4 oz (80.9 kg)  Height: 5\' 11"  (1.803 m)   Body mass index is 24.88 kg/m. Physical Exam  Constitutional: He appears well-developed and well-nourished.  Elderly in no acute distress  HENT:  Head: Normocephalic.  Right Ear: External ear normal.  Left Ear: External ear normal.  Mouth/Throat: Oropharynx is clear and moist. No oropharyngeal exudate.  Eyes: Pupils are equal,  round, and reactive to light. Conjunctivae and EOM are normal. Right eye exhibits no discharge. Left eye exhibits no discharge. No scleral icterus.  Neck: Normal range of motion. No JVD present. No thyromegaly present.  Cardiovascular: Normal rate, regular rhythm and intact distal pulses.  Exam reveals no gallop and no friction rub.   No murmur heard. Pulmonary/Chest: Effort normal and breath sounds normal. No respiratory distress. He has no wheezes. He has no rales. He exhibits no tenderness.  Abdominal: Soft. Bowel sounds are normal. He exhibits no distension and no mass. There is no tenderness. There is no rebound and no guarding.  Genitourinary:  Genitourinary Comments: Incontinent   Musculoskeletal: He exhibits no edema or tenderness.  Unsteady gait uses wheelchair.   Lymphadenopathy:    He has no cervical adenopathy.  Neurological: Coordination normal.  Alert and oriented to person and place but disoriented to time, day,date and year.   Skin: Skin is warm and dry. No rash noted. No erythema. No pallor.  Psychiatric: He has a normal mood and affect.   Labs reviewed:  Recent Labs  11/28/16 2315 11/29/16 0232 11/30/16 0429 12/04/16 12/18/16 1503  NA  --  138 140 140 142  K  --  3.3* 3.7 4.0 4.6  CL  --  103 106  --  102  CO2  --  28 27  --  21  GLUCOSE  --  184* 135*  --  141*  BUN  --  15 20 15 15   CREATININE 0.94 1.11 0.99 1.0 1.04  CALCIUM  --  8.7* 8.5*  --  9.0  MG 1.8  --   --   --   --     Recent Labs  03/16/16 1201 06/23/16 0430 06/26/16 09/25/16 11/28/16 1129  AST 22 18 17 19  60*  ALT 27 23 20 27  37  ALKPHOS 77 66 59 75 73  BILITOT 0.9 0.7  --   --  1.5*  PROT 7.0 5.8*  --   --  5.9*  ALBUMIN 4.0 3.2*  --   --  3.1*    Recent Labs  06/22/16 1057  11/28/16 1129 11/28/16 2315 11/30/16 0429 12/04/16  WBC 18.7*  < > 17.1* 11.2* 9.8 9.0  NEUTROABS 16.0*  --  14.9*  --   --   --   HGB  15.0  < > 14.0 13.8 12.7* 13.7  HCT 43.9  < > 40.4 40.3 37.5* 40*    MCV 96.9  < > 94.4 94.4 95.7  --   PLT 130*  < > 145* 155 128* 165  < > = values in this interval not displayed. Lab Results  Component Value Date   TSH 2.40 06/26/2016   Lab Results  Component Value Date   HGBA1C 7.0 01/03/2017   Lab Results  Component Value Date   CHOL 135 12/27/2016   HDL 53 08/16/2016   LDLCALC 72 12/27/2016   TRIG 72 12/27/2016    Significant Diagnostic Results in last 30 days:  No results found.  Assessment/Plan 1. Type 2 diabetes mellitus with chronic kidney disease, without long-term current use of insulin Lab Results  Component Value Date   HGBA1C 7.0 01/03/2017  CBG log 120's-low 200's.continue on Metformin 500 mg tablet daily. Continue on ACE inhibitor and Statin.Monitor Hgb A1C.   2. Oropharyngeal dysphagia Has improved diet upgraded from thick  to thin liquids. Continue to work with Speech Therapy.  3. Unsteady gait He remains high risk for falls. Uses Wheelchair. Continue to work with PT/OT. Fall and safety precautions.  4. Hyperlipidemia LDL goal <70 LDL at goal.  Lab Results  Component Value Date   CHOL 103 01/31/2017   HDL 49 01/31/2017   LDLCALC 41 01/31/2017   TRIG 47 01/31/2017  Reduce simvastatin to10 mg Tablet daily. Recheck  lipid panel in 3 months.   5. Major depression, recurrent, chronic Stable. Will continue on wellbutrin 100 mg tablet while working with therapy to promote participation then attempt GDR. Continue to monitor for mood changes.   6. Chronic combined systolic and diastolic congestive heart failure No abrupt weight gain.Lungs CTA.continue on coreg 3.125 mg tablet twice daily, lisinopril 5 mg tablet daily and Furosemide 20 mg tablet daily. Continue to monitor weight.  7. Need for Zoster vaccine  Patient's POA request shingrex vaccine aware that medication back order nationwide. Administer Shingrex 0.5 mls I.M x 1 dose then repeat next dose in 2 months.Give vaccine whenever it's available.    Family/ staff  Communication: Reviewed plan of care with patient and facility Nurse.   Labs/tests ordered: None   Aamirah Salmi C Yashua Bracco, NP

## 2017-02-12 ENCOUNTER — Encounter: Payer: Self-pay | Admitting: Podiatry

## 2017-02-12 ENCOUNTER — Ambulatory Visit: Payer: Medicare Other | Admitting: Podiatry

## 2017-02-12 DIAGNOSIS — B351 Tinea unguium: Secondary | ICD-10-CM

## 2017-02-12 DIAGNOSIS — E1159 Type 2 diabetes mellitus with other circulatory complications: Secondary | ICD-10-CM

## 2017-02-12 DIAGNOSIS — I739 Peripheral vascular disease, unspecified: Secondary | ICD-10-CM

## 2017-02-12 DIAGNOSIS — I878 Other specified disorders of veins: Secondary | ICD-10-CM

## 2017-02-12 DIAGNOSIS — M79676 Pain in unspecified toe(s): Secondary | ICD-10-CM

## 2017-02-12 NOTE — Progress Notes (Signed)
Patient ID: Jose [name], male   DOB: [birth year], 83 y.o.   MRN: [id number] Complaint:  Visit Type: Patient returns to my office for continued preventative foot care services. Complaint: Patient states" my nails have grown long and thick and become painful to walk and wear shoes" . He presents for preventative foot care services. Patient has fallen and now wears compression socks.  Podiatric Exam: Vascular: dorsalis pedis and posterior tibial pulses are not  palpable bilateral. Capillary return is diminished. Temperature gradient is diminished. Skin turgor  diminished .  Purplish discoloration feet  B/LSensorium: Normal Semmes Weinstein monofilament test. Normal tactile sensation bilaterally. Nail Exam: Pt has thick disfigured discolored nails with subungual debris noted bilateral entire nail hallux through fifth toenails Ulcer Exam: There is no evidence of ulcer or pre-ulcerative changes or infection. Orthopedic Exam: Muscle tone and strength are WNL. No limitations in general ROM. No crepitus or effusions noted. Foot type and digits show no abnormalities. Bony prominences are unremarkable. Skin: No Porokeratosis. No infection or ulcers  Diagnosis:  Tinea unguium, Pain in right toe, pain in left toes, PVD  Treatment & Plan Procedures and Treatment: Consent by patient was obtained for treatment procedures. The patient understood the discussion of treatment and procedures well. All questions were answered thoroughly reviewed. Debridement of mycotic and hypertrophic toenails, 1 through 5 bilateral and clearing of subungual debris. No ulceration, no infection noted.  Return Visit-Office Procedure: Patient instructed to return to the office for a follow up visit 3 months for continued evaluation and treatment.  Gardiner Barefoot DPM

## 2017-02-13 ENCOUNTER — Encounter: Payer: Self-pay | Admitting: Internal Medicine

## 2017-02-13 ENCOUNTER — Non-Acute Institutional Stay (SKILLED_NURSING_FACILITY): Payer: Medicare Other | Admitting: Internal Medicine

## 2017-02-13 DIAGNOSIS — F0280 Dementia in other diseases classified elsewhere without behavioral disturbance: Secondary | ICD-10-CM

## 2017-02-13 DIAGNOSIS — F325 Major depressive disorder, single episode, in full remission: Secondary | ICD-10-CM

## 2017-02-13 DIAGNOSIS — G309 Alzheimer's disease, unspecified: Secondary | ICD-10-CM

## 2017-02-13 NOTE — Progress Notes (Signed)
Location:  [place] Room Number: 65 Place of Service:  SNF [id number]) Provider:  [name], MD  [name], MD  Patient Care Team: [name], MD as PCP - General (Internal Medicine) [name], [name], NP as Nurse Practitioner (Family Medicine)  Extended Emergency Contact Information Primary Emergency Contact: [name] Address: [place], [place] of [place] Phone: [phone] Mobile Phone: [phone] Relation: Spouse Secondary Emergency Contact: [name] [place] of [place] Phone: [phone] Work Phone: [phone] Relation: Daughter  Code status: full code  Goals of care: Advanced Directive information Advanced Directives 02/13/2017  Does Patient Have a Medical Advance Directive? Yes  Type of Paramedic of [place];Living will  Does patient want to make changes to medical advance directive? No - Patient declined  Copy of [place] in Chart? Yes  Would patient like information on creating a medical advance directive? -  Pre-existing out of facility DNR order (yellow form or pink MOST form) -     Chief Complaint  Patient presents with  . Acute Visit    follow up on mood and sleep    HPI:  Pt is a 83 y.o. male seen today for an acute visit for medication management. He has dementia with behavioral disturbance. He is currently on bupropion 100 mg daily. His mood has been stable per nursing. No acute behavior change reported.    Past Medical History:  Diagnosis Date  . Abnormality of gait   . Backache, unspecified   . Colon polyps    adenomatous  . Dementia 11/12/2012  . Diverticulosis   . Essential and other specified forms of tremor   . Hemorrhoids   . History of bilateral hip replacements 11/12/2012  . Memory loss   . 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 Pain in joint, pelvic region and  thigh   . Skin cancer of scalp   . Spinal stenosis, lumbar region, without neurogenic claudication   . Unspecified hereditary and idiopathic peripheral neuropathy    Past Surgical History:  Procedure Laterality Date  . CATARACT EXTRACTION, BILATERAL  2012  . RETINAL LASER PROCEDURE  2012   retinal wrinkle  . SKIN CANCER EXCISION    . TOTAL HIP ARTHROPLASTY Left 2000  . TOTAL HIP ARTHROPLASTY (aka REPLACEMENT) Right 2011    Allergies  Allergen Reactions  . Axona [Bacid]     Unknown per MAR  . Gabapentin     Hallucinations    Outpatient Encounter Medications as of 02/13/2017  Medication Sig  . acetaminophen (TYLENOL) 500 MG tablet Take 500 mg by mouth every 8 (eight) hours as needed (pain).   Marland Kitchen buPROPion (WELLBUTRIN SR) 100 MG 12 hr tablet Take 100 mg by mouth daily.  . carvedilol (COREG) 3.125 MG tablet Take 1 tablet (3.125 mg total) by mouth 2 (two) times daily with a meal.  . Cholecalciferol (VITAMIN D) 1000 UNITS capsule Take 1,000 Units by mouth daily.    Marland Kitchen dextromethorphan (DELSYM) 30 MG/5ML liquid Take 60 mg by mouth every 12 (twelve) hours as needed for cough. Take 10 mL  . donepezil (ARICEPT) 23 MG TABS tablet Take 1 tablet (23 mg total) by mouth daily.  . dorzolamide-timolol (COSOPT) 22.3-6.8 MG/ML ophthalmic solution Place 1 drop into the left eye 2 (two) times daily.   . Emollient (CERAVE) CREA Apply 1 application topically daily. Apply to  arms, legs and trunk  . Flaxseed, Linseed, (FLAXSEED OIL) 1000 MG CAPS Take 1,000 mg by mouth daily.    . folic acid (FOLVITE) 254 MCG tablet Take 800 mcg by mouth every evening.   . furosemide (LASIX) 20 MG tablet Take 1 tablet (20 mg total) by mouth daily.  . hydrocortisone (PROCTOSOL HC) 2.5 % rectal cream Place 1 application rectally at bedtime as needed for hemorrhoids or anal itching.  . hydrocortisone cream 1 % Apply 1 application topically 2 (two) times daily.  Marland Kitchen lisinopril (PRINIVIL,ZESTRIL) 5 MG tablet Take 1 tablet (5 mg  total) by mouth daily.  Marland Kitchen loperamide (IMODIUM A-D) 2 MG tablet Take 2 mg by mouth 4 (four) times daily as needed for diarrhea or loose stools.   . memantine (NAMENDA XR) 28 MG CP24 24 hr capsule Take 1 capsule (28 mg total) by mouth daily.  . metFORMIN (GLUCOPHAGE) 500 MG tablet Take 500 mg by mouth daily.   . mirabegron ER (MYRBETRIQ) 50 MG TB24 tablet Take 50 mg by mouth daily.  . Multiple Vitamin (MULTIVITAMIN) tablet Take 1 tablet by mouth daily.    Marland Kitchen nystatin (NYSTATIN) powder Apply topically 2 (two) times daily as needed. Apply to redness and scrotal and sacral area  . potassium chloride (KLOR-CON) 10 MEQ CR tablet Take 10 mEq by mouth 2 (two) times a week. On mondays and fridays  . Probiotic Product (ALIGN) 4 MG CAPS Take 4 mg by mouth daily.  . simvastatin (ZOCOR) 10 MG tablet Take 10 mg daily by mouth.  Marland Kitchen ZINC OXIDE EX Apply 1 application 3 (three) times daily topically.   . [DISCONTINUED] simvastatin (ZOCOR) 20 MG tablet Take 20 mg by mouth daily.   No facility-administered encounter medications on file as of 02/13/2017.     Review of Systems  Unable to perform ROS: Dementia (limited participation )  Constitutional: Negative for appetite change and fever.  Respiratory: Negative for shortness of breath.   Cardiovascular: Negative for chest pain.  Neurological: Negative for dizziness.  Psychiatric/Behavioral: Positive for confusion. Negative for agitation, behavioral problems and sleep disturbance.    Immunization History  Administered Date(s) Administered  . Influenza-Unspecified 01/10/2017  . PPD Test 06/24/2016   Pertinent  Health Maintenance Due  Topic Date Due  . FOOT EXAM  09/28/2017 (Originally 06/06/1940)  . OPHTHALMOLOGY EXAM  09/28/2017 (Originally 06/06/1940)  . HEMOGLOBIN A1C  07/04/2017  . INFLUENZA VACCINE  Completed  . PNA vac Low Risk Adult  Completed   Fall Risk  12/07/2016 01/19/2016 07/21/2015  Falls in the past year? No Yes Yes  Number falls in past yr: -  1 1  Injury with Fall? - No No  Risk for fall due to : - Impaired balance/gait -  Follow up - Falls prevention discussed Falls prevention discussed   Functional Status Survey:    Vitals:   02/13/17 1107  BP: 112/79  Pulse: 93  Resp: 20  Temp: (!) 97 F (36.1 C)  TempSrc: Oral  SpO2: 97%  Weight: 183 lb 6.4 oz (83.2 kg)  Height: 5\' 11"  (1.803 m)   Body mass index is 25.58 kg/m. Physical Exam  Constitutional: He appears well-developed and well-nourished.  HENT:  Head: Normocephalic and atraumatic.  Eyes: Pupils are equal, round, and reactive to light.  Neck: Neck supple.  Cardiovascular: Normal rate and regular rhythm.  Pulmonary/Chest: Effort normal and breath sounds normal.  Abdominal: Soft. Bowel sounds are normal.  Musculoskeletal:  Weakness of lower extremities, wheelchair bound  Lymphadenopathy:    He has no cervical adenopathy.  Neurological: He is alert.  Oriented only to self  Skin: Skin is warm and dry.  Psychiatric:  Calm this visit, pleasantly confused    Labs reviewed: Recent Labs    11/28/16 2315 11/29/16 0232 11/30/16 0429 12/04/16 12/18/16 1503  NA  --  138 140 140 142  K  --  3.3* 3.7 4.0 4.6  CL  --  103 106  --  102  CO2  --  28 27  --  21  GLUCOSE  --  184* 135*  --  141*  BUN  --  15 20 15 15   CREATININE 0.94 1.11 0.99 1.0 1.04  CALCIUM  --  8.7* 8.5*  --  9.0  MG 1.8  --   --   --   --    Recent Labs    03/16/16 1201 06/23/16 0430 06/26/16 09/25/16 11/28/16 1129  AST 22 18 17 19  60*  ALT 27 23 20 27  37  ALKPHOS 77 66 59 75 73  BILITOT 0.9 0.7  --   --  1.5*  PROT 7.0 5.8*  --   --  5.9*  ALBUMIN 4.0 3.2*  --   --  3.1*   Recent Labs    06/22/16 1057  11/28/16 1129 11/28/16 2315 11/30/16 0429 12/04/16  WBC 18.7*   < > 17.1* 11.2* 9.8 9.0  NEUTROABS 16.0*  --  14.9*  --   --   --   HGB 15.0   < > 14.0 13.8 12.7* 13.7  HCT 43.9   < > 40.4 40.3 37.5* 40*  MCV 96.9   < > 94.4 94.4 95.7  --   PLT 130*   < > 145* 155 128*  165   < > = values in this interval not displayed.   Lab Results  Component Value Date   TSH 2.40 06/26/2016   Lab Results  Component Value Date   HGBA1C 7.0 01/03/2017   Lab Results  Component Value Date   CHOL 103 01/31/2017   HDL 49 01/31/2017   LDLCALC 41 01/31/2017   TRIG 47 01/31/2017    Significant Diagnostic Results in last 30 days:  No results found.  Assessment/Plan  major depression In remission. Mood appears stable overall. Has been on bupropion 100 mg daily for long term. Attempt GDR with changing bupropion to 100 mg every other day x 2 weeks and stop.  Alzheimer's dementia without behavioral disturbance Controlled mood. No outbursts reported. Continue namenda and aricept. Wean off bupropion as above.    Family/ staff Communication: reviewed care plan with patient and charge nurse.   Labs/tests ordered:  none  [name], MD Internal Medicine [facility] [address] [place], [place] [address] Cell Phone (Monday-Friday 8 am - 5 pm): [phone] On Call: [phone] and follow prompts after 5 pm and on weekends Office Phone: [phone] Office Fax: [phone]

## 2017-03-04 ENCOUNTER — Ambulatory Visit: Payer: Medicare Other | Admitting: Nurse Practitioner

## 2017-03-04 ENCOUNTER — Encounter: Payer: Self-pay | Admitting: Nurse Practitioner

## 2017-03-04 VITALS — BP 106/78 | HR 84 | Ht 71.0 in | Wt 177.0 lb

## 2017-03-04 DIAGNOSIS — I5022 Chronic systolic (congestive) heart failure: Secondary | ICD-10-CM | POA: Diagnosis not present

## 2017-03-04 NOTE — Patient Instructions (Addendum)
We will be checking the following labs today - BMET, CBC, BNP   Medication Instructions:    Continue with your current medicines.     Testing/Procedures To Be Arranged:  N/A  Follow-Up:   See Dr. Johnsie Cancel 3 to 4 months    Other Special Instructions:   N/A    If you need a refill on your cardiac medications before your next appointment, please call your pharmacy.   Call the [place] office at [phone] if you have any questions, problems or concerns.

## 2017-03-04 NOTE — Progress Notes (Signed)
CARDIOLOGY OFFICE NOTE  Date:  03/04/2017    Jose [name] Date of Birth: [birth year] Medical Record #[id number]  PCP:  [name], MD  Cardiologist:  Gillian Shields   Chief Complaint  Patient presents with  . Congestive Heart Failure    Follow up visit - seen for Dr. Johnsie Cancel    History of Present Illness: Jose [name] is a 83 y.o. male who presents today for a follow up visit. Seen for Dr. Johnsie Cancel.   He has a history of spinal stenosis, arthritis, dementia and DM.   Presented to the hospital in August with cough and decreased sats on RA. EMS placed on CPAP. Evaluated for heart failure. Found to have EF of 30 to 35%. Started on beta blocker and ACE. Troponin was elevated - felt to be demand ischemia. He diuresed well. No further cardiac testing felt to be warranted.   I then saw him back for his post hospital visit - was doing ok - wife provided most of the history - he was back in health care at [facility] (due to a prior fall). Was being worked up for aspiration. Diet controlled. Overall seemed to be stable from our standpoint.   Comes in today. Here with his wife. She gives most of the history. Doing ok. Still getting PT. Not doing that much walking. Has had some swelling in his feet - probably from sitting with his legs down all day. He was advised to walk more and this has helped. Still with lots of balance issues. Cough has improved. Has been able to get off of "thick it". Little runny nose - nothing excessive. No chest pain. Still with memory impairment. Overall, seems to be holding his own. BP remains soft.   Past Medical History:  Diagnosis Date  . Abnormality of gait   . Backache, unspecified   . Colon polyps    adenomatous  . Dementia 11/12/2012  . Diverticulosis   . Essential and other specified forms of tremor   . Hemorrhoids   . History of bilateral hip replacements 11/12/2012  . Memory loss   . Other persistent mental disorders due to conditions  classified elsewhere   . Pain in joint, pelvic region and thigh   . Skin cancer of scalp   . Spinal stenosis, lumbar region, without neurogenic claudication   . Unspecified hereditary and idiopathic peripheral neuropathy     Past Surgical History:  Procedure Laterality Date  . CATARACT EXTRACTION, BILATERAL  2012  . RETINAL LASER PROCEDURE  2012   retinal wrinkle  . SKIN CANCER EXCISION    . TOTAL HIP ARTHROPLASTY Left 2000  . TOTAL HIP ARTHROPLASTY (aka REPLACEMENT) Right 2011     Medications: Current Meds  Medication Sig  . acetaminophen (TYLENOL) 500 MG tablet Take 500 mg by mouth every 8 (eight) hours as needed (pain).   Marland Kitchen buPROPion (WELLBUTRIN SR) 100 MG 12 hr tablet Take 100 mg by mouth daily.  . carvedilol (COREG) 3.125 MG tablet Take 1 tablet (3.125 mg total) by mouth 2 (two) times daily with a meal.  . Cholecalciferol (VITAMIN D) 1000 UNITS capsule Take 1,000 Units by mouth daily.    Marland Kitchen dextromethorphan (DELSYM) 30 MG/5ML liquid Take 60 mg by mouth every 12 (twelve) hours as needed for cough. Take 10 mL  . donepezil (ARICEPT) 23 MG TABS tablet Take 1 tablet (23 mg total) by mouth daily.  . dorzolamide-timolol (COSOPT) 22.3-6.8 MG/ML ophthalmic solution Place 1 drop  into the left eye 2 (two) times daily.   . Emollient (CERAVE) CREA Apply 1 application topically daily. Apply to arms, legs and trunk  . Flaxseed, Linseed, (FLAXSEED OIL) 1000 MG CAPS Take 1,000 mg by mouth daily.    . folic acid (FOLVITE) 937 MCG tablet Take 800 mcg by mouth every evening.   . furosemide (LASIX) 20 MG tablet Take 1 tablet (20 mg total) by mouth daily.  . hydrocortisone (PROCTOSOL HC) 2.5 % rectal cream Place 1 application rectally at bedtime as needed for hemorrhoids or anal itching.  . hydrocortisone cream 1 % Apply 1 application topically 2 (two) times daily.  Marland Kitchen lisinopril (PRINIVIL,ZESTRIL) 5 MG tablet Take 1 tablet (5 mg total) by mouth daily.  Marland Kitchen loperamide (IMODIUM A-D) 2 MG tablet Take 2  mg by mouth 4 (four) times daily as needed for diarrhea or loose stools.   . memantine (NAMENDA XR) 28 MG CP24 24 hr capsule Take 1 capsule (28 mg total) by mouth daily.  . metFORMIN (GLUCOPHAGE) 500 MG tablet Take 500 mg by mouth daily.   . mirabegron ER (MYRBETRIQ) 50 MG TB24 tablet Take 50 mg by mouth daily.  . Multiple Vitamin (MULTIVITAMIN) tablet Take 1 tablet by mouth daily.    Marland Kitchen nystatin (NYSTATIN) powder Apply topically 2 (two) times daily as needed. Apply to redness and scrotal and sacral area  . potassium chloride (KLOR-CON) 10 MEQ CR tablet Take 10 mEq by mouth 2 (two) times a week. On mondays and fridays  . Probiotic Product (ALIGN) 4 MG CAPS Take 4 mg by mouth daily.  . simvastatin (ZOCOR) 10 MG tablet Take 10 mg daily by mouth.  Marland Kitchen ZINC OXIDE EX Apply 1 application 3 (three) times daily topically.      Allergies: Allergies  Allergen Reactions  . Axona [Bacid]     Unknown per MAR  . Gabapentin     Hallucinations    Social History: The patient  reports that he has quit smoking. His smoking use included cigarettes. he has never used smokeless tobacco. He reports that he drinks about 8.4 oz of alcohol per week. He reports that he does not use drugs.   Family History: The patient's family history includes Heart failure in his mother.   Review of Systems: Please see the history of present illness.   Otherwise, the review of systems is positive for none.   All other systems are reviewed and negative.   Physical Exam: VS:  BP 106/78   Pulse 84   Ht 5\' 11"  (1.803 m)   Wt 177 lb (80.3 kg)   BMI 24.69 kg/m  .  BMI Body mass index is 24.69 kg/m.  Wt Readings from Last 3 Encounters:  03/04/17 177 lb (80.3 kg)  02/13/17 183 lb 6.4 oz (83.2 kg)  01/31/17 178 lb 6.4 oz (80.9 kg)    General: Pleasant. Elderly male who is alert and in no acute distress.  He is in a wheelchair.  HEENT: Normal.  Neck: Supple, no JVD, carotid bruits, or masses noted.  Cardiac: Regular rate  and rhythm. No murmurs, rubs, or gallops. No significant edema.  Respiratory:  Lungs are clear to auscultation bilaterally with normal work of breathing.  GI: Soft and nontender.  MS: No deformity or atrophy. Gait and ROM intact.  Skin: Warm and dry. Color is normal.  Neuro:  Strength and sensation are intact and no gross focal deficits noted.  Psych: Alert, appropriate and with normal affect.   LABORATORY  DATA:  EKG:  EKG is not ordered today.  Lab Results  Component Value Date   WBC 9.0 12/04/2016   HGB 13.7 12/04/2016   HCT 40 (A) 12/04/2016   PLT 165 12/04/2016   GLUCOSE 141 (H) 12/18/2016   CHOL 103 01/31/2017   TRIG 47 01/31/2017   HDL 49 01/31/2017   LDLCALC 41 01/31/2017   ALT 37 11/28/2016   AST 60 (H) 11/28/2016   NA 142 12/18/2016   K 4.6 12/18/2016   CL 102 12/18/2016   CREATININE 1.04 12/18/2016   BUN 15 12/18/2016   CO2 21 12/18/2016   TSH 2.40 06/26/2016   INR 1.00 12/13/2009   HGBA1C 7.0 01/03/2017     BNP (last 3 results) Recent Labs    11/28/16 1129  BNP 994.3*    ProBNP (last 3 results) Recent Labs    12/18/16 1503  PROBNP 1,931*     Other Studies Reviewed Today:  Echo Study Conclusions 10/2016  - Left ventricle: The cavity size was normal. There was moderate concentric hypertrophy. Systolic function was moderately to severely reduced. The estimated ejection fraction was in the range of 30% to 35%. Diffuse hypokinesis worse in the inferoseptal myocardium. Features are consistent with a pseudonormal left ventricular filling pattern, with concomitant abnormal relaxation and increased filling pressure (grade 2 diastolic dysfunction). Doppler parameters are consistent with high ventricular filling pressure. - Aortic valve: Transvalvular velocity was within the normal range. There was no stenosis. There was mild regurgitation. Valve area (Vmax): 1.72 cm^2. - Mitral valve: Mildly calcified annulus. Transvalvular  velocity was within the normal range. There was no evidence for stenosis. There was no regurgitation. - Left atrium: The atrium was moderately dilated. - Right ventricle: The cavity size was normal. Wall thickness was normal. Systolic function was normal. - Right atrium: The atrium was moderately to severely dilated. - Tricuspid valve: There was no regurgitation. - Pulmonary arteries: PA peak pressure: 12 mm Hg (S).   Assessment/Plan:  1. Chronic systolic HF - admitted back in the fall - EF is 30 to 35% - managed medically. BP remains soft - he is on just low dose ACE and beta blocker - no real room to titrate further. He looks better today. No changes made today. Will get his lab checked - favor conservative management.   2. Dementia  3. ?aspiration - wife says improved.   4. Elevated troponin - felt to be from demand ischemia - no further cardiac testing felt to be warranted. No active chest pain reported. Would favor conservative management.   5. Balance issue - safety is key  Current medicines are reviewed with the patient today.  The patient does not have concerns regarding medicines other than what has been noted above.  The following changes have been made:  See above.  Labs/ tests ordered today include:   No orders of the defined types were placed in this encounter.    Disposition:   FU with Dr. Johnsie Cancel in 3 to 4 months.   Patient is agreeable to this plan and will call if any problems develop in the interim.   SignedTruitt Merle, NP  03/04/2017 3:00 PM  [facility] [address] [place], [place]  [address] Phone: [phone] Fax: [phone]

## 2017-03-05 LAB — BASIC METABOLIC PANEL
BUN/Creatinine Ratio: 19 (ref 10–24)
BUN: 19 mg/dL (ref 8–27)
CO2: 21 mmol/L (ref 20–29)
Calcium: 9.1 mg/dL (ref 8.6–10.2)
Chloride: 108 mmol/L — ABNORMAL HIGH (ref 96–106)
Creatinine, Ser: 1.02 mg/dL (ref 0.76–1.27)
GFR calc Af Amer: 78 mL/min/{1.73_m2} (ref >59)
GFR calc non Af Amer: 67 mL/min/{1.73_m2} (ref >59)
Glucose: 202 mg/dL — ABNORMAL HIGH (ref 65–99)
Potassium: 3.9 mmol/L (ref 3.5–5.2)
Sodium: 144 mmol/L (ref 134–144)

## 2017-03-05 LAB — CBC
Hematocrit: 39.3 % (ref 37.5–51.0)
Hemoglobin: 12.9 g/dL — ABNORMAL LOW (ref 13.0–17.7)
MCH: 32.3 pg (ref 26.6–33.0)
MCHC: 32.8 g/dL (ref 31.5–35.7)
MCV: 99 fL — ABNORMAL HIGH (ref 79–97)
Platelets: 151 10*3/uL (ref 150–379)
RBC: 3.99 x10E6/uL — ABNORMAL LOW (ref 4.14–5.80)
RDW: 13.8 % (ref 12.3–15.4)
WBC: 10 10*3/uL (ref 3.4–10.8)

## 2017-03-05 LAB — PRO B NATRIURETIC PEPTIDE: NT-Pro BNP: 1249 pg/mL — ABNORMAL HIGH (ref 0–486)

## 2017-03-06 ENCOUNTER — Non-Acute Institutional Stay (SKILLED_NURSING_FACILITY): Payer: Medicare Other | Admitting: Internal Medicine

## 2017-03-06 ENCOUNTER — Encounter: Payer: Self-pay | Admitting: Internal Medicine

## 2017-03-06 DIAGNOSIS — E785 Hyperlipidemia, unspecified: Secondary | ICD-10-CM

## 2017-03-06 DIAGNOSIS — E440 Moderate protein-calorie malnutrition: Secondary | ICD-10-CM | POA: Diagnosis not present

## 2017-03-06 DIAGNOSIS — E1122 Type 2 diabetes mellitus with diabetic chronic kidney disease: Secondary | ICD-10-CM

## 2017-03-06 DIAGNOSIS — N3281 Overactive bladder: Secondary | ICD-10-CM | POA: Diagnosis not present

## 2017-03-06 DIAGNOSIS — R2681 Unsteadiness on feet: Secondary | ICD-10-CM | POA: Diagnosis not present

## 2017-03-06 DIAGNOSIS — E1169 Type 2 diabetes mellitus with other specified complication: Secondary | ICD-10-CM

## 2017-03-06 NOTE — Progress Notes (Signed)
Location:  [place] Room Number: 61 Place of Service:  SNF [id number]) Provider:  [name] MD  [name], MD  Patient Care Team: [name], MD as PCP - General (Internal Medicine) [name], [name], NP as Nurse Practitioner (Family Medicine)  Extended Emergency Contact Information Primary Emergency Contact: [name] Address: [place], [place] Phone: [phone] Mobile Phone: [phone] Relation: Spouse Secondary Emergency Contact: [name] [place] Phone: [phone] Work Phone: [phone] Relation: Daughter  Goals of care: Advanced Directive information Advanced Directives 03/06/2017  Does Patient Have a Medical Advance Directive? Yes  Type of Paramedic of [place];Living will  Does patient want to make changes to medical advance directive? -  Copy of [place] in Chart? Yes  Would patient like information on creating a medical advance directive? -  Pre-existing out of facility DNR order (yellow form or pink MOST form) -     Chief Complaint  Patient presents with  . Medical Management of Chronic Issues    routine visit    HPI:  Pt is a 83 y.o. male seen today for medical management of chronic diseases. He is pleasantly confused. He has to be wheeled on his wheelchair due to poor balance. He denies any concern. No acute behavior changes reported. Attempted GDR of bupropion last visit but family did not want the changes and he is back on it on daily basis. No fall reported. His breathing has been stable. Blood sugar reviewed with all reading between 100-130. No hypoglycemic episodes.    Past Medical History:  Diagnosis Date  . Abnormality of gait   . Backache, unspecified   . Colon polyps    adenomatous  . Dementia 11/12/2012  . Diverticulosis   . Essential and other specified forms of tremor   .  Hemorrhoids   . History of bilateral hip replacements 11/12/2012  . Memory loss   . 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 Pain in joint, pelvic region and thigh   . Skin cancer of scalp   . Spinal stenosis, lumbar region, without neurogenic claudication   . Unspecified hereditary and idiopathic peripheral neuropathy    Past Surgical History:  Procedure Laterality Date  . CATARACT EXTRACTION, BILATERAL  2012  . RETINAL LASER PROCEDURE  2012   retinal wrinkle  . SKIN CANCER EXCISION    . TOTAL HIP ARTHROPLASTY Left 2000  . TOTAL HIP ARTHROPLASTY (aka REPLACEMENT) Right 2011    Allergies  Allergen Reactions  . Axona [Bacid]     Unknown per MAR  . Gabapentin     Hallucinations    Outpatient Encounter Medications as of 03/06/2017  Medication Sig  . acetaminophen (TYLENOL) 500 MG tablet Take 500 mg by mouth every 8 (eight) hours as needed (pain).   Marland Kitchen buPROPion (WELLBUTRIN SR) 100 MG 12 hr tablet Take 100 mg by mouth daily.  . carvedilol (COREG) 3.125 MG tablet Take 1 tablet (3.125 mg total) by mouth 2 (two) times daily with a meal.  . Cholecalciferol (VITAMIN D) 1000 UNITS capsule Take 1,000 Units by mouth daily.    Marland Kitchen dextromethorphan (DELSYM) 30 MG/5ML liquid Take 60 mg by mouth every 12 (twelve) hours as needed for cough. Take 10 mL  . donepezil (ARICEPT) 23 MG TABS tablet Take 1 tablet (23 mg total) by mouth daily.  Marland Kitchen  dorzolamide-timolol (COSOPT) 22.3-6.8 MG/ML ophthalmic solution Place 1 drop into the left eye 2 (two) times daily.   . Emollient (CERAVE) CREA Apply 1 application topically daily. Apply to arms, legs and trunk  . Flaxseed, Linseed, (FLAXSEED OIL) 1000 MG CAPS Take 1,000 mg by mouth daily.    . folic acid (FOLVITE) 784 MCG tablet Take 800 mcg by mouth every evening.   . furosemide (LASIX) 20 MG tablet Take 1 tablet (20 mg total) by mouth daily.  . hydrocortisone (PROCTOSOL HC) 2.5 % rectal cream Place 1 application rectally at  bedtime as needed for hemorrhoids or anal itching.  Marland Kitchen lisinopril (PRINIVIL,ZESTRIL) 5 MG tablet Take 1 tablet (5 mg total) by mouth daily.  Marland Kitchen loperamide (IMODIUM A-D) 2 MG tablet Take 2 mg by mouth 4 (four) times daily as needed for diarrhea or loose stools.   . memantine (NAMENDA XR) 28 MG CP24 24 hr capsule Take 1 capsule (28 mg total) by mouth daily.  . metFORMIN (GLUCOPHAGE) 500 MG tablet Take 500 mg by mouth daily.   . mirabegron ER (MYRBETRIQ) 50 MG TB24 tablet Take 50 mg by mouth daily.  . Multiple Vitamin (MULTIVITAMIN) tablet Take 1 tablet by mouth daily.    Marland Kitchen nystatin (NYSTATIN) powder Apply topically 2 (two) times daily as needed. Apply to redness and scrotal and sacral area  . potassium chloride (KLOR-CON) 10 MEQ CR tablet Take 10 mEq by mouth 2 (two) times a week. On mondays and fridays  . Probiotic Product (ALIGN) 4 MG CAPS Take 4 mg by mouth daily.  . simvastatin (ZOCOR) 10 MG tablet Take 10 mg daily by mouth.  Marland Kitchen ZINC OXIDE EX Apply 1 application topically 3 (three) times daily as needed.   . [DISCONTINUED] hydrocortisone cream 1 % Apply 1 application topically 2 (two) times daily.   No facility-administered encounter medications on file as of 03/06/2017.     Review of Systems  Constitutional: Negative for appetite change and fever.  HENT: Positive for hearing loss. Negative for congestion, mouth sores and rhinorrhea.   Respiratory: Negative for cough and shortness of breath.   Cardiovascular: Negative for chest pain and palpitations.  Gastrointestinal: Negative for abdominal pain, diarrhea, nausea and vomiting.  Genitourinary: Negative for dysuria.  Musculoskeletal: Positive for gait problem.  Skin: Negative for rash.  Neurological: Negative for dizziness and headaches.  Psychiatric/Behavioral: Positive for behavioral problems and confusion.    Immunization History  Administered Date(s) Administered  . Influenza-Unspecified 01/10/2017  . PPD Test 06/24/2016    Pertinent  Health Maintenance Due  Topic Date Due  . FOOT EXAM  09/28/2017 (Originally 06/06/1940)  . OPHTHALMOLOGY EXAM  09/28/2017 (Originally 06/06/1940)  . HEMOGLOBIN A1C  07/04/2017  . INFLUENZA VACCINE  Completed  . PNA vac Low Risk Adult  Completed   Fall Risk  12/07/2016 01/19/2016 07/21/2015  Falls in the past year? No Yes Yes  Number falls in past yr: - 1 1  Injury with Fall? - No No  Risk for fall due to : - Impaired balance/gait -  Follow up - Falls prevention discussed Falls prevention discussed   Functional Status Survey:    Vitals:   03/06/17 1356  BP: 134/77  Pulse: 89  Resp: 17  Temp: 97.6 F (36.4 C)  SpO2: 97%  Weight: 173 lb 12.8 oz (78.8 kg)  Height: 5\' 11"  (1.803 m)   Body mass index is 24.24 kg/m.   Wt Readings from Last 3 Encounters:  03/06/17 173 lb 12.8 oz (78.8  kg)  03/04/17 177 lb (80.3 kg)  02/13/17 183 lb 6.4 oz (83.2 kg)   Physical Exam  Constitutional: He appears well-developed and well-nourished. No distress.  HENT:  Head: Normocephalic and atraumatic.  Mouth/Throat: Oropharynx is clear and moist.  Eyes: Conjunctivae are normal. Pupils are equal, round, and reactive to light.  Neck: Neck supple.  Cardiovascular: Normal rate and regular rhythm.  Pulmonary/Chest: Effort normal and breath sounds normal.  Abdominal: Soft. Bowel sounds are normal. There is no tenderness.  Musculoskeletal: He exhibits no edema.  Can move all 4 extremities, weakness of LE, on wheelchair  Lymphadenopathy:    He has no cervical adenopathy.  Neurological: He is alert.  Oriented to self only  Skin: Skin is warm and dry. He is not diaphoretic.  Psychiatric: He has a normal mood and affect.    Labs reviewed: Recent Labs    11/28/16 2315  11/30/16 0429 12/04/16 12/18/16 1503 03/04/17 1520  NA  --    < > 140 140 142 144  K  --    < > 3.7 4.0 4.6 3.9  CL  --    < > 106  --  102 108*  CO2  --    < > 27  --  21 21  GLUCOSE  --    < > 135*  --  141* 202*   BUN  --    < > 20 15 15 19   CREATININE 0.94   < > 0.99 1.0 1.04 1.02  CALCIUM  --    < > 8.5*  --  9.0 9.1  MG 1.8  --   --   --   --   --    < > = values in this interval not displayed.   Recent Labs    03/16/16 1201 06/23/16 0430 06/26/16 09/25/16 11/28/16 1129  AST 22 18 17 19  60*  ALT 27 23 20 27  37  ALKPHOS 77 66 59 75 73  BILITOT 0.9 0.7  --   --  1.5*  PROT 7.0 5.8*  --   --  5.9*  ALBUMIN 4.0 3.2*  --   --  3.1*   Recent Labs    06/22/16 1057  11/28/16 1129 11/28/16 2315 11/30/16 0429 12/04/16 03/04/17 1520  WBC 18.7*   < > 17.1* 11.2* 9.8 9.0 10.0  NEUTROABS 16.0*  --  14.9*  --   --   --   --   HGB 15.0   < > 14.0 13.8 12.7* 13.7 12.9*  HCT 43.9   < > 40.4 40.3 37.5* 40* 39.3  MCV 96.9   < > 94.4 94.4 95.7  --  99*  PLT 130*   < > 145* 155 128* 165 151   < > = values in this interval not displayed.   Lab Results  Component Value Date   TSH 2.40 06/26/2016   Lab Results  Component Value Date   HGBA1C 7.0 01/03/2017   Lab Results  Component Value Date   CHOL 103 01/31/2017   HDL 49 01/31/2017   LDLCALC 41 01/31/2017   TRIG 47 01/31/2017    Significant Diagnostic Results in last 30 days:  No results found.  Assessment/Plan  Type 2 diabetes mellitus Lab Results  Component Value Date   HGBA1C 7.0 01/03/2017   cbg between 100-130. Continue metformin 500 mg daily and monitor. Continue ACEI for renal protection. uptodate with eye and foot exam  OAB Continue mirabegron and monitor  Protein calorie  malnutrition Losing weight. Low albumin level. RD consult. Encourage and monitor po intake. Has been feeding himself.  Hyperlipidemia LDL at goal. Decrease simvastatin to 5 mg daily. Check lipid panel in 3 months.   Unsteady gait Wheelchair bound, fall precautions, poor safety awareness with dementia and high fall risk.   Family/ staff Communication: reviewed care plan with patient and charge nurse.    Labs/tests ordered:  A1c, lipid in 3  months   [name], MD Internal Medicine [facility] [address] [place], [place] [address] Cell Phone (Monday-Friday 8 am - 5 pm): [phone] On Call: [phone] and follow prompts after 5 pm and on weekends Office Phone: [phone] Office Fax: [phone]

## 2017-03-29 ENCOUNTER — Emergency Department (HOSPITAL_COMMUNITY): Payer: Medicare Other

## 2017-03-29 ENCOUNTER — Inpatient Hospital Stay (HOSPITAL_COMMUNITY)
Admission: EM | Admit: 2017-03-29 | Discharge: 2017-04-01 | DRG: 871 | Disposition: A | Discharge status: Skilled Nursing Facility | Payer: Medicare Other | Attending: Internal Medicine | Admitting: Internal Medicine

## 2017-03-29 ENCOUNTER — Other Ambulatory Visit: Payer: Self-pay

## 2017-03-29 ENCOUNTER — Encounter (HOSPITAL_COMMUNITY): Payer: Self-pay | Admitting: *Deleted

## 2017-03-29 DIAGNOSIS — A419 Sepsis, unspecified organism: Principal | ICD-10-CM | POA: Diagnosis present

## 2017-03-29 DIAGNOSIS — Z6824 Body mass index (BMI) 24.0-24.9, adult: Secondary | ICD-10-CM | POA: Diagnosis not present

## 2017-03-29 DIAGNOSIS — Z9842 Cataract extraction status, left eye: Secondary | ICD-10-CM

## 2017-03-29 DIAGNOSIS — Z86010 Personal history of colonic polyps: Secondary | ICD-10-CM | POA: Diagnosis not present

## 2017-03-29 DIAGNOSIS — R32 Unspecified urinary incontinence: Secondary | ICD-10-CM | POA: Diagnosis present

## 2017-03-29 DIAGNOSIS — E785 Hyperlipidemia, unspecified: Secondary | ICD-10-CM | POA: Diagnosis present

## 2017-03-29 DIAGNOSIS — F0280 Dementia in other diseases classified elsewhere without behavioral disturbance: Secondary | ICD-10-CM | POA: Diagnosis present

## 2017-03-29 DIAGNOSIS — Z993 Dependence on wheelchair: Secondary | ICD-10-CM | POA: Diagnosis not present

## 2017-03-29 DIAGNOSIS — Z87891 Personal history of nicotine dependence: Secondary | ICD-10-CM

## 2017-03-29 DIAGNOSIS — J690 Pneumonitis due to inhalation of food and vomit: Secondary | ICD-10-CM | POA: Diagnosis present

## 2017-03-29 DIAGNOSIS — J069 Acute upper respiratory infection, unspecified: Secondary | ICD-10-CM | POA: Diagnosis present

## 2017-03-29 DIAGNOSIS — R2689 Other abnormalities of gait and mobility: Secondary | ICD-10-CM | POA: Diagnosis present

## 2017-03-29 DIAGNOSIS — Z85828 Personal history of other malignant neoplasm of skin: Secondary | ICD-10-CM

## 2017-03-29 DIAGNOSIS — F339 Major depressive disorder, recurrent, unspecified: Secondary | ICD-10-CM | POA: Diagnosis present

## 2017-03-29 DIAGNOSIS — Z9841 Cataract extraction status, right eye: Secondary | ICD-10-CM

## 2017-03-29 DIAGNOSIS — B9729 Other coronavirus as the cause of diseases classified elsewhere: Secondary | ICD-10-CM | POA: Diagnosis present

## 2017-03-29 DIAGNOSIS — E440 Moderate protein-calorie malnutrition: Secondary | ICD-10-CM | POA: Diagnosis present

## 2017-03-29 DIAGNOSIS — G309 Alzheimer's disease, unspecified: Secondary | ICD-10-CM | POA: Diagnosis present

## 2017-03-29 DIAGNOSIS — D72829 Elevated white blood cell count, unspecified: Secondary | ICD-10-CM | POA: Diagnosis present

## 2017-03-29 DIAGNOSIS — N182 Chronic kidney disease, stage 2 (mild): Secondary | ICD-10-CM | POA: Diagnosis present

## 2017-03-29 DIAGNOSIS — N3281 Overactive bladder: Secondary | ICD-10-CM | POA: Diagnosis present

## 2017-03-29 DIAGNOSIS — E1122 Type 2 diabetes mellitus with diabetic chronic kidney disease: Secondary | ICD-10-CM | POA: Diagnosis present

## 2017-03-29 DIAGNOSIS — I5042 Chronic combined systolic (congestive) and diastolic (congestive) heart failure: Secondary | ICD-10-CM | POA: Diagnosis present

## 2017-03-29 DIAGNOSIS — M5442 Lumbago with sciatica, left side: Secondary | ICD-10-CM | POA: Diagnosis present

## 2017-03-29 DIAGNOSIS — R2681 Unsteadiness on feet: Secondary | ICD-10-CM | POA: Diagnosis not present

## 2017-03-29 DIAGNOSIS — R350 Frequency of micturition: Secondary | ICD-10-CM | POA: Diagnosis not present

## 2017-03-29 DIAGNOSIS — G8929 Other chronic pain: Secondary | ICD-10-CM | POA: Diagnosis present

## 2017-03-29 DIAGNOSIS — Z8249 Family history of ischemic heart disease and other diseases of the circulatory system: Secondary | ICD-10-CM

## 2017-03-29 DIAGNOSIS — Z96643 Presence of artificial hip joint, bilateral: Secondary | ICD-10-CM | POA: Diagnosis present

## 2017-03-29 DIAGNOSIS — M5441 Lumbago with sciatica, right side: Secondary | ICD-10-CM | POA: Diagnosis present

## 2017-03-29 DIAGNOSIS — Z973 Presence of spectacles and contact lenses: Secondary | ICD-10-CM

## 2017-03-29 DIAGNOSIS — Z888 Allergy status to other drugs, medicaments and biological substances status: Secondary | ICD-10-CM

## 2017-03-29 DIAGNOSIS — D696 Thrombocytopenia, unspecified: Secondary | ICD-10-CM | POA: Diagnosis present

## 2017-03-29 DIAGNOSIS — F418 Other specified anxiety disorders: Secondary | ICD-10-CM | POA: Diagnosis present

## 2017-03-29 DIAGNOSIS — R1312 Dysphagia, oropharyngeal phase: Secondary | ICD-10-CM | POA: Diagnosis present

## 2017-03-29 DIAGNOSIS — I130 Hypertensive heart and chronic kidney disease with heart failure and stage 1 through stage 4 chronic kidney disease, or unspecified chronic kidney disease: Secondary | ICD-10-CM | POA: Diagnosis present

## 2017-03-29 HISTORY — DX: Type 2 diabetes mellitus without complications: E11.9

## 2017-03-29 HISTORY — DX: Depression, unspecified: F32.A

## 2017-03-29 HISTORY — DX: Major depressive disorder, single episode, unspecified: F32.9

## 2017-03-29 HISTORY — DX: Heart failure, unspecified: I50.9

## 2017-03-29 LAB — RESPIRATORY PANEL BY PCR
Adenovirus: NOT DETECTED
BORDETELLA PERTUSSIS-RVPCR: NOT DETECTED
CORONAVIRUS 229E-RVPPCR: NOT DETECTED
Chlamydophila pneumoniae: NOT DETECTED
Coronavirus HKU1: NOT DETECTED
Coronavirus NL63: NOT DETECTED
Coronavirus OC43: DETECTED — AB
INFLUENZA B-RVPPCR: NOT DETECTED
Influenza A: NOT DETECTED
METAPNEUMOVIRUS-RVPPCR: NOT DETECTED
Mycoplasma pneumoniae: NOT DETECTED
PARAINFLUENZA VIRUS 2-RVPPCR: NOT DETECTED
Parainfluenza Virus 1: NOT DETECTED
Parainfluenza Virus 3: NOT DETECTED
Parainfluenza Virus 4: NOT DETECTED
RESPIRATORY SYNCYTIAL VIRUS-RVPPCR: NOT DETECTED
RHINOVIRUS / ENTEROVIRUS - RVPPCR: NOT DETECTED

## 2017-03-29 LAB — COMPREHENSIVE METABOLIC PANEL
ALBUMIN: 3.8 g/dL (ref 3.5–5.0)
ALT: 25 U/L (ref 17–63)
ANION GAP: 8 (ref 5–15)
AST: 25 U/L (ref 15–41)
Alkaline Phosphatase: 113 U/L (ref 38–126)
BUN: 10 mg/dL (ref 6–20)
CO2: 25 mmol/L (ref 22–32)
Calcium: 9 mg/dL (ref 8.9–10.3)
Chloride: 103 mmol/L (ref 101–111)
Creatinine, Ser: 1.09 mg/dL (ref 0.61–1.24)
GFR calc Af Amer: >60 mL/min (ref >60)
GFR calc non Af Amer: >60 mL/min (ref >60)
GLUCOSE: 173 mg/dL — AB (ref 65–99)
POTASSIUM: 3.6 mmol/L (ref 3.5–5.1)
SODIUM: 136 mmol/L (ref 135–145)
Total Bilirubin: 0.6 mg/dL (ref 0.3–1.2)
Total Protein: 6.8 g/dL (ref 6.5–8.1)

## 2017-03-29 LAB — URINALYSIS, ROUTINE W REFLEX MICROSCOPIC
Bilirubin Urine: NEGATIVE
GLUCOSE, UA: NEGATIVE mg/dL
Hgb urine dipstick: NEGATIVE
KETONES UR: 5 mg/dL — AB
LEUKOCYTES UA: NEGATIVE
Nitrite: NEGATIVE
PH: 6 (ref 5.0–8.0)
Protein, ur: NEGATIVE mg/dL
Specific Gravity, Urine: 1.014 (ref 1.005–1.030)

## 2017-03-29 LAB — CBC WITH DIFFERENTIAL/PLATELET
BASOS ABS: 0 10*3/uL (ref 0.0–0.1)
Basophils Relative: 0 %
Eosinophils Absolute: 0 10*3/uL (ref 0.0–0.7)
Eosinophils Relative: 0 %
HEMATOCRIT: 43 % (ref 39.0–52.0)
Hemoglobin: 14.3 g/dL (ref 13.0–17.0)
LYMPHS ABS: 0.8 10*3/uL (ref 0.7–4.0)
LYMPHS PCT: 6 %
MCH: 32 pg (ref 26.0–34.0)
MCHC: 33.3 g/dL (ref 30.0–36.0)
MCV: 96.2 fL (ref 78.0–100.0)
MONO ABS: 0.5 10*3/uL (ref 0.1–1.0)
MONOS PCT: 3 %
NEUTROS ABS: 12.6 10*3/uL — AB (ref 1.7–7.7)
Neutrophils Relative %: 91 %
Platelets: 129 10*3/uL — ABNORMAL LOW (ref 150–400)
RBC: 4.47 MIL/uL (ref 4.22–5.81)
RDW: 13.1 % (ref 11.5–15.5)
WBC: 13.9 10*3/uL — ABNORMAL HIGH (ref 4.0–10.5)

## 2017-03-29 LAB — LACTIC ACID, PLASMA
LACTIC ACID, VENOUS: 1.8 mmol/L (ref 0.5–1.9)
LACTIC ACID, VENOUS: 1.3 mmol/L (ref 0.5–1.9)

## 2017-03-29 LAB — APTT: aPTT: 29 seconds (ref 24–36)

## 2017-03-29 LAB — HEMOGLOBIN A1C
HEMOGLOBIN A1C: 6.5 % — AB (ref 4.8–5.6)
MEAN PLASMA GLUCOSE: 139.85 mg/dL

## 2017-03-29 LAB — I-STAT CG4 LACTIC ACID, ED: LACTIC ACID, VENOUS: 2.24 mmol/L — AB (ref 0.5–1.9)

## 2017-03-29 LAB — GLUCOSE, CAPILLARY
Glucose-Capillary: 123 mg/dL — ABNORMAL HIGH (ref 65–99)
Glucose-Capillary: 133 mg/dL — ABNORMAL HIGH (ref 65–99)

## 2017-03-29 LAB — PROCALCITONIN: Procalcitonin: <0.1 ng/mL

## 2017-03-29 MED ORDER — BISACODYL 10 MG RE SUPP: 10.0000 mg | Freq: Every day | RECTAL | Status: DC | PRN

## 2017-03-29 MED ORDER — ACETAMINOPHEN 650 MG RE SUPP
650.0000 mg | Freq: Four times a day (QID) | RECTAL | Status: DC | PRN
  Administered 2017-03-30: 650 mg via RECTAL
  Filled 2017-03-29: qty 1

## 2017-03-29 MED ORDER — HYDROCERIN EX CREA
TOPICAL_CREAM | Freq: Every day | CUTANEOUS | Status: DC
  Administered 2017-03-31: 13:00:00 via TOPICAL
  Administered 2017-04-01: 1 via TOPICAL
  Filled 2017-03-29: qty 113

## 2017-03-29 MED ORDER — DORZOLAMIDE HCL-TIMOLOL MAL 2-0.5 % OP SOLN
1.0000 [drp] | Freq: Two times a day (BID) | OPHTHALMIC | Status: DC
  Administered 2017-03-30 – 2017-04-01 (×5): 1 [drp] via OPHTHALMIC
  Filled 2017-03-29: qty 10

## 2017-03-29 MED ORDER — MEMANTINE HCL ER 28 MG PO CP24
28.0000 mg | ORAL_CAPSULE | Freq: Every day | ORAL | Status: DC
  Filled 2017-03-29: qty 1

## 2017-03-29 MED ORDER — LISINOPRIL 2.5 MG PO TABS
5.0000 mg | ORAL_TABLET | Freq: Every day | ORAL | Status: DC
  Administered 2017-03-30: 5 mg via ORAL
  Filled 2017-03-29 (×2): qty 2

## 2017-03-29 MED ORDER — FOLIC ACID 400 MCG PO TABS: 800.0000 ug | ORAL_TABLET | Freq: Every evening | ORAL | Status: DC

## 2017-03-29 MED ORDER — ALIGN 4 MG PO CAPS: 4.0000 mg | ORAL_CAPSULE | Freq: Every day | ORAL | Status: DC

## 2017-03-29 MED ORDER — ACETAMINOPHEN 325 MG PO TABS
650.0000 mg | ORAL_TABLET | Freq: Once | ORAL | Status: AC
  Administered 2017-03-29: 650 mg via ORAL
  Filled 2017-03-29: qty 2

## 2017-03-29 MED ORDER — SIMVASTATIN 10 MG PO TABS
5.0000 mg | ORAL_TABLET | Freq: Every day | ORAL | Status: DC
  Administered 2017-03-30 – 2017-04-01 (×3): 5 mg via ORAL
  Filled 2017-03-29 (×3): qty 1
  Filled 2017-03-29: qty 0.5

## 2017-03-29 MED ORDER — DEXTROMETHORPHAN POLISTIREX ER 30 MG/5ML PO SUER
60.0000 mg | Freq: Two times a day (BID) | ORAL | Status: DC | PRN
  Administered 2017-03-31 – 2017-04-01 (×2): 60 mg via ORAL
  Filled 2017-03-29 (×3): qty 10

## 2017-03-29 MED ORDER — ACETAMINOPHEN 325 MG PO TABS: 325.0000 mg | ORAL_TABLET | Freq: Once | ORAL | Status: DC

## 2017-03-29 MED ORDER — VANCOMYCIN HCL IN DEXTROSE 1-5 GM/200ML-% IV SOLN
1000.0000 mg | Freq: Two times a day (BID) | INTRAVENOUS | Status: DC
  Administered 2017-03-30: 1000 mg via INTRAVENOUS
  Filled 2017-03-29 (×2): qty 200

## 2017-03-29 MED ORDER — ONDANSETRON HCL 4 MG/2ML IJ SOLN: 4.0000 mg | Freq: Four times a day (QID) | INTRAMUSCULAR | Status: DC | PRN

## 2017-03-29 MED ORDER — ACETAMINOPHEN 650 MG RE SUPP
650.0000 mg | Freq: Once | RECTAL | Status: AC
  Administered 2017-03-29: 650 mg via RECTAL
  Filled 2017-03-29: qty 1

## 2017-03-29 MED ORDER — FOLIC ACID 1 MG PO TABS
1.0000 mg | ORAL_TABLET | Freq: Every day | ORAL | Status: DC
  Administered 2017-03-30 – 2017-04-01 (×3): 1 mg via ORAL
  Filled 2017-03-29 (×3): qty 1

## 2017-03-29 MED ORDER — BUPROPION HCL ER (SR) 100 MG PO TB12
100.0000 mg | ORAL_TABLET | Freq: Every day | ORAL | Status: DC
  Administered 2017-03-30 – 2017-04-01 (×3): 100 mg via ORAL
  Filled 2017-03-29 (×4): qty 1

## 2017-03-29 MED ORDER — HYDROCORTISONE 2.5 % RE CREA
1.0000 "application " | TOPICAL_CREAM | Freq: Every evening | RECTAL | Status: DC | PRN
  Filled 2017-03-29: qty 28.35

## 2017-03-29 MED ORDER — METRONIDAZOLE IN NACL 5-0.79 MG/ML-% IV SOLN
500.0000 mg | Freq: Three times a day (TID) | INTRAVENOUS | Status: DC
  Administered 2017-03-29 – 2017-03-30 (×3): 500 mg via INTRAVENOUS
  Filled 2017-03-29 (×3): qty 100

## 2017-03-29 MED ORDER — IPRATROPIUM-ALBUTEROL 0.5-2.5 (3) MG/3ML IN SOLN
3.0000 mL | Freq: Four times a day (QID) | RESPIRATORY_TRACT | Status: DC
  Administered 2017-03-29: 3 mL via RESPIRATORY_TRACT
  Filled 2017-03-29: qty 3

## 2017-03-29 MED ORDER — SACCHAROMYCES BOULARDII 250 MG PO CAPS
250.0000 mg | ORAL_CAPSULE | Freq: Two times a day (BID) | ORAL | Status: DC
  Administered 2017-03-30 – 2017-04-01 (×5): 250 mg via ORAL
  Filled 2017-03-29 (×5): qty 1

## 2017-03-29 MED ORDER — SODIUM CHLORIDE 0.9 % IV BOLUS (SEPSIS)
1000.0000 mL | Freq: Once | INTRAVENOUS | Status: AC
  Administered 2017-03-29: 1000 mL via INTRAVENOUS

## 2017-03-29 MED ORDER — HYDRALAZINE HCL 20 MG/ML IJ SOLN: 5.0000 mg | Freq: Three times a day (TID) | INTRAMUSCULAR | Status: DC | PRN

## 2017-03-29 MED ORDER — FUROSEMIDE 20 MG PO TABS: 20.0000 mg | ORAL_TABLET | Freq: Every day | ORAL | Status: DC

## 2017-03-29 MED ORDER — CARVEDILOL 3.125 MG PO TABS
3.1250 mg | ORAL_TABLET | Freq: Two times a day (BID) | ORAL | Status: DC
  Administered 2017-03-30 – 2017-04-01 (×5): 3.125 mg via ORAL
  Filled 2017-03-29 (×5): qty 1

## 2017-03-29 MED ORDER — HEPARIN SODIUM (PORCINE) 5000 UNIT/ML IJ SOLN
5000.0000 [IU] | Freq: Three times a day (TID) | INTRAMUSCULAR | Status: DC
  Administered 2017-03-29 – 2017-04-01 (×8): 5000 [IU] via SUBCUTANEOUS
  Filled 2017-03-29 (×8): qty 1

## 2017-03-29 MED ORDER — IPRATROPIUM-ALBUTEROL 0.5-2.5 (3) MG/3ML IN SOLN: 3.0000 mL | RESPIRATORY_TRACT | Status: DC | PRN

## 2017-03-29 MED ORDER — DEXTROSE 5 % IV SOLN
2.0000 g | Freq: Once | INTRAVENOUS | Status: AC
  Administered 2017-03-29: 2 g via INTRAVENOUS
  Filled 2017-03-29: qty 2

## 2017-03-29 MED ORDER — MIRABEGRON ER 50 MG PO TB24
50.0000 mg | ORAL_TABLET | Freq: Every day | ORAL | Status: DC
  Administered 2017-03-30 – 2017-04-01 (×3): 50 mg via ORAL
  Filled 2017-03-29 (×4): qty 1

## 2017-03-29 MED ORDER — INSULIN ASPART 100 UNIT/ML ~~LOC~~ SOLN
0.0000 [IU] | Freq: Three times a day (TID) | SUBCUTANEOUS | Status: DC
  Administered 2017-03-30: 2 [IU] via SUBCUTANEOUS
  Administered 2017-03-30: 1 [IU] via SUBCUTANEOUS
  Administered 2017-03-31 (×2): 2 [IU] via SUBCUTANEOUS

## 2017-03-29 MED ORDER — SENNOSIDES-DOCUSATE SODIUM 8.6-50 MG PO TABS: 1.0000 | ORAL_TABLET | Freq: Every evening | ORAL | Status: DC | PRN

## 2017-03-29 MED ORDER — DEXTROSE 5 % IV SOLN
2.0000 g | Freq: Three times a day (TID) | INTRAVENOUS | Status: DC
  Administered 2017-03-29 – 2017-03-30 (×2): 2 g via INTRAVENOUS
  Filled 2017-03-29 (×3): qty 2

## 2017-03-29 MED ORDER — VITAMIN D 1000 UNITS PO TABS: 1000.0000 [IU] | ORAL_TABLET | Freq: Every day | ORAL | Status: DC

## 2017-03-29 MED ORDER — VITAMIN D 1000 UNITS PO CAPS: 1000.0000 [IU] | ORAL_CAPSULE | Freq: Every day | ORAL | Status: DC

## 2017-03-29 MED ORDER — LOPERAMIDE HCL 2 MG PO TABS: 2.0000 mg | ORAL_TABLET | Freq: Four times a day (QID) | ORAL | Status: DC | PRN

## 2017-03-29 MED ORDER — DONEPEZIL HCL 23 MG PO TABS
23.0000 mg | ORAL_TABLET | Freq: Every day | ORAL | Status: DC
Start: 2017-03-29 — End: 2017-03-29
  Filled 2017-03-29: qty 1

## 2017-03-29 MED ORDER — SODIUM CHLORIDE 0.9 % IV SOLN
1500.0000 mg | Freq: Once | INTRAVENOUS | Status: AC
  Administered 2017-03-29: 1500 mg via INTRAVENOUS
  Filled 2017-03-29: qty 1500

## 2017-03-29 MED ORDER — CERAVE EX CREA: 1.0000 "application " | TOPICAL_CREAM | Freq: Every day | CUTANEOUS | Status: DC

## 2017-03-29 MED ORDER — IPRATROPIUM-ALBUTEROL 0.5-2.5 (3) MG/3ML IN SOLN
3.0000 mL | Freq: Three times a day (TID) | RESPIRATORY_TRACT | Status: DC
  Administered 2017-03-30 – 2017-04-01 (×7): 3 mL via RESPIRATORY_TRACT
  Filled 2017-03-29 (×7): qty 3

## 2017-03-29 MED ORDER — LOPERAMIDE HCL 2 MG PO CAPS: 2.0000 mg | ORAL_CAPSULE | Freq: Four times a day (QID) | ORAL | Status: DC | PRN

## 2017-03-29 MED ORDER — FLAXSEED OIL 1000 MG PO CAPS: 1000.0000 mg | ORAL_CAPSULE | Freq: Every day | ORAL | Status: DC

## 2017-03-29 MED ORDER — SODIUM CHLORIDE 0.9 % IV SOLN
INTRAVENOUS | Status: DC
  Administered 2017-03-29: 18:00:00 via INTRAVENOUS

## 2017-03-29 MED ORDER — ONDANSETRON HCL 4 MG PO TABS: 4.0000 mg | ORAL_TABLET | Freq: Four times a day (QID) | ORAL | Status: DC | PRN

## 2017-03-29 MED ORDER — DEXTROSE 5 % IV SOLN
1.0000 g | Freq: Once | INTRAVENOUS | Status: DC
  Filled 2017-03-29: qty 1

## 2017-03-29 NOTE — Progress Notes (Signed)
This is an addendeum of H&P dictated by APP Sharene Butters.  Jose [name] is a 83 y.o. male Chronic systolic and diastolic CHF, EF 34-19%, overactive bladder, hyperlipidemia, hypertension, unsteady gait-wheelchair-bound, dementia, brought to the ED for evaluation of altered mental status noticed this morning by his physical therapy at the nursing-rehab facility.   Patient seen and examined with his daughter at his bedside. He has advanced dementia. Daughter reports that just 2 days ago he was in his usual state of health.  Agree with assessment and plan done by APP.

## 2017-03-29 NOTE — Progress Notes (Signed)
PHARMACIST - PHYSICIAN ORDER COMMUNICATION  CONCERNING: P&T Medication Policy on Herbal Medications  DESCRIPTION:  This patient's order for:  flaxseed  has been noted.  This product(s) is classified as an "herbal" or natural product. Due to a lack of definitive safety studies or FDA approval, nonstandard manufacturing practices, plus the potential risk of unknown drug-drug interactions while on inpatient medications, the Pharmacy and Therapeutics Committee does not permit the use of "herbal" or natural products of this type within [facility].   ACTION TAKEN: The pharmacy department is unable to verify this order at this time and your patient has been informed of this safety policy. Please reevaluate patient's clinical condition at discharge and address if the herbal or natural product(s) should be resumed at that time.  Uvaldo Rising, BCPS  Clinical Pharmacist Pager [phone]  03/29/2017 4:49 PM

## 2017-03-29 NOTE — Care Management Note (Signed)
Case Management Note  Patient Details  Name: IZAHA SHUGHART MRN: [id number] Date of Birth: 1930/12/18  Subjective/Objective:                  83 y.o. male a history of CHF (EF 35-40%), overactive bladder, hyperlipidemia, unsteady gait, wheelchair-bound, dementia presents to ED for evaluation of altered mental status noticed this morning by a physical therapist at nursing/rehabilitation facility.  Action/Plan: Admit status INPATIENT (SEPSIS); anticipate discharge RETURN TO SNF.   Expected Discharge Date:       UNKNOWN           Expected Discharge Plan:  Skilled Nursing Facility([facility])  In-House Referral:  Clinical Social Work  Discharge planning Services  CM Consult  Status of Service:  In process, will continue to follow  If discussed at Long Length of Stay Meetings, dates discussed:    Additional Comments:  Fuller Mandril, RN 03/29/2017, 2:33 PM

## 2017-03-29 NOTE — Progress Notes (Signed)
Pharmacy Antibiotic Note  Jose [name] is a 83 y.o. male admitted on 03/29/2017 with pneumonia.  Pharmacy has been consulted for cefepime and vancomycin dosing. Pt here with increased confusion. WBC elevated at 13.9. LA 2.24. Tm 102.30F. SCr 1.09. CrCl ~ 50-55 mL/min   Plan: -Cefepime 2 gm IV Q 8 hours -Vancomycin 1500 mg IV Q 8 hours, then vancomycin 1 gm IV Q 12 hours -Monitor CBC, renal fx, cultures and clinical progress -VT at [facility]   Height: 6' (182.9 cm) Weight: 177 lb (80.3 kg) IBW/kg (Calculated) : 77.6  Temp (24hrs), Avg:102.4 F (39.1 C), Min:102.4 F (39.1 C), Max:102.4 F (39.1 C)  Recent Labs  Lab 03/29/17 1240 03/29/17 1254  WBC 13.9*  --   CREATININE 1.09  --   LATICACIDVEN  --  2.24*    Estimated Creatinine Clearance: 55.4 mL/min (by C-G formula based on SCr of 1.09 mg/dL).    Allergies  Allergen Reactions  . Axona [Bacid]     Unknown per MAR  . Gabapentin     Hallucinations    Antimicrobials this admission: Vanc 12/28>> Cefepime 12/28>>  Dose adjustments this admission: None   Microbiology results: 12/28 BCx >>  12/28 Resp panel >>   Thank you for allowing pharmacy to be a part of this patient's care.  Albertina Parr, PharmD., BCPS Clinical Pharmacist Pager [phone]

## 2017-03-29 NOTE — ED Triage Notes (Signed)
Wife states she was with husband last pm and he was at his baseline, left him at 6 pm. States she went to see him this am and was unresp. Had a non-productive cough and was non-verbal. History of dementia.

## 2017-03-29 NOTE — ED Notes (Signed)
Condom cath applied

## 2017-03-29 NOTE — ED Notes (Signed)
Pa at bedside patient able to veralize his name and recognizes his wife.

## 2017-03-29 NOTE — H&P (Signed)
History and Physical    ELIC VENCILL IRW:[id number] DOB: [birth year] DOA: 03/29/2017   PCP: [name], MD   Patient coming from:  Nursing Home    Chief Complaint: Increased confusion  HPI: Jose [name] is a 83 y.o. male Chronic systolic and diastolic CHF, EF 76-19%, overactive bladder, hyperlipidemia, hypertension, unsteady gait-wheelchair-bound, dementia, brought to the ED for evaluation of altered mental status noticed this morning by his physical therapy at the nursing-rehab facility.  Wife is the one providing all the history, as the level is the patient is level 5 caveat due to increased confusion.  From 6 PM, the patient was in his usual state of health, at baseline of his interaction level.  He was able to recognize family and was alert to self, place and events at the time.  However, this morning, patient was clearly confused.  Wife also reported thick clear rhinorrhea, with dry cough since this morning.  The patient is more somnolent.  Wife denies any noted facial drooping or slurred speech prior to these events.  No history of TIA or CVA.  No falls at the nursing facility.  No history of seizures.  She reported him to have chills, but she did not notice him having subjective fevers.  She denies the patient having increased lower extremity edema.  She did have decreased oral intake as his symptoms develop.  ED Course:  BP 125/67 (BP Location: Right Arm)   Pulse (!) 114   Temp (!) 102.4 F (39.1 C) (Rectal)   Resp 20   Ht 6' (1.829 m)   Wt 80.3 kg (177 lb)   SpO2 93%   BMI 24.01 kg/m    leukocytosis WBC 13 .9 , and evidence of organ dysfunction with lactic 2.24   Antibiotics delivered in the ED with Vanco and Maxipime, also 2 L IV NS bolus  UA pending.  CXR NAD, stable cardiomegaly. Resp virus panel pending    Review of Systems: Unable to obtain furhter ROS due to level V Caveat   Past Medical History:  Diagnosis Date  . Abnormality of gait   . Backache, unspecified     . Colon polyps    adenomatous  . Dementia 11/12/2012  . Diverticulosis   . Essential and other specified forms of tremor   . Hemorrhoids   . History of bilateral hip replacements 11/12/2012  . Memory loss   . 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 Pain in joint, pelvic region and thigh   . Skin cancer of scalp   . Spinal stenosis, lumbar region, without neurogenic claudication   . Unspecified hereditary and idiopathic peripheral neuropathy     Past Surgical History:  Procedure Laterality Date  . CATARACT EXTRACTION, BILATERAL  2012  . RETINAL LASER PROCEDURE  2012   retinal wrinkle  . SKIN CANCER EXCISION    . TOTAL HIP ARTHROPLASTY Left 2000  . TOTAL HIP ARTHROPLASTY (aka REPLACEMENT) Right 2011    Social History Social History   Socioeconomic History  . Marital status: Married    Spouse name: [name]  . Number of children: 2  . Years of education: Chief Executive Officer  . Highest education level: Not on file  Social Needs  . Financial resource strain: Not on file  . Food insecurity - worry: Not on file  . Food insecurity - inability: Not on file  . Transportation needs - medical: Not on file  . Transportation needs - non-medical: Not on file  Occupational History  . Occupation: Retired    Fish farm manager: [employer]    Comment: retired  Tobacco Use  . Smoking status: Former Smoker    Types: Cigarettes  . Smokeless tobacco: Never Used  Substance and Sexual Activity  . Alcohol use: Yes    Alcohol/week: 8.4 oz    Types: 14 Glasses of wine per week    Comment: occas, one glass of wine before dinner,sometimes 1/2 glass  . Drug use: No  . Sexual activity: No  Other Topics Concern  . Not on file  Social History Narrative   Patient is right handed and resides in home with wife     Allergies  Allergen Reactions  . Axona [Bacid]     Unknown per MAR  . Gabapentin     Hallucinations    Family History  Problem Relation Age of Onset  .  Heart failure Mother       Prior to Admission medications   Medication Sig Start Date End Date Taking? Authorizing Provider  acetaminophen (TYLENOL) 500 MG tablet Take 500 mg by mouth every 8 (eight) hours as needed (pain).     [provider]  buPROPion (WELLBUTRIN SR) 100 MG 12 hr tablet Take 100 mg by mouth daily. 11/23/16   [provider]  carvedilol (COREG) 3.125 MG tablet Take 1 tablet (3.125 mg total) by mouth 2 (two) times daily with a meal. 11/30/16   Theodis Blaze, MD  Cholecalciferol (VITAMIN D) 1000 UNITS capsule Take 1,000 Units by mouth daily.      [provider]  dextromethorphan (DELSYM) 30 MG/5ML liquid Take 60 mg by mouth every 12 (twelve) hours as needed for cough. Take 10 mL    [provider]  donepezil (ARICEPT) 23 MG TABS tablet Take 1 tablet (23 mg total) by mouth daily. 10/19/15   Star Age, MD  dorzolamide-timolol (COSOPT) 22.3-6.8 MG/ML ophthalmic solution Place 1 drop into the left eye 2 (two) times daily.  09/20/12   [provider]  Emollient (CERAVE) CREA Apply 1 application topically daily. Apply to arms, legs and trunk    [provider]  Flaxseed, Linseed, (FLAXSEED OIL) 1000 MG CAPS Take 1,000 mg by mouth daily.      [provider]  folic acid (FOLVITE) 782 MCG tablet Take 800 mcg by mouth every evening.     [provider]  furosemide (LASIX) 20 MG tablet Take 1 tablet (20 mg total) by mouth daily. 12/01/16   Theodis Blaze, MD  hydrocortisone (PROCTOSOL HC) 2.5 % rectal cream Place 1 application rectally at bedtime as needed for hemorrhoids or anal itching.    [provider]  lisinopril (PRINIVIL,ZESTRIL) 5 MG tablet Take 1 tablet (5 mg total) by mouth daily. 12/01/16   Theodis Blaze, MD  loperamide (IMODIUM A-D) 2 MG tablet Take 2 mg by mouth 4 (four) times daily as needed for diarrhea or loose stools.     [provider]  memantine (NAMENDA XR) 28 MG CP24 24 hr capsule  Take 1 capsule (28 mg total) by mouth daily. 09/27/15   Star Age, MD  metFORMIN (GLUCOPHAGE) 500 MG tablet Take 500 mg by mouth daily.  10/25/12   [provider]  mirabegron ER (MYRBETRIQ) 50 MG TB24 tablet Take 50 mg by mouth daily.    [provider]  Multiple Vitamin (MULTIVITAMIN) tablet Take 1 tablet by mouth daily.      [provider]  nystatin (NYSTATIN) powder  Apply topically 2 (two) times daily as needed. Apply to redness and scrotal and sacral area    [provider]  potassium chloride (KLOR-CON) 10 MEQ CR tablet Take 10 mEq by mouth 2 (two) times a week. On mondays and fridays    [provider]  Probiotic Product (ALIGN) 4 MG CAPS Take 4 mg by mouth daily.    [provider]  simvastatin (ZOCOR) 10 MG tablet Take 10 mg daily by mouth.    [provider]  ZINC OXIDE EX Apply 1 application topically 3 (three) times daily as needed.     [provider]    Physical Exam:  Vitals:   03/29/17 1218 03/29/17 1220 03/29/17 1300 03/29/17 1338  BP: (!) 130/97  (!) 157/81 125/67  Pulse: (!) 106  (!) 109 (!) 114  Resp: (!) 22  (!) 29 20  Temp: (!) 102.4 F (39.1 C)     TempSrc: Rectal     SpO2: 97%  94% 93%  Weight:  80.3 kg (177 lb)    Height:  6' (1.829 m)     Constitutional: NAD, calm, asleep, reacts to verbal stimulus  Eyes: PERRL, lids and conjunctivae normal ENMT: Mucous membranes are moist, moderate amoundtof rhinorrhea bilaterally, dry lips  Neck: normal, supple, no masses, no thyromegaly Respiratory:   no wheezing, bilateral expiratory rhonchi, decreased breath sounds at the bases  no crackles. Normal respiratory effort  Cardiovascular: tachycardic  rate and rhythm,  murmur, rubs or gallops. No extremity edema. 2+ pedal pulses. No carotid bruits.  Abdomen: Soft, non tender, No hepatosplenomegaly. Bowel sounds positive.  Musculoskeletal: no clubbing / cyanosis. Moves all extremities Skin: no jaundice,  No lesions.  Neurologic: Sensation intact  Strength unable to be tested due to current status       Labs on Admission: I have personally reviewed following labs and imaging studies  CBC: Recent Labs  Lab 03/29/17 1240  WBC 13.9*  NEUTROABS 12.6*  HGB 14.3  HCT 43.0  MCV 96.2  PLT 129*    Basic Metabolic Panel: Recent Labs  Lab 03/29/17 1240  NA 136  K 3.6  CL 103  CO2 25  GLUCOSE 173*  BUN 10  CREATININE 1.09  CALCIUM 9.0    GFR: Estimated Creatinine Clearance: 55.4 mL/min (by C-G formula based on SCr of 1.09 mg/dL).  Liver Function Tests: Recent Labs  Lab 03/29/17 1240  AST 25  ALT 25  ALKPHOS 113  BILITOT 0.6  PROT 6.8  ALBUMIN 3.8   No results for input(s): LIPASE, AMYLASE in the last 168 hours. No results for input(s): AMMONIA in the last 168 hours.  Coagulation Profile: No results for input(s): INR, PROTIME in the last 168 hours.  Cardiac Enzymes: No results for input(s): CKTOTAL, CKMB, CKMBINDEX, TROPONINI in the last 168 hours.  BNP (last 3 results) Recent Labs    12/18/16 1503 03/04/17 1520  PROBNP 1,931* 1,249*    HbA1C: No results for input(s): HGBA1C in the last 72 hours.  CBG: No results for input(s): GLUCAP in the last 168 hours.  Lipid Profile: No results for input(s): CHOL, HDL, LDLCALC, TRIG, CHOLHDL, LDLDIRECT in the last 72 hours.  Thyroid Function Tests: No results for input(s): TSH, T4TOTAL, FREET4, T3FREE, THYROIDAB in the last 72 hours.  Anemia Panel: No results for input(s): VITAMINB12, FOLATE, FERRITIN, TIBC, IRON, RETICCTPCT in the last 72 hours.  Urine analysis:    Component Value Date/Time   COLORURINE YELLOW 11/28/2016 [place]  CLEAR 11/28/2016 1457   LABSPEC 1.036 (H) 11/28/2016 1457   PHURINE 5.0 11/28/2016 1457   GLUCOSEU >=500 (A) 11/28/2016 1457   HGBUR NEGATIVE 11/28/2016 1457   BILIRUBINUR NEGATIVE 11/28/2016 1457   KETONESUR NEGATIVE 11/28/2016 1457   PROTEINUR NEGATIVE 11/28/2016  1457   UROBILINOGEN 0.2 06/24/2014 2112   NITRITE NEGATIVE 11/28/2016 1457   LEUKOCYTESUR NEGATIVE 11/28/2016 1457    Sepsis Labs: @LABRCNTIP (procalcitonin:4,lacticidven:4) )No results found for this or any previous visit (from the past 240 hour(s)).   Radiological Exams on Admission: Dg Chest Portable 1 View  Result Date: 03/29/2017 CLINICAL DATA:  Unresponsive.  Cough.  Fever. EXAM: PORTABLE CHEST 1 VIEW COMPARISON:  11/28/2016. FINDINGS: Mediastinum hilar structures normal. Cardiomegaly with normal pulmonary vascularity. No focal infiltrate. No pleural effusion or pneumothorax. Degenerative change thoracic spine. IMPRESSION: Stable cardiomegaly .  No acute cardiopulmonary disease. Electronically Signed   By: Marcello Moores  Register   On: 03/29/2017 13:30    EKG: Independently reviewed.  Assessment/Plan Active Problems:   Alzheimer's dementia without behavioral disturbance   Type 2 diabetes mellitus with diabetic chronic kidney disease (HCC)   Hyperlipidemia LDL goal <70   Leukocytosis   Urinary frequency   Thrombocytopenia (HCC)   Depression with anxiety   Major depression, recurrent, chronic (HCC)   Chronic bilateral low back pain with bilateral sciatica   Chronic combined systolic and diastolic congestive heart failure ([place])   Unsteady gait    Sepsis likely due to  respiratory source vs other etiology   Patient meets criteria given tachycardia, tachypnea on presentation , fever up to 102.4 , leukocytosis WBC 13 .9 , and evidence of organ dysfunction with lactic 2.24   Antibiotics delivered in the ED with Vanco and Maxipime, also 2 L IV NS bolus UA pending. CXR NAD, stable cardiomegaly. Resp virus panel pending  Admit to SDU Sepsis order set  IV antibiotics by pharmacy with Vanc and Cefepime  Follow lactic acid q 3 hrs Follow blood, sputum and urine cultures IV fluids at 100 cc/h.  Procalcitonin order set  CBC in AM   follow resp vir results  Will hold plans for CT head for  now, as his AMS is  likely due to infection    Type II Diabetes Current blood sugar level is 173  Lab Results  Component Value Date   HGBA1C 7.0 01/03/2017   Hg A1C Hold home oral diabetic medications.  SSI  Overactive bladder Continue Myrbetriq  Hypertension BP 125/67  Pulse  114    Continue home anti-hypertensive medications  Add Hydralazine Q8 hours as needed for BP 180  Hyperlipidemia Continue home statins   Chronic combined systolic and diastolic CHF  CXR NAD stable cardiomegaly   Last 2 D echo EF 30- 35 percent Gr 2 DD sys mod to severely reduced  Continue Lasix and other cardiac meds  monitor I/Os and daily weights prn 02 Follow up with Cards as OP   History of Alzheimers  Dementia Continue Aricept, Wellbutrin, NAmenda   Overactive Bladder Continue Myrbetriq  Thrombocytopenia , current 129 K  No transfusion is indicated at this time Monitor counts closely Transfuse 1 unit of platelets if count is less or equal than 10,000 or 20,000 if the patient is acutely bleeding Hold  Heparin if  platelets drop to less than 50,000  Overall deconditioning/Unsteady gait/Alzheimer's dementia  PT/OT      DVT prophylaxis:  Heparin Code Status:    Full  Family Communication:  Discussed with patient Disposition Plan: Expect  patient to be discharged to home after condition improves Consults called:    None  Admission status: SDU   Sharene Butters, PA-C [facility]   03/29/2017, 2:14 PM

## 2017-03-29 NOTE — ED Notes (Signed)
Patient repositioned in bed family at bedside

## 2017-03-29 NOTE — ED Provider Notes (Signed)
[facility] EMERGENCY DEPARTMENT Provider Note   CSN: [id number] Arrival date & time: 03/29/17  1203     History   Chief Complaint Chief Complaint  Patient presents with  . Altered Mental Status     HPI Jose [name] is a 83 y.o. male a history of CHF (EF 35-40%), overactive bladder, hyperlipidemia, unsteady gait, wheelchair-bound, dementia presents to ED for evaluation of altered mental status noticed this morning by a physical therapist at nursing/rehabilitation facility. Wife at bedside provides all of the history, states she was last with her husband at 59 PM yesterday, at that time he appeared to be at baseline. Has history of dementia but typically able to recognize family, alert to self, place and events. Wife reports thick clear rhinorrhea and dry cough since this morning. He is more somnolent. She denies obvious facial drooping or slurred speech, no history of TIA/CVA. No falls per her nursing facility staff. Pt is incontinent, unsure about urinary symptoms.   Level V caveat Given altered mental status. The patient is alert and oriented to self and family member only. He denies any pain.  HPI  Past Medical History:  Diagnosis Date  . Abnormality of gait   . Backache, unspecified   . CHF (congestive heart failure) ([place])   . Colon polyps    adenomatous  . Dementia 11/12/2012  . Depression   . Diabetes mellitus without complication ([place])   . Diverticulosis   . Essential and other specified forms of tremor   . Hemorrhoids   . History of bilateral hip replacements 11/12/2012  . Memory loss   . 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 Pain in joint, pelvic region and thigh   . Skin cancer of scalp   . Spinal stenosis, lumbar region, without neurogenic claudication   . Unspecified hereditary and idiopathic peripheral neuropathy     Patient Active Problem List   Diagnosis Date Noted  . Moderate protein-calorie  malnutrition ([place]) 03/06/2017  . Unsteady gait 03/06/2017  . Urinary incontinence 12/31/2016  . Oropharyngeal dysphagia 12/31/2016  . Dermatitis, seborrheic 12/14/2016  . Edema 12/14/2016  . Hyperlipidemia associated with type 2 diabetes mellitus ([place]) 12/06/2016  . Functional urinary incontinence 12/06/2016  . Chronic bilateral low back pain with bilateral sciatica 12/06/2016  . Chronic combined systolic and diastolic congestive heart failure ([place]) 12/06/2016  . Tachycardia 11/28/2016  . Major depression, recurrent, chronic ([place]) 11/23/2016  . OAB (overactive bladder) 11/23/2016  . Dermatitis 08/23/2016  . External hemorrhoid 07/26/2016  . Decubitus ulcer of buttock, stage 2 07/09/2016  . Depression with anxiety 07/09/2016  . Thrombocytopenia ([place]) 06/28/2016  . Rash 06/25/2016  . Urinary frequency 06/25/2016  . Closed left hip fracture, sequela 06/22/2016  . Type 2 diabetes mellitus with diabetic chronic kidney disease ([place]) 06/22/2016  . Hyperlipidemia LDL goal <70 06/22/2016  . Leukocytosis 06/22/2016  . Alzheimer's dementia without behavioral disturbance 11/12/2012  . History of bilateral hip replacements 11/12/2012    Past Surgical History:  Procedure Laterality Date  . CATARACT EXTRACTION, BILATERAL  2012  . JOINT REPLACEMENT    . RETINAL LASER PROCEDURE  2012   retinal wrinkle  . SKIN CANCER EXCISION    . TOTAL HIP ARTHROPLASTY Left 2000  . TOTAL HIP ARTHROPLASTY (aka REPLACEMENT) Right 2011       Home Medications    Prior to Admission medications   Medication Sig Start Date End Date Taking? Authorizing Provider  acetaminophen (TYLENOL) 500  MG tablet Take 500 mg by mouth every 8 (eight) hours as needed (pain).     [provider]  buPROPion (WELLBUTRIN SR) 100 MG 12 hr tablet Take 100 mg by mouth daily. 11/23/16   [provider]  carvedilol (COREG) 3.125 MG tablet Take 1 tablet (3.125 mg total) by mouth 2 (two) times daily with a meal. 11/30/16    Theodis Blaze, MD  Cholecalciferol (VITAMIN D) 1000 UNITS capsule Take 1,000 Units by mouth daily.      [provider]  dextromethorphan (DELSYM) 30 MG/5ML liquid Take 60 mg by mouth every 12 (twelve) hours as needed for cough. Take 10 mL    [provider]  donepezil (ARICEPT) 23 MG TABS tablet Take 1 tablet (23 mg total) by mouth daily. 10/19/15   Star Age, MD  dorzolamide-timolol (COSOPT) 22.3-6.8 MG/ML ophthalmic solution Place 1 drop into the left eye 2 (two) times daily.  09/20/12   [provider]  Emollient (CERAVE) CREA Apply 1 application topically daily. Apply to arms, legs and trunk    [provider]  Flaxseed, Linseed, (FLAXSEED OIL) 1000 MG CAPS Take 1,000 mg by mouth daily.      [provider]  folic acid (FOLVITE) 595 MCG tablet Take 800 mcg by mouth every evening.     [provider]  furosemide (LASIX) 20 MG tablet Take 1 tablet (20 mg total) by mouth daily. 12/01/16   Theodis Blaze, MD  hydrocortisone (PROCTOSOL HC) 2.5 % rectal cream Place 1 application rectally at bedtime as needed for hemorrhoids or anal itching.    [provider]  lisinopril (PRINIVIL,ZESTRIL) 5 MG tablet Take 1 tablet (5 mg total) by mouth daily. 12/01/16   Theodis Blaze, MD  loperamide (IMODIUM A-D) 2 MG tablet Take 2 mg by mouth 4 (four) times daily as needed for diarrhea or loose stools.     [provider]  memantine (NAMENDA XR) 28 MG CP24 24 hr capsule Take 1 capsule (28 mg total) by mouth daily. 09/27/15   Star Age, MD  metFORMIN (GLUCOPHAGE) 500 MG tablet Take 500 mg by mouth daily.  10/25/12   [provider]  mirabegron ER (MYRBETRIQ) 50 MG TB24 tablet Take 50 mg by mouth daily.    [provider]  Multiple Vitamin (MULTIVITAMIN) tablet Take 1 tablet by mouth daily.      [provider]  nystatin (NYSTATIN) powder Apply topically 2 (two) times daily as needed. Apply to redness and scrotal and  sacral area    [provider]  potassium chloride (KLOR-CON) 10 MEQ CR tablet Take 10 mEq by mouth 2 (two) times a week. On mondays and fridays    [provider]  Probiotic Product (ALIGN) 4 MG CAPS Take 4 mg by mouth daily.    [provider]  simvastatin (ZOCOR) 10 MG tablet Take 10 mg daily by mouth.    [provider]  ZINC OXIDE EX Apply 1 application topically 3 (three) times daily as needed.     [provider]    Family History Family History  Problem Relation Age of Onset  . Heart failure Mother     Social History Social History   Tobacco Use  . Smoking status: Former Smoker    Types: Cigarettes  . Smokeless tobacco: Never Used  Substance Use Topics  . Alcohol use: Yes    Alcohol/week: 8.4 oz    Types: 14 Glasses of wine per week  Comment: occas, one glass of wine before dinner,sometimes 1/2 glass  . Drug use: No     Allergies   Axona [bacid] and Gabapentin   Review of Systems Review of Systems  Unable to perform ROS: Mental status change  Constitutional: Positive for activity change and fever.  HENT: Positive for congestion and rhinorrhea.   Respiratory: Positive for cough.   Neurological:       +AMS     Physical Exam Updated Vital Signs BP 125/67 (BP Location: Right Arm)   Pulse (!) 114   Temp (!) 102.4 F (39.1 C) (Rectal)   Resp 20   Ht 6' (1.829 m)   Wt 80.3 kg (177 lb)   SpO2 93%   BMI 24.01 kg/m   Physical Exam  Constitutional: He is oriented to person, place, and time. He appears well-developed and well-nourished. No distress.  Asleep, alert to verbal stimulus.  HENT:  Head: Normocephalic and atraumatic.  Right Ear: External ear normal.  Left Ear: External ear normal.  Nose: Nose normal.  Moderate amount of clear rhinorrhea bilaterally, dripping down past mouth. Dry lips. Oropharynx and tonsils normal.  Eyes: Conjunctivae are normal. No scleral icterus.  2 cm pupils bilaterally. Unable  to assess EOM.  Neck: Normal range of motion. Neck supple.  Full PROM of neck and chin to chest w/o rigidity  Cardiovascular: Regular rhythm, normal heart sounds and intact distal pulses.  No murmur heard. Tachycardic. 2+ DP and radial pulses bilaterally. No lower extremity edema.  Pulmonary/Chest: Effort normal. Tachypnea noted. He has decreased breath sounds in the right lower field and the left lower field. He has no wheezes.  Decreased breath sounds to lower lobes. No rales. Tachypnic. Borderline hypoxic on room air.  Abdominal: Soft. There is no tenderness.  Soft, nondistended. No wincing with abdominal palpation.  Genitourinary:  Genitourinary Comments: Stool on diaper, no gross blood noted  Musculoskeletal: Normal range of motion. He exhibits no deformity.  Neurological: He is alert and oriented to person, place, and time.  Skin: Skin is warm and dry. Capillary refill takes less than 2 seconds.  No skin abnormalities to back, buttocks or lower extremities.  Psychiatric: He has a normal mood and affect. His behavior is normal. Judgment and thought content normal.  Nursing note and vitals reviewed.    ED Treatments / Results  Labs (all labs ordered are listed, but only abnormal results are displayed) Labs Reviewed  COMPREHENSIVE METABOLIC PANEL - Abnormal; Notable for the following components:      Result Value   Glucose, Bld 173 (*)    All other components within normal limits  CBC WITH DIFFERENTIAL/PLATELET - Abnormal; Notable for the following components:   WBC 13.9 (*)    Platelets 129 (*)    Neutro Abs 12.6 (*)    All other components within normal limits  I-STAT CG4 LACTIC ACID, ED - Abnormal; Notable for the following components:   Lactic Acid, Venous 2.24 (*)    All other components within normal limits  CULTURE, BLOOD (ROUTINE X 2)  CULTURE, BLOOD (ROUTINE X 2)  RESPIRATORY PANEL BY PCR  URINE CULTURE  URINALYSIS, ROUTINE W REFLEX MICROSCOPIC  I-STAT CG4 LACTIC  ACID, ED    EKG  EKG Interpretation None       Radiology Dg Chest Portable 1 View  Result Date: 03/29/2017 CLINICAL DATA:  Unresponsive.  Cough.  Fever. EXAM: PORTABLE CHEST 1 VIEW COMPARISON:  11/28/2016. FINDINGS: Mediastinum hilar structures normal. Cardiomegaly with normal pulmonary vascularity.  No focal infiltrate. No pleural effusion or pneumothorax. Degenerative change thoracic spine. IMPRESSION: Stable cardiomegaly .  No acute cardiopulmonary disease. Electronically Signed   By: Marcello Moores  Register   On: 03/29/2017 13:30    Procedures Procedures (including critical care time)  Medications Ordered in ED Medications  acetaminophen (TYLENOL) tablet 325 mg (325 mg Oral Not Given 03/29/17 1411)  ceFEPIme (MAXIPIME) 2 g in dextrose 5 % 50 mL IVPB (2 g Intravenous New Bag/Given 03/29/17 1419)  acetaminophen (TYLENOL) tablet 650 mg (650 mg Oral Given 03/29/17 1335)  sodium chloride 0.9 % bolus 1,000 mL (1,000 mLs Intravenous New Bag/Given 03/29/17 1417)  sodium chloride 0.9 % bolus 1,000 mL (0 mLs Intravenous Stopped 03/29/17 1423)  acetaminophen (TYLENOL) suppository 650 mg (650 mg Rectal Given 03/29/17 1422)     Initial Impression / Assessment and Plan / ED Course  I have reviewed the triage vital signs and the nursing notes.  Pertinent labs & imaging results that were available during my care of the patient were reviewed by me and considered in my medical decision making (see chart for details).  Clinical Course as of Mar 29 1424  Fri Mar 29, 2017  1314 Temp: (!) 102.4 F (39.1 C) [CG]  1314 Pulse Rate: (!) 106 [CG]  1314 Resp: (!) 22 [CG]  1314 WBC: (!) 13.9 [CG]  1314 Lactic Acid, Venous: (!!) 2.24 [CG]  1342 Lactic Acid, Venous: (!!) 2.24 [CG]  1342 WBC: (!) 13.9 [CG]    Clinical Course User Index [CG] Kinnie Feil, PA-C   83 year old male with history of dementia presents for altered mental status  On exam he is tachycardic, febrile, tachypneic and  borderline hypoxic on room air however he is very somnolent. He has moderate clear rhinorrhea from both sides and dry cough.  He is alert and oriented to self and family member this is different from baseline. No evidence of head trauma or cellulitis. Neuro exam unreliable due to altered mental status.  Lab work remarkable for elevated lactic acid and leukocytosis.  Portable chest x-ray without acute infiltrate.  Final Clinical Impressions(s) / ED Diagnoses   Spoke to hospitalist who will admit patient for IV antibiotics. High suspicion for respiratory etiology of sepsis, PCR viral respiratory panel and urinalysis pending. IV fluids and antibiotics given. Pt shared with SP who is agreeable with tx plan.  Final diagnoses:  None    ED Discharge Orders    None       Arlean Hopping 03/29/17 1425    Pattricia Boss, MD 04/01/17 Tresa Moore

## 2017-03-30 ENCOUNTER — Inpatient Hospital Stay (HOSPITAL_COMMUNITY): Payer: Medicare Other

## 2017-03-30 DIAGNOSIS — I5042 Chronic combined systolic (congestive) and diastolic (congestive) heart failure: Secondary | ICD-10-CM

## 2017-03-30 DIAGNOSIS — E785 Hyperlipidemia, unspecified: Secondary | ICD-10-CM

## 2017-03-30 DIAGNOSIS — D696 Thrombocytopenia, unspecified: Secondary | ICD-10-CM

## 2017-03-30 DIAGNOSIS — D72829 Elevated white blood cell count, unspecified: Secondary | ICD-10-CM

## 2017-03-30 DIAGNOSIS — F0280 Dementia in other diseases classified elsewhere without behavioral disturbance: Secondary | ICD-10-CM

## 2017-03-30 DIAGNOSIS — F418 Other specified anxiety disorders: Secondary | ICD-10-CM

## 2017-03-30 DIAGNOSIS — E1122 Type 2 diabetes mellitus with diabetic chronic kidney disease: Secondary | ICD-10-CM

## 2017-03-30 DIAGNOSIS — G309 Alzheimer's disease, unspecified: Secondary | ICD-10-CM

## 2017-03-30 LAB — GLUCOSE, CAPILLARY
Glucose-Capillary: 110 mg/dL — ABNORMAL HIGH (ref 65–99)
Glucose-Capillary: 128 mg/dL — ABNORMAL HIGH (ref 65–99)
Glucose-Capillary: 159 mg/dL — ABNORMAL HIGH (ref 65–99)

## 2017-03-30 LAB — COMPREHENSIVE METABOLIC PANEL
ALBUMIN: 2.8 g/dL — AB (ref 3.5–5.0)
ALK PHOS: 75 U/L (ref 38–126)
ALT: 17 U/L (ref 17–63)
AST: 20 U/L (ref 15–41)
Anion gap: 7 (ref 5–15)
BUN: 11 mg/dL (ref 6–20)
CALCIUM: 8.1 mg/dL — AB (ref 8.9–10.3)
CO2: 22 mmol/L (ref 22–32)
CREATININE: 1 mg/dL (ref 0.61–1.24)
Chloride: 110 mmol/L (ref 101–111)
GFR calc non Af Amer: >60 mL/min (ref >60)
GLUCOSE: 127 mg/dL — AB (ref 65–99)
Potassium: 3.3 mmol/L — ABNORMAL LOW (ref 3.5–5.1)
SODIUM: 139 mmol/L (ref 135–145)
Total Bilirubin: 0.8 mg/dL (ref 0.3–1.2)
Total Protein: 5.3 g/dL — ABNORMAL LOW (ref 6.5–8.1)

## 2017-03-30 LAB — CBC
HCT: 34.3 % — ABNORMAL LOW (ref 39.0–52.0)
Hemoglobin: 11.4 g/dL — ABNORMAL LOW (ref 13.0–17.0)
MCH: 32.7 pg (ref 26.0–34.0)
MCHC: 33.2 g/dL (ref 30.0–36.0)
MCV: 98.3 fL (ref 78.0–100.0)
PLATELETS: 100 10*3/uL — AB (ref 150–400)
RBC: 3.49 MIL/uL — ABNORMAL LOW (ref 4.22–5.81)
RDW: 13.6 % (ref 11.5–15.5)
WBC: 6.4 10*3/uL (ref 4.0–10.5)

## 2017-03-30 LAB — URINE CULTURE: CULTURE: NO GROWTH

## 2017-03-30 LAB — MRSA PCR SCREENING: MRSA by PCR: NEGATIVE

## 2017-03-30 LAB — PROTIME-INR
INR: 1.15
PROTHROMBIN TIME: 14.6 s (ref 11.4–15.2)

## 2017-03-30 MED ORDER — PIPERACILLIN-TAZOBACTAM 3.375 G IVPB
3.3750 g | Freq: Three times a day (TID) | INTRAVENOUS | Status: DC
  Administered 2017-03-30 – 2017-04-01 (×5): 3.375 g via INTRAVENOUS
  Filled 2017-03-30 (×7): qty 50

## 2017-03-30 MED ORDER — POTASSIUM CHLORIDE CRYS ER 20 MEQ PO TBCR
40.0000 meq | EXTENDED_RELEASE_TABLET | Freq: Two times a day (BID) | ORAL | Status: AC
  Administered 2017-03-30 (×2): 40 meq via ORAL
  Filled 2017-03-30 (×2): qty 2

## 2017-03-30 MED ORDER — RESOURCE THICKENUP CLEAR PO POWD
ORAL | Status: DC | PRN
  Filled 2017-03-30: qty 125

## 2017-03-30 NOTE — Progress Notes (Signed)
Pharmacy Antibiotic Note  Jose [name] is a 83 y.o. male admitted on 03/29/2017 with pneumonia.  Pharmacy has been consulted for zosyn dosing. Pt here with increased confusion. WBC elevated on admission at 13.9, trending down to 6.4. LA 2.24 on admission now 1.3. Tmax 100.8. SCr stable ~1.0.    Plan: -Zosyn 3.375g IV q8h  -Monitor CBC, renal fx, cultures, clinical progress, and LOT    Height: 6' (182.9 cm) Weight: 177 lb 7.5 oz (80.5 kg) IBW/kg (Calculated) : 77.6  Temp (24hrs), Avg:99.6 F (37.6 C), Min:98.4 F (36.9 C), Max:100.8 F (38.2 C)  Recent Labs  Lab 03/29/17 1240 03/29/17 1254 03/29/17 1439 03/29/17 1719 03/30/17 0337  WBC 13.9*  --   --   --  6.4  CREATININE 1.09  --   --   --  1.00  LATICACIDVEN  --  2.24* 1.8 1.3  --     Estimated Creatinine Clearance: 60.4 mL/min (by C-G formula based on SCr of 1 mg/dL).    Allergies  Allergen Reactions  . Axona [Bacid]     Unknown per MAR  . Gabapentin     Hallucinations    Antimicrobials this admission: Vanc 12/28>>12/29 Cefepime 12/28>>12/29 Metronidazole 12/28>>12/29 Zosyn 12/29>>  Dose adjustments this admission: None   Microbiology results: 12/29 MRSA PCR: negative  12/28 Bcx: NGTD 12/28 Ucx: NG final  12/28 resp panel: coronavirus detected, all others neg  Thank you for allowing pharmacy to be a part of this patient's care.  Jalene Mullet, Pharm.D. PGY1 Pharmacy Resident 03/30/2017 1:07 PM Main Pharmacy: [phone]

## 2017-03-30 NOTE — Progress Notes (Signed)
OT Cancellation Note  Patient Details Name: Jose [name] MRN: [id number] DOB: 07-May-1930   Cancelled Treatment:    Reason Eval/Treat Not Completed: Patient at procedure or test/ unavailable;  attempted to see pt, currently receiving breathing tx and transporter here to take for swallow eval.  Will attempt later as schedule permits for OT evaluation.    [name] 03/30/2017, 2:24 PM  Hulda Humphrey OTR/L [phone]

## 2017-03-30 NOTE — Progress Notes (Signed)
Modified Barium Swallow Progress Note  Patient Details  Name: Jose [name] MRN: [id number] Date of Birth: 1930-08-31  Today's Date: 03/30/2017  Modified Barium Swallow completed.  Full report located under Chart Review in the Imaging Section.  Brief recommendations include the following:  Clinical Impression  Patient presents with mild oropharyngeal dysphagia. Oral stage mildly prolonged with weak lingual manipulation. Swallow initiation delayed to valleculae with all consistencies. Pt naturally swallows in chin tuck position which does appear to facilitate containment of smaller boluses in the vallecuale prior to swallow initiation. Of note, pt did have coughing episode during the exam which was not associated with penetration or aspiration.Trace shallow penetration of thin with multiple consecutive straw sips. Pharyngeal stage characterized by reduced tongue base retraction, leading to mild residue in the valleculae with puree, thin liquids. Moderate residue with solids. With greater residue in the valleculae, subsequent deep penetration to vocal cords x1 and trace silent aspiration x1 with larger boluses of thin liquid wash, and penetration of nectar x1. Cued cough was not effective for clearance. Pt does tolerate small sips of thin liquid without airway compromise, particularly with less residue in the valleculae. Recommend dys 1, thin liquids via small cup sips, meds whole in puree. Will f/u for tolerance, education.   Swallow Evaluation Recommendations       SLP Diet Recommendations: Dysphagia 1 (Puree) solids;Thin liquid   Liquid Administration via: Cup;No straw   Medication Administration: Whole meds with puree   Supervision: Full supervision/cueing for compensatory strategies   Compensations: Minimize environmental distractions;Slow rate;Small sips/bites       Oral Care Recommendations: Oral care BID     Deneise Lever, [place], CCC-SLP Speech-Language  Pathologist [place] 03/30/2017,3:44 PM

## 2017-03-30 NOTE — Progress Notes (Signed)
PT Cancellation Note  Patient Details Name: Jose [name] MRN: [id number] DOB: 1931-03-29   Cancelled Treatment:    Reason Eval/Treat Not Completed: Patient at procedure or test/unavailable; attempted to see pt, currently receiving breathing tx and transporter here to take for swallow eval.  Will attempt later as time permits.   Reginia Naas 03/30/2017, 2:14 PM  Magda Kiel, [place] 03/30/2017

## 2017-03-30 NOTE — NC FL2 (Signed)
[place] LEVEL OF CARE SCREENING TOOL     IDENTIFICATION  Patient Name: Jose [name] Birthdate: 1931/03/30 Sex: male Admission Date (Current Location): 03/29/2017  [facility] and [place] Number:  Herbalist and Address:  [facility], [place] [address], [place], [place] [address]      Provider Number: [id number]  Attending Physician Name and Address:  Caren Griffins, MD  Relative Name and Phone Number:  [name], spouse, [phone]    Current Level of Care: Hospital Recommended Level of Care: [place] Prior Approval Number:    Date Approved/Denied:   PASRR Number:    Discharge Plan: SNF    Current Diagnoses: Patient Active Problem List   Diagnosis Date Noted  . Sepsis ([place]) 03/29/2017  . Moderate protein-calorie malnutrition ([place]) 03/06/2017  . Unsteady gait 03/06/2017  . Urinary incontinence 12/31/2016  . Oropharyngeal dysphagia 12/31/2016  . Dermatitis, seborrheic 12/14/2016  . Edema 12/14/2016  . Hyperlipidemia associated with type 2 diabetes mellitus ([place]) 12/06/2016  . Functional urinary incontinence 12/06/2016  . Chronic bilateral low back pain with bilateral sciatica 12/06/2016  . Chronic combined systolic and diastolic congestive heart failure ([place]) 12/06/2016  . Tachycardia 11/28/2016  . Major depression, recurrent, chronic ([place]) 11/23/2016  . OAB (overactive bladder) 11/23/2016  . Dermatitis 08/23/2016  . External hemorrhoid 07/26/2016  . Decubitus ulcer of buttock, stage 2 07/09/2016  . Depression with anxiety 07/09/2016  . Thrombocytopenia ([place]) 06/28/2016  . Rash 06/25/2016  . Urinary frequency 06/25/2016  . Closed left hip fracture, sequela 06/22/2016  . Type 2 diabetes mellitus with diabetic chronic kidney disease ([place]) 06/22/2016  . Hyperlipidemia LDL goal <70 06/22/2016  . Leukocytosis 06/22/2016  . Alzheimer's dementia without behavioral disturbance 11/12/2012  . History of bilateral  hip replacements 11/12/2012    Orientation RESPIRATION BLADDER Height & Weight     Self  Normal Continent, External catheter Weight: 80.5 kg (177 lb 7.5 oz) Height:  6' (182.9 cm)  BEHAVIORAL SYMPTOMS/MOOD NEUROLOGICAL BOWEL NUTRITION STATUS      Continent Diet(Please see DC Summary)  AMBULATORY STATUS COMMUNICATION OF NEEDS Skin   Extensive Assist Verbally Normal                       Personal Care Assistance Level of Assistance  Bathing, Feeding, Dressing Bathing Assistance: Maximum assistance Feeding assistance: Limited assistance Dressing Assistance: Limited assistance     Functional Limitations Info             SPECIAL CARE FACTORS FREQUENCY  PT (By licensed PT), OT (By licensed OT), Speech therapy     PT Frequency: 5x/week OT Frequency: 3x/week     Speech Therapy Frequency: 2x/week      Contractures      Additional Factors Info  Code Status, Allergies, Psychotropic, Insulin Sliding Scale Code Status Info: Full Allergies Info: Axona Bacid, Gabapentin Psychotropic Info: Wellbutrin Insulin Sliding Scale Info: 3x dialy with meals        Current Medications (03/30/2017):  This is the current hospital active medication list Current Facility-Administered Medications  Medication Dose Route Frequency Provider Last Rate Last Dose  . acetaminophen (TYLENOL) suppository 650 mg  650 mg Rectal Q6H PRN Irene Pap N, DO   650 mg at 03/30/17 1220  . bisacodyl (DULCOLAX) suppository 10 mg  10 mg Rectal Daily PRN Rondel Jumbo, PA-C      . buPROPion [facility] SR) 12 hr tablet 100 mg  100  mg Oral Daily Rondel Jumbo, PA-C   100 mg at 03/30/17 1018  . carvedilol (COREG) tablet 3.125 mg  3.125 mg Oral BID WC Wertman, Sara E, PA-C   3.125 mg at 03/30/17 1016  . dextromethorphan (DELSYM) 30 MG/5ML liquid 60 mg  60 mg Oral Q12H PRN Rondel Jumbo, PA-C      . dorzolamide-timolol (COSOPT) 22.3-6.8 MG/ML ophthalmic solution 1 drop  1 drop Left Eye BID Rondel Jumbo, PA-C   1 drop at 44/03/47 4259  . folic acid (FOLVITE) tablet 1 mg  1 mg Oral Daily Irene Pap N, DO   1 mg at 03/30/17 1018  . heparin injection 5,000 Units  5,000 Units Subcutaneous Q8H Rondel Jumbo, PA-C   5,000 Units at 03/30/17 1337  . hydrALAZINE (APRESOLINE) injection 5 mg  5 mg Intravenous Q8H PRN Rondel Jumbo, PA-C      . hydrocerin (EUCERIN) cream   Topical Daily Irene Pap N, DO      . hydrocortisone (ANUSOL-HC) 2.5 % rectal cream 1 application  1 application Rectal QHS PRN Rondel Jumbo, PA-C      . insulin aspart (novoLOG) injection 0-9 Units  0-9 Units Subcutaneous TID WC Rondel Jumbo, PA-C   1 Units at 03/30/17 1214  . ipratropium-albuterol (DUONEB) 0.5-2.5 (3) MG/3ML nebulizer solution 3 mL  3 mL Nebulization Q2H PRN Hall, Carole N, DO      . ipratropium-albuterol (DUONEB) 0.5-2.5 (3) MG/3ML nebulizer solution 3 mL  3 mL Nebulization TID Irene Pap N, DO   3 mL at 03/30/17 1401  . loperamide (IMODIUM) capsule 2 mg  2 mg Oral QID PRN Kayleen Memos, DO      . mirabegron ER (MYRBETRIQ) tablet 50 mg  50 mg Oral Daily Sharene Butters E, PA-C   50 mg at 03/30/17 1019  . ondansetron (ZOFRAN) tablet 4 mg  4 mg Oral Q6H PRN Rondel Jumbo, PA-C       Or  . ondansetron [facility]) injection 4 mg  4 mg Intravenous Q6H PRN Rondel Jumbo, PA-C      . piperacillin-tazobactam (ZOSYN) IVPB 3.375 g  3.375 g Intravenous Q8H Jalene Mullet, RPH 12.5 mL/hr at 03/30/17 1337 3.375 g at 03/30/17 1337  . potassium chloride SA (K-DUR,KLOR-CON) CR tablet 40 mEq  40 mEq Oral BID Caren Griffins, MD   40 mEq at 03/30/17 1018  . RESOURCE THICKENUP CLEAR   Oral PRN Caren Griffins, MD      . saccharomyces boulardii (FLORASTOR) capsule 250 mg  250 mg Oral BID Irene Pap N, DO   250 mg at 03/30/17 1018  . senna-docusate (Senokot-S) tablet 1 tablet  1 tablet Oral QHS PRN Rondel Jumbo, PA-C      . simvastatin (ZOCOR) tablet 5 mg  5 mg Oral Daily Rondel Jumbo, PA-C   5 mg at 03/30/17  1017     Discharge Medications: Please see discharge summary for a list of discharge medications.  Relevant Imaging Results:  Relevant Lab Results:   Additional Information SS# [id number]  Benard Halsted, LCSWA

## 2017-03-30 NOTE — Progress Notes (Signed)
PROGRESS NOTE  Jose [name] RWE:[id number] DOB: 1931/03/25 DOA: 03/29/2017 PCP: [name], MD   LOS: 1 day   Brief Narrative / Interim history: 83 y.o. male Chronic systolic and diastolic CHF, EF 61-95%, overactive bladder, hyperlipidemia, hypertension, unsteady gait-wheelchair-bound, dementia, brought to the ED for evaluation of altered mental status noticed by his physical therapy at the nursing-rehab facility.  Patient has been having URI type symptoms.  Assessment & Plan: Active Problems:   Alzheimer's dementia without behavioral disturbance   Type 2 diabetes mellitus with diabetic chronic kidney disease (HCC)   Hyperlipidemia LDL goal <70   Leukocytosis   Urinary frequency   Thrombocytopenia (HCC)   Depression with anxiety   Major depression, recurrent, chronic (HCC)   Chronic bilateral low back pain with bilateral sciatica   Chronic combined systolic and diastolic congestive heart failure ([place])   Unsteady gait   Sepsis ([place])   Sepsis due to URI -Still febrile and tachypneic this morning, sepsis physiology seems to persist -Patient's respiratory virus panel positive for coronavirus, cannot fully exclude a component of aspiration given underlying dementia, transition antibiotics from vancomycin and cefepime to Zosyn. -Seems to be improved this morning per daughter who is at bedside -Continue to monitor cultures, so far negative  Chronic combined systolic and diastolic CHF -Patient appears euvolemic this morning, stop IV fluids, allow diet as per speech therapy is evaluating patient -Continue Coreg, hold lisinopril  Type 2 diabetes mellitus -Continue with sliding scale  Overactive bladder -Continue home medications  Hypertension -Hold lisnopril due to soft blood pressure today  Hyperlipidemia -Continue statins  Alzheimer's dementia -Continue home medications  Thrombocytopenia -Monitor   DVT prophylaxis: Heparin Code Status: Full code Family  Communication: Discussed with daughter at bedside Disposition Plan: Back to SNF when medically stable  Consultants:   None  Procedures:   None   Antimicrobials:  Zosyn 12/29 >>  Vancomycin/Cefepime/Flagyl 12/28 >> 12/29   Subjective: -Underlying dementia, he has no complaints for me  Objective: Vitals:   03/30/17 1016 03/30/17 1100 03/30/17 1216 03/30/17 1220  BP: (!) 134/59  97/63   Pulse: 74  83   Resp:   (!) 21   Temp:  (!) 100.8 F (38.2 C)  (!) 100.6 F (38.1 C)  TempSrc:  Axillary  Oral  SpO2:   96%   Weight:      Height:        Intake/Output Summary (Last 24 hours) at 03/30/2017 1235 Last data filed at 03/30/2017 0900 Gross per 24 hour  Intake 2901.25 ml  Output 375 ml  Net 2526.25 ml   Filed Weights   03/29/17 1220 03/29/17 1643 03/30/17 0428  Weight: 80.3 kg (177 lb) 80.9 kg (178 lb 4.8 oz) 80.5 kg (177 lb 7.5 oz)    Examination:  Constitutional: NAD, confused Eyes: lids and conjunctivae normal ENMT: Mucous membranes are dry Neck: normal, supple Respiratory: Decreased breath sounds on anterior auscultation, no wheezing, no crackles.  Faint rhonchi at the bases  Cardiovascular: Regular rate and rhythm, no murmurs / rubs / gallops. No LE edema. Abdomen: no tenderness. Bowel sounds positive.  Skin: no rashes, lesions, ulcers. No induration Neurologic: Grossly nonfocal Psychiatric: Alert to person only   Data Reviewed: I have independently reviewed following labs and imaging studies  CBC: Recent Labs  Lab 03/29/17 1240 03/30/17 0337  WBC 13.9* 6.4  NEUTROABS 12.6*  --   HGB 14.3 11.4*  HCT 43.0 34.3*  MCV 96.2 98.3  PLT 129* 100*   Basic  Metabolic Panel: Recent Labs  Lab 03/29/17 1240 03/30/17 0337  NA 136 139  K 3.6 3.3*  CL 103 110  CO2 25 22  GLUCOSE 173* 127*  BUN 10 11  CREATININE 1.09 1.00  CALCIUM 9.0 8.1*   GFR: Estimated Creatinine Clearance: 60.4 mL/min (by C-G formula based on SCr of 1 mg/dL). Liver Function  Tests: Recent Labs  Lab 03/29/17 1240 03/30/17 0337  AST 25 20  ALT 25 17  ALKPHOS 113 75  BILITOT 0.6 0.8  PROT 6.8 5.3*  ALBUMIN 3.8 2.8*   No results for input(s): LIPASE, AMYLASE in the last 168 hours. No results for input(s): AMMONIA in the last 168 hours. Coagulation Profile: Recent Labs  Lab 03/30/17 0337  INR 1.15   Cardiac Enzymes: No results for input(s): CKTOTAL, CKMB, CKMBINDEX, TROPONINI in the last 168 hours. BNP (last 3 results) Recent Labs    12/18/16 1503 03/04/17 1520  PROBNP 1,931* 1,249*   HbA1C: Recent Labs    03/29/17 1439  HGBA1C 6.5*   CBG: Recent Labs  Lab 03/29/17 1651 03/29/17 2144 03/30/17 0814 03/30/17 1200  GLUCAP 123* 133* 110* 128*   Lipid Profile: No results for input(s): CHOL, HDL, LDLCALC, TRIG, CHOLHDL, LDLDIRECT in the last 72 hours. Thyroid Function Tests: No results for input(s): TSH, T4TOTAL, FREET4, T3FREE, THYROIDAB in the last 72 hours. Anemia Panel: No results for input(s): VITAMINB12, FOLATE, FERRITIN, TIBC, IRON, RETICCTPCT in the last 72 hours. Urine analysis:    Component Value Date/Time   COLORURINE YELLOW 03/29/2017 [place] 03/29/2017 1247   LABSPEC 1.014 03/29/2017 1247   PHURINE 6.0 03/29/2017 1247   GLUCOSEU NEGATIVE 03/29/2017 1247   HGBUR NEGATIVE 03/29/2017 1247   BILIRUBINUR NEGATIVE 03/29/2017 1247   KETONESUR 5 (A) 03/29/2017 1247   PROTEINUR NEGATIVE 03/29/2017 1247   UROBILINOGEN 0.2 06/24/2014 2112   NITRITE NEGATIVE 03/29/2017 1247   LEUKOCYTESUR NEGATIVE 03/29/2017 1247   Sepsis Labs: Invalid input(s): PROCALCITONIN, LACTICIDVEN  Recent Results (from the past 240 hour(s))  Blood Culture (routine x 2)     Status: None (Preliminary result)   Collection Time: 03/29/17 12:40 PM  Result Value Ref Range Status   Specimen Description BLOOD LEFT ANTECUBITAL  Final   Special Requests   Final    BOTTLES DRAWN AEROBIC AND ANAEROBIC Blood Culture adequate volume   Culture  NO GROWTH < 24 HOURS  Final   Report Status PENDING  Incomplete  Urine culture     Status: None   Collection Time: 03/29/17 12:47 PM  Result Value Ref Range Status   Specimen Description URINE, RANDOM  Final   Special Requests NONE  Final   Culture NO GROWTH  Final   Report Status 03/30/2017 FINAL  Final  Blood Culture (routine x 2)     Status: None (Preliminary result)   Collection Time: 03/29/17  1:02 PM  Result Value Ref Range Status   Specimen Description BLOOD RIGHT FOREARM  Final   Special Requests   Final    BOTTLES DRAWN AEROBIC ONLY Blood Culture adequate volume   Culture NO GROWTH < 24 HOURS  Final   Report Status PENDING  Incomplete  Respiratory Panel by PCR     Status: Abnormal   Collection Time: 03/29/17  1:10 PM  Result Value Ref Range Status   Adenovirus NOT DETECTED NOT DETECTED Final   Coronavirus 229E NOT DETECTED NOT DETECTED Final   Coronavirus HKU1 NOT DETECTED NOT DETECTED Final   Coronavirus NL63 NOT  DETECTED NOT DETECTED Final   Coronavirus OC43 DETECTED (A) NOT DETECTED Final   Metapneumovirus NOT DETECTED NOT DETECTED Final   Rhinovirus / Enterovirus NOT DETECTED NOT DETECTED Final   Influenza A NOT DETECTED NOT DETECTED Final   Influenza B NOT DETECTED NOT DETECTED Final   Parainfluenza Virus 1 NOT DETECTED NOT DETECTED Final   Parainfluenza Virus 2 NOT DETECTED NOT DETECTED Final   Parainfluenza Virus 3 NOT DETECTED NOT DETECTED Final   Parainfluenza Virus 4 NOT DETECTED NOT DETECTED Final   Respiratory Syncytial Virus NOT DETECTED NOT DETECTED Final   Bordetella pertussis NOT DETECTED NOT DETECTED Final   Chlamydophila pneumoniae NOT DETECTED NOT DETECTED Final   Mycoplasma pneumoniae NOT DETECTED NOT DETECTED Final  MRSA PCR Screening     Status: None   Collection Time: 03/30/17  7:36 AM  Result Value Ref Range Status   MRSA by PCR NEGATIVE NEGATIVE Final    Comment:        The GeneXpert MRSA Assay (FDA approved for NASAL specimens only), is  one component of a comprehensive MRSA colonization surveillance program. It is not intended to diagnose MRSA infection nor to guide or monitor treatment for MRSA infections.       Radiology Studies: Dg Chest Port 1 View  Result Date: 03/30/2017 CLINICAL DATA:  Sepsis.  Cough.  Shortness breath. EXAM: PORTABLE CHEST 1 VIEW COMPARISON:  One-view chest x-ray 03/29/2017 FINDINGS: The heart size is exaggerate by low lung volumes. Lungs are clear. The visualized soft tissues and bony thorax are unremarkable. IMPRESSION: Stable low lung volumes and cardiomegaly without acute cardiopulmonary disease. Electronically Signed   By: [place] M.D.   On: 03/30/2017 10:17   Dg Chest Portable 1 View  Result Date: 03/29/2017 CLINICAL DATA:  Unresponsive.  Cough.  Fever. EXAM: PORTABLE CHEST 1 VIEW COMPARISON:  11/28/2016. FINDINGS: Mediastinum hilar structures normal. Cardiomegaly with normal pulmonary vascularity. No focal infiltrate. No pleural effusion or pneumothorax. Degenerative change thoracic spine. IMPRESSION: Stable cardiomegaly .  No acute cardiopulmonary disease. Electronically Signed   By: [place]   On: 03/29/2017 13:30     Scheduled Meds: . buPROPion  100 mg Oral Daily  . carvedilol  3.125 mg Oral BID WC  . dorzolamide-timolol  1 drop Left Eye BID  . folic acid  1 mg Oral Daily  . heparin  5,000 Units Subcutaneous Q8H  . hydrocerin   Topical Daily  . insulin aspart  0-9 Units Subcutaneous TID WC  . ipratropium-albuterol  3 mL Nebulization TID  . lisinopril  5 mg Oral Daily  . mirabegron ER  50 mg Oral Daily  . potassium chloride  40 mEq Oral BID  . saccharomyces boulardii  250 mg Oral BID  . simvastatin  5 mg Oral Daily   Continuous Infusions: . ceFEPime (MAXIPIME) IV Stopped (03/30/17 0551)  . metronidazole Stopped (03/30/17 1140)  . vancomycin Stopped (03/30/17 0420)    Marzetta Board, MD, PhD [facility] Pager [phone]  If 7PM-7AM,  please contact night-coverage [email or web address] Password [facility] 03/30/2017, 12:35 PM

## 2017-03-30 NOTE — Evaluation (Addendum)
Clinical/Bedside Swallow Evaluation Patient Details  Name: Jose [name] MRN: [id number] Date of Birth: [birth year]  Today's Date: 03/30/2017 Time: SLP Start Time (ACUTE ONLY): 0950 SLP Stop Time (ACUTE ONLY): 1015 SLP Time Calculation (min) (ACUTE ONLY): 25 min  Past Medical History:  Past Medical History:  Diagnosis Date  . Abnormality of gait   . Backache, unspecified   . CHF (congestive heart failure) ([place])   . Colon polyps    adenomatous  . Dementia 11/12/2012  . Depression   . Diabetes mellitus without complication ([place])   . Diverticulosis   . Essential and other specified forms of tremor   . Hemorrhoids   . History of bilateral hip replacements 11/12/2012  . Memory loss   . 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 Pain in joint, pelvic region and thigh   . Skin cancer of scalp   . Spinal stenosis, lumbar region, without neurogenic claudication   . Unspecified hereditary and idiopathic peripheral neuropathy    Past Surgical History:  Past Surgical History:  Procedure Laterality Date  . CATARACT EXTRACTION, BILATERAL  2012  . JOINT REPLACEMENT    . RETINAL LASER PROCEDURE  2012   retinal wrinkle  . SKIN CANCER EXCISION    . TOTAL HIP ARTHROPLASTY Left 2000  . TOTAL HIP ARTHROPLASTY (aka REPLACEMENT) Right 2011   HPI:  Patient is an 83  y.o male with chronic systolic and diastolic CHF, overactive bladder, hyperlipidemia, HTN, unsteady gait, wheelchair bound, dementia, who was brought to ED from SNF after concern for AMS noted by PT     Assessment / Plan / Recommendation Clinical Impression  Patient presents with a mild oropharyngeal dysphagia characterized by swallow initiation delays with all tested boluses, delayed coughing with thin liquids, and observation of patient masticating when eating puree solids Unable to r/o aspiration or penetration, as patient also with pharyngeal secretions premorbidly, which he has been unable to  expectorate.  SLP Visit Diagnosis: Dysphagia, oropharyngeal phase (R13.12)    Aspiration Risk  Mild aspiration risk    Diet Recommendation Dysphagia 1 (Puree);Nectar-thick liquid   Liquid Administration via: Cup;No straw Medication Administration: Whole meds with puree Supervision: Full supervision/cueing for compensatory strategies;Staff to assist with self feeding Compensations: Minimize environmental distractions;Slow rate;Small sips/bites Postural Changes: Seated upright at 90 degrees    Other  Recommendations Oral Care Recommendations: Oral care BID   Follow up Recommendations Skilled Nursing facility      Frequency and Duration min 2x/week  1 week       Prognosis Prognosis for Safe Diet Advancement: Good      Swallow Study   General      Oral/Motor/Sensory Function Overall Oral Motor/Sensory Function: Other (comment)(lingual tremor noted when patient completing ROM with tongue)   Ice Chips     Thin Liquid Thin Liquid: Impaired Presentation: Cup Pharyngeal  Phase Impairments: Suspected delayed Swallow;Cough - Delayed    Nectar Thick Nectar Thick Liquid: Not tested   Honey Thick Honey Thick Liquid: Not tested   Puree Puree: Impaired Presentation: Spoon Oral Phase Impairments: Impaired mastication;Other (comment)(patient exhibited chewing of purees)   Solid   GO   Solid: Not tested        Sonia Baller, MA, CCC-SLP 03/30/17 1:44 PM

## 2017-03-30 NOTE — Evaluation (Signed)
Occupational Therapy Evaluation Patient Details Name: Jose [name] MRN: [id number] DOB: 1930-08-14 Today's Date: 03/30/2017    History of Present Illness Jose [name] is a 82 y.o. male Chronic systolic and diastolic CHF, EF 86-57%, overactive bladder, hyperlipidemia, hypertension, unsteady gait-wheelchair-bound, dementia, brought to the ED for evaluation of altered mental status noticed by his physical therapist at the nursing-rehab facility.    Clinical Impression   PTA Pt at [facility] in SNF - working on walking down hall with RW with PT; however baseline was assist for ADL and wc at baseline (but improving). Pt is currently overall min A for grooming tasks in supported seated position and max A to total A for LB ADL. Attempted sit to stand x2 with max A from OT and RW and unsuccessful in achieving upright position. Will require +2 assist for transfers. Pt will benefit from skilled OT in the acute setting and afterwards should return to SNF for continued therapy maximizing safety and independence in ADL and functional transfers.     Follow Up Recommendations  SNF;Supervision/Assistance - 24 hour    Equipment Recommendations  Other (comment)(defer to next venue)    Recommendations for Other Services       Precautions / Restrictions Precautions Precautions: Fall Restrictions Weight Bearing Restrictions: No      Mobility Bed Mobility Overal bed mobility: Needs Assistance Bed Mobility: Supine to Sit;Sit to Supine     Supine to sit: Mod assist;HOB elevated(assist for trunk elevation and use of bed pad hips EOB) Sit to supine: Max assist(asssist for BLE back into bed and positioning once supine)      Transfers Overall transfer level: Needs assistance Equipment used: Rolling walker (2 wheeled) Transfers: Sit to/from Stand Sit to Stand: Max assist;+2 physical assistance;+2 safety/equipment;From elevated surface         General transfer comment: attempted sit to stand  x2 with max A from OT - unable to achieve more than 1 inch off the bed    Balance Overall balance assessment: Needs assistance Sitting-balance support: Single extremity supported;Feet supported Sitting balance-Leahy Scale: Poor Sitting balance - Comments: unable to maintain upright without external support Postural control: Left lateral lean                                 ADL either performed or assessed with clinical judgement   ADL Overall ADL's : Needs assistance/impaired Eating/Feeding: Set up   Grooming: Wash/dry hands;Wash/dry face;Set up;Sitting Grooming Details (indicate cue type and reason): EOB Upper Body Bathing: Moderate assistance   Lower Body Bathing: Maximal assistance   Upper Body Dressing : Moderate assistance   Lower Body Dressing: Total assistance   Toilet Transfer: Maximal assistance;+2 for physical assistance;+2 for safety/equipment   Toileting- Clothing Manipulation and Hygiene: Total assistance               Vision Baseline Vision/History: Wears glasses Wears Glasses: At all times Patient Visual Report: No change from baseline(no glasses present - Pt denies visual changes)       Perception     Praxis      Pertinent Vitals/Pain Pain Assessment: No/denies pain(has chronic back pain)     Hand Dominance Right   Extremity/Trunk Assessment Upper Extremity Assessment Upper Extremity Assessment: Generalized weakness   Lower Extremity Assessment Lower Extremity Assessment: Generalized weakness   Cervical / Trunk Assessment Cervical / Trunk Assessment: Other exceptions(pinched nerve in lumbar area)   Communication  Communication Communication: HOH   Cognition Arousal/Alertness: Awake/alert Behavior During Therapy: Flat affect Overall Cognitive Status: Impaired/Different from baseline Area of Impairment: Following commands;Memory;Problem solving                     Memory: Decreased short-term memory Following  Commands: Follows one step commands with increased time     Problem Solving: Slow processing;Decreased initiation;Requires verbal cues;Requires tactile cues     General Comments  Wife Jose [name] present for entire session. Very supportive and sweet.    Exercises     Shoulder Instructions      Home Living Family/patient expects to be discharged to:: Skilled nursing facility                                 Additional Comments: [facility]      Prior Functioning/Environment Level of Independence: Needs assistance  Gait / Transfers Assistance Needed: working with PT-ambulating with RW ADL's / Homemaking Assistance Needed: assist for all ADL (bathing/dressing) Communication / Swallowing Assistance Needed: soft wet voice this session          OT Problem List: Decreased strength;Decreased range of motion;Decreased activity tolerance;Impaired balance (sitting and/or standing);Decreased cognition;Decreased safety awareness;Decreased knowledge of use of DME or AE      OT Treatment/Interventions: Self-care/ADL training;Therapeutic exercise;Energy conservation;DME and/or AE instruction;Therapeutic activities;Cognitive remediation/compensation;Patient/family education;Balance training    OT Goals(Current goals can be found in the care plan section) Acute Rehab OT Goals Patient Stated Goal: get to sleep tonight OT Goal Formulation: With patient Time For Goal Achievement: 04/13/17 Potential to Achieve Goals: Good ADL Goals Pt Will Perform Grooming: with min guard assist;with caregiver independent in assisting;sitting Pt Will Transfer to Toilet: with max assist;stand pivot transfer;bedside commode Pt Will Perform Toileting - Clothing Manipulation and hygiene: with max assist;with caregiver independent in assisting;sit to/from stand Additional ADL Goal #1: Pt will demonstrate ability to follow multi-step directions 50% of the time  OT Frequency: Min 2X/week   Barriers to  D/C:            Co-evaluation              AM-PAC PT "6 Clicks" Daily Activity     Outcome Measure Help from another person eating meals?: A Little Help from another person taking care of personal grooming?: A Lot Help from another person toileting, which includes using toliet, bedpan, or urinal?: Total Help from another person bathing (including washing, rinsing, drying)?: A Lot Help from another person to put on and taking off regular upper body clothing?: A Lot Help from another person to put on and taking off regular lower body clothing?: Total 6 Click Score: 11   End of Session Equipment Utilized During Treatment: Gait belt;Rolling walker Nurse Communication: Mobility status  Activity Tolerance: Patient limited by fatigue Patient left: in bed;with call bell/phone within reach;with bed alarm set;with family/visitor present  OT Visit Diagnosis: Unsteadiness on feet (R26.81);Other abnormalities of gait and mobility (R26.89);Muscle weakness (generalized) (M62.81);Other symptoms and signs involving cognitive function                Time: 1710-1735 OT Time Calculation (min): 25 min Charges:  OT General Charges $OT Visit: 1 Visit OT Evaluation $OT Eval Moderate Complexity: 1 Mod OT Treatments $Self Care/Home Management : 8-22 mins G-Codes:     Hulda Humphrey OTR/L [place] 03/30/2017, 6:03 PM

## 2017-03-31 ENCOUNTER — Other Ambulatory Visit: Payer: Self-pay

## 2017-03-31 LAB — BASIC METABOLIC PANEL
Anion gap: 7 (ref 5–15)
BUN: 14 mg/dL (ref 6–20)
CHLORIDE: 112 mmol/L — AB (ref 101–111)
CO2: 22 mmol/L (ref 22–32)
CREATININE: 1.05 mg/dL (ref 0.61–1.24)
Calcium: 8.3 mg/dL — ABNORMAL LOW (ref 8.9–10.3)
GFR calc non Af Amer: >60 mL/min (ref >60)
Glucose, Bld: 109 mg/dL — ABNORMAL HIGH (ref 65–99)
Potassium: 3.6 mmol/L (ref 3.5–5.1)
Sodium: 141 mmol/L (ref 135–145)

## 2017-03-31 LAB — CBC
HCT: 33.7 % — ABNORMAL LOW (ref 39.0–52.0)
Hemoglobin: 11 g/dL — ABNORMAL LOW (ref 13.0–17.0)
MCH: 32.2 pg (ref 26.0–34.0)
MCHC: 32.6 g/dL (ref 30.0–36.0)
MCV: 98.5 fL (ref 78.0–100.0)
PLATELETS: 90 10*3/uL — AB (ref 150–400)
RBC: 3.42 MIL/uL — AB (ref 4.22–5.81)
RDW: 13.5 % (ref 11.5–15.5)
WBC: 6.7 10*3/uL (ref 4.0–10.5)

## 2017-03-31 LAB — GLUCOSE, CAPILLARY
GLUCOSE-CAPILLARY: 117 mg/dL — AB (ref 65–99)
GLUCOSE-CAPILLARY: 174 mg/dL — AB (ref 65–99)
Glucose-Capillary: 120 mg/dL — ABNORMAL HIGH (ref 65–99)
Glucose-Capillary: 187 mg/dL — ABNORMAL HIGH (ref 65–99)
Glucose-Capillary: 191 mg/dL — ABNORMAL HIGH (ref 65–99)

## 2017-03-31 NOTE — Evaluation (Signed)
Physical Therapy Evaluation Patient Details Name: Jose [name] MRN: [id number] DOB: [birth year] Today's Date: 03/31/2017   History of Present Illness  Jose [name] is a 83 y.o. male Chronic systolic and diastolic CHF, EF 82-95%, overactive bladder, hyperlipidemia, hypertension, unsteady gait-wheelchair-bound, dementia, brought to the ED for evaluation of altered mental status noticed by his physical therapist at the nursing-rehab facility.   Clinical Impression  Patient presents with decreased independence with mobility due to weakness, decreased balance and decreased activity tolerance.  Able to progress from OT session yesterday to OOB this session.  Currently needing +2 A for safety.  Agree with return to SNF at [facility] at d/c.     Follow Up Recommendations SNF(return to [facility])    Equipment Recommendations  None recommended by PT    Recommendations for Other Services       Precautions / Restrictions Precautions Precautions: Fall      Mobility  Bed Mobility Overal bed mobility: Needs Assistance Bed Mobility: Rolling;Sidelying to Sit Rolling: Min assist Sidelying to sit: Mod assist;HOB elevated       General bed mobility comments: cues for using railing, increased time  Transfers Overall transfer level: Needs assistance Equipment used: Rolling walker (2 wheeled) Transfers: Sit to/from Stand Sit to Stand: From elevated surface;Max assist         General transfer comment: from EOB able to stand with one assist, lifting help, then from recliner +2 A after incontinent episode; stand pivot with walker mod A with +2 for safety and HR to 121, pt had to sit quickly  Ambulation/Gait                Stairs            Wheelchair Mobility    Modified Rankin (Stroke Patients Only)       Balance Overall balance assessment: Needs assistance   Sitting balance-Leahy Scale: Poor Sitting balance - Comments: cues for balance Postural control:  Left lateral lean;Posterior lean Standing balance support: Bilateral upper extremity supported Standing balance-Leahy Scale: Poor Standing balance comment: UE support and min to mod A for balance                             Pertinent Vitals/Pain Pain Assessment: No/denies pain    Home Living Family/patient expects to be discharged to:: Skilled nursing facility                 Additional Comments: [facility]    Prior Function Level of Independence: Needs assistance   Gait / Transfers Assistance Needed: working with PT-ambulating with RW  ADL's / Homemaking Assistance Needed: assist for all ADL (bathing/dressing)        Hand Dominance        Extremity/Trunk Assessment   Upper Extremity Assessment Upper Extremity Assessment: Defer to OT evaluation    Lower Extremity Assessment Lower Extremity Assessment: Generalized weakness    Cervical / Trunk Assessment Cervical / Trunk Assessment: Kyphotic;Other exceptions Cervical / Trunk Exceptions: leans L and posterior  Communication   Communication: HOH  Cognition Arousal/Alertness: Awake/alert Behavior During Therapy: Flat affect Overall Cognitive Status: Impaired/Different from baseline Area of Impairment: Following commands;Memory;Problem solving                     Memory: Decreased short-term memory Following Commands: Follows one step commands with increased time     Problem Solving: Slow processing;Decreased initiation;Requires verbal  cues;Requires tactile cues        General Comments General comments (skin integrity, edema, etc.): Patient's wife present before and after session; updated her on his progress    Exercises     Assessment/Plan    PT Assessment Patient needs continued PT services  PT Problem List Decreased strength;Decreased knowledge of use of DME;Decreased activity tolerance;Decreased balance;Decreased knowledge of precautions;Decreased mobility;Decreased  coordination       PT Treatment Interventions DME instruction;Balance training;Gait training;Neuromuscular re-education;Therapeutic activities;Therapeutic exercise    PT Goals (Current goals can be found in the Care Plan section)  Acute Rehab PT Goals Patient Stated Goal: To return home PT Goal Formulation: With patient/family Time For Goal Achievement: 04/14/17 Potential to Achieve Goals: Fair    Frequency Min 2X/week   Barriers to discharge        Co-evaluation               AM-PAC PT "6 Clicks" Daily Activity  Outcome Measure Difficulty turning over in bed (including adjusting bedclothes, sheets and blankets)?: Unable Difficulty moving from lying on back to sitting on the side of the bed? : Unable Difficulty sitting down on and standing up from a chair with arms (e.g., wheelchair, bedside commode, etc,.)?: Unable Help needed moving to and from a bed to chair (including a wheelchair)?: A Lot Help needed walking in hospital room?: Total Help needed climbing 3-5 steps with a railing? : Total 6 Click Score: 7    End of Session Equipment Utilized During Treatment: Gait belt Activity Tolerance: Patient limited by fatigue Patient left: in chair;with call bell/phone within reach;with family/visitor present Nurse Communication: Mobility status;Other (comment)(condom cath off) PT Visit Diagnosis: Other abnormalities of gait and mobility (R26.89);Muscle weakness (generalized) (M62.81)    Time: 0998-3382 PT Time Calculation (min) (ACUTE ONLY): 26 min   Charges:   PT Evaluation $PT Eval Moderate Complexity: 1 Mod PT Treatments $Therapeutic Activity: 8-22 mins   PT G CodesMagda Kiel, [place] [phone] 03/31/2017   Reginia Naas 03/31/2017, 3:52 PM

## 2017-03-31 NOTE — Progress Notes (Addendum)
PROGRESS NOTE  Jose [name] GMW:[id number] DOB: 23-May-1930 DOA: 03/29/2017 PCP: [name], MD   LOS: 2 days   Brief Narrative / Interim history: 83 y.o. male Chronic systolic and diastolic CHF, EF 44-03%, overactive bladder, hyperlipidemia, hypertension, unsteady gait-wheelchair-bound, dementia, brought to the ED for evaluation of altered mental status noticed by his physical therapy at the nursing-rehab facility.  Patient has been having URI type symptoms.  Assessment & Plan: Active Problems:   Alzheimer's dementia without behavioral disturbance   Type 2 diabetes mellitus with diabetic chronic kidney disease ([place])   Hyperlipidemia LDL goal <70   Leukocytosis   Urinary frequency   Thrombocytopenia (HCC)   Depression with anxiety   Major depression, recurrent, chronic (HCC)   Chronic bilateral low back pain with bilateral sciatica   Chronic combined systolic and diastolic congestive heart failure ([place])   Unsteady gait   Sepsis ([place])   Sepsis [name] to URI -afebrile today, sepsis physiology improved -Patient's respiratory virus panel positive for coronavirus, cannot fully exclude a component of aspiration given underlying dementia, transition antibiotics from vancomycin and cefepime to Zosyn. -continues to improve -Continue to monitor cultures, so far negative, if remain negative by 12/31 transition to po antibiotics and d/c SNF  Chronic combined systolic and diastolic CHF -Patient appears euvolemic this morning, stop IV fluids, allow diet as per speech therapy is evaluating patient -Continue Coreg, hold lisinopril  Type 2 diabetes mellitus -Continue with sliding scale  Overactive bladder -Continue home medications  Hypertension -Hold lisnopril [name] to soft blood pressure today  Hyperlipidemia -Continue statins  Alzheimer's dementia -Continue home medications  Thrombocytopenia -Monitor   DVT prophylaxis: Heparin Code Status: Full code Family Communication:  Discussed with daughter at bedside Disposition Plan: Back to SNF when medically stable, likely 1 day   Consultants:   None  Procedures:   None   Antimicrobials:  Zosyn 12/29 >>  Vancomycin/Cefepime/Flagyl 12/28 >> 12/29   Subjective: -confused but alert   Objective: Vitals:   03/31/17 0912 03/31/17 0929 03/31/17 1130 03/31/17 1201  BP: (!) 139/98     Pulse: 90  81   Resp:   20   Temp:    98.1 F (36.7 C)  TempSrc:    Oral  SpO2:  94% 97%   Weight:      Height:        Intake/Output Summary (Last 24 hours) at 03/31/2017 1227 Last data filed at 03/31/2017 0900 Gross per 24 hour  Intake 370 ml  Output 400 ml  Net -30 ml   Filed Weights   03/29/17 1643 03/30/17 0428 03/31/17 0238  Weight: 80.9 kg (178 lb 4.8 oz) 80.5 kg (177 lb 7.5 oz) 82 kg (180 lb 12.4 oz)    Examination:  Constitutional: confused Respiratory: CTA biL Cardiovascular: RRR   Data Reviewed: I have independently reviewed following labs and imaging studies  CBC: Recent Labs  Lab 03/29/17 1240 03/30/17 0337 03/31/17 0602  WBC 13.9* 6.4 6.7  NEUTROABS 12.6*  --   --   HGB 14.3 11.4* 11.0*  HCT 43.0 34.3* 33.7*  MCV 96.2 98.3 98.5  PLT 129* 100* 90*   Basic Metabolic Panel: Recent Labs  Lab 03/29/17 1240 03/30/17 0337 03/31/17 0602  NA 136 139 141  K 3.6 3.3* 3.6  CL 103 110 112*  CO2 25 22 22   GLUCOSE 173* 127* 109*  BUN 10 11 14   CREATININE 1.09 1.00 1.05  CALCIUM 9.0 8.1* 8.3*   GFR: Estimated Creatinine Clearance: 57.5 mL/min (  by C-G formula based on SCr of 1.05 mg/dL). Liver Function Tests: Recent Labs  Lab 03/29/17 1240 03/30/17 0337  AST 25 20  ALT 25 17  ALKPHOS 113 75  BILITOT 0.6 0.8  PROT 6.8 5.3*  ALBUMIN 3.8 2.8*   No results for input(s): LIPASE, AMYLASE in the last 168 hours. No results for input(s): AMMONIA in the last 168 hours. Coagulation Profile: Recent Labs  Lab 03/30/17 0337  INR 1.15   Cardiac Enzymes: No results for input(s):  CKTOTAL, CKMB, CKMBINDEX, TROPONINI in the last 168 hours. BNP (last 3 results) Recent Labs    12/18/16 1503 03/04/17 1520  PROBNP 1,931* 1,249*   HbA1C: Recent Labs    03/29/17 1439  HGBA1C 6.5*   CBG: Recent Labs  Lab 03/30/17 1200 03/30/17 1616 03/30/17 2209 03/31/17 0809 03/31/17 1222  GLUCAP 128* 159* 117* 120* 187*   Lipid Profile: No results for input(s): CHOL, HDL, LDLCALC, TRIG, CHOLHDL, LDLDIRECT in the last 72 hours. Thyroid Function Tests: No results for input(s): TSH, T4TOTAL, FREET4, T3FREE, THYROIDAB in the last 72 hours. Anemia Panel: No results for input(s): VITAMINB12, FOLATE, FERRITIN, TIBC, IRON, RETICCTPCT in the last 72 hours. Urine analysis:    Component Value Date/Time   COLORURINE YELLOW 03/29/2017 Grandfather 03/29/2017 1247   LABSPEC 1.014 03/29/2017 1247   PHURINE 6.0 03/29/2017 1247   GLUCOSEU NEGATIVE 03/29/2017 1247   HGBUR NEGATIVE 03/29/2017 1247   BILIRUBINUR NEGATIVE 03/29/2017 1247   KETONESUR 5 (A) 03/29/2017 1247   PROTEINUR NEGATIVE 03/29/2017 1247   UROBILINOGEN 0.2 06/24/2014 2112   NITRITE NEGATIVE 03/29/2017 1247   LEUKOCYTESUR NEGATIVE 03/29/2017 1247   Sepsis Labs: Invalid input(s): PROCALCITONIN, LACTICIDVEN  Recent Results (from the past 240 hour(s))  Blood Culture (routine x 2)     Status: None (Preliminary result)   Collection Time: 03/29/17 12:40 PM  Result Value Ref Range Status   Specimen Description BLOOD LEFT ANTECUBITAL  Final   Special Requests   Final    BOTTLES DRAWN AEROBIC AND ANAEROBIC Blood Culture adequate volume   Culture NO GROWTH < 24 HOURS  Final   Report Status PENDING  Incomplete  Urine culture     Status: None   Collection Time: 03/29/17 12:47 PM  Result Value Ref Range Status   Specimen Description URINE, RANDOM  Final   Special Requests NONE  Final   Culture NO GROWTH  Final   Report Status 03/30/2017 FINAL  Final  Blood Culture (routine x 2)     Status: None  (Preliminary result)   Collection Time: 03/29/17  1:02 PM  Result Value Ref Range Status   Specimen Description BLOOD RIGHT FOREARM  Final   Special Requests   Final    BOTTLES DRAWN AEROBIC ONLY Blood Culture adequate volume   Culture NO GROWTH < 24 HOURS  Final   Report Status PENDING  Incomplete  Respiratory Panel by PCR     Status: Abnormal   Collection Time: 03/29/17  1:10 PM  Result Value Ref Range Status   Adenovirus NOT DETECTED NOT DETECTED Final   Coronavirus 229E NOT DETECTED NOT DETECTED Final   Coronavirus HKU1 NOT DETECTED NOT DETECTED Final   Coronavirus NL63 NOT DETECTED NOT DETECTED Final   Coronavirus OC43 DETECTED (A) NOT DETECTED Final   Metapneumovirus NOT DETECTED NOT DETECTED Final   Rhinovirus / Enterovirus NOT DETECTED NOT DETECTED Final   Influenza A NOT DETECTED NOT DETECTED Final   Influenza B NOT DETECTED NOT  DETECTED Final   Parainfluenza Virus 1 NOT DETECTED NOT DETECTED Final   Parainfluenza Virus 2 NOT DETECTED NOT DETECTED Final   Parainfluenza Virus 3 NOT DETECTED NOT DETECTED Final   Parainfluenza Virus 4 NOT DETECTED NOT DETECTED Final   Respiratory Syncytial Virus NOT DETECTED NOT DETECTED Final   Bordetella pertussis NOT DETECTED NOT DETECTED Final   Chlamydophila pneumoniae NOT DETECTED NOT DETECTED Final   Mycoplasma pneumoniae NOT DETECTED NOT DETECTED Final  MRSA PCR Screening     Status: None   Collection Time: 03/30/17  7:36 AM  Result Value Ref Range Status   MRSA by PCR NEGATIVE NEGATIVE Final    Comment:        The GeneXpert MRSA Assay (FDA approved for NASAL specimens only), is one component of a comprehensive MRSA colonization surveillance program. It is not intended to diagnose MRSA infection nor to guide or monitor treatment for MRSA infections.       Radiology Studies: Dg Chest Port 1 View  Result Date: 03/30/2017 CLINICAL DATA:  Sepsis.  Cough.  Shortness breath. EXAM: PORTABLE CHEST 1 VIEW COMPARISON:  One-view  chest x-ray 03/29/2017 FINDINGS: The heart size is exaggerate by low lung volumes. Lungs are clear. The visualized soft tissues and bony thorax are unremarkable. IMPRESSION: Stable low lung volumes and cardiomegaly without acute cardiopulmonary disease. Electronically Signed   By: San Morelle M.D.   On: 03/30/2017 10:17   Dg Chest Portable 1 View  Result Date: 03/29/2017 CLINICAL DATA:  Unresponsive.  Cough.  Fever. EXAM: PORTABLE CHEST 1 VIEW COMPARISON:  11/28/2016. FINDINGS: Mediastinum hilar structures normal. Cardiomegaly with normal pulmonary vascularity. No focal infiltrate. No pleural effusion or pneumothorax. Degenerative change thoracic spine. IMPRESSION: Stable cardiomegaly .  No acute cardiopulmonary disease. Electronically Signed   By: Marcello Moores  Register   On: 03/29/2017 13:30   Dg Swallowing Func-speech Pathology  Result Date: 03/30/2017 Objective Swallowing Evaluation: Type of Study: MBS-Modified Barium Swallow Study  Patient Details Name: Jose [name] MRN: [id number] Date of Birth: 03-23-1931 Today's Date: 03/30/2017 Time: SLP Start Time (ACUTE ONLY): 1500 -SLP Stop Time (ACUTE ONLY): 1520 SLP Time Calculation (min) (ACUTE ONLY): 20 min Past Medical History: Past Medical History: Diagnosis Date . Abnormality of gait  . Backache, unspecified  . CHF (congestive heart failure) ([place])  . Colon polyps   adenomatous . Dementia 11/12/2012 . Depression  . Diabetes mellitus without complication ([place])  . Diverticulosis  . Essential and other specified forms of tremor  . Hemorrhoids  . History of bilateral hip replacements 11/12/2012 . Memory loss  . Other persistent mental disorders [name] to conditions classified elsewhere  . Pain in joint, pelvic region and thigh  . Skin cancer of scalp  . Spinal stenosis, lumbar region, without neurogenic claudication  . Unspecified hereditary and idiopathic peripheral neuropathy  Past Surgical History: Past Surgical History: Procedure Laterality Date . CATARACT  EXTRACTION, BILATERAL  2012 . JOINT REPLACEMENT   . RETINAL LASER PROCEDURE  2012  retinal wrinkle . SKIN CANCER EXCISION   . TOTAL HIP ARTHROPLASTY Left 2000 . TOTAL HIP ARTHROPLASTY (aka REPLACEMENT) Right 2011 HPI: 83 y.o.maleChronic systolic and diastolic CHF, EF 71-06%, overactive bladder, hyperlipidemia, hypertension, unsteady gait-wheelchair-bound, dementia, brought to the ED for evaluation of altered mental status noticed by his physical therapy at the nursing-rehab facility.Admitted with sepsis [name] to URI, respiratory virus panel positive for coronavirus. CXR 03/30/17 without acute cardiopulmonary disease.  Subjective: alert, cooperative Assessment / Plan / Recommendation CHL IP CLINICAL IMPRESSIONS 03/30/2017  Clinical Impression Patient presents with mild oropharyngeal dysphagia. Oral stage mildly prolonged with weak lingual manipulation. Swallow initiation delayed to valleculae with all consistencies. Pt naturally swallows in chin tuck position which does appear to facilitate containment of smaller boluses in the vallecuale prior to swallow initiation. Of note, pt did have coughing episode during the exam which was not associated with penetration or aspiration.Trace shallow penetration of thin with multiple consecutive straw sips. Pharyngeal stage characterized by reduced tongue base retraction, leading to mild residue in the valleculae with puree, thin liquids. Moderate residue with solids. With greater residue in the valleculae, subsequent deep penetration to vocal cords x1 and trace silent aspiration x1 with larger boluses of thin liquid wash, and penetration of nectar x1. Cued cough was not effective for clearance. Pt does tolerate small sips of thin liquid without airway compromise, particularly with less residue in the valleculae. Recommend dys 1, thin liquids via small cup sips, meds whole in puree. Will f/u for tolerance, education. SLP Visit Diagnosis Dysphagia, oropharyngeal phase (R13.12)  Attention and concentration deficit following -- Frontal lobe and executive function deficit following -- Impact on safety and function Mild aspiration risk   CHL IP TREATMENT RECOMMENDATION 03/30/2017 Treatment Recommendations F/U MBS in --- days (Comment);Therapy as outlined in treatment plan below   Prognosis 03/30/2017 Prognosis for Safe Diet Advancement Fair Barriers to Reach Goals Cognitive deficits;Time post onset Barriers/Prognosis Comment -- CHL IP DIET RECOMMENDATION 03/30/2017 SLP Diet Recommendations Dysphagia 1 (Puree) solids;Thin liquid Liquid Administration via Cup;No straw Medication Administration Whole meds with puree Compensations Minimize environmental distractions;Slow rate;Small sips/bites Postural Changes --   CHL IP OTHER RECOMMENDATIONS 03/30/2017 Recommended Consults -- Oral Care Recommendations Oral care BID Other Recommendations --   CHL IP FOLLOW UP RECOMMENDATIONS 03/30/2017 Follow up Recommendations Skilled Nursing facility   [facility] IP FREQUENCY AND DURATION 03/30/2017 Speech Therapy Frequency (ACUTE ONLY) min 2x/week Treatment Duration 1 week      CHL IP ORAL PHASE 03/30/2017 Oral Phase Impaired Oral - Pudding Teaspoon -- Oral - Pudding Cup -- Oral - Honey Teaspoon -- Oral - Honey Cup -- Oral - Nectar Teaspoon -- Oral - Nectar Cup -- Oral - Nectar Straw -- Oral - Thin Teaspoon -- Oral - Thin Cup -- Oral - Thin Straw -- Oral - Puree -- Oral - Mech Soft -- Oral - Regular Weak lingual manipulation;Delayed oral transit Oral - Multi-Consistency -- Oral - Pill -- Oral Phase - Comment --  CHL IP PHARYNGEAL PHASE 03/30/2017 Pharyngeal Phase Impaired Pharyngeal- Pudding Teaspoon -- Pharyngeal -- Pharyngeal- Pudding Cup -- Pharyngeal -- Pharyngeal- Honey Teaspoon -- Pharyngeal -- Pharyngeal- Honey Cup -- Pharyngeal -- Pharyngeal- Nectar Teaspoon -- Pharyngeal -- Pharyngeal- Nectar Cup -- Pharyngeal -- Pharyngeal- Nectar Straw Delayed swallow initiation-vallecula;Penetration/Aspiration during  swallow;Reduced airway/laryngeal closure;Pharyngeal residue - valleculae;Reduced tongue base retraction Pharyngeal Material enters airway, remains ABOVE vocal cords and not ejected out Pharyngeal- Thin Teaspoon -- Pharyngeal -- Pharyngeal- Thin Cup Delayed swallow initiation-vallecula;Reduced tongue base retraction;Pharyngeal residue - valleculae Pharyngeal -- Pharyngeal- Thin Straw Delayed swallow initiation-pyriform sinuses;Reduced airway/laryngeal closure;Reduced tongue base retraction;Penetration/Aspiration during swallow;Trace aspiration;Pharyngeal residue - valleculae Pharyngeal Material enters airway, CONTACTS cords and not ejected out;Material enters airway, passes BELOW cords without attempt by patient to eject out (silent aspiration) Pharyngeal- Puree Delayed swallow initiation-vallecula;Pharyngeal residue - valleculae Pharyngeal -- Pharyngeal- Mechanical Soft -- Pharyngeal -- Pharyngeal- Regular Delayed swallow initiation-vallecula;Reduced tongue base retraction;Pharyngeal residue - valleculae Pharyngeal -- Pharyngeal- Multi-consistency -- Pharyngeal -- Pharyngeal- Pill Delayed swallow initiation-vallecula;Pharyngeal residue - valleculae Pharyngeal --  Pharyngeal Comment --  CHL IP CERVICAL ESOPHAGEAL PHASE 03/30/2017 Cervical Esophageal Phase WFL Pudding Teaspoon -- Pudding Cup -- Honey Teaspoon -- Honey Cup -- Nectar Teaspoon -- Nectar Cup -- Nectar Straw -- Thin Teaspoon -- Thin Cup -- Thin Straw -- Puree -- Mechanical Soft -- Regular -- Multi-consistency -- Pill -- Cervical Esophageal Comment -- Deneise Lever, MS, CCC-SLP Speech-Language Pathologist [phone] No flowsheet data found. Aliene Altes 03/30/2017, 3:44 PM                Scheduled Meds: . buPROPion  100 mg Oral Daily  . carvedilol  3.125 mg Oral BID WC  . dorzolamide-timolol  1 drop Left Eye BID  . folic acid  1 mg Oral Daily  . heparin  5,000 Units Subcutaneous Q8H  . hydrocerin   Topical Daily  . insulin aspart  0-9 Units  Subcutaneous TID WC  . ipratropium-albuterol  3 mL Nebulization TID  . mirabegron ER  50 mg Oral Daily  . saccharomyces boulardii  250 mg Oral BID  . simvastatin  5 mg Oral Daily   Continuous Infusions: . piperacillin-tazobactam (ZOSYN)  IV Stopped (03/31/17 1594)    Marzetta Board, MD, PhD [facility] Pager [phone]  If 7PM-7AM, please contact night-coverage [email or web address] Password [email or web address] 03/31/2017, 12:27 PM

## 2017-03-31 NOTE — Progress Notes (Signed)
  Speech Language Pathology Treatment: Dysphagia  Patient Details Name: Jose [name] MRN: [id number] DOB: [birth year] Today's Date: 03/31/2017 Time: 0910-0930 SLP Time Calculation (min) (ACUTE ONLY): 20 min  Assessment / Plan / Recommendation Clinical Impression  Pt seen for follow-up for dysphagia for diet tolerance and pt/family education after MBS yesterday. RN reporting pt with excessive mucous this morning. When SLP arrived, daughter in room having just fed pt breakfast. Pt reclined approx 45 degrees, with wet, gurgling vocal quality and congested coughing. SLP educated re: results of MBS with teachback. Explained that full upright, 90 degree positioning is optimal for pt due to his swallow delay. Suspect in semi-reclined position that pt likely aspirating due to spillover from valleculae. In upright position, pt swallows with natural chin tuck which does appear to help with airway protection. Stressed importance of small sips, slow rate, NO STRAWS, allowing pt time to swallow between bites/sips. RN administered medications in puree which pt masticated prior to swallowing. Delayed coughing noted, however pt with consistent congested cough at baseline. Eventually pt was able to cough yellowish secretions which SLP suctioned from base of tongue with yankeur and vocal quality improved. RRT arrived to give pt breathing treatment, so no further PO trials administered. RN present and agreeable to assisting pt with upright posture or out of bed in recliner for meals. Will follow closely. Continue puree, thin liquids with close supervision and precautions posted.    HPI HPI: 83 y.o.maleChronic systolic and diastolic CHF, EF 03-21%, overactive bladder, hyperlipidemia, hypertension, unsteady gait-wheelchair-bound, dementia, brought to the ED for evaluation of altered mental status noticed by his physical therapy at the nursing-rehab facility.Admitted with sepsis due to URI, respiratory virus panel positive  for coronavirus. CXR 03/30/17 without acute cardiopulmonary disease.      SLP Plan  Continue with current plan of care       Recommendations  Diet recommendations: Dysphagia 1 (puree);Thin liquid Liquids provided via: Cup;No straw Medication Administration: Crushed with puree Supervision: Full supervision/cueing for compensatory strategies Compensations: Minimize environmental distractions;Slow rate;Small sips/bites Postural Changes and/or Swallow Maneuvers: Seated upright 90 degrees;Out of bed for meals;Chin tuck                Oral Care Recommendations: Oral care BID Follow up Recommendations: Skilled Nursing facility SLP Visit Diagnosis: Dysphagia, oropharyngeal phase (R13.12) Plan: Continue with current plan of care       [place], [place], [place] Speech-Language Pathologist [phone]  Aliene Altes 03/31/2017, 9:49 AM

## 2017-03-31 NOTE — Progress Notes (Signed)
CCMD reports patient had 15 beats of SVT, he has had 4 liquid bowel movements today, he also had a coughing spell after thin liquids this afternoon, provider made aware via text page. Will continue to monitor closely.

## 2017-04-01 DIAGNOSIS — F339 Major depressive disorder, recurrent, unspecified: Secondary | ICD-10-CM

## 2017-04-01 DIAGNOSIS — A419 Sepsis, unspecified organism: Principal | ICD-10-CM

## 2017-04-01 LAB — GLUCOSE, CAPILLARY
GLUCOSE-CAPILLARY: 164 mg/dL — AB (ref 65–99)
Glucose-Capillary: 107 mg/dL — ABNORMAL HIGH (ref 65–99)

## 2017-04-01 MED ORDER — AMOXICILLIN-POT CLAVULANATE 875-125 MG PO TABS: 1.0000 | ORAL_TABLET | Freq: Two times a day (BID) | ORAL | 0 refills | Status: AC

## 2017-04-01 NOTE — Discharge Summary (Signed)
Physician Discharge Summary  Jose [name] ZDG:[id number] DOB: [birth year] DOA: 03/29/2017  PCP: [name], MD  Admit date: 03/29/2017 Discharge date: 04/01/2017  Admitted From: SNF Disposition:  SNF  Recommendations for Outpatient Follow-up:  1. Follow up with PCP in 1-2 weeks 2. Continue Augmentin for 3 days 3. Aspiration precautions   Home Health: none Equipment/Devices: none  Discharge Condition: stable CODE STATUS: Full code Diet recommendation: per speech therapy, Diet recommendations: Dysphagia 1 (puree);Thin liquid Liquids provided via: Cup;No straw Medication Administration: Crushed with puree Supervision: Full supervision/cueing for compensatory strategies Compensations: Minimize environmental distractions;Slow rate;Small sips/bites Postural Changes and/or Swallow Maneuvers: Seated upright 90 degrees;Out of bed for meals;Chin tuck   HPI: per Sharene Butters, PA Jose [name] is a 83 y.o. male Chronic systolic and diastolic CHF, EF 59-56%, overactive bladder, hyperlipidemia, hypertension, unsteady gait-wheelchair-bound, dementia, brought to the ED for evaluation of altered mental status noticed this morning by his physical therapy at the nursing-rehab facility.  Wife is the one providing all the history, as the level is the patient is level 5 caveat due to increased confusion.  From 6 PM, the patient was in his usual state of health, at baseline of his interaction level.  He was able to recognize family and was alert to self, place and events at the time.  However, this morning, patient was clearly confused.  Wife also reported thick clear rhinorrhea, with dry cough since this morning.  The patient is more somnolent.  Wife denies any noted facial drooping or slurred speech prior to these events.  No history of TIA or CVA.  No falls at the nursing facility.  No history of seizures.  She reported him to have chills, but she did not notice him having subjective fevers.  She  denies the patient having increased lower extremity edema.  She did have decreased oral intake as his symptoms develop.  Hospital Course: Discharge Diagnoses:  Active Problems:   Alzheimer's dementia without behavioral disturbance   Type 2 diabetes mellitus with diabetic chronic kidney disease (HCC)   Hyperlipidemia LDL goal <70   Leukocytosis   Urinary frequency   Thrombocytopenia (HCC)   Depression with anxiety   Major depression, recurrent, chronic (HCC)   Chronic bilateral low back pain with bilateral sciatica   Chronic combined systolic and diastolic congestive heart failure (HCC)   Unsteady gait   Sepsis ([place])   Sepsis due to Coronavirus URI / aspiration pneumonia -patient was admitted to the hospital with sepsis in the setting of URI. Patient's respiratory virus panel positive for coronavirus, cannot fully exclude a component of aspiration given underlying dementia. He was treated with Zosyn for aspiration and supportive treatment, his fever resolved and is now afebrile for 48h. Clinically he has returned to baseline per family and will be discharged back to SNF in stable condition. Recommend Augmentin for 3 more days.  Chronic combined systolic and diastolic CHF -Patient appears euvolemic, continue home medications  Type 2 diabetes mellitus  -Continue home medications  Overactive bladder -Continue home medications Hypertension -resume home medications Hyperlipidemia -Continue statins Alzheimer's dementia -Continue home medications Thrombocytopenia -chronic, sepsis contributing, stable  Discharge Instructions   Allergies as of 04/01/2017      Reactions   Axona [bacid]    Unknown per MAR   Gabapentin    Hallucinations      Medication List    TAKE these medications   acetaminophen 500 MG tablet Commonly known as:  TYLENOL Take 500 mg by mouth every  8 (eight) hours as needed (pain).   ALIGN 4 MG Caps Take 4 mg by mouth daily.   amoxicillin-clavulanate 875-125 MG  tablet Commonly known as:  AUGMENTIN Take 1 tablet by mouth 2 (two) times daily for 3 days.   buPROPion 100 MG 12 hr tablet Commonly known as:  WELLBUTRIN SR Take 100 mg by mouth daily.   carvedilol 3.125 MG tablet Commonly known as:  COREG Take 1 tablet (3.125 mg total) by mouth 2 (two) times daily with a meal.   CERAVE Crea Apply 1 application topically daily. Apply to arms, legs and trunk   donepezil 23 MG Tabs tablet Commonly known as:  ARICEPT Take 1 tablet (23 mg total) by mouth daily.   dorzolamide-timolol 22.3-6.8 MG/ML ophthalmic solution Commonly known as:  COSOPT Place 1 drop into the left eye 2 (two) times daily.   Flaxseed Oil 1000 MG Caps Take 1,000 mg by mouth daily.   folic acid 962 MCG tablet Commonly known as:  FOLVITE Take 800 mcg by mouth every evening.   furosemide 20 MG tablet Commonly known as:  LASIX Take 1 tablet (20 mg total) by mouth daily.   lisinopril 5 MG tablet Commonly known as:  PRINIVIL,ZESTRIL Take 1 tablet (5 mg total) by mouth daily.   loperamide 2 MG tablet Commonly known as:  IMODIUM A-D Take 2 mg by mouth 4 (four) times daily as needed for diarrhea or loose stools.   memantine 28 MG Cp24 24 hr capsule Commonly known as:  NAMENDA XR Take 1 capsule (28 mg total) by mouth daily.   metFORMIN 500 MG tablet Commonly known as:  GLUCOPHAGE Take 500 mg by mouth daily.   multivitamin tablet Take 1 tablet by mouth daily.   MYRBETRIQ 50 MG Tb24 tablet Generic drug:  mirabegron ER Take 50 mg by mouth daily.   nystatin powder Generic drug:  nystatin Apply topically 2 (two) times daily as needed. Apply to redness and scrotal and sacral area   PROCTOSOL HC 2.5 % rectal cream Generic drug:  hydrocortisone Place 1 application rectally at bedtime as needed for hemorrhoids or anal itching.   simvastatin 5 MG tablet Commonly known as:  ZOCOR Take 5 mg by mouth daily.   Vitamin D 1000 units capsule Take 1,000 Units by mouth  daily.   ZINC OXIDE EX Apply 1 application topically 3 (three) times daily as needed.       Consultations:  None   Procedures/Studies:  Dg Chest Port 1 View  Result Date: 03/30/2017 CLINICAL DATA:  Sepsis.  Cough.  Shortness breath. EXAM: PORTABLE CHEST 1 VIEW COMPARISON:  One-view chest x-ray 03/29/2017 FINDINGS: The heart size is exaggerate by low lung volumes. Lungs are clear. The visualized soft tissues and bony thorax are unremarkable. IMPRESSION: Stable low lung volumes and cardiomegaly without acute cardiopulmonary disease. Electronically Signed   By: San Morelle M.D.   On: 03/30/2017 10:17   Dg Chest Portable 1 View  Result Date: 03/29/2017 CLINICAL DATA:  Unresponsive.  Cough.  Fever. EXAM: PORTABLE CHEST 1 VIEW COMPARISON:  11/28/2016. FINDINGS: Mediastinum hilar structures normal. Cardiomegaly with normal pulmonary vascularity. No focal infiltrate. No pleural effusion or pneumothorax. Degenerative change thoracic spine. IMPRESSION: Stable cardiomegaly .  No acute cardiopulmonary disease. Electronically Signed   By: Marcello Moores  Register   On: 03/29/2017 13:30   Dg Swallowing Func-speech Pathology  Result Date: 03/30/2017 Objective Swallowing Evaluation: Type of Study: MBS-Modified Barium Swallow Study  Patient Details Name: Jose [name] MRN: [id number]  Date of Birth: Feb 09, 1931 Today's Date: 03/30/2017 Time: SLP Start Time (ACUTE ONLY): 1500 -SLP Stop Time (ACUTE ONLY): 1520 SLP Time Calculation (min) (ACUTE ONLY): 20 min Past Medical History: Past Medical History: Diagnosis Date . Abnormality of gait  . Backache, unspecified  . CHF (congestive heart failure) ([place])  . Colon polyps   adenomatous . Dementia 11/12/2012 . Depression  . Diabetes mellitus without complication ([place])  . Diverticulosis  . Essential and other specified forms of tremor  . Hemorrhoids  . History of bilateral hip replacements 11/12/2012 . Memory loss  . Other persistent mental disorders due to conditions  classified elsewhere  . Pain in joint, pelvic region and thigh  . Skin cancer of scalp  . Spinal stenosis, lumbar region, without neurogenic claudication  . Unspecified hereditary and idiopathic peripheral neuropathy  Past Surgical History: Past Surgical History: Procedure Laterality Date . CATARACT EXTRACTION, BILATERAL  2012 . JOINT REPLACEMENT   . RETINAL LASER PROCEDURE  2012  retinal wrinkle . SKIN CANCER EXCISION   . TOTAL HIP ARTHROPLASTY Left 2000 . TOTAL HIP ARTHROPLASTY (aka REPLACEMENT) Right 2011 HPI: 83 y.o.maleChronic systolic and diastolic CHF, EF 53-61%, overactive bladder, hyperlipidemia, hypertension, unsteady gait-wheelchair-bound, dementia, brought to the ED for evaluation of altered mental status noticed by his physical therapy at the nursing-rehab facility.Admitted with sepsis due to URI, respiratory virus panel positive for coronavirus. CXR 03/30/17 without acute cardiopulmonary disease.  Subjective: alert, cooperative Assessment / Plan / Recommendation CHL IP CLINICAL IMPRESSIONS 03/30/2017 Clinical Impression Patient presents with mild oropharyngeal dysphagia. Oral stage mildly prolonged with weak lingual manipulation. Swallow initiation delayed to valleculae with all consistencies. Pt naturally swallows in chin tuck position which does appear to facilitate containment of smaller boluses in the vallecuale prior to swallow initiation. Of note, pt did have coughing episode during the exam which was not associated with penetration or aspiration.Trace shallow penetration of thin with multiple consecutive straw sips. Pharyngeal stage characterized by reduced tongue base retraction, leading to mild residue in the valleculae with puree, thin liquids. Moderate residue with solids. With greater residue in the valleculae, subsequent deep penetration to vocal cords x1 and trace silent aspiration x1 with larger boluses of thin liquid wash, and penetration of nectar x1. Cued cough was not effective  for clearance. Pt does tolerate small sips of thin liquid without airway compromise, particularly with less residue in the valleculae. Recommend dys 1, thin liquids via small cup sips, meds whole in puree. Will f/u for tolerance, education. SLP Visit Diagnosis Dysphagia, oropharyngeal phase (R13.12) Attention and concentration deficit following -- Frontal lobe and executive function deficit following -- Impact on safety and function Mild aspiration risk   CHL IP TREATMENT RECOMMENDATION 03/30/2017 Treatment Recommendations F/U MBS in --- days (Comment);Therapy as outlined in treatment plan below   Prognosis 03/30/2017 Prognosis for Safe Diet Advancement Fair Barriers to Reach Goals Cognitive deficits;Time post onset Barriers/Prognosis Comment -- CHL IP DIET RECOMMENDATION 03/30/2017 SLP Diet Recommendations Dysphagia 1 (Puree) solids;Thin liquid Liquid Administration via Cup;No straw Medication Administration Whole meds with puree Compensations Minimize environmental distractions;Slow rate;Small sips/bites Postural Changes --   CHL IP OTHER RECOMMENDATIONS 03/30/2017 Recommended Consults -- Oral Care Recommendations Oral care BID Other Recommendations --   CHL IP FOLLOW UP RECOMMENDATIONS 03/30/2017 Follow up Recommendations Skilled Nursing facility   [facility] IP FREQUENCY AND DURATION 03/30/2017 Speech Therapy Frequency (ACUTE ONLY) min 2x/week Treatment Duration 1 week      CHL IP ORAL PHASE 03/30/2017 Oral Phase Impaired Oral - Pudding Teaspoon --  Oral - Pudding Cup -- Oral - Honey Teaspoon -- Oral - Honey Cup -- Oral - Nectar Teaspoon -- Oral - Nectar Cup -- Oral - Nectar Straw -- Oral - Thin Teaspoon -- Oral - Thin Cup -- Oral - Thin Straw -- Oral - Puree -- Oral - Mech Soft -- Oral - Regular Weak lingual manipulation;Delayed oral transit Oral - Multi-Consistency -- Oral - Pill -- Oral Phase - Comment --  CHL IP PHARYNGEAL PHASE 03/30/2017 Pharyngeal Phase Impaired Pharyngeal- Pudding Teaspoon -- Pharyngeal --  Pharyngeal- Pudding Cup -- Pharyngeal -- Pharyngeal- Honey Teaspoon -- Pharyngeal -- Pharyngeal- Honey Cup -- Pharyngeal -- Pharyngeal- Nectar Teaspoon -- Pharyngeal -- Pharyngeal- Nectar Cup -- Pharyngeal -- Pharyngeal- Nectar Straw Delayed swallow initiation-vallecula;Penetration/Aspiration during swallow;Reduced airway/laryngeal closure;Pharyngeal residue - valleculae;Reduced tongue base retraction Pharyngeal Material enters airway, remains ABOVE vocal cords and not ejected out Pharyngeal- Thin Teaspoon -- Pharyngeal -- Pharyngeal- Thin Cup Delayed swallow initiation-vallecula;Reduced tongue base retraction;Pharyngeal residue - valleculae Pharyngeal -- Pharyngeal- Thin Straw Delayed swallow initiation-pyriform sinuses;Reduced airway/laryngeal closure;Reduced tongue base retraction;Penetration/Aspiration during swallow;Trace aspiration;Pharyngeal residue - valleculae Pharyngeal Material enters airway, CONTACTS cords and not ejected out;Material enters airway, passes BELOW cords without attempt by patient to eject out (silent aspiration) Pharyngeal- Puree Delayed swallow initiation-vallecula;Pharyngeal residue - valleculae Pharyngeal -- Pharyngeal- Mechanical Soft -- Pharyngeal -- Pharyngeal- Regular Delayed swallow initiation-vallecula;Reduced tongue base retraction;Pharyngeal residue - valleculae Pharyngeal -- Pharyngeal- Multi-consistency -- Pharyngeal -- Pharyngeal- Pill Delayed swallow initiation-vallecula;Pharyngeal residue - valleculae Pharyngeal -- Pharyngeal Comment --  CHL IP CERVICAL ESOPHAGEAL PHASE 03/30/2017 Cervical Esophageal Phase WFL Pudding Teaspoon -- Pudding Cup -- Honey Teaspoon -- Honey Cup -- Nectar Teaspoon -- Nectar Cup -- Nectar Straw -- Thin Teaspoon -- Thin Cup -- Thin Straw -- Puree -- Mechanical Soft -- Regular -- Multi-consistency -- Pill -- Cervical Esophageal Comment -- Deneise Lever, MS, CCC-SLP Speech-Language Pathologist [phone] No flowsheet data found. Aliene Altes  03/30/2017, 3:44 PM                Subjective: - no chest pain, shortness of breath, no abdominal pain, nausea or vomiting.   Discharge Exam: Vitals:   04/01/17 0825 04/01/17 0849  BP: 117/69   Pulse: 71   Resp: 20   Temp: 97.9 F (36.6 C)   SpO2: 95% 96%    General: Pt is alert, awake, not in acute distress Cardiovascular: RRR, S1/S2 +, no rubs, no gallops Respiratory: CTA bilaterally, no wheezing, no rhonchi Abdominal: Soft, NT, ND, bowel sounds + Extremities: no edema, no cyanosis    The results of significant diagnostics from this hospitalization (including imaging, microbiology, ancillary and laboratory) are listed below for reference.     Microbiology: Recent Results (from the past 240 hour(s))  Blood Culture (routine x 2)     Status: None (Preliminary result)   Collection Time: 03/29/17 12:40 PM  Result Value Ref Range Status   Specimen Description BLOOD LEFT ANTECUBITAL  Final   Special Requests   Final    BOTTLES DRAWN AEROBIC AND ANAEROBIC Blood Culture adequate volume   Culture NO GROWTH 2 DAYS  Final   Report Status PENDING  Incomplete  Urine culture     Status: None   Collection Time: 03/29/17 12:47 PM  Result Value Ref Range Status   Specimen Description URINE, RANDOM  Final   Special Requests NONE  Final   Culture NO GROWTH  Final   Report Status 03/30/2017 FINAL  Final  Blood Culture (routine x 2)  Status: None (Preliminary result)   Collection Time: 03/29/17  1:02 PM  Result Value Ref Range Status   Specimen Description BLOOD RIGHT FOREARM  Final   Special Requests   Final    BOTTLES DRAWN AEROBIC ONLY Blood Culture adequate volume   Culture NO GROWTH 2 DAYS  Final   Report Status PENDING  Incomplete  Respiratory Panel by PCR     Status: Abnormal   Collection Time: 03/29/17  1:10 PM  Result Value Ref Range Status   Adenovirus NOT DETECTED NOT DETECTED Final   Coronavirus 229E NOT DETECTED NOT DETECTED Final   Coronavirus HKU1 NOT DETECTED  NOT DETECTED Final   Coronavirus NL63 NOT DETECTED NOT DETECTED Final   Coronavirus OC43 DETECTED (A) NOT DETECTED Final   Metapneumovirus NOT DETECTED NOT DETECTED Final   Rhinovirus / Enterovirus NOT DETECTED NOT DETECTED Final   Influenza A NOT DETECTED NOT DETECTED Final   Influenza B NOT DETECTED NOT DETECTED Final   Parainfluenza Virus 1 NOT DETECTED NOT DETECTED Final   Parainfluenza Virus 2 NOT DETECTED NOT DETECTED Final   Parainfluenza Virus 3 NOT DETECTED NOT DETECTED Final   Parainfluenza Virus 4 NOT DETECTED NOT DETECTED Final   Respiratory Syncytial Virus NOT DETECTED NOT DETECTED Final   Bordetella pertussis NOT DETECTED NOT DETECTED Final   Chlamydophila pneumoniae NOT DETECTED NOT DETECTED Final   Mycoplasma pneumoniae NOT DETECTED NOT DETECTED Final  MRSA PCR Screening     Status: None   Collection Time: 03/30/17  7:36 AM  Result Value Ref Range Status   MRSA by PCR NEGATIVE NEGATIVE Final    Comment:        The GeneXpert MRSA Assay (FDA approved for NASAL specimens only), is one component of a comprehensive MRSA colonization surveillance program. It is not intended to diagnose MRSA infection nor to guide or monitor treatment for MRSA infections.      Labs: BNP (last 3 results) Recent Labs    11/28/16 1129  BNP 834.1*   Basic Metabolic Panel: Recent Labs  Lab 03/29/17 1240 03/30/17 0337 03/31/17 0602  NA 136 139 141  K 3.6 3.3* 3.6  CL 103 110 112*  CO2 25 22 22   GLUCOSE 173* 127* 109*  BUN 10 11 14   CREATININE 1.09 1.00 1.05  CALCIUM 9.0 8.1* 8.3*   Liver Function Tests: Recent Labs  Lab 03/29/17 1240 03/30/17 0337  AST 25 20  ALT 25 17  ALKPHOS 113 75  BILITOT 0.6 0.8  PROT 6.8 5.3*  ALBUMIN 3.8 2.8*   No results for input(s): LIPASE, AMYLASE in the last 168 hours. No results for input(s): AMMONIA in the last 168 hours. CBC: Recent Labs  Lab 03/29/17 1240 03/30/17 0337 03/31/17 0602  WBC 13.9* 6.4 6.7  NEUTROABS 12.6*  --    --   HGB 14.3 11.4* 11.0*  HCT 43.0 34.3* 33.7*  MCV 96.2 98.3 98.5  PLT 129* 100* 90*   Cardiac Enzymes: No results for input(s): CKTOTAL, CKMB, CKMBINDEX, TROPONINI in the last 168 hours. BNP: Invalid input(s): POCBNP CBG: Recent Labs  Lab 03/31/17 0809 03/31/17 1222 03/31/17 1703 03/31/17 2122 04/01/17 0750  GLUCAP 120* 187* 174* 191* 107*   D-Dimer No results for input(s): DDIMER in the last 72 hours. Hgb A1c Recent Labs    03/29/17 1439  HGBA1C 6.5*   Lipid Profile No results for input(s): CHOL, HDL, LDLCALC, TRIG, CHOLHDL, LDLDIRECT in the last 72 hours. Thyroid function studies No results for input(s): TSH,  T4TOTAL, T3FREE, THYROIDAB in the last 72 hours.  Invalid input(s): FREET3 Anemia work up No results for input(s): VITAMINB12, FOLATE, FERRITIN, TIBC, IRON, RETICCTPCT in the last 72 hours. Urinalysis    Component Value Date/Time   COLORURINE YELLOW 03/29/2017 1247   APPEARANCEUR CLEAR 03/29/2017 1247   LABSPEC 1.014 03/29/2017 1247   PHURINE 6.0 03/29/2017 1247   GLUCOSEU NEGATIVE 03/29/2017 1247   HGBUR NEGATIVE 03/29/2017 1247   BILIRUBINUR NEGATIVE 03/29/2017 1247   KETONESUR 5 (A) 03/29/2017 1247   PROTEINUR NEGATIVE 03/29/2017 1247   UROBILINOGEN 0.2 06/24/2014 2112   NITRITE NEGATIVE 03/29/2017 1247   LEUKOCYTESUR NEGATIVE 03/29/2017 1247   Sepsis Labs Invalid input(s): PROCALCITONIN,  WBC,  LACTICIDVEN   Time coordinating discharge: 35 minutes  SIGNED:  Marzetta Board, MD  [facility] 04/01/2017, 9:31 AM Pager [phone]  If 7PM-7AM, please contact night-coverage [email or web address] Password [email or web address]

## 2017-04-01 NOTE — Clinical Social Work Note (Signed)
Clinical Social Worker facilitated patient discharge including contacting patient family and facility to confirm patient discharge plans.  Clinical information faxed to facility and family agreeable with plan.  CSW arranged ambulance transport via [facility] to [facility] (SNF) .  RN to call [phone] for report prior to discharge.  Clinical Social Worker will sign off for now as social work intervention is no longer needed. Please consult [place] again if new need arises.  [place], [place]

## 2017-04-01 NOTE — Clinical Social Work Placement (Signed)
   CLINICAL SOCIAL WORK PLACEMENT  NOTE  Date:  04/01/2017  Patient Details  Name: Jose [name] MRN: [id number] Date of Birth: Jul 27, 1930  Clinical Social Work is seeking post-discharge placement for this patient at the [facility] level of care (*CSW will initial, date and re-position this form in  chart as items are completed):      Patient/family provided with [facility] Work Department's list of facilities offering this level of care within the geographic area requested by the patient (or if unable, by the patient's family).  Yes   Patient/family informed of their freedom to choose among providers that offer the needed level of care, that participate in Medicare, Medicaid or managed care program needed by the patient, have an available bed and are willing to accept the patient.      Patient/family informed of [facility]'s ownership interest in [facility] and [facility], as well as of the fact that they are under no obligation to receive care at these facilities.  PASRR submitted to EDS on       PASRR number received on 03/30/17     Existing PASRR number confirmed on 03/30/17     FL2 transmitted to all facilities in geographic area requested by pt/family on 03/30/17     FL2 transmitted to all facilities within larger geographic area on       Patient informed that his/her managed care company has contracts with or will negotiate with certain facilities, including the following:        Yes   Patient/family informed of bed offers received.  Patient chooses bed at [facility]     Physician recommends and patient chooses bed at      Patient to be transferred to [facility] on 04/01/17.  Patient to be transferred to facility by [facility]     Patient family notified on 04/01/17 of transfer.  Name of family member notified:  [name]     PHYSICIAN Please prepare prescriptions     Additional Comment:     _______________________________________________ Eileen Stanford, LCSW 04/01/2017, 11:04 AM

## 2017-04-01 NOTE — Progress Notes (Signed)
Report given to Jefm Bryant, RN  At [facility] home. IV site was removed. Pt was cleaned up and clothes were placed on pt. [facility] was called and tele was removed. Transport to take pt to SNF via ambulance.

## 2017-04-03 ENCOUNTER — Encounter: Payer: Self-pay | Admitting: Internal Medicine

## 2017-04-03 ENCOUNTER — Telehealth: Payer: Self-pay

## 2017-04-03 ENCOUNTER — Non-Acute Institutional Stay (SKILLED_NURSING_FACILITY): Payer: Medicare Other | Admitting: Internal Medicine

## 2017-04-03 DIAGNOSIS — F0281 Dementia in other diseases classified elsewhere with behavioral disturbance: Secondary | ICD-10-CM | POA: Diagnosis not present

## 2017-04-03 DIAGNOSIS — G308 Other Alzheimer's disease: Secondary | ICD-10-CM | POA: Diagnosis not present

## 2017-04-03 DIAGNOSIS — F02818 Dementia in other diseases classified elsewhere, unspecified severity, with other behavioral disturbance: Secondary | ICD-10-CM

## 2017-04-03 DIAGNOSIS — E785 Hyperlipidemia, unspecified: Secondary | ICD-10-CM | POA: Diagnosis not present

## 2017-04-03 DIAGNOSIS — E1169 Type 2 diabetes mellitus with other specified complication: Secondary | ICD-10-CM | POA: Diagnosis not present

## 2017-04-03 DIAGNOSIS — R531 Weakness: Secondary | ICD-10-CM

## 2017-04-03 DIAGNOSIS — E1122 Type 2 diabetes mellitus with diabetic chronic kidney disease: Secondary | ICD-10-CM | POA: Diagnosis not present

## 2017-04-03 DIAGNOSIS — N3281 Overactive bladder: Secondary | ICD-10-CM | POA: Diagnosis not present

## 2017-04-03 DIAGNOSIS — J690 Pneumonitis due to inhalation of food and vomit: Secondary | ICD-10-CM | POA: Diagnosis not present

## 2017-04-03 DIAGNOSIS — R1312 Dysphagia, oropharyngeal phase: Secondary | ICD-10-CM | POA: Diagnosis not present

## 2017-04-03 DIAGNOSIS — I5042 Chronic combined systolic (congestive) and diastolic (congestive) heart failure: Secondary | ICD-10-CM

## 2017-04-03 LAB — CULTURE, BLOOD (ROUTINE X 2)
Culture: NO GROWTH
Culture: NO GROWTH
Special Requests: ADEQUATE
Special Requests: ADEQUATE

## 2017-04-03 NOTE — Telephone Encounter (Signed)
Possible re-admission to facility. This is a patient you were seeing at [facility] F/U is needed if patient was re-admitted to facility upon discharge. Hospital discharge from [facility] on 04/01/2017

## 2017-04-03 NOTE — Progress Notes (Signed)
Provider:  [name] MD  Location:  [facility] Room Number: 69 Place of Service:  SNF (31)  PCP: [name], MD Patient Care Team: [name], MD as PCP - General (Internal Medicine) [name], [name], NP as Nurse Practitioner (Family Medicine)  Extended Emergency Contact Information Primary Emergency Contact: Jose [name] Address: [place], [place] Phone: [phone] Mobile Phone: [phone] Relation: Spouse Secondary Emergency Contact: Jose [name] [place] of [place] Phone: [phone] Work Phone: [phone] Relation: Daughter   Goals of Care: Advanced Directive information Advanced Directives 04/03/2017  Does Patient Have a Medical Advance Directive? Yes  Type of Paramedic of [place];Living will  Does patient want to make changes to medical advance directive? No - Patient declined  Copy of [place] in Chart? Yes  Would patient like information on creating a medical advance directive? -  Pre-existing out of facility DNR order (yellow form or pink MOST form) -      Chief Complaint  Patient presents with  . Readmit To SNF    Readmission Visit     HPI: Patient is a 84 y.o. male seen today for readmission visit. He was in the hospital from 03/29/17-04/01/17 with URI and aspiration pneumonia with SIRS/ sepsis. He was started on iv zosyn and later switched to augmentin. He has advanced dementia and requires assistance with his ADLs. He is now on dysphagia diet. He has medical history of chronic systolic CHF, OAB, HTN, HLD and unsteady gait among others. He is seen in his room today. He is pleasantly confused.   Past Medical History:  Diagnosis Date  . Abnormality of gait   . Backache, unspecified   . CHF (congestive heart failure) ([place])   . Colon polyps    adenomatous  . Dementia 11/12/2012  . Depression   . Diabetes  mellitus without complication ([place])   . Diverticulosis   . Essential and other specified forms of tremor   . Hemorrhoids   . History of bilateral hip replacements 11/12/2012  . Memory loss   . 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 Pain in joint, pelvic region and thigh   . Skin cancer of scalp   . Spinal stenosis, lumbar region, without neurogenic claudication   . Unspecified hereditary and idiopathic peripheral neuropathy    Past Surgical History:  Procedure Laterality Date  . CATARACT EXTRACTION, BILATERAL  2012  . JOINT REPLACEMENT    . RETINAL LASER PROCEDURE  2012   retinal wrinkle  . SKIN CANCER EXCISION    . TOTAL HIP ARTHROPLASTY Left 2000  . TOTAL HIP ARTHROPLASTY (aka REPLACEMENT) Right 2011    reports that he has quit smoking. His smoking use included cigarettes. he has never used smokeless tobacco. He reports that he drinks about 8.4 oz of alcohol per week. He reports that he does not use drugs. Social History   Socioeconomic History  . Marital status: Married    Spouse name: [name]  . Number of children: 2  . Years of education: Chief Executive Officer  . Highest education level: Not on file  Social Needs  . Financial resource strain: Not on file  . Food insecurity - worry: Not on file  . Food insecurity - inability: Not on file  . Transportation needs - medical: Not on file  . Transportation needs - non-medical: Not on file  Occupational History  . Occupation: Retired    Fish farm manager: [employer]    Comment: retired  Tobacco Use  . Smoking status: Former Smoker    Types: Cigarettes  . Smokeless tobacco: Never Used  Substance and Sexual Activity  . Alcohol use: Yes    Alcohol/week: 8.4 oz    Types: 14 Glasses of wine per week    Comment: occas, one glass of wine before dinner,sometimes 1/2 glass  . Drug use: No  . Sexual activity: No  Other Topics Concern  . Not on file  Social History Narrative   Patient is right handed and  resides in home with wife    Functional Status Survey:    Family History  Problem Relation Age of Onset  . Heart failure Mother     Health Maintenance  Topic Date Due  . FOOT EXAM  09/28/2017 (Originally 06/06/1940)  . OPHTHALMOLOGY EXAM  09/28/2017 (Originally 06/06/1940)  . TETANUS/TDAP  09/29/2026 (Originally 12/20/2020)  . HEMOGLOBIN A1C  09/27/2017  . INFLUENZA VACCINE  Completed  . PNA vac Low Risk Adult  Completed    Allergies  Allergen Reactions  . Axona [Bacid]     Unknown per MAR  . Gabapentin     Hallucinations    Outpatient Encounter Medications as of 04/03/2017  Medication Sig  . acetaminophen (TYLENOL) 500 MG tablet Take 500 mg by mouth every 8 (eight) hours as needed (pain).   Marland Kitchen amoxicillin-clavulanate (AUGMENTIN) 875-125 MG tablet Take 1 tablet by mouth 2 (two) times daily for 3 days.  Marland Kitchen buPROPion (WELLBUTRIN SR) 100 MG 12 hr tablet Take 100 mg by mouth daily.  . carvedilol (COREG) 3.125 MG tablet Take 1 tablet (3.125 mg total) by mouth 2 (two) times daily with a meal.  . Cholecalciferol (VITAMIN D) 1000 UNITS capsule Take 1,000 Units by mouth daily.    Marland Kitchen donepezil (ARICEPT) 23 MG TABS tablet Take 1 tablet (23 mg total) by mouth daily.  . dorzolamide-timolol (COSOPT) 22.3-6.8 MG/ML ophthalmic solution Place 1 drop into the left eye 2 (two) times daily.   . Emollient (CERAVE) CREA Apply 1 application topically daily. Apply to arms, legs and trunk  . Flaxseed, Linseed, (FLAXSEED OIL) 1000 MG CAPS Take 1,000 mg by mouth daily.    . folic acid (FOLVITE) 474 MCG tablet Take 800 mcg by mouth every evening.   . furosemide (LASIX) 20 MG tablet Take 1 tablet (20 mg total) by mouth daily.  . hydrocortisone (PROCTOSOL HC) 2.5 % rectal cream Place 1 application rectally at bedtime as needed for hemorrhoids or anal itching.  Marland Kitchen lisinopril (PRINIVIL,ZESTRIL) 5 MG tablet Take 1 tablet (5 mg total) by mouth daily.  Marland Kitchen loperamide (IMODIUM A-D) 2 MG tablet Take 2 mg by mouth 4 (four)  times daily as needed for diarrhea or loose stools.   . memantine (NAMENDA XR) 28 MG CP24 24 hr capsule Take 1 capsule (28 mg total) by mouth daily.  . metFORMIN (GLUCOPHAGE) 500 MG tablet Take 500 mg by mouth daily.   . mirabegron ER (MYRBETRIQ) 50 MG TB24 tablet Take 50 mg by mouth daily.  . Multiple Vitamin (MULTIVITAMIN) tablet Take 1 tablet by mouth daily.    Marland Kitchen nystatin (NYSTATIN) powder Apply topically 2 (two) times daily as needed. Apply to redness and scrotal and sacral area  . Probiotic Product (ALIGN) 4 MG CAPS Take 4 mg by mouth daily.  . simvastatin (ZOCOR) 5 MG tablet Take 5 mg by mouth daily.   Marland Kitchen  ZINC OXIDE EX Apply 1 application topically 3 (three) times daily as needed.    No facility-administered encounter medications on file as of 04/03/2017.     Review of Systems  Unable to perform ROS: Dementia (limited)  Constitutional: Negative for appetite change and fever.  HENT: Positive for hearing loss. Negative for congestion and rhinorrhea.   Eyes: Positive for visual disturbance.  Respiratory: Negative for cough and shortness of breath.   Cardiovascular: Negative for chest pain and palpitations.  Gastrointestinal: Negative for abdominal pain, diarrhea and vomiting.  Genitourinary:       Has urinary incontinence  Musculoskeletal: Positive for gait problem.  Skin: Negative for rash.  Neurological: Negative for dizziness and headaches.  Hematological: Bruises/bleeds easily.  Psychiatric/Behavioral: Positive for confusion.    Vitals:   04/03/17 1509  BP: (!) 141/84  Pulse: 82  Resp: 20  Temp: 97.8 F (36.6 C)  TempSrc: Oral  SpO2: 96%  Weight: 176 lb 1.6 oz (79.9 kg)  Height: 5\' 11"  (1.803 m)   Body mass index is 24.56 kg/m.   Wt Readings from Last 3 Encounters:  04/03/17 176 lb 1.6 oz (79.9 kg)  04/01/17 181 lb 3.5 oz (82.2 kg)  03/06/17 173 lb 12.8 oz (78.8 kg)   Physical Exam  Constitutional: He appears well-developed and well-nourished. No distress.    HENT:  Head: Normocephalic and atraumatic.  Mouth/Throat: Oropharynx is clear and moist. No oropharyngeal exudate.  Eyes: Conjunctivae are normal. Right eye exhibits no discharge. Left eye exhibits no discharge.  Has corrective glasses  Neck: Neck supple.  Cardiovascular: Normal rate and regular rhythm.  Pulmonary/Chest: Effort normal. No respiratory distress. He has no wheezes. He has no rales.  Poor air movement   Abdominal: Soft. Bowel sounds are normal.  Musculoskeletal: Normal range of motion. He exhibits edema.  Can move all 4 extremities, limited ROM with shoulders. Unsteady gait. Uses wheelchair for ambulation. Trace leg edema. Chronic LE skin changes  Lymphadenopathy:    He has no cervical adenopathy.  Neurological: He is alert.  Oriented to self and place only  Skin: Skin is warm and dry. He is not diaphoretic.  Psychiatric:  Pleasantly confused, calm this visit    Labs reviewed: Basic Metabolic Panel: Recent Labs    11/28/16 2315  03/29/17 1240 03/30/17 0337 03/31/17 0602  NA  --    < > 136 139 141  K  --    < > 3.6 3.3* 3.6  CL  --    < > 103 110 112*  CO2  --    < > 25 22 22   GLUCOSE  --    < > 173* 127* 109*  BUN  --    < > 10 11 14   CREATININE 0.94   < > 1.09 1.00 1.05  CALCIUM  --    < > 9.0 8.1* 8.3*  MG 1.8  --   --   --   --    < > = values in this interval not displayed.   Liver Function Tests: Recent Labs    11/28/16 1129 03/29/17 1240 03/30/17 0337  AST 60* 25 20  ALT 37 25 17  ALKPHOS 73 113 75  BILITOT 1.5* 0.6 0.8  PROT 5.9* 6.8 5.3*  ALBUMIN 3.1* 3.8 2.8*   No results for input(s): LIPASE, AMYLASE in the last 8760 hours. No results for input(s): AMMONIA in the last 8760 hours. CBC: Recent Labs    06/22/16 1057  11/28/16 1129  03/29/17  1240 03/30/17 0337 03/31/17 0602  WBC 18.7*   < > 17.1*   < > 13.9* 6.4 6.7  NEUTROABS 16.0*  --  14.9*  --  12.6*  --   --   HGB 15.0   < > 14.0   < > 14.3 11.4* 11.0*  HCT 43.9   < > 40.4    < > 43.0 34.3* 33.7*  MCV 96.9   < > 94.4   < > 96.2 98.3 98.5  PLT 130*   < > 145*   < > 129* 100* 90*   < > = values in this interval not displayed.   Cardiac Enzymes: Recent Labs    11/28/16 2315 11/29/16 0232 11/29/16 0834  TROPONINI 0.17* 0.15* 0.09*   BNP: Invalid input(s): POCBNP Lab Results  Component Value Date   HGBA1C 6.5 (H) 03/29/2017   Lab Results  Component Value Date   TSH 2.40 06/26/2016   No results found for: VITAMINB12 No results found for: FOLATE No results found for: IRON, TIBC, FERRITIN  Imaging and Procedures obtained prior to SNF admission: Dg Chest Port 1 View  Result Date: 03/30/2017 CLINICAL DATA:  Sepsis.  Cough.  Shortness breath. EXAM: PORTABLE CHEST 1 VIEW COMPARISON:  One-view chest x-ray 03/29/2017 FINDINGS: The heart size is exaggerate by low lung volumes. Lungs are clear. The visualized soft tissues and bony thorax are unremarkable. IMPRESSION: Stable low lung volumes and cardiomegaly without acute cardiopulmonary disease. Electronically Signed   By: San Morelle M.D.   On: 03/30/2017 10:17   Dg Chest Portable 1 View  Result Date: 03/29/2017 CLINICAL DATA:  Unresponsive.  Cough.  Fever. EXAM: PORTABLE CHEST 1 VIEW COMPARISON:  11/28/2016. FINDINGS: Mediastinum hilar structures normal. Cardiomegaly with normal pulmonary vascularity. No focal infiltrate. No pleural effusion or pneumothorax. Degenerative change thoracic spine. IMPRESSION: Stable cardiomegaly .  No acute cardiopulmonary disease. Electronically Signed   By: Marcello Moores  Register   On: 03/29/2017 13:30   Dg Swallowing Func-speech Pathology  Result Date: 03/30/2017 Objective Swallowing Evaluation: Type of Study: MBS-Modified Barium Swallow Study  Patient Details Name: Jose [name] MRN: [id number] Date of Birth: 12-07-1930 Today's Date: 03/30/2017 Time: SLP Start Time (ACUTE ONLY): 1500 -SLP Stop Time (ACUTE ONLY): 1520 SLP Time Calculation (min) (ACUTE ONLY): 20 min Past Medical  History: Past Medical History: Diagnosis Date . Abnormality of gait  . Backache, unspecified  . CHF (congestive heart failure) ([place])  . Colon polyps   adenomatous . Dementia 11/12/2012 . Depression  . Diabetes mellitus without complication ([place])  . Diverticulosis  . Essential and other specified forms of tremor  . Hemorrhoids  . History of bilateral hip replacements 11/12/2012 . Memory loss  . Other persistent mental disorders due to conditions classified elsewhere  . Pain in joint, pelvic region and thigh  . Skin cancer of scalp  . Spinal stenosis, lumbar region, without neurogenic claudication  . Unspecified hereditary and idiopathic peripheral neuropathy  Past Surgical History: Past Surgical History: Procedure Laterality Date . CATARACT EXTRACTION, BILATERAL  2012 . JOINT REPLACEMENT   . RETINAL LASER PROCEDURE  2012  retinal wrinkle . SKIN CANCER EXCISION   . TOTAL HIP ARTHROPLASTY Left 2000 . TOTAL HIP ARTHROPLASTY (aka REPLACEMENT) Right 2011 HPI: 84 y.o.maleChronic systolic and diastolic CHF, EF 27-03%, overactive bladder, hyperlipidemia, hypertension, unsteady gait-wheelchair-bound, dementia, brought to the ED for evaluation of altered mental status noticed by his physical therapy at the nursing-rehab facility.Admitted with sepsis due to URI, respiratory virus panel positive for coronavirus.  CXR 03/30/17 without acute cardiopulmonary disease.  Subjective: alert, cooperative Assessment / Plan / Recommendation CHL IP CLINICAL IMPRESSIONS 03/30/2017 Clinical Impression Patient presents with mild oropharyngeal dysphagia. Oral stage mildly prolonged with weak lingual manipulation. Swallow initiation delayed to valleculae with all consistencies. Pt naturally swallows in chin tuck position which does appear to facilitate containment of smaller boluses in the vallecuale prior to swallow initiation. Of note, pt did have coughing episode during the exam which was not associated with penetration or aspiration.Trace  shallow penetration of thin with multiple consecutive straw sips. Pharyngeal stage characterized by reduced tongue base retraction, leading to mild residue in the valleculae with puree, thin liquids. Moderate residue with solids. With greater residue in the valleculae, subsequent deep penetration to vocal cords x1 and trace silent aspiration x1 with larger boluses of thin liquid wash, and penetration of nectar x1. Cued cough was not effective for clearance. Pt does tolerate small sips of thin liquid without airway compromise, particularly with less residue in the valleculae. Recommend dys 1, thin liquids via small cup sips, meds whole in puree. Will f/u for tolerance, education. SLP Visit Diagnosis Dysphagia, oropharyngeal phase (R13.12) Attention and concentration deficit following -- Frontal lobe and executive function deficit following -- Impact on safety and function Mild aspiration risk   CHL IP TREATMENT RECOMMENDATION 03/30/2017 Treatment Recommendations F/U MBS in --- days (Comment);Therapy as outlined in treatment plan below   Prognosis 03/30/2017 Prognosis for Safe Diet Advancement Fair Barriers to Reach Goals Cognitive deficits;Time post onset Barriers/Prognosis Comment -- CHL IP DIET RECOMMENDATION 03/30/2017 SLP Diet Recommendations Dysphagia 1 (Puree) solids;Thin liquid Liquid Administration via Cup;No straw Medication Administration Whole meds with puree Compensations Minimize environmental distractions;Slow rate;Small sips/bites Postural Changes --   CHL IP OTHER RECOMMENDATIONS 03/30/2017 Recommended Consults -- Oral Care Recommendations Oral care BID Other Recommendations --   CHL IP FOLLOW UP RECOMMENDATIONS 03/30/2017 Follow up Recommendations Skilled Nursing facility   [facility] IP FREQUENCY AND DURATION 03/30/2017 Speech Therapy Frequency (ACUTE ONLY) min 2x/week Treatment Duration 1 week      CHL IP ORAL PHASE 03/30/2017 Oral Phase Impaired Oral - Pudding Teaspoon -- Oral - Pudding Cup -- Oral -  Honey Teaspoon -- Oral - Honey Cup -- Oral - Nectar Teaspoon -- Oral - Nectar Cup -- Oral - Nectar Straw -- Oral - Thin Teaspoon -- Oral - Thin Cup -- Oral - Thin Straw -- Oral - Puree -- Oral - Mech Soft -- Oral - Regular Weak lingual manipulation;Delayed oral transit Oral - Multi-Consistency -- Oral - Pill -- Oral Phase - Comment --  CHL IP PHARYNGEAL PHASE 03/30/2017 Pharyngeal Phase Impaired Pharyngeal- Pudding Teaspoon -- Pharyngeal -- Pharyngeal- Pudding Cup -- Pharyngeal -- Pharyngeal- Honey Teaspoon -- Pharyngeal -- Pharyngeal- Honey Cup -- Pharyngeal -- Pharyngeal- Nectar Teaspoon -- Pharyngeal -- Pharyngeal- Nectar Cup -- Pharyngeal -- Pharyngeal- Nectar Straw Delayed swallow initiation-vallecula;Penetration/Aspiration during swallow;Reduced airway/laryngeal closure;Pharyngeal residue - valleculae;Reduced tongue base retraction Pharyngeal Material enters airway, remains ABOVE vocal cords and not ejected out Pharyngeal- Thin Teaspoon -- Pharyngeal -- Pharyngeal- Thin Cup Delayed swallow initiation-vallecula;Reduced tongue base retraction;Pharyngeal residue - valleculae Pharyngeal -- Pharyngeal- Thin Straw Delayed swallow initiation-pyriform sinuses;Reduced airway/laryngeal closure;Reduced tongue base retraction;Penetration/Aspiration during swallow;Trace aspiration;Pharyngeal residue - valleculae Pharyngeal Material enters airway, CONTACTS cords and not ejected out;Material enters airway, passes BELOW cords without attempt by patient to eject out (silent aspiration) Pharyngeal- Puree Delayed swallow initiation-vallecula;Pharyngeal residue - valleculae Pharyngeal -- Pharyngeal- Mechanical Soft -- Pharyngeal -- Pharyngeal- Regular Delayed swallow initiation-vallecula;Reduced tongue base retraction;Pharyngeal  residue - valleculae Pharyngeal -- Pharyngeal- Multi-consistency -- Pharyngeal -- Pharyngeal- Pill Delayed swallow initiation-vallecula;Pharyngeal residue - valleculae Pharyngeal -- Pharyngeal Comment --   CHL IP CERVICAL ESOPHAGEAL PHASE 03/30/2017 Cervical Esophageal Phase WFL Pudding Teaspoon -- Pudding Cup -- Honey Teaspoon -- Honey Cup -- Nectar Teaspoon -- Nectar Cup -- Nectar Straw -- Thin Teaspoon -- Thin Cup -- Thin Straw -- Puree -- Mechanical Soft -- Regular -- Multi-consistency -- Pill -- Cervical Esophageal Comment -- Deneise Lever, MS, CCC-SLP Speech-Language Pathologist [phone] No flowsheet data found. Aliene Altes 03/30/2017, 3:44 PM               Assessment/Plan  Generalized weakness From deconditioning from recent pneumonia. Will have patient work with PT/OT as tolerated to regain strength and restore function.  Fall precautions are in place.  Aspiration pneumonia Afebrile, breathing stable on room air. Aspiration precautions. SLP to follow. Dysphagia diet. Continue and complete course of augmentin on 04/04/17.   Dysphagia Aspiration precautions, puree feed with thin liquid for now, SLP to work with pt, remains high risk for aspiration with cognitive impairment  CHF Weight stable. Continue lisinopril and carvedilol for now with furosemide. 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 Continue metformin 500 mg daily for now  Dementia with behavioral disturbance Supportive care. Continue aricept and namenda with bupropion. monitor  OAB Continue myrbetriq for now  HLD Continue statin, no changes made   Family/ staff Communication: reviewed care plan with patient and charge nurse.    Labs/tests ordered: cbc with diff, bmp in 1 week.   [name], MD Internal Medicine [facility] [address] [place], [place] [address] Cell Phone (Monday-Friday 8 am - 5 pm): [phone] On Call: [phone] and follow prompts after 5 pm and on weekends Office Phone: [phone] Office Fax: [phone]

## 2017-04-08 ENCOUNTER — Ambulatory Visit: Payer: Medicare Other | Admitting: Neurology

## 2017-04-08 ENCOUNTER — Encounter: Payer: Self-pay | Admitting: Neurology

## 2017-04-08 VITALS — BP 132/86 | HR 76

## 2017-04-08 DIAGNOSIS — F0280 Dementia in other diseases classified elsewhere without behavioral disturbance: Secondary | ICD-10-CM

## 2017-04-08 DIAGNOSIS — Z9289 Personal history of other medical treatment: Secondary | ICD-10-CM

## 2017-04-08 NOTE — Patient Instructions (Addendum)
We will keep you on your 2 memory medications.  We will see you routinely in 6 months.

## 2017-04-08 NOTE — Progress Notes (Signed)
Subjective:    Patient ID: Jose [name] is a 84 y.o. male.  HPI     Interim history:   Jose [name] is a very friendly 84 year old right-handed gentleman with an underlying medical history of left eye retinal disease, arthritis and status post L THR in 2000, R THR in 2011, ET with a positive FHx, PN, HLP, HTN, LBP, depression, reflux disease, and memory loss, who presents for followup consultation of his dementia without behavioral disturbance. He is accompanied by his daughter today. I last saw him on 10/01/16, at which time his wife reported a recent fall. He sustained a hip fracture in March 2018. He had hip replacement surgeries years ago. He was in skilled nursing. He was mostly confined to his wheelchair at the time. We continued his dementia medications. His MMSE in July 2018 was 19 out of 30.   Today, 04/08/2017 (all dictated new, as well as above notes, some dictation done in note pad or Word, outside of chart, may appear as copied):    He reports very little on his own. Daughter reports, he seems stable. He still has good long-term memory she feels. 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 She stays at her parents' house during the week and on the weekend she goes to her own home in [place]. Unfortunately, he had a recent hospitalization secondary to sepsis in the context of a upper respiratory infection. I reviewed the hospital records. He was admitted on 03/29/2017 from his skilled nursing facility and discharged on 04/01/2017. He was placed on a dysphagia diet. Per daughter, he is fairly stable with his memory function.   The patient's allergies, current medications, family history, past medical history, past social history, past surgical history and problem list were reviewed and updated as appropriate.    Previously (copied from previous notes for reference):   I saw him on 01/19/2016, at which time his wife reported that he was sleeping  better, less during the day. The transitions to friend's home, independent living. He was not exercising regularly and like to drink tea, not enough water. Memory per wife was mildly worse. His MMSE was 21 which was actually stable from before. I suggested he continue with long-acting Namenda 28 mg daily and Aricept 23 mg daily.    I saw him on 07/21/2015, at which time he reported doing okay. He had not been sleeping well at night and was napping during the day. He was given a medication by PCP, but had not started it did not recall its name. He had a tendency to wander at night. Twice he triggered the house alarm at night. They had a sitter during the day and also for overnight but after 2 weeks they decided to forego this. They were in the process of moving to [place] to independent living. He had been taken off of gabapentin which helped the daytime somnolence and nighttime behaviors. Moodwise and memory wise he was stable. Was not drinking enough water. His MMSE was 20/30, CDT: 4/4, AFT: 9/min. I suggested he continue with his memory medications, I suggested he try melatonin at night for sleep. They were reminded to call back with the medication Dr. Joylene Draft had suggested for sleep. His wife called on 07/27/2015 reporting that he had been prescribed Seroquel. I suggested we try to hold off on this for now.   I saw him on 01/20/2015, at which time he reported doing fairly  well, his wife reported that he was less motivated, somewhat weaker overall, but still was working with a trainer 3 times a week, half an hour at a time, mostly working on balance, walking and light weight training. He had lower extremity swelling which was stable, memory scores were stable, he was on maximum doses of long-acting Namenda and Aricept and I suggested he continue with medications. MMSE was 20 out of 30 at the time.    I saw him on 07/16/2014, at which time his MMSE was 20, clock drawing was 4, animal fluency was 16.  He reported a recent syncopal spell. His wife reported that he had just gotten out of the shower was in the process of dressing himself, sitting on a bench in his room. When she left the room he slumped back onto his bed and was unresponsive. She called 911. She did report that he had a tendency to take very hot showers. He does not always drink enough water. He had been off of his blood pressure medication. They had talked about using compression stockings to his lower extremities but his problem was that they were difficult to get on. Memory was stable. His leg swelling had become worse because he was no longer on a diuretic. I suggested no medication changes. I asked him to continue his long-acting Namenda and Aricept at 23 mg daily. I advised him to drink more water and consider compression stockings to the lower extremities.   I saw him on 11/12/13, at which time his wife reported that he was sleeping more and more and not motivated to do things. He fell once as he held onto the towel rack in the bathroom and tore the rack off the wall and bumped his head. No laceration, no HA, no LOC, no injuries. His memory scores were stable and I kept him on the same medications. In the interim, on 02/21/2014 he presented to the emergency room with painless rectal bleeding. I reviewed the emergency room records. He was hemodynamically stable. He was set up with GI follow-up.   In the interim, he presented to the emergency room again on 06/24/2014 with near syncope versus actual syncopal event at home. I reviewed the emergency room records. His wife called EMS and the blood pressure was reportedly 82/56 and blood sugar level was 97. His creatinine was 1.44 and this was up from his baseline. He was given IV fluids and improved. His blood pressure improved.   I saw him on 05/14/2013, at which time I did not make any medication changes. He was doing well per wife. He continued to be active. I did encourage him to drink  more water. I kept him on long-acting Namenda 28 mg and Aricept 23 mg daily.   I first met him on 11/12/2012, which time I felt that his MMSE was a little worse and his category fluency was also less than before. His MMSE score was 23/30, CDT was 4/4, AFT (Animal Fluency Test) score was 15. He has been on maximum doses of Aricept and Namenda and he did not qualify for the clinical trial for dementia. I was reluctant to switch him to another anti-cholinergic medication for fear of side effects.   He previously followed with Dr. Morene Antu and was last seen by him on 05/14/2012, which time Dr. Erling Cruz felt that he was a potential candidate for a clinical trial. The patient's MMSE score was 26, clock drawing was 4 out of 4, and his falls assessment  tool score was 12.   He started having progressive memory loss in April 2000. This was after his hip surgery. His second hip surgery was done under spinal anesthesia with good results. EMG and nerve conduction studies as well as lumbar spine MRI in November 2012 showed multilevel spondylosis, as moderately severe canal stenosis and broad-based disc bulge at L1-2. B12 level, RPR, TSH were normal. MRI brain with and without contrast on February 2004 showed atrophy and small vessel disease. He had a repeat brain MRI in October 2012. He has been on Aricept since 2004 and [place] since March 2005. He also has peripheral neuropathy for which he had evaluation in January 2009 with SPEP, ACE level, vitamin B12, hemoglobin A1c, PSA, TSH, lipids, CBC, CMP, urinalysis, all normal in February 2010. In December 2012 his MMSE was 34, and in April 2013 his MMSE was 22, clock drawing was 4, animal fluency was 18.   He was started on low dose Lexapro by Dr. Joylene Draft. The patient has had a tendency to pick at his skin lesions on the scalp. His son and daughter are local and involved. 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 He no longer drives. He goes for aqua exercises 3 times/week. He has  had no VH/AH. Eating too much and forgetting that he ate has been an issue per wife. He had a sleep study in the past in 2013, which was negative for OSA per wife.    His Past Medical History Is Significant For: Past Medical History:  Diagnosis Date  . Abnormality of gait   . Backache, unspecified   . CHF (congestive heart failure) ([place])   . Colon polyps    adenomatous  . Dementia 11/12/2012  . Depression   . Diabetes mellitus without complication ([place])   . Diverticulosis   . Essential and other specified forms of tremor   . Hemorrhoids   . History of bilateral hip replacements 11/12/2012  . Memory loss   . 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 Pain in joint, pelvic region and thigh   . Skin cancer of scalp   . Spinal stenosis, lumbar region, without neurogenic claudication   . Unspecified hereditary and idiopathic peripheral neuropathy     His Past Surgical History Is Significant For: Past Surgical History:  Procedure Laterality Date  . CATARACT EXTRACTION, BILATERAL  2012  . JOINT REPLACEMENT    . RETINAL LASER PROCEDURE  2012   retinal wrinkle  . SKIN CANCER EXCISION    . TOTAL HIP ARTHROPLASTY Left 2000  . TOTAL HIP ARTHROPLASTY (aka REPLACEMENT) Right 2011    His Family History Is Significant For: Family History  Problem Relation Age of Onset  . Heart failure Mother     His Social History Is Significant For: Social History   Socioeconomic History  . Marital status: Married    Spouse name: [name]  . Number of children: 2  . Years of education: Chief Executive Officer  . Highest education level: None  Social Needs  . Financial resource strain: None  . Food insecurity - worry: None  . Food insecurity - inability: None  . Transportation needs - medical: None  . Transportation needs - non-medical: None  Occupational History  . Occupation: Retired    Fish farm manager: [employer]    Comment: retired  Tobacco Use  . Smoking status: Former  Smoker    Types: Cigarettes  . Smokeless tobacco: Never Used  Substance and Sexual Activity  .  Alcohol use: Yes    Alcohol/week: 8.4 oz    Types: 14 Glasses of wine per week    Comment: occas, one glass of wine before dinner,sometimes 1/2 glass  . Drug use: No  . Sexual activity: No  Other Topics Concern  . None  Social History Narrative   Patient is right handed and resides in home with wife    His Allergies Are:  Allergies  Allergen Reactions  . Axona [Bacid]     Unknown per MAR  . Gabapentin     Hallucinations  :   His Current Medications Are:  Outpatient Encounter Medications as of 04/08/2017  Medication Sig  . acetaminophen (TYLENOL) 500 MG tablet Take 500 mg by mouth every 8 (eight) hours as needed (pain).   Marland Kitchen buPROPion (WELLBUTRIN SR) 100 MG 12 hr tablet Take 100 mg by mouth daily.  . carvedilol (COREG) 3.125 MG tablet Take 1 tablet (3.125 mg total) by mouth 2 (two) times daily with a meal.  . Cholecalciferol (VITAMIN D) 1000 UNITS capsule Take 1,000 Units by mouth daily.    Marland Kitchen donepezil (ARICEPT) 23 MG TABS tablet Take 1 tablet (23 mg total) by mouth daily.  . dorzolamide-timolol (COSOPT) 22.3-6.8 MG/ML ophthalmic solution Place 1 drop into the left eye 2 (two) times daily.   . Emollient (CERAVE) CREA Apply 1 application topically daily. Apply to arms, legs and trunk  . Flaxseed, Linseed, (FLAXSEED OIL) 1000 MG CAPS Take 1,000 mg by mouth daily.    . folic acid (FOLVITE) 330 MCG tablet Take 800 mcg by mouth every evening.   . furosemide (LASIX) 20 MG tablet Take 1 tablet (20 mg total) by mouth daily.  . hydrocortisone (PROCTOSOL HC) 2.5 % rectal cream Place 1 application rectally at bedtime as needed for hemorrhoids or anal itching.  Marland Kitchen lisinopril (PRINIVIL,ZESTRIL) 5 MG tablet Take 1 tablet (5 mg total) by mouth daily.  Marland Kitchen loperamide (IMODIUM A-D) 2 MG tablet Take 2 mg by mouth 4 (four) times daily as needed for diarrhea or loose stools.   . memantine (NAMENDA XR) 28 MG  CP24 24 hr capsule Take 1 capsule (28 mg total) by mouth daily.  . metFORMIN (GLUCOPHAGE) 500 MG tablet Take 500 mg by mouth daily.   . mirabegron ER (MYRBETRIQ) 50 MG TB24 tablet Take 50 mg by mouth daily.  . Multiple Vitamin (MULTIVITAMIN) tablet Take 1 tablet by mouth daily.    Marland Kitchen nystatin (NYSTATIN) powder Apply topically 2 (two) times daily as needed. Apply to redness and scrotal and sacral area  . Probiotic Product (ALIGN) 4 MG CAPS Take 4 mg by mouth daily.  . simvastatin (ZOCOR) 5 MG tablet Take 5 mg by mouth daily.   Marland Kitchen ZINC OXIDE EX Apply 1 application topically 3 (three) times daily as needed.    No facility-administered encounter medications on file as of 04/08/2017.   :  Review of Systems:  Out of a complete 14 point review of systems, all are reviewed and negative with the exception of these symptoms as listed below: Review of Systems  Neurological:       Pt presents today to discuss his memory. Pt and daughter feel that his memory is stable. Pt is rhyming his words more. [facility] is living at [facility].    Objective:  Neurological Exam  Physical Exam Physical Examination:   Vitals:   04/08/17 1121  BP: 132/86  Pulse: 76    General Examination: The patient is a very pleasant 84  y.o. male in no acute distress. He appears frail, well groomed. In [place].  HEENT:Normocephalic, atraumatic, pupils are equal, round and reactive to light and accommodation. Extraocular tracking shows mild saccadic breakdown without nystagmus noted. Hearing is intact, perhaps mildly impaired. Face is symmetric with no facial masking and normal facial sensation. There is no lip, neck or jaw tremor. Neck is not rigid with intact passive ROM. There are no carotid bruits on auscultation. Oropharynx exam reveals mild to moderate mouth dryness. No significant airway crowding is noted. Tongue protrudes centrally and palate elevates symmetrically.   Chest:is clear to auscultation without wheezing,  rhonchi or crackles noted.  Heart:sounds are regular and normal without murmurs, rubs or gallops noted.   Abdomen:is soft, non-tender and non-distended with normal bowel sounds appreciated on auscultation.  Extremities:There is mild edema in the distal lower extremities.  Skin: is warm and dry with no trophic changes noted. He has multiple scars on his scalp. He has precancerous lesions which are usually taken off by his dermatologist.  Musculoskeletal: exam reveals no obvious joint deformities, tenderness or joint swelling or erythema.  Neurologically:  Mental status: The patient is awake and alert, paying fair attention. He is not able to provide the history. His daughter provides the Hx. He is oriented to: person, place and day of week. His memory, attention, language and knowledge are impaired. There is no aphasia, agnosia, apraxia or anomia. There is a mild degree of bradyphrenia. Speech is mildly hypophonic with no dysarthria noted. Mood is congruent and affect is normal.   On 11/12/12: His MMSE score was 23/30. CDT was 4/4. AFT (Animal Fluency Test) score was 15.  On 05/14/13: MMSE was 19/30, CDT 4/4, AFT 15. On 11/12/2013: MMSE is 20/30, CDT 4/4, AFT 16.  On 07/16/2014: MMSE: 20/30, CDT: 4/4, AFT: 16/min. On 01/20/2015: MMSE: 20/30, CDT: 3/4, AFT: 12/min. On 07/21/2015: MMSE: 20/30, CDT: 4/4, AFT: 9/min. On 01/19/2016: MMSE: 21/30, CDT: 3/4, AFT: 9/min.  On 10/01/2016: MMSE: 19/30, CDT: 4/4, AFT: 6/min. On 04/08/2017: MMSE: deferred d/t recent hospitalization for AMS in the context of sepsis.   Cranial nerves are as described above under HEENT exam. In addition, shoulder shrug is normal with equal shoulder height noted.  Motor exam: Thin bulk, global strength of 4 out of 5, no cogwheeling noted, no increase in tone. No resting tremor. Reflexes are 1+ in the upper extremities and trace in the lower extremities, fine motor skills mild to moderately impaired globally. Romberg is  not testable safely today.   Sensory exam is intact to light touch.   Gait, station and balance: He is not able to stand or walk for me safely, in WC.    Assessment and Plan:   In summary, Jose [name] is a very pleasant 83 year old male with an underlying medical history of chronic back pain, arthritis, depression, reflux disease, and hypertension who presents for follow-up consultation of his long-standing history of dementia without behavioral disturbance. He was diagnosed over 17 years ago or so. He has had no problems tolerating his medications, is on dual medication with Namenda XR 28 mg daily and generic Aricept 23 mg daily, both at max doses. Unfortunately, he fell in March 2018, fractured his left hip, this was a significant setback for him. He is at SNF at this time, recent hospitalization secondary to sepsis secondary to URI which caused altered mental status. Overall, his memory scores have been fairly stable around 19 and 20 out of 30 in the past 2  or 3 years. I suggested 6 month follow-up with one of our NPs. Both his daughter and his son live close by and help out, thankfully. They have grandchildren in [place] and [place]. Thankfully he has a very caring and involved family, a particularly dedicated wife, good support system overall.  I answered all their questions today and the patient and his daughter were in agreement. I spent 20 minutes in total face-to-face time with the patient, more than 50% of which was spent in counseling and coordination of care, reviewing test results, reviewing medication and discussing or reviewing the diagnosis of dementia, its prognosis and treatment options. Pertinent laboratory and imaging test results that were available during this visit with the patient were reviewed by me and considered in my medical decision making (see chart for details).

## 2017-04-11 ENCOUNTER — Telehealth: Payer: Self-pay | Admitting: Cardiovascular Disease

## 2017-04-11 NOTE — Telephone Encounter (Signed)
Called [name] from [facility], left message for her to call back.

## 2017-04-11 NOTE — Telephone Encounter (Signed)
[facility] [facility] Jose [name] [facility] . They have been doing daily weight and his weight has been stable and want to know if they can do monthly weight versus the daily . His CHF is also under control . He has been with them since September. Please call   Thanks

## 2017-04-16 ENCOUNTER — Encounter: Payer: Self-pay | Admitting: Family

## 2017-04-16 ENCOUNTER — Non-Acute Institutional Stay (SKILLED_NURSING_FACILITY): Payer: Medicare Other | Admitting: Family

## 2017-04-16 DIAGNOSIS — R1312 Dysphagia, oropharyngeal phase: Secondary | ICD-10-CM

## 2017-04-16 DIAGNOSIS — F418 Other specified anxiety disorders: Secondary | ICD-10-CM

## 2017-04-16 DIAGNOSIS — E1122 Type 2 diabetes mellitus with diabetic chronic kidney disease: Secondary | ICD-10-CM | POA: Diagnosis not present

## 2017-04-16 DIAGNOSIS — G309 Alzheimer's disease, unspecified: Secondary | ICD-10-CM

## 2017-04-16 DIAGNOSIS — F0280 Dementia in other diseases classified elsewhere without behavioral disturbance: Secondary | ICD-10-CM | POA: Diagnosis not present

## 2017-04-16 DIAGNOSIS — I5042 Chronic combined systolic (congestive) and diastolic (congestive) heart failure: Secondary | ICD-10-CM | POA: Diagnosis not present

## 2017-04-16 DIAGNOSIS — E440 Moderate protein-calorie malnutrition: Secondary | ICD-10-CM

## 2017-04-16 DIAGNOSIS — R634 Abnormal weight loss: Secondary | ICD-10-CM | POA: Diagnosis not present

## 2017-04-16 NOTE — Progress Notes (Signed)
Location:  [facility] [place] Room Number: 41 Place of Service:  SNF (31) Provider: Aastha Dayley FNP-C   [name], MD  Patient Care Team: [name], MD as PCP - General (Internal Medicine) Rosan Calbert, [name], NP as Nurse Practitioner (Family Medicine)  Extended Emergency Contact Information Primary Emergency Contact: [name] Address: [place], [place] Phone: [phone] Mobile Phone: [phone] Relation: Spouse Secondary Emergency Contact: [name] [place] Phone: [phone] Work Phone: [phone] Relation: Daughter  Code Status:  Full Code  Goals of care: Advanced Directive information Advanced Directives 04/16/2017  Does Patient Have a Medical Advance Directive? Yes  Type of Advance Directive Living will;Healthcare Power of Attorney  Does patient want to make changes to medical advance directive? -  Copy of [place] in Chart? Yes  Would patient like information on creating a medical advance directive? -  Pre-existing out of facility DNR order (yellow form or pink MOST form) -     Chief Complaint  Patient presents with  . Medical Management of Chronic Issues    Routine visit    HPI:  Pt is a 84 y.o. male seen today [place] for medical management of chronic diseases.He has a medical history of CHF,Hyperlipidemia ,Type 2 DM, Depression,Anxiety,OAB,Dementia without behavioral disturbance among other conditions.He is seen in his room today.he denies any acute issues this visit.Facility Nurse reports no new concerns. He has had no recent fall episode.He continues to work with PT/OT for muscle strengthening and gait stability.He also following up with Speech therapy for dysphagia.He is currently on mechanical soft diet.He has had a progressive weight loss: Wt 182.9 ( 02/14/2017);Wt 180.4 ( 03/16/2017);Wt 171.9 ( 04/16/2016). He continues to  follow up with Facility Registered Dietician.    Past Medical History:  Diagnosis Date  . Abnormality of gait   . Backache, unspecified   . CHF (congestive heart failure) ([place])   . Colon polyps    adenomatous  . Dementia 11/12/2012  . Depression   . Diabetes mellitus without complication ([place])   . Diverticulosis   . Essential and other specified forms of tremor   . Hemorrhoids   . History of bilateral hip replacements 11/12/2012  . Memory loss   . 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 Pain in joint, pelvic region and thigh   . Skin cancer of scalp   . Spinal stenosis, lumbar region, without neurogenic claudication   . Unspecified hereditary and idiopathic peripheral neuropathy    Past Surgical History:  Procedure Laterality Date  . CATARACT EXTRACTION, BILATERAL  2012  . JOINT REPLACEMENT    . RETINAL LASER PROCEDURE  2012   retinal wrinkle  . SKIN CANCER EXCISION    . TOTAL HIP ARTHROPLASTY Left 2000  . TOTAL HIP ARTHROPLASTY (aka REPLACEMENT) Right 2011    Allergies  Allergen Reactions  . Axona [Bacid]     Unknown per MAR  . Gabapentin     Hallucinations    Allergies as of 04/16/2017      Reactions   Axona [bacid]    Unknown per [facility]   Gabapentin    Hallucinations      Medication List        Accurate as of 04/16/17  1:46 PM. Always use your most recent med list.          acetaminophen 500  MG tablet Commonly known as:  TYLENOL Take 500 mg by mouth every 8 (eight) hours as needed (pain).   ALIGN 4 MG Caps Take 4 mg by mouth daily.   buPROPion 100 MG 12 hr tablet Commonly known as:  WELLBUTRIN SR Take 100 mg by mouth daily.   carvedilol 3.125 MG tablet Commonly known as:  COREG Take 1 tablet (3.125 mg total) by mouth 2 (two) times daily with a meal.   CERAVE Crea Apply 1 application topically daily as needed. Apply to arms, legs and trunk   donepezil 23 MG Tabs tablet Commonly known as:  ARICEPT Take 1  tablet (23 mg total) by mouth daily.   dorzolamide-timolol 22.3-6.8 MG/ML ophthalmic solution Commonly known as:  COSOPT Place 1 drop into the left eye 2 (two) times daily.   Flaxseed Oil 1000 MG Caps Take 1,000 mg by mouth daily.   folic acid 063 MCG tablet Commonly known as:  FOLVITE Take 800 mcg by mouth every evening.   furosemide 20 MG tablet Commonly known as:  LASIX Take 1 tablet (20 mg total) by mouth daily.   lisinopril 5 MG tablet Commonly known as:  PRINIVIL,ZESTRIL Take 1 tablet (5 mg total) by mouth daily.   loperamide 2 MG tablet Commonly known as:  IMODIUM A-D Take 2 mg by mouth 4 (four) times daily as needed for diarrhea or loose stools.   memantine 28 MG Cp24 24 hr capsule Commonly known as:  NAMENDA XR Take 1 capsule (28 mg total) by mouth daily.   metFORMIN 500 MG tablet Commonly known as:  GLUCOPHAGE Take 500 mg by mouth daily.   multivitamin tablet Take 1 tablet by mouth daily.   MYRBETRIQ 50 MG Tb24 tablet Generic drug:  mirabegron ER Take 50 mg by mouth daily.   nystatin powder Generic drug:  nystatin Apply topically 2 (two) times daily as needed. Apply to redness and scrotal and sacral area   PROCTOSOL HC 2.5 % rectal cream Generic drug:  hydrocortisone Place 1 application rectally at bedtime as needed for hemorrhoids or anal itching.   simvastatin 5 MG tablet Commonly known as:  ZOCOR Take 5 mg by mouth daily.   Vitamin D 1000 units capsule Take 1,000 Units by mouth daily.   ZINC OXIDE EX Apply 1 application topically 3 (three) times daily as needed.       Review of Systems  Unable to perform ROS: Dementia  Constitutional: Negative for chills, fatigue and fever.       Progressive weight loss as above.   HENT: Positive for hearing loss and trouble swallowing. Negative for congestion, rhinorrhea, sinus pressure, sinus pain, sneezing and sore throat.   Eyes: Positive for visual disturbance. Negative for redness and itching.        Wears eye glasses   Respiratory: Negative for cough, chest tightness, shortness of breath and wheezing.   Cardiovascular: Negative for chest pain, palpitations and leg swelling.  Gastrointestinal: Negative for abdominal distention, abdominal pain, constipation, diarrhea, nausea and vomiting.  Endocrine: Negative for cold intolerance, heat intolerance, polydipsia, polyphagia and polyuria.  Genitourinary: Negative for flank pain and urgency.  Musculoskeletal: Positive for gait problem.  Skin: Negative for color change, pallor, rash and wound.  Neurological: Negative for dizziness, syncope, light-headedness and headaches.  Hematological: Does not bruise/bleed easily.  Psychiatric/Behavioral: Negative for agitation, confusion, hallucinations and sleep disturbance. The patient is not nervous/anxious.     Immunization History  Administered Date(s) Administered  . Influenza-Unspecified 01/10/2017  . PPD  Test 06/24/2016   Pertinent  Health Maintenance Due  Topic Date Due  . FOOT EXAM  09/28/2017 (Originally 06/06/1940)  . OPHTHALMOLOGY EXAM  09/28/2017 (Originally 06/06/1940)  . HEMOGLOBIN A1C  09/27/2017  . INFLUENZA VACCINE  Completed  . PNA vac Low Risk Adult  Completed   Fall Risk  12/07/2016 01/19/2016 07/21/2015  Falls in the past year? No Yes Yes  Number falls in past yr: - 1 1  Injury with Fall? - No No  Risk for fall due to : - Impaired balance/gait -  Follow up - Falls prevention discussed Falls prevention discussed    Vitals:   04/16/17 1206  BP: 92/64  Pulse: 98  Resp: 16  Temp: (!) 96.8 F (36 C)  SpO2: 98%  Weight: 171 lb 14.4 oz (78 kg)  Height: 5\' 11"  (1.803 m)   Body mass index is 23.98 kg/m. Physical Exam  Constitutional: He appears well-developed and well-nourished. No distress.  HENT:  Head: Normocephalic.  Right Ear: External ear normal.  Left Ear: External ear normal.  Mouth/Throat: Oropharynx is clear and moist. No oropharyngeal exudate.  Eyes:  Conjunctivae and EOM are normal. Pupils are equal, round, and reactive to light. Right eye exhibits no discharge. Left eye exhibits no discharge.  Eye glasses in place   Neck: Normal range of motion. No JVD present. No thyromegaly present.  Cardiovascular: Normal rate, regular rhythm, normal heart sounds and intact distal pulses. Exam reveals no gallop and no friction rub.  No murmur heard. Pulmonary/Chest: Effort normal and breath sounds normal. No respiratory distress. He has no wheezes. He has no rales.  Abdominal: Soft. Bowel sounds are normal. He exhibits no distension. There is no tenderness. There is no rebound and no guarding.  Genitourinary:  Genitourinary Comments: Incontinent   Musculoskeletal: He exhibits no tenderness.  Unsteady gait uses FWW with assistance but spends most time on wheelchair. Bilateral lower extremities trace edema knee high stockings in place.   Lymphadenopathy:    He has no cervical adenopathy.  Neurological: Coordination normal.  Alert and oriented to person and place " [facility] [place]".   Skin: Skin is warm and dry. No rash noted. No erythema. No pallor.  Psychiatric: He has a normal mood and affect. His behavior is normal.   Labs reviewed: Recent Labs    11/28/16 2315  03/29/17 1240 03/30/17 0337 03/31/17 0602  NA  --    < > 136 139 141  K  --    < > 3.6 3.3* 3.6  CL  --    < > 103 110 112*  CO2  --    < > 25 22 22   GLUCOSE  --    < > 173* 127* 109*  BUN  --    < > 10 11 14   CREATININE 0.94   < > 1.09 1.00 1.05  CALCIUM  --    < > 9.0 8.1* 8.3*  MG 1.8  --   --   --   --    < > = values in this interval not displayed.   Recent Labs    11/28/16 1129 03/29/17 1240 03/30/17 0337  AST 60* 25 20  ALT 37 25 17  ALKPHOS 73 113 75  BILITOT 1.5* 0.6 0.8  PROT 5.9* 6.8 5.3*  ALBUMIN 3.1* 3.8 2.8*   Recent Labs    06/22/16 1057  11/28/16 1129  03/29/17 1240 03/30/17 0337 03/31/17 0602  WBC 18.7*   < >  17.1*    < > 13.9* 6.4 6.7  NEUTROABS 16.0*  --  14.9*  --  12.6*  --   --   HGB 15.0   < > 14.0   < > 14.3 11.4* 11.0*  HCT 43.9   < > 40.4   < > 43.0 34.3* 33.7*  MCV 96.9   < > 94.4   < > 96.2 98.3 98.5  PLT 130*   < > 145*   < > 129* 100* 90*   < > = values in this interval not displayed.   Lab Results  Component Value Date   TSH 2.40 06/26/2016   Lab Results  Component Value Date   HGBA1C 6.5 (H) 03/29/2017   Lab Results  Component Value Date   CHOL 103 01/31/2017   HDL 49 01/31/2017   LDLCALC 41 01/31/2017   TRIG 47 01/31/2017    Significant Diagnostic Results in last 30 days:  Dg Chest Port 1 View  Result Date: 03/30/2017 CLINICAL DATA:  Sepsis.  Cough.  Shortness breath. EXAM: PORTABLE CHEST 1 VIEW COMPARISON:  One-view chest x-ray 03/29/2017 FINDINGS: The heart size is exaggerate by low lung volumes. Lungs are clear. The visualized soft tissues and bony thorax are unremarkable. IMPRESSION: Stable low lung volumes and cardiomegaly without acute cardiopulmonary disease. Electronically Signed   By: San Morelle M.D.   On: 03/30/2017 10:17   Dg Chest Portable 1 View  Result Date: 03/29/2017 CLINICAL DATA:  Unresponsive.  Cough.  Fever. EXAM: PORTABLE CHEST 1 VIEW COMPARISON:  11/28/2016. FINDINGS: Mediastinum hilar structures normal. Cardiomegaly with normal pulmonary vascularity. No focal infiltrate. No pleural effusion or pneumothorax. Degenerative change thoracic spine. IMPRESSION: Stable cardiomegaly .  No acute cardiopulmonary disease. Electronically Signed   By: Marcello Moores  Register   On: 03/29/2017 13:30   Dg Swallowing Func-speech Pathology  Result Date: 03/30/2017 Objective Swallowing Evaluation: Type of Study: MBS-Modified Barium Swallow Study  Patient Details Name: DESEAN HEEMSTRA MRN: [id number] Date of Birth: Aug 24, 1930 Today's Date: 03/30/2017 Time: SLP Start Time (ACUTE ONLY): 1500 -SLP Stop Time (ACUTE ONLY): 1520 SLP Time Calculation (min) (ACUTE ONLY): 20 min Past  Medical History: Past Medical History: Diagnosis Date . Abnormality of gait  . Backache, unspecified  . CHF (congestive heart failure) ([place])  . Colon polyps   adenomatous . Dementia 11/12/2012 . Depression  . Diabetes mellitus without complication ([place])  . Diverticulosis  . Essential and other specified forms of tremor  . Hemorrhoids  . History of bilateral hip replacements 11/12/2012 . Memory loss  . Other persistent mental disorders due to conditions classified elsewhere  . Pain in joint, pelvic region and thigh  . Skin cancer of scalp  . Spinal stenosis, lumbar region, without neurogenic claudication  . Unspecified hereditary and idiopathic peripheral neuropathy  Past Surgical History: Past Surgical History: Procedure Laterality Date . CATARACT EXTRACTION, BILATERAL  2012 . JOINT REPLACEMENT   . RETINAL LASER PROCEDURE  2012  retinal wrinkle . SKIN CANCER EXCISION   . TOTAL HIP ARTHROPLASTY Left 2000 . TOTAL HIP ARTHROPLASTY (aka REPLACEMENT) Right 2011 HPI: 84 y.o.maleChronic systolic and diastolic CHF, EF 19-50%, overactive bladder, hyperlipidemia, hypertension, unsteady gait-wheelchair-bound, dementia, brought to the ED for evaluation of altered mental status noticed by his physical therapy at the nursing-rehab facility.Admitted with sepsis due to URI, respiratory virus panel positive for coronavirus. CXR 03/30/17 without acute cardiopulmonary disease.  Subjective: alert, cooperative Assessment / Plan / Recommendation CHL IP CLINICAL IMPRESSIONS 03/30/2017 Clinical Impression Patient presents with  mild oropharyngeal dysphagia. Oral stage mildly prolonged with weak lingual manipulation. Swallow initiation delayed to valleculae with all consistencies. Pt naturally swallows in chin tuck position which does appear to facilitate containment of smaller boluses in the vallecuale prior to swallow initiation. Of note, pt did have coughing episode during the exam which was not associated with penetration or  aspiration.Trace shallow penetration of thin with multiple consecutive straw sips. Pharyngeal stage characterized by reduced tongue base retraction, leading to mild residue in the valleculae with puree, thin liquids. Moderate residue with solids. With greater residue in the valleculae, subsequent deep penetration to vocal cords x1 and trace silent aspiration x1 with larger boluses of thin liquid wash, and penetration of nectar x1. Cued cough was not effective for clearance. Pt does tolerate small sips of thin liquid without airway compromise, particularly with less residue in the valleculae. Recommend dys 1, thin liquids via small cup sips, meds whole in puree. Will f/u for tolerance, education. SLP Visit Diagnosis Dysphagia, oropharyngeal phase (R13.12) Attention and concentration deficit following -- Frontal lobe and executive function deficit following -- Impact on safety and function Mild aspiration risk   CHL IP TREATMENT RECOMMENDATION 03/30/2017 Treatment Recommendations F/U MBS in --- days (Comment);Therapy as outlined in treatment plan below   Prognosis 03/30/2017 Prognosis for Safe Diet Advancement Fair Barriers to Reach Goals Cognitive deficits;Time post onset Barriers/Prognosis Comment -- CHL IP DIET RECOMMENDATION 03/30/2017 SLP Diet Recommendations Dysphagia 1 (Puree) solids;Thin liquid Liquid Administration via Cup;No straw Medication Administration Whole meds with puree Compensations Minimize environmental distractions;Slow rate;Small sips/bites Postural Changes --   CHL IP OTHER RECOMMENDATIONS 03/30/2017 Recommended Consults -- Oral Care Recommendations Oral care BID Other Recommendations --   CHL IP FOLLOW UP RECOMMENDATIONS 03/30/2017 Follow up Recommendations Skilled Nursing facility   [facility] IP FREQUENCY AND DURATION 03/30/2017 Speech Therapy Frequency (ACUTE ONLY) min 2x/week Treatment Duration 1 week      CHL IP ORAL PHASE 03/30/2017 Oral Phase Impaired Oral - Pudding Teaspoon -- Oral - Pudding  Cup -- Oral - Honey Teaspoon -- Oral - Honey Cup -- Oral - Nectar Teaspoon -- Oral - Nectar Cup -- Oral - Nectar Straw -- Oral - Thin Teaspoon -- Oral - Thin Cup -- Oral - Thin Straw -- Oral - Puree -- Oral - Mech Soft -- Oral - Regular Weak lingual manipulation;Delayed oral transit Oral - Multi-Consistency -- Oral - Pill -- Oral Phase - Comment --  CHL IP PHARYNGEAL PHASE 03/30/2017 Pharyngeal Phase Impaired Pharyngeal- Pudding Teaspoon -- Pharyngeal -- Pharyngeal- Pudding Cup -- Pharyngeal -- Pharyngeal- Honey Teaspoon -- Pharyngeal -- Pharyngeal- Honey Cup -- Pharyngeal -- Pharyngeal- Nectar Teaspoon -- Pharyngeal -- Pharyngeal- Nectar Cup -- Pharyngeal -- Pharyngeal- Nectar Straw Delayed swallow initiation-vallecula;Penetration/Aspiration during swallow;Reduced airway/laryngeal closure;Pharyngeal residue - valleculae;Reduced tongue base retraction Pharyngeal Material enters airway, remains ABOVE vocal cords and not ejected out Pharyngeal- Thin Teaspoon -- Pharyngeal -- Pharyngeal- Thin Cup Delayed swallow initiation-vallecula;Reduced tongue base retraction;Pharyngeal residue - valleculae Pharyngeal -- Pharyngeal- Thin Straw Delayed swallow initiation-pyriform sinuses;Reduced airway/laryngeal closure;Reduced tongue base retraction;Penetration/Aspiration during swallow;Trace aspiration;Pharyngeal residue - valleculae Pharyngeal Material enters airway, CONTACTS cords and not ejected out;Material enters airway, passes BELOW cords without attempt by patient to eject out (silent aspiration) Pharyngeal- Puree Delayed swallow initiation-vallecula;Pharyngeal residue - valleculae Pharyngeal -- Pharyngeal- Mechanical Soft -- Pharyngeal -- Pharyngeal- Regular Delayed swallow initiation-vallecula;Reduced tongue base retraction;Pharyngeal residue - valleculae Pharyngeal -- Pharyngeal- Multi-consistency -- Pharyngeal -- Pharyngeal- Pill Delayed swallow initiation-vallecula;Pharyngeal residue - valleculae Pharyngeal --  Pharyngeal Comment --  CHL IP CERVICAL ESOPHAGEAL PHASE 03/30/2017 Cervical Esophageal Phase WFL Pudding Teaspoon -- Pudding Cup -- Honey Teaspoon -- Honey Cup -- Nectar Teaspoon -- Nectar Cup -- Nectar Straw -- Thin Teaspoon -- Thin Cup -- Thin Straw -- Puree -- Mechanical Soft -- Regular -- Multi-consistency -- Pill -- Cervical Esophageal Comment -- Deneise Lever, MS, CCC-SLP Speech-Language Pathologist [phone] No flowsheet data found. Aliene Altes 03/30/2017, 3:44 PM              Assessment/Plan 1. Chronic combined systolic and diastolic congestive heart failure Stable.Last estimated LV ejection fraction was in the   range of 30% to 35% ( 11/29/2016). Exam findings negative for cough,shortness of breath,wheezing or rales except trace edema to lower extremities.continue on Furosemide 20 mg tablet daily,Coreg 3.125 mg tablet twice daily and  lisinopril 5 mg tablet daily.continue to monitor weight.    2. Type 2 diabetes mellitus with chronic kidney disease, without long-term current use of insulin Lab Results  Component Value Date   HGBA1C 6.5 (H) 03/29/2017   CBG log reviewed readings ranging in the 110's-160's.continue on metformin 500 mg tablet daily.On ACE inhibitor and Statin.Annual Foot and eye examine due 09/28/2017.continue to monitor CBG and adjust metformin as indicated if CBG remains stable.   3. Moderate protein-calorie malnutrition Has had progressive weight loss: Wt 182.9 lbs  ( 02/14/2017) Wt 180.4 lbs ( 03/16/2017) Wt 171.9  Lbs ( 04/16/2016). He continues to follow up with Facility Registered Dietician. Diet recently changed to mechanical soft and patient assisted with feeding.Follows up also with speech therapy for dysphagia. Continue to monitor. Check CBC,CMP 04/18/2016  4. Depression with anxiety Stable.Continue on Wellbutrin SR 100 mg tablet daily.continue to monitor for mood changes.   5. Alzheimer's dementia without behavioral disturbance Facility Nurse reports no new  behavioral issues.Continue to assist with ADL's.Fall and safety precautions.  6. Oropharyngeal dysphagia Wt loss noted.Diet changed to mechanical soft.continue to follow up with Speech Therapy.Aspiration precautions.   7.Weight loss   Wt 182.9 lbs  ( 02/14/2017) Wt 180.4 lbs ( 03/16/2017) Wt 171.9  Lbs ( 04/16/2016).possible due to diuretic though diet changed to mechanical soft with assistance will see some improvement. Check CBC,CMP on next lab day to monitor.   Family/ staff Communication: Reviewed plan of care with patient and facility Nurse supervisor   Labs/tests ordered: Check CBC,CMP 04/18/2017  Sandrea Hughs, NP

## 2017-04-16 NOTE — Telephone Encounter (Signed)
Left second message for [facility] to call back.

## 2017-04-18 LAB — CBC
HCT: 35.3
HGB: 12.1
WBC: 6.5
platelet count: 131

## 2017-04-18 LAB — BASIC METABOLIC PANEL
CREATININE: 0.9 (ref 0.6–1.3)
Glucose: 117
POTASSIUM: 3.9 (ref 3.4–5.3)
Sodium: 140 (ref 137–147)

## 2017-04-18 LAB — COMPLETE METABOLIC PANEL WITH GFR
ALT: 14
AST: 14
Albumin: 3.4
Alkaline Phosphatase: 84
BILIRUBIN TOTAL: 0.4
BUN: 22 — AB (ref 4–21)
CALCIUM: 8.8
Creat: 0.91
GLUCOSE: 117
POTASSIUM: 3.9
Sodium: 140
Total Protein: 5.8 g/dL

## 2017-04-18 LAB — CBC AND DIFFERENTIAL
HCT: 35 — AB (ref 41–53)
Hemoglobin: 12.1 — AB (ref 13.5–17.5)
PLATELETS: 131 — AB (ref 150–399)
WBC: 6.5

## 2017-04-18 LAB — HEPATIC FUNCTION PANEL
ALT: 14 (ref 10–40)
AST: 14 (ref 14–40)
Alkaline Phosphatase: 84 (ref 25–125)
Bilirubin, Total: 0.4

## 2017-04-19 ENCOUNTER — Encounter: Payer: Self-pay | Admitting: *Deleted

## 2017-04-26 NOTE — Telephone Encounter (Signed)
A waiting for [name] to call back. Will let [facility] know that patient is to continue daily weights when they call back.

## 2017-05-13 ENCOUNTER — Non-Acute Institutional Stay (SKILLED_NURSING_FACILITY): Payer: Medicare Other | Admitting: Internal Medicine

## 2017-05-13 ENCOUNTER — Encounter: Payer: Self-pay | Admitting: Internal Medicine

## 2017-05-13 DIAGNOSIS — N3281 Overactive bladder: Secondary | ICD-10-CM | POA: Diagnosis not present

## 2017-05-13 DIAGNOSIS — I5042 Chronic combined systolic (congestive) and diastolic (congestive) heart failure: Secondary | ICD-10-CM | POA: Diagnosis not present

## 2017-05-13 DIAGNOSIS — E785 Hyperlipidemia, unspecified: Secondary | ICD-10-CM

## 2017-05-13 DIAGNOSIS — E1122 Type 2 diabetes mellitus with diabetic chronic kidney disease: Secondary | ICD-10-CM | POA: Diagnosis not present

## 2017-05-13 NOTE — Progress Notes (Signed)
Location:  [place] Room Number: 49 Place of Service:  SNF [phone]) Provider:  [name] MD  [name], MD  Patient Care Team: [name], MD as PCP - General (Internal Medicine) [name], [name], NP as Nurse Practitioner (Family Medicine)  Extended Emergency Contact Information Primary Emergency Contact: [name] Address: [place], [place] Phone: [phone] Mobile Phone: [phone] Relation: Spouse Secondary Emergency Contact: [name] [place] of [place] Phone: [phone] Work Phone: [phone] Relation: Daughter  Code Status:  Full code  Goals of care: Advanced Directive information Advanced Directives 05/13/2017  Does Patient Have a Medical Advance Directive? Yes  Type of Paramedic of [place];Living will  Does patient want to make changes to medical advance directive? No - Patient declined  Copy of [place] in Chart? Yes  Would patient like information on creating a medical advance directive? -  Pre-existing out of facility DNR order (yellow form or pink MOST form) -     Chief Complaint  Patient presents with  . Medical Management of Chronic Issues    Routine Visit     HPI:  Pt is a 84 y.o. male seen today for medical management of chronic diseases. He has been at his baseline. He has dementia but is able to express his needs. Per nursing and CNA no new concerns. He feeds himself. He gets around with his walker and will need gait belt for transfers.    Past Medical History:  Diagnosis Date  . Abnormality of gait   . Backache, unspecified   . CHF (congestive heart failure) ([place])   . Colon polyps    adenomatous  . Dementia 11/12/2012  . Depression   . Diabetes mellitus without complication ([place])   . Diverticulosis   . Essential and other specified forms of tremor   . Hemorrhoids   . History of bilateral  hip replacements 11/12/2012  . Memory loss   . 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 Pain in joint, pelvic region and thigh   . Skin cancer of scalp   . Spinal stenosis, lumbar region, without neurogenic claudication   . Unspecified hereditary and idiopathic peripheral neuropathy    Past Surgical History:  Procedure Laterality Date  . CATARACT EXTRACTION, BILATERAL  2012  . JOINT REPLACEMENT    . RETINAL LASER PROCEDURE  2012   retinal wrinkle  . SKIN CANCER EXCISION    . TOTAL HIP ARTHROPLASTY Left 2000  . TOTAL HIP ARTHROPLASTY (aka REPLACEMENT) Right 2011    Allergies  Allergen Reactions  . Axona [Bacid]     Unknown per MAR  . Gabapentin     Hallucinations    Outpatient Encounter Medications as of 05/13/2017  Medication Sig  . acetaminophen (TYLENOL) 500 MG tablet Take 500 mg by mouth every 8 (eight) hours as needed (pain).   Marland Kitchen buPROPion (WELLBUTRIN SR) 100 MG 12 hr tablet Take 100 mg by mouth daily.  . carvedilol (COREG) 3.125 MG tablet Take 1 tablet (3.125 mg total) by mouth 2 (two) times daily with a meal.  . Cholecalciferol (VITAMIN D) 1000 UNITS capsule Take 1,000 Units by mouth daily.    Marland Kitchen donepezil (ARICEPT) 23 MG TABS tablet Take 1 tablet (23 mg total) by mouth daily.  . dorzolamide-timolol (COSOPT) 22.3-6.8 MG/ML ophthalmic solution Place 1 drop into the left eye 2 (  two) times daily.   . Emollient (CERAVE) CREA Apply 1 application topically daily as needed. Apply to arms, legs and trunk   . feeding supplement (BOOST / RESOURCE BREEZE) LIQD Take 1 Container by mouth 2 (two) times daily.  . Flaxseed, Linseed, (FLAXSEED OIL) 1000 MG CAPS Take 1,000 mg by mouth daily.    . folic acid (FOLVITE) 973 MCG tablet Take 800 mcg by mouth every evening.   . furosemide (LASIX) 20 MG tablet Take 1 tablet (20 mg total) by mouth daily.  . hydrocortisone (PROCTOSOL HC) 2.5 % rectal cream Place 1 application rectally at bedtime as needed for  hemorrhoids or anal itching.  Marland Kitchen lisinopril (PRINIVIL,ZESTRIL) 5 MG tablet Take 1 tablet (5 mg total) by mouth daily.  Marland Kitchen loperamide (IMODIUM A-D) 2 MG tablet Take 2 mg by mouth 4 (four) times daily as needed for diarrhea or loose stools.   . memantine (NAMENDA XR) 28 MG CP24 24 hr capsule Take 1 capsule (28 mg total) by mouth daily.  . metFORMIN (GLUCOPHAGE) 500 MG tablet Take 500 mg by mouth daily.   . mirabegron ER (MYRBETRIQ) 50 MG TB24 tablet Take 50 mg by mouth daily.  . Multiple Vitamin (MULTIVITAMIN) tablet Take 1 tablet by mouth daily.    Marland Kitchen nystatin (NYSTATIN) powder Apply topically 2 (two) times daily as needed. Apply to redness and scrotal and sacral area  . Probiotic Product (ALIGN) 4 MG CAPS Take 4 mg by mouth daily.  . simvastatin (ZOCOR) 5 MG tablet Take 5 mg by mouth daily.   Marland Kitchen ZINC OXIDE EX Apply 1 application topically 3 (three) times daily as needed.    No facility-administered encounter medications on file as of 05/13/2017.     Review of Systems  Constitutional: Negative for appetite change, chills and fever.  HENT: Negative for congestion, mouth sores, nosebleeds and trouble swallowing.   Eyes: Positive for visual disturbance.       Has corrective glasses  Respiratory: Negative for cough and shortness of breath.   Cardiovascular: Negative for chest pain, palpitations and leg swelling.  Gastrointestinal: Negative for abdominal pain, constipation, diarrhea, nausea and vomiting.  Genitourinary: Negative for dysuria and flank pain.  Musculoskeletal: Positive for gait problem. Negative for back pain.  Skin: Negative for rash and wound.  Neurological: Negative for dizziness and headaches.  Psychiatric/Behavioral: Positive for confusion.    Immunization History  Administered Date(s) Administered  . Influenza-Unspecified 01/10/2017  . PPD Test 06/24/2016   Pertinent  Health Maintenance Due  Topic Date Due  . FOOT EXAM  09/28/2017 (Originally 06/06/1940)  . OPHTHALMOLOGY  EXAM  09/28/2017 (Originally 06/06/1940)  . HEMOGLOBIN A1C  09/27/2017  . INFLUENZA VACCINE  Completed  . PNA vac Low Risk Adult  Completed   Fall Risk  12/07/2016 01/19/2016 07/21/2015  Falls in the past year? No Yes Yes  Number falls in past yr: - 1 1  Injury with Fall? - No No  Risk for fall due to : - Impaired balance/gait -  Follow up - Falls prevention discussed Falls prevention discussed   Functional Status Survey:    Vitals:   05/13/17 1056  BP: 131/84  Pulse: 84  Resp: 19  Temp: 98 F (36.7 C)  TempSrc: Oral  SpO2: 93%  Weight: 174 lb 14.4 oz (79.3 kg)  Height: 5\' 11"  (1.803 m)   Body mass index is 24.39 kg/m.   Wt Readings from Last 3 Encounters:  05/13/17 174 lb 14.4 oz (79.3 kg)  04/16/17 171  lb 14.4 oz (78 kg)  04/03/17 176 lb 1.6 oz (79.9 kg)   Physical Exam  Constitutional: He appears well-developed and well-nourished.  HENT:  Head: Normocephalic and atraumatic.  Right Ear: External ear normal.  Left Ear: External ear normal.  Nose: Nose normal.  Mouth/Throat: Oropharynx is clear and moist. No oropharyngeal exudate.  Eyes: Conjunctivae and EOM are normal. Pupils are equal, round, and reactive to light. Right eye exhibits no discharge. Left eye exhibits no discharge.  Neck: Normal range of motion. Neck supple.  Cardiovascular: Normal rate and regular rhythm.  Pulmonary/Chest: Effort normal and breath sounds normal. No respiratory distress. He has no wheezes. He has no rales.  Abdominal: Soft. Bowel sounds are normal. There is no tenderness. There is no guarding.  Musculoskeletal: He exhibits deformity. He exhibits no edema or tenderness.  Able to move all 4 extremities, uses walker to ambulate, limited ROM with shoulders  Lymphadenopathy:    He has no cervical adenopathy.  Neurological: He is alert.  Oriented to person only  Skin: Skin is warm and dry. No rash noted. He is not diaphoretic.  Psychiatric: He has a normal mood and affect.    Labs  reviewed: Recent Labs    11/28/16 2315  03/29/17 1240 03/30/17 0337 03/31/17 0602 04/18/17  NA  --    < > 136 139 141 140  140  K  --    < > 3.6 3.3* 3.6 3.9  3.9  CL  --    < > 103 110 112*  --   CO2  --    < > 25 22 22   --   GLUCOSE  --    < > 173* 127* 109*  --   BUN  --    < > 10 11 14  22*  CREATININE 0.94   < > 1.09 1.00 1.05 0.9  0.91  CALCIUM  --    < > 9.0 8.1* 8.3* 8.8  MG 1.8  --   --   --   --   --    < > = values in this interval not displayed.   Recent Labs    03/29/17 1240 03/30/17 0337 04/18/17  AST 25 20 14  14   ALT 25 17 14  14   ALKPHOS 113 75 84  84  BILITOT 0.6 0.8 0.4  PROT 6.8 5.3* 5.8  ALBUMIN 3.8 2.8* 3.4   Recent Labs    06/22/16 1057  11/28/16 1129  03/29/17 1240 03/30/17 0337 03/31/17 0602 04/18/17  WBC 18.7*   < > 17.1*   < > 13.9* 6.4 6.7 6.5  6.5  NEUTROABS 16.0*  --  14.9*  --  12.6*  --   --   --   HGB 15.0   < > 14.0   < > 14.3 11.4* 11.0* 12.1*  12.1  HCT 43.9   < > 40.4   < > 43.0 34.3* 33.7* 35*  35.3  MCV 96.9   < > 94.4   < > 96.2 98.3 98.5  --   PLT 130*   < > 145*   < > 129* 100* 90* 131*   < > = values in this interval not displayed.   Lab Results  Component Value Date   TSH 2.40 06/26/2016   Lab Results  Component Value Date   HGBA1C 6.5 (H) 03/29/2017   Lab Results  Component Value Date   CHOL 103 01/31/2017   HDL 49 01/31/2017   LDLCALC 41  01/31/2017   TRIG 47 01/31/2017    Significant Diagnostic Results in last 30 days:  No results found.  Assessment/Plan  Type 2 diabetes with renal impairment Reviewed a1c of 6.5. Blood sugar 102-141. No hypoglycemic episodes. Continue metformin 500 mg daily with statin. Continue ace inhibitor for renal protection. Reviewed BMP.   Hyperlipidemia Lipid Panel     Component Value Date/Time   CHOL 103 01/31/2017   CHOL 135 12/27/2016   TRIG 47 01/31/2017   TRIG 72 12/27/2016   HDL 49 01/31/2017   LDLCALC 41 01/31/2017   LDLCALC 72 12/27/2016   LDL at goal.  Continue simvastatin 5 mg daily and monitor. LFT stable.  OAB With urinary incontinence. Continue perineal care. Continue mirabegron 50 mg daily and monitor  CHF Continue coreg 3.125 mg bid with lisinopril 5 mg daily and furosemide 20 mg daily. Weight reviewed overall stable between 172-176 lbs. Check weight 3 days a week from daily for now.      Family/ staff Communication: reviewed care plan with patient and charge nurse.    Labs/tests ordered:  none   [name], MD Internal Medicine [facility] [address] [place], [place] [address] Cell Phone (Monday-Friday 8 am - 5 pm): [phone] On Call: [phone] and follow prompts after 5 pm and on weekends Office Phone: [phone] Office Fax: [phone]

## 2017-05-14 ENCOUNTER — Ambulatory Visit: Payer: Medicare Other | Admitting: Podiatry

## 2017-05-14 ENCOUNTER — Encounter: Payer: Self-pay | Admitting: Podiatry

## 2017-05-14 DIAGNOSIS — B351 Tinea unguium: Secondary | ICD-10-CM | POA: Diagnosis not present

## 2017-05-14 DIAGNOSIS — I878 Other specified disorders of veins: Secondary | ICD-10-CM

## 2017-05-14 DIAGNOSIS — E1159 Type 2 diabetes mellitus with other circulatory complications: Secondary | ICD-10-CM

## 2017-05-14 DIAGNOSIS — I739 Peripheral vascular disease, unspecified: Secondary | ICD-10-CM

## 2017-05-14 DIAGNOSIS — M79676 Pain in unspecified toe(s): Secondary | ICD-10-CM

## 2017-05-14 NOTE — Progress Notes (Signed)
Patient ID: Jose [name], male   DOB: [birth year], 84 y.o.   MRN: [id number] Complaint:  Visit Type: Patient returns to my office for continued preventative foot care services. Complaint: Patient states" my nails have grown long and thick and become painful to walk and wear shoes" . He presents for preventative foot care services.  Podiatric Exam: Vascular: dorsalis pedis and posterior tibial pulses are not  palpable bilateral. Capillary return is diminished. Temperature gradient is diminished. Skin turgor  diminished .  Purplish discoloration feet  B/L Sensorium: Normal Semmes Weinstein monofilament test. Normal tactile sensation bilaterally. Nail Exam: Pt has thick disfigured discolored nails with subungual debris noted bilateral entire nail hallux through fifth toenails Ulcer Exam: There is no evidence of ulcer or pre-ulcerative changes or infection. Orthopedic Exam: Muscle tone and strength are WNL. No limitations in general ROM. No crepitus or effusions noted. Foot type and digits show no abnormalities. Bony prominences are unremarkable. Skin: No Porokeratosis. No infection or ulcers  Diagnosis:  Tinea unguium, Pain in right toe, pain in left toes, PVD  Treatment & Plan Procedures and Treatment: Consent by patient was obtained for treatment procedures. The patient understood the discussion of treatment and procedures well. All questions were answered thoroughly reviewed. Debridement of mycotic and hypertrophic toenails, 1 through 5 bilateral and clearing of subungual debris. No ulceration, no infection noted.  Return Visit-Office Procedure: Patient instructed to return to the office for a follow up visit 3 months for continued evaluation and treatment.  Gardiner Barefoot DPM

## 2017-05-20 ENCOUNTER — Encounter: Payer: Self-pay | Admitting: Internal Medicine

## 2017-05-20 ENCOUNTER — Non-Acute Institutional Stay (SKILLED_NURSING_FACILITY): Payer: Medicare Other | Admitting: Internal Medicine

## 2017-05-20 DIAGNOSIS — F02818 Dementia in other diseases classified elsewhere, unspecified severity, with other behavioral disturbance: Secondary | ICD-10-CM

## 2017-05-20 DIAGNOSIS — F0281 Dementia in other diseases classified elsewhere with behavioral disturbance: Secondary | ICD-10-CM

## 2017-05-20 DIAGNOSIS — I5042 Chronic combined systolic (congestive) and diastolic (congestive) heart failure: Secondary | ICD-10-CM

## 2017-05-20 DIAGNOSIS — G308 Other Alzheimer's disease: Secondary | ICD-10-CM | POA: Diagnosis not present

## 2017-05-20 DIAGNOSIS — Z7189 Other specified counseling: Secondary | ICD-10-CM

## 2017-05-20 NOTE — Progress Notes (Signed)
Location:  [facility] Room Number: 36 Place of Service:  SNF [phone]) Provider:  [name] MD  [name], MD  Patient Care Team: [name], MD as PCP - General (Internal Medicine) [name], [name], NP as Nurse Practitioner (Family Medicine)  Extended Emergency Contact Information Primary Emergency Contact: [name] Address: [place], [place] Phone: [phone] Mobile Phone: [phone] Relation: Spouse Secondary Emergency Contact: [name] [place] of [place] Phone: [phone] Work Phone: [phone] Relation: Daughter  Code Status:  Full code Goals of care: Advanced Directive information Advanced Directives 05/20/2017  Does Patient Have a Medical Advance Directive? Yes  Type of Advance Directive -  Does patient want to make changes to medical advance directive? Yes (MAU/Ambulatory/Procedural Areas - Information given)  Copy of [place] in Chart? No - copy requested  Would patient like information on creating a medical advance directive? -  Pre-existing out of facility DNR order (yellow form or pink MOST form) -     Chief Complaint  Patient presents with  . Acute Visit    Advanced care plan meeting    HPI:  Pt is a 84 y.o. male seen today with his wife to review goals of care. Patient has advanced dementia and was recently in hospital with aspiration pneumonia and dysphagia. He has been doing fair since his return from the hospital. With advanced dementia he remains a high risk for hospitalization with aspiration. He has medical history of CHF and DM among others. He denies any concern this visit. His HPI and ROS are limited with dementia.    Past Medical History:  Diagnosis Date  . Abnormality of gait   . Backache, unspecified   . CHF (congestive heart failure) ([place])   . Colon polyps    adenomatous  . Dementia 11/12/2012  . Depression     . Diabetes mellitus without complication ([place])   . Diverticulosis   . Essential and other specified forms of tremor   . Hemorrhoids   . History of bilateral hip replacements 11/12/2012  . Memory loss   . 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 Pain in joint, pelvic region and thigh   . Skin cancer of scalp   . Spinal stenosis, lumbar region, without neurogenic claudication   . Unspecified hereditary and idiopathic peripheral neuropathy    Past Surgical History:  Procedure Laterality Date  . CATARACT EXTRACTION, BILATERAL  2012  . JOINT REPLACEMENT    . RETINAL LASER PROCEDURE  2012   retinal wrinkle  . SKIN CANCER EXCISION    . TOTAL HIP ARTHROPLASTY Left 2000  . TOTAL HIP ARTHROPLASTY (aka REPLACEMENT) Right 2011    Allergies  Allergen Reactions  . Axona [Bacid]     Unknown per MAR  . Gabapentin     Hallucinations    Outpatient Encounter Medications as of 05/20/2017  Medication Sig  . acetaminophen (TYLENOL) 500 MG tablet Take 500 mg by mouth every 8 (eight) hours as needed (pain).   Marland Kitchen buPROPion (WELLBUTRIN SR) 100 MG 12 hr tablet Take 100 mg by mouth daily.  . carvedilol (COREG) 3.125 MG tablet Take 1 tablet (3.125 mg total) by mouth 2 (two) times daily with a meal.  . Cholecalciferol (VITAMIN D) 1000 UNITS capsule Take 1,000 Units by mouth daily.    Marland Kitchen donepezil (ARICEPT) 23 MG TABS tablet Take  1 tablet (23 mg total) by mouth daily.  . dorzolamide-timolol (COSOPT) 22.3-6.8 MG/ML ophthalmic solution Place 1 drop into the left eye 2 (two) times daily.   . Emollient (CERAVE) CREA Apply 1 application topically daily as needed. Apply to arms, legs and trunk   . feeding supplement (BOOST / RESOURCE BREEZE) LIQD Take 1 Container by mouth 2 (two) times daily.  . Flaxseed, Linseed, (FLAXSEED OIL) 1000 MG CAPS Take 1,000 mg by mouth daily.    . folic acid (FOLVITE) 024 MCG tablet Take 800 mcg by mouth every evening.   . furosemide (LASIX) 20 MG  tablet Take 1 tablet (20 mg total) by mouth daily.  . hydrocortisone (PROCTOSOL HC) 2.5 % rectal cream Place 1 application rectally at bedtime as needed for hemorrhoids or anal itching.  Marland Kitchen lisinopril (PRINIVIL,ZESTRIL) 5 MG tablet Take 1 tablet (5 mg total) by mouth daily.  Marland Kitchen loperamide (IMODIUM A-D) 2 MG tablet Take 2 mg by mouth 4 (four) times daily as needed for diarrhea or loose stools.   . memantine (NAMENDA XR) 28 MG CP24 24 hr capsule Take 1 capsule (28 mg total) by mouth daily.  . metFORMIN (GLUCOPHAGE) 500 MG tablet Take 500 mg by mouth daily.   . mirabegron ER (MYRBETRIQ) 50 MG TB24 tablet Take 50 mg by mouth daily.  . Multiple Vitamin (MULTIVITAMIN) tablet Take 1 tablet by mouth daily.    Marland Kitchen nystatin (NYSTATIN) powder Apply topically 2 (two) times daily as needed. Apply to redness and scrotal and sacral area  . Probiotic Product (ALIGN) 4 MG CAPS Take 4 mg by mouth daily.  . simvastatin (ZOCOR) 5 MG tablet Take 5 mg by mouth daily.   Marland Kitchen ZINC OXIDE EX Apply 1 application topically 3 (three) times daily as needed.    No facility-administered encounter medications on file as of 05/20/2017.     Review of Systems  Unable to perform ROS: Dementia  Constitutional: Positive for fatigue. Negative for fever.  HENT: Positive for hearing loss and rhinorrhea. Negative for congestion and ear pain.   Eyes: Positive for visual disturbance.  Respiratory: Negative for cough and shortness of breath.   Cardiovascular: Negative for chest pain.  Gastrointestinal: Negative for abdominal pain.  Musculoskeletal: Positive for gait problem.  Neurological: Negative for dizziness and headaches.  Psychiatric/Behavioral: Positive for behavioral problems and confusion.    Immunization History  Administered Date(s) Administered  . Influenza-Unspecified 01/10/2017  . PPD Test 06/24/2016   Pertinent  Health Maintenance Due  Topic Date Due  . FOOT EXAM  09/28/2017 (Originally 06/06/1940)  . OPHTHALMOLOGY EXAM   09/28/2017 (Originally 06/06/1940)  . HEMOGLOBIN A1C  09/27/2017  . INFLUENZA VACCINE  Completed  . PNA vac Low Risk Adult  Completed   Fall Risk  12/07/2016 01/19/2016 07/21/2015  Falls in the past year? No Yes Yes  Number falls in past yr: - 1 1  Injury with Fall? - No No  Risk for fall due to : - Impaired balance/gait -  Follow up - Falls prevention discussed Falls prevention discussed   Functional Status Survey:    Vitals:   05/20/17 1051  BP: 106/60  Pulse: 95  Resp: 20  Temp: 97.8 F (36.6 C)  TempSrc: Oral  SpO2: 93%  Weight: 176 lb 1.6 oz (79.9 kg)  Height: 5\' 11"  (1.803 m)   Body mass index is 24.56 kg/m. Physical Exam  Constitutional: He appears well-developed and well-nourished. No distress.  HENT:  Head: Normocephalic and atraumatic.  Mouth/Throat: Oropharynx  is clear and moist.  Eyes: Conjunctivae are normal. Pupils are equal, round, and reactive to light.  Neck: Neck supple.  Cardiovascular: Normal rate and regular rhythm.  Pulmonary/Chest: Effort normal and breath sounds normal.  Abdominal: Soft. Bowel sounds are normal.  Musculoskeletal: He exhibits edema.  Lymphadenopathy:    He has no cervical adenopathy.  Neurological: He is alert.  Oriented to person only  Skin: Skin is warm and dry. He is not diaphoretic.  Psychiatric:  Pleasantly confused    Labs reviewed: Recent Labs    11/28/16 2315  03/29/17 1240 03/30/17 0337 03/31/17 0602 04/18/17  NA  --    < > 136 139 141 140  140  K  --    < > 3.6 3.3* 3.6 3.9  3.9  CL  --    < > 103 110 112*  --   CO2  --    < > 25 22 22   --   GLUCOSE  --    < > 173* 127* 109*  --   BUN  --    < > 10 11 14  22*  CREATININE 0.94   < > 1.09 1.00 1.05 0.9  0.91  CALCIUM  --    < > 9.0 8.1* 8.3* 8.8  MG 1.8  --   --   --   --   --    < > = values in this interval not displayed.   Recent Labs    03/29/17 1240 03/30/17 0337 04/18/17  AST 25 20 14  14   ALT 25 17 14  14   ALKPHOS 113 75 84  84  BILITOT  0.6 0.8 0.4  PROT 6.8 5.3* 5.8  ALBUMIN 3.8 2.8* 3.4   Recent Labs    06/22/16 1057  11/28/16 1129  03/29/17 1240 03/30/17 0337 03/31/17 0602 04/18/17  WBC 18.7*   < > 17.1*   < > 13.9* 6.4 6.7 6.5  6.5  NEUTROABS 16.0*  --  14.9*  --  12.6*  --   --   --   HGB 15.0   < > 14.0   < > 14.3 11.4* 11.0* 12.1*  12.1  HCT 43.9   < > 40.4   < > 43.0 34.3* 33.7* 35*  35.3  MCV 96.9   < > 94.4   < > 96.2 98.3 98.5  --   PLT 130*   < > 145*   < > 129* 100* 90* 131*   < > = values in this interval not displayed.   Lab Results  Component Value Date   TSH 2.40 06/26/2016   Lab Results  Component Value Date   HGBA1C 6.5 (H) 03/29/2017   Lab Results  Component Value Date   CHOL 103 01/31/2017   HDL 49 01/31/2017   LDLCALC 41 01/31/2017   TRIG 47 01/31/2017    Significant Diagnostic Results in last 30 days:  No results found.  Assessment/Plan  Goals of care discussion Goals of care discussion today with patient's wife and social worker present. Patient has a living will in chart. No HCPOA paperwork. Reviewed MOST form today. Meeting between 11:05 am-11:35 am. In absence of pulse or breathing, wife would like for patient to remain full code. She understands that he has memory impairment and other medical issues but she would like for him to be given a chance to come back to life if possible and does not want to feel guilty. I tried to explain about his quality  of life post attempt for CPR with his advanced dementia, other medical co-morbidities and complications like fracture of ribs, chest pain and discomfort post CPR. She would like for him to be full code. She would like for him to be sent to the hospital and all interventions required be done to keep him alive. If she would like for changes, that is to happen on a day to day basis. She would like iv fluids and antibiotic if indicated. She would like a feeding tube if indicated for nutritional support. She would like to review this form  with her daughter as well. She is to take this form home and review with her family. She would like to list her daughter as Chauncey Reading in case something happens to herself in order to make decisions. I have advised her to have formal paperwork be filled out with her attorney. I have advised her to contact [facility] social worker with any questions and if needed, we will sit down and go over her questions and concerns. She voices understanding this.   Dementia with behavioral disturbance Supportive care, continue donepezil, memantine and bupropion.  CHF Stable, gained some weight but clinically euvolemic. continue b blocker, ACEI and lasix Wt Readings from Last 3 Encounters:  05/20/17 176 lb 1.6 oz (79.9 kg)  05/13/17 174 lb 14.4 oz (79.3 kg)  04/16/17 171 lb 14.4 oz (78 kg)     Family/ staff Communication: reviewed care plan with patient/s wife, Education officer, museum and Camera operator.    Labs/tests ordered:  none   [name], MD Internal Medicine [facility] [address] [place], [place] [address] Cell Phone (Monday-Friday 8 am - 5 pm): [phone] On Call: [phone] and follow prompts after 5 pm and on weekends Office Phone: [phone] Office Fax: [phone]

## 2017-06-03 NOTE — Progress Notes (Signed)
CARDIOLOGY OFFICE NOTE  Date:  06/05/2017    Jose [name] Date of Birth: 12-17-1930 Medical Record #[id number]  PCP:  [name], MD  Cardiologist:  Gillian Shields   No chief complaint on file.   History of Present Illness:   84 y.o.  history of spinal stenosis, arthritis, dementia and DM.  Hospitalized August 2018 with CHF Ech oEF 30-35%  Mild troponin elevation D/c with low dose ACE/Beta blocker as BP soft. Issues with balance and aspiration   Echo 11/02/16 reviewed EF 01-02% grade 2 diastolic Moderate LAE no PA HTN no significant valve disease  Wife indicates biggest issue is balance, and nutrition He is not eating well. Talks gibberish a lot now as dementia has advanced  LE edema improved and no dyspnea Meds dispensed by [facility] so compliant   Past Medical History:  Diagnosis Date  . Abnormality of gait   . Backache, unspecified   . CHF (congestive heart failure) ([place])   . Colon polyps    adenomatous  . Dementia 11/12/2012  . Depression   . Diabetes mellitus without complication ([place])   . Diverticulosis   . Essential and other specified forms of tremor   . Hemorrhoids   . History of bilateral hip replacements 11/12/2012  . Memory loss   . 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 Pain in joint, pelvic region and thigh   . Skin cancer of scalp   . Spinal stenosis, lumbar region, without neurogenic claudication   . Unspecified hereditary and idiopathic peripheral neuropathy     Past Surgical History:  Procedure Laterality Date  . CATARACT EXTRACTION, BILATERAL  2012  . JOINT REPLACEMENT    . RETINAL LASER PROCEDURE  2012   retinal wrinkle  . SKIN CANCER EXCISION    . TOTAL HIP ARTHROPLASTY Left 2000  . TOTAL HIP ARTHROPLASTY (aka REPLACEMENT) Right 2011     Medications: Current Meds  Medication Sig  . acetaminophen (TYLENOL) 500 MG tablet Take 500 mg by mouth every 8 (eight) hours as needed (pain).   Marland Kitchen  buPROPion (WELLBUTRIN SR) 100 MG 12 hr tablet Take 100 mg by mouth daily.  . carvedilol (COREG) 3.125 MG tablet Take 1 tablet (3.125 mg total) by mouth 2 (two) times daily with a meal.  . Cholecalciferol (VITAMIN D) 1000 UNITS capsule Take 1,000 Units by mouth daily.    Marland Kitchen donepezil (ARICEPT) 23 MG TABS tablet Take 1 tablet (23 mg total) by mouth daily.  . dorzolamide-timolol (COSOPT) 22.3-6.8 MG/ML ophthalmic solution Place 1 drop into the left eye 2 (two) times daily.   . Emollient (CERAVE) CREA Apply 1 application topically daily as needed. Apply to arms, legs and trunk   . feeding supplement (BOOST / RESOURCE BREEZE) LIQD Take 1 Container by mouth 2 (two) times daily.  . Flaxseed, Linseed, (FLAXSEED OIL) 1000 MG CAPS Take 1,000 mg by mouth daily.    . folic acid (FOLVITE) 725 MCG tablet Take 800 mcg by mouth every evening.   . furosemide (LASIX) 20 MG tablet Take 1 tablet (20 mg total) by mouth daily.  . hydrocortisone (PROCTOSOL HC) 2.5 % rectal cream Place 1 application rectally at bedtime as needed for hemorrhoids or anal itching.  Marland Kitchen lisinopril (PRINIVIL,ZESTRIL) 5 MG tablet Take 1 tablet (5 mg total) by mouth daily.  Marland Kitchen loperamide (IMODIUM A-D) 2 MG tablet Take 2 mg by mouth 4 (four) times daily as needed for diarrhea or loose  stools.   . memantine (NAMENDA XR) 28 MG CP24 24 hr capsule Take 1 capsule (28 mg total) by mouth daily.  . metFORMIN (GLUCOPHAGE) 500 MG tablet Take 500 mg by mouth daily.   . mirabegron ER (MYRBETRIQ) 50 MG TB24 tablet Take 50 mg by mouth daily.  . Multiple Vitamin (MULTIVITAMIN) tablet Take 1 tablet by mouth daily.    Marland Kitchen nystatin (NYSTATIN) powder Apply topically 2 (two) times daily as needed. Apply to redness and scrotal and sacral area  . Probiotic Product (ALIGN) 4 MG CAPS Take 4 mg by mouth daily.  . simvastatin (ZOCOR) 5 MG tablet Take 5 mg by mouth daily.   Marland Kitchen ZINC OXIDE EX Apply 1 application topically 3 (three) times daily as needed.       Allergies: Allergies  Allergen Reactions  . Axona [Bacid]     Unknown per MAR  . Gabapentin     Hallucinations    Social History: The patient  reports that he has quit smoking. His smoking use included cigarettes. he has never used smokeless tobacco. He reports that he drinks about 8.4 oz of alcohol per week. He reports that he does not use drugs.   Family History: The patient's family history includes Heart failure in his mother.   Review of Systems: Please see the history of present illness.   Otherwise, the review of systems is positive for none.   All other systems are reviewed and negative.   Physical Exam: VS:  BP 128/62   Pulse 69   Ht 6' (1.829 m)   SpO2 97%   BMI 23.88 kg/m  .  BMI Body mass index is 23.88 kg/m.  Wt Readings from Last 3 Encounters:  05/20/17 176 lb 1.6 oz (79.9 kg)  05/13/17 174 lb 14.4 oz (79.3 kg)  04/16/17 171 lb 14.4 oz (78 kg)    Affect appropriate Demented elderly male  HEENT: normal Neck supple with no adenopathy JVP normal no bruits no thyromegaly Lungs clear with no wheezing and good diaphragmatic motion Heart:  S1/S2 no murmur, no rub, gallop or click PMI normal Abdomen: benighn, BS positve, no tenderness, no AAA no bruit.  No HSM or HJR Distal pulses intact with no bruits Trace bilateral  edema Neuro non-focal but significant dementia Skin warm and dry No muscular weakness    LABORATORY DATA:  EKG:  EKG is not ordered today.  Lab Results  Component Value Date   WBC 6.5 04/18/2017   WBC 6.5 04/18/2017   HGB 12.1 04/18/2017   HGB 12.1 (A) 04/18/2017   HCT 35.3 04/18/2017   HCT 35 (A) 04/18/2017   PLT 131 (A) 04/18/2017   GLUCOSE 109 (H) 03/31/2017   CHOL 103 01/31/2017   TRIG 47 01/31/2017   HDL 49 01/31/2017   LDLCALC 41 01/31/2017   ALT 14 04/18/2017   ALT 14 04/18/2017   AST 14 04/18/2017   AST 14 04/18/2017   NA 140 04/18/2017   NA 140 04/18/2017   K 3.9 04/18/2017   K 3.9 04/18/2017   CL 112  (H) 03/31/2017   CREATININE 0.91 04/18/2017   CREATININE 0.9 04/18/2017   BUN 22 (A) 04/18/2017   CO2 22 03/31/2017   TSH 2.40 06/26/2016   INR 1.15 03/30/2017   HGBA1C 6.5 (H) 03/29/2017     BNP (last 3 results) Recent Labs    11/28/16 1129  BNP 994.3*    ProBNP (last 3 results) Recent Labs    12/18/16 1503 03/04/17 1520  PROBNP 1,931* 1,249*     Other Studies Reviewed Today:  Echo Study Conclusions 10/2016  - Left ventricle: The cavity size was normal. There was moderate concentric hypertrophy. Systolic function was moderately to severely reduced. The estimated ejection fraction was in the range of 30% to 35%. Diffuse hypokinesis worse in the inferoseptal myocardium. Features are consistent with a pseudonormal left ventricular filling pattern, with concomitant abnormal relaxation and increased filling pressure (grade 2 diastolic dysfunction). Doppler parameters are consistent with high ventricular filling pressure. - Aortic valve: Transvalvular velocity was within the normal range. There was no stenosis. There was mild regurgitation. Valve area (Vmax): 1.72 cm^2. - Mitral valve: Mildly calcified annulus. Transvalvular velocity was within the normal range. There was no evidence for stenosis. There was no regurgitation. - Left atrium: The atrium was moderately dilated. - Right ventricle: The cavity size was normal. Wall thickness was normal. Systolic function was normal. - Right atrium: The atrium was moderately to severely dilated. - Tricuspid valve: There was no regurgitation. - Pulmonary arteries: PA peak pressure: 12 mm Hg (S).   Assessment/Plan:  1. Chronic systolic HF - EF is 30 to 35% - managed medically. BP remains soft - he is on just low dose ACE and beta blocker BP too low for entresto Continue conservative Rx given age and co morbidities  2. Dementia: advanced support at [facility]   3. Aspiration - wife says  improved. Consider f/u swallow study   4. Elevated troponin - no chest pain given age and dementia observe   5. Balance issue - PT/OT    Jenkins Rouge

## 2017-06-04 LAB — LIPID PANEL
CHOLESTEROL: 143
HDL: 41
LDL (calc): 81
Triglycerides: 113

## 2017-06-04 LAB — HEMOGLOBIN A1C: A1C: 6.6

## 2017-06-05 ENCOUNTER — Encounter: Payer: Self-pay | Admitting: Cardiovascular Disease

## 2017-06-05 ENCOUNTER — Ambulatory Visit: Payer: Medicare Other | Admitting: Cardiovascular Disease

## 2017-06-05 VITALS — BP 128/62 | HR 69 | Ht 72.0 in

## 2017-06-05 DIAGNOSIS — I5022 Chronic systolic (congestive) heart failure: Secondary | ICD-10-CM | POA: Diagnosis not present

## 2017-06-05 NOTE — Patient Instructions (Signed)

## 2017-06-06 ENCOUNTER — Encounter: Payer: Self-pay | Admitting: *Deleted

## 2017-06-06 ENCOUNTER — Non-Acute Institutional Stay (SKILLED_NURSING_FACILITY): Payer: Medicare Other | Admitting: Family

## 2017-06-06 ENCOUNTER — Encounter: Payer: Self-pay | Admitting: Family

## 2017-06-06 DIAGNOSIS — E1122 Type 2 diabetes mellitus with diabetic chronic kidney disease: Secondary | ICD-10-CM

## 2017-06-06 DIAGNOSIS — N182 Chronic kidney disease, stage 2 (mild): Secondary | ICD-10-CM

## 2017-06-06 DIAGNOSIS — R609 Edema, unspecified: Secondary | ICD-10-CM

## 2017-06-06 DIAGNOSIS — F339 Major depressive disorder, recurrent, unspecified: Secondary | ICD-10-CM | POA: Diagnosis not present

## 2017-06-06 DIAGNOSIS — E785 Hyperlipidemia, unspecified: Secondary | ICD-10-CM

## 2017-06-06 DIAGNOSIS — I5042 Chronic combined systolic (congestive) and diastolic (congestive) heart failure: Secondary | ICD-10-CM

## 2017-06-06 NOTE — Progress Notes (Signed)
Location:  [facility] Room Number: 34 Place of Service:  SNF (31) Provider: Starlit Raburn FNP-C   [name], MD  Patient Care Team: [name], MD as PCP - General (Internal Medicine) Keiera Strathman, [name], NP as Nurse Practitioner (Family Medicine)  Extended Emergency Contact Information Primary Emergency Contact: [name] Address: [place], [place] Phone: [phone] Mobile Phone: [phone] Relation: Spouse Secondary Emergency Contact: [name] [place] Phone: [phone] Work Phone: [phone] Relation: Daughter  Code Status: Full code  Goals of care: Advanced Directive information Advanced Directives 06/06/2017  Does Patient Have a Medical Advance Directive? Yes  Type of Paramedic of [place];Living will  Does patient want to make changes to medical advance directive? -  Copy of [place] in Chart? Yes  Would patient like information on creating a medical advance directive? -  Pre-existing out of facility DNR order (yellow form or pink MOST form) -     Chief Complaint  Patient presents with  . Medical Management of Chronic Issues    HPI:  Pt is a 84 y.o. male seen today [place] for medical management of chronic diseases.He has a medical history of HTN,CHF,Hyperlipidemia,Type 2 DM,dysphagia,Alzheimer disease among other conditions.He is seen in his room with facility Nurse present at bedside.He denies any acute issues during visit but HPI and ROS limited due to dementia.Patient cooperative and follows instructions during visit but verbally abusive.Facility Nurse reports no new concerns. He has had no recent fall episodes or weight changes. He uses wheelchair with assistance and able to stand with walker.No skin breakdown.He was seen 06/05/2017 by Cardiologist Dr. Jenkins Rouge at [facility] no new orders patient  to follow up with cardiologist in 6 months.    Past Medical History:  Diagnosis Date  . Abnormality of gait   . Backache, unspecified   . CHF (congestive heart failure) ([place])   . Colon polyps    adenomatous  . Dementia 11/12/2012  . Depression   . Diabetes mellitus without complication ([place])   . Diverticulosis   . Essential and other specified forms of tremor   . Hemorrhoids   . History of bilateral hip replacements 11/12/2012  . Memory loss   . 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 Pain in joint, pelvic region and thigh   . Skin cancer of scalp   . Spinal stenosis, lumbar region, without neurogenic claudication   . Unspecified hereditary and idiopathic peripheral neuropathy    Past Surgical History:  Procedure Laterality Date  . CATARACT EXTRACTION, BILATERAL  2012  . JOINT REPLACEMENT    . RETINAL LASER PROCEDURE  2012   retinal wrinkle  . SKIN CANCER EXCISION    . TOTAL HIP ARTHROPLASTY Left 2000  . TOTAL HIP ARTHROPLASTY (aka REPLACEMENT) Right 2011    Allergies  Allergen Reactions  . Axona [Bacid]     Unknown per MAR  . Gabapentin     Hallucinations    Allergies as of 06/06/2017      Reactions   Axona [bacid]    Unknown per [facility]   Gabapentin    Hallucinations      Medication List        Accurate as of 06/06/17  3:08 PM. Always use your most recent med list.          acetaminophen 500  MG tablet Commonly known as:  TYLENOL Take 500 mg by mouth every 8 (eight) hours as needed (pain).   ALIGN 4 MG Caps Take 4 mg by mouth daily.   buPROPion 100 MG 12 hr tablet Commonly known as:  WELLBUTRIN SR Take 100 mg by mouth daily.   carvedilol 3.125 MG tablet Commonly known as:  COREG Take 1 tablet (3.125 mg total) by mouth 2 (two) times daily with a meal.   CERAVE Crea Apply 1 application topically daily as needed. Apply to arms, legs and trunk   donepezil 23 MG Tabs tablet Commonly known as:  ARICEPT Take 1 tablet  (23 mg total) by mouth daily.   dorzolamide-timolol 22.3-6.8 MG/ML ophthalmic solution Commonly known as:  COSOPT Place 1 drop into the left eye 2 (two) times daily.   feeding supplement Liqd Commonly known as:  BOOST / RESOURCE BREEZE Take 1 Container by mouth 2 (two) times daily.   Flaxseed Oil 1000 MG Caps Take 1,000 mg by mouth daily.   folic acid 572 MCG tablet Commonly known as:  FOLVITE Take 800 mcg by mouth every evening.   furosemide 20 MG tablet Commonly known as:  LASIX Take 1 tablet (20 mg total) by mouth daily.   lisinopril 5 MG tablet Commonly known as:  PRINIVIL,ZESTRIL Take 1 tablet (5 mg total) by mouth daily.   loperamide 2 MG tablet Commonly known as:  IMODIUM A-D Take 2 mg by mouth 4 (four) times daily as needed for diarrhea or loose stools.   memantine 28 MG Cp24 24 hr capsule Commonly known as:  NAMENDA XR Take 1 capsule (28 mg total) by mouth daily.   metFORMIN 500 MG tablet Commonly known as:  GLUCOPHAGE Take 500 mg by mouth daily.   multivitamin tablet Take 1 tablet by mouth daily.   MYRBETRIQ 50 MG Tb24 tablet Generic drug:  mirabegron ER Take 50 mg by mouth daily.   PROCTOSOL HC 2.5 % rectal cream Generic drug:  hydrocortisone Place 1 application rectally at bedtime as needed for hemorrhoids or anal itching.   simvastatin 5 MG tablet Commonly known as:  ZOCOR Take 5 mg by mouth daily.   Vitamin D 1000 units capsule Take 1,000 Units by mouth daily.   ZINC OXIDE EX Apply 1 application topically 3 (three) times daily as needed.       Review of Systems  Unable to perform ROS: Dementia (additional information provided by facility Nurse )  Constitutional: Negative for chills, fever and unexpected weight change.  HENT: Negative for congestion, rhinorrhea, sinus pressure, sinus pain, sneezing and sore throat.   Eyes: Positive for visual disturbance. Negative for discharge, redness and itching.       Wears eye glasses   Respiratory:  Negative for cough, chest tightness, shortness of breath and wheezing.   Cardiovascular: Positive for leg swelling. Negative for chest pain and palpitations.  Gastrointestinal: Negative for abdominal distention, abdominal pain, constipation, diarrhea and nausea.  Endocrine: Negative for cold intolerance, heat intolerance, polydipsia, polyphagia and polyuria.  Genitourinary: Negative for dysuria, flank pain and urgency.  Musculoskeletal: Positive for gait problem.  Skin: Negative for color change, pallor, rash and wound.  Neurological: Negative for dizziness, light-headedness and headaches.  Hematological: Does not bruise/bleed easily.  Psychiatric/Behavioral: Positive for behavioral problems and confusion. Negative for agitation and sleep disturbance. The patient is not nervous/anxious.     Immunization History  Administered Date(s) Administered  . Influenza-Unspecified 01/10/2017  . PPD Test 06/24/2016   Pertinent  Health Maintenance Due  Topic Date Due  . FOOT EXAM  09/28/2017 (Originally 06/06/1940)  . OPHTHALMOLOGY EXAM  09/28/2017 (Originally 06/06/1940)  . HEMOGLOBIN A1C  09/27/2017  . INFLUENZA VACCINE  Completed  . PNA vac Low Risk Adult  Completed   Fall Risk  12/07/2016 01/19/2016 07/21/2015  Falls in the past year? No Yes Yes  Number falls in past yr: - 1 1  Injury with Fall? - No No  Risk for fall due to : - Impaired balance/gait -  Follow up - Falls prevention discussed Falls prevention discussed    Vitals:   06/06/17 1113  BP: 98/67  Pulse: 76  Resp: 20  Temp: (!) 97.3 F (36.3 C)  SpO2: 90%  Weight: 176 lb (79.8 kg)  Height: 6' (1.829 m)   Body mass index is 23.87 kg/m. Physical Exam  Constitutional: He appears well-developed and well-nourished.  Elderly in no acute distress   HENT:  Head: Normocephalic.  Right Ear: External ear normal.  Left Ear: External ear normal.  Mouth/Throat: Oropharynx is clear and moist. No oropharyngeal exudate.  Eyes:  Conjunctivae and EOM are normal. Pupils are equal, round, and reactive to light. Right eye exhibits no discharge. Left eye exhibits no discharge. No scleral icterus.  Eye glasses in place   Neck: Normal range of motion. No JVD present. No thyromegaly present.  Cardiovascular: Normal rate, regular rhythm, normal heart sounds and intact distal pulses. Exam reveals no gallop and no friction rub.  No murmur heard. Pulmonary/Chest: Effort normal and breath sounds normal. No respiratory distress. He has no wheezes. He has no rales.  Abdominal: Soft. Bowel sounds are normal. He exhibits no distension. There is no tenderness. There is no rebound and no guarding.  Genitourinary:  Genitourinary Comments: Incontinent   Musculoskeletal: Normal range of motion. He exhibits no tenderness.  Unsteady gait uses wheelchair.Bilateral lower extremities edema   Lymphadenopathy:    He has no cervical adenopathy.  Neurological: He is alert.  Pleasantly confused at baseline.   Skin: Skin is warm and dry. No rash noted. No erythema. No pallor.  Psychiatric: He has a normal mood and affect. His speech is normal. Cognition and memory are impaired.  Verbally abusive during visit     Labs reviewed: Recent Labs    11/28/16 2315  03/29/17 1240 03/30/17 0337 03/31/17 0602 04/18/17  NA  --    < > 136 139 141 140  140  K  --    < > 3.6 3.3* 3.6 3.9  3.9  CL  --    < > 103 110 112*  --   CO2  --    < > 25 22 22   --   GLUCOSE  --    < > 173* 127* 109*  --   BUN  --    < > 10 11 14  22*  CREATININE 0.94   < > 1.09 1.00 1.05 0.9  0.91  CALCIUM  --    < > 9.0 8.1* 8.3* 8.8  MG 1.8  --   --   --   --   --    < > = values in this interval not displayed.   Recent Labs    03/29/17 1240 03/30/17 0337 04/18/17  AST 25 20 14  14   ALT 25 17 14  14   ALKPHOS 113 75 84  84  BILITOT 0.6 0.8 0.4  PROT 6.8 5.3* 5.8  ALBUMIN 3.8 2.8* 3.4   Recent Labs  06/22/16 1057  11/28/16 1129  03/29/17 1240 03/30/17 0337  03/31/17 0602 04/18/17  WBC 18.7*   < > 17.1*   < > 13.9* 6.4 6.7 6.5  6.5  NEUTROABS 16.0*  --  14.9*  --  12.6*  --   --   --   HGB 15.0   < > 14.0   < > 14.3 11.4* 11.0* 12.1*  12.1  HCT 43.9   < > 40.4   < > 43.0 34.3* 33.7* 35*  35.3  MCV 96.9   < > 94.4   < > 96.2 98.3 98.5  --   PLT 130*   < > 145*   < > 129* 100* 90* 131*   < > = values in this interval not displayed.   Lab Results  Component Value Date   TSH 2.40 06/26/2016   Lab Results  Component Value Date   HGBA1C 6.5 (H) 03/29/2017   Lab Results  Component Value Date   CHOL 103 01/31/2017   HDL 49 01/31/2017   LDLCALC 41 01/31/2017   TRIG 47 01/31/2017    Significant Diagnostic Results in last 30 days:  No results found.  Assessment/Plan 1. Type 2 diabetes mellitus with stage 2 chronic kidney disease, without long-term current use of insulin Hgb A1C 6.6 (06/04/2017) CBG log reviewed readings in the 100's-130's.change metformin 500 mg tablet to 1/2 tablet ( 250 mg) by mouth daily.Recheck Hgb A1C in 3 months.On ACE inhibitor and Statin.Off ASA due to high risk for falls.   2. Chronic combined systolic and diastolic congestive heart failure No recent abrupt weight gain.Echo 11/02/16 reviewed EF 92-01% grade 2 diastolic Moderate LAE no PA HTN no significant valve disease.Lungs CTA bilateral lower extremities trace edema.continue on coreg 3.125 tablet twice daily,Furosemide 20 mg tablet daily and lisinopril 5 mg tablet daily.seen 06/05/2017 by Cardiologist Dr. Jenkins Rouge at [facility] no new orders patient to follow up with cardiologist in 6 months.     3. Edema Bilateral lower extremities trace edema.No abrupt weight gain,cough or shortness of breath noted.Continue Furosemide 20 mg tablet daily. Apply knee high ted hose on in the morning and off at bedtime.Continue to monitor weight.   4. Major depression, recurrent Mood stable but verbally abusive at times.continue on Bupropion SR 100 mg tablet daily.continue to  monitor for mood changes.   5. Hyperlipidemia LDL goal <70 LDL at goal 81 (06/05/2017).continue on simvastatin 5 mg tablet daily.recheck lipid panel in 3 months.    Family/ staff Communication: Reviewed plan of care with patient and facility Nurse  Labs/tests ordered: Hgb A1C and lipid panel in 3 months.   Sandrea Hughs, NP

## 2017-06-21 ENCOUNTER — Encounter: Payer: Self-pay | Admitting: Internal Medicine

## 2017-06-21 DIAGNOSIS — L570 Actinic keratosis: Secondary | ICD-10-CM | POA: Insufficient documentation

## 2017-07-08 ENCOUNTER — Encounter: Payer: Self-pay | Admitting: Family

## 2017-07-08 ENCOUNTER — Non-Acute Institutional Stay (SKILLED_NURSING_FACILITY): Payer: Medicare Other | Admitting: Family

## 2017-07-08 DIAGNOSIS — N182 Chronic kidney disease, stage 2 (mild): Secondary | ICD-10-CM

## 2017-07-08 DIAGNOSIS — I5042 Chronic combined systolic (congestive) and diastolic (congestive) heart failure: Secondary | ICD-10-CM | POA: Diagnosis not present

## 2017-07-08 DIAGNOSIS — F0281 Dementia in other diseases classified elsewhere with behavioral disturbance: Secondary | ICD-10-CM | POA: Diagnosis not present

## 2017-07-08 DIAGNOSIS — E1122 Type 2 diabetes mellitus with diabetic chronic kidney disease: Secondary | ICD-10-CM

## 2017-07-08 DIAGNOSIS — N3281 Overactive bladder: Secondary | ICD-10-CM

## 2017-07-08 DIAGNOSIS — F339 Major depressive disorder, recurrent, unspecified: Secondary | ICD-10-CM

## 2017-07-08 DIAGNOSIS — F02818 Dementia in other diseases classified elsewhere, unspecified severity, with other behavioral disturbance: Secondary | ICD-10-CM

## 2017-07-08 DIAGNOSIS — G308 Other Alzheimer's disease: Secondary | ICD-10-CM | POA: Diagnosis not present

## 2017-07-08 NOTE — Progress Notes (Signed)
Location:  [place] Room Number: 50 Place of Service:  SNF (31) Provider: Dinah [name] FNP-C   [name], MD  Patient Care Team: [name], MD as PCP - General (Internal Medicine) [name], [name], NP as Nurse Practitioner (Family Medicine)  Extended Emergency Contact Information Primary Emergency Contact: [name] Address: [place], [place] of [place] Phone: [phone] Mobile Phone: [phone] Relation: Spouse Secondary Emergency Contact: [name] [place] of [place] Phone: [phone] Work Phone: [phone] Relation: Daughter  Code Status:  DNR  Goals of care: Advanced Directive information Advanced Directives 07/08/2017  Does Patient Have a Medical Advance Directive? Yes  Type of Paramedic of [place];Living will  Does patient want to make changes to medical advance directive? -  Copy of [place] in Chart? Yes  Would patient like information on creating a medical advance directive? -  Pre-existing out of facility DNR order (yellow form or pink MOST form) -     Chief Complaint  Patient presents with  . Medical Management of Chronic Issues    monthly routine visit    HPI:  Pt is a 84 y.o. male seen today [facility] for medical management of chronic diseases.He has a medical history of CHF,Type 2 DM,hyperlipidemia,Alzheimer disease,depression,Anxiety,OAB among other conditions.He is seen in his room today with Nurse present.Facility Nurse reports no new concerns.No recent fall episode or weight changes reported.He had cryosurgery on scalp on 06/20/2017.His HPI and ROS limited due to dementia.     Past Medical History:  Diagnosis Date  . Abnormality of gait   . Backache, unspecified   . CHF (congestive heart failure) ([place])   . Colon polyps    adenomatous  . Dementia 11/12/2012  . Depression   . Diabetes mellitus  without complication ([place])   . Diverticulosis   . Essential and other specified forms of tremor   . Hemorrhoids   . History of bilateral hip replacements 11/12/2012  . Memory loss   . 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 Pain in joint, pelvic region and thigh   . Skin cancer of scalp   . Spinal stenosis, lumbar region, without neurogenic claudication   . Unspecified hereditary and idiopathic peripheral neuropathy    Past Surgical History:  Procedure Laterality Date  . CATARACT EXTRACTION, BILATERAL  2012  . JOINT REPLACEMENT    . RETINAL LASER PROCEDURE  2012   retinal wrinkle  . SKIN CANCER EXCISION    . TOTAL HIP ARTHROPLASTY Left 2000  . TOTAL HIP ARTHROPLASTY (aka REPLACEMENT) Right 2011    Allergies  Allergen Reactions  . Axona [Bacid]     Unknown per MAR  . Gabapentin     Hallucinations    Allergies as of 07/08/2017      Reactions   Axona [bacid]    Unknown per [facility]   Gabapentin    Hallucinations      Medication List        Accurate as of 07/08/17  4:09 PM. Always use your most recent med list.          acetaminophen 500 MG tablet Commonly known as:  TYLENOL Take 500 mg by mouth every 8 (eight) hours as needed (pain).   ALIGN 4 MG Caps Take 4 mg by mouth daily.   buPROPion 100 MG 12 hr tablet Commonly known as:  WELLBUTRIN SR Take 100 mg by mouth daily.   carvedilol 3.125 MG tablet Commonly known as:  COREG Take 1 tablet (3.125 mg total) by mouth 2 (two) times daily with a meal.   CERAVE Crea Apply 1 application topically daily as needed. Apply to arms, legs and trunk   donepezil 23 MG Tabs tablet Commonly known as:  ARICEPT Take 1 tablet (23 mg total) by mouth daily.   dorzolamide-timolol 22.3-6.8 MG/ML ophthalmic solution Commonly known as:  COSOPT Place 1 drop into the left eye 2 (two) times daily.   feeding supplement Liqd Commonly known as:  BOOST / RESOURCE BREEZE Take 1 Container by mouth  daily.   Flaxseed Oil 1000 MG Caps Take 1,000 mg by mouth daily.   folic acid 387 MCG tablet Commonly known as:  FOLVITE Take 800 mcg by mouth every evening.   furosemide 20 MG tablet Commonly known as:  LASIX Take 1 tablet (20 mg total) by mouth daily.   lisinopril 5 MG tablet Commonly known as:  PRINIVIL,ZESTRIL Take 1 tablet (5 mg total) by mouth daily.   loperamide 2 MG tablet Commonly known as:  IMODIUM A-D Take 2 mg by mouth 4 (four) times daily as needed for diarrhea or loose stools.   memantine 28 MG Cp24 24 hr capsule Commonly known as:  NAMENDA XR Take 1 capsule (28 mg total) by mouth daily.   metFORMIN 500 MG tablet Commonly known as:  GLUCOPHAGE Take 250 mg by mouth daily.   multivitamin tablet Take 1 tablet by mouth daily.   MYRBETRIQ 50 MG Tb24 tablet Generic drug:  mirabegron ER Take 50 mg by mouth daily.   PROCTOSOL HC 2.5 % rectal cream Generic drug:  hydrocortisone Place 1 application rectally at bedtime as needed for hemorrhoids or anal itching.   simvastatin 5 MG tablet Commonly known as:  ZOCOR Take 5 mg by mouth daily.   Vitamin D 1000 units capsule Take 1,000 Units by mouth daily.   ZINC OXIDE EX Apply 1 application topically 3 (three) times daily as needed.       Review of Systems  Unable to perform ROS: Dementia (additional information provided by facility nurse )  Constitutional: Negative for appetite change, chills and fever.  HENT: Negative for congestion, rhinorrhea, sinus pressure, sinus pain, sneezing and sore throat.   Eyes: Positive for visual disturbance. Negative for discharge and itching.       Wears eye glasses   Respiratory: Negative for cough, chest tightness, shortness of breath and wheezing.   Cardiovascular: Negative for chest pain, palpitations and leg swelling.  Gastrointestinal: Negative for abdominal distention, abdominal pain, constipation, diarrhea, nausea and vomiting.  Endocrine: Negative for cold  intolerance, heat intolerance, polydipsia, polyphagia and polyuria.  Genitourinary: Negative for dysuria, flank pain and urgency.  Musculoskeletal: Positive for gait problem.  Skin: Negative for color change, pallor, rash and wound.  Neurological: Negative for dizziness, light-headedness and headaches.  Psychiatric/Behavioral: Positive for confusion. Negative for agitation and sleep disturbance. The patient is not nervous/anxious.     Immunization History  Administered Date(s) Administered  . Influenza-Unspecified 01/10/2017  . PPD Test 06/24/2016   Pertinent  Health Maintenance Due  Topic Date Due  . FOOT EXAM  09/28/2017 (Originally 06/06/1940)  . OPHTHALMOLOGY EXAM  09/28/2017 (Originally 06/06/1940)  . INFLUENZA VACCINE  10/31/2017  . HEMOGLOBIN A1C  12/05/2017  . PNA vac Low Risk Adult  Completed   Fall Risk  12/07/2016 01/19/2016 07/21/2015  Falls in the past year?  No Yes Yes  Number falls in past yr: - 1 1  Injury with Fall? - No No  Risk for fall due to : - Impaired balance/gait -  Follow up - Falls prevention discussed Falls prevention discussed   Functional Status Survey:    Vitals:   07/08/17 1124  BP: 113/65  Pulse: 78  Resp: 20  Temp: (!) 97.4 F (36.3 C)  SpO2: 96%  Weight: 178 lb 3.2 oz (80.8 kg)  Height: 6' (1.829 m)   Body mass index is 24.17 kg/m. Physical Exam  Constitutional:  Tall built elderly in no acute distress   HENT:  Head: Normocephalic.  Right Ear: External ear normal.  Left Ear: External ear normal.  Mouth/Throat: Oropharynx is clear and moist. No oropharyngeal exudate.  Eyes: Pupils are equal, round, and reactive to light. Conjunctivae and EOM are normal. Right eye exhibits no discharge. Left eye exhibits no discharge. No scleral icterus.  Eye glasses in place   Neck: Normal range of motion. No JVD present. No thyromegaly present.  Cardiovascular: Normal rate, regular rhythm, normal heart sounds and intact distal pulses. Exam reveals no  gallop and no friction rub.  No murmur heard. Pulmonary/Chest: Effort normal and breath sounds normal. No respiratory distress. He has no wheezes. He has no rales.  Abdominal: Soft. Bowel sounds are normal. He exhibits no distension. There is no tenderness. There is no rebound and no guarding.  LBM 07/06/2017  Genitourinary:  Genitourinary Comments: Incontinent   Musculoskeletal: He exhibits no edema or tenderness.  Unsteady gait uses wheelchair.Moves x 4 extremities except left leg weakness.  Lymphadenopathy:    He has no cervical adenopathy.  Neurological: Gait abnormal.  Alert and oriented to self,place and city but not date and year.   Skin: Skin is warm and dry. No rash noted. No erythema. No pallor.  Psychiatric: He has a normal mood and affect. His speech is normal. Judgment normal. He is not agitated and not aggressive. Cognition and memory are impaired.  Growling and makes scary face at times when asked a question.    Labs reviewed: Recent Labs    11/28/16 2315  03/29/17 1240 03/30/17 0337 03/31/17 0602 04/18/17  NA  --    < > 136 139 141 140  140  K  --    < > 3.6 3.3* 3.6 3.9  3.9  CL  --    < > 103 110 112*  --   CO2  --    < > 25 22 22   --   GLUCOSE  --    < > 173* 127* 109*  --   BUN  --    < > 10 11 14  22*  CREATININE 0.94   < > 1.09 1.00 1.05 0.9  0.91  CALCIUM  --    < > 9.0 8.1* 8.3* 8.8  MG 1.8  --   --   --   --   --    < > = values in this interval not displayed.   Recent Labs    03/29/17 1240 03/30/17 0337 04/18/17  AST 25 20 14  14   ALT 25 17 14  14   ALKPHOS 113 75 84  84  BILITOT 0.6 0.8 0.4  PROT 6.8 5.3* 5.8  ALBUMIN 3.8 2.8* 3.4   Recent Labs    11/28/16 1129  03/29/17 1240 03/30/17 0337 03/31/17 0602 04/18/17  WBC 17.1*   < > 13.9* 6.4 6.7 6.5  6.5  NEUTROABS 14.9*  --  12.6*  --   --   --   HGB 14.0   < > 14.3 11.4* 11.0* 12.1*  12.1  HCT 40.4   < > 43.0 34.3* 33.7* 35*  35.3  MCV 94.4   < > 96.2 98.3 98.5  --   PLT 145*    < > 129* 100* 90* 131*   < > = values in this interval not displayed.   Lab Results  Component Value Date   TSH 2.40 06/26/2016   Lab Results  Component Value Date   HGBA1C 6.5 (H) 03/29/2017   Lab Results  Component Value Date   CHOL 143 06/04/2017   HDL 49 01/31/2017   LDLCALC 81 06/04/2017   TRIG 113 06/04/2017    Significant Diagnostic Results in last 30 days:  No results found.  Assessment/Plan 1. Chronic combined systolic and diastolic congestive heart failure No weight changes,edema or shortness of breath.continue on Furosemide 20 mg tablet daily,coreg 3.125 mg tablet twice daily and lisinopril 5 mg tablet daily.continue to monitor weight.   2. Type 2 diabetes mellitus with stage 2 chronic kidney disease, without long-term current use of insulin  Lab Results  Component Value Date   HGBA1C 6.5 (H) 03/29/2017   CBG log reviewed readings ranging in the 120's-160's.continue on metformin 250 mg tablet daily.continue on Zocor 5 mg tablet daily.off asprin due to high risk for falls.continue on no concentrated sweets diet.monitor Hgb A1C.    3. OAB (overactive bladder) Continue on myrbetriq 50 mg tablet daily.  4. Major depression, recurrent, chronic  Mood stable.continue on Bupropion 100 mg 12 Hr tablet daily.continue to monitor for mood changes.continue to encourage to participate in facility activities.   5. Alzheimer's disease of other onset with behavioral disturbance No new behavioral changes reported.continue to assist with ADL's.continue on Bupropion 100 mg 12 Hr tablet daily,Aricept 23 mg tablet daily and Memantine 28 mg 24 hr capsule daily.   Family/ staff Communication: Reviewed plan of care with patient and facility Nurse.   Labs/tests ordered: None   Dinah C [name], NP

## 2017-08-05 ENCOUNTER — Non-Acute Institutional Stay (SKILLED_NURSING_FACILITY): Payer: Medicare Other | Admitting: Internal Medicine

## 2017-08-05 ENCOUNTER — Encounter: Payer: Self-pay | Admitting: Internal Medicine

## 2017-08-05 DIAGNOSIS — F0281 Dementia in other diseases classified elsewhere with behavioral disturbance: Secondary | ICD-10-CM | POA: Diagnosis not present

## 2017-08-05 DIAGNOSIS — E1169 Type 2 diabetes mellitus with other specified complication: Secondary | ICD-10-CM

## 2017-08-05 DIAGNOSIS — E785 Hyperlipidemia, unspecified: Secondary | ICD-10-CM | POA: Diagnosis not present

## 2017-08-05 DIAGNOSIS — E1122 Type 2 diabetes mellitus with diabetic chronic kidney disease: Secondary | ICD-10-CM

## 2017-08-05 DIAGNOSIS — G309 Alzheimer's disease, unspecified: Secondary | ICD-10-CM

## 2017-08-05 DIAGNOSIS — I5042 Chronic combined systolic (congestive) and diastolic (congestive) heart failure: Secondary | ICD-10-CM | POA: Diagnosis not present

## 2017-08-05 DIAGNOSIS — N3281 Overactive bladder: Secondary | ICD-10-CM

## 2017-08-05 NOTE — Progress Notes (Signed)
Location:  [place] Room Number: 53 Place of Service:  SNF [phone]) Provider:  [name] MD  [name], MD  Patient Care Team: [name], MD as PCP - General (Internal Medicine) [name], [name], NP as Nurse Practitioner (Family Medicine)  Extended Emergency Contact Information Primary Emergency Contact: [name] Address: [place], [place] of [place] Phone: [phone] Mobile Phone: [phone] Relation: Spouse Secondary Emergency Contact: [name] States of [place] Phone: [phone] Work Phone: [phone] Relation: Daughter  Code Status:  DNR  Goals of care: Advanced Directive information Advanced Directives 08/05/2017  Does Patient Have a Medical Advance Directive? Yes  Type of Paramedic of [place];Living will  Does patient want to make changes to medical advance directive? No - Patient declined  Copy of [place] in Chart? Yes  Would patient like information on creating a medical advance directive? -  Pre-existing out of facility DNR order (yellow form or pink MOST form) -     Chief Complaint  Patient presents with  . Medical Management of Chronic Issues    Routine Visit     HPI:  Pt is a 84 y.o. male seen today for medical management of chronic diseases. He is pleasantly confused. He denies any concern this visit. No acute concern from nursing. He is seen in his room today. Denies any pain or discomfort.   Dementia with behavioral disturbance- behavior overall calm. Taking bupropion 100 mg daily. Also on folic acid supplement. Takes donepezil 23 mg daily and namenda 28 mg daily.   OAB- takes mirabegron 50 mg daily, has UI.   Hyperlipidemia- takes simvastatin 5 mg daily  Type 2 diabetes mellitus- with ckd. on metformin 250 mg daily. Blood sugar reviewed in 130-170 range with 2 readings of 201 and 226. Also on lisinopril.    Chronic systolic  And diastolic CHF- takes coreg 3.125 mg bid, lisinopril 5 mg daily and lasix 20 mg daily. Overall stable BP on review. Leg edema stable. Echocardiogram 10/2016 with EF 30-35% and grade 2 diastolic CHF.    Past Medical History:  Diagnosis Date  . Abnormality of gait   . Backache, unspecified   . CHF (congestive heart failure) ([place])   . Colon polyps    adenomatous  . Dementia 11/12/2012  . Depression   . Diabetes mellitus without complication ([place])   . Diverticulosis   . Essential and other specified forms of tremor   . Hemorrhoids   . History of bilateral hip replacements 11/12/2012  . Memory loss   . 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 Pain in joint, pelvic region and thigh   . Skin cancer of scalp   . Spinal stenosis, lumbar region, without neurogenic claudication   . Unspecified hereditary and idiopathic peripheral neuropathy    Past Surgical History:  Procedure Laterality Date  . CATARACT EXTRACTION, BILATERAL  2012  . JOINT REPLACEMENT    . RETINAL LASER PROCEDURE  2012   retinal wrinkle  . SKIN CANCER EXCISION    . TOTAL HIP ARTHROPLASTY Left 2000  . TOTAL HIP ARTHROPLASTY (aka REPLACEMENT) Right 2011    Allergies  Allergen Reactions  . Axona [Bacid]     Unknown per MAR  . Gabapentin     Hallucinations    Outpatient Encounter Medications as of 08/05/2017  Medication Sig  . acetaminophen (TYLENOL) 500  MG tablet Take 500 mg by mouth every 8 (eight) hours as needed (pain).   Marland Kitchen buPROPion (WELLBUTRIN SR) 100 MG 12 hr tablet Take 100 mg by mouth daily.  . carvedilol (COREG) 3.125 MG tablet Take 1 tablet (3.125 mg total) by mouth 2 (two) times daily with a meal.  . Cholecalciferol (VITAMIN D) 1000 UNITS capsule Take 1,000 Units by mouth daily.    Marland Kitchen donepezil (ARICEPT) 23 MG TABS tablet Take 1 tablet (23 mg total) by mouth daily.  . dorzolamide-timolol (COSOPT) 22.3-6.8 MG/ML ophthalmic solution Place 1 drop into  the left eye 2 (two) times daily.   . Emollient (CERAVE) CREA Apply 1 application topically daily as needed. Apply to arms, legs and trunk   . Flaxseed, Linseed, (FLAXSEED OIL) 1000 MG CAPS Take 1,000 mg by mouth daily.    . folic acid (FOLVITE) 782 MCG tablet Take 800 mcg by mouth every evening.   . furosemide (LASIX) 20 MG tablet Take 1 tablet (20 mg total) by mouth daily.  . hydrocortisone (PROCTOSOL HC) 2.5 % rectal cream Place 1 application rectally at bedtime as needed for hemorrhoids or anal itching.  Marland Kitchen lisinopril (PRINIVIL,ZESTRIL) 5 MG tablet Take 1 tablet (5 mg total) by mouth daily.  Marland Kitchen loperamide (IMODIUM A-D) 2 MG tablet Take 2 mg by mouth 4 (four) times daily as needed for diarrhea or loose stools.   . memantine (NAMENDA XR) 28 MG CP24 24 hr capsule Take 1 capsule (28 mg total) by mouth daily.  . metFORMIN (GLUCOPHAGE) 500 MG tablet Take 250 mg by mouth daily.   . mirabegron ER (MYRBETRIQ) 50 MG TB24 tablet Take 50 mg by mouth daily.  . Multiple Vitamin (MULTIVITAMIN) tablet Take 1 tablet by mouth daily.    Marland Kitchen nystatin cream (MYCOSTATIN) Apply 1 application topically 2 (two) times daily as needed for dry skin.  . Probiotic Product (ALIGN) 4 MG CAPS Take 4 mg by mouth daily.  . simvastatin (ZOCOR) 5 MG tablet Take 5 mg by mouth daily.   Marland Kitchen ZINC OXIDE EX Apply 1 application topically 3 (three) times daily as needed.   . [DISCONTINUED] feeding supplement (BOOST / RESOURCE BREEZE) LIQD Take 1 Container by mouth daily.    No facility-administered encounter medications on file as of 08/05/2017.     Review of Systems  Unable to perform ROS: Dementia (limited)  Constitutional: Negative for appetite change, fatigue and fever.  HENT: Positive for hearing loss. Negative for congestion, mouth sores and rhinorrhea.   Respiratory: Negative for cough and shortness of breath.   Cardiovascular: Positive for leg swelling. Negative for chest pain.  Gastrointestinal: Negative for abdominal pain,  constipation, diarrhea, nausea and vomiting.  Genitourinary: Negative for dysuria.       Has urinary incontinence  Musculoskeletal: Positive for gait problem. Negative for back pain.  Skin: Negative for rash and wound.  Neurological: Negative for dizziness and headaches.  Psychiatric/Behavioral: Positive for behavioral problems and confusion.    Immunization History  Administered Date(s) Administered  . Influenza-Unspecified 01/10/2017  . PPD Test 06/24/2016   Pertinent  Health Maintenance Due  Topic Date Due  . FOOT EXAM  09/28/2017 (Originally 06/06/1940)  . OPHTHALMOLOGY EXAM  09/28/2017 (Originally 06/06/1940)  . INFLUENZA VACCINE  10/31/2017  . HEMOGLOBIN A1C  12/05/2017  . PNA vac Low Risk Adult  Completed   Fall Risk  12/07/2016 01/19/2016 07/21/2015  Falls in the past year? No Yes Yes  Number falls in past yr: - 1 1  Injury  with Fall? - No No  Risk for fall due to : - Impaired balance/gait -  Follow up - Falls prevention discussed Falls prevention discussed   Functional Status Survey:    Vitals:   08/05/17 1239  BP: 127/79  Pulse: 83  Resp: 18  Temp: 98.3 F (36.8 C)  TempSrc: Oral  SpO2: 98%  Weight: 179 lb 1.6 oz (81.2 kg)  Height: 5\' 11"  (1.803 m)   Body mass index is 24.98 kg/m.   Wt Readings from Last 3 Encounters:  08/05/17 179 lb 1.6 oz (81.2 kg)  07/08/17 178 lb 3.2 oz (80.8 kg)  06/06/17 176 lb (79.8 kg)   Physical Exam  Constitutional: He appears well-developed and well-nourished. No distress.  HENT:  Head: Normocephalic and atraumatic.  Right Ear: External ear normal.  Left Ear: External ear normal.  Nose: Nose normal.  Mouth/Throat: Oropharynx is clear and moist.  Eyes: Pupils are equal, round, and reactive to light. Conjunctivae and EOM are normal. Right eye exhibits no discharge. Left eye exhibits no discharge.  Neck: Normal range of motion. Neck supple.  Cardiovascular: Normal rate and regular rhythm.  Pulmonary/Chest: Effort normal and  breath sounds normal. No respiratory distress. He has no wheezes. He has no rales.  Abdominal: Soft. Bowel sounds are normal. There is no tenderness.  Musculoskeletal: Normal range of motion. He exhibits edema.  Able to move all 4 extremities, weakness present, uses walker to ambulate  Lymphadenopathy:    He has no cervical adenopathy.  Neurological: He is alert.  Oriented to self and place only  Skin: Skin is warm and dry. He is not diaphoretic.  Psychiatric:  Gets agitated easily, grinds his teeth at people at times, calm this visit. Poor thought process and judgement    Labs reviewed: Recent Labs    11/28/16 2315  03/29/17 1240 03/30/17 0337 03/31/17 0602 04/18/17  NA  --    < > 136 139 141 140  140  K  --    < > 3.6 3.3* 3.6 3.9  3.9  CL  --    < > 103 110 112*  --   CO2  --    < > 25 22 22   --   GLUCOSE  --    < > 173* 127* 109*  --   BUN  --    < > 10 11 14  22*  CREATININE 0.94   < > 1.09 1.00 1.05 0.9  0.91  CALCIUM  --    < > 9.0 8.1* 8.3* 8.8  MG 1.8  --   --   --   --   --    < > = values in this interval not displayed.   Recent Labs    03/29/17 1240 03/30/17 0337 04/18/17  AST 25 20 14  14   ALT 25 17 14  14   ALKPHOS 113 75 84  84  BILITOT 0.6 0.8 0.4  PROT 6.8 5.3* 5.8  ALBUMIN 3.8 2.8* 3.4   Recent Labs    11/28/16 1129  03/29/17 1240 03/30/17 0337 03/31/17 0602 04/18/17  WBC 17.1*   < > 13.9* 6.4 6.7 6.5  6.5  NEUTROABS 14.9*  --  12.6*  --   --   --   HGB 14.0   < > 14.3 11.4* 11.0* 12.1*  12.1  HCT 40.4   < > 43.0 34.3* 33.7* 35*  35.3  MCV 94.4   < > 96.2 98.3 98.5  --   PLT  145*   < > 129* 100* 90* 131*   < > = values in this interval not displayed.   Lab Results  Component Value Date   TSH 2.40 06/26/2016   Lab Results  Component Value Date   HGBA1C 6.5 (H) 03/29/2017   Lab Results  Component Value Date   CHOL 143 06/04/2017   HDL 49 01/31/2017   LDLCALC 81 06/04/2017   TRIG 113 06/04/2017    Significant Diagnostic  Results in last 30 days:  No results found.  Assessment/Plan  1. Chronic combined systolic and diastolic congestive heart failure (HCC) Euvolemic, continue b blocker, lasix and ACEI. Check cmp.   2. Type 2 diabetes mellitus with chronic kidney disease, without long-term current use of insulin, unspecified CKD stage (HCC) Check a1c. Reviewed blood sugar. Continue low dose metformin and lisinopril. Monitor weight. Check TSH  3. Alzheimer's dementia with behavioral disturbance, unspecified timing of dementia onset Continue aricept and namenda for now with welbutrin, supportive care  4. OAB (overactive bladder) Has UI, not sure how much myrbetriq is helping him. Decrease myrbetriq to 25 mg daily for 2 weeks and stop. Perineal care.   5. Hyperlipidemia associated with type 2 diabetes mellitus ([place]) Reviewed lipid panel. Continue statin  Discontinue tylenol for non use  Family/ staff Communication: reviewed care plan with patient and charge nurse.    Labs/tests ordered:  Cbc, cmp with gfr, a1c, TSH   [name], MD Internal Medicine [facility] [address] [place], [place] [address] Cell Phone (Monday-Friday 8 am - 5 pm): [phone] On Call: [phone] and follow prompts after 5 pm and on weekends Office Phone: [phone] Office Fax: [phone]

## 2017-08-06 ENCOUNTER — Encounter: Payer: Self-pay | Admitting: Podiatry

## 2017-08-06 ENCOUNTER — Ambulatory Visit: Payer: Medicare Other | Admitting: Podiatry

## 2017-08-06 DIAGNOSIS — E1159 Type 2 diabetes mellitus with other circulatory complications: Secondary | ICD-10-CM | POA: Diagnosis not present

## 2017-08-06 DIAGNOSIS — B351 Tinea unguium: Secondary | ICD-10-CM

## 2017-08-06 DIAGNOSIS — M79676 Pain in unspecified toe(s): Secondary | ICD-10-CM

## 2017-08-06 DIAGNOSIS — I739 Peripheral vascular disease, unspecified: Secondary | ICD-10-CM | POA: Diagnosis not present

## 2017-08-06 DIAGNOSIS — I878 Other specified disorders of veins: Secondary | ICD-10-CM

## 2017-08-06 NOTE — Progress Notes (Signed)
Patient ID: Jose [name], male   DOB: [birth year], 84 y.o.   MRN: [id number] Complaint:  Visit Type: Patient returns to my office for continued preventative foot care services. Complaint: Patient states" my nails have grown long and thick and become painful to walk and wear shoes" .  Patient is diabetic. He presents for preventative foot care services.  Podiatric Exam: Vascular: dorsalis pedis and posterior tibial pulses are not  palpable bilateral. Capillary return is diminished. Temperature gradient is diminished. Skin turgor  diminished .  Purplish discoloration feet  B/L Sensorium: Normal Semmes Weinstein monofilament test. Normal tactile sensation bilaterally. Nail Exam: Pt has thick disfigured discolored nails with subungual debris noted bilateral entire nail hallux through fifth toenails Ulcer Exam: There is no evidence of ulcer or pre-ulcerative changes or infection. Orthopedic Exam: Muscle tone and strength are WNL. No limitations in general ROM. No crepitus or effusions noted. Foot type and digits show no abnormalities. Bony prominences are unremarkable. Skin: No Porokeratosis. No infection or ulcers  Diagnosis:  Tinea unguium, Pain in right toe, pain in left toes, PVD  Treatment & Plan Procedures and Treatment: Consent by patient was obtained for treatment procedures. The patient understood the discussion of treatment and procedures well. All questions were answered thoroughly reviewed. Debridement of mycotic and hypertrophic toenails, 1 through 5 bilateral and clearing of subungual debris. No ulceration, no infection noted.  Return Visit-Office Procedure: Patient instructed to return to the office for a follow up visit 3 months for continued evaluation and treatment.  Gardiner Barefoot DPM

## 2017-08-08 LAB — CBC
HCT: 40.3
HEMOGLOBIN: 14.3
MCV: 93.1
PLATELET COUNT: 155
WBC: 8.6

## 2017-08-08 LAB — COMPLETE METABOLIC PANEL WITH GFR
ALT: 22
AST: 17
Albumin: 4.1
Alkaline Phosphatase: 109
BUN: 19 (ref 4–21)
Calcium: 9.5
Creat: 0.97
EGFR (Non-African Amer.): 71
GLUCOSE: 182
POTASSIUM: 4
Protein: 6.7
SODIUM: 137
Total Bilirubin: 0.5

## 2017-08-08 LAB — HEMOGLOBIN A1C: A1c: 8.1

## 2017-08-08 LAB — TSH: TSH: 2.61

## 2017-08-09 ENCOUNTER — Encounter: Payer: Self-pay | Admitting: Family

## 2017-08-09 ENCOUNTER — Non-Acute Institutional Stay (SKILLED_NURSING_FACILITY): Payer: Medicare Other | Admitting: Family

## 2017-08-09 DIAGNOSIS — E1122 Type 2 diabetes mellitus with diabetic chronic kidney disease: Secondary | ICD-10-CM | POA: Diagnosis not present

## 2017-08-09 MED ORDER — METFORMIN HCL 500 MG PO TABS: 500.0000 mg | ORAL_TABLET | Freq: Every day | ORAL | 3 refills | Status: DC

## 2017-08-09 NOTE — Progress Notes (Signed)
Location:  [place] Room Number: 61 Place of Service:  SNF (31) Provider: Guynell Kleiber FNP-C  [name], MD  Patient Care Team: [name], MD as PCP - General (Internal Medicine) Jaedyn Lard, [name], NP as Nurse Practitioner (Family Medicine)  Extended Emergency Contact Information Primary Emergency Contact: [name] Address: [place], [place] Phone: [phone] Mobile Phone: [phone] Relation: Spouse Secondary Emergency Contact: [name] [place] of [place] Phone: [phone] Work Phone: [phone] Relation: Daughter  Code Status:  DNR Goals of care: Advanced Directive information Advanced Directives 08/09/2017  Does Patient Have a Medical Advance Directive? Yes  Type of Paramedic of [place];Living will  Does patient want to make changes to medical advance directive? -  Copy of [place] in Chart? Yes  Would patient like information on creating a medical advance directive? -  Pre-existing out of facility DNR order (yellow form or pink MOST form) -     Chief Complaint  Patient presents with  . Acute Visit    abnormal labs    HPI:  Jose [name] is a 84 y.o. male seen today at [facility] for an acute visit for evaluation of abnormal lab results.he is seen in his room today.His HPI and ROS limited due to cognitive impairment.Facility Nurse reports no new concerns.His lab resulted Hgb A1C 8.1(08/08/2017). His CBG log readings ranging in the 130's-190's. No signs of hypo/hyperglycemia reported.     Past Medical History:  Diagnosis Date  . Abnormality of gait   . Backache, unspecified   . CHF (congestive heart failure) ([place])   . Colon polyps    adenomatous  . Dementia 11/12/2012  . Depression   . Diabetes mellitus without complication ([place])   . Diverticulosis   . Essential and other specified forms of tremor   .  Hemorrhoids   . History of bilateral hip replacements 11/12/2012  . Memory loss   . 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 Pain in joint, pelvic region and thigh   . Skin cancer of scalp   . Spinal stenosis, lumbar region, without neurogenic claudication   . Unspecified hereditary and idiopathic peripheral neuropathy    Past Surgical History:  Procedure Laterality Date  . CATARACT EXTRACTION, BILATERAL  2012  . JOINT REPLACEMENT    . RETINAL LASER PROCEDURE  2012   retinal wrinkle  . SKIN CANCER EXCISION    . TOTAL HIP ARTHROPLASTY Left 2000  . TOTAL HIP ARTHROPLASTY (aka REPLACEMENT) Right 2011    Allergies  Allergen Reactions  . Axona [Bacid]     Unknown per MAR  . Gabapentin     Hallucinations    Outpatient Encounter Medications as of 08/09/2017  Medication Sig  . buPROPion (WELLBUTRIN SR) 100 MG 12 hr tablet Take 100 mg by mouth daily.  . carvedilol (COREG) 3.125 MG tablet Take 1 tablet (3.125 mg total) by mouth 2 (two) times daily with a meal.  . Cholecalciferol (VITAMIN D) 1000 UNITS capsule Take 1,000 Units by mouth daily.    Marland Kitchen donepezil (ARICEPT) 23 MG TABS tablet Take 1 tablet (23 mg total) by mouth daily.  . dorzolamide-timolol (COSOPT) 22.3-6.8 MG/ML ophthalmic solution Place 1 drop into the left eye 2 (two) times daily.   . Emollient (CERAVE) CREA Apply 1 application topically daily as needed. Apply to arms, legs and trunk   .  Flaxseed, Linseed, (FLAXSEED OIL) 1000 MG CAPS Take 1,000 mg by mouth daily.    . folic acid (FOLVITE) 355 MCG tablet Take 800 mcg by mouth every evening.   . furosemide (LASIX) 20 MG tablet Take 1 tablet (20 mg total) by mouth daily.  . hydrocortisone (PROCTOSOL HC) 2.5 % rectal cream Place 1 application rectally at bedtime as needed for hemorrhoids or anal itching.  Marland Kitchen lisinopril (PRINIVIL,ZESTRIL) 5 MG tablet Take 1 tablet (5 mg total) by mouth daily.  Marland Kitchen loperamide (IMODIUM A-D) 2 MG tablet Take 2 mg by  mouth 4 (four) times daily as needed for diarrhea or loose stools.   . memantine (NAMENDA XR) 28 MG CP24 24 hr capsule Take 1 capsule (28 mg total) by mouth daily.  . metFORMIN (GLUCOPHAGE) 500 MG tablet Take 250 mg by mouth daily.   . mirabegron ER (MYRBETRIQ) 50 MG TB24 tablet Take 25 mg by mouth daily.   . Multiple Vitamin (MULTIVITAMIN) tablet Take 1 tablet by mouth daily.    Marland Kitchen nystatin cream (MYCOSTATIN) Apply 1 application topically 2 (two) times daily as needed for dry skin.  . Probiotic Product (ALIGN) 4 MG CAPS Take 4 mg by mouth daily.  . simvastatin (ZOCOR) 10 MG tablet   . ZINC OXIDE EX Apply 1 application topically 3 (three) times daily as needed.   . [DISCONTINUED] acetaminophen (TYLENOL) 500 MG tablet Take 500 mg by mouth every 8 (eight) hours as needed (pain).    No facility-administered encounter medications on file as of 08/09/2017.     Review of Systems  Reason unable to perform ROS: additional information provided by facility Nurse   Constitutional: Negative for chills, fatigue, fever and unexpected weight change.  Eyes: Positive for visual disturbance. Negative for discharge, redness and itching.       Wears eye glasses  Respiratory: Negative for cough, chest tightness, shortness of breath and wheezing.   Cardiovascular: Negative for chest pain, palpitations and leg swelling.  Gastrointestinal: Negative for abdominal distention, abdominal pain, constipation, diarrhea, nausea and vomiting.       LBM 08/08/2017  Endocrine: Negative for cold intolerance, heat intolerance, polydipsia, polyphagia and polyuria.  Musculoskeletal: Positive for gait problem.  Skin: Negative for color change, pallor, rash and wound.  Neurological: Negative for dizziness, light-headedness and headaches.  Psychiatric/Behavioral: Negative for agitation and sleep disturbance. The patient is not nervous/anxious.        Confused at his baseline     Immunization History  Administered Date(s)  Administered  . Influenza-Unspecified 01/10/2017  . PPD Test 06/24/2016   Pertinent  Health Maintenance Due  Topic Date Due  . FOOT EXAM  09/28/2017 (Originally 06/06/1940)  . OPHTHALMOLOGY EXAM  09/28/2017 (Originally 06/06/1940)  . INFLUENZA VACCINE  10/31/2017  . HEMOGLOBIN A1C  12/05/2017  . PNA vac Low Risk Adult  Completed   Fall Risk  12/07/2016 01/19/2016 07/21/2015  Falls in the past year? No Yes Yes  Number falls in past yr: - 1 1  Injury with Fall? - No No  Risk for fall due to : - Impaired balance/gait -  Follow up - Falls prevention discussed Falls prevention discussed    Vitals:   08/09/17 1109  BP: 117/73  Pulse: 84  Resp: 20  Temp: 98.1 F (36.7 C)  SpO2: 96%  Weight: 178 lb 12.8 oz (81.1 kg)  Height: 5\' 11"  (1.803 m)   Body mass index is 24.94 kg/m. Physical Exam  Constitutional:  Tall built elderly in no acute  distress   HENT:  Head: Normocephalic.  Mouth/Throat: Oropharynx is clear and moist. No oropharyngeal exudate.  Eyes: Pupils are equal, round, and reactive to light. Conjunctivae are normal. Right eye exhibits no discharge. Left eye exhibits no discharge. No scleral icterus.  Neck: Normal range of motion. No JVD present. No thyromegaly present.  Cardiovascular: Normal rate, regular rhythm, normal heart sounds and intact distal pulses. Exam reveals no gallop and no friction rub.  No murmur heard. Pulmonary/Chest: Effort normal and breath sounds normal. No respiratory distress. He has no wheezes. He has no rales.  Abdominal: Soft. Bowel sounds are normal. He exhibits no distension and no mass. There is no tenderness. There is no rebound and no guarding.  Musculoskeletal: He exhibits no edema or tenderness.  Unsteady gait uses wheelchair   Lymphadenopathy:    He has no cervical adenopathy.  Neurological:  Pleasantly confused at his baseline.  Skin: Skin is warm and dry. No rash noted. No pallor.  Psychiatric: He has a normal mood and affect. His  behavior is normal. Judgment and thought content normal. Cognition and memory are impaired.  Nursing note and vitals reviewed.   Labs reviewed: Recent Labs    11/28/16 2315  03/29/17 1240 03/30/17 0337 03/31/17 0602 04/18/17 08/08/17  NA  --    < > 136 139 141 140  140 137  K  --    < > 3.6 3.3* 3.6 3.9  3.9 4.0  CL  --    < > 103 110 112*  --   --   CO2  --    < > 25 22 22   --   --   GLUCOSE  --    < > 173* 127* 109*  --   --   BUN  --    < > 10 11 14  22* 19  CREATININE 0.94   < > 1.09 1.00 1.05 0.9  0.91 0.97  CALCIUM  --    < > 9.0 8.1* 8.3* 8.8 9.5  MG 1.8  --   --   --   --   --   --    < > = values in this interval not displayed.   Recent Labs    03/29/17 1240 03/30/17 0337 04/18/17 08/08/17  AST 25 20 14  14 17   ALT 25 17 14  14 22   ALKPHOS 113 75 84  84 109  BILITOT 0.6 0.8 0.4 0.5  PROT 6.8 5.3* 5.8  --   ALBUMIN 3.8 2.8* 3.4 4.1   Recent Labs    11/28/16 1129  03/29/17 1240 03/30/17 0337 03/31/17 0602 04/18/17 08/08/17  WBC 17.1*   < > 13.9* 6.4 6.7 6.5  6.5 8.6  NEUTROABS 14.9*  --  12.6*  --   --   --   --   HGB 14.0   < > 14.3 11.4* 11.0* 12.1*  12.1 14.3  HCT 40.4   < > 43.0 34.3* 33.7* 35*  35.3 40.3  MCV 94.4   < > 96.2 98.3 98.5  --  93.1  PLT 145*   < > 129* 100* 90* 131*  --    < > = values in this interval not displayed.   Lab Results  Component Value Date   TSH 2.61 08/08/2017   Lab Results  Component Value Date   HGBA1C 6.5 (H) 03/29/2017   Lab Results  Component Value Date   CHOL 143 06/04/2017   HDL 49 01/31/2017   [place]  81 06/04/2017   TRIG 113 06/04/2017    Significant Diagnostic Results in last 30 days:  No results found.  Assessment/Plan   Type 2 diabetes mellitus with chronic kidney disease, without long-term current use of insulin, unspecified CKD stage  Hgb A1C 8.1 ( 08/08/2017) CBG log reviewed readings 130's-190's with periodic 200's. Will increase metformin to 500 mg tablet daily.continue on ACE inhibitor  and Statin.seen by podiatrist 08/06/2017.continue to monitor.check Hgb A1C in 3 months.   Family/ staff Communication: Reviewed plan of care with patient and facility Nurse.  Labs/tests ordered:  Hgb A1C in 3 months.   Sandrea Hughs, NP

## 2017-09-02 ENCOUNTER — Encounter: Payer: Self-pay | Admitting: Family

## 2017-09-02 ENCOUNTER — Non-Acute Institutional Stay (SKILLED_NURSING_FACILITY): Payer: Medicare Other | Admitting: Family

## 2017-09-02 DIAGNOSIS — F0281 Dementia in other diseases classified elsewhere with behavioral disturbance: Secondary | ICD-10-CM

## 2017-09-02 DIAGNOSIS — I5042 Chronic combined systolic (congestive) and diastolic (congestive) heart failure: Secondary | ICD-10-CM | POA: Diagnosis not present

## 2017-09-02 DIAGNOSIS — E1122 Type 2 diabetes mellitus with diabetic chronic kidney disease: Secondary | ICD-10-CM | POA: Diagnosis not present

## 2017-09-02 DIAGNOSIS — N3281 Overactive bladder: Secondary | ICD-10-CM

## 2017-09-02 DIAGNOSIS — I739 Peripheral vascular disease, unspecified: Secondary | ICD-10-CM

## 2017-09-02 DIAGNOSIS — R609 Edema, unspecified: Secondary | ICD-10-CM | POA: Diagnosis not present

## 2017-09-02 DIAGNOSIS — G309 Alzheimer's disease, unspecified: Secondary | ICD-10-CM | POA: Diagnosis not present

## 2017-09-02 DIAGNOSIS — N182 Chronic kidney disease, stage 2 (mild): Secondary | ICD-10-CM

## 2017-09-02 NOTE — Progress Notes (Addendum)
Location:  [place] Room Number: 92 Place of Service:  SNF (31) Provider: Massiah Longanecker FNP-C   [name], MD  Patient Care Team: [name], MD as PCP - General (Internal Medicine) Donivin Wirt, [name], NP as Nurse Practitioner (Family Medicine)  Extended Emergency Contact Information Primary Emergency Contact: [name] Address: [place], [place] Phone: [phone] Mobile Phone: [phone] Relation: Spouse Secondary Emergency Contact: [name] [place] of [place] Phone: [phone] Work Phone: [phone] Relation: Daughter  Code Status:  Full code  Goals of care: Advanced Directive information Advanced Directives 09/02/2017  Does Patient Have a Medical Advance Directive? Yes  Type of Paramedic of [place];Living will  Does patient want to make changes to medical advance directive? -  Copy of [place] in Chart? Yes  Would patient like information on creating a medical advance directive? -  Pre-existing out of facility DNR order (yellow form or pink MOST form) -     Chief Complaint  Patient presents with  . Medical Management of Chronic Issues    HPI:  Pt is a 84 y.o. male seen today [facility] for medical management of chronic diseases.He has a medical history of type 2 DM,CKD stage 2,peripheral Neuropathy,CHF, OAB,Alzheimer disease among other conditions.He is seen in his room today.He continues to be verbally abusive tells staff and other residents to go to [place].Facility Nurse reports no recent fall episodes or acute illness.Weight log reviewed no weight changes.He requires assistance with his ADL's.    Past Medical History:  Diagnosis Date  . Abnormality of gait   . Backache, unspecified   . CHF (congestive heart failure) ([place])   . Colon polyps    adenomatous  . Dementia 11/12/2012  . Depression   . Diabetes  mellitus without complication ([place])   . Diverticulosis   . Essential and other specified forms of tremor   . Hemorrhoids   . History of bilateral hip replacements 11/12/2012  . Memory loss   . 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 Pain in joint, pelvic region and thigh   . Skin cancer of scalp   . Spinal stenosis, lumbar region, without neurogenic claudication   . Unspecified hereditary and idiopathic peripheral neuropathy    Past Surgical History:  Procedure Laterality Date  . CATARACT EXTRACTION, BILATERAL  2012  . JOINT REPLACEMENT    . RETINAL LASER PROCEDURE  2012   retinal wrinkle  . SKIN CANCER EXCISION    . TOTAL HIP ARTHROPLASTY Left 2000  . TOTAL HIP ARTHROPLASTY (aka REPLACEMENT) Right 2011    Allergies  Allergen Reactions  . Axona [Bacid]     Unknown per MAR  . Gabapentin     Hallucinations    Allergies as of 09/02/2017      Reactions   Axona [bacid]    Unknown per MAR   Gabapentin    Hallucinations      Medication List        Accurate as of 09/02/17  2:58 PM. Always use your most recent med list.          ALIGN 4 MG Caps Take 4 mg by mouth daily.   buPROPion 100 MG 12 hr tablet Commonly known as:  WELLBUTRIN SR Take 100 mg by mouth daily.   carvedilol 3.125 MG tablet Commonly known as:  COREG Take  1 tablet (3.125 mg total) by mouth 2 (two) times daily with a meal.   CERAVE Crea Apply 1 application topically daily as needed. Apply to arms, legs and trunk   donepezil 23 MG Tabs tablet Commonly known as:  ARICEPT Take 1 tablet (23 mg total) by mouth daily.   dorzolamide-timolol 22.3-6.8 MG/ML ophthalmic solution Commonly known as:  COSOPT Place 1 drop into the left eye 2 (two) times daily.   Flaxseed Oil 1000 MG Caps Take 1,000 mg by mouth daily.   folic acid 269 MCG tablet Commonly known as:  FOLVITE Take 800 mcg by mouth every evening.   furosemide 20 MG tablet Commonly known as:  LASIX Take 1  tablet (20 mg total) by mouth daily.   lisinopril 5 MG tablet Commonly known as:  PRINIVIL,ZESTRIL Take 1 tablet (5 mg total) by mouth daily.   loperamide 2 MG tablet Commonly known as:  IMODIUM A-D Take 2 mg by mouth 4 (four) times daily as needed for diarrhea or loose stools.   memantine 28 MG Cp24 24 hr capsule Commonly known as:  NAMENDA XR Take 1 capsule (28 mg total) by mouth daily.   metFORMIN 500 MG tablet Commonly known as:  GLUCOPHAGE Take 1 tablet (500 mg total) by mouth daily with breakfast.   multivitamin tablet Take 1 tablet by mouth daily.   nystatin cream Commonly known as:  MYCOSTATIN Apply 1 application topically 2 (two) times daily as needed for dry skin.   PROCTOSOL HC 2.5 % rectal cream Generic drug:  hydrocortisone Place 1 application rectally at bedtime as needed for hemorrhoids or anal itching.   simvastatin 10 MG tablet Commonly known as:  ZOCOR Take 5 mg by mouth daily at 6 PM.   Vitamin D 1000 units capsule Take 1,000 Units by mouth daily.   ZINC OXIDE EX Apply 1 application topically 3 (three) times daily as needed.       Review of Systems  Unable to perform ROS: Dementia (additional information provided by facility Nurse )  Constitutional: Negative for appetite change, chills, fatigue, fever and unexpected weight change.  HENT: Negative for congestion, rhinorrhea, sinus pressure, sinus pain, sneezing and sore throat.   Eyes: Positive for visual disturbance. Negative for discharge, redness and itching.       Wears eye glasses   Respiratory: Negative for cough, chest tightness, shortness of breath and wheezing.   Cardiovascular: Positive for leg swelling. Negative for chest pain and palpitations.  Gastrointestinal: Negative for abdominal distention, abdominal pain, constipation, diarrhea, nausea and vomiting.  Endocrine: Negative for cold intolerance, heat intolerance, polydipsia, polyphagia and polyuria.  Genitourinary: Negative for  dysuria, flank pain and urgency.  Musculoskeletal: Positive for gait problem.  Skin: Negative for color change, pallor, rash and wound.  Neurological: Negative for dizziness, light-headedness and headaches.  Hematological: Does not bruise/bleed easily.  Psychiatric/Behavioral: Negative for agitation, confusion and sleep disturbance. The patient is not nervous/anxious.     Immunization History  Administered Date(s) Administered  . Influenza-Unspecified 01/10/2017  . PPD Test 06/24/2016   Pertinent  Health Maintenance Due  Topic Date Due  . FOOT EXAM  09/28/2017 (Originally 06/06/1940)  . OPHTHALMOLOGY EXAM  09/28/2017 (Originally 06/06/1940)  . INFLUENZA VACCINE  10/31/2017  . HEMOGLOBIN A1C  02/08/2018  . PNA vac Low Risk Adult  Completed   Fall Risk  12/07/2016 01/19/2016 07/21/2015  Falls in the past year? No Yes Yes  Number falls in past yr: - 1 1  Injury with Fall? -  No No  Risk for fall due to : - Impaired balance/gait -  Follow up - Falls prevention discussed Falls prevention discussed    Vitals:   09/02/17 1033  BP: (!) 144/94  Pulse: (!) 113  Resp: 19  Temp: 97.8 F (36.6 C)  SpO2: 95%  Weight: 179 lb 3.2 oz (81.3 kg)  Height: 5\' 11"  (1.803 m)   Body mass index is 24.99 kg/m. Physical Exam  Constitutional: He appears well-developed and well-nourished. He is cooperative. No distress.  HENT:  Head: Normocephalic.  Mouth/Throat: Oropharynx is clear and moist. No oropharyngeal exudate.  Bilateral TM not visualized due to cerumen impaction  Eyes: Pupils are equal, round, and reactive to light. Conjunctivae and EOM are normal. Right eye exhibits no discharge. Left eye exhibits no discharge. No scleral icterus.  Eye glasses in place   Neck: Normal range of motion. No JVD present.  Cardiovascular: Normal rate, regular rhythm and normal heart sounds. Exam reveals decreased pulses. Exam reveals no gallop and no friction rub.  No murmur heard. Pulses:      Dorsalis pedis  pulses are 1+ on the right side, and 1+ on the left side.  Pulmonary/Chest: Effort normal and breath sounds normal. No respiratory distress. He has no wheezes. He has no rales.  Abdominal: Soft. Bowel sounds are normal. He exhibits no distension and no mass. There is no tenderness. There is no rebound and no guarding.  Genitourinary:  Genitourinary Comments: Incontinent   Musculoskeletal: Normal range of motion. He exhibits edema. He exhibits no tenderness.  Lymphadenopathy:    He has no cervical adenopathy.  Neurological: He is alert.  Skin: Skin is warm and dry. No rash noted. No erythema. No pallor.  Psychiatric: He has a normal mood and affect. His speech is normal. Thought content normal. Cognition and memory are impaired.  Verbally abusive at times    Labs reviewed: Recent Labs    11/28/16 2315  03/29/17 1240 03/30/17 0337 03/31/17 0602 04/18/17 08/08/17  NA  --    < > 136 139 141 140  140 137  K  --    < > 3.6 3.3* 3.6 3.9  3.9 4.0  CL  --    < > 103 110 112*  --   --   CO2  --    < > 25 22 22   --   --   GLUCOSE  --    < > 173* 127* 109*  --   --   BUN  --    < > 10 11 14  22* 19  CREATININE 0.94   < > 1.09 1.00 1.05 0.9  0.91 0.97  CALCIUM  --    < > 9.0 8.1* 8.3* 8.8 9.5  MG 1.8  --   --   --   --   --   --    < > = values in this interval not displayed.   Recent Labs    03/29/17 1240 03/30/17 0337 04/18/17 08/08/17  AST 25 20 14  14 17   ALT 25 17 14  14 22   ALKPHOS 113 75 84  84 109  BILITOT 0.6 0.8 0.4 0.5  PROT 6.8 5.3* 5.8  --   ALBUMIN 3.8 2.8* 3.4 4.1   Recent Labs    11/28/16 1129  03/29/17 1240 03/30/17 0337 03/31/17 0602 04/18/17 08/08/17  WBC 17.1*   < > 13.9* 6.4 6.7 6.5  6.5 8.6  NEUTROABS 14.9*  --  12.6*  --   --   --   --  HGB 14.0   < > 14.3 11.4* 11.0* 12.1*  12.1 14.3  HCT 40.4   < > 43.0 34.3* 33.7* 35*  35.3 40.3  MCV 94.4   < > 96.2 98.3 98.5  --  93.1  PLT 145*   < > 129* 100* 90* 131*  --    < > = values in this interval  not displayed.   Lab Results  Component Value Date   TSH 2.61 08/08/2017   Lab Results  Component Value Date   HGBA1C 6.5 (H) 03/29/2017   Lab Results  Component Value Date   CHOL 143 06/04/2017   HDL 49 01/31/2017   LDLCALC 81 06/04/2017   TRIG 113 06/04/2017    Significant Diagnostic Results in last 30 days:  No results found.  Assessment/Plan 1. Edema Bilateral lower extremities 1-2+ edema,ted hose off during visit.bilateral Dorsalis pedal pulses diminished.No ulceration noted.Will order bilateral arterial brachial index rule out PVD.continue on furosemide 20 mg tablet daily.continue to monitor for  Weight changes.  2. Chronic combined systolic and diastolic congestive heart failure Weight stable.Bilateral lower extremities edema.Lungs CTA.No cough noted.continue on coreg 3.125 mg tablet twice daily and lisinopril 5 mg tablet daily.continue to monitor weight.  3. Type 2 diabetes mellitus with stage 2 chronic kidney disease, without long-term current use of insulin  CBG log readings reviewed readings ranging in the 80's-low 200's.continue on Metformin 500 mg tablet daily.On ACE inhibitor and Statin but off ASA due to high risk for falls.   4. OAB (overactive bladder) Incontinent.off mirabegron ER tablet.continue to monitor.   5. Alzheimer's dementia with behavioral disturbance, unspecified timing of dementia onset Verbally abusive at times but cooperative with care.No aggressive behavior noted. continue on Namenda XR 28 mg tablet daily and Aricept 23 mg tablet daily.continue to assist with ADL's.continuewith supportive care.  Addendum 09/04/2017:  Peripheral Vascular disease Arterial brachial index 09/03/2017 consistent with mild peripheral vascular disease without occlusion on bilateral lower extremities.Also mild plaque also noted within visualized arteries.ABI results discussed with patient and POA Jermery Caratachea at bedside.Patient currently on a Statin not on any Asprin.Patient  high risk for fall Asprin's benefits verse risk of bleeding discussed with POA. POA does not want any Aspirin for now.will continue to monitor blood pressure and control blood sugars.Apply knee high ted hose on in the morning and off at bedtime.Elevate legs when in lying in bed or sitting on recliner to reduce edema. POA states will provide knee Ted hose.   Family/ staff Communication: Reviewed plan of care with patient and facility Nurse.   Labs/tests ordered: None   Tahiri Shareef C Pearlina Friedly, NP

## 2017-09-05 LAB — LIPID PANEL
Cholesterol: 150
HDL: 40
LDL CALC: 89
Triglycerides: 113

## 2017-09-05 LAB — HEMOGLOBIN A1C
A1c: 8.6
HEMOGLOBIN A1C: 8.6

## 2017-09-06 ENCOUNTER — Encounter: Payer: Self-pay | Admitting: Family

## 2017-09-06 ENCOUNTER — Encounter: Payer: Self-pay | Admitting: *Deleted

## 2017-09-06 ENCOUNTER — Non-Acute Institutional Stay (SKILLED_NURSING_FACILITY): Payer: Medicare Other | Admitting: Family

## 2017-09-06 DIAGNOSIS — E1122 Type 2 diabetes mellitus with diabetic chronic kidney disease: Secondary | ICD-10-CM | POA: Diagnosis not present

## 2017-09-06 DIAGNOSIS — N182 Chronic kidney disease, stage 2 (mild): Secondary | ICD-10-CM | POA: Diagnosis not present

## 2017-09-06 NOTE — Progress Notes (Signed)
Location:  [place] Room Number: 52 Place of Service:  SNF (31) Provider: Tora Prunty FNP-C  [name], MD  Patient Care Team: [name], MD as PCP - General (Internal Medicine) Daisie Haft, [name], NP as Nurse Practitioner (Family Medicine)  Extended Emergency Contact Information Primary Emergency Contact: [name] Address: [address], [place] Phone: [phone] Mobile Phone: [phone] Relation: Spouse Secondary Emergency Contact: [name] [place] of [place] Phone: [phone] Work Phone: [phone] Relation: Daughter  Code Status:  DNR Goals of care: Advanced Directive information Advanced Directives 09/06/2017  Does Patient Have a Medical Advance Directive? Yes  Type of Paramedic of [place];Living will  Does patient want to make changes to medical advance directive? -  Copy of [place] in Chart? Yes  Would patient like information on creating a medical advance directive? -  Pre-existing out of facility DNR order (yellow form or pink MOST form) -     Chief Complaint  Patient presents with  . Acute Visit    abnormal labs    HPI:  Pt is a 84 y.o. male seen today at [facility] for an acute visit for evaluation of abnormal lab results.He is seen in his room today.He is very cooperative this visit.His Hgb A1C 8.6 ( 09/05/2017). His recent CBG log readings ranging in the 180's and 200's.No signs of hypo/hyperglycemia reported. He states has yummy yummy appetite. Facility Nurse reports no new concerns.    Past Medical History:  Diagnosis Date  . Abnormality of gait   . Backache, unspecified   . CHF (congestive heart failure) ([place])   . Colon polyps    adenomatous  . Dementia 11/12/2012  . Depression   . Diabetes mellitus without complication ([place])   . Diverticulosis   . Essential and other specified forms of  tremor   . Hemorrhoids   . History of bilateral hip replacements 11/12/2012  . Memory loss   . 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 Pain in joint, pelvic region and thigh   . Skin cancer of scalp   . Spinal stenosis, lumbar region, without neurogenic claudication   . Unspecified hereditary and idiopathic peripheral neuropathy    Past Surgical History:  Procedure Laterality Date  . CATARACT EXTRACTION, BILATERAL  2012  . JOINT REPLACEMENT    . RETINAL LASER PROCEDURE  2012   retinal wrinkle  . SKIN CANCER EXCISION    . TOTAL HIP ARTHROPLASTY Left 2000  . TOTAL HIP ARTHROPLASTY (aka REPLACEMENT) Right 2011    Allergies  Allergen Reactions  . Axona [Bacid]     Unknown per MAR  . Gabapentin     Hallucinations    Outpatient Encounter Medications as of 09/06/2017  Medication Sig  . buPROPion (WELLBUTRIN SR) 100 MG 12 hr tablet Take 100 mg by mouth daily.  . carvedilol (COREG) 3.125 MG tablet Take 1 tablet (3.125 mg total) by mouth 2 (two) times daily with a meal.  . Cholecalciferol (VITAMIN D) 1000 UNITS capsule Take 1,000 Units by mouth daily.    Marland Kitchen donepezil (ARICEPT) 23 MG TABS tablet Take 1 tablet (23 mg total) by mouth daily.  . dorzolamide-timolol (COSOPT) 22.3-6.8 MG/ML ophthalmic solution Place 1 drop into the left eye 2 (two) times daily.   . Flaxseed, Linseed, (FLAXSEED OIL) 1000 MG CAPS Take 1,000 mg by mouth  daily.    . folic acid (FOLVITE) 425 MCG tablet Take 800 mcg by mouth every evening.   . furosemide (LASIX) 20 MG tablet Take 1 tablet (20 mg total) by mouth daily.  . hydrocortisone (PROCTOSOL HC) 2.5 % rectal cream Place 1 application rectally at bedtime as needed for hemorrhoids or anal itching.  Marland Kitchen lisinopril (PRINIVIL,ZESTRIL) 5 MG tablet Take 1 tablet (5 mg total) by mouth daily.  Marland Kitchen loperamide (IMODIUM A-D) 2 MG tablet Take 2 mg by mouth 4 (four) times daily as needed for diarrhea or loose stools.   . memantine (NAMENDA XR)  28 MG CP24 24 hr capsule Take 1 capsule (28 mg total) by mouth daily.  . metFORMIN (GLUCOPHAGE) 500 MG tablet Take 1 tablet (500 mg total) by mouth daily with breakfast.  . Multiple Vitamin (MULTIVITAMIN) tablet Take 1 tablet by mouth daily.    Marland Kitchen nystatin cream (MYCOSTATIN) Apply 1 application topically 2 (two) times daily as needed for dry skin.  . Probiotic Product (ALIGN) 4 MG CAPS Take 4 mg by mouth daily.  . simvastatin (ZOCOR) 10 MG tablet Take 5 mg by mouth daily at 6 PM.   . ZINC OXIDE EX Apply 1 application topically 3 (three) times daily as needed.   . Emollient (CERAVE) CREA Apply 1 application topically daily as needed. Apply to arms, legs and trunk    No facility-administered encounter medications on file as of 09/06/2017.     Review of Systems  Reason unable to perform ROS: additional information provided by facility Nurse   Constitutional: Negative for appetite change, chills, fatigue, fever and unexpected weight change.  Eyes: Positive for visual disturbance. Negative for discharge, redness and itching.       Wears eye glasses   Respiratory: Negative for cough, chest tightness, shortness of breath and wheezing.   Cardiovascular: Positive for leg swelling. Negative for chest pain and palpitations.  Gastrointestinal: Negative for abdominal distention, abdominal pain, constipation, diarrhea, nausea and vomiting.  Endocrine: Negative for cold intolerance, heat intolerance, polydipsia, polyphagia and polyuria.  Musculoskeletal: Positive for gait problem. Negative for arthralgias.  Skin: Negative for color change, pallor and rash.  Neurological: Negative for dizziness, light-headedness and headaches.  Psychiatric/Behavioral: Negative for agitation and sleep disturbance. The patient is not nervous/anxious.     Immunization History  Administered Date(s) Administered  . Influenza-Unspecified 01/10/2017  . PPD Test 06/24/2016   Pertinent  Health Maintenance Due  Topic Date Due    . FOOT EXAM  09/28/2017 (Originally 06/06/1940)  . OPHTHALMOLOGY EXAM  09/28/2017 (Originally 06/06/1940)  . INFLUENZA VACCINE  10/31/2017  . HEMOGLOBIN A1C  02/08/2018  . PNA vac Low Risk Adult  Completed   Fall Risk  12/07/2016 01/19/2016 07/21/2015  Falls in the past year? No Yes Yes  Number falls in past yr: - 1 1  Injury with Fall? - No No  Risk for fall due to : - Impaired balance/gait -  Follow up - Falls prevention discussed Falls prevention discussed    Vitals:   09/06/17 1120  BP: (!) 146/88  Pulse: 67  Resp: 20  Temp: (!) 97.1 F (36.2 C)  SpO2: 96%  Weight: 179 lb 12.8 oz (81.6 kg)  Height: 5\' 11"  (1.803 m)   Body mass index is 25.08 kg/m. Physical Exam  Constitutional: He appears well-developed and well-nourished.  Elderly in no acute distress   HENT:  Head: Normocephalic.  Mouth/Throat: Oropharynx is clear and moist. No oropharyngeal exudate.  Eyes: Pupils are equal, round,  and reactive to light. Conjunctivae are normal. Right eye exhibits no discharge. Left eye exhibits no discharge. No scleral icterus.  Neck: Normal range of motion. No JVD present.  Cardiovascular: Normal rate, regular rhythm and normal heart sounds. Exam reveals no gallop and no friction rub.  No murmur heard. Pulmonary/Chest: Effort normal and breath sounds normal. No respiratory distress. He has no wheezes. He has no rales.  Abdominal: Soft. Bowel sounds are normal. He exhibits no distension and no mass. There is no tenderness. There is no rebound and no guarding.  Lymphadenopathy:    He has no cervical adenopathy.  Neurological: He is alert. Gait abnormal.  Alert to self and person   Skin: Skin is warm and dry. No rash noted. No erythema. No pallor.  Psychiatric: He has a normal mood and affect. His speech is normal and behavior is normal. Judgment and thought content normal. Cognition and memory are impaired.    Labs reviewed: Recent Labs    11/28/16 2315  03/29/17 1240  03/30/17 0337 03/31/17 0602 04/18/17 08/08/17  NA  --    < > 136 139 141 140  140 137  K  --    < > 3.6 3.3* 3.6 3.9  3.9 4.0  CL  --    < > 103 110 112*  --   --   CO2  --    < > 25 22 22   --   --   GLUCOSE  --    < > 173* 127* 109*  --   --   BUN  --    < > 10 11 14  22* 19  CREATININE 0.94   < > 1.09 1.00 1.05 0.9  0.91 0.97  CALCIUM  --    < > 9.0 8.1* 8.3* 8.8 9.5  MG 1.8  --   --   --   --   --   --    < > = values in this interval not displayed.   Recent Labs    03/29/17 1240 03/30/17 0337 04/18/17 08/08/17  AST 25 20 14  14 17   ALT 25 17 14  14 22   ALKPHOS 113 75 84  84 109  BILITOT 0.6 0.8 0.4 0.5  PROT 6.8 5.3* 5.8  --   ALBUMIN 3.8 2.8* 3.4 4.1   Recent Labs    11/28/16 1129  03/29/17 1240 03/30/17 0337 03/31/17 0602 04/18/17 08/08/17  WBC 17.1*   < > 13.9* 6.4 6.7 6.5  6.5 8.6  NEUTROABS 14.9*  --  12.6*  --   --   --   --   HGB 14.0   < > 14.3 11.4* 11.0* 12.1*  12.1 14.3  HCT 40.4   < > 43.0 34.3* 33.7* 35*  35.3 40.3  MCV 94.4   < > 96.2 98.3 98.5  --  93.1  PLT 145*   < > 129* 100* 90* 131*  --    < > = values in this interval not displayed.   Lab Results  Component Value Date   TSH 2.61 08/08/2017   Lab Results  Component Value Date   HGBA1C 8.6 09/05/2017   Lab Results  Component Value Date   CHOL 150 09/05/2017   HDL 49 01/31/2017   LDLCALC 89 09/05/2017   TRIG 113 09/05/2017    Significant Diagnostic Results in last 30 days:  No results found.  Assessment/Plan  Type 2 diabetes mellitus with stage 2 chronic kidney disease, without long-term current  use of insulin Lab Results  Component Value Date   HGBA1C 8.6 09/05/2017   CBG readings in the 180's-200's.increase metformin 500 mg tablet daily to twice daily.continue on lisinopril 5 mg tablet for renal protection.continue on Statin.Off ASA due to high risk for falls. Continue to follow up with Podiatrist for annual foot exam last seen 08/06/2017. Recheck Hgb A1C in 3 months.    Family/ staff Communication: Reviewed plan of care with patient and facility Nurse.  Labs/tests ordered: None   Nyara Capell C Srikar Chiang, NP

## 2017-10-07 ENCOUNTER — Non-Acute Institutional Stay (SKILLED_NURSING_FACILITY): Payer: Medicare Other | Admitting: Family

## 2017-10-07 ENCOUNTER — Encounter: Payer: Self-pay | Admitting: Family

## 2017-10-07 DIAGNOSIS — F0281 Dementia in other diseases classified elsewhere with behavioral disturbance: Secondary | ICD-10-CM | POA: Diagnosis not present

## 2017-10-07 DIAGNOSIS — E785 Hyperlipidemia, unspecified: Secondary | ICD-10-CM | POA: Diagnosis not present

## 2017-10-07 DIAGNOSIS — E1122 Type 2 diabetes mellitus with diabetic chronic kidney disease: Secondary | ICD-10-CM | POA: Diagnosis not present

## 2017-10-07 DIAGNOSIS — G308 Other Alzheimer's disease: Secondary | ICD-10-CM | POA: Diagnosis not present

## 2017-10-07 DIAGNOSIS — E1169 Type 2 diabetes mellitus with other specified complication: Secondary | ICD-10-CM | POA: Diagnosis not present

## 2017-10-07 DIAGNOSIS — N182 Chronic kidney disease, stage 2 (mild): Secondary | ICD-10-CM | POA: Diagnosis not present

## 2017-10-07 DIAGNOSIS — I5042 Chronic combined systolic (congestive) and diastolic (congestive) heart failure: Secondary | ICD-10-CM

## 2017-10-07 NOTE — Progress Notes (Signed)
Location:  [place] Room Number: 19 Place of Service:  SNF (31) Provider: Dinah [name] FNP-C   [name], MD  Patient Care Team: [name], MD as PCP - General (Internal Medicine) [name], [name], NP as Nurse Practitioner (Family Medicine)  Extended Emergency Contact Information Primary Emergency Contact: [name] Address: [place], [place] of [place] Phone: [phone] Mobile Phone: [phone] Relation: Spouse Secondary Emergency Contact: [name] [place] of [place] Phone: [phone] Work Phone: [phone] Relation: Daughter  Code Status:  DNR Goals of care: Advanced Directive information Advanced Directives 10/07/2017  Does Patient Have a Medical Advance Directive? Yes  Type of Paramedic of [place];Living will  Does patient want to make changes to medical advance directive? -  Copy of [place] in Chart? Yes  Would patient like information on creating a medical advance directive? -  Pre-existing out of facility DNR order (yellow form or pink MOST form) -     Chief Complaint  Patient presents with  . Medical Management of Chronic Issues    HPI:  Pt is a 84 y.o. male seen today [place] for medical management of chronic diseases.He has a medical history of CHF,type 2 DM,hyperlipidemai,alzheimer's dementia,depression with anxiety among other conditions.He is seen in his room today.he denies any acute issues during visit though HPI and ROs limited due to cognitive impairment.facility Nurse reports no new concerns.No has had no weight changes.No recent fall episodes.CBG readings reviewed CBG ranging 100's-low 200's.      Past Medical History:  Diagnosis Date  . Abnormality of gait   . Backache, unspecified   . CHF (congestive heart failure) ([place])   . Colon polyps    adenomatous  . Dementia 11/12/2012  . Depression     . Diabetes mellitus without complication ([place])   . Diverticulosis   . Essential and other specified forms of tremor   . Hemorrhoids   . History of bilateral hip replacements 11/12/2012  . Memory loss   . 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 Pain in joint, pelvic region and thigh   . Skin cancer of scalp   . Spinal stenosis, lumbar region, without neurogenic claudication   . Unspecified hereditary and idiopathic peripheral neuropathy    Past Surgical History:  Procedure Laterality Date  . CATARACT EXTRACTION, BILATERAL  2012  . JOINT REPLACEMENT    . RETINAL LASER PROCEDURE  2012   retinal wrinkle  . SKIN CANCER EXCISION    . TOTAL HIP ARTHROPLASTY Left 2000  . TOTAL HIP ARTHROPLASTY (aka REPLACEMENT) Right 2011    Allergies  Allergen Reactions  . Axona [Bacid]     Unknown per MAR  . Gabapentin     Hallucinations    Allergies as of 10/07/2017      Reactions   Axona [bacid]    Unknown per MAR   Gabapentin    Hallucinations      Medication List        Accurate as of 10/07/17  4:41 PM. Always use your most recent med list.          ALIGN 4 MG Caps Take 4 mg by mouth daily.   buPROPion 100 MG 12 hr tablet Commonly known as:  WELLBUTRIN SR Take 100 mg by mouth daily.   carvedilol 3.125 MG tablet Commonly known as:  COREG Take  1 tablet (3.125 mg total) by mouth 2 (two) times daily with a meal.   CERAVE Crea Apply 1 application topically daily as needed. Apply to arms, legs and trunk   donepezil 23 MG Tabs tablet Commonly known as:  ARICEPT Take 1 tablet (23 mg total) by mouth daily.   dorzolamide-timolol 22.3-6.8 MG/ML ophthalmic solution Commonly known as:  COSOPT Place 1 drop into the left eye 2 (two) times daily.   Flaxseed Oil 1000 MG Caps Take 1,000 mg by mouth daily.   folic acid 161 MCG tablet Commonly known as:  FOLVITE Take 800 mcg by mouth every evening.   furosemide 20 MG tablet Commonly known as:   LASIX Take 1 tablet (20 mg total) by mouth daily.   lisinopril 5 MG tablet Commonly known as:  PRINIVIL,ZESTRIL Take 1 tablet (5 mg total) by mouth daily.   loperamide 2 MG tablet Commonly known as:  IMODIUM A-D Take 2 mg by mouth 4 (four) times daily as needed for diarrhea or loose stools.   memantine 28 MG Cp24 24 hr capsule Commonly known as:  NAMENDA XR Take 1 capsule (28 mg total) by mouth daily.   metFORMIN 500 MG tablet Commonly known as:  GLUCOPHAGE Take 500 mg by mouth 2 (two) times daily with a meal.   multivitamin tablet Take 1 tablet by mouth daily.   nystatin cream Commonly known as:  MYCOSTATIN Apply 1 application topically 2 (two) times daily as needed for dry skin.   PROCTOSOL HC 2.5 % rectal cream Generic drug:  hydrocortisone Place 1 application rectally at bedtime as needed for hemorrhoids or anal itching.   simvastatin 10 MG tablet Commonly known as:  ZOCOR Take 5 mg by mouth daily at 6 PM.   Vitamin D 1000 units capsule Take 1,000 Units by mouth daily.   ZINC OXIDE EX Apply 1 application topically 3 (three) times daily as needed.       Review of Systems  Unable to perform ROS: Dementia (additional information provided by facility Nurse )    Immunization History  Administered Date(s) Administered  . Influenza-Unspecified 01/10/2017  . PPD Test 06/24/2016   Pertinent  Health Maintenance Due  Topic Date Due  . FOOT EXAM  06/06/1940  . OPHTHALMOLOGY EXAM  06/06/1940  . INFLUENZA VACCINE  10/31/2017  . HEMOGLOBIN A1C  03/07/2018  . PNA vac Low Risk Adult  Completed   Fall Risk  12/07/2016 01/19/2016 07/21/2015  Falls in the past year? No Yes Yes  Number falls in past yr: - 1 1  Injury with Fall? - No No  Risk for fall due to : - Impaired balance/gait -  Follow up - Falls prevention discussed Falls prevention discussed   Functional Status Survey:    Vitals:   10/07/17 1355  BP: 130/76  Pulse: 84  Resp: 20  Temp: 97.9 F (36.6 C)    SpO2: 97%  Weight: 176 lb 8 oz (80.1 kg)  Height: 5\' 11"  (1.803 m)   Body mass index is 24.62 kg/m. Physical Exam  Constitutional: He appears well-developed and well-nourished.  Elderly in no acute distress   HENT:  Head: Normocephalic.  Right Ear: External ear normal.  Left Ear: External ear normal.  Mouth/Throat: Oropharynx is clear and moist. No oropharyngeal exudate.  Eyes: Pupils are equal, round, and reactive to light. Conjunctivae and EOM are normal. Right eye exhibits no discharge. Left eye exhibits no discharge. No scleral icterus.  Neck: Normal range of motion. No JVD  present. No thyromegaly present.  Cardiovascular: Normal rate, regular rhythm, normal heart sounds and intact distal pulses. Exam reveals no gallop and no friction rub.  No murmur heard. Pulmonary/Chest: Effort normal and breath sounds normal. No stridor. No respiratory distress. He has no wheezes. He has no rales.  Abdominal: Soft. Bowel sounds are normal. He exhibits no distension and no mass. There is no tenderness. There is no rebound and no guarding.  Musculoskeletal: He exhibits no edema, tenderness or deformity.  Moves x 4 extremities.unsteady gait self propels on wheelchair  Lymphadenopathy:    He has no cervical adenopathy.  Neurological: Gait abnormal.  Alert and oriented to self and place.   Skin: Skin is warm and dry. No rash noted. No erythema. No pallor.  Psychiatric: He has a normal mood and affect. His speech is normal and behavior is normal. Judgment and thought content normal. Cognition and memory are impaired.  Nursing note and vitals reviewed.  Labs reviewed: Recent Labs    11/28/16 2315  03/29/17 1240 03/30/17 0337 03/31/17 0602 04/18/17 08/08/17  NA  --    < > 136 139 141 140  140 137  K  --    < > 3.6 3.3* 3.6 3.9  3.9 4.0  CL  --    < > 103 110 112*  --   --   CO2  --    < > 25 22 22   --   --   GLUCOSE  --    < > 173* 127* 109*  --   --   BUN  --    < > 10 11 14  22* 19   CREATININE 0.94   < > 1.09 1.00 1.05 0.9  0.91 0.97  CALCIUM  --    < > 9.0 8.1* 8.3* 8.8 9.5  MG 1.8  --   --   --   --   --   --    < > = values in this interval not displayed.   Recent Labs    03/29/17 1240 03/30/17 0337 04/18/17 08/08/17  AST 25 20 14  14 17   ALT 25 17 14  14 22   ALKPHOS 113 75 84  84 109  BILITOT 0.6 0.8 0.4 0.5  PROT 6.8 5.3* 5.8  --   ALBUMIN 3.8 2.8* 3.4 4.1   Recent Labs    11/28/16 1129  03/29/17 1240 03/30/17 0337 03/31/17 0602 04/18/17 08/08/17  WBC 17.1*   < > 13.9* 6.4 6.7 6.5  6.5 8.6  NEUTROABS 14.9*  --  12.6*  --   --   --   --   HGB 14.0   < > 14.3 11.4* 11.0* 12.1*  12.1 14.3  HCT 40.4   < > 43.0 34.3* 33.7* 35*  35.3 40.3  MCV 94.4   < > 96.2 98.3 98.5  --  93.1  PLT 145*   < > 129* 100* 90* 131*  --    < > = values in this interval not displayed.   Lab Results  Component Value Date   TSH 2.61 08/08/2017   Lab Results  Component Value Date   HGBA1C 8.6 09/05/2017   Lab Results  Component Value Date   CHOL 150 09/05/2017   HDL 49 01/31/2017   LDLCALC 89 09/05/2017   TRIG 113 09/05/2017    Significant Diagnostic Results in last 30 days:  No results found.  Assessment/Plan 1. Chronic combined systolic and diastolic congestive heart failure Stable.continue on furosemide  20 mg tablet daily,coreg 3.125 mg tablet twice daily and lisinopril 5 mg tablet daily.continue to monitor weight.  2. Type 2 diabetes mellitus with stage 2 chronic kidney disease, without long-term current use of insulin  CBG readings reviewed 100's-low 200's.continue on metformin 500 mg tablet twice daily.on statin and ACE inhibitor.continue to monitor Hgb A1C.  3. Hyperlipidemia associated with type 2 diabetes mellitus LDL 86 (09/05/2017).continue simvastatin 5 mg tablet daily.monitor lipid panel.  4. Alzheimer's disease of other onset with behavioral disturbance No new behavioral issues.continue current supportive care.  Family/ staff  Communication: Reviewed plan of care with patient and facility Nurse.  Labs/tests ordered: None   Dinah C [name], NP

## 2017-10-14 ENCOUNTER — Encounter: Payer: Self-pay | Admitting: Adult Health

## 2017-10-14 ENCOUNTER — Ambulatory Visit: Payer: Medicare Other | Admitting: Adult Health

## 2017-10-14 DIAGNOSIS — F0390 Unspecified dementia without behavioral disturbance: Secondary | ICD-10-CM | POA: Diagnosis not present

## 2017-10-14 MED ORDER — MEMANTINE HCL ER 28 MG PO CP24: 28.0000 mg | ORAL_CAPSULE | Freq: Every day | ORAL | 3 refills | Status: DC

## 2017-10-14 MED ORDER — DONEPEZIL HCL 23 MG PO TABS: 23.0000 mg | ORAL_TABLET | Freq: Every day | ORAL | 3 refills | Status: DC

## 2017-10-14 NOTE — Patient Instructions (Signed)
Your Plan:  Continue Aricept and Namenda Memory score is stable If your symptoms worsen or you develop new symptoms please let us know.    Thank you for coming to see us at [facility]. I hope we have been able to provide you high quality care today.  You may receive a patient satisfaction survey over the next few weeks. We would appreciate your feedback and comments so that we may continue to improve ourselves and the health of our patients.  

## 2017-10-14 NOTE — Progress Notes (Addendum)
PATIENT: Jose [name] DOB: 1930/05/04  REASON FOR VISIT: follow up HISTORY FROM: patient  HISTORY OF PRESENT ILLNESS: Today 10/14/17:  Mr. Stroschein is an 84 year old male with a history of memory disturbance.  He returns today for follow-up.  The patient continues on Aricept and Namenda.  Reports that he is tolerating the medications well.  He continues to live at  friend's home.  He does require assistance with ADLs.  He primarily uses a wheelchair.  He is able to use a walker with assistance.  He is completing physical therapy 3 times a week.  Denies any trouble with his sleep.  Reports good appetite.  No change in his mood or behavior.  Patient feels that his memory has remained stable.  He returns today for an evaluation.  HISTORY 04/08/17: He reportsvery little on his own. Daughter reports, he seems stable. He still has good long-term memory she feels. 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 She stays at her parents' house during the week and on the weekend she goes to her own home in [place]. Unfortunately, he had a recent hospitalization secondary to sepsis in the context of a upper respiratory infection. I reviewed the hospital records. He was admitted on 03/29/2017 from his skilled nursing facility and discharged on 04/01/2017. He was placed on a dysphagia diet. Per daughter, he is fairly stable with his memory function.   REVIEW OF SYSTEMS: Out of a complete 14 system review of symptoms, the patient complains only of the following symptoms, and all other reviewed systems are negative.  See HPI  ALLERGIES: Allergies  Allergen Reactions  . Axona [Bacid]     Unknown per MAR  . Gabapentin     Hallucinations    HOME MEDICATIONS: Outpatient Medications Prior to Visit  Medication Sig Dispense Refill  . buPROPion (WELLBUTRIN SR) 100 MG 12 hr tablet Take 100 mg by mouth daily.    . carvedilol (COREG) 3.125 MG tablet Take 1 tablet  (3.125 mg total) by mouth 2 (two) times daily with a meal. 60 tablet 1  . Cholecalciferol (VITAMIN D) 1000 UNITS capsule Take 1,000 Units by mouth daily.      . dorzolamide-timolol (COSOPT) 22.3-6.8 MG/ML ophthalmic solution Place 1 drop into the left eye 2 (two) times daily.     . Emollient (CERAVE) CREA Apply 1 application topically daily as needed. Apply to arms, legs and trunk     . Flaxseed, Linseed, (FLAXSEED OIL) 1000 MG CAPS Take 1,000 mg by mouth daily.      . folic acid (FOLVITE) 956 MCG tablet Take 800 mcg by mouth every evening.     . furosemide (LASIX) 20 MG tablet Take 1 tablet (20 mg total) by mouth daily. 30 tablet 1  . hydrocortisone (PROCTOSOL HC) 2.5 % rectal cream Place 1 application rectally at bedtime as needed for hemorrhoids or anal itching.    Marland Kitchen lisinopril (PRINIVIL,ZESTRIL) 5 MG tablet Take 1 tablet (5 mg total) by mouth daily. 30 tablet 1  . loperamide (IMODIUM A-D) 2 MG tablet Take 2 mg by mouth 4 (four) times daily as needed for diarrhea or loose stools.     . metFORMIN (GLUCOPHAGE) 500 MG tablet Take 500 mg by mouth 2 (two) times daily with a meal.    . Multiple Vitamin (MULTIVITAMIN) tablet Take 1 tablet by mouth daily.      Marland Kitchen nystatin cream (MYCOSTATIN) Apply 1 application topically 2 (  two) times daily as needed for dry skin.    . Probiotic Product (ALIGN) 4 MG CAPS Take 4 mg by mouth daily.    . simvastatin (ZOCOR) 10 MG tablet Take 5 mg by mouth daily at 6 PM.     . ZINC OXIDE EX Apply 1 application topically 3 (three) times daily as needed.     . donepezil (ARICEPT) 23 MG TABS tablet Take 1 tablet (23 mg total) by mouth daily. 90 tablet 3  . memantine (NAMENDA XR) 28 MG CP24 24 hr capsule Take 1 capsule (28 mg total) by mouth daily. 90 capsule 3   No facility-administered medications prior to visit.     PAST MEDICAL HISTORY: Past Medical History:  Diagnosis Date  . Abnormality of gait   . Backache, unspecified   . CHF (congestive heart failure) ([place])   .  Colon polyps    adenomatous  . Dementia 11/12/2012  . Depression   . Diabetes mellitus without complication ([place])   . Diverticulosis   . Essential and other specified forms of tremor   . Hemorrhoids   . History of bilateral hip replacements 11/12/2012  . Memory loss   . 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 Pain in joint, pelvic region and thigh   . Skin cancer of scalp   . Spinal stenosis, lumbar region, without neurogenic claudication   . Unspecified hereditary and idiopathic peripheral neuropathy     PAST SURGICAL HISTORY: Past Surgical History:  Procedure Laterality Date  . CATARACT EXTRACTION, BILATERAL  2012  . JOINT REPLACEMENT    . RETINAL LASER PROCEDURE  2012   retinal wrinkle  . SKIN CANCER EXCISION    . TOTAL HIP ARTHROPLASTY Left 2000  . TOTAL HIP ARTHROPLASTY (aka REPLACEMENT) Right 2011    FAMILY HISTORY: Family History  Problem Relation Age of Onset  . Heart failure Mother     SOCIAL HISTORY: Social History   Socioeconomic History  . Marital status: Married    Spouse name: [name]  . Number of children: 2  . Years of education: Chief Executive Officer  . Highest education level: Not on file  Occupational History  . 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 Financial resource strain: Not on file  . Food insecurity:    Worry: Not on file    Inability: Not on file  . Transportation needs:    Medical: Not on file    Non-medical: Not on file  Tobacco Use  . Smoking status: Former Smoker    Types: Cigarettes  . Smokeless tobacco: Never Used  Substance and Sexual Activity  . Alcohol use: Yes    Alcohol/week: 8.4 oz    Types: 14 Glasses of wine per week    Comment: occas, one glass of wine before dinner,sometimes 1/2 glass  . Drug use: No  . Sexual activity: Never  Lifestyle  . Physical activity:    Days per week: Not on file    Minutes per session: Not on file  . Stress: Not  on file  Relationships  . Social connections:    Talks on phone: Not on file    Gets together: Not on file    Attends religious service: Not on file    Active member of club or organization: Not on file    Attends meetings of clubs or organizations: Not on file    Relationship status: Not on  file  . Intimate partner violence:    Fear of current or ex partner: Not on file    Emotionally abused: Not on file    Physically abused: Not on file    Forced sexual activity: Not on file  Other Topics Concern  . Not on file  Social History Narrative   Patient is right handed and resides in home with wife      PHYSICAL EXAM  Vitals:   10/14/17 1112  BP: 114/77  Pulse: 91   There is no height or weight on file to calculate BMI.  Generalized: Well developed, in no acute distress   Neurological examination  Mentation: Alert oriented to time, place, history taking. Follows all commands speech and language fluent Cranial nerve II-XII: Pupils were equal round reactive to light. Extraocular movements were full, visual field were full on confrontational test. Facial sensation and strength were normal. Uvula tongue midline. Head turning and shoulder shrug  were normal and symmetric. Motor: The motor testing reveals 5 over 5 strength of all 4 extremities. Good symmetric motor tone is noted throughout.  Sensory: Sensory testing is intact to soft touch on all 4 extremities. No evidence of extinction is noted.  Coordination: Cerebellar testing reveals good finger-nose-finger and heel-to-shin bilaterally.  Gait and station: Patient is in a wheelchair. Reflexes: Deep tendon reflexes are symmetric and normal bilaterally.   DIAGNOSTIC DATA (LABS, IMAGING, TESTING) - I reviewed patient records, labs, notes, testing and imaging myself where available.  Lab Results  Component Value Date   WBC 8.6 08/08/2017   HGB 14.3 08/08/2017   HCT 40.3 08/08/2017   MCV 93.1 08/08/2017   PLT 131 (A) 04/18/2017        Component Value Date/Time   NA 137 08/08/2017   K 4.0 08/08/2017   CL 112 (H) 03/31/2017 0602   CO2 22 03/31/2017 0602   GLUCOSE 109 (H) 03/31/2017 0602   BUN 19 08/08/2017   CREATININE 0.97 08/08/2017   CALCIUM 9.5 08/08/2017   PROT 5.8 04/18/2017   ALBUMIN 4.1 08/08/2017   AST 17 08/08/2017   ALT 22 08/08/2017   ALT 14 04/18/2017   ALKPHOS 109 08/08/2017   BILITOT 0.5 08/08/2017   GFRNONAA 71 08/08/2017   GFRAA >60 03/31/2017 0602   Lab Results  Component Value Date   CHOL 150 09/05/2017   HDL 49 01/31/2017   LDLCALC 89 09/05/2017   TRIG 113 09/05/2017   Lab Results  Component Value Date   HGBA1C 8.6 09/05/2017    Lab Results  Component Value Date   TSH 2.61 08/08/2017      ASSESSMENT AND PLAN 84 y.o. 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 here with :  1.  Memory disturbance  The patient's memory score has remained stable.  He will continue on Aricept and Namenda.  I have advised that if his symptoms worsen or he develops new symptoms he should let us know.  We will continue to monitor his memory.  He will follow-up in 6 months or sooner if needed.  I spent  15 minutes with the patient. 50% of this time was spent discussing his memory score  Ward Givens, MSN, NP-C 10/14/2017, 12:58 PM [facility] [address], [place], [place] [address] [phone]  I reviewed the above note and documentation by the Nurse Practitioner and agree with the history, physical exam, assessment and plan as outlined above. I was immediately available for face-to-face consultation. Star Age, MD, PhD [facility])

## 2017-10-21 ENCOUNTER — Encounter: Payer: Self-pay | Admitting: *Deleted

## 2017-10-21 ENCOUNTER — Non-Acute Institutional Stay (SKILLED_NURSING_FACILITY): Payer: Medicare Other | Admitting: Family

## 2017-10-21 ENCOUNTER — Encounter: Payer: Self-pay | Admitting: Family

## 2017-10-21 DIAGNOSIS — R410 Disorientation, unspecified: Secondary | ICD-10-CM | POA: Diagnosis not present

## 2017-10-21 DIAGNOSIS — W19XXXA Unspecified fall, initial encounter: Secondary | ICD-10-CM | POA: Diagnosis not present

## 2017-10-21 DIAGNOSIS — S61411A Laceration without foreign body of right hand, initial encounter: Secondary | ICD-10-CM

## 2017-10-21 LAB — CBC AND DIFFERENTIAL
HCT: 39 — AB (ref 41–53)
Hemoglobin: 13.2 — AB (ref 13.5–17.5)
Platelets: 180 (ref 150–399)
WBC: 10.2

## 2017-10-21 LAB — CBC
HCT: 38.8
HGB: 13.2
WBC: 10.2
platelet count: 180

## 2017-10-21 LAB — BASIC METABOLIC PANEL
BUN: 21 (ref 4–21)
CREATININE: 1.21
Calcium: 9.4
Creatinine: 1.2 (ref 0.6–1.3)
GLUCOSE: 263
GLUCOSE: 263
POTASSIUM: 3.9
Potassium: 3.9 (ref 3.4–5.3)
SODIUM: 139 (ref 137–147)
Sodium: 139

## 2017-10-21 NOTE — Progress Notes (Signed)
Location:  [facility] Room Number: 47 Place of Service:  SNF (31) Provider: Treyton Slimp FNP-C  [name], MD  Patient Care Team: [name], MD as PCP - General (Internal Medicine) Julian Askin, [name], NP as Nurse Practitioner (Family Medicine)  Extended Emergency Contact Information Primary Emergency Contact: [name] Address: [place], [place] Phone: [phone] Mobile Phone: [phone] Relation: Spouse Secondary Emergency Contact: [name] [place] Phone: [phone] Work Phone: [phone] Relation: Daughter  Code Status:  DNR Goals of care: Advanced Directive information Advanced Directives 10/21/2017  Does Patient Have a Medical Advance Directive? Yes  Type of Paramedic of [place];Living will  Does patient want to make changes to medical advance directive? -  Copy of [place] in Chart? Yes  Would patient like information on creating a medical advance directive? -  Pre-existing out of facility DNR order (yellow form or pink MOST form) -     Chief Complaint  Patient presents with  . Acute Visit    fall    HPI:  Pt is a 84 y.o. male seen today at [facility] for an acute visit for evaluation of increased confusion and fall encounter.He is seen in his room today with Nurse present at bedside.Nurse reports patient had unwitnessed fall was observed on the floor with face down.Right index finger skin tear was cleansed and steri-strips applied by nurse.No other injuries reported.He denies any fever,chills,cough or signs of urinary tract infections though HPI limited due to dementia.    Past Medical History:  Diagnosis Date  . Abnormality of gait   . Backache, unspecified   . CHF (congestive heart failure) ([place])   . Colon polyps    adenomatous  . Dementia 11/12/2012  . Depression   . Diabetes mellitus  without complication ([place])   . Diverticulosis   . Essential and other specified forms of tremor   . Hemorrhoids   . History of bilateral hip replacements 11/12/2012  . Memory loss   . 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 Pain in joint, pelvic region and thigh   . Skin cancer of scalp   . Spinal stenosis, lumbar region, without neurogenic claudication   . Unspecified hereditary and idiopathic peripheral neuropathy    Past Surgical History:  Procedure Laterality Date  . CATARACT EXTRACTION, BILATERAL  2012  . JOINT REPLACEMENT    . RETINAL LASER PROCEDURE  2012   retinal wrinkle  . SKIN CANCER EXCISION    . TOTAL HIP ARTHROPLASTY Left 2000  . TOTAL HIP ARTHROPLASTY (aka REPLACEMENT) Right 2011    Allergies  Allergen Reactions  . Axona [Bacid]     Unknown per MAR  . Gabapentin     Hallucinations    Outpatient Encounter Medications as of 10/21/2017  Medication Sig  . buPROPion (WELLBUTRIN SR) 100 MG 12 hr tablet Take 100 mg by mouth daily.  . carvedilol (COREG) 3.125 MG tablet Take 1 tablet (3.125 mg total) by mouth 2 (two) times daily with a meal.  . Cholecalciferol (VITAMIN D) 1000 UNITS capsule Take 1,000 Units by mouth daily.    Marland Kitchen donepezil (ARICEPT) 23 MG TABS tablet Take 1 tablet (23 mg total) by mouth daily.  . dorzolamide-timolol (COSOPT) 22.3-6.8 MG/ML ophthalmic solution Place 1 drop into the left eye 2 (two) times daily.   . Emollient (  CERAVE) CREA Apply 1 application topically daily as needed. Apply to arms, legs and trunk   . Flaxseed, Linseed, (FLAXSEED OIL) 1000 MG CAPS Take 1,000 mg by mouth daily.    . folic acid (FOLVITE) 195 MCG tablet Take 800 mcg by mouth every evening.   . furosemide (LASIX) 20 MG tablet Take 1 tablet (20 mg total) by mouth daily.  . hydrocortisone (PROCTOSOL HC) 2.5 % rectal cream Place 1 application rectally at bedtime as needed for hemorrhoids or anal itching.  Marland Kitchen lisinopril (PRINIVIL,ZESTRIL) 5 MG  tablet Take 1 tablet (5 mg total) by mouth daily.  Marland Kitchen loperamide (IMODIUM A-D) 2 MG tablet Take 2 mg by mouth 4 (four) times daily as needed for diarrhea or loose stools.   . memantine (NAMENDA XR) 28 MG CP24 24 hr capsule Take 1 capsule (28 mg total) by mouth daily.  . metFORMIN (GLUCOPHAGE) 500 MG tablet Take 500 mg by mouth 2 (two) times daily with a meal.  . Multiple Vitamin (MULTIVITAMIN) tablet Take 1 tablet by mouth daily.    Marland Kitchen nystatin cream (MYCOSTATIN) Apply 1 application topically 2 (two) times daily as needed for dry skin.  . Probiotic Product (ALIGN) 4 MG CAPS Take 4 mg by mouth daily.  . simvastatin (ZOCOR) 10 MG tablet Take 5 mg by mouth daily at 6 PM.   . ZINC OXIDE EX Apply 1 application topically 3 (three) times daily as needed.    No facility-administered encounter medications on file as of 10/21/2017.     Review of Systems  Constitutional: Negative for appetite change, chills, fatigue, fever and unexpected weight change.  HENT: Negative for congestion, rhinorrhea, sinus pressure, sneezing and sore throat.   Eyes: Positive for visual disturbance. Negative for discharge and redness.       Wears eye glasses   Respiratory: Negative for cough, chest tightness, shortness of breath and wheezing.   Cardiovascular: Positive for leg swelling. Negative for chest pain and palpitations.  Gastrointestinal: Negative for abdominal distention, abdominal pain, constipation, diarrhea, nausea and vomiting.  Genitourinary: Negative for dysuria, flank pain and urgency.  Musculoskeletal: Positive for gait problem.  Skin: Negative for color change, pallor, rash and wound.  Neurological: Negative for dizziness, light-headedness and headaches.  Psychiatric/Behavioral: Positive for confusion. Negative for agitation and sleep disturbance. The patient is not nervous/anxious.     Immunization History  Administered Date(s) Administered  . Influenza-Unspecified 01/10/2017  . PPD Test 06/24/2016    Pertinent  Health Maintenance Due  Topic Date Due  . OPHTHALMOLOGY EXAM  06/06/1940  . INFLUENZA VACCINE  10/31/2017  . HEMOGLOBIN A1C  03/07/2018  . FOOT EXAM  08/07/2018  . PNA vac Low Risk Adult  Completed   Fall Risk  10/14/2017 12/07/2016 01/19/2016 07/21/2015  Falls in the past year? No No Yes Yes  Number falls in past yr: - - 1 1  Injury with Fall? - - No No  Risk for fall due to : Impaired balance/gait - Impaired balance/gait -  Follow up - - Falls prevention discussed Falls prevention discussed   Functional Status Survey:    Vitals:   10/21/17 1153  BP: (!) 137/93  Pulse: 94  Resp: 18  Temp: 97.8 F (36.6 C)  SpO2: 98%  Weight: 176 lb 1.6 oz (79.9 kg)  Height: 5\' 11"  (1.803 m)   Body mass index is 24.56 kg/m. Physical Exam  Constitutional: He appears well-developed and well-nourished.  Elderly in no acute distress   HENT:  Head: Normocephalic.  Mouth/Throat: Oropharynx is clear and moist. No oropharyngeal exudate.  Eyes: Pupils are equal, round, and reactive to light. Conjunctivae and EOM are normal. Right eye exhibits no discharge. Left eye exhibits no discharge. No scleral icterus.  Wears eye glasses   Neck: Normal range of motion. No JVD present. No thyromegaly present.  Cardiovascular: Normal rate, regular rhythm, normal heart sounds and intact distal pulses. Exam reveals no gallop and no friction rub.  No murmur heard. Pulmonary/Chest: Effort normal and breath sounds normal. No respiratory distress. He has no wheezes. He has no rales.  Abdominal: Soft. Bowel sounds are normal. He exhibits no distension and no mass. There is no tenderness. There is no rebound and no guarding.  Musculoskeletal: He exhibits no tenderness.  Moves x 4 extremities.Unsteady gait self propel on wheelchair and ambulates with walker with therapy.bilateral lower extremities trace edema.Knee high ted hose in place.   Lymphadenopathy:    He has no cervical adenopathy.  Neurological:  He is alert. Gait abnormal.  Pleasantly confused   Skin: Skin is warm and dry. No rash noted. No erythema. No pallor.  Psychiatric: He has a normal mood and affect. His speech is normal and behavior is normal. Judgment and thought content normal. He exhibits abnormal recent memory.  Nursing note and vitals reviewed.   Labs reviewed: Recent Labs    11/28/16 2315  03/29/17 1240 03/30/17 0337 03/31/17 0602 04/18/17 08/08/17 10/21/17  NA  --    < > 136 139 141 140  140 137 139  K  --    < > 3.6 3.3* 3.6 3.9  3.9 4.0 3.9  CL  --    < > 103 110 112*  --   --   --   CO2  --    < > 25 22 22   --   --   --   GLUCOSE  --    < > 173* 127* 109*  --   --   --   BUN  --    < > 10 11 14  22* 19 21  CREATININE 0.94   < > 1.09 1.00 1.05 0.9  0.91 0.97 1.21  CALCIUM  --    < > 9.0 8.1* 8.3* 8.8 9.5 9.4  MG 1.8  --   --   --   --   --   --   --    < > = values in this interval not displayed.   Recent Labs    03/29/17 1240 03/30/17 0337 04/18/17 08/08/17  AST 25 20 14  14 17   ALT 25 17 14  14 22   ALKPHOS 113 75 84  84 109  BILITOT 0.6 0.8 0.4 0.5  PROT 6.8 5.3* 5.8  --   ALBUMIN 3.8 2.8* 3.4 4.1   Recent Labs    11/28/16 1129  03/29/17 1240 03/30/17 0337 03/31/17 0602 04/18/17 08/08/17 10/21/17  WBC 17.1*   < > 13.9* 6.4 6.7 6.5  6.5 8.6 10.2  NEUTROABS 14.9*  --  12.6*  --   --   --   --   --   HGB 14.0   < > 14.3 11.4* 11.0* 12.1*  12.1 14.3 13.2  HCT 40.4   < > 43.0 34.3* 33.7* 35*  35.3 40.3 38.8  MCV 94.4   < > 96.2 98.3 98.5  --  93.1  --   PLT 145*   < > 129* 100* 90* 131*  --   --    < > =  values in this interval not displayed.   Lab Results  Component Value Date   TSH 2.61 08/08/2017   Lab Results  Component Value Date   HGBA1C 8.6 09/05/2017   Lab Results  Component Value Date   CHOL 150 09/05/2017   HDL 49 01/31/2017   LDLCALC 89 09/05/2017   TRIG 113 09/05/2017    Significant Diagnostic Results in last 30 days:  No results  found.  Assessment/Plan 1. Confused Afebrile.He has had increased confused per Nursing staff.Pleasantly confused cooperative and follows instruction during visit.check CBC/diff and BMP today to rule out infectious and metabolic etiologies.    2. Fall encounter  Observed on the floor face down.skin Tear to right index finger.No signs of pain or bruises noted.continue fall and safety precautions.continue to monitor. CBC/diff and BMP as above.   3. Skin tear of right index finger  Edges well approximated and steri-strips applied by Nurse.continue to monitor for signs of infections.  Family/ staff Communication: Reviewed plan of care with patient and facility Nurse.   Labs/tests ordered: CBC/diff and BMP today.   Sandrea Hughs, NP

## 2017-10-30 ENCOUNTER — Encounter: Payer: Self-pay | Admitting: Family

## 2017-10-30 ENCOUNTER — Non-Acute Institutional Stay (SKILLED_NURSING_FACILITY): Payer: Medicare Other | Admitting: Family

## 2017-10-30 DIAGNOSIS — R21 Rash and other nonspecific skin eruption: Secondary | ICD-10-CM

## 2017-10-30 NOTE — Progress Notes (Signed)
Location:  [place] Room Number: 9 Place of Service:  SNF (31) Provider: Cynthea Zachman FNP-C  [name], MD  Patient Care Team: [name], MD as PCP - General (Internal Medicine) Ashe Graybeal, [name], NP as Nurse Practitioner (Family Medicine)  Extended Emergency Contact Information Primary Emergency Contact: [name] Address: [place], [place] Phone: [phone] Mobile Phone: [phone] Relation: Spouse Secondary Emergency Contact: [name] [place] of [place] Phone: [phone] Work Phone: [phone] Relation: Daughter  Code Status: Full Code  Goals of care: Advanced Directive information Advanced Directives 10/30/2017  Does Patient Have a Medical Advance Directive? Yes  Type of Paramedic of [place];Living will  Does patient want to make changes to medical advance directive? No - Patient declined  Copy of [place] in Chart? Yes  Would patient like information on creating a medical advance directive? -  Pre-existing out of facility DNR order (yellow form or pink MOST form) -     Chief Complaint  Patient presents with  . Acute Visit    Rash to left thigh and hip     HPI:  Pt is a 84 y.o. male seen today at [facility] for an acute visit for evaluation of left hip and thigh area rash.He is seen in his room today with facility Nurse present at bedside.Nurse reports patient has rash on left hip and thigh area.Patient does not know how long he has had the rash.Patient HPI and ROS limited due to cognitive impairment.He denies any pain,fever,burning sensation or insect bite.   Past Medical History:  Diagnosis Date  . Abnormality of gait   . Backache, unspecified   . CHF (congestive heart failure) ([place])   . Colon polyps    adenomatous  . Dementia 11/12/2012  . Depression   . Diabetes mellitus without complication  ([place])   . Diverticulosis   . Essential and other specified forms of tremor   . Hemorrhoids   . History of bilateral hip replacements 11/12/2012  . Memory loss   . 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 Pain in joint, pelvic region and thigh   . Skin cancer of scalp   . Spinal stenosis, lumbar region, without neurogenic claudication   . Unspecified hereditary and idiopathic peripheral neuropathy    Past Surgical History:  Procedure Laterality Date  . CATARACT EXTRACTION, BILATERAL  2012  . JOINT REPLACEMENT    . RETINAL LASER PROCEDURE  2012   retinal wrinkle  . SKIN CANCER EXCISION    . TOTAL HIP ARTHROPLASTY Left 2000  . TOTAL HIP ARTHROPLASTY (aka REPLACEMENT) Right 2011    Allergies  Allergen Reactions  . Axona [Bacid]     Unknown per MAR  . Gabapentin     Hallucinations    Outpatient Encounter Medications as of 10/30/2017  Medication Sig  . buPROPion (WELLBUTRIN SR) 100 MG 12 hr tablet Take 100 mg by mouth daily.  . carvedilol (COREG) 3.125 MG tablet Take 1 tablet (3.125 mg total) by mouth 2 (two) times daily with a meal.  . Cholecalciferol (VITAMIN D) 1000 UNITS capsule Take 1,000 Units by mouth daily.    Marland Kitchen donepezil (ARICEPT) 23 MG TABS tablet Take 1 tablet (23 mg total) by mouth daily.  . dorzolamide-timolol (COSOPT) 22.3-6.8 MG/ML ophthalmic solution Place 1 drop into the left eye 2 (two) times daily.   Marland Kitchen  Emollient (CERAVE) CREA Apply 1 application topically daily as needed. Apply to arms, legs and trunk   . Flaxseed, Linseed, (FLAXSEED OIL) 1000 MG CAPS Take 1,000 mg by mouth daily.    . folic acid (FOLVITE) 426 MCG tablet Take 800 mcg by mouth every evening.   . furosemide (LASIX) 20 MG tablet Take 1 tablet (20 mg total) by mouth daily.  . hydrocortisone (PROCTOSOL HC) 2.5 % rectal cream Place 1 application rectally at bedtime as needed for hemorrhoids or anal itching.  Marland Kitchen lisinopril (PRINIVIL,ZESTRIL) 5 MG tablet Take 1 tablet  (5 mg total) by mouth daily.  . memantine (NAMENDA XR) 28 MG CP24 24 hr capsule Take 1 capsule (28 mg total) by mouth daily.  . metFORMIN (GLUCOPHAGE) 500 MG tablet Take 500 mg by mouth 2 (two) times daily with a meal.  . Multiple Vitamin (MULTIVITAMIN) tablet Take 1 tablet by mouth daily.    Marland Kitchen nystatin cream (MYCOSTATIN) Apply 1 application topically 2 (two) times daily as needed for dry skin.  . Probiotic Product (ALIGN) 4 MG CAPS Take 4 mg by mouth daily.  . simvastatin (ZOCOR) 10 MG tablet Take 5 mg by mouth daily at 6 PM.   . ZINC OXIDE EX Apply 1 application topically 3 (three) times daily as needed.   . [DISCONTINUED] loperamide (IMODIUM A-D) 2 MG tablet Take 2 mg by mouth 4 (four) times daily as needed for diarrhea or loose stools.    No facility-administered encounter medications on file as of 10/30/2017.     Review of Systems  Unable to perform ROS: Dementia (additional information provided by facility Nurse)  Constitutional: Negative for appetite change, chills, fatigue and fever.  HENT: Negative for congestion, rhinorrhea, sneezing and sore throat.   Eyes: Positive for visual disturbance. Negative for discharge, redness and itching.       Wears eye glasses  Respiratory: Negative for cough, chest tightness, shortness of breath and wheezing.   Cardiovascular: Positive for leg swelling. Negative for chest pain and palpitations.  Gastrointestinal: Negative for abdominal distention, abdominal pain, constipation, diarrhea, nausea and vomiting.  Musculoskeletal: Positive for gait problem. Negative for myalgias.  Skin: Positive for rash. Negative for color change and pallor.       Left hip/thigh rash   Neurological: Negative for dizziness, light-headedness and headaches.  Psychiatric/Behavioral: Positive for confusion. Negative for agitation and sleep disturbance. The patient is not nervous/anxious.     Immunization History  Administered Date(s) Administered  . Influenza-Unspecified  01/10/2017  . PPD Test 06/24/2016   Pertinent  Health Maintenance Due  Topic Date Due  . OPHTHALMOLOGY EXAM  06/06/1940  . INFLUENZA VACCINE  10/31/2017  . HEMOGLOBIN A1C  03/07/2018  . FOOT EXAM  08/07/2018  . PNA vac Low Risk Adult  Completed   Fall Risk  10/14/2017 12/07/2016 01/19/2016 07/21/2015  Falls in the past year? No No Yes Yes  Number falls in past yr: - - 1 1  Injury with Fall? - - No No  Risk for fall due to : Impaired balance/gait - Impaired balance/gait -  Follow up - - Falls prevention discussed Falls prevention discussed   Functional Status Survey:    Vitals:   10/30/17 1136  BP: (!) 153/87  Pulse: 85  Resp: 18  Temp: (!) 97.2 F (36.2 C)  TempSrc: Oral  SpO2: 97%  Weight: 177 lb 12.8 oz (80.6 kg)  Height: 6' (1.829 m)   Body mass index is 24.11 kg/m. Physical Exam  Constitutional:  Elderly in no acute distress   HENT:  Head: Normocephalic.  Mouth/Throat: Oropharynx is clear and moist. No oropharyngeal exudate.  Eyes: Pupils are equal, round, and reactive to light. Conjunctivae are normal. Right eye exhibits no discharge. Left eye exhibits no discharge. No scleral icterus.  Neck: Normal range of motion. No JVD present.  Cardiovascular: Normal rate, regular rhythm, normal heart sounds and intact distal pulses. Exam reveals no gallop and no friction rub.  No murmur heard. Pulmonary/Chest: Effort normal and breath sounds normal. No respiratory distress. He has no wheezes. He has no rales.  Abdominal: Soft. Bowel sounds are normal. He exhibits no distension and no mass. There is no tenderness. There is no rebound and no guarding.  Musculoskeletal: He exhibits no tenderness.  Unsteady gait ambulates with FWW for short distance but spends most time on wheelchair.Bilateral lower extremities knee high ted hose in place.  Lymphadenopathy:    He has no cervical adenopathy.  Neurological: He is alert. Gait abnormal.  Pleasantly confused at his baseline  Skin:  Skin is warm and dry. Capillary refill takes 2 to 3 seconds. No erythema. No pallor.  Left hip/thigh area small scattered red rash without any drainage noted.surrounding skin tissues without any signs of infections.  Psychiatric: He has a normal mood and affect. His speech is normal and behavior is normal. Judgment and thought content normal. Cognition and memory are impaired.  Nursing note and vitals reviewed.   Labs reviewed: Recent Labs    11/28/16 2315  03/29/17 1240 03/30/17 0337 03/31/17 0602  04/18/17 08/08/17 10/21/17  NA  --    < > 136 139 141   < > 140  140 137 139  139  K  --    < > 3.6 3.3* 3.6   < > 3.9  3.9 4.0 3.9  3.9  CL  --    < > 103 110 112*  --   --   --   --   CO2  --    < > 25 22 22   --   --   --   --   GLUCOSE  --    < > 173* 127* 109*  --   --   --   --   BUN  --    < > 10 11 14   --  22* 19 21  CREATININE 0.94   < > 1.09 1.00 1.05   < > 0.9  0.91 0.97 1.2  1.21  CALCIUM  --    < > 9.0 8.1* 8.3*  --  8.8 9.5 9.4  MG 1.8  --   --   --   --   --   --   --   --    < > = values in this interval not displayed.   Recent Labs    03/29/17 1240 03/30/17 0337 04/18/17 08/08/17  AST 25 20 14  14 17   ALT 25 17 14  14 22   ALKPHOS 113 75 84  84 109  BILITOT 0.6 0.8 0.4 0.5  PROT 6.8 5.3* 5.8  --   ALBUMIN 3.8 2.8* 3.4 4.1   Recent Labs    11/28/16 1129  03/29/17 1240 03/30/17 0337 03/31/17 0602  04/18/17 08/08/17 10/21/17  WBC 17.1*   < > 13.9* 6.4 6.7  --  6.5  6.5 8.6 10.2  10.2  NEUTROABS 14.9*  --  12.6*  --   --   --   --   --   --  HGB 14.0   < > 14.3 11.4* 11.0*   < > 12.1*  12.1 14.3 13.2*  13.2  HCT 40.4   < > 43.0 34.3* 33.7*   < > 35*  35.3 40.3 39*  38.8  MCV 94.4   < > 96.2 98.3 98.5  --   --  93.1  --   PLT 145*   < > 129* 100* 90*  --  131*  --  180   < > = values in this interval not displayed.   Lab Results  Component Value Date   TSH 2.61 08/08/2017   Lab Results  Component Value Date   HGBA1C 8.6 09/05/2017   Lab  Results  Component Value Date   CHOL 150 09/05/2017   HDL 49 01/31/2017   LDLCALC 89 09/05/2017   TRIG 113 09/05/2017    Significant Diagnostic Results in last 30 days:  No results found.  Assessment/Plan  Rash and nonspecific skin eruption Afebrile.red small rash without any drainage or signs of infections.Seems to be healing well.Unclear etiology.Rash not worrisome for shingles no vesicles or bullae noted.Patient reports itchy without any pain or burning sensation.Apply Hydrocortisone 1% cream to affected areas twice daily x 7 days.continue to monitor.  Family/ staff Communication: Reviewed plan of care with patient and facility Nurse   Labs/tests ordered: None   [name] Rodgers Likes, NP

## 2017-11-06 ENCOUNTER — Ambulatory Visit: Payer: Medicare Other | Admitting: Podiatry

## 2017-11-06 ENCOUNTER — Non-Acute Institutional Stay (SKILLED_NURSING_FACILITY): Payer: Medicare Other | Admitting: Internal Medicine

## 2017-11-06 ENCOUNTER — Encounter: Payer: Self-pay | Admitting: Podiatry

## 2017-11-06 ENCOUNTER — Encounter: Payer: Self-pay | Admitting: Internal Medicine

## 2017-11-06 DIAGNOSIS — F0281 Dementia in other diseases classified elsewhere with behavioral disturbance: Secondary | ICD-10-CM

## 2017-11-06 DIAGNOSIS — B351 Tinea unguium: Secondary | ICD-10-CM | POA: Diagnosis not present

## 2017-11-06 DIAGNOSIS — E1122 Type 2 diabetes mellitus with diabetic chronic kidney disease: Secondary | ICD-10-CM | POA: Diagnosis not present

## 2017-11-06 DIAGNOSIS — F339 Major depressive disorder, recurrent, unspecified: Secondary | ICD-10-CM | POA: Diagnosis not present

## 2017-11-06 DIAGNOSIS — E1159 Type 2 diabetes mellitus with other circulatory complications: Secondary | ICD-10-CM

## 2017-11-06 DIAGNOSIS — M79673 Pain in unspecified foot: Secondary | ICD-10-CM

## 2017-11-06 DIAGNOSIS — G309 Alzheimer's disease, unspecified: Secondary | ICD-10-CM | POA: Diagnosis not present

## 2017-11-06 DIAGNOSIS — N183 Chronic kidney disease, stage 3 (moderate): Secondary | ICD-10-CM

## 2017-11-06 DIAGNOSIS — L819 Disorder of pigmentation, unspecified: Secondary | ICD-10-CM

## 2017-11-06 DIAGNOSIS — I998 Other disorder of circulatory system: Secondary | ICD-10-CM

## 2017-11-06 DIAGNOSIS — I739 Peripheral vascular disease, unspecified: Secondary | ICD-10-CM

## 2017-11-06 DIAGNOSIS — R2681 Unsteadiness on feet: Secondary | ICD-10-CM

## 2017-11-06 DIAGNOSIS — I5042 Chronic combined systolic (congestive) and diastolic (congestive) heart failure: Secondary | ICD-10-CM

## 2017-11-06 DIAGNOSIS — M79676 Pain in unspecified toe(s): Secondary | ICD-10-CM

## 2017-11-06 DIAGNOSIS — I878 Other specified disorders of veins: Secondary | ICD-10-CM

## 2017-11-06 NOTE — Progress Notes (Signed)
Patient ID: NASIF BOS, male   DOB: May 21, 1930, 84 y.o.   MRN: [id number] Complaint:  Visit Type: Patient returns to my office for continued preventative foot care services. Complaint: Patient states" my nails have grown long and thick and become painful to walk and wear shoes" .  Patient is diabetic. He presents for preventative foot care services.  Podiatric Exam: Vascular: dorsalis pedis and posterior tibial pulses are not  palpable bilateral. Capillary return is diminished. Temperature gradient is diminished. Skin turgor  diminished .  Purplish discoloration feet  B/L Sensorium: Normal Semmes Weinstein monofilament test. Normal tactile sensation bilaterally. Nail Exam: Pt has thick disfigured discolored nails with subungual debris noted bilateral entire nail hallux through fifth toenails Ulcer Exam: There is no evidence of ulcer or pre-ulcerative changes or infection. Orthopedic Exam: Muscle tone and strength are WNL. No limitations in general ROM. No crepitus or effusions noted. Foot type and digits show no abnormalities. Bony prominences are unremarkable. Skin: No Porokeratosis. No infection or ulcers  Diagnosis:  Tinea unguium, Pain in right toe, pain in left toes, PVD  Treatment & Plan Procedures and Treatment: Consent by patient was obtained for treatment procedures. The patient understood the discussion of treatment and procedures well. All questions were answered thoroughly reviewed. Debridement of mycotic and hypertrophic toenails, 1 through 5 bilateral and clearing of subungual debris. No ulceration, no infection noted.  Wife is concerned about his increased purple feet and the fact he refuses to walk some days.  I suggested we obtain vascular studies to document his condition. Return Visit-Office Procedure: Patient instructed to return to the office for a follow up visit 3 months for continued evaluation and treatment.  Gardiner Barefoot DPM

## 2017-11-06 NOTE — Progress Notes (Signed)
Location:  [place] [facility] Room Number: 52 Place of Service:  SNF [phone]) Provider:  [name] MD  [name], MD  Patient Care Team: [name], MD as PCP - General (Internal Medicine) [name], [name], NP as Nurse Practitioner (Family Medicine)  Extended Emergency Contact Information Primary Emergency Contact: [name] Address: [place], [place] of [place] Phone: [phone] Mobile Phone: [phone] Relation: Spouse Secondary Emergency Contact: [name] States of [place] Phone: [phone] Work Phone: [phone] Relation: Daughter  Code Status:  Full code  Goals of care: Advanced Directive information Advanced Directives 11/06/2017  Does Patient Have a Medical Advance Directive? Yes  Type of Paramedic of [place];Living will  Does patient want to make changes to medical advance directive? No - Patient declined  Copy of [place] in Chart? Yes  Would patient like information on creating a medical advance directive? -  Pre-existing out of facility DNR order (yellow form or pink MOST form) -     Chief Complaint  Patient presents with  . Medical Management of Chronic Issues    Routine Visit     HPI:  Pt is a 84 y.o. male seen today for medical management of chronic diseases. He is pleasantly confused. He denies any concern.   Unsteady gait- Recent fall on 10/21/17. No further fall reported. No acute concern from nursing. He is currently working with PT. On wheelchair and needs assistance with transfer  Dementia- He was seen by neurology on 10/14/17 for dementia. On folic acid supplement and donepezil with memantine.   Depression- takes bupropion 100 mg daily  CHF- appears euvolemic. Takes lisinopril 5 mg daily with coreg 3.125 mg bid and lasix 20 mg daily.   Type 2 diabetes with CKD 3- on metformin 500 mg bid with simvastatin 5 mg  daily. Blood sugar reading mainly below 200.     Past Medical History:  Diagnosis Date  . Abnormality of gait   . Backache, unspecified   . CHF (congestive heart failure) ([place])   . Colon polyps    adenomatous  . Dementia 11/12/2012  . Depression   . Diabetes mellitus without complication ([place])   . Diverticulosis   . Essential and other specified forms of tremor   . Hemorrhoids   . History of bilateral hip replacements 11/12/2012  . Memory loss   . 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 Pain in joint, pelvic region and thigh   . Skin cancer of scalp   . Spinal stenosis, lumbar region, without neurogenic claudication   . Unspecified hereditary and idiopathic peripheral neuropathy    Past Surgical History:  Procedure Laterality Date  . CATARACT EXTRACTION, BILATERAL  2012  . JOINT REPLACEMENT    . RETINAL LASER PROCEDURE  2012   retinal wrinkle  . SKIN CANCER EXCISION    . TOTAL HIP ARTHROPLASTY Left 2000  . TOTAL HIP ARTHROPLASTY (aka REPLACEMENT) Right 2011    Allergies  Allergen Reactions  . Axona [Bacid]     Unknown per MAR  . Gabapentin     Hallucinations    Outpatient Encounter Medications as of 11/06/2017  Medication Sig  . buPROPion (WELLBUTRIN SR) 100 MG 12 hr tablet Take 100 mg by mouth daily.  . carvedilol (COREG) 3.125 MG tablet Take 1 tablet (3.125 mg total) by mouth 2 (two) times daily with a  meal.  . Cholecalciferol (VITAMIN D) 1000 UNITS capsule Take 1,000 Units by mouth daily.    Marland Kitchen donepezil (ARICEPT) 23 MG TABS tablet Take 1 tablet (23 mg total) by mouth daily.  . dorzolamide-timolol (COSOPT) 22.3-6.8 MG/ML ophthalmic solution Place 1 drop into the left eye 2 (two) times daily.   . Flaxseed, Linseed, (FLAXSEED OIL) 1000 MG CAPS Take 1,000 mg by mouth daily.    . folic acid (FOLVITE) 027 MCG tablet Take 800 mcg by mouth every evening.   . furosemide (LASIX) 20 MG tablet Take 1 tablet (20 mg total) by mouth daily.  .  hydrocortisone (PROCTOSOL HC) 2.5 % rectal cream Place 1 application rectally at bedtime as needed for hemorrhoids or anal itching.  . hydrocortisone cream 1 % Apply 1 application topically 2 (two) times daily.  Marland Kitchen lisinopril (PRINIVIL,ZESTRIL) 5 MG tablet Take 1 tablet (5 mg total) by mouth daily.  . memantine (NAMENDA XR) 28 MG CP24 24 hr capsule Take 1 capsule (28 mg total) by mouth daily.  . metFORMIN (GLUCOPHAGE) 500 MG tablet Take 500 mg by mouth 2 (two) times daily with a meal.  . Multiple Vitamin (MULTIVITAMIN) tablet Take 1 tablet by mouth daily.    Marland Kitchen nystatin cream (MYCOSTATIN) Apply 1 application topically 2 (two) times daily as needed for dry skin.  . Probiotic Product (ALIGN) 4 MG CAPS Take 4 mg by mouth daily.  . simvastatin (ZOCOR) 10 MG tablet Take 5 mg by mouth daily at 6 PM.   . ZINC OXIDE EX Apply 1 application topically 3 (three) times daily as needed.   . [DISCONTINUED] Emollient (CERAVE) CREA Apply 1 application topically daily as needed. Apply to arms, legs and trunk    No facility-administered encounter medications on file as of 11/06/2017.     Review of Systems  Unable to perform ROS: Dementia (pt and nursing report)  Constitutional: Negative for appetite change, chills and fever.  HENT: Positive for hearing loss. Negative for congestion, mouth sores, rhinorrhea and trouble swallowing.   Eyes: Positive for visual disturbance.  Respiratory: Negative for cough and shortness of breath.   Cardiovascular: Negative for chest pain and palpitations.  Gastrointestinal: Negative for abdominal pain, diarrhea, nausea and vomiting.  Genitourinary: Negative for dysuria.       Has urinary incontinence  Musculoskeletal: Positive for gait problem. Negative for arthralgias and back pain.  Skin: Negative for rash and wound.  Neurological: Negative for dizziness and headaches.  Psychiatric/Behavioral: Positive for behavioral problems and confusion.    Immunization History    Administered Date(s) Administered  . Influenza-Unspecified 01/10/2017  . PPD Test 06/24/2016  . Zoster Recombinat (Shingrix) 06/10/2017, 08/12/2017   Pertinent  Health Maintenance Due  Topic Date Due  . OPHTHALMOLOGY EXAM  06/06/1940  . INFLUENZA VACCINE  10/31/2017  . HEMOGLOBIN A1C  03/07/2018  . FOOT EXAM  08/07/2018  . PNA vac Low Risk Adult  Completed   Fall Risk  10/14/2017 12/07/2016 01/19/2016 07/21/2015  Falls in the past year? No No Yes Yes  Number falls in past yr: - - 1 1  Injury with Fall? - - No No  Risk for fall due to : Impaired balance/gait - Impaired balance/gait -  Follow up - - Falls prevention discussed Falls prevention discussed   Functional Status Survey:    Vitals:   11/06/17 1038  BP: 118/68  Pulse: 83  Resp: 18  Temp: (!) 97.3 F (36.3 C)  TempSrc: Oral  SpO2: 98%  Weight: 178 lb (  80.7 kg)  Height: 6' (1.829 m)   Body mass index is 24.14 kg/m.   Wt Readings from Last 3 Encounters:  11/06/17 178 lb (80.7 kg)  10/30/17 177 lb 12.8 oz (80.6 kg)  10/21/17 176 lb 1.6 oz (79.9 kg)   Physical Exam  Constitutional: He appears well-developed and well-nourished. No distress.  HENT:  Head: Normocephalic and atraumatic.  Nose: Nose normal.  Mouth/Throat: Oropharynx is clear and moist.  Eyes: Pupils are equal, round, and reactive to light. Conjunctivae and EOM are normal. Right eye exhibits no discharge. Left eye exhibits no discharge.  Neck: Normal range of motion. Neck supple.  Cardiovascular: Normal rate and regular rhythm.  Pulmonary/Chest: Effort normal and breath sounds normal. No respiratory distress. He has no wheezes. He has no rales.  Abdominal: Soft. Bowel sounds are normal. There is no tenderness. There is no guarding.  Musculoskeletal: He exhibits edema.  Trace leg edema, can move all 4 extremities, wheelchair present  Lymphadenopathy:    He has no cervical adenopathy.  Neurological: He is alert.  Oriented to self  Skin: Skin is warm  and dry. He is not diaphoretic.  Psychiatric:  Pleasantly confused    Labs reviewed: Recent Labs    11/28/16 2315  03/29/17 1240 03/30/17 0337 03/31/17 0602  04/18/17 08/08/17 10/21/17  NA  --    < > 136 139 141   < > 140  140 137 139  139  K  --    < > 3.6 3.3* 3.6   < > 3.9  3.9 4.0 3.9  3.9  CL  --    < > 103 110 112*  --   --   --   --   CO2  --    < > 25 22 22   --   --   --   --   GLUCOSE  --    < > 173* 127* 109*  --   --   --   --   BUN  --    < > 10 11 14   --  22* 19 21  CREATININE 0.94   < > 1.09 1.00 1.05   < > 0.9  0.91 0.97 1.2  1.21  CALCIUM  --    < > 9.0 8.1* 8.3*  --  8.8 9.5 9.4  MG 1.8  --   --   --   --   --   --   --   --    < > = values in this interval not displayed.   Recent Labs    03/29/17 1240 03/30/17 0337 04/18/17 08/08/17  AST 25 20 14  14 17   ALT 25 17 14  14 22   ALKPHOS 113 75 84  84 109  BILITOT 0.6 0.8 0.4 0.5  PROT 6.8 5.3* 5.8  --   ALBUMIN 3.8 2.8* 3.4 4.1   Recent Labs    11/28/16 1129  03/29/17 1240 03/30/17 0337 03/31/17 0602  04/18/17 08/08/17 10/21/17  WBC 17.1*   < > 13.9* 6.4 6.7  --  6.5  6.5 8.6 10.2  10.2  NEUTROABS 14.9*  --  12.6*  --   --   --   --   --   --   HGB 14.0   < > 14.3 11.4* 11.0*   < > 12.1*  12.1 14.3 13.2*  13.2  HCT 40.4   < > 43.0 34.3* 33.7*   < > 35*  35.3 40.3 39*  38.8  MCV 94.4   < > 96.2 98.3 98.5  --   --  93.1  --   PLT 145*   < > 129* 100* 90*  --  131*  --  180   < > = values in this interval not displayed.   Lab Results  Component Value Date   TSH 2.61 08/08/2017   Lab Results  Component Value Date   HGBA1C 8.6 09/05/2017   Lab Results  Component Value Date   CHOL 150 09/05/2017   HDL 49 01/31/2017   LDLCALC 89 09/05/2017   TRIG 113 09/05/2017    Significant Diagnostic Results in last 30 days:  No results found.  Assessment/Plan  1. Chronic combined systolic and diastolic congestive heart failure (HCC) Continue b blocker, ACEI and furosemide. BMP reviewed.  Has ckd 3, monitor bmp periodically. Continue ted hose.   2. Type 2 diabetes mellitus with stage 3 chronic kidney disease, without long-term current use of insulin (HCC) Lab Results  Component Value Date   HGBA1C 8.6 09/05/2017   Continue metformin 500 mg bid and statin. Continue to monitor blood sugar.   3. Alzheimer's dementia with behavioral disturbance, unspecified timing of dementia onset Continue namenda and donepezil, continue folic acid, supportive care  4. Major depression, recurrent, chronic (HCC) Continue wellbutrin  5. Unsteady gait Wheelchair, assistance with transfer, continue working with PT for 2 weeks. Fall precautions. Remains high fall risk with poor safety awareness.     Family/ staff Communication: reviewed care plan with patient and charge nurse.    Labs/tests ordered:  none   [name], MD Internal Medicine [facility] [address] [place], [place] [address] Cell Phone (Monday-Friday 8 am - 5 pm): [phone] On Call: [phone] and follow prompts after 5 pm and on weekends Office Phone: [phone] Office Fax: [phone]

## 2017-11-07 ENCOUNTER — Other Ambulatory Visit: Payer: Self-pay | Admitting: Podiatry

## 2017-11-07 DIAGNOSIS — R0989 Other specified symptoms and signs involving the circulatory and respiratory systems: Secondary | ICD-10-CM

## 2017-11-07 DIAGNOSIS — I998 Other disorder of circulatory system: Secondary | ICD-10-CM

## 2017-11-07 DIAGNOSIS — M79673 Pain in unspecified foot: Principal | ICD-10-CM

## 2017-11-07 DIAGNOSIS — L819 Disorder of pigmentation, unspecified: Secondary | ICD-10-CM

## 2017-11-13 ENCOUNTER — Encounter: Payer: Self-pay | Admitting: Internal Medicine

## 2017-11-13 ENCOUNTER — Inpatient Hospital Stay (HOSPITAL_COMMUNITY): Admission: RE | Admit: 2017-11-13 | Payer: Self-pay | Source: Ambulatory Visit

## 2017-11-13 ENCOUNTER — Encounter (HOSPITAL_COMMUNITY): Payer: Self-pay

## 2017-11-13 ENCOUNTER — Ambulatory Visit (HOSPITAL_COMMUNITY)
Admission: RE | Admit: 2017-11-13 | Discharge: 2017-11-13 | Disposition: A | Discharge status: Home / Self Care | Payer: Medicare Other | Source: Ambulatory Visit | Attending: Cardiology | Admitting: Cardiology

## 2017-11-13 DIAGNOSIS — M79673 Pain in unspecified foot: Secondary | ICD-10-CM | POA: Diagnosis present

## 2017-11-13 DIAGNOSIS — R0989 Other specified symptoms and signs involving the circulatory and respiratory systems: Secondary | ICD-10-CM

## 2017-11-13 DIAGNOSIS — L819 Disorder of pigmentation, unspecified: Secondary | ICD-10-CM

## 2017-11-13 DIAGNOSIS — I998 Other disorder of circulatory system: Secondary | ICD-10-CM | POA: Insufficient documentation

## 2017-11-14 ENCOUNTER — Inpatient Hospital Stay (HOSPITAL_COMMUNITY): Admission: RE | Admit: 2017-11-14 | Payer: Self-pay | Source: Ambulatory Visit

## 2017-11-14 LAB — BASIC METABOLIC PANEL
BUN: 20 (ref 4–21)
BUN: 20 (ref 4–21)
CALCIUM: 8.5
CREATININE: 0.93
Calcium: 8.5
Creat: 0.93
GLUCOSE: 136
Glucose: 136
POTASSIUM: 4
Potassium: 4
SODIUM: 140
SODIUM: 140

## 2017-11-15 ENCOUNTER — Encounter: Payer: Self-pay | Admitting: *Deleted

## 2017-11-27 NOTE — Progress Notes (Signed)
CARDIOLOGY OFFICE NOTE  Date:  12/04/2017    [name] Date of Birth: 12-Jun-1930 Medical Record #[id number]  PCP:  [name], MD  Cardiologist:  Gillian Shields   No chief complaint on file.   History of Present Illness:   84 y.o.  history of spinal stenosis, arthritis, dementia and DM.  Hospitalized August 2018 with CHF Ech oEF 30-35%  Mild troponin elevation D/c with low dose ACE/Beta blocker as BP soft. Issues with balance and aspiration   Echo 11/02/16 reviewed EF 40-98% grade 2 diastolic Moderate LAE no PA HTN no significant valve disease  Wife indicates biggest issue is balance, and nutrition He is not eating well. Talks gibberish a lot now as dementia has advanced  LE edema improved and no dyspnea Meds dispensed by [facility] so compliant   ABI's done 11/13/17 normal with damped PPG on left  [place] with no DVT bilateral   Still with marginal interactions due to dementia   Past Medical History:  Diagnosis Date  . Abnormality of gait   . Backache, unspecified   . CHF (congestive heart failure) ([place])   . Colon polyps    adenomatous  . Dementia 11/12/2012  . Depression   . Diabetes mellitus without complication ([place])   . Diverticulosis   . Essential and other specified forms of tremor   . Hemorrhoids   . History of bilateral hip replacements 11/12/2012  . Memory loss   . 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 Pain in joint, pelvic region and thigh   . Skin cancer of scalp   . Spinal stenosis, lumbar region, without neurogenic claudication   . Unspecified hereditary and idiopathic peripheral neuropathy     Past Surgical History:  Procedure Laterality Date  . CATARACT EXTRACTION, BILATERAL  2012  . JOINT REPLACEMENT    . RETINAL LASER PROCEDURE  2012   retinal wrinkle  . SKIN CANCER EXCISION    . TOTAL HIP ARTHROPLASTY Left 2000  . TOTAL HIP ARTHROPLASTY (aka REPLACEMENT) Right 2011      Medications: Current Meds  Medication Sig  . buPROPion (WELLBUTRIN SR) 100 MG 12 hr tablet Take 100 mg by mouth daily.  . carvedilol (COREG) 3.125 MG tablet Take 1 tablet (3.125 mg total) by mouth 2 (two) times daily with a meal.  . Cholecalciferol (VITAMIN D) 1000 UNITS capsule Take 1,000 Units by mouth daily.    Marland Kitchen donepezil (ARICEPT) 23 MG TABS tablet Take 1 tablet (23 mg total) by mouth daily.  . dorzolamide-timolol (COSOPT) 22.3-6.8 MG/ML ophthalmic solution Place 1 drop into the left eye 2 (two) times daily.   . Flaxseed, Linseed, (FLAXSEED OIL) 1000 MG CAPS Take 1,000 mg by mouth daily.    . folic acid (FOLVITE) 119 MCG tablet Take 800 mcg by mouth every evening.   . furosemide (LASIX) 20 MG tablet Take 1 tablet (20 mg total) by mouth daily.  . hydrocortisone (PROCTOSOL HC) 2.5 % rectal cream Place 1 application rectally at bedtime as needed for hemorrhoids or anal itching.  . hydrocortisone cream 1 % Apply 1 application topically 2 (two) times daily.  Marland Kitchen lisinopril (PRINIVIL,ZESTRIL) 5 MG tablet Take 1 tablet (5 mg total) by mouth daily.  . memantine (NAMENDA XR) 28 MG CP24 24 hr capsule Take 1 capsule (28 mg total) by mouth daily.  . metFORMIN (GLUCOPHAGE) 500 MG tablet Take 500 mg by mouth 2 (two) times daily with a meal.  .  Multiple Vitamin (MULTIVITAMIN) tablet Take 1 tablet by mouth daily.    Marland Kitchen nystatin cream (MYCOSTATIN) Apply 1 application topically 2 (two) times daily as needed for dry skin.  . Probiotic Product (ALIGN) 4 MG CAPS Take 4 mg by mouth daily.  . simvastatin (ZOCOR) 10 MG tablet Take 5 mg by mouth daily at 6 PM.   . ZINC OXIDE EX Apply 1 application topically 3 (three) times daily as needed.      Allergies: Allergies  Allergen Reactions  . Axona [Bacid]     Unknown per MAR  . Gabapentin     Hallucinations    Social History: The patient  reports that he has quit smoking. His smoking use included cigarettes. He has never used smokeless tobacco. He  reports that he drinks about 14.0 standard drinks of alcohol per week. He reports that he does not use drugs.   Family History: The patient's family history includes Heart failure in his mother.   Review of Systems: Please see the history of present illness.   Otherwise, the review of systems is positive for none.   All other systems are reviewed and negative.   Physical Exam: VS:  BP 94/62   Pulse 87   Ht 6' (1.829 m)   SpO2 99%   BMI 24.14 kg/m  .  BMI Body mass index is 24.14 kg/m.  Wt Readings from Last 3 Encounters:  11/06/17 178 lb (80.7 kg)  10/30/17 177 lb 12.8 oz (80.6 kg)  10/21/17 176 lb 1.6 oz (79.9 kg)    Affect appropriate Demented elderly male  HEENT: normal Neck supple with no adenopathy JVP normal no bruits no thyromegaly Lungs clear with no wheezing and good diaphragmatic motion Heart:  S1/S2 no murmur, no rub, gallop or click PMI normal Abdomen: benighn, BS positve, no tenderness, no AAA no bruit.  No HSM or HJR Distal pulses intact with no bruits Trace bilateral  edema Neuro non-focal but significant dementia Skin warm and dry No muscular weakness    LABORATORY DATA:  EKG:   03/29/17 ST LAD ICLBBB poor R wave progression   Lab Results  Component Value Date   WBC 10.2 10/21/2017   WBC 10.2 10/21/2017   HGB 13.2 10/21/2017   HGB 13.2 (A) 10/21/2017   HCT 38.8 10/21/2017   HCT 39 (A) 10/21/2017   PLT 180 10/21/2017   GLUCOSE 109 (H) 03/31/2017   CHOL 150 09/05/2017   TRIG 113 09/05/2017   HDL 49 01/31/2017   LDLCALC 89 09/05/2017   ALT 22 08/08/2017   AST 17 08/08/2017   NA 140 11/14/2017   NA 140 11/14/2017   K 4.0 11/14/2017   K 4.0 11/14/2017   CL 112 (H) 03/31/2017   CREATININE 0.93 11/14/2017   CREATININE 0.93 11/14/2017   BUN 20 11/14/2017   BUN 20 11/14/2017   CO2 22 03/31/2017   TSH 2.61 08/08/2017   INR 1.15 03/30/2017   HGBA1C 8.6 09/05/2017     BNP (last 3 results) No results for input(s): BNP in the last 8760  hours.  ProBNP (last 3 results) Recent Labs    12/18/16 1503 03/04/17 1520  PROBNP 1,931* 1,249*     Other Studies Reviewed Today:  Echo Study Conclusions 10/2016  - Left ventricle: The cavity size was normal. There was moderate concentric hypertrophy. Systolic function was moderately to severely reduced. The estimated ejection fraction was in the range of 30% to 35%. Diffuse hypokinesis worse in the inferoseptal myocardium. Features are  consistent with a pseudonormal left ventricular filling pattern, with concomitant abnormal relaxation and increased filling pressure (grade 2 diastolic dysfunction). Doppler parameters are consistent with high ventricular filling pressure. - Aortic valve: Transvalvular velocity was within the normal range. There was no stenosis. There was mild regurgitation. Valve area (Vmax): 1.72 cm^2. - Mitral valve: Mildly calcified annulus. Transvalvular velocity was within the normal range. There was no evidence for stenosis. There was no regurgitation. - Left atrium: The atrium was moderately dilated. - Right ventricle: The cavity size was normal. Wall thickness was normal. Systolic function was normal. - Right atrium: The atrium was moderately to severely dilated. - Tricuspid valve: There was no regurgitation. - Pulmonary arteries: PA peak pressure: 12 mm Hg (S).   Assessment/Plan:  1. Chronic systolic HF - EF is 30 to 35% - managed medically. BP remains soft - he is on just low dose ACE and beta blocker BP too low for entresto Continue conservative Rx given age and co morbidities  2. Dementia: advanced support at [facility]   3. Aspiration - wife says improved. Consider f/u swallow study   4. Elevated troponin - no chest pain given age and dementia observe   5. Balance issue - PT/OT    Jenkins Rouge

## 2017-12-03 LAB — HEMOGLOBIN A1C
A1C: 7.7
Hemoglobin A1C: 7.7

## 2017-12-04 ENCOUNTER — Encounter: Payer: Self-pay | Admitting: Cardiovascular Disease

## 2017-12-04 ENCOUNTER — Encounter: Payer: Self-pay | Admitting: *Deleted

## 2017-12-04 ENCOUNTER — Ambulatory Visit: Payer: Medicare Other | Admitting: Cardiovascular Disease

## 2017-12-04 VITALS — BP 94/62 | HR 87 | Ht 72.0 in

## 2017-12-04 DIAGNOSIS — I5022 Chronic systolic (congestive) heart failure: Secondary | ICD-10-CM | POA: Diagnosis not present

## 2017-12-04 NOTE — Patient Instructions (Signed)

## 2017-12-09 ENCOUNTER — Non-Acute Institutional Stay (SKILLED_NURSING_FACILITY): Payer: Medicare Other | Admitting: Family

## 2017-12-09 ENCOUNTER — Encounter: Payer: Self-pay | Admitting: Family

## 2017-12-09 DIAGNOSIS — I5042 Chronic combined systolic (congestive) and diastolic (congestive) heart failure: Secondary | ICD-10-CM

## 2017-12-09 DIAGNOSIS — E1169 Type 2 diabetes mellitus with other specified complication: Secondary | ICD-10-CM | POA: Diagnosis not present

## 2017-12-09 DIAGNOSIS — E785 Hyperlipidemia, unspecified: Secondary | ICD-10-CM

## 2017-12-09 DIAGNOSIS — N183 Chronic kidney disease, stage 3 unspecified: Secondary | ICD-10-CM

## 2017-12-09 DIAGNOSIS — F0281 Dementia in other diseases classified elsewhere with behavioral disturbance: Secondary | ICD-10-CM

## 2017-12-09 DIAGNOSIS — E1122 Type 2 diabetes mellitus with diabetic chronic kidney disease: Secondary | ICD-10-CM | POA: Diagnosis not present

## 2017-12-09 DIAGNOSIS — G309 Alzheimer's disease, unspecified: Secondary | ICD-10-CM | POA: Diagnosis not present

## 2017-12-09 DIAGNOSIS — F339 Major depressive disorder, recurrent, unspecified: Secondary | ICD-10-CM

## 2017-12-13 ENCOUNTER — Non-Acute Institutional Stay (SKILLED_NURSING_FACILITY): Payer: Medicare Other

## 2017-12-13 DIAGNOSIS — Z0000 Encounter for general adult medical examination without abnormal findings: Secondary | ICD-10-CM | POA: Diagnosis not present

## 2017-12-13 NOTE — Patient Instructions (Signed)
Jose [name] , Thank you for taking time to come for your Medicare Wellness Visit. I appreciate your ongoing commitment to your health goals. Please review the following plan we discussed and let me know if I can assist you in the future.   Screening recommendations/referrals: Colonoscopy excluded, over age 82 Recommended yearly ophthalmology/optometry visit for glaucoma screening and checkup Recommended yearly dental visit for hygiene and checkup  Vaccinations: Influenza vaccine due, will receive at [facility] Pneumococcal vaccine up to date, completed Tdap vaccine up to date Shingles vaccine up to date, completed    Advanced directives: in chart  Conditions/risks identified: none  Next appointment: Dr. Bubba Camp makes rounds  Preventive Care 64 Years and Older, Male Preventive care refers to lifestyle choices and visits with your health care provider that can promote health and wellness. What does preventive care include?  A yearly physical exam. This is also called an annual well check.  Dental exams once or twice a year.  Routine eye exams. Ask your health care provider how often you should have your eyes checked.  Personal lifestyle choices, including:  Daily care of your teeth and gums.  Regular physical activity.  Eating a healthy diet.  Avoiding tobacco and drug use.  Limiting alcohol use.  Practicing safe sex.  Taking low doses of aspirin every day.  Taking vitamin and mineral supplements as recommended by your health care provider. What happens during an annual well check? The services and screenings done by your health care provider during your annual well check will depend on your age, overall health, lifestyle risk factors, and family history of disease. Counseling  Your health care provider may ask you questions about your:  Alcohol use.  Tobacco use.  Drug use.  Emotional well-being.  Home and relationship well-being.  Sexual activity.  Eating  habits.  History of falls.  Memory and ability to understand (cognition).  Work and work Statistician. Screening  You may have the following tests or measurements:  Height, weight, and BMI.  Blood pressure.  Lipid and cholesterol levels. These may be checked every 5 years, or more frequently if you are over 78 years old.  Skin check.  Lung cancer screening. You may have this screening every year starting at age 59 if you have a 30-pack-year history of smoking and currently smoke or have quit within the past 15 years.  Fecal occult blood test (FOBT) of the stool. You may have this test every year starting at age 66.  Flexible sigmoidoscopy or colonoscopy. You may have a sigmoidoscopy every 5 years or a colonoscopy every 10 years starting at age 82.  Prostate cancer screening. Recommendations will vary depending on your family history and other risks.  Hepatitis C blood test.  Hepatitis B blood test.  Sexually transmitted disease (STD) testing.  Diabetes screening. This is done by checking your blood sugar (glucose) after you have not eaten for a while (fasting). You may have this done every 1-3 years.  Abdominal aortic aneurysm (AAA) screening. You may need this if you are a current or former smoker.  Osteoporosis. You may be screened starting at age 75 if you are at high risk. Talk with your health care provider about your test results, treatment options, and if necessary, the need for more tests. Vaccines  Your health care provider may recommend certain vaccines, such as:  Influenza vaccine. This is recommended every year.  Tetanus, diphtheria, and acellular pertussis (Tdap, Td) vaccine. You may need a Td booster every 10  years.  Zoster vaccine. You may need this after age 94.  Pneumococcal 13-valent conjugate (PCV13) vaccine. One dose is recommended after age 76.  Pneumococcal polysaccharide (PPSV23) vaccine. One dose is recommended after age 64. Talk to your health  care provider about which screenings and vaccines you need and how often you need them. This information is not intended to replace advice given to you by your health care provider. Make sure you discuss any questions you have with your health care provider. Document Released: 04/15/2015 Document Revised: 12/07/2015 Document Reviewed: 01/18/2015 Elsevier Interactive Patient Education  2017 [place] Prevention in the Home Falls can cause injuries. They can happen to people of all ages. There are many things you can do to make your home safe and to help prevent falls. What can I do on the outside of my home?  Regularly fix the edges of walkways and driveways and fix any cracks.  Remove anything that might make you trip as you walk through a door, such as a raised step or threshold.  Trim any bushes or trees on the path to your home.  Use bright outdoor lighting.  Clear any walking paths of anything that might make someone trip, such as rocks or tools.  Regularly check to see if handrails are loose or broken. Make sure that both sides of any steps have handrails.  Any raised decks and porches should have guardrails on the edges.  Have any leaves, snow, or ice cleared regularly.  Use sand or salt on walking paths during winter.  Clean up any spills in your garage right away. This includes oil or grease spills. What can I do in the bathroom?  Use night lights.  Install grab bars by the toilet and in the tub and shower. Do not use towel bars as grab bars.  Use non-skid mats or decals in the tub or shower.  If you need to sit down in the shower, use a plastic, non-slip stool.  Keep the floor dry. Clean up any water that spills on the floor as soon as it happens.  Remove soap buildup in the tub or shower regularly.  Attach bath mats securely with double-sided non-slip rug tape.  Do not have throw rugs and other things on the floor that can make you trip. What can I do  in the bedroom?  Use night lights.  Make sure that you have a light by your bed that is easy to reach.  Do not use any sheets or blankets that are too big for your bed. They should not hang down onto the floor.  Have a firm chair that has side arms. You can use this for support while you get dressed.  Do not have throw rugs and other things on the floor that can make you trip. What can I do in the kitchen?  Clean up any spills right away.  Avoid walking on wet floors.  Keep items that you use a lot in easy-to-reach places.  If you need to reach something above you, use a strong step stool that has a grab bar.  Keep electrical cords out of the way.  Do not use floor polish or wax that makes floors slippery. If you must use wax, use non-skid floor wax.  Do not have throw rugs and other things on the floor that can make you trip. What can I do with my stairs?  Do not leave any items on the stairs.  Make sure that  there are handrails on both sides of the stairs and use them. Fix handrails that are broken or loose. Make sure that handrails are as long as the stairways.  Check any carpeting to make sure that it is firmly attached to the stairs. Fix any carpet that is loose or worn.  Avoid having throw rugs at the top or bottom of the stairs. If you do have throw rugs, attach them to the floor with carpet tape.  Make sure that you have a light switch at the top of the stairs and the bottom of the stairs. If you do not have them, ask someone to add them for you. What else can I do to help prevent falls?  Wear shoes that:  Do not have high heels.  Have rubber bottoms.  Are comfortable and fit you well.  Are closed at the toe. Do not wear sandals.  If you use a stepladder:  Make sure that it is fully opened. Do not climb a closed stepladder.  Make sure that both sides of the stepladder are locked into place.  Ask someone to hold it for you, if possible.  Clearly mark  and make sure that you can see:  Any grab bars or handrails.  First and last steps.  Where the edge of each step is.  Use tools that help you move around (mobility aids) if they are needed. These include:  Canes.  Walkers.  Scooters.  Crutches.  Turn on the lights when you go into a dark area. Replace any light bulbs as soon as they burn out.  Set up your furniture so you have a clear path. Avoid moving your furniture around.  If any of your floors are uneven, fix them.  If there are any pets around you, be aware of where they are.  Review your medicines with your doctor. Some medicines can make you feel dizzy. This can increase your chance of falling. Ask your doctor what other things that you can do to help prevent falls. This information is not intended to replace advice given to you by your health care provider. Make sure you discuss any questions you have with your health care provider. Document Released: 01/13/2009 Document Revised: 08/25/2015 Document Reviewed: 04/23/2014 Elsevier Interactive Patient Education  2017 [employer].

## 2017-12-13 NOTE — Progress Notes (Signed)
Subjective:   Jose [name] is a 84 y.o. male who presents for Medicare Annual/Subsequent preventive examination at [place]  Last AWV-12/06/2016    Objective:    Vitals: BP 125/70 (BP Location: Left Arm, Patient Position: Sitting)   Pulse 85   Temp 97.6 F (36.4 C) (Oral)   Ht 6' (1.829 m)   Wt 179 lb (81.2 kg)   BMI 24.28 kg/m   Body mass index is 24.28 kg/m.  Advanced Directives 12/13/2017 12/09/2017 11/06/2017 10/30/2017 10/21/2017 10/07/2017 09/06/2017  Does Patient Have a Medical Advance Directive? Yes Yes Yes Yes Yes Yes Yes  Type of Paramedic of [place];Living will [place];Living will [place];Living will [place];Living will [place];Living will [place];Living will [place];Living will  Does patient want to make changes to medical advance directive? No - Patient declined - No - Patient declined No - Patient declined - - -  Copy of [place] in Chart? Yes Yes Yes Yes Yes Yes Yes  Would patient like information on creating a medical advance directive? - - - - - - -  Pre-existing out of facility DNR order (yellow form or pink MOST form) - - - - - - -    Tobacco Social History   Tobacco Use  Smoking Status Former Smoker  . Types: Cigarettes  Smokeless Tobacco Never Used     Counseling given: Not Answered   Clinical Intake:  Pre-visit preparation completed: No  Pain : No/denies pain     Nutritional Risks: None Diabetes: Yes CBG done?: No Did pt. bring in CBG monitor from home?: No  How often do you need to have someone help you when you read instructions, pamphlets, or other written materials from your doctor or pharmacy?: 3 - Sometimes What is the last grade level you completed in school?: College  Interpreter Needed?: No  Information entered by :: Tyson Dense, RN  Past  Medical History:  Diagnosis Date  . Abnormality of gait   . Backache, unspecified   . CHF (congestive heart failure) ([place])   . Colon polyps    adenomatous  . Dementia 11/12/2012  . Depression   . Diabetes mellitus without complication ([place])   . Diverticulosis   . Essential and other specified forms of tremor   . Hemorrhoids   . History of bilateral hip replacements 11/12/2012  . Memory loss   . 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 Pain in joint, pelvic region and thigh   . Skin cancer of scalp   . Spinal stenosis, lumbar region, without neurogenic claudication   . Unspecified hereditary and idiopathic peripheral neuropathy    Past Surgical History:  Procedure Laterality Date  . CATARACT EXTRACTION, BILATERAL  2012  . JOINT REPLACEMENT    . RETINAL LASER PROCEDURE  2012   retinal wrinkle  . SKIN CANCER EXCISION    . TOTAL HIP ARTHROPLASTY Left 2000  . TOTAL HIP ARTHROPLASTY (aka REPLACEMENT) Right 2011   Family History  Problem Relation Age of Onset  . Heart failure Mother    Social History   Socioeconomic History  . Marital status: Married    Spouse name: [name]  . Number of children: 2  . Years of education: Chief Executive Officer  . Highest education level: Not on file  Occupational History  . Occupation: Retired    Fish farm manager:  [employer]    Comment: retired  Scientific laboratory technician  . Financial resource strain: Not hard at all  . Food insecurity:    Worry: Never true    Inability: Never true  . Transportation needs:    Medical: No    Non-medical: No  Tobacco Use  . Smoking status: Former Smoker    Types: Cigarettes  . Smokeless tobacco: Never Used  Substance and Sexual Activity  . Alcohol use: Yes    Alcohol/week: 14.0 standard drinks    Types: 14 Glasses of wine per week    Comment: occas, one glass of wine before dinner,sometimes 1/2 glass  . Drug use: No  . Sexual activity: Never  Lifestyle  . Physical activity:    Days per week:  0 days    Minutes per session: 0 min  . Stress: Only a little  Relationships  . Social connections:    Talks on phone: Never    Gets together: More than three times a week    Attends religious service: Never    Active member of club or organization: No    Attends meetings of clubs or organizations: Never    Relationship status: Married  Other Topics Concern  . Not on file  Social History Narrative   Patient is right handed and resides in home with wife    Outpatient Encounter Medications as of 12/13/2017  Medication Sig  . buPROPion (WELLBUTRIN SR) 100 MG 12 hr tablet Take 100 mg by mouth daily.  . carvedilol (COREG) 3.125 MG tablet Take 1 tablet (3.125 mg total) by mouth 2 (two) times daily with a meal.  . Cholecalciferol (VITAMIN D) 1000 UNITS capsule Take 1,000 Units by mouth daily.    Marland Kitchen donepezil (ARICEPT) 23 MG TABS tablet Take 1 tablet (23 mg total) by mouth daily.  . dorzolamide-timolol (COSOPT) 22.3-6.8 MG/ML ophthalmic solution Place 1 drop into the left eye 2 (two) times daily.   . Flaxseed, Linseed, (FLAXSEED OIL) 1000 MG CAPS Take 1,000 mg by mouth daily.    . folic acid (FOLVITE) 626 MCG tablet Take 800 mcg by mouth every evening.   . furosemide (LASIX) 20 MG tablet Take 1 tablet (20 mg total) by mouth daily.  . hydrocortisone (PROCTOSOL HC) 2.5 % rectal cream Place 1 application rectally at bedtime as needed for hemorrhoids or anal itching.  . hydrocortisone cream 1 % Apply 1 application topically 2 (two) times daily.  Marland Kitchen lisinopril (PRINIVIL,ZESTRIL) 5 MG tablet Take 1 tablet (5 mg total) by mouth daily.  . memantine (NAMENDA XR) 28 MG CP24 24 hr capsule Take 1 capsule (28 mg total) by mouth daily.  . metFORMIN (GLUCOPHAGE) 500 MG tablet Take 500 mg by mouth 2 (two) times daily with a meal.  . Multiple Vitamin (MULTIVITAMIN) tablet Take 1 tablet by mouth daily.    Marland Kitchen nystatin cream (MYCOSTATIN) Apply 1 application topically 2 (two) times daily as needed for dry skin.  .  Probiotic Product (ALIGN) 4 MG CAPS Take 4 mg by mouth daily.  . simvastatin (ZOCOR) 10 MG tablet Take 5 mg by mouth daily at 6 PM.   . ZINC OXIDE EX Apply 1 application topically 3 (three) times daily as needed.    No facility-administered encounter medications on file as of 12/13/2017.     Activities of Daily Living In your present state of health, do you have any difficulty performing the following activities: 12/13/2017 03/31/2017  Hearing? N N  Vision? N N  Difficulty  concentrating or making decisions? Tempie Donning  Walking or climbing stairs? Y Y  Dressing or bathing? Y Y  Doing errands, shopping? Tempie Donning  Preparing Food and eating ? Y -  Using the Toilet? Y -  In the past six months, have you accidently leaked urine? Y -  Do you have problems with loss of bowel control? Y -  Managing your Medications? Y -  Managing your Finances? Y -  Housekeeping or managing your Housekeeping? Y -  Some recent data might be hidden    Patient Care Team: [name], MD as PCP - General (Internal Medicine) [name], MD as PCP - Cardiology (Cardiology) [name], [name], NP as Nurse Practitioner (Family Medicine)   Assessment:   This is a routine wellness examination for [place].  Exercise Activities and Dietary recommendations Current Exercise Habits: The patient does not participate in regular exercise at present, Exercise limited by: neurologic condition(s);orthopedic condition(s)  Goals   None     Fall Risk Fall Risk  12/13/2017 10/14/2017 12/07/2016 01/19/2016 07/21/2015  Falls in the past year? No No No Yes Yes  Number falls in past yr: - - - 1 1  Injury with Fall? - - - No No  Risk for fall due to : - Impaired balance/gait - Impaired balance/gait -  Follow up - - - Falls prevention discussed Falls prevention discussed   Is the patient's home free of loose throw rugs in walkways, pet beds, electrical cords, etc?   yes      Grab bars in the bathroom? yes      Handrails on the stairs?    yes      Adequate lighting?   yes  Depression Screen PHQ 2/9 Scores 12/13/2017 12/07/2016  PHQ - 2 Score 0 0    Cognitive Function MMSE - Mini Mental State Exam 12/13/2017 10/14/2017 12/07/2016 06/28/2016 01/19/2016  Orientation to time 0 1 0 0 1  Orientation to Place 3 3 3 4 3   Registration 3 3 3 3 3   Attention/ Calculation 0 0 3 2 5   Recall 0 0 0 0 0  Language- name 2 objects 2 2 2 2 2   Language- repeat 1 1 1 1 1   Language- follow 3 step command 3 3 3 3 3   Language- read & follow direction 1 1 1 1 1   Write a sentence 1 1 1 1 1   Copy design 0 1 1 1 1   Total score 14 16 18 18 21         Immunization History  Administered Date(s) Administered  . Influenza-Unspecified 01/10/2017  . PPD Test 06/24/2016  . Zoster Recombinat (Shingrix) 06/10/2017, 08/12/2017    Qualifies for Shingles Vaccine? Up to date, completed  Screening Tests Health Maintenance  Topic Date Due  . OPHTHALMOLOGY EXAM  06/06/1940  . INFLUENZA VACCINE  10/31/2017  . TETANUS/TDAP  09/29/2026 (Originally 12/20/2020)  . HEMOGLOBIN A1C  06/03/2018  . FOOT EXAM  08/07/2018  . PNA vac Low Risk Adult  Completed   Cancer Screenings: Lung: Low Dose CT Chest recommended if Age 88-80 years, 30 pack-year currently smoking OR have quit w/in 15years. Patient does not qualify. Colorectal: up to date  Additional Screenings:  Hepatitis C Screening: declined Flu vaccine due: will receive at [facility]  Diabetic eye exam due, ordered    Plan:    I have personally reviewed and addressed the Medicare Annual Wellness questionnaire and have noted the following in the patient's chart:  A. Medical  and social history B. Use of alcohol, tobacco or illicit drugs  C. Current medications and supplements D. Functional ability and status E.  Nutritional status F.  Physical activity G. Advance directives H. List of other physicians I.  Hospitalizations, surgeries, and ER visits in previous 12 months J.  [place] to include  hearing, vision, cognitive, depression L. Referrals and appointments - none  In addition, I have reviewed and discussed with patient certain preventive protocols, quality metrics, and best practice recommendations. A written personalized care plan for preventive services as well as general preventive health recommendations were provided to patient.  See attached scanned questionnaire for additional information.   Signed,   Tyson Dense, RN Nurse Health Advisor  Patient Concerns: None

## 2017-12-17 NOTE — Progress Notes (Signed)
Location:  [facility] Room Number: 34 Place of Service:  SNF (31) Provider: Landy Mace FNP-C   [name], MD  Patient Care Team: [name], MD as PCP - General (Internal Medicine) [name], MD as PCP - Cardiology (Cardiology) Laurelle Skiver, [name], NP as Nurse Practitioner (Family Medicine)  Extended Emergency Contact Information Primary Emergency Contact: [name] Address: [place], [place] Phone: [phone] Mobile Phone: [phone] Relation: Spouse Secondary Emergency Contact: [name] [place] Phone: [phone] Work Phone: [phone] Relation: Daughter  Code Status:  Full Code  Goals of care: Advanced Directive information Advanced Directives 12/13/2017  Does Patient Have a Medical Advance Directive? Yes  Type of Paramedic of [place];Living will  Does patient want to make changes to medical advance directive? No - Patient declined  Copy of [place] in Chart? Yes  Would patient like information on creating a medical advance directive? -  Pre-existing out of facility DNR order (yellow form or pink MOST form) -     Chief Complaint  Patient presents with  . Medical Management of Chronic Issues    monthly routine visit    HPI:  Pt is a 84 y.o. male seen today [place] for medical management of chronic diseases.He has a medical history of combined systolic and diastolic CHF,Type 2 DM,CKD stage 3,Hyperlipidemia,Depression,Alzheimer's dementia among other conditions.He is seen in his room today with facility Nurse present at bedside.He is pleasantly confused unable to provide HPI and ROS information.Facility Nurse reports no new behavioral issues or fall episodes. His weight has been stable.He continues to require assistance with his ADL's. He was recently seen [facility]  Cardiologist 12/04/2017 no  changes in medication.Last  EF 30-35% patient to continue on ACE and BB then follow up in 1 year. He is due for annual diabetic eye exam to rule out diabetic retinopathy. His CBG log reviewed CBG ranging in the 160's-180's with few readings occasionally in the 200's. No signs of hypo/hypergylcemia.      Past Medical History:  Diagnosis Date  . Abnormality of gait   . Backache, unspecified   . CHF (congestive heart failure) ([place])   . Colon polyps    adenomatous  . Dementia 11/12/2012  . Depression   . Diabetes mellitus without complication ([place])   . Diverticulosis   . Essential and other specified forms of tremor   . Hemorrhoids   . History of bilateral hip replacements 11/12/2012  . Memory loss   . 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 Pain in joint, pelvic region and thigh   . Skin cancer of scalp   . Spinal stenosis, lumbar region, without neurogenic claudication   . Unspecified hereditary and idiopathic peripheral neuropathy    Past Surgical History:  Procedure Laterality Date  . CATARACT EXTRACTION, BILATERAL  2012  . JOINT REPLACEMENT    . RETINAL LASER PROCEDURE  2012   retinal wrinkle  . SKIN CANCER EXCISION    . TOTAL HIP ARTHROPLASTY Left 2000  . TOTAL HIP ARTHROPLASTY (aka REPLACEMENT) Right 2011    Allergies  Allergen Reactions  . Axona [Bacid]     Unknown per MAR  . Gabapentin     Hallucinations    Allergies as of 12/09/2017      Reactions   Axona [bacid]    Unknown  per [facility]   Gabapentin    Hallucinations      Medication List        Accurate as of 12/09/17 11:59 PM. Always use your most recent med list.          ALIGN 4 MG Caps Take 4 mg by mouth daily.   buPROPion 100 MG 12 hr tablet Commonly known as:  WELLBUTRIN SR Take 100 mg by mouth daily.   carvedilol 3.125 MG tablet Commonly known as:  COREG Take 1 tablet (3.125 mg total) by mouth 2 (two) times daily with a meal.   donepezil 23 MG Tabs  tablet Commonly known as:  ARICEPT Take 1 tablet (23 mg total) by mouth daily.   dorzolamide-timolol 22.3-6.8 MG/ML ophthalmic solution Commonly known as:  COSOPT Place 1 drop into the left eye 2 (two) times daily.   Flaxseed Oil 1000 MG Caps Take 1,000 mg by mouth daily.   folic acid 893 MCG tablet Commonly known as:  FOLVITE Take 800 mcg by mouth every evening.   furosemide 20 MG tablet Commonly known as:  LASIX Take 1 tablet (20 mg total) by mouth daily.   hydrocortisone cream 1 % Apply 1 application topically 2 (two) times daily.   lisinopril 5 MG tablet Commonly known as:  PRINIVIL,ZESTRIL Take 1 tablet (5 mg total) by mouth daily.   memantine 28 MG Cp24 24 hr capsule Commonly known as:  NAMENDA XR Take 1 capsule (28 mg total) by mouth daily.   metFORMIN 500 MG tablet Commonly known as:  GLUCOPHAGE Take 500 mg by mouth 2 (two) times daily with a meal.   multivitamin tablet Take 1 tablet by mouth daily.   nystatin cream Commonly known as:  MYCOSTATIN Apply 1 application topically 2 (two) times daily as needed for dry skin.   PROCTOSOL HC 2.5 % rectal cream Generic drug:  hydrocortisone Place 1 application rectally at bedtime as needed for hemorrhoids or anal itching.   simvastatin 10 MG tablet Commonly known as:  ZOCOR Take 5 mg by mouth daily at 6 PM.   Vitamin D 1000 units capsule Take 1,000 Units by mouth daily.   ZINC OXIDE EX Apply 1 application topically 3 (three) times daily as needed.       Review of Systems  Unable to perform ROS: Dementia (additional information provided by facility Nurse )  Constitutional: Negative for appetite change, chills, fatigue, fever and unexpected weight change.  HENT: Negative for congestion, rhinorrhea, sinus pressure, sinus pain, sneezing and sore throat.   Eyes: Positive for visual disturbance. Negative for discharge and redness.       Wears eye glasses   Respiratory: Negative for cough, chest tightness,  shortness of breath and wheezing.   Cardiovascular: Positive for leg swelling. Negative for chest pain and palpitations.  Gastrointestinal: Negative for abdominal distention, abdominal pain, constipation, diarrhea, nausea and vomiting.  Endocrine: Negative for cold intolerance, heat intolerance, polydipsia, polyphagia and polyuria.  Genitourinary: Negative for flank pain, hematuria and urgency.  Musculoskeletal: Positive for gait problem.  Skin: Negative for color change, pallor, rash and wound.  Neurological: Negative for dizziness, light-headedness and headaches.  Hematological: Does not bruise/bleed easily.  Psychiatric/Behavioral: Positive for confusion. Negative for agitation and sleep disturbance. The patient is not nervous/anxious.     Immunization History  Administered Date(s) Administered  . Influenza-Unspecified 01/10/2017  . PPD Test 06/24/2016  . Zoster Recombinat (Shingrix) 06/10/2017, 08/12/2017   Pertinent  Health Maintenance Due  Topic Date Due  .  OPHTHALMOLOGY EXAM  06/06/1940  . INFLUENZA VACCINE  10/31/2017  . HEMOGLOBIN A1C  06/03/2018  . FOOT EXAM  08/07/2018  . PNA vac Low Risk Adult  Completed   Fall Risk  12/13/2017 10/14/2017 12/07/2016 01/19/2016 07/21/2015  Falls in the past year? No No No Yes Yes  Number falls in past yr: - - - 1 1  Injury with Fall? - - - No No  Risk for fall due to : - Impaired balance/gait - Impaired balance/gait -  Follow up - - - Falls prevention discussed Falls prevention discussed   Functional Status Survey:    Vitals:   12/09/17 1123  BP: 125/72  Pulse: 89  Resp: 18  Temp: 97.8 F (36.6 C)  SpO2: 99%  Weight: 179 lb 14.4 oz (81.6 kg)  Height: 6' (1.829 m)   Body mass index is 24.4 kg/m. Physical Exam  Constitutional: He appears well-developed and well-nourished. No distress.  HENT:  Head: Normocephalic.  Right Ear: External ear normal.  Left Ear: External ear normal.  Mouth/Throat: Oropharynx is clear and moist. No  oropharyngeal exudate.  Eyes: Pupils are equal, round, and reactive to light. Conjunctivae are normal. Right eye exhibits no discharge. Left eye exhibits no discharge. No scleral icterus.  Neck: Normal range of motion. No JVD present. No thyromegaly present.  Cardiovascular: Normal rate, regular rhythm and normal heart sounds. Exam reveals no gallop and no friction rub.  No murmur heard. Pulmonary/Chest: Effort normal and breath sounds normal. No respiratory distress. He has no wheezes. He has no rales.  Abdominal: Soft. Bowel sounds are normal. He exhibits no distension and no mass. There is no tenderness. There is no rebound and no guarding.  Genitourinary:  Genitourinary Comments: Incontinent   Musculoskeletal:  Moves x 4 extremities unsteady gait self propels on wheelchair.Bilateral lower extremities 1+ edema.    Lymphadenopathy:    He has no cervical adenopathy.  Neurological: He is alert. Gait abnormal.  Pleasantly confused at his baseline   Skin: Skin is warm and dry. No rash noted. No erythema. No pallor.  Psychiatric: He has a normal mood and affect. His speech is normal and behavior is normal. Judgment and thought content normal. Cognition and memory are impaired.  Nursing note and vitals reviewed.  Labs reviewed: Recent Labs    03/29/17 1240 03/30/17 0337 03/31/17 0602  08/08/17 10/21/17 11/14/17  NA 136 139 141   < > 137 139  139 140  140  K 3.6 3.3* 3.6   < > 4.0 3.9  3.9 4.0  4.0  CL 103 110 112*  --   --   --   --   CO2 25 22 22   --   --   --   --   GLUCOSE 173* 127* 109*  --   --   --   --   BUN 10 11 14    < > 19 21 20  20   CREATININE 1.09 1.00 1.05   < > 0.97 1.2  1.21 0.93  0.93  CALCIUM 9.0 8.1* 8.3*   < > 9.5 9.4 8.5  8.5   < > = values in this interval not displayed.   Recent Labs    03/29/17 1240 03/30/17 0337 04/18/17 08/08/17  AST 25 20 14  14 17   ALT 25 17 14  14 22   ALKPHOS 113 75 84  84 109  BILITOT 0.6 0.8 0.4 0.5  PROT 6.8 5.3* 5.8   --   ALBUMIN 3.8  2.8* 3.4 4.1   Recent Labs    03/29/17 1240 03/30/17 0337 03/31/17 0602  04/18/17 08/08/17 10/21/17  WBC 13.9* 6.4 6.7  --  6.5  6.5 8.6 10.2  10.2  NEUTROABS 12.6*  --   --   --   --   --   --   HGB 14.3 11.4* 11.0*   < > 12.1*  12.1 14.3 13.2*  13.2  HCT 43.0 34.3* 33.7*   < > 35*  35.3 40.3 39*  38.8  MCV 96.2 98.3 98.5  --   --  93.1  --   PLT 129* 100* 90*  --  131*  --  180   < > = values in this interval not displayed.   Lab Results  Component Value Date   TSH 2.61 08/08/2017   Lab Results  Component Value Date   HGBA1C 7.7 12/03/2017   Lab Results  Component Value Date   CHOL 150 09/05/2017   HDL 49 01/31/2017   LDLCALC 89 09/05/2017   TRIG 113 09/05/2017    Significant Diagnostic Results in last 30 days:  No results found.  Assessment/Plan 1. Type 2 diabetes mellitus with stage 3 chronic kidney disease, without long-term current use of insulin (HCC) Lab Results  Component Value Date   HGBA1C 7.7 12/03/2017  CBG stable except few readings in the low 200's.continue on metformin 500 mg tablet twice daily.continue to monitor.On ACE inhibitor for renal protection.On BB and Statin for cardiovascular event prevention.monitor Hgb A1C.seen by Podiatrist 11/06/2017.Refer to Ophthalmology for annual diabetic eye exam rule out diabetic retinopathy.     2. Chronic combined systolic and diastolic congestive heart failure (HCC) Weight stable.bilateral lower extremities 1-2+ edema.continue on furosemide 20 mg tablet and knee high ted hose.Lungs CTA bilaterally.continue on ACE inhibitor and BB  3. Hyperlipidemia associated with type 2 diabetes mellitus ([place]) LDL not at goal for Type 2 DM.continue on heart healthy diet and simvastatin 5 mg tablet daily.monitor lipid panel.    4. Alzheimer's dementia with behavioral disturbance, unspecified timing of dementia onset No new behavioral issues reported.continue on donepezil 23 mg tablet daily,Namenda XR 28 mg  capsule and Wellbutrin SR 100 mg tablet daily.continue supportive care.    5. Major depression, recurrent, chronic (HCC) Mood stable on Wellbutrin SR 100 mg tablet daily.monitor for mood changes.   Family/ staff Communication: Reviewed plan of care with patient and facility Nurse.  Labs/tests ordered: None   Tamryn Popko C Samyria Rudie, NP

## 2017-12-20 ENCOUNTER — Non-Acute Institutional Stay (SKILLED_NURSING_FACILITY): Payer: Medicare Other | Admitting: Family

## 2017-12-20 ENCOUNTER — Encounter: Payer: Self-pay | Admitting: Family

## 2017-12-20 DIAGNOSIS — R2681 Unsteadiness on feet: Secondary | ICD-10-CM

## 2017-12-20 DIAGNOSIS — W19XXXA Unspecified fall, initial encounter: Secondary | ICD-10-CM

## 2017-12-20 NOTE — Progress Notes (Signed)
Location:  [facility] Room Number: 65 Place of Service:  SNF (31) Provider: Rhylie Stehr FNP-C  [name], MD  Patient Care Team: [name], MD as PCP - General (Internal Medicine) [name], MD as PCP - Cardiology (Cardiology) Janye Maynor, [name], NP as Nurse Practitioner (Family Medicine)  Extended Emergency Contact Information Primary Emergency Contact: [name] Address: [place], [place] [address] Phone: [phone] Mobile Phone: [phone] Relation: Spouse Secondary Emergency Contact: [name] [place] of [place] Phone: [phone] Work Phone: [phone] Relation: Daughter  Code Status:  Full Code  Goals of care: Advanced Directive information Advanced Directives 12/13/2017  Does Patient Have a Medical Advance Directive? Yes  Type of Paramedic of [place];Living will  Does patient want to make changes to medical advance directive? No - Patient declined  Copy of [place] in Chart? Yes  Would patient like information on creating a medical advance directive? -  Pre-existing out of facility DNR order (yellow form or pink MOST form) -     Chief Complaint  Patient presents with  . Acute Visit    fall     HPI:  Pt is a 84 y.o. Jose [name] seen today at [facility] for an acute visit for evaluation of fall episode.He is seen in his room with facility Nurse present at bedside.Nurse reported via SBAR patient fell out of his wheelchair during [employer] activity.He sustained right forehead swelling.He denied pain or discomfort.ROM and Neuro check was normal.His blood sugars prior to visit was 257. He denies any acute pain during visit.Right forehead swelling has resolved. Nurse states patient has walked with Therapy this morning. B/p log reviewed readings stable. CBG log reviewed readings ranging in the 150's-190's.    Past Medical  History:  Diagnosis Date  . Abnormality of gait   . Backache, unspecified   . CHF (congestive heart failure) ([place])   . Colon polyps    adenomatous  . Dementia 11/12/2012  . Depression   . Diabetes mellitus without complication ([place])   . Diverticulosis   . Essential and other specified forms of tremor   . Hemorrhoids   . History of bilateral hip replacements 11/12/2012  . Memory loss   . 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 Pain in joint, pelvic region and thigh   . Skin cancer of scalp   . Spinal stenosis, lumbar region, without neurogenic claudication   . Unspecified hereditary and idiopathic peripheral neuropathy    Past Surgical History:  Procedure Laterality Date  . CATARACT EXTRACTION, BILATERAL  2012  . JOINT REPLACEMENT    . RETINAL LASER PROCEDURE  2012   retinal wrinkle  . SKIN CANCER EXCISION    . TOTAL HIP ARTHROPLASTY Left 2000  . TOTAL HIP ARTHROPLASTY (aka REPLACEMENT) Right 2011    Allergies  Allergen Reactions  . Axona [Bacid]     Unknown per MAR  . Gabapentin     Hallucinations    Outpatient Encounter Medications as of 12/20/2017  Medication Sig  . buPROPion (WELLBUTRIN SR) 100 MG 12 hr tablet Take 100 mg by mouth daily.  . carvedilol (COREG) 3.125 MG tablet Take 1 tablet (3.125 mg total) by mouth 2 (two) times daily with a meal.  . Cholecalciferol (VITAMIN D) 1000 UNITS capsule Take 1,000 Units by mouth daily.    Marland Kitchen donepezil (ARICEPT) 23  MG TABS tablet Take 1 tablet (23 mg total) by mouth daily.  . dorzolamide-timolol (COSOPT) 22.3-6.8 MG/ML ophthalmic solution Place 1 drop into the left eye 2 (two) times daily.   . Flaxseed, Linseed, (FLAXSEED OIL) 1000 MG CAPS Take 1,000 mg by mouth daily.    . folic acid (FOLVITE) 409 MCG tablet Take 800 mcg by mouth every evening.   . furosemide (LASIX) 20 MG tablet Take 1 tablet (20 mg total) by mouth daily.  . hydrocortisone (PROCTOSOL HC) 2.5 % rectal cream Place 1  application rectally at bedtime as needed for hemorrhoids or anal itching.  . hydrocortisone cream 1 % Apply 1 application topically 2 (two) times daily.  Marland Kitchen lisinopril (PRINIVIL,ZESTRIL) 5 MG tablet Take 1 tablet (5 mg total) by mouth daily.  . memantine (NAMENDA XR) 28 MG CP24 24 hr capsule Take 1 capsule (28 mg total) by mouth daily.  . metFORMIN (GLUCOPHAGE) 500 MG tablet Take 500 mg by mouth 2 (two) times daily with a meal.  . Multiple Vitamin (MULTIVITAMIN) tablet Take 1 tablet by mouth daily.    Marland Kitchen nystatin cream (MYCOSTATIN) Apply 1 application topically 2 (two) times daily as needed for dry skin.  . Probiotic Product (ALIGN) 4 MG CAPS Take 4 mg by mouth daily.  . simvastatin (ZOCOR) 10 MG tablet Take 5 mg by mouth daily at 6 PM.   . ZINC OXIDE EX Apply 1 application topically 3 (three) times daily as needed.    No facility-administered encounter medications on file as of 12/20/2017.     Review of Systems  Unable to perform ROS: Dementia (additional information provided by facility Nurse )  Constitutional: Negative for appetite change, chills, fatigue, fever and unexpected weight change.  HENT: Negative for congestion, rhinorrhea, sinus pressure, sinus pain, sneezing and sore throat.   Eyes: Positive for visual disturbance. Negative for discharge and redness.       Wears eye glasses   Respiratory: Negative for cough, chest tightness, shortness of breath and wheezing.   Cardiovascular: Positive for leg swelling. Negative for chest pain and palpitations.  Gastrointestinal: Negative for abdominal distention, abdominal pain, constipation, diarrhea, nausea and vomiting.  Endocrine: Negative for polydipsia, polyphagia and polyuria.  Genitourinary: Negative for dysuria, flank pain and urgency.  Musculoskeletal: Positive for gait problem.  Skin: Negative for color change, pallor, rash and wound.  Neurological: Negative for dizziness, light-headedness and headaches.  Psychiatric/Behavioral:  Positive for confusion. Negative for agitation and sleep disturbance. The patient is not nervous/anxious.     Immunization History  Administered Date(s) Administered  . Influenza-Unspecified 01/10/2017  . PPD Test 06/24/2016  . Zoster Recombinat (Shingrix) 06/10/2017, 08/12/2017   Pertinent  Health Maintenance Due  Topic Date Due  . OPHTHALMOLOGY EXAM  06/06/1940  . INFLUENZA VACCINE  10/31/2017  . HEMOGLOBIN A1C  06/03/2018  . FOOT EXAM  08/07/2018  . PNA vac Low Risk Adult  Completed   Fall Risk  12/13/2017 10/14/2017 12/07/2016 01/19/2016 07/21/2015  Falls in the past year? No No No Yes Yes  Number falls in past yr: - - - 1 1  Injury with Fall? - - - No No  Risk for fall due to : - Impaired balance/gait - Impaired balance/gait -  Follow up - - - Falls prevention discussed Falls prevention discussed   Functional Status Survey:    Vitals:   12/20/17 1043  BP: 111/63  Pulse: 86  Resp: 18  Temp: 98.3 F (36.8 C)  SpO2: 96%  Weight: 179 lb  4.8 oz (81.3 kg)  Height: 6' (1.829 m)   Body mass index is 24.32 kg/m. Physical Exam  Constitutional: He appears well-developed and well-nourished. No distress.  Elderly   HENT:  Head: Normocephalic.  Right Ear: External ear normal.  Left Ear: External ear normal.  Mouth/Throat: Oropharynx is clear and moist. No oropharyngeal exudate.  Eyes: Pupils are equal, round, and reactive to light. Conjunctivae are normal. Right eye exhibits no discharge. Left eye exhibits no discharge. No scleral icterus.  Neck: Normal range of motion. No JVD present. No thyromegaly present.  Cardiovascular: Normal rate, regular rhythm, normal heart sounds and intact distal pulses. Exam reveals no gallop and no friction rub.  No murmur heard. Pulmonary/Chest: Effort normal and breath sounds normal. No respiratory distress. He has no wheezes. He has no rales.  Abdominal: Soft. Bowel sounds are normal. He exhibits no distension and no mass. There is no  tenderness. There is no rebound and no guarding.  Musculoskeletal: He exhibits no edema or tenderness.  Unsteady gait self propel on wheelchair and ambulates with walker with assistance.Moves x 4 extremities without any difficulties.  Bilateral lower extremities 1-2+ pitting edema.Knee high Ted hose in place.   Lymphadenopathy:    He has no cervical adenopathy.  Neurological: Gait abnormal.  pleasantly confused at his baseline   Skin: Skin is warm and dry. No rash noted. No pallor.  Left forehead slight erythema noted non tender to touch.no swelling noted.  Psychiatric: He has a normal mood and affect. His speech is normal and behavior is normal. Judgment and thought content normal. Cognition and memory are impaired.  Nursing note and vitals reviewed.  Labs reviewed: Recent Labs    03/29/17 1240 03/30/17 0337 03/31/17 0602  08/08/17 10/21/17 11/14/17  NA 136 139 141   < > 137 139  139 140  140  K 3.6 3.3* 3.6   < > 4.0 3.9  3.9 4.0  4.0  CL 103 110 112*  --   --   --   --   CO2 25 22 22   --   --   --   --   GLUCOSE 173* 127* 109*  --   --   --   --   BUN 10 11 14    < > 19 21 20  20   CREATININE 1.09 1.00 1.05   < > 0.97 1.2  1.21 0.93  0.93  CALCIUM 9.0 8.1* 8.3*   < > 9.5 9.4 8.5  8.5   < > = values in this interval not displayed.   Recent Labs    03/29/17 1240 03/30/17 0337 04/18/17 08/08/17  AST 25 20 14  14 17   ALT 25 17 14  14 22   ALKPHOS 113 75 84  84 109  BILITOT 0.6 0.8 0.4 0.5  PROT 6.8 5.3* 5.8  --   ALBUMIN 3.8 2.8* 3.4 4.1   Recent Labs    03/29/17 1240 03/30/17 0337 03/31/17 0602  04/18/17 08/08/17 10/21/17  WBC 13.9* 6.4 6.7  --  6.5  6.5 8.6 10.2  10.2  NEUTROABS 12.6*  --   --   --   --   --   --   HGB 14.3 11.4* 11.0*   < > 12.1*  12.1 14.3 13.2*  13.2  HCT 43.0 34.3* 33.7*   < > 35*  35.3 40.3 39*  38.8  MCV 96.2 98.3 98.5  --   --  93.1  --   PLT 129* 100*  90*  --  131*  --  180   < > = values in this interval not displayed.    Lab Results  Component Value Date   TSH 2.61 08/08/2017   Lab Results  Component Value Date   HGBA1C 7.7 12/03/2017   Lab Results  Component Value Date   CHOL 150 09/05/2017   HDL 49 01/31/2017   LDLCALC 89 09/05/2017   TRIG 113 09/05/2017    Significant Diagnostic Results in last 30 days:  No results found.  Assessment/Plan 1. Fall, initial encounter Afebrile.Status post fall 12/19/2017 off wheelchair during [employer] activity.Right side of forehead swelling was noted but now resolved.slight erythema to left forehead noted today non tender touch and no swelling.Neuro check stable.Completes ROM without any difficulties or pain.cognition confused at his baseline.B/p and CBG reviewed stable.Place Dycem pad to wheelchair cushion to prevent from sliding off the wheelchair.continue to monitor.   2. Unsteady gait Status post fall off the wheelchair as above. continue to work with Physical Therapy.continue on fall and safety precautions.   Family/ staff Communication: Reviewed plan of care with patient and facility Nurse   Labs/tests ordered: None   [name] Skya Mccullum, NP

## 2018-01-08 ENCOUNTER — Encounter: Payer: Self-pay | Admitting: Family

## 2018-01-08 ENCOUNTER — Non-Acute Institutional Stay (SKILLED_NURSING_FACILITY): Payer: Medicare Other | Admitting: Family

## 2018-01-08 DIAGNOSIS — E785 Hyperlipidemia, unspecified: Secondary | ICD-10-CM

## 2018-01-08 DIAGNOSIS — F418 Other specified anxiety disorders: Secondary | ICD-10-CM

## 2018-01-08 DIAGNOSIS — G309 Alzheimer's disease, unspecified: Secondary | ICD-10-CM

## 2018-01-08 DIAGNOSIS — E1122 Type 2 diabetes mellitus with diabetic chronic kidney disease: Secondary | ICD-10-CM | POA: Diagnosis not present

## 2018-01-08 DIAGNOSIS — E1169 Type 2 diabetes mellitus with other specified complication: Secondary | ICD-10-CM

## 2018-01-08 DIAGNOSIS — N183 Chronic kidney disease, stage 3 unspecified: Secondary | ICD-10-CM

## 2018-01-08 DIAGNOSIS — I5042 Chronic combined systolic (congestive) and diastolic (congestive) heart failure: Secondary | ICD-10-CM

## 2018-01-08 DIAGNOSIS — F0281 Dementia in other diseases classified elsewhere with behavioral disturbance: Secondary | ICD-10-CM

## 2018-01-08 MED ORDER — SITAGLIPTIN PHOSPHATE 25 MG PO TABS: 25.0000 mg | ORAL_TABLET | Freq: Every day | ORAL | Status: DC

## 2018-01-08 NOTE — Progress Notes (Signed)
Location:  [facility] Room Number: 24 Place of Service:  SNF (31) Provider: Dinah [name] FNP-C   [name], MD  Patient Care Team: [name], MD as PCP - General (Internal Medicine) [name], MD as PCP - Cardiology (Cardiology) [name], [name], NP as Nurse Practitioner (Family Medicine)  Extended Emergency Contact Information Primary Emergency Contact: [name] Address: [place], [place] Phone: [phone] Mobile Phone: [phone] Relation: Spouse Secondary Emergency Contact: [name] [place] of [place] Phone: [phone] Work Phone: [phone] Relation: Daughter  Code Status: Full Code  Goals of care: Advanced Directive information Advanced Directives 12/13/2017  Does Patient Have a Medical Advance Directive? Yes  Type of Paramedic of [place];Living will  Does patient want to make changes to medical advance directive? No - Patient declined  Copy of [place] in Chart? Yes  Would patient like information on creating a medical advance directive? -  Pre-existing out of facility DNR order (yellow form or pink MOST form) -     Chief Complaint  Patient presents with  . Medical Management of Chronic Issues    routine visit     HPI:  Pt is a 84 y.o. male seen today [facility] for medical management of chronic diseases.He has a medical history chronic combined systolic and diastolic congestive heart failure,type 2 DM,hyperlipidemia,depression with anxiety,Alzheimer's dementia with behavioral disturbance among other conditions.He is seen in his room today with facility Nurse present at bedside.Facility nurse states patient fell 12/19/2017 forward from wheelchair during activity.No injuries sustained.Dycem placed on wheelchair to prevent sliding.No other fall episode since then.His CBG log reviewed readings in the  170's-200's.He is currently on metformin 500 mg tablet twice daily.No symptoms of hypo/hyperglycemia reported.    Past Medical History:  Diagnosis Date  . Abnormality of gait   . Backache, unspecified   . CHF (congestive heart failure) ([place])   . Colon polyps    adenomatous  . Dementia ([place]) 11/12/2012  . Depression   . Diabetes mellitus without complication ([place])   . Diverticulosis   . Essential and other specified forms of tremor   . Hemorrhoids   . History of bilateral hip replacements 11/12/2012  . Memory loss   . 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 Pain in joint, pelvic region and thigh   . Skin cancer of scalp   . Spinal stenosis, lumbar region, without neurogenic claudication   . Unspecified hereditary and idiopathic peripheral neuropathy    Past Surgical History:  Procedure Laterality Date  . CATARACT EXTRACTION, BILATERAL  2012  . JOINT REPLACEMENT    . RETINAL LASER PROCEDURE  2012   retinal wrinkle  . SKIN CANCER EXCISION    . TOTAL HIP ARTHROPLASTY Left 2000  . TOTAL HIP ARTHROPLASTY (aka REPLACEMENT) Right 2011    Allergies  Allergen Reactions  . Axona [Bacid]     Unknown per MAR  . Gabapentin     Hallucinations    Allergies as of 01/08/2018      Reactions   Axona [bacid]    Unknown per [facility]   Gabapentin    Hallucinations      Medication List        Accurate as of 01/08/18  4:14 PM. Always use your most recent med list.          ALIGN  4 MG Caps Take 4 mg by mouth daily.   buPROPion 100 MG 12 hr tablet Commonly known as:  WELLBUTRIN SR Take 100 mg by mouth daily.   carvedilol 3.125 MG tablet Commonly known as:  COREG Take 1 tablet (3.125 mg total) by mouth 2 (two) times daily with a meal.   donepezil 23 MG Tabs tablet Commonly known as:  ARICEPT Take 1 tablet (23 mg total) by mouth daily.   dorzolamide-timolol 22.3-6.8 MG/ML ophthalmic solution Commonly known as:  COSOPT Place 1 drop into the  left eye 2 (two) times daily.   Flaxseed Oil 1000 MG Caps Take 1,000 mg by mouth daily.   folic acid 474 MCG tablet Commonly known as:  FOLVITE Take 800 mcg by mouth every evening.   furosemide 20 MG tablet Commonly known as:  LASIX Take 1 tablet (20 mg total) by mouth daily.   hydrocortisone cream 1 % Apply 1 application topically 2 (two) times daily.   lisinopril 5 MG tablet Commonly known as:  PRINIVIL,ZESTRIL Take 1 tablet (5 mg total) by mouth daily.   memantine 28 MG Cp24 24 hr capsule Commonly known as:  NAMENDA XR Take 1 capsule (28 mg total) by mouth daily.   metFORMIN 500 MG tablet Commonly known as:  GLUCOPHAGE Take 500 mg by mouth 2 (two) times daily with a meal.   multivitamin tablet Take 1 tablet by mouth daily.   nystatin cream Commonly known as:  MYCOSTATIN Apply 1 application topically 2 (two) times daily as needed for dry skin.   PROCTOSOL HC 2.5 % rectal cream Generic drug:  hydrocortisone Place 1 application rectally at bedtime as needed for hemorrhoids or anal itching.   simvastatin 10 MG tablet Commonly known as:  ZOCOR Take 5 mg by mouth daily at 6 PM.   sitaGLIPtin 25 MG tablet Commonly known as:  JANUVIA Take 1 tablet (25 mg total) by mouth daily.   Vitamin D 1000 units capsule Take 1,000 Units by mouth daily.   ZINC OXIDE EX Apply 1 application topically 3 (three) times daily as needed.       Review of Systems  Unable to perform ROS: Dementia (additional information provided by facility Nurse supervisor)  Constitutional: Negative for appetite change, chills, fatigue, fever and unexpected weight change.  HENT: Negative for congestion, rhinorrhea, sinus pressure, sinus pain, sneezing and sore throat.   Eyes: Positive for visual disturbance. Negative for discharge, redness and itching.       Wears eye glasses  Respiratory: Negative for cough, chest tightness, shortness of breath and wheezing.   Cardiovascular: Positive for leg  swelling. Negative for chest pain and palpitations.  Gastrointestinal: Negative for abdominal distention, abdominal pain, constipation, diarrhea, nausea and vomiting.  Endocrine: Negative for cold intolerance, heat intolerance, polydipsia, polyphagia and polyuria.  Genitourinary: Negative for dysuria, flank pain and urgency.  Musculoskeletal: Positive for gait problem.  Skin: Negative for color change, pallor, rash and wound.  Neurological: Negative for dizziness, light-headedness and headaches.  Hematological: Does not bruise/bleed easily.  Psychiatric/Behavioral: Positive for confusion. Negative for agitation and sleep disturbance. The patient is not nervous/anxious.     Immunization History  Administered Date(s) Administered  . Influenza-Unspecified 01/10/2017  . PPD Test 06/24/2016  . Zoster Recombinat (Shingrix) 06/10/2017, 08/12/2017   Pertinent  Health Maintenance Due  Topic Date Due  . OPHTHALMOLOGY EXAM  06/06/1940  . INFLUENZA VACCINE  10/31/2017  . HEMOGLOBIN A1C  06/03/2018  . FOOT EXAM  08/07/2018  . PNA vac  Low Risk Adult  Completed   Fall Risk  12/13/2017 10/14/2017 12/07/2016 01/19/2016 07/21/2015  Falls in the past year? No No No Yes Yes  Number falls in past yr: - - - 1 1  Injury with Fall? - - - No No  Risk for fall due to : - Impaired balance/gait - Impaired balance/gait -  Follow up - - - Falls prevention discussed Falls prevention discussed    Vitals:   01/08/18 1603  BP: 131/66  Pulse: 72  Resp: 18  Temp: (!) 97.1 F (36.2 C)  SpO2: 95%  Weight: 179 lb 3.2 oz (81.3 kg)  Height: 6' (1.829 m)   Body mass index is 24.3 kg/m. Physical Exam  Constitutional: He appears well-developed and well-nourished. No distress.  HENT:  Head: Normocephalic.  Mouth/Throat: Oropharynx is clear and moist. No oropharyngeal exudate.  Eyes: Pupils are equal, round, and reactive to light. Conjunctivae are normal. Right eye exhibits no discharge. Left eye exhibits no  discharge. No scleral icterus.  Neck: Normal range of motion. No JVD present. No thyromegaly present.  Cardiovascular: Normal rate, regular rhythm, normal heart sounds and intact distal pulses. Exam reveals no gallop and no friction rub.  No murmur heard. Pulmonary/Chest: Effort normal and breath sounds normal. No respiratory distress. He has no wheezes. He has no rales.  Abdominal: Soft. Bowel sounds are normal. He exhibits no distension and no mass. There is no tenderness. There is no rebound and no guarding.  Lymphadenopathy:    He has no cervical adenopathy.  Neurological: Gait abnormal.  Pleasantly confused at his baseline answers most questions appropriately and follows instructions.  Nursing note and vitals reviewed.   Labs reviewed: Recent Labs    03/29/17 1240 03/30/17 0337 03/31/17 0602  08/08/17 10/21/17 11/14/17  NA 136 139 141   < > 137 139  139 140  140  K 3.6 3.3* 3.6   < > 4.0 3.9  3.9 4.0  4.0  CL 103 110 112*  --   --   --   --   CO2 25 22 22   --   --   --   --   GLUCOSE 173* 127* 109*  --   --   --   --   BUN 10 11 14    < > 19 21 20  20   CREATININE 1.09 1.00 1.05   < > 0.97 1.2  1.21 0.93  0.93  CALCIUM 9.0 8.1* 8.3*   < > 9.5 9.4 8.5  8.5   < > = values in this interval not displayed.   Recent Labs    03/29/17 1240 03/30/17 0337 04/18/17 08/08/17  AST 25 20 14  14 17   ALT 25 17 14  14 22   ALKPHOS 113 75 84  84 109  BILITOT 0.6 0.8 0.4 0.5  PROT 6.8 5.3* 5.8  --   ALBUMIN 3.8 2.8* 3.4 4.1   Recent Labs    03/29/17 1240 03/30/17 0337 03/31/17 0602  04/18/17 08/08/17 10/21/17  WBC 13.9* 6.4 6.7  --  6.5  6.5 8.6 10.2  10.2  NEUTROABS 12.6*  --   --   --   --   --   --   HGB 14.3 11.4* 11.0*   < > 12.1*  12.1 14.3 13.2*  13.2  HCT 43.0 34.3* 33.7*   < > 35*  35.3 40.3 39*  38.8  MCV 96.2 98.3 98.5  --   --  93.1  --  PLT 129* 100* 90*  --  131*  --  180   < > = values in this interval not displayed.   Lab Results  Component Value  Date   TSH 2.61 08/08/2017   Lab Results  Component Value Date   HGBA1C 7.7 12/03/2017   Lab Results  Component Value Date   CHOL 150 09/05/2017   HDL 49 01/31/2017   LDLCALC 89 09/05/2017   TRIG 113 09/05/2017    Significant Diagnostic Results in last 30 days:  No results found.  Assessment/Plan 1. Type 2 diabetes mellitus with stage 3 chronic kidney disease, without long-term current use of insulin (HCC) CBG reading mostly in the 200's.continue on metformin 500 mg tablet twice daily.Start on Januvia 25 mg tablet daily.on ACE inhibitor for renal protection and statin,BB for cardiovascular event prevention.upto date with annual foot exam.Awaiting wife to make an Ophthalmology appointment. Continue to monitor Hgb A1C.   2. Chronic combined systolic and diastolic congestive heart failure (HCC) 1-2+ edema to lower extremities.knee high ted hose in place.No abrupt weight gain and bilateral lungs CTA.continue on diuretic,BB and ACE inhibitor.  3. Hyperlipidemia associated with type 2 diabetes mellitus ([place]) Continue on simvastatin 5 mg tablet daily.will check lipid panel periodically.   4. Depression with anxiety Mood stable verbally abusive at times though has improved.continue Wellbutrin 100 mg tablet daily.   5. Alzheimer's dementia with behavioral disturbance, unspecified timing of dementia onset ([place]) Continue on donepezil 23 mg tablet daily and memantine 28 mg capsule.continue to encourage to participate in facility activities.   Family/ staff Communication: Reviewed plan of care with patient and facility Nurse supervisor   Labs/tests ordered: None   Sandrea Hughs, NP

## 2018-01-29 ENCOUNTER — Encounter: Payer: Self-pay | Admitting: Family

## 2018-01-29 ENCOUNTER — Non-Acute Institutional Stay (SKILLED_NURSING_FACILITY): Payer: Medicare Other | Admitting: Family

## 2018-01-29 DIAGNOSIS — K644 Residual hemorrhoidal skin tags: Secondary | ICD-10-CM

## 2018-01-29 NOTE — Progress Notes (Signed)
Location:  [facility] Room Number: 52 Place of Service:  SNF (31) Provider: Jayshawn Colston FNP-C  Payeton Germani, [name], NP  Patient Care Team: Abrianna Sidman, [name], NP as PCP - General (Family Medicine) [name], MD as PCP - Cardiology (Cardiology)  Extended Emergency Contact Information Primary Emergency Contact: [name] Address: [address] [place], [place] Phone: [phone] Mobile Phone: [phone] Relation: Spouse Secondary Emergency Contact: [name] [place] Phone: [phone] Work Phone: [phone] Relation: Daughter  Code Status: Full Code  Goals of care: Advanced Directive information Advanced Directives 12/13/2017  Does Patient Have a Medical Advance Directive? Yes  Type of Paramedic of [place];Living will  Does patient want to make changes to medical advance directive? No - Patient declined  Copy of [place] in Chart? Yes  Would patient like information on creating a medical advance directive? -  Pre-existing out of facility DNR order (yellow form or pink MOST form) -     Chief Complaint  Patient presents with  . Acute Visit    blood on the depend     HPI:  Pt is a 84 y.o. male seen today at [facility] for an acute visit for evaluation of blood on the depend per night shift nurse report.He is seen in his room with facility Nurse and patient's wife present at bedside.Nurse states blood was noted on the depends seems to be from the rectum.patient denies any pain or constipation.HPI limited due to patient cognition.   Past Medical History:  Diagnosis Date  . Abnormality of gait   . Backache, unspecified   . CHF (congestive heart failure) ([place])   . Colon polyps    adenomatous  . Dementia ([place]) 11/12/2012  . Depression   . Diabetes mellitus without complication ([place])   . Diverticulosis   . Essential and  other specified forms of tremor   . Hemorrhoids   . History of bilateral hip replacements 11/12/2012  . Memory loss   . 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 Pain in joint, pelvic region and thigh   . Skin cancer of scalp   . Spinal stenosis, lumbar region, without neurogenic claudication   . Unspecified hereditary and idiopathic peripheral neuropathy    Past Surgical History:  Procedure Laterality Date  . CATARACT EXTRACTION, BILATERAL  2012  . JOINT REPLACEMENT    . RETINAL LASER PROCEDURE  2012   retinal wrinkle  . SKIN CANCER EXCISION    . TOTAL HIP ARTHROPLASTY Left 2000  . TOTAL HIP ARTHROPLASTY (aka REPLACEMENT) Right 2011    Allergies  Allergen Reactions  . Axona [Bacid]     Unknown per MAR  . Gabapentin     Hallucinations    Outpatient Encounter Medications as of 01/29/2018  Medication Sig  . buPROPion (WELLBUTRIN SR) 100 MG 12 hr tablet Take 100 mg by mouth daily.  . carvedilol (COREG) 3.125 MG tablet Take 1 tablet (3.125 mg total) by mouth 2 (two) times daily with a meal.  . Cholecalciferol (VITAMIN D) 1000 UNITS capsule Take 1,000 Units by mouth daily.    Marland Kitchen donepezil (ARICEPT) 23 MG TABS tablet Take 1 tablet (23 mg total) by mouth daily.  . dorzolamide-timolol (COSOPT) 22.3-6.8 MG/ML ophthalmic solution Place 1 drop into the left eye 2 (two) times daily.   . Flaxseed, Linseed, (FLAXSEED  OIL) 1000 MG CAPS Take 1,000 mg by mouth daily.    . folic acid (FOLVITE) 761 MCG tablet Take 800 mcg by mouth every evening.   . furosemide (LASIX) 20 MG tablet Take 1 tablet (20 mg total) by mouth daily.  . hydrocortisone (PROCTOSOL HC) 2.5 % rectal cream Place 1 application rectally at bedtime as needed for hemorrhoids or anal itching.  . hydrocortisone cream 1 % Apply 1 application topically 2 (two) times daily.  Marland Kitchen lisinopril (PRINIVIL,ZESTRIL) 5 MG tablet Take 1 tablet (5 mg total) by mouth daily.  . memantine (NAMENDA XR) 28 MG CP24 24  hr capsule Take 1 capsule (28 mg total) by mouth daily.  . metFORMIN (GLUCOPHAGE) 500 MG tablet Take 500 mg by mouth 2 (two) times daily with a meal.  . Multiple Vitamin (MULTIVITAMIN) tablet Take 1 tablet by mouth daily.    Marland Kitchen nystatin cream (MYCOSTATIN) Apply 1 application topically 2 (two) times daily as needed for dry skin.  . Probiotic Product (ALIGN) 4 MG CAPS Take 4 mg by mouth daily.  . simvastatin (ZOCOR) 10 MG tablet Take 5 mg by mouth daily at 6 PM.   . sitaGLIPtin (JANUVIA) 25 MG tablet Take 1 tablet (25 mg total) by mouth daily.  Marland Kitchen ZINC OXIDE EX Apply 1 application topically 3 (three) times daily as needed.    No facility-administered encounter medications on file as of 01/29/2018.     Review of Systems  Unable to perform ROS: Dementia (additional information provided by facility Nurse and patient's wife.)  Constitutional: Negative for appetite change, chills, fatigue and fever.  Respiratory: Negative for cough, shortness of breath and wheezing.   Cardiovascular: Positive for leg swelling. Negative for chest pain and palpitations.  Gastrointestinal: Negative for abdominal distention, abdominal pain, constipation, diarrhea, nausea, rectal pain and vomiting.       Blood noted on depend per nurse report  Genitourinary:       Incontinent  Musculoskeletal: Positive for gait problem.  Skin: Negative for color change, pallor, rash and wound.  Psychiatric/Behavioral: Positive for confusion. Negative for agitation and sleep disturbance. The patient is not nervous/anxious.     Immunization History  Administered Date(s) Administered  . Influenza-Unspecified 01/10/2017, 01/13/2018  . PPD Test 06/24/2016  . Zoster Recombinat (Shingrix) 06/10/2017, 08/12/2017   Pertinent  Health Maintenance Due  Topic Date Due  . OPHTHALMOLOGY EXAM  06/06/1940  . HEMOGLOBIN A1C  06/03/2018  . FOOT EXAM  08/07/2018  . INFLUENZA VACCINE  Completed  . PNA vac Low Risk Adult  Completed   Fall Risk   12/13/2017 10/14/2017 12/07/2016 01/19/2016 07/21/2015  Falls in the past year? No No No Yes Yes  Number falls in past yr: - - - 1 1  Injury with Fall? - - - No No  Risk for fall due to : - Impaired balance/gait - Impaired balance/gait -  Follow up - - - Falls prevention discussed Falls prevention discussed    Vitals:   01/29/18 1109  BP: 126/66  Pulse: 90  Resp: 20  Temp: 97.7 F (36.5 C)  SpO2: 96%  Weight: 179 lb 6.4 oz (81.4 kg)  Height: 6' (1.829 m)   Body mass index is 24.33 kg/m. Physical Exam  Constitutional:  Tall built,elderly in no acute distress  Cardiovascular: Normal rate, regular rhythm, normal heart sounds and intact distal pulses. Exam reveals no gallop and no friction rub.  No murmur heard. Pulmonary/Chest: Effort normal and breath sounds normal. No respiratory distress. He has  no wheezes. He has no rales.  Abdominal: Soft. Bowel sounds are normal. He exhibits no distension and no mass. There is no tenderness. There is no rebound and no guarding.  Genitourinary:  Genitourinary Comments: external hemorroids noted.No blood on depends during visit.   Musculoskeletal: He exhibits no tenderness.  Unsteady gait ambulates short distance with FWW but most time on wheelchair.chronic bilateral leg trace edema ted hose in place.    Neurological:  Pleasantly confused at his baseline.   Skin: Skin is warm and dry. No erythema. No pallor.  Psychiatric: He has a normal mood and affect. His speech is normal and behavior is normal. Judgment normal. Cognition and memory are impaired.  Nursing note and vitals reviewed.   Labs reviewed: Recent Labs    03/29/17 1240 03/30/17 0337 03/31/17 0602  08/08/17 10/21/17 11/14/17  NA 136 139 141   < > 137 139  139 140  140  K 3.6 3.3* 3.6   < > 4.0 3.9  3.9 4.0  4.0  CL 103 110 112*  --   --   --   --   CO2 25 22 22   --   --   --   --   GLUCOSE 173* 127* 109*  --   --   --   --   BUN 10 11 14    < > 19 21 20  20   CREATININE 1.09  1.00 1.05   < > 0.97 1.2  1.21 0.93  0.93  CALCIUM 9.0 8.1* 8.3*   < > 9.5 9.4 8.5  8.5   < > = values in this interval not displayed.   Recent Labs    03/29/17 1240 03/30/17 0337 04/18/17 08/08/17  AST 25 20 14  14 17   ALT 25 17 14  14 22   ALKPHOS 113 75 84  84 109  BILITOT 0.6 0.8 0.4 0.5  PROT 6.8 5.3* 5.8  --   ALBUMIN 3.8 2.8* 3.4 4.1   Recent Labs    03/29/17 1240 03/30/17 0337 03/31/17 0602  04/18/17 08/08/17 10/21/17  WBC 13.9* 6.4 6.7  --  6.5  6.5 8.6 10.2  10.2  NEUTROABS 12.6*  --   --   --   --   --   --   HGB 14.3 11.4* 11.0*   < > 12.1*  12.1 14.3 13.2*  13.2  HCT 43.0 34.3* 33.7*   < > 35*  35.3 40.3 39*  38.8  MCV 96.2 98.3 98.5  --   --  93.1  --   PLT 129* 100* 90*  --  131*  --  180   < > = values in this interval not displayed.   Lab Results  Component Value Date   TSH 2.61 08/08/2017   Lab Results  Component Value Date   HGBA1C 7.7 12/03/2017   Lab Results  Component Value Date   CHOL 150 09/05/2017   HDL 49 01/31/2017   LDLCALC 89 09/05/2017   TRIG 113 09/05/2017    Significant Diagnostic Results in last 30 days:  No results found.  Assessment/Plan   External hemorrhoid No blood on depend noted.External hemorrhoids noted none tender to touch.change Hydrocortisone 2.5% rectal cream from as needed  to one application twice daily x one week then change to PRN.continue to monitor.Add colace 100 mg tablet one by mouth daily.  Family/ staff Communication: Reviewed plan of care with patient,patient's wife and facility Nurse.  Labs/tests ordered: None   Collen Vincent C  Deundre Thong, NP

## 2018-02-05 ENCOUNTER — Ambulatory Visit: Payer: Medicare Other | Admitting: Podiatry

## 2018-02-05 ENCOUNTER — Encounter: Payer: Self-pay | Admitting: Podiatry

## 2018-02-05 DIAGNOSIS — I739 Peripheral vascular disease, unspecified: Secondary | ICD-10-CM | POA: Diagnosis not present

## 2018-02-05 DIAGNOSIS — B351 Tinea unguium: Secondary | ICD-10-CM | POA: Diagnosis not present

## 2018-02-05 DIAGNOSIS — M79676 Pain in unspecified toe(s): Secondary | ICD-10-CM | POA: Diagnosis not present

## 2018-02-05 DIAGNOSIS — I878 Other specified disorders of veins: Secondary | ICD-10-CM

## 2018-02-05 NOTE — Progress Notes (Signed)
Patient ID: Jose [name], male   DOB: [birth year], 84 y.o.   MRN: [id number] Complaint:  Visit Type: Patient returns to my office for continued preventative foot care services. Complaint: Patient states" my nails have grown long and thick and become painful to walk and wear shoes" .  Patient is diabetic. He presents for preventative foot care services.  Podiatric Exam: Vascular: dorsalis pedis and posterior tibial pulses are not  palpable bilateral. Capillary return is diminished. Temperature gradient is diminished. Skin turgor  diminished .  Purplish discoloration feet  B/L Sensorium: Normal Semmes Weinstein monofilament test. Normal tactile sensation bilaterally. Nail Exam: Pt has thick disfigured discolored nails with subungual debris noted bilateral entire nail hallux through fifth toenails Ulcer Exam: There is no evidence of ulcer or pre-ulcerative changes or infection. Orthopedic Exam: Muscle tone and strength are WNL. No limitations in general ROM. No crepitus or effusions noted. Foot type and digits show no abnormalities. Bony prominences are unremarkable. Skin: No Porokeratosis. No infection or ulcers  Diagnosis:  Tinea unguium, Pain in right toe, pain in left toes, PVD  Treatment & Plan Procedures and Treatment: Consent by patient was obtained for treatment procedures. The patient understood the discussion of treatment and procedures well. All questions were answered thoroughly reviewed. Debridement of mycotic and hypertrophic toenails, 1 through 5 bilateral and clearing of subungual debris. No ulceration, no infection noted.  Wife is concerned about his increased purple feet and the fact he refuses to walk some days.  Return Visit-Office Procedure: Patient instructed to return to the office for a follow up visit 3 months for continued evaluation and treatment.  Gardiner Barefoot DPM

## 2018-02-11 ENCOUNTER — Non-Acute Institutional Stay (SKILLED_NURSING_FACILITY): Payer: Medicare Other | Admitting: Family Medicine

## 2018-02-11 ENCOUNTER — Encounter: Payer: Self-pay | Admitting: Family Medicine

## 2018-02-11 DIAGNOSIS — R2681 Unsteadiness on feet: Secondary | ICD-10-CM

## 2018-02-11 DIAGNOSIS — I5042 Chronic combined systolic (congestive) and diastolic (congestive) heart failure: Secondary | ICD-10-CM | POA: Diagnosis not present

## 2018-02-11 DIAGNOSIS — F0281 Dementia in other diseases classified elsewhere with behavioral disturbance: Secondary | ICD-10-CM

## 2018-02-11 DIAGNOSIS — E1122 Type 2 diabetes mellitus with diabetic chronic kidney disease: Secondary | ICD-10-CM

## 2018-02-11 DIAGNOSIS — N183 Chronic kidney disease, stage 3 (moderate): Secondary | ICD-10-CM

## 2018-02-11 DIAGNOSIS — G309 Alzheimer's disease, unspecified: Secondary | ICD-10-CM

## 2018-02-11 DIAGNOSIS — E1169 Type 2 diabetes mellitus with other specified complication: Secondary | ICD-10-CM

## 2018-02-11 DIAGNOSIS — E785 Hyperlipidemia, unspecified: Secondary | ICD-10-CM

## 2018-02-11 NOTE — Progress Notes (Signed)
Provider:  Alain Honey, MD Location:  [facility] Room Number: N36A Place of Service:  SNF ([id number])  PCP: [name], [name], NP Patient Care Team: [name], [name], NP as PCP - General (Family Medicine) [name], MD as PCP - Cardiology (Cardiology)  Extended Emergency Contact Information Primary Emergency Contact: [name] Address: [place], [place] Phone: [phone] Mobile Phone: [phone] Relation: Spouse Secondary Emergency Contact: [name] [place] Phone: [phone] Work Phone: [phone] Relation: Daughter  Code Status: FULL Goals of Care: Advanced Directive information Advanced Directives 02/11/2018  Does Patient Have a Medical Advance Directive? Yes  Type of Paramedic of [place];Living will  Does patient want to make changes to medical advance directive? No - Patient declined  Copy of [place] in Chart? Yes - validated most recent copy scanned in chart (See row information)  Would patient like information on creating a medical advance directive? -  Pre-existing out of facility DNR order (yellow form or pink MOST form) -      Chief Complaint  Patient presents with  . Medical Management of Chronic Issues    Routine Visit    HPI: Patient is a 84 y.o. male seen today for medical management of chronic problems including: Alzheimer's dementia, type 2 diabetes chronic congestive heart failure, gait disturbance.  Psych and.  He denies any specific complaints today he is seated in a wheelchair which he says he uses for ambulation.  Review of labs from 3 months ago showed stable renal function.  Lipids were last assessed 5 months ago and were at goal and hemoglobin A1c was 7.7 September  Past Medical History:  Diagnosis Date  . Abnormality of gait   . Backache, unspecified   . CHF (congestive heart failure)  ([facility] [place])   . Colon polyps    adenomatous  . Dementia ([place]) 11/12/2012  . Depression   . Diabetes mellitus without complication ([place])   . Diverticulosis   . Essential and other specified forms of tremor   . Hemorrhoids   . History of bilateral hip replacements 11/12/2012  . Memory loss   . 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 Pain in joint, pelvic region and thigh   . Skin cancer of scalp   . Spinal stenosis, lumbar region, without neurogenic claudication   . Unspecified hereditary and idiopathic peripheral neuropathy    Past Surgical History:  Procedure Laterality Date  . CATARACT EXTRACTION, BILATERAL  2012  . JOINT REPLACEMENT    . RETINAL LASER PROCEDURE  2012   retinal wrinkle  . SKIN CANCER EXCISION    . TOTAL HIP ARTHROPLASTY Left 2000  . TOTAL HIP ARTHROPLASTY (aka REPLACEMENT) Right 2011    reports that he has quit smoking. His smoking use included cigarettes. He has never used smokeless tobacco. He reports that he drinks about 14.0 standard drinks of alcohol per week. He reports that he does not use drugs. Social History   Socioeconomic History  . Marital status: Married    Spouse name: [name]  . Number of children: 2  . Years of education: Chief Executive Officer  . Highest education level: Not on file  Occupational History  . 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 Financial resource strain: Not hard at all  .  Food insecurity:    Worry: Never true    Inability: Never true  . Transportation needs:    Medical: No    Non-medical: No  Tobacco Use  . Smoking status: Former Smoker    Types: Cigarettes  . Smokeless tobacco: Never Used  Substance and Sexual Activity  . Alcohol use: Yes    Alcohol/week: 14.0 standard drinks    Types: 14 Glasses of wine per week    Comment: occas, one glass of wine before dinner,sometimes 1/2 glass  . Drug use: No  . Sexual activity: Never  Lifestyle  .  Physical activity:    Days per week: 0 days    Minutes per session: 0 min  . Stress: Only a little  Relationships  . Social connections:    Talks on phone: Never    Gets together: More than three times a week    Attends religious service: Never    Active member of club or organization: No    Attends meetings of clubs or organizations: Never    Relationship status: Married  . Intimate partner violence:    Fear of current or ex partner: No    Emotionally abused: No    Physically abused: No    Forced sexual activity: No  Other Topics Concern  . Not on file  Social History Narrative   Patient is right handed and resides in home with wife    Functional Status Survey:    Family History  Problem Relation Age of Onset  . Heart failure Mother     Health Maintenance  Topic Date Due  . OPHTHALMOLOGY EXAM  06/06/1940  . TETANUS/TDAP  09/29/2026 (Originally 12/20/2020)  . HEMOGLOBIN A1C  06/03/2018  . FOOT EXAM  08/07/2018  . INFLUENZA VACCINE  Completed  . PNA vac Low Risk Adult  Completed    Allergies  Allergen Reactions  . Axona [Bacid]     Unknown per MAR  . Gabapentin     Hallucinations    Outpatient Encounter Medications as of 02/11/2018  Medication Sig  . buPROPion (WELLBUTRIN SR) 100 MG 12 hr tablet Take 100 mg by mouth daily.  . carvedilol (COREG) 3.125 MG tablet Take 1 tablet (3.125 mg total) by mouth 2 (two) times daily with a meal.  . Cholecalciferol (VITAMIN D) 1000 UNITS capsule Take 1,000 Units by mouth daily.    Marland Kitchen docusate sodium (COLACE) 100 MG capsule Take 100 mg by mouth daily.  Marland Kitchen donepezil (ARICEPT) 23 MG TABS tablet Take 1 tablet (23 mg total) by mouth daily.  . dorzolamide-timolol (COSOPT) 22.3-6.8 MG/ML ophthalmic solution Place 1 drop into the left eye 2 (two) times daily.   . Emollient (CERAVE) CREA Apply thin layer topically to arms, legs, and trunk once daily as needed  . Flaxseed, Linseed, (FLAXSEED OIL) 1000 MG CAPS Take 1,000 mg by mouth  daily.    . folic acid (FOLVITE) 347 MCG tablet Take 800 mcg by mouth every evening.   . furosemide (LASIX) 20 MG tablet Take 1 tablet (20 mg total) by mouth daily.  . hydrocortisone (PROCTOSOL HC) 2.5 % rectal cream Place 1 application rectally at bedtime as needed for hemorrhoids or anal itching.  Marland Kitchen lisinopril (PRINIVIL,ZESTRIL) 5 MG tablet Take 1 tablet (5 mg total) by mouth daily.  . memantine (NAMENDA XR) 28 MG CP24 24 hr capsule Take 1 capsule (28 mg total) by mouth daily.  . metFORMIN (GLUCOPHAGE) 500 MG tablet Take 500 mg by mouth 2 (two) times daily  with a meal.  . Multiple Vitamin (MULTIVITAMIN) tablet Take 1 tablet by mouth daily.    Marland Kitchen nystatin cream (MYCOSTATIN) Apply 1 application topically 2 (two) times daily as needed for dry skin. Apply to redness on  scrotum/sacral areas  . Probiotic Product (ALIGN) 4 MG CAPS Take 4 mg by mouth daily.  . simvastatin (ZOCOR) 5 MG tablet Take 5 mg by mouth daily.  . sitaGLIPtin (JANUVIA) 25 MG tablet Take 1 tablet (25 mg total) by mouth daily.  Marland Kitchen zinc oxide 20 % ointment Apply 1 application topically. Every shift apply to perineal area due to redness  . zinc oxide 20 % ointment Apply 1 application topically 3 (three) times daily as needed for irritation.   No facility-administered encounter medications on file as of 02/11/2018.     Review of Systems  Constitutional: Negative.   HENT: Positive for hearing loss.   Eyes: Negative.   Respiratory: Negative.   Cardiovascular: Negative.   Gastrointestinal: Negative.   Musculoskeletal: Positive for gait problem.  Psychiatric/Behavioral: Positive for confusion and decreased concentration.    Vitals:   02/11/18 0946  BP: 122/64  Pulse: 78  Resp: 18  Temp: (!) 97.1 F (36.2 C)  TempSrc: Oral  SpO2: 96%  Weight: 181 lb 12.8 oz (82.5 kg)  Height: 6' (1.829 m)   Body mass index is 24.66 kg/m. Physical Exam  Constitutional: He appears well-developed and well-nourished.  HENT:  Head:  Normocephalic.  Mouth/Throat: Oropharynx is clear and moist.  Neck: Normal range of motion.  Cardiovascular: Normal rate, regular rhythm and normal heart sounds.  Pulmonary/Chest: Effort normal and breath sounds normal.  Abdominal: Soft. Bowel sounds are normal.  Musculoskeletal: Normal range of motion.  Neurological: He is alert.  Oriented to person  Psychiatric: He has a normal mood and affect. His behavior is normal.  Nursing note and vitals reviewed.   Labs reviewed: Basic Metabolic Panel: Recent Labs    03/29/17 1240 03/30/17 0337 03/31/17 0602  08/08/17 10/21/17 11/14/17  NA 136 139 141   < > 137 139  139 140  140  K 3.6 3.3* 3.6   < > 4.0 3.9  3.9 4.0  4.0  CL 103 110 112*  --   --   --   --   CO2 25 22 22   --   --   --   --   GLUCOSE 173* 127* 109*  --   --   --   --   BUN 10 11 14    < > 19 21 20  20   CREATININE 1.09 1.00 1.05   < > 0.97 1.2  1.21 0.93  0.93  CALCIUM 9.0 8.1* 8.3*   < > 9.5 9.4 8.5  8.5   < > = values in this interval not displayed.   Liver Function Tests: Recent Labs    03/29/17 1240 03/30/17 0337 04/18/17 08/08/17  AST 25 20 14  14 17   ALT 25 17 14  14 22   ALKPHOS 113 75 84  84 109  BILITOT 0.6 0.8 0.4 0.5  PROT 6.8 5.3* 5.8  --   ALBUMIN 3.8 2.8* 3.4 4.1   No results for input(s): LIPASE, AMYLASE in the last 8760 hours. No results for input(s): AMMONIA in the last 8760 hours. CBC: Recent Labs    03/29/17 1240 03/30/17 0337 03/31/17 0602  04/18/17 08/08/17 10/21/17  WBC 13.9* 6.4 6.7  --  6.5  6.5 8.6 10.2  10.2  NEUTROABS 12.6*  --   --   --   --   --   --  HGB 14.3 11.4* 11.0*   < > 12.1*  12.1 14.3 13.2*  13.2  HCT 43.0 34.3* 33.7*   < > 35*  35.3 40.3 39*  38.8  MCV 96.2 98.3 98.5  --   --  93.1  --   PLT 129* 100* 90*  --  131*  --  180   < > = values in this interval not displayed.   Cardiac Enzymes: No results for input(s): CKTOTAL, CKMB, CKMBINDEX, TROPONINI in the last 8760 hours. BNP: Invalid input(s):  POCBNP Lab Results  Component Value Date   HGBA1C 7.7 12/03/2017   Lab Results  Component Value Date   TSH 2.61 08/08/2017   No results found for: VITAMINB12 No results found for: FOLATE No results found for: IRON, TIBC, FERRITIN  Imaging and Procedures obtained prior to SNF admission: Vas [place] Abi With/wo Tbi  Result Date: 11/14/2017 LOWER EXTREMITY DOPPLER STUDY  Comparison Study: In 6/19 [employer] performed ABI and bilateral lower                   extremity arterial duplex at [facility] Right lower                   extremity had biphasic waveforms throughout. Left lower                   extremity had biphasic waveforms throughout except triphasic                   waveforms in the common femoral artery. ABI's were 1.20 on the                   right and 1.23 on the left. May represent a false negative                   result due to lack of compressibility of heavily calcified                   vessels given patient's age. Performing Technologist: Guinevere Ferrari RVT  Examination Guidelines: A complete evaluation includes at mininum, Doppler waveform signals and systolic blood pressure reading at the level of bilateral brachial, anterior tibial, and posterior tibial arteries, when vessel segments are accessible. Bilateral testing is considered an integral part of a complete examination. Photoelectric Plethysmograph (PPG) waveforms and toe systolic pressure readings are included as required and additional duplex testing as needed. Limited examinations for reoccurring indications may be performed as noted.  ABI Findings: +---------+------------------+-----+---------+--------+ Right    Rt Pressure (mmHg)IndexWaveform Comment  +---------+------------------+-----+---------+--------+ Brachial 149                                      +---------+------------------+-----+---------+--------+ ATA      170               1.14 triphasic          +---------+------------------+-----+---------+--------+ PTA      179               1.20 triphasic         +---------+------------------+-----+---------+--------+ PERO     177               1.19 triphasic         +---------+------------------+-----+---------+--------+ Great Toe108               0.72 Normal  damped   +---------+------------------+-----+---------+--------+ +---------+------------------+-----+---------+---------------------------------+ Left     Lt Pressure (mmHg)IndexWaveform Comment                           +---------+------------------+-----+---------+---------------------------------+ Brachial 143                                                               +---------+------------------+-----+---------+---------------------------------+ ATA      177               1.19 triphasic                                  +---------+------------------+-----+---------+---------------------------------+ PTA      186               1.25 triphasic                                  +---------+------------------+-----+---------+---------------------------------+ PERO     165               1.11 triphasic                                  +---------+------------------+-----+---------+---------------------------------+ Great Toe                       Normal   blood pressure not obtained due                                            to movements                      +---------+------------------+-----+---------+---------------------------------+ +-------+-----------+-----------+------------+------------+ ABI/TBIToday's ABIToday's TBIPrevious ABIPrevious TBI +-------+-----------+-----------+------------+------------+ Right  1.20       0.72       1.20                     +-------+-----------+-----------+------------+------------+ Left   1.25                  1.23                     +-------+-----------+-----------+------------+------------+  TOES Findings: +----------+---------------+--------+-------+ Right ToesPressure (mmHg)WaveformComment +----------+---------------+--------+-------+ 1st Digit 108            Normal  damped  +----------+---------------+--------+-------+ 2nd Digit                Abnormaldamped  +----------+---------------+--------+-------+ 3rd Digit                Normal  damped  +----------+---------------+--------+-------+ 4th Digit                Abnormaldamped  +----------+---------------+--------+-------+ 5th Digit                Abnormaldamped  +----------+---------------+--------+-------+ +---------+---------------+--------+-------+ Left ToesPressure (mmHg)WaveformComment +---------+---------------+--------+-------+ 1st Digit               Normal  damped  +---------+---------------+--------+-------+ 2nd Digit  Abnormaldamped  +---------+---------------+--------+-------+ 3rd Digit               Abnormaldamped  +---------+---------------+--------+-------+ 4th Digit               Abnormaldamped  +---------+---------------+--------+-------+ 5th Digit               Abnormaldamped  +---------+---------------+--------+-------+  Bilateral ABIs appear essentially unchanged compared to prior study on 6/19.  Final Interpretation: Right: Resting right ankle-brachial index is within normal range. No evidence of significant right lower extremity arterial disease. The right toe-brachial index is normal. PPG tracings appear dampened. Left: Resting left ankle-brachial index is within normal range. No evidence of significant left lower extremity arterial disease. Technically difficult study and left TBI not obtained due to movements.  *See table(s) above for measurements and observations.  Electronically signed by Ena Dawley MD on 11/14/2017 at 12:32:09 PM.    Final    Vas [place] Lower Extremity Venous (dvt)  Result Date: 11/14/2017  Lower Venous DVT Study Indications:  Pain, and Swelling.  Risk Factors: None identified. Limitations: Body habitus. Performing Technologist: Guinevere Ferrari RVT  Examination Guidelines: A complete evaluation includes B-mode imaging, spectral Doppler, color Doppler, and power Doppler as needed of all accessible portions of each vessel. Bilateral testing is considered an integral part of a complete examination. Limited examinations for reoccurring indications may be performed as noted. The reflux portion of the exam is performed with the patient in reverse Trendelenburg.  Right Venous Findings: +---------+---------------+---------+-----------+----------+-------+          CompressibilityPhasicitySpontaneityPropertiesSummary +---------+---------------+---------+-----------+----------+-------+ CFV      Full           Yes      Yes                          +---------+---------------+---------+-----------+----------+-------+ SFJ      Full           Yes      Yes                          +---------+---------------+---------+-----------+----------+-------+ FV Prox  Full           Yes      Yes                          +---------+---------------+---------+-----------+----------+-------+ FV Mid   Full                                                 +---------+---------------+---------+-----------+----------+-------+ FV DistalFull           Yes      Yes                          +---------+---------------+---------+-----------+----------+-------+ PFV      Full                                                 +---------+---------------+---------+-----------+----------+-------+ POP      Full           Yes      Yes                          +---------+---------------+---------+-----------+----------+-------+  PTV      Full           Yes      Yes                          +---------+---------------+---------+-----------+----------+-------+ PERO     Full           Yes      Yes                           +---------+---------------+---------+-----------+----------+-------+ Gastroc  Full                                                 +---------+---------------+---------+-----------+----------+-------+ GSV      Full           Yes      Yes                          +---------+---------------+---------+-----------+----------+-------+  Right Technical Findings: Not visualized segments include segments of calf veins.  Left Venous Findings: +---------+---------------+---------+-----------+----------+-------+          CompressibilityPhasicitySpontaneityPropertiesSummary +---------+---------------+---------+-----------+----------+-------+ CFV      Full           Yes      Yes                          +---------+---------------+---------+-----------+----------+-------+ SFJ      Full           Yes      Yes                          +---------+---------------+---------+-----------+----------+-------+ FV Prox  Full           Yes      Yes                          +---------+---------------+---------+-----------+----------+-------+ FV Mid   Full                                                 +---------+---------------+---------+-----------+----------+-------+ FV DistalFull           Yes      Yes                          +---------+---------------+---------+-----------+----------+-------+ PFV      Full                                                 +---------+---------------+---------+-----------+----------+-------+ POP      Full           Yes      Yes                          +---------+---------------+---------+-----------+----------+-------+ PTV      Full           Yes  Yes                          +---------+---------------+---------+-----------+----------+-------+ PERO     Full           Yes      Yes                          +---------+---------------+---------+-----------+----------+-------+ Gastroc  Full                                                  +---------+---------------+---------+-----------+----------+-------+ GSV      Full           Yes      Yes                          +---------+---------------+---------+-----------+----------+-------+  Left Technical Findings: Not visualized segments include segments of calf veins.   Final Interpretation: Right: No evidence of deep vein thrombosis in the lower extremity. No indirect evidence of obstruction proximal to the inguinal ligament. Left: No evidence of deep vein thrombosis in the lower extremity. No indirect evidence of obstruction proximal to the inguinal ligament.  *See table(s) above for measurements and observations. Electronically signed by Ena Dawley MD on 11/14/2017 at 12:30:57 PM.    Final     Assessment/Plan 1. Chronic combined systolic and diastolic congestive heart failure ([place]) Well compensated at this point.  Clear  2. Type 2 diabetes mellitus with stage 3 chronic kidney disease, without long-term current use of insulin (H[facility]) Takes 1C was 7.7 to same  3. Hyperlipidemia associated with type 2 diabetes mellitus ([facility]) Low-dose simvastatin at 5 mg 75  4. Alzheimer's dementia with behavioral disturbance, unspecified timing of dementia onset ([place]) .  Take Aricept as well as Namenda  5. Unsteady gait History of falls but none recent patient use a wheelchair for ambulation   Family/ staff Communication: Findings communicated to nursing staff  Labs/tests ordered: none  Lillette Boxer. [place], [place] [address], [place] Office [phone]

## 2018-02-17 ENCOUNTER — Encounter: Payer: Self-pay | Admitting: Family

## 2018-02-17 ENCOUNTER — Non-Acute Institutional Stay (SKILLED_NURSING_FACILITY): Payer: Medicare Other | Admitting: Family

## 2018-02-17 DIAGNOSIS — E119 Type 2 diabetes mellitus without complications: Secondary | ICD-10-CM

## 2018-02-17 NOTE — Progress Notes (Signed)
Location:  [facility] Room Number: 75 Place of Service:  SNF (31) Provider: Dalaya Suppa FNP-C  Alexxander Kurt, [name], NP  Patient Care Team: Nanea Jared, [name], NP as PCP - General (Family Medicine) [name], MD as PCP - Cardiology (Cardiology)  Extended Emergency Contact Information Primary Emergency Contact: [name] Address: [place], [place] Phone: [phone] Mobile Phone: [phone] Relation: Spouse Secondary Emergency Contact: [name] [place] of [place] Phone: [phone] Work Phone: [phone] Relation: Daughter  Code Status:  Full Code  Goals of care: Advanced Directive information Advanced Directives 02/17/2018  Does Patient Have a Medical Advance Directive? Yes  Type of Paramedic of [place];Living will  Does patient want to make changes to medical advance directive? No - Patient declined  Copy of [place] in Chart? Yes - validated most recent copy scanned in chart (See row information)  Would patient like information on creating a medical advance directive? -  Pre-existing out of facility DNR order (yellow form or pink MOST form) -     Chief Complaint  Patient presents with  . Acute Visit    Elevated Blood Sugar    HPI:  Pt is a 84 y.o. male seen today at [facility] for an acute visit for evaluation of high blood sugar.He is seen in his room today per facility SBAR report.Nurse reports patient's CBG have been high.His CBG log reviewed readings ranging in the 200's to occasional 300's.Nurse states patient snacks on sugary snacks and drinks at times.No signs of hypo/hypergylcemia reported.Patient denies any acute issues during visit though HPI and ROS limited due to his cognitive impairment.     Past Medical History:  Diagnosis Date  . Abnormality of gait   . Backache, unspecified   . CHF (congestive heart  failure) ([place])   . Colon polyps    adenomatous  . Dementia ([place]) 11/12/2012  . Depression   . Diabetes mellitus without complication ([place])   . Diverticulosis   . Essential and other specified forms of tremor   . Hemorrhoids   . History of bilateral hip replacements 11/12/2012  . Memory loss   . 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 Pain in joint, pelvic region and thigh   . Skin cancer of scalp   . Spinal stenosis, lumbar region, without neurogenic claudication   . Unspecified hereditary and idiopathic peripheral neuropathy    Past Surgical History:  Procedure Laterality Date  . CATARACT EXTRACTION, BILATERAL  2012  . JOINT REPLACEMENT    . RETINAL LASER PROCEDURE  2012   retinal wrinkle  . SKIN CANCER EXCISION    . TOTAL HIP ARTHROPLASTY Left 2000  . TOTAL HIP ARTHROPLASTY (aka REPLACEMENT) Right 2011    Allergies  Allergen Reactions  . Axona [Bacid]     Unknown per MAR  . Gabapentin     Hallucinations    Outpatient Encounter Medications as of 02/17/2018  Medication Sig  . buPROPion (WELLBUTRIN SR) 100 MG 12 hr tablet Take 100 mg by mouth daily.  . carvedilol (COREG) 3.125 MG tablet Take 1 tablet (3.125 mg total) by mouth 2 (two) times daily with a meal.  . Cholecalciferol (VITAMIN D) 1000 UNITS capsule Take 1,000 Units by mouth daily.    Marland Kitchen docusate sodium (COLACE) 100 MG capsule Take 100 mg by mouth daily.  Marland Kitchen  donepezil (ARICEPT) 23 MG TABS tablet Take 1 tablet (23 mg total) by mouth daily.  . dorzolamide-timolol (COSOPT) 22.3-6.8 MG/ML ophthalmic solution Place 1 drop into the left eye 2 (two) times daily.   . Emollient (CERAVE) CREA Apply thin layer topically to arms, legs, and trunk once daily as needed  . Flaxseed, Linseed, (FLAXSEED OIL) 1000 MG CAPS Take 1,000 mg by mouth daily.    . folic acid (FOLVITE) 973 MCG tablet Take 800 mcg by mouth every evening.   . furosemide (LASIX) 20 MG tablet Take 1 tablet (20 mg total) by mouth  daily.  Marland Kitchen lisinopril (PRINIVIL,ZESTRIL) 5 MG tablet Take 1 tablet (5 mg total) by mouth daily.  . memantine (NAMENDA XR) 28 MG CP24 24 hr capsule Take 1 capsule (28 mg total) by mouth daily.  . metFORMIN (GLUCOPHAGE) 500 MG tablet Take 500 mg by mouth 2 (two) times daily with a meal.  . Multiple Vitamin (MULTIVITAMIN) tablet Take 1 tablet by mouth daily.    Marland Kitchen nystatin cream (MYCOSTATIN) Apply 1 application topically 2 (two) times daily as needed for dry skin. Apply to redness on  scrotum/sacral areas  . Probiotic Product (ALIGN) 4 MG CAPS Take 4 mg by mouth daily.  . simvastatin (ZOCOR) 5 MG tablet Take 5 mg by mouth daily.  . sitaGLIPtin (JANUVIA) 25 MG tablet Take 1 tablet (25 mg total) by mouth daily.  Marland Kitchen zinc oxide 20 % ointment Apply 1 application topically. Every shift apply to perineal area due to redness  . zinc oxide 20 % ointment Apply 1 application topically 3 (three) times daily as needed for irritation.  . [DISCONTINUED] hydrocortisone (PROCTOSOL HC) 2.5 % rectal cream Place 1 application rectally at bedtime as needed for hemorrhoids or anal itching.   No facility-administered encounter medications on file as of 02/17/2018.     Review of Systems  Unable to perform ROS: Dementia (additional information provided by facility Nurse )    Immunization History  Administered Date(s) Administered  . Influenza-Unspecified 01/10/2017, 01/13/2018  . PPD Test 06/24/2016  . Zoster Recombinat (Shingrix) 06/10/2017, 08/12/2017   Pertinent  Health Maintenance Due  Topic Date Due  . OPHTHALMOLOGY EXAM  06/06/1940  . HEMOGLOBIN A1C  06/03/2018  . FOOT EXAM  08/07/2018  . INFLUENZA VACCINE  Completed  . PNA vac Low Risk Adult  Completed   Fall Risk  12/13/2017 10/14/2017 12/07/2016 01/19/2016 07/21/2015  Falls in the past year? No No No Yes Yes  Number falls in past yr: - - - 1 1  Injury with Fall? - - - No No  Risk for fall due to : - Impaired balance/gait - Impaired balance/gait -  Follow  up - - - Falls prevention discussed Falls prevention discussed    Vitals:   02/17/18 1058  BP: 140/87  Pulse: 78  Resp: 18  Temp: (!) 97.5 F (36.4 C)  TempSrc: Oral  SpO2: 99%  Weight: 182 lb 12.8 oz (82.9 kg)  Height: 6' (1.829 m)   Body mass index is 24.79 kg/m. Physical Exam  Constitutional: He appears well-developed and well-nourished.  Elderly in no acute distress   HENT:  Head: Normocephalic.  Mouth/Throat: Oropharynx is clear and moist. No oropharyngeal exudate.  Eyes: Pupils are equal, round, and reactive to light. Conjunctivae and EOM are normal. Right eye exhibits no discharge. Left eye exhibits no discharge. No scleral icterus.  Neck: Normal range of motion. No JVD present. No thyromegaly present.  Cardiovascular: Normal rate, regular rhythm, normal  heart sounds and intact distal pulses. Exam reveals no gallop and no friction rub.  No murmur heard. Pulmonary/Chest: Effort normal and breath sounds normal. No respiratory distress. He has no wheezes. He has no rales.  Abdominal: Soft. Bowel sounds are normal. He exhibits no distension and no mass. There is no tenderness. There is no rebound and no guarding.  Musculoskeletal: He exhibits no tenderness.  Moves x 4 extremities.Bilateral lower extremities trace edema knee high compression stockings in place   Lymphadenopathy:    He has no cervical adenopathy.  Neurological:  Confused at his baseline   Skin: Skin is warm and dry. No rash noted. No erythema. No pallor.  Psychiatric: He has a normal mood and affect. His speech is normal and behavior is normal. Judgment and thought content normal. He exhibits abnormal recent memory.  Nursing note and vitals reviewed.   Labs reviewed: Recent Labs    03/29/17 1240 03/30/17 0337 03/31/17 0602  08/08/17 10/21/17 11/14/17  NA 136 139 141   < > 137 139  139 140  140  K 3.6 3.3* 3.6   < > 4.0 3.9  3.9 4.0  4.0  CL 103 110 112*  --   --   --   --   CO2 25 22 22   --   --    --   --   GLUCOSE 173* 127* 109*  --   --   --   --   BUN 10 11 14    < > 19 21 20  20   CREATININE 1.09 1.00 1.05   < > 0.97 1.2  1.21 0.93  0.93  CALCIUM 9.0 8.1* 8.3*   < > 9.5 9.4 8.5  8.5   < > = values in this interval not displayed.   Recent Labs    03/29/17 1240 03/30/17 0337 04/18/17 08/08/17  AST 25 20 14  14 17   ALT 25 17 14  14 22   ALKPHOS 113 75 84  84 109  BILITOT 0.6 0.8 0.4 0.5  PROT 6.8 5.3* 5.8  --   ALBUMIN 3.8 2.8* 3.4 4.1   Recent Labs    03/29/17 1240 03/30/17 0337 03/31/17 0602  04/18/17 08/08/17 10/21/17  WBC 13.9* 6.4 6.7  --  6.5  6.5 8.6 10.2  10.2  NEUTROABS 12.6*  --   --   --   --   --   --   HGB 14.3 11.4* 11.0*   < > 12.1*  12.1 14.3 13.2*  13.2  HCT 43.0 34.3* 33.7*   < > 35*  35.3 40.3 39*  38.8  MCV 96.2 98.3 98.5  --   --  93.1  --   PLT 129* 100* 90*  --  131*  --  180   < > = values in this interval not displayed.   Lab Results  Component Value Date   TSH 2.61 08/08/2017   Lab Results  Component Value Date   HGBA1C 7.7 12/03/2017   Lab Results  Component Value Date   CHOL 150 09/05/2017   HDL 49 01/31/2017   LDLCALC 89 09/05/2017   TRIG 113 09/05/2017    Significant Diagnostic Results in last 30 days:  No results found.  Assessment/Plan  DM type 2, goal HbA1c < 8% (HCC)  Lab Results  Component Value Date   HGBA1C 7.7 12/03/2017  CBG readings in the 200's with some readings in the 300's suspect possible from snacks and soft drinks.will continue on metformin  500 mg tablet twice daily and sitaGliptin 25 mg tablet daily for now.Strict no concentrated sweets snacks or drinks.Facility Nurse to [employer] provider if CBG x 3 consecutive readings are above 300.Continue on ACE inhibitor for renal protection and statin for cardiovascular prevention.up to date with annual foot exam.continue to follow up with podiatrist.Awaiting ophthalmology appointment wife requested to make appointment in previous visit then notify  facility.    Family/ staff Communication: Reviewed plan of care with patient and facility Nurse supervisor  Labs/tests ordered: None   Sandrea Hughs, NP

## 2018-02-19 IMAGING — CR DG CERVICAL SPINE COMPLETE 4+V
8 series · 8 of 8 positions shown · non-contrast
Comparison: PET-CT 06/18/2012.  MRI 06/01/2012 .

CLINICAL DATA: Fall.

EXAM:
CERVICAL SPINE - COMPLETE 4+ VIEW

[w cervical spine lat (1 of 2)]
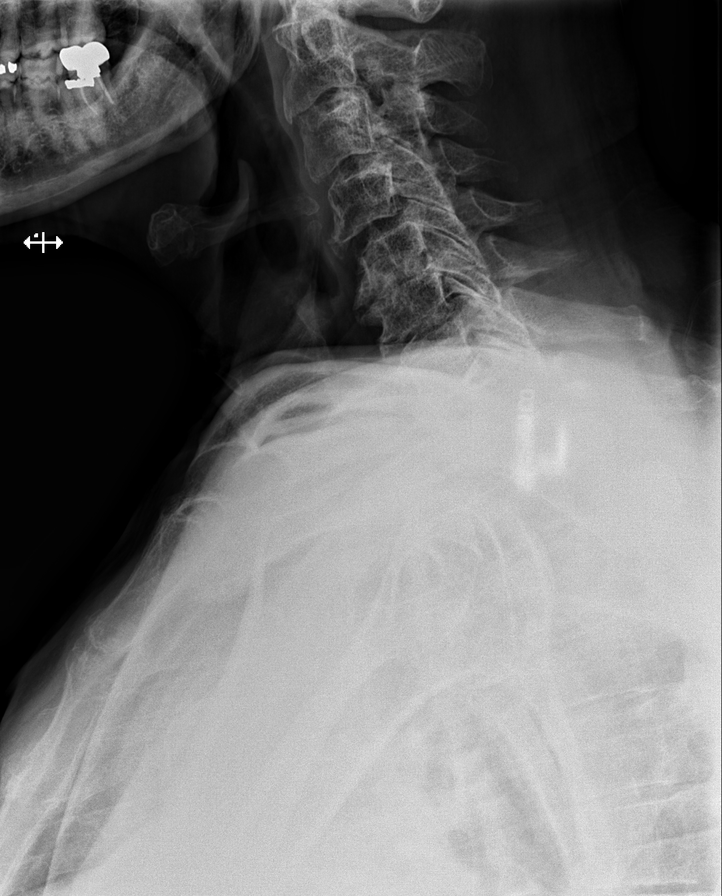

[w cervical spine lat (2 of 2)]
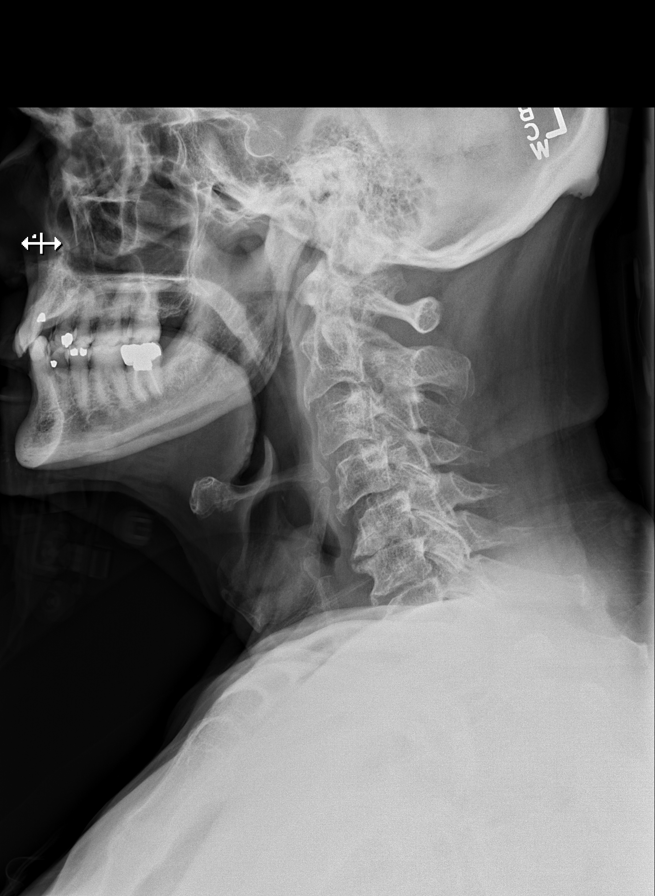

[w cervical swimmers]
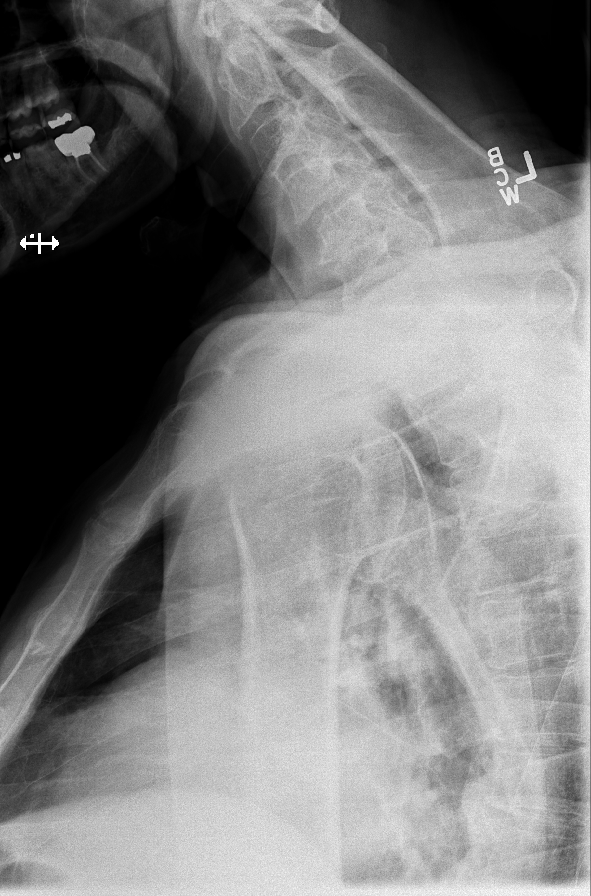

[t cervical spine ap]
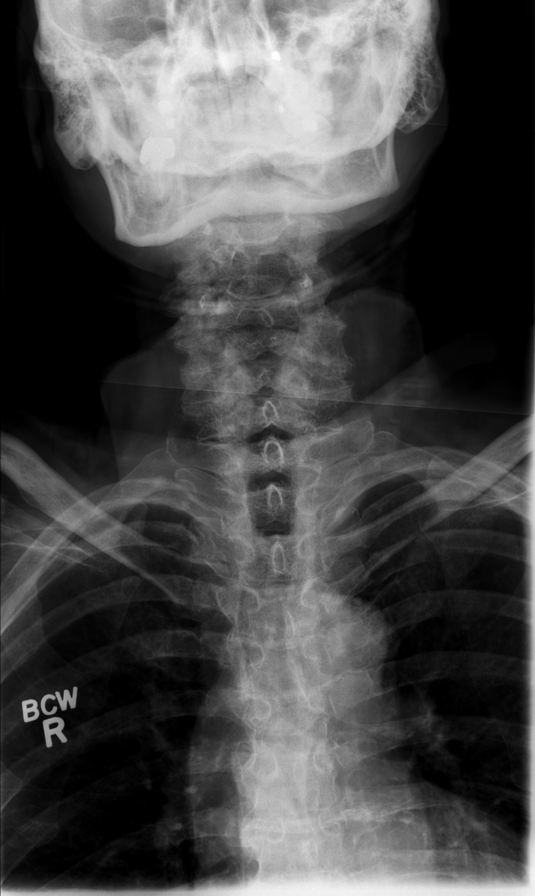

[t cervical spine odontoid]
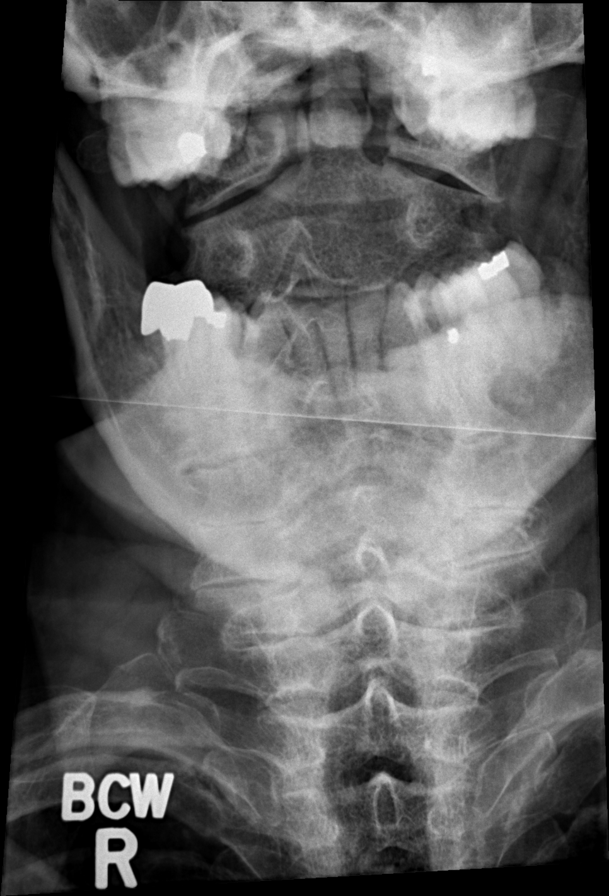

[t cervical spine obl (1 of 3)]
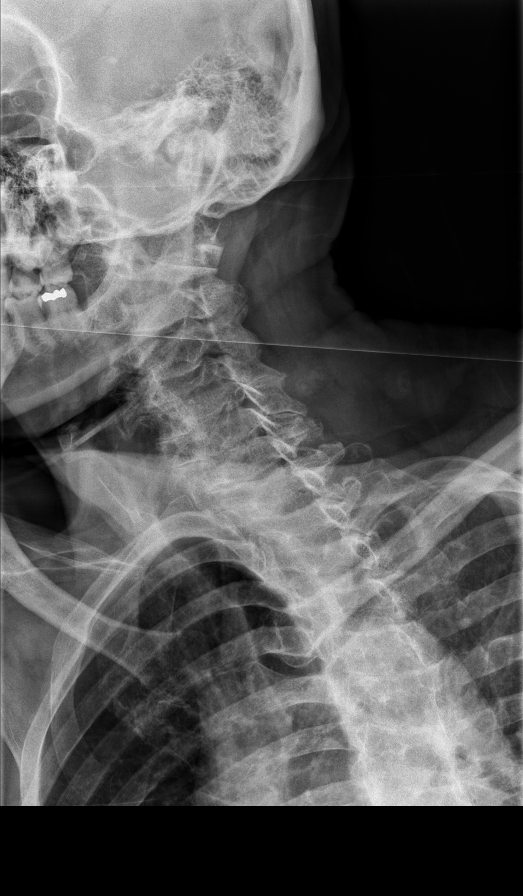

[t cervical spine obl (2 of 3)]
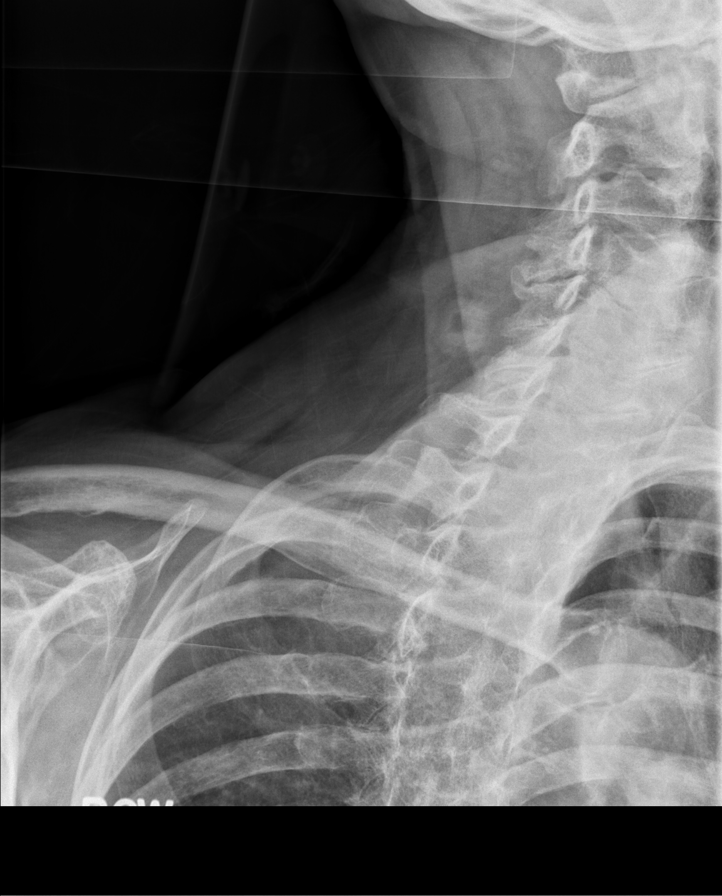

[t cervical spine obl (3 of 3)]
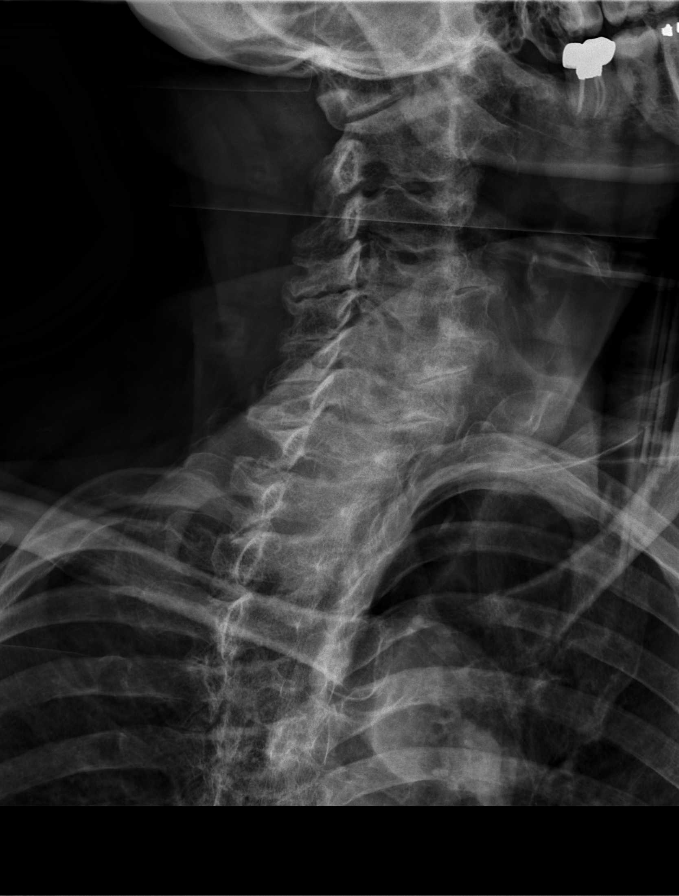

[8 of 8 positions shown; findings below may reference images not displayed]

FINDINGS: Diffuse osteopenia and multilevel severe degenerative change. 3 mm
anterolisthesis C3-C4. 5 mm anterolisthesis C4-C5. No evidence of
fracture or dislocation. Pulmonary apices are clear.
IMPRESSION: Diffuse osteopenia and multilevel degenerative change. 3 mm
anterolisthesis C3-C4. 5 mm anterolisthesis C4-C5. No evidence of
fracture or dislocation.

## 2018-03-12 ENCOUNTER — Non-Acute Institutional Stay (SKILLED_NURSING_FACILITY): Payer: Medicare Other | Admitting: Family

## 2018-03-12 ENCOUNTER — Encounter: Payer: Self-pay | Admitting: Family

## 2018-03-12 DIAGNOSIS — E1169 Type 2 diabetes mellitus with other specified complication: Secondary | ICD-10-CM

## 2018-03-12 DIAGNOSIS — E785 Hyperlipidemia, unspecified: Secondary | ICD-10-CM

## 2018-03-12 DIAGNOSIS — G309 Alzheimer's disease, unspecified: Secondary | ICD-10-CM | POA: Diagnosis not present

## 2018-03-12 DIAGNOSIS — I5042 Chronic combined systolic (congestive) and diastolic (congestive) heart failure: Secondary | ICD-10-CM | POA: Diagnosis not present

## 2018-03-12 DIAGNOSIS — F0281 Dementia in other diseases classified elsewhere with behavioral disturbance: Secondary | ICD-10-CM

## 2018-03-12 DIAGNOSIS — E119 Type 2 diabetes mellitus without complications: Secondary | ICD-10-CM

## 2018-03-12 NOTE — Progress Notes (Signed)
Location:  [place] Room Number: 40 Place of Service:  SNF (31) Provider: Dinah [name] FNP-C   [name], [name], NP  Patient Care Team: [name], [name], NP as PCP - General (Family Medicine) [name], MD as PCP - Cardiology (Cardiology)  Extended Emergency Contact Information Primary Emergency Contact: [name] Address: [place], [place] of [place] Phone: [phone] Mobile Phone: [phone] Relation: Spouse Secondary Emergency Contact: [name] States of [place] Phone: [phone] Work Phone: [phone] Relation: Daughter  Code Status:  Full code  Goals of care: Advanced Directive information Advanced Directives 03/12/2018  Does Patient Have a Medical Advance Directive? Yes  Type of Paramedic of [place];Living will  Does patient want to make changes to medical advance directive? No - Patient declined  Copy of [place] in Chart? Yes - validated most recent copy scanned in chart (See row information)  Would patient like information on creating a medical advance directive? -  Pre-existing out of facility DNR order (yellow form or pink MOST form) -     Chief Complaint  Patient presents with  . Medical Management of Chronic Issues    Routine Visit    HPI:  Pt is a 84 y.o. male seen today [place] for medical management of chronic diseases.He has a medical history of Type 2 DM,chronic combined systolic and diastolic congestive heart failure,Hyperlipidemia,Alzheimer's Dementia among other conditions.He is seen in his room today with facility Nurse present at bedside.He denies any acute issues though HPI limited due to his cognitive impairment.Facility Nurse states patient has an appointment with dermatologist later this afternoon for removal of skin lesion on top of his head.His CBG reviewed readings have improved from previous  visit though still in the 200's no 300's noted.CCD diet and snacks ordered in previous visit per has been discontinued per patient's POA patient is a picky eater.POA will select sugar free Jello and ice cream on menus per facility Nurse notes.      Past Medical History:  Diagnosis Date  . Abnormality of gait   . Backache, unspecified   . CHF (congestive heart failure) ([place])   . Colon polyps    adenomatous  . Dementia ([place]) 11/12/2012  . Depression   . Diabetes mellitus without complication ([place])   . Diverticulosis   . Essential and other specified forms of tremor   . Hemorrhoids   . History of bilateral hip replacements 11/12/2012  . Memory loss   . 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 Pain in joint, pelvic region and thigh   . Skin cancer of scalp   . Spinal stenosis, lumbar region, without neurogenic claudication   . Unspecified hereditary and idiopathic peripheral neuropathy    Past Surgical History:  Procedure Laterality Date  . CATARACT EXTRACTION, BILATERAL  2012  . JOINT REPLACEMENT    . RETINAL LASER PROCEDURE  2012   retinal wrinkle  . SKIN CANCER EXCISION    . TOTAL HIP ARTHROPLASTY Left 2000  . TOTAL HIP ARTHROPLASTY (aka REPLACEMENT) Right 2011    Allergies  Allergen Reactions  . Axona [Bacid]     Unknown per MAR  . Gabapentin     Hallucinations    Allergies as of 03/12/2018      Reactions   Axona [bacid]    Unknown per MAR   Gabapentin  Hallucinations      Medication List        Accurate as of 03/12/18  8:54 PM. Always use your most recent med list.          ALIGN 4 MG Caps Take 4 mg by mouth daily.   buPROPion 100 MG 12 hr tablet Commonly known as:  WELLBUTRIN SR Take 100 mg by mouth daily.   carvedilol 3.125 MG tablet Commonly known as:  COREG Take 1 tablet (3.125 mg total) by mouth 2 (two) times daily with a meal.   CERAVE Crea Apply thin layer topically to arms, legs, and trunk once daily  as needed   docusate sodium 100 MG capsule Commonly known as:  COLACE Take 100 mg by mouth daily.   donepezil 23 MG Tabs tablet Commonly known as:  ARICEPT Take 1 tablet (23 mg total) by mouth daily.   dorzolamide-timolol 22.3-6.8 MG/ML ophthalmic solution Commonly known as:  COSOPT Place 1 drop into the left eye 2 (two) times daily.   Flaxseed Oil 1000 MG Caps Take 1,000 mg by mouth daily.   folic acid 979 MCG tablet Commonly known as:  FOLVITE Take 800 mcg by mouth every evening.   furosemide 20 MG tablet Commonly known as:  LASIX Take 1 tablet (20 mg total) by mouth daily.   lisinopril 5 MG tablet Commonly known as:  PRINIVIL,ZESTRIL Take 1 tablet (5 mg total) by mouth daily.   memantine 28 MG Cp24 24 hr capsule Commonly known as:  NAMENDA XR Take 1 capsule (28 mg total) by mouth daily.   metFORMIN 500 MG tablet Commonly known as:  GLUCOPHAGE Take 500 mg by mouth 2 (two) times daily with a meal.   multivitamin tablet Take 1 tablet by mouth daily.   nystatin cream Commonly known as:  MYCOSTATIN Apply 1 application topically 2 (two) times daily as needed for dry skin. Apply to redness on  scrotum/sacral areas   simvastatin 5 MG tablet Commonly known as:  ZOCOR Take 5 mg by mouth daily.   sitaGLIPtin 25 MG tablet Commonly known as:  JANUVIA Take 1 tablet (25 mg total) by mouth daily.   Vitamin D 1000 units capsule Take 1,000 Units by mouth daily.   zinc oxide 20 % ointment Apply 1 application topically. Every shift apply to perineal area due to redness   zinc oxide 20 % ointment Apply 1 application topically 3 (three) times daily as needed for irritation.       Review of Systems  Unable to perform ROS: Dementia (additional information provided by facility Nurse )  Constitutional: Negative for appetite change, chills, fatigue and fever.  HENT: Negative for congestion, rhinorrhea, sinus pressure, sinus pain, sneezing, sore throat and trouble  swallowing.   Eyes: Positive for visual disturbance. Negative for discharge, redness and itching.       Wears eye glasses  Respiratory: Negative for cough, chest tightness, shortness of breath and wheezing.   Cardiovascular: Positive for leg swelling. Negative for chest pain and palpitations.  Gastrointestinal: Negative for abdominal distention, abdominal pain, constipation, diarrhea, nausea and vomiting.  Endocrine: Negative for cold intolerance, heat intolerance, polydipsia, polyphagia and polyuria.  Genitourinary: Negative for dysuria, flank pain and urgency.  Musculoskeletal: Positive for gait problem.  Skin: Negative for color change, pallor, rash and wound.  Neurological: Negative for dizziness, light-headedness and headaches.  Hematological: Does not bruise/bleed easily.  Psychiatric/Behavioral: Positive for confusion. Negative for agitation and sleep disturbance. The patient is not nervous/anxious.  Immunization History  Administered Date(s) Administered  . Influenza-Unspecified 01/10/2017, 01/13/2018  . PPD Test 06/24/2016  . Zoster Recombinat (Shingrix) 06/10/2017, 08/12/2017   Pertinent  Health Maintenance Due  Topic Date Due  . OPHTHALMOLOGY EXAM  03/19/2018 (Originally 06/06/1940)  . HEMOGLOBIN A1C  06/03/2018  . FOOT EXAM  08/07/2018  . INFLUENZA VACCINE  Completed  . PNA vac Low Risk Adult  Completed   Fall Risk  12/13/2017 10/14/2017 12/07/2016 01/19/2016 07/21/2015  Falls in the past year? No No No Yes Yes  Number falls in past yr: - - - 1 1  Injury with Fall? - - - No No  Risk for fall due to : - Impaired balance/gait - Impaired balance/gait -  Follow up - - - Falls prevention discussed Falls prevention discussed     Vitals:   03/12/18 1151  BP: 101/61  Pulse: 86  Resp: 20  Temp: 99.7 F (37.6 C)  TempSrc: Oral  SpO2: 98%  Weight: 179 lb 4.8 oz (81.3 kg)  Height: 6' (1.829 m)   Body mass index is 24.32 kg/m. Physical Exam  Constitutional: He appears  well-developed and well-nourished.  Pleasantly confused elderly in no acute distress  HENT:  Head: Normocephalic.  Right Ear: External ear normal.  Left Ear: External ear normal.  Mouth/Throat: Oropharynx is clear and moist. No oropharyngeal exudate.  Eyes: Pupils are equal, round, and reactive to light. Conjunctivae and EOM are normal. Right eye exhibits no discharge. Left eye exhibits no discharge. No scleral icterus.  Neck: Normal range of motion. No JVD present. No thyromegaly present.  Cardiovascular: Normal rate, regular rhythm, normal heart sounds and intact distal pulses. Exam reveals no gallop and no friction rub.  No murmur heard. Pulmonary/Chest: Effort normal and breath sounds normal. No respiratory distress. He has no wheezes. He has no rales.  Abdominal: Soft. Bowel sounds are normal. He exhibits no distension and no mass. There is no tenderness. There is no rebound and no guarding.  Genitourinary:  Genitourinary Comments: incontinent  Musculoskeletal: Normal range of motion. He exhibits no tenderness.  Unsteady gait self propels on wheelchair.Bilateral lower extremities trace-1+ edema knee high ted hose in place   Lymphadenopathy:    He has no cervical adenopathy.  Neurological:  Pleasantly confused at his baseline.   Skin: Skin is warm and dry. No rash noted. No erythema. No pallor.  Psychiatric: He has a normal mood and affect. His speech is normal and behavior is normal. Judgment and thought content normal. Cognition and memory are impaired.    Labs reviewed: Recent Labs    03/29/17 1240 03/30/17 0337 03/31/17 0602  08/08/17 10/21/17 11/14/17  NA 136 139 141   < > 137 139  139 140  140  K 3.6 3.3* 3.6   < > 4.0 3.9  3.9 4.0  4.0  CL 103 110 112*  --   --   --   --   CO2 25 22 22   --   --   --   --   GLUCOSE 173* 127* 109*  --   --   --   --   BUN 10 11 14    < > 19 21 20  20   CREATININE 1.09 1.00 1.05   < > 0.97 1.2  1.21 0.93  0.93  CALCIUM 9.0 8.1* 8.3*    < > 9.5 9.4 8.5  8.5   < > = values in this interval not displayed.   Recent Labs  03/29/17 1240 03/30/17 0337 04/18/17 08/08/17  AST 25 20 14  14 17   ALT 25 17 14  14 22   ALKPHOS 113 75 84  84 109  BILITOT 0.6 0.8 0.4 0.5  PROT 6.8 5.3* 5.8  --   ALBUMIN 3.8 2.8* 3.4 4.1   Recent Labs    03/29/17 1240 03/30/17 0337 03/31/17 0602  04/18/17 08/08/17 10/21/17  WBC 13.9* 6.4 6.7  --  6.5  6.5 8.6 10.2  10.2  NEUTROABS 12.6*  --   --   --   --   --   --   HGB 14.3 11.4* 11.0*   < > 12.1*  12.1 14.3 13.2*  13.2  HCT 43.0 34.3* 33.7*   < > 35*  35.3 40.3 39*  38.8  MCV 96.2 98.3 98.5  --   --  93.1  --   PLT 129* 100* 90*  --  131*  --  180   < > = values in this interval not displayed.   Lab Results  Component Value Date   TSH 2.61 08/08/2017   Lab Results  Component Value Date   HGBA1C 7.7 12/03/2017   Lab Results  Component Value Date   CHOL 150 09/05/2017   HDL 49 01/31/2017   LDLCALC 89 09/05/2017   TRIG 113 09/05/2017    Significant Diagnostic Results in last 30 days:  No results found.  Assessment/Plan 1. Chronic combined systolic and diastolic congestive heart failure (HCC) No abrupt weight gain.Lungs CTA.Bilateral lower extremities trace -1+ edema.continue on Furosemide 20 mg tablet daily.continue to monitor weight.  2. Type 2 diabetes mellitus,goal Hgb A1C <8% Lab Results  Component Value Date   HGBA1C 7.7 12/03/2017  CBG readings reviewed have improved from previous visit though averages >200's no 300's noted.Recommended NCS diet per facility Nurse notes POA stated patient is a picky eater will try to order sugar free jello and ice cream. Continue on metformin and Januvia daily. Add Lantus 10 units SQ daily at bedtime. Monitor Hgb A1C.Up to date on annual eye and foot exam.On ACE inhibitor for renal protections and Statin for secondary stroke prevention.Off ASA due to high risk for falls.     3. Hyperlipidemia associated with type 2 diabetes  mellitus ([place]) Lab Results  Component Value Date   CHOL 150 09/05/2017   HDL 49 01/31/2017   LDLCALC 89 09/05/2017   TRIG 113 09/05/2017  Continue on simvastatin 5 mg tablet daily.  4. Alzheimer's dementia with behavioral disturbance, unspecified timing of dementia onset ([place]) No new behavioral issues reported.continue on memantine,Aricept and supportive care.   Family/ staff Communication: Reviewed plan of care with patient and facility Nurse.   Labs/tests ordered: None   Dinah C [name], NP

## 2018-04-03 DIAGNOSIS — M6281 Muscle weakness (generalized): Secondary | ICD-10-CM | POA: Diagnosis not present

## 2018-04-03 DIAGNOSIS — R1312 Dysphagia, oropharyngeal phase: Secondary | ICD-10-CM | POA: Diagnosis not present

## 2018-04-03 DIAGNOSIS — E118 Type 2 diabetes mellitus with unspecified complications: Secondary | ICD-10-CM | POA: Diagnosis not present

## 2018-04-03 DIAGNOSIS — Z7409 Other reduced mobility: Secondary | ICD-10-CM | POA: Diagnosis not present

## 2018-04-04 DIAGNOSIS — Z7409 Other reduced mobility: Secondary | ICD-10-CM | POA: Diagnosis not present

## 2018-04-04 DIAGNOSIS — R1312 Dysphagia, oropharyngeal phase: Secondary | ICD-10-CM | POA: Diagnosis not present

## 2018-04-07 DIAGNOSIS — E119 Type 2 diabetes mellitus without complications: Secondary | ICD-10-CM | POA: Diagnosis not present

## 2018-04-07 DIAGNOSIS — Z794 Long term (current) use of insulin: Secondary | ICD-10-CM | POA: Diagnosis not present

## 2018-04-07 DIAGNOSIS — J690 Pneumonitis due to inhalation of food and vomit: Secondary | ICD-10-CM | POA: Diagnosis not present

## 2018-04-07 DIAGNOSIS — I5022 Chronic systolic (congestive) heart failure: Secondary | ICD-10-CM | POA: Diagnosis not present

## 2018-04-07 DIAGNOSIS — R1312 Dysphagia, oropharyngeal phase: Secondary | ICD-10-CM | POA: Diagnosis not present

## 2018-04-07 DIAGNOSIS — Z7409 Other reduced mobility: Secondary | ICD-10-CM | POA: Diagnosis not present

## 2018-04-07 LAB — CBC AND DIFFERENTIAL
HCT: 30 — AB (ref 41–53)
Hemoglobin: 9.4 — AB (ref 13.5–17.5)
Platelets: 161 (ref 150–399)
WBC: 7.6

## 2018-04-08 ENCOUNTER — Encounter: Payer: Self-pay | Admitting: Nurse Practitioner

## 2018-04-08 ENCOUNTER — Non-Acute Institutional Stay (SKILLED_NURSING_FACILITY): Payer: Medicare Other | Admitting: Nurse Practitioner

## 2018-04-08 DIAGNOSIS — G309 Alzheimer's disease, unspecified: Secondary | ICD-10-CM | POA: Diagnosis not present

## 2018-04-08 DIAGNOSIS — F0281 Dementia in other diseases classified elsewhere with behavioral disturbance: Secondary | ICD-10-CM | POA: Diagnosis not present

## 2018-04-08 DIAGNOSIS — D509 Iron deficiency anemia, unspecified: Secondary | ICD-10-CM | POA: Insufficient documentation

## 2018-04-08 DIAGNOSIS — D649 Anemia, unspecified: Secondary | ICD-10-CM

## 2018-04-08 DIAGNOSIS — R1312 Dysphagia, oropharyngeal phase: Secondary | ICD-10-CM | POA: Diagnosis not present

## 2018-04-08 DIAGNOSIS — Z7409 Other reduced mobility: Secondary | ICD-10-CM | POA: Diagnosis not present

## 2018-04-08 NOTE — Assessment & Plan Note (Addendum)
04/07/18 wbc 7.6, Hgb 9.4, plt 161, will update  CMP, CBC Fe, Fe Sat, TIBC, Ferritin, FOBTx3, Vit B12, Folate, Retic count 04/10/18 wbc 10.2, Hgb 10.2, plt 210, Iron 32, ferritin 15, Retic count 1.6%, Na 144, K 4.3, Bun 24, creat 1.28 04/11/18 Fe 325mg  qd, CBC in one month

## 2018-04-08 NOTE — Assessment & Plan Note (Signed)
Continue SNF [facility] for safety and care assistance, continue Memantine 28mg  qd, Donepezil 23mg  qd.

## 2018-04-08 NOTE — Progress Notes (Addendum)
Location:  [place] Room Number: 36 Place of Service:  SNF ([phone]) Provider:  Marlana Latus  NP  [name], MD  Patient Care Team: [name], MD as PCP - General (Internal Medicine) [name], MD as PCP - Cardiology (Cardiology) Corinna Burkman X, NP as Nurse Practitioner (Internal Medicine)  Extended Emergency Contact Information Primary Emergency Contact: [name] Address: [place], [place] Phone: [phone] Mobile Phone: [phone] Relation: Spouse Secondary Emergency Contact: [name] [place] of [place] Phone: [phone] Work Phone: [phone] Relation: Daughter  Code Status:  Full Code Goals of care: Advanced Directive information Advanced Directives 04/08/2018  Does Patient Have a Medical Advance Directive? Yes  Type of Paramedic of [place];Living will  Does patient want to make changes to medical advance directive? No - Patient declined  Copy of [place] in Chart? Yes - validated most recent copy scanned in chart (See row information)  Would patient like information on creating a medical advance directive? -  Pre-existing out of facility DNR order (yellow form or pink MOST form) -     Chief Complaint  Patient presents with  . Acute Visit    low hemoglobin    HPI:  Pt is a 85 y.o. male seen today for an acute visit for anemia, Hgb 13.2 10/21/17, then 04/07/18 wbc 7.6, Hgb 9.4, plt 161. No s/s of bleeding, on Folate 438mcg qd. Marland Kitchen HPI was provided with assistance of staff, resides in SNF [facility] for safety and care assistance, on Memantine 28mg  qd, Donepezil 23mg  qd.    Past Medical History:  Diagnosis Date  . Abnormality of gait   . Backache, unspecified   . CHF (congestive heart failure) ([place])   . Colon polyps    adenomatous  . Dementia ([place]) 11/12/2012  . Depression   . Diabetes mellitus without complication  ([place])   . Diverticulosis   . Essential and other specified forms of tremor   . Hemorrhoids   . History of bilateral hip replacements 11/12/2012  . Memory loss   . 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 Pain in joint, pelvic region and thigh   . Skin cancer of scalp   . Spinal stenosis, lumbar region, without neurogenic claudication   . Unspecified hereditary and idiopathic peripheral neuropathy    Past Surgical History:  Procedure Laterality Date  . CATARACT EXTRACTION, BILATERAL  2012  . JOINT REPLACEMENT    . RETINAL LASER PROCEDURE  2012   retinal wrinkle  . SKIN CANCER EXCISION    . TOTAL HIP ARTHROPLASTY Left 2000  . TOTAL HIP ARTHROPLASTY (aka REPLACEMENT) Right 2011    Allergies  Allergen Reactions  . Axona [Bacid]     Unknown per MAR  . Gabapentin     Hallucinations    Outpatient Encounter Medications as of 04/08/2018  Medication Sig  . buPROPion (WELLBUTRIN SR) 100 MG 12 hr tablet Take 100 mg by mouth daily.  . carvedilol (COREG) 3.125 MG tablet Take 1 tablet (3.125 mg total) by mouth 2 (two) times daily with a meal.  . Cholecalciferol (VITAMIN D) 1000 UNITS capsule Take 1,000 Units by mouth daily.    Marland Kitchen docusate sodium (COLACE) 100 MG capsule Take 100 mg by mouth daily.  Marland Kitchen donepezil (ARICEPT) 23 MG TABS tablet Take 1 tablet (23 mg total) by mouth  daily.  . dorzolamide-timolol (COSOPT) 22.3-6.8 MG/ML ophthalmic solution Place 1 drop into the left eye 2 (two) times daily.   . Emollient (CERAVE) CREA Apply thin layer topically to arms, legs, and trunk once daily as needed  . Flaxseed, Linseed, (FLAXSEED OIL) 1000 MG CAPS Take 1,000 mg by mouth daily.    . folic acid (FOLVITE) 485 MCG tablet Take 800 mcg by mouth every evening.   . furosemide (LASIX) 20 MG tablet Take 1 tablet (20 mg total) by mouth daily.  . hydrocortisone (ANUSOL-HC) 2.5 % rectal cream Place 1 application rectally at bedtime as needed for hemorrhoids or anal  itching.  . insulin glargine (LANTUS) 100 UNIT/ML injection Inject 10 Units into the skin daily.  Marland Kitchen lisinopril (PRINIVIL,ZESTRIL) 5 MG tablet Take 1 tablet (5 mg total) by mouth daily.  . memantine (NAMENDA XR) 28 MG CP24 24 hr capsule Take 1 capsule (28 mg total) by mouth daily.  . metFORMIN (GLUCOPHAGE) 500 MG tablet Take 500 mg by mouth 2 (two) times daily with a meal.  . Multiple Vitamin (MULTIVITAMIN) tablet Take 1 tablet by mouth daily.    Marland Kitchen nystatin cream (MYCOSTATIN) Apply 1 application topically 2 (two) times daily as needed for dry skin. Apply to redness on  scrotum/sacral areas  . Probiotic Product (ALIGN) 4 MG CAPS Take 4 mg by mouth daily.  . simvastatin (ZOCOR) 5 MG tablet Take 5 mg by mouth daily.  . sitaGLIPtin (JANUVIA) 25 MG tablet Take 1 tablet (25 mg total) by mouth daily.  Marland Kitchen zinc oxide 20 % ointment Apply 1 application topically. Every shift apply to perineal area due to redness  . zinc oxide 20 % ointment Apply 1 application topically 3 (three) times daily as needed for irritation.   No facility-administered encounter medications on file as of 04/08/2018.    ROS was provided with assistance of staff Review of Systems  Constitutional: Negative for activity change, appetite change, chills, diaphoresis, fatigue, fever and unexpected weight change.  HENT: Positive for hearing loss. Negative for congestion and voice change.   Respiratory: Negative for cough, shortness of breath and wheezing.   Cardiovascular: Positive for leg swelling. Negative for chest pain and palpitations.  Gastrointestinal: Negative for abdominal distention, abdominal pain, constipation, diarrhea, nausea and vomiting.  Genitourinary: Negative for difficulty urinating, dysuria and urgency.  Musculoskeletal: Positive for arthralgias and gait problem.  Skin: Negative for color change and pallor.  Neurological: Negative for dizziness, speech difficulty, weakness and headaches.       Dementia    Psychiatric/Behavioral: Positive for confusion. Negative for agitation, behavioral problems, hallucinations and sleep disturbance. The patient is not nervous/anxious.     Immunization History  Administered Date(s) Administered  . Influenza-Unspecified 01/10/2017, 01/13/2018  . PPD Test 06/24/2016  . Zoster Recombinat (Shingrix) 06/10/2017, 08/12/2017   Pertinent  Health Maintenance Due  Topic Date Due  . OPHTHALMOLOGY EXAM  06/06/1940  . HEMOGLOBIN A1C  06/03/2018  . FOOT EXAM  08/07/2018  . INFLUENZA VACCINE  Completed  . PNA vac Low Risk Adult  Completed   Fall Risk  12/13/2017 10/14/2017 12/07/2016 01/19/2016 07/21/2015  Falls in the past year? No No No Yes Yes  Number falls in past yr: - - - 1 1  Injury with Fall? - - - No No  Risk for fall due to : - Impaired balance/gait - Impaired balance/gait -  Follow up - - - Falls prevention discussed Falls prevention discussed   Functional Status Survey:    Vitals:  04/08/18 1231  BP: 119/79  Pulse: 80  Resp: 20  Temp: 97.9 F (36.6 C)  SpO2: 98%  Weight: 177 lb 14.4 oz (80.7 kg)  Height: 6\' 1"  (1.854 m)   Body mass index is 23.47 kg/m. Physical Exam Constitutional:      General: He is not in acute distress.    Appearance: Normal appearance. He is normal weight. He is not ill-appearing, toxic-appearing or diaphoretic.  HENT:     Head: Normocephalic and atraumatic.     Nose: Nose normal.     Mouth/Throat:     Mouth: Mucous membranes are moist.  Eyes:     Extraocular Movements: Extraocular movements intact.     Pupils: Pupils are equal, round, and reactive to light.  Neck:     Musculoskeletal: Normal range of motion and neck supple.  Cardiovascular:     Rate and Rhythm: Normal rate and regular rhythm.     Heart sounds: No murmur.  Pulmonary:     Effort: Pulmonary effort is normal.     Breath sounds: Normal breath sounds. No wheezing, rhonchi or rales.  Abdominal:     General: There is no distension.      Palpations: Abdomen is soft.     Tenderness: There is no abdominal tenderness. There is no guarding or rebound.  Musculoskeletal: Normal range of motion.     Right lower leg: Edema present.     Left lower leg: Edema present.     Comments: Trace edema  Skin:    General: Skin is warm and dry.     Coloration: Skin is not pale.     Findings: No erythema or rash.  Neurological:     General: No focal deficit present.     Mental Status: He is alert. Mental status is at baseline.     Comments: Oriented to self only  Psychiatric:        Mood and Affect: Mood normal.        Behavior: Behavior normal.     Labs reviewed: Recent Labs    08/08/17 10/21/17 11/14/17  NA 137 139  139 140  140  K 4.0 3.9  3.9 4.0  4.0  BUN 19 21 20  20   CREATININE 0.97 1.2  1.21 0.93  0.93  CALCIUM 9.5 9.4 8.5  8.5   Recent Labs    04/18/17 08/08/17  AST 14  14 17   ALT 14  14 22   ALKPHOS 84  84 109  BILITOT 0.4 0.5  PROT 5.8  --   ALBUMIN 3.4 4.1   Recent Labs    04/18/17 08/08/17 10/21/17  WBC 6.5  6.5 8.6 10.2  10.2  HGB 12.1*  12.1 14.3 13.2*  13.2  HCT 35*  35.3 40.3 39*  38.8  MCV  --  93.1  --   PLT 131*  --  180   Lab Results  Component Value Date   TSH 2.61 08/08/2017   Lab Results  Component Value Date   HGBA1C 7.7 12/03/2017   Lab Results  Component Value Date   CHOL 150 09/05/2017   HDL 49 01/31/2017   LDLCALC 89 09/05/2017   TRIG 113 09/05/2017    Significant Diagnostic Results in last 30 days:  No results found.  Assessment/Plan Iron deficiency anemia 04/07/18 wbc 7.6, Hgb 9.4, plt 161, will update  CMP, CBC Fe, Fe Sat, TIBC, Ferritin, FOBTx3, Vit B12, Folate, Retic count 04/10/18 wbc 10.2, Hgb 10.2, plt 210, Iron 32, ferritin 15,  Retic count 1.6%, Na 144, K 4.3, Bun 24, creat 1.28 04/11/18 Fe 325mg  qd, CBC in one month    Alzheimer disease Continue SNF [facility] for safety and care assistance, continue Memantine 28mg  qd, Donepezil 23mg  qd.      Family/  staff Communication: plan of care reviewed with the patient and charge nurse.   Labs/tests ordered:  CBC Fe, Fe Sat, TIBC, Ferritin, FOBTx3, Vit B12, Folate, Retic count, CMP

## 2018-04-10 DIAGNOSIS — R3981 Functional urinary incontinence: Secondary | ICD-10-CM | POA: Diagnosis not present

## 2018-04-10 DIAGNOSIS — A408 Other streptococcal sepsis: Secondary | ICD-10-CM | POA: Diagnosis not present

## 2018-04-10 DIAGNOSIS — Z7409 Other reduced mobility: Secondary | ICD-10-CM | POA: Diagnosis not present

## 2018-04-10 DIAGNOSIS — R1312 Dysphagia, oropharyngeal phase: Secondary | ICD-10-CM | POA: Diagnosis not present

## 2018-04-10 DIAGNOSIS — M6281 Muscle weakness (generalized): Secondary | ICD-10-CM | POA: Diagnosis not present

## 2018-04-10 LAB — CBC AND DIFFERENTIAL
HCT: 33 — AB (ref 41–53)
Hemoglobin: 10.2 — AB (ref 13.5–17.5)
Platelets: 210 (ref 150–399)
WBC: 10.2

## 2018-04-10 LAB — IRON,TIBC AND FERRITIN PANEL
Ferritin: 15
Iron: 32

## 2018-04-10 LAB — BASIC METABOLIC PANEL
BUN: 24 — AB (ref 4–21)
Creatinine: 1.3 (ref <=1.3)
GLUCOSE: 112
POTASSIUM: 4.3 (ref 3.4–5.3)
SODIUM: 144 (ref 137–147)

## 2018-04-10 LAB — HEPATIC FUNCTION PANEL
ALK PHOS: 93 (ref 25–125)
ALT: 13 (ref 10–40)
AST: 14 (ref 14–40)
Bilirubin, Total: 0.2

## 2018-04-11 ENCOUNTER — Encounter: Payer: Self-pay | Admitting: Nurse Practitioner

## 2018-04-11 ENCOUNTER — Other Ambulatory Visit: Payer: Self-pay | Admitting: *Deleted

## 2018-04-11 DIAGNOSIS — R1312 Dysphagia, oropharyngeal phase: Secondary | ICD-10-CM | POA: Diagnosis not present

## 2018-04-11 DIAGNOSIS — Z7409 Other reduced mobility: Secondary | ICD-10-CM | POA: Diagnosis not present

## 2018-04-11 LAB — COMPLETE METABOLIC PANEL WITH GFR
Albumin: 3.7
Calcium: 8.9
Carbon Dioxide, Total: 22
Chloride: 110
GFR CALC NON AF AMER: 51
Globulin: 2.7
RETIC CT ABS: 61440
Reticulocyte Count: 1.6
TOTAL PROTEIN: 6.4 g/dL

## 2018-04-15 ENCOUNTER — Non-Acute Institutional Stay (SKILLED_NURSING_FACILITY): Payer: Medicare Other | Admitting: Nurse Practitioner

## 2018-04-15 ENCOUNTER — Encounter: Payer: Self-pay | Admitting: Nurse Practitioner

## 2018-04-15 DIAGNOSIS — K5900 Constipation, unspecified: Secondary | ICD-10-CM | POA: Insufficient documentation

## 2018-04-15 DIAGNOSIS — F0281 Dementia in other diseases classified elsewhere with behavioral disturbance: Secondary | ICD-10-CM

## 2018-04-15 DIAGNOSIS — L309 Dermatitis, unspecified: Secondary | ICD-10-CM | POA: Diagnosis not present

## 2018-04-15 DIAGNOSIS — D509 Iron deficiency anemia, unspecified: Secondary | ICD-10-CM

## 2018-04-15 DIAGNOSIS — I5042 Chronic combined systolic (congestive) and diastolic (congestive) heart failure: Secondary | ICD-10-CM | POA: Diagnosis not present

## 2018-04-15 DIAGNOSIS — R1312 Dysphagia, oropharyngeal phase: Secondary | ICD-10-CM | POA: Diagnosis not present

## 2018-04-15 DIAGNOSIS — F418 Other specified anxiety disorders: Secondary | ICD-10-CM

## 2018-04-15 DIAGNOSIS — E1122 Type 2 diabetes mellitus with diabetic chronic kidney disease: Secondary | ICD-10-CM

## 2018-04-15 DIAGNOSIS — I10 Essential (primary) hypertension: Secondary | ICD-10-CM

## 2018-04-15 DIAGNOSIS — G309 Alzheimer's disease, unspecified: Secondary | ICD-10-CM | POA: Diagnosis not present

## 2018-04-15 DIAGNOSIS — N183 Chronic kidney disease, stage 3 unspecified: Secondary | ICD-10-CM

## 2018-04-15 DIAGNOSIS — Z7409 Other reduced mobility: Secondary | ICD-10-CM | POA: Diagnosis not present

## 2018-04-15 NOTE — Progress Notes (Signed)
Location:  [place] Room Number: 36 Place of Service:  SNF ([phone]) Provider:  Marlana Latus  NP  [name], MD  Patient Care Team: [name], MD as PCP - General (Internal Medicine) [name], MD as PCP - Cardiology (Cardiology) Tamberlyn Midgley X, NP as Nurse Practitioner (Internal Medicine)  Extended Emergency Contact Information Primary Emergency Contact: [name] Address: [place], [place] Phone: [phone] Mobile Phone: [phone] Relation: Spouse Secondary Emergency Contact: [name] States of [place] Phone: [phone] Work Phone: [phone] Relation: Daughter  Code Status:  Full Code Goals of care: Advanced Directive information Advanced Directives 04/08/2018  Does Patient Have a Medical Advance Directive? Yes  Type of Paramedic of [place];Living will  Does patient want to make changes to medical advance directive? No - Patient declined  Copy of [place] in Chart? Yes - validated most recent copy scanned in chart (See row information)  Would patient like information on creating a medical advance directive? -  Pre-existing out of facility DNR order (yellow form or pink MOST form) -     Chief Complaint  Patient presents with  . Medical Management of Chronic Issues    HPI:  Pt is a 85 y.o. male seen today for medical management of chronic diseases.     The patient has history of T2DM, on Januvia 25mg  qd, Metformin 500mg  bid, Lantus 10 u qd, last Hgb a1c 7.7 12/03/17.  Resides in SNF [facility] for safety and care assistance, on Memantine 28mg  qd, Donepezil 23mg  qd. . HTN, blood pressure is controlled on Carvedilol 3.15mg  bid, Lisinopril 5mg  qd, Furosemide 20mg  qd. Anemia, Hgb 9s, on Fe, Folic acid. No constipation while on Colace 100mg  qd. His mood is stable on Wellbutrin 100mg  qd.  Past Medical History:  Diagnosis Date    . Abnormality of gait   . Backache, unspecified   . CHF (congestive heart failure) ([place])   . Colon polyps    adenomatous  . Dementia ([place]) 11/12/2012  . Depression   . Diabetes mellitus without complication ([place])   . Diverticulosis   . Essential and other specified forms of tremor   . Hemorrhoids   . History of bilateral hip replacements 11/12/2012  . Memory loss   . 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 Pain in joint, pelvic region and thigh   . Skin cancer of scalp   . Spinal stenosis, lumbar region, without neurogenic claudication   . Unspecified hereditary and idiopathic peripheral neuropathy    Past Surgical History:  Procedure Laterality Date  . CATARACT EXTRACTION, BILATERAL  2012  . JOINT REPLACEMENT    . RETINAL LASER PROCEDURE  2012   retinal wrinkle  . SKIN CANCER EXCISION    . TOTAL HIP ARTHROPLASTY Left 2000  . TOTAL HIP ARTHROPLASTY (aka REPLACEMENT) Right 2011    Allergies  Allergen Reactions  . Axona [Bacid]     Unknown per MAR  . Gabapentin     Hallucinations    Outpatient Encounter Medications as of 04/15/2018  Medication Sig  . buPROPion (WELLBUTRIN SR) 100 MG 12 hr tablet Take 100 mg by mouth daily.  . carvedilol (COREG) 3.125 MG tablet Take 1 tablet (3.125 mg total) by mouth 2 (two) times daily with a meal.  . Cholecalciferol (VITAMIN D) 1000 UNITS capsule Take 1,000 Units  by mouth daily.    Marland Kitchen docusate sodium (COLACE) 100 MG capsule Take 100 mg by mouth daily.  Marland Kitchen donepezil (ARICEPT) 23 MG TABS tablet Take 1 tablet (23 mg total) by mouth daily.  . dorzolamide-timolol (COSOPT) 22.3-6.8 MG/ML ophthalmic solution Place 1 drop into the left eye 2 (two) times daily.   . Emollient (CERAVE) CREA Apply thin layer topically to arms, legs, and trunk once daily as needed  . ferrous sulfate 325 (65 FE) MG tablet Take 325 mg by mouth daily with breakfast.  . Flaxseed, Linseed, (FLAXSEED OIL) 1000 MG CAPS Take 1,000 mg by  mouth daily.    . folic acid (FOLVITE) 035 MCG tablet Take 800 mcg by mouth every evening.   . furosemide (LASIX) 20 MG tablet Take 1 tablet (20 mg total) by mouth daily.  . hydrocortisone (ANUSOL-HC) 2.5 % rectal cream Place 1 application rectally at bedtime as needed for hemorrhoids or anal itching.  . insulin glargine (LANTUS) 100 UNIT/ML injection Inject 10 Units into the skin at bedtime.   Marland Kitchen lisinopril (PRINIVIL,ZESTRIL) 5 MG tablet Take 1 tablet (5 mg total) by mouth daily.  . memantine (NAMENDA XR) 28 MG CP24 24 hr capsule Take 1 capsule (28 mg total) by mouth daily.  . metFORMIN (GLUCOPHAGE) 500 MG tablet Take 500 mg by mouth 2 (two) times daily with a meal.  . Multiple Vitamin (MULTIVITAMIN) tablet Take 1 tablet by mouth daily.    Marland Kitchen nystatin cream (MYCOSTATIN) Apply 1 application topically 2 (two) times daily as needed for dry skin. Apply to redness on  scrotum/sacral areas  . Probiotic Product (ALIGN) 4 MG CAPS Take 4 mg by mouth daily.  . simvastatin (ZOCOR) 5 MG tablet Take 5 mg by mouth daily.  . sitaGLIPtin (JANUVIA) 25 MG tablet Take 1 tablet (25 mg total) by mouth daily.  Marland Kitchen zinc oxide 20 % ointment Apply 1 application topically. Every shift apply to perineal area due to redness  . zinc oxide 20 % ointment Apply 1 application topically 3 (three) times daily as needed for irritation.   No facility-administered encounter medications on file as of 04/15/2018.    ROS was provided with assistance of staff Review of Systems  Constitutional: Negative for activity change, appetite change, chills, diaphoresis, fatigue, fever and unexpected weight change.       Same weight comparing to 04/2017  HENT: Positive for hearing loss and trouble swallowing. Negative for congestion and voice change.   Respiratory: Negative for cough and shortness of breath.   Cardiovascular: Positive for leg swelling. Negative for chest pain and palpitations.  Gastrointestinal: Negative for abdominal distention,  abdominal pain, constipation, diarrhea and vomiting.  Genitourinary: Negative for difficulty urinating, dysuria, frequency and urgency.       Incontinent of urine.   Musculoskeletal: Positive for arthralgias, back pain and gait problem.  Skin: Positive for rash. Negative for color change and pallor.       Scaly dry macules BLE  Neurological: Negative for dizziness, speech difficulty, weakness, numbness and headaches.       Dementia.   Psychiatric/Behavioral: Negative for agitation, behavioral problems, hallucinations and sleep disturbance. The patient is not nervous/anxious.     Immunization History  Administered Date(s) Administered  . Influenza-Unspecified 01/10/2017, 01/13/2018  . PPD Test 06/24/2016  . Zoster Recombinat (Shingrix) 06/10/2017, 08/12/2017   Pertinent  Health Maintenance Due  Topic Date Due  . OPHTHALMOLOGY EXAM  06/06/1940  . HEMOGLOBIN A1C  06/03/2018  . FOOT EXAM  08/07/2018  .  INFLUENZA VACCINE  Completed  . PNA vac Low Risk Adult  Completed   Fall Risk  12/13/2017 10/14/2017 12/07/2016 01/19/2016 07/21/2015  Falls in the past year? No No No Yes Yes  Number falls in past yr: - - - 1 1  Injury with Fall? - - - No No  Risk for fall due to : - Impaired balance/gait - Impaired balance/gait -  Follow up - - - Falls prevention discussed Falls prevention discussed   Functional Status Survey:    Vitals:   04/15/18 1344  BP: 127/88  Pulse: 89  Resp: 18  Temp: 97.8 F (36.6 C)  SpO2: 98%  Weight: 175 lb 1.6 oz (79.4 kg)  Height: 6\' 1"  (1.854 m)   Body mass index is 23.1 kg/m. Physical Exam Constitutional:      General: He is not in acute distress.    Appearance: Normal appearance. He is normal weight. He is not ill-appearing, toxic-appearing or diaphoretic.  HENT:     Head: Normocephalic and atraumatic.     Nose: Nose normal. No congestion or rhinorrhea.     Mouth/Throat:     Mouth: Mucous membranes are moist.  Eyes:     Extraocular Movements:  Extraocular movements intact.     Pupils: Pupils are equal, round, and reactive to light.  Neck:     Musculoskeletal: Normal range of motion and neck supple.  Cardiovascular:     Rate and Rhythm: Normal rate and regular rhythm.     Heart sounds: No murmur.  Pulmonary:     Effort: Pulmonary effort is normal.     Breath sounds: No wheezing, rhonchi or rales.  Abdominal:     General: There is no distension.     Palpations: Abdomen is soft.     Tenderness: There is no abdominal tenderness. There is no guarding or rebound.  Musculoskeletal:     Right lower leg: Edema present.     Left lower leg: Edema present.     Comments: Trace edema BLE  Skin:    General: Skin is warm and dry.     Coloration: Skin is not pale.     Findings: Rash present. No erythema.     Comments: Scaly dry macules BLE  Neurological:     General: No focal deficit present.     Mental Status: He is alert. Mental status is at baseline.     Cranial Nerves: No cranial nerve deficit.     Motor: No weakness.     Coordination: Coordination normal.     Gait: Gait abnormal.     Comments: Oriented to self.   Psychiatric:        Mood and Affect: Mood normal.        Behavior: Behavior normal.     Labs reviewed: Recent Labs    10/21/17 11/14/17 04/10/18  NA 139  139 140  140 144  K 3.9  3.9 4.0  4.0 4.3  CL  --   --  110  CO2  --   --  22  BUN 21 20  20  24*  CREATININE 1.2  1.21 0.93  0.93 1.3  CALCIUM 9.4 8.5  8.5 8.9   Recent Labs    04/18/17 08/08/17 04/10/18  AST 14  14 17 14   ALT 14  14 22 13   ALKPHOS 84  84 109 93  BILITOT 0.4 0.5  --   PROT 5.8  --  6.4  ALBUMIN 3.4 4.1 3.7   Recent Labs  04/18/17 08/08/17 10/21/17 04/10/18  WBC 6.5  6.5 8.6 10.2  10.2 10.2  HGB 12.1*  12.1 14.3 13.2*  13.2 10.2*  HCT 35*  35.3 40.3 39*  38.8 33*  MCV  --  93.1  --   --   PLT 131*  --  180 210   Lab Results  Component Value Date   TSH 2.61 08/08/2017   Lab Results  Component Value Date     HGBA1C 7.7 12/03/2017   Lab Results  Component Value Date   CHOL 150 09/05/2017   HDL 49 01/31/2017   LDLCALC 89 09/05/2017   TRIG 113 09/05/2017    Significant Diagnostic Results in last 30 days:  No results found.  Assessment/Plan Chronic combined systolic and diastolic congestive heart failure ([place]) Compensated clinically, continue Furosemide 20mg  qd.   Type 2 diabetes mellitus with diabetic chronic kidney disease ([place]) Last Hgb a1c 7.7 12/03/17, continue Januvia 25mg  qd, Metformin 500mg  bid, Lantus 10 u qd.   Alzheimer disease Continue SNF [facility] for safety and care assistance, continue Memantine 28mg  qd, Donepezil 23mg  qd.   Dermatitis Dry skin dermatitis BLE, apply 1% hydrocortisone lotion daily x 2 weeks.   Depression with anxiety Mood is stable, continue Wellbutrin 100mg  qd.   Iron deficiency anemia 04/10/18 wbc 10.2, Hgb 10.2, plt 210, Iron 32, ferritin 15, Retic count 1.6%, Na 144, K 4.3, Bun 24, creat 1.28 04/11/18 Fe 325mg  qd, continue Folic acid.  CBC in one month   Constipation Stable, continue Colace 100mg  qd.   HTN (hypertension) Blood pressure is controlled, continue Carvedilol 3.15mg  bid, Lisinopril 5mg  qd, Furosmeide 20mg  qd.      Family/ staff Communication: plan of care reviewed with the patient and charge nurse.   Labs/tests ordered:  none  Time spend 25 minutes.

## 2018-04-15 NOTE — Assessment & Plan Note (Signed)
Dry skin dermatitis BLE, apply 1% hydrocortisone lotion daily x 2 weeks.

## 2018-04-15 NOTE — Assessment & Plan Note (Signed)
Blood pressure is controlled, continue Carvedilol 3.15mg  bid, Lisinopril 5mg  qd, Furosmeide 20mg  qd.

## 2018-04-15 NOTE — Assessment & Plan Note (Signed)
Continue SNF [facility] for safety and care assistance, continue Memantine 28mg  qd, Donepezil 23mg  qd.

## 2018-04-15 NOTE — Assessment & Plan Note (Signed)
Stable, continue Colace 100mg qd.  

## 2018-04-15 NOTE — Assessment & Plan Note (Signed)
Last Hgb a1c 7.7 12/03/17, continue Januvia 25mg  qd, Metformin 500mg  bid, Lantus 10 u qd.

## 2018-04-15 NOTE — Assessment & Plan Note (Signed)
Mood is stable, continue Wellbutrin 100mg  qd.

## 2018-04-15 NOTE — Assessment & Plan Note (Signed)
Compensated clinically, continue Furosemide 20mg qd.  

## 2018-04-15 NOTE — Assessment & Plan Note (Signed)
04/10/18 wbc 10.2, Hgb 10.2, plt 210, Iron 32, ferritin 15, Retic count 1.6%, Na 144, K 4.3, Bun 24, creat 1.28 04/11/18 Fe 325mg  qd, continue Folic acid.  CBC in one month

## 2018-04-16 DIAGNOSIS — Z7409 Other reduced mobility: Secondary | ICD-10-CM | POA: Diagnosis not present

## 2018-04-16 DIAGNOSIS — R1312 Dysphagia, oropharyngeal phase: Secondary | ICD-10-CM | POA: Diagnosis not present

## 2018-04-18 DIAGNOSIS — Z7409 Other reduced mobility: Secondary | ICD-10-CM | POA: Diagnosis not present

## 2018-04-18 DIAGNOSIS — R1312 Dysphagia, oropharyngeal phase: Secondary | ICD-10-CM | POA: Diagnosis not present

## 2018-04-21 DIAGNOSIS — R1312 Dysphagia, oropharyngeal phase: Secondary | ICD-10-CM | POA: Diagnosis not present

## 2018-04-21 DIAGNOSIS — Z7409 Other reduced mobility: Secondary | ICD-10-CM | POA: Diagnosis not present

## 2018-04-22 ENCOUNTER — Non-Acute Institutional Stay (SKILLED_NURSING_FACILITY): Payer: Medicare Other | Admitting: Nurse Practitioner

## 2018-04-22 ENCOUNTER — Encounter: Payer: Self-pay | Admitting: Nurse Practitioner

## 2018-04-22 DIAGNOSIS — I10 Essential (primary) hypertension: Secondary | ICD-10-CM

## 2018-04-22 DIAGNOSIS — R05 Cough: Secondary | ICD-10-CM

## 2018-04-22 DIAGNOSIS — G309 Alzheimer's disease, unspecified: Secondary | ICD-10-CM | POA: Diagnosis not present

## 2018-04-22 DIAGNOSIS — I5042 Chronic combined systolic (congestive) and diastolic (congestive) heart failure: Secondary | ICD-10-CM

## 2018-04-22 DIAGNOSIS — R1312 Dysphagia, oropharyngeal phase: Secondary | ICD-10-CM | POA: Diagnosis not present

## 2018-04-22 DIAGNOSIS — R059 Cough, unspecified: Secondary | ICD-10-CM

## 2018-04-22 DIAGNOSIS — F0281 Dementia in other diseases classified elsewhere with behavioral disturbance: Secondary | ICD-10-CM

## 2018-04-22 NOTE — Progress Notes (Signed)
Location:  [facility] Room Number: 36 Place of Service:  SNF ([phone]) Provider:  Elenora Fender, NP  [name], MD  Patient Care Team: [name], MD as PCP - General (Internal Medicine) [name], MD as PCP - Cardiology (Cardiology) Mast, Man X, NP as Nurse Practitioner (Internal Medicine)  Extended Emergency Contact Information Primary Emergency Contact: [name] Address: [place], [place] Phone: [phone] Mobile Phone: [phone] Relation: Spouse Secondary Emergency Contact: [name] [place] Phone: [phone] Work Phone: [phone] Relation: Daughter  Code Status: full code Goals of care: Advanced Directive information Advanced Directives 04/25/2018  Does Patient Have a Medical Advance Directive? Yes  Type of Paramedic of [place];Living will  Does patient want to make changes to medical advance directive? No - Patient declined  Copy of [place] in Chart? Yes - validated most recent copy scanned in chart (See row information)  Would patient like information on creating a medical advance directive? -  Pre-existing out of facility DNR order (yellow form or pink MOST form) -     Chief Complaint  Patient presents with  . Acute Visit    C/o- cough    HPI:  Pt is a 85 y.o. male seen today for an acute visit for the patient's spouse reported a cough for over a week and requested that he is seen by the doctor. He denied sputum production, headache, sore throat, chest pain/pressures, SOB, or palpitation. He is afebrile.  HPI was provided with assistance of staff. Hx of dysphagia, underwent speech therapy evaluation and recommendation. Hx of HTN, blood pressure is not controlled on Lisinopril 5mg  qd, Furosemide 20mg  qd, Coreg 3.125mg  bid. His memory is preserved on Memantine 28mg  qd, Donepezil 23mg  qd. CHF  compensated on Furosemide.    Past Medical History:  Diagnosis Date  . Abnormality of gait   . Backache, unspecified   . CHF (congestive heart failure) ([place])   . Colon polyps    adenomatous  . Dementia ([place]) 11/12/2012  . Depression   . Diabetes mellitus without complication ([place])   . Diverticulosis   . Essential and other specified forms of tremor   . Hemorrhoids   . History of bilateral hip replacements 11/12/2012  . Memory loss   . 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 Pain in joint, pelvic region and thigh   . Skin cancer of scalp   . Spinal stenosis, lumbar region, without neurogenic claudication   . Unspecified hereditary and idiopathic peripheral neuropathy    Past Surgical History:  Procedure Laterality Date  . CATARACT EXTRACTION, BILATERAL  2012  . JOINT REPLACEMENT    . RETINAL LASER PROCEDURE  2012   retinal wrinkle  . SKIN CANCER EXCISION    . TOTAL HIP ARTHROPLASTY Left 2000  . TOTAL HIP ARTHROPLASTY (aka REPLACEMENT) Right 2011    Allergies  Allergen Reactions  . Axona [Bacid]     Unknown per MAR  . Gabapentin     Hallucinations    Outpatient Encounter Medications as of 04/22/2018  Medication Sig  . buPROPion (WELLBUTRIN SR) 100 MG 12 hr tablet Take 100 mg by mouth daily.  . carvedilol (COREG) 3.125 MG tablet Take 1 tablet (3.125 mg total) by mouth 2 (two) times daily with a meal.  . Cholecalciferol (VITAMIN D) 1000 UNITS capsule  Take 1,000 Units by mouth daily.    Marland Kitchen docusate sodium (COLACE) 100 MG capsule Take 100 mg by mouth daily.  Marland Kitchen donepezil (ARICEPT) 23 MG TABS tablet Take 1 tablet (23 mg total) by mouth daily.  . dorzolamide-timolol (COSOPT) 22.3-6.8 MG/ML ophthalmic solution Place 1 drop into the left eye 2 (two) times daily.   . ferrous sulfate 325 (65 FE) MG tablet Take 325 mg by mouth daily with breakfast.  . Flaxseed, Linseed, (FLAXSEED OIL) 1000 MG CAPS Take 1,000 mg by mouth daily.    . folic acid  (FOLVITE) 409 MCG tablet Take 800 mcg by mouth every evening.   . furosemide (LASIX) 20 MG tablet Take 1 tablet (20 mg total) by mouth daily.  . hydrocortisone (ANUSOL-HC) 2.5 % rectal cream Place 1 application rectally at bedtime as needed for hemorrhoids or anal itching.  . insulin glargine (LANTUS) 100 UNIT/ML injection Inject 10 Units into the skin at bedtime.   Marland Kitchen lisinopril (PRINIVIL,ZESTRIL) 5 MG tablet Take 1 tablet (5 mg total) by mouth daily.  . memantine (NAMENDA XR) 28 MG CP24 24 hr capsule Take 1 capsule (28 mg total) by mouth daily.  . metFORMIN (GLUCOPHAGE) 500 MG tablet Take 500 mg by mouth 2 (two) times daily with a meal.  . Multiple Vitamin (MULTIVITAMIN) tablet Take 1 tablet by mouth daily.    . Probiotic Product (ALIGN) 4 MG CAPS Take 4 mg by mouth daily.  . simvastatin (ZOCOR) 5 MG tablet Take 5 mg by mouth daily.  . sitaGLIPtin (JANUVIA) 25 MG tablet Take 1 tablet (25 mg total) by mouth daily.  Marland Kitchen zinc oxide 20 % ointment Apply 1 application topically 3 (three) times daily as needed for irritation.  . [DISCONTINUED] zinc oxide 20 % ointment Apply 1 application topically. Every shift apply to perineal area due to redness  . Emollient (CERAVE) CREA Apply thin layer topically to arms, legs, and trunk once daily as needed  . nystatin cream (MYCOSTATIN) Apply 1 application topically 2 (two) times daily as needed for dry skin. Apply to redness on  scrotum/sacral areas   No facility-administered encounter medications on file as of 04/22/2018.    ROS was provided with assistance of staff Review of Systems  Constitutional: Negative for activity change, appetite change, chills, diaphoresis, fatigue and fever.  HENT: Positive for hearing loss and trouble swallowing. Negative for congestion, rhinorrhea, sinus pressure, sinus pain, sore throat and voice change.   Respiratory: Positive for cough. Negative for shortness of breath and wheezing.   Cardiovascular: Positive for leg swelling.  Negative for chest pain and palpitations.  Gastrointestinal: Negative for abdominal distention, abdominal pain, constipation, diarrhea, nausea and vomiting.  Genitourinary: Negative for difficulty urinating, dysuria and urgency.  Musculoskeletal: Positive for arthralgias and gait problem.  Neurological: Negative for dizziness, speech difficulty, weakness and headaches.       Dementia  Psychiatric/Behavioral: Negative for agitation, behavioral problems, hallucinations and sleep disturbance. The patient is not nervous/anxious.     Immunization History  Administered Date(s) Administered  . Influenza-Unspecified 01/10/2017, 01/13/2018  . PPD Test 06/24/2016  . Zoster Recombinat (Shingrix) 06/10/2017, 08/12/2017   Pertinent  Health Maintenance Due  Topic Date Due  . OPHTHALMOLOGY EXAM  06/06/1940  . HEMOGLOBIN A1C  06/03/2018  . FOOT EXAM  08/07/2018  . INFLUENZA VACCINE  Completed  . PNA vac Low Risk Adult  Completed   Fall Risk  12/13/2017 10/14/2017 12/07/2016 01/19/2016 07/21/2015  Falls in the past year? No No No Yes Yes  Number falls in past yr: - - - 1 1  Injury with Fall? - - - No No  Risk for fall due to : - Impaired balance/gait - Impaired balance/gait -  Follow up - - - Falls prevention discussed Falls prevention discussed   Functional Status Survey:    Vitals:   04/25/18 1015  BP: (!) 161/106  Pulse: (!) 118  Resp: 20  Temp: 97.9 F (36.6 C)  SpO2: 96%  Weight: 175 lb 3.2 oz (79.5 kg)  Height: 6' (1.829 m)   Body mass index is 23.76 kg/m. Physical Exam Constitutional:      General: He is not in acute distress.    Appearance: Normal appearance. He is not ill-appearing, toxic-appearing or diaphoretic.  HENT:     Head: Normocephalic and atraumatic.     Nose: Nose normal. No congestion or rhinorrhea.     Mouth/Throat:     Mouth: Mucous membranes are moist.     Pharynx: No oropharyngeal exudate or posterior oropharyngeal erythema.  Eyes:     Extraocular  Movements: Extraocular movements intact.     Pupils: Pupils are equal, round, and reactive to light.  Neck:     Musculoskeletal: Normal range of motion and neck supple.  Cardiovascular:     Rate and Rhythm: Normal rate and regular rhythm.     Heart sounds: No murmur.  Pulmonary:     Effort: Pulmonary effort is normal.     Breath sounds: No wheezing, rhonchi or rales.  Abdominal:     General: There is no distension.     Palpations: Abdomen is soft.     Tenderness: There is no abdominal tenderness. There is no guarding.  Musculoskeletal:     Right lower leg: Edema present.     Left lower leg: Edema present.     Comments: Trace edema BLE  Skin:    General: Skin is warm and dry.  Neurological:     General: No focal deficit present.     Mental Status: He is alert. Mental status is at baseline.     Cranial Nerves: No cranial nerve deficit.     Motor: No weakness.     Coordination: Coordination normal.     Gait: Gait abnormal.     Comments: Oriented to self.   Psychiatric:        Mood and Affect: Mood normal.        Behavior: Behavior normal.     Labs reviewed: Recent Labs    10/21/17 11/14/17 04/10/18  NA 139  139 140  140 144  K 3.9  3.9 4.0  4.0 4.3  CL  --   --  110  CO2  --   --  22  BUN 21 20  20  24*  CREATININE 1.2  1.21 0.93  0.93 1.3  CALCIUM 9.4 8.5  8.5 8.9   Recent Labs    08/08/17 04/10/18  AST 17 14  ALT 22 13  ALKPHOS 109 93  BILITOT 0.5  --   PROT  --  6.4  ALBUMIN 4.1 3.7   Recent Labs    08/08/17 10/21/17 04/10/18  WBC 8.6 10.2  10.2 10.2  HGB 14.3 13.2*  13.2 10.2*  HCT 40.3 39*  38.8 33*  MCV 93.1  --   --   PLT  --  180 210   Lab Results  Component Value Date   TSH 2.61 08/08/2017   Lab Results  Component Value Date   HGBA1C 7.7  12/03/2017   Lab Results  Component Value Date   CHOL 150 09/05/2017   HDL 49 01/31/2017   LDLCALC 89 09/05/2017   TRIG 113 09/05/2017    Significant Diagnostic Results in last 30 days:    No results found.  Assessment/Plan Cough URI vs side effect of Lisinopril vs possible GERD. Will start Claritin 10mg  qd, Robitussin 41ml bid x 7 days, then prn. Observe for respiratory and GI symptoms. May consider CXR if no better, may consider adding Omeprazole 20mg  qd or dc Lisinopril if CXR is unremarkable.   HTN (hypertension) No well controlled, continue Lisinopril, Furosemide, Coreg. VS q shift x 72 hours.   Oropharyngeal dysphagia Follow speech therapy recommendation, risk for aspiration.   Alzheimer disease Continue SNF [facility] for safety and care assistance, continue Memantine and Donepezil for memory.   Chronic combined systolic and diastolic congestive heart failure ([place]) Compensated clinically, continue Furosemide 20mg  qd.      Family/ staff Communication: plan of care reviewed with the patient and charge nurse.   Labs/tests ordered: none  Time spend 25 minutes.

## 2018-04-22 NOTE — Assessment & Plan Note (Signed)
URI vs side effect of Lisinopril vs possible GERD. Will start Claritin 10mg  qd, Robitussin 30ml bid x 7 days, then prn. Observe for respiratory and GI symptoms. May consider CXR if no better, may consider adding Omeprazole 20mg  qd or dc Lisinopril if CXR is unremarkable.

## 2018-04-22 NOTE — Assessment & Plan Note (Addendum)
No well controlled, continue Lisinopril, Furosemide, Coreg. VS q shift x 72 hours.

## 2018-04-22 NOTE — Assessment & Plan Note (Signed)
Follow speech therapy recommendation, risk for aspiration.

## 2018-04-22 NOTE — Assessment & Plan Note (Signed)
Continue SNF [facility] for safety and care assistance, continue Memantine and Donepezil for memory.

## 2018-04-22 NOTE — Assessment & Plan Note (Signed)
Compensated clinically, continue Furosemide 20mg qd.  

## 2018-04-25 ENCOUNTER — Encounter: Payer: Self-pay | Admitting: Nurse Practitioner

## 2018-04-25 ENCOUNTER — Other Ambulatory Visit: Payer: Self-pay

## 2018-04-25 ENCOUNTER — Emergency Department (HOSPITAL_COMMUNITY): Payer: Medicare Other

## 2018-04-25 ENCOUNTER — Inpatient Hospital Stay (HOSPITAL_COMMUNITY)
Admission: EM | Admit: 2018-04-25 | Discharge: 2018-04-30 | DRG: 193 | Disposition: A | Discharge status: Skilled Nursing Facility | Payer: Medicare Other | Attending: Nephrology | Admitting: Nephrology

## 2018-04-25 DIAGNOSIS — N179 Acute kidney failure, unspecified: Secondary | ICD-10-CM | POA: Diagnosis not present

## 2018-04-25 DIAGNOSIS — R0689 Other abnormalities of breathing: Secondary | ICD-10-CM | POA: Diagnosis not present

## 2018-04-25 DIAGNOSIS — Z96643 Presence of artificial hip joint, bilateral: Secondary | ICD-10-CM | POA: Diagnosis not present

## 2018-04-25 DIAGNOSIS — I370 Nonrheumatic pulmonary valve stenosis: Secondary | ICD-10-CM | POA: Diagnosis not present

## 2018-04-25 DIAGNOSIS — R4182 Altered mental status, unspecified: Secondary | ICD-10-CM | POA: Diagnosis not present

## 2018-04-25 DIAGNOSIS — E871 Hypo-osmolality and hyponatremia: Secondary | ICD-10-CM | POA: Diagnosis not present

## 2018-04-25 DIAGNOSIS — Z7401 Bed confinement status: Secondary | ICD-10-CM | POA: Diagnosis not present

## 2018-04-25 DIAGNOSIS — E870 Hyperosmolality and hypernatremia: Secondary | ICD-10-CM | POA: Diagnosis not present

## 2018-04-25 DIAGNOSIS — R05 Cough: Secondary | ICD-10-CM | POA: Diagnosis not present

## 2018-04-25 DIAGNOSIS — R6521 Severe sepsis with septic shock: Secondary | ICD-10-CM | POA: Diagnosis not present

## 2018-04-25 DIAGNOSIS — J9611 Chronic respiratory failure with hypoxia: Secondary | ICD-10-CM | POA: Diagnosis not present

## 2018-04-25 DIAGNOSIS — A419 Sepsis, unspecified organism: Secondary | ICD-10-CM | POA: Diagnosis not present

## 2018-04-25 DIAGNOSIS — Z79899 Other long term (current) drug therapy: Secondary | ICD-10-CM

## 2018-04-25 DIAGNOSIS — Z7984 Long term (current) use of oral hypoglycemic drugs: Secondary | ICD-10-CM

## 2018-04-25 DIAGNOSIS — E785 Hyperlipidemia, unspecified: Secondary | ICD-10-CM | POA: Diagnosis not present

## 2018-04-25 DIAGNOSIS — Z87891 Personal history of nicotine dependence: Secondary | ICD-10-CM

## 2018-04-25 DIAGNOSIS — F329 Major depressive disorder, single episode, unspecified: Secondary | ICD-10-CM | POA: Diagnosis present

## 2018-04-25 DIAGNOSIS — J189 Pneumonia, unspecified organism: Secondary | ICD-10-CM

## 2018-04-25 DIAGNOSIS — E872 Acidosis: Secondary | ICD-10-CM | POA: Diagnosis not present

## 2018-04-25 DIAGNOSIS — N390 Urinary tract infection, site not specified: Secondary | ICD-10-CM | POA: Diagnosis not present

## 2018-04-25 DIAGNOSIS — I10 Essential (primary) hypertension: Secondary | ICD-10-CM | POA: Diagnosis not present

## 2018-04-25 DIAGNOSIS — J9690 Respiratory failure, unspecified, unspecified whether with hypoxia or hypercapnia: Secondary | ICD-10-CM

## 2018-04-25 DIAGNOSIS — J121 Respiratory syncytial virus pneumonia: Secondary | ICD-10-CM | POA: Diagnosis not present

## 2018-04-25 DIAGNOSIS — E1122 Type 2 diabetes mellitus with diabetic chronic kidney disease: Secondary | ICD-10-CM | POA: Diagnosis present

## 2018-04-25 DIAGNOSIS — Z888 Allergy status to other drugs, medicaments and biological substances status: Secondary | ICD-10-CM | POA: Diagnosis not present

## 2018-04-25 DIAGNOSIS — F0390 Unspecified dementia without behavioral disturbance: Secondary | ICD-10-CM | POA: Diagnosis not present

## 2018-04-25 DIAGNOSIS — J9601 Acute respiratory failure with hypoxia: Secondary | ICD-10-CM | POA: Diagnosis not present

## 2018-04-25 DIAGNOSIS — D509 Iron deficiency anemia, unspecified: Secondary | ICD-10-CM | POA: Diagnosis present

## 2018-04-25 DIAGNOSIS — G8929 Other chronic pain: Secondary | ICD-10-CM | POA: Diagnosis not present

## 2018-04-25 DIAGNOSIS — E1165 Type 2 diabetes mellitus with hyperglycemia: Secondary | ICD-10-CM | POA: Diagnosis present

## 2018-04-25 DIAGNOSIS — R0602 Shortness of breath: Secondary | ICD-10-CM | POA: Diagnosis not present

## 2018-04-25 DIAGNOSIS — G9341 Metabolic encephalopathy: Secondary | ICD-10-CM | POA: Diagnosis present

## 2018-04-25 DIAGNOSIS — R1312 Dysphagia, oropharyngeal phase: Secondary | ICD-10-CM | POA: Diagnosis present

## 2018-04-25 DIAGNOSIS — J181 Lobar pneumonia, unspecified organism: Secondary | ICD-10-CM | POA: Diagnosis not present

## 2018-04-25 DIAGNOSIS — B9620 Unspecified Escherichia coli [E. coli] as the cause of diseases classified elsewhere: Secondary | ICD-10-CM | POA: Diagnosis present

## 2018-04-25 DIAGNOSIS — I5042 Chronic combined systolic (congestive) and diastolic (congestive) heart failure: Secondary | ICD-10-CM | POA: Diagnosis not present

## 2018-04-25 DIAGNOSIS — J690 Pneumonitis due to inhalation of food and vomit: Secondary | ICD-10-CM | POA: Diagnosis not present

## 2018-04-25 DIAGNOSIS — I110 Hypertensive heart disease with heart failure: Secondary | ICD-10-CM | POA: Diagnosis present

## 2018-04-25 DIAGNOSIS — I351 Nonrheumatic aortic (valve) insufficiency: Secondary | ICD-10-CM | POA: Diagnosis not present

## 2018-04-25 DIAGNOSIS — I491 Atrial premature depolarization: Secondary | ICD-10-CM | POA: Diagnosis not present

## 2018-04-25 DIAGNOSIS — Z85828 Personal history of other malignant neoplasm of skin: Secondary | ICD-10-CM

## 2018-04-25 DIAGNOSIS — M2550 Pain in unspecified joint: Secondary | ICD-10-CM | POA: Diagnosis not present

## 2018-04-25 DIAGNOSIS — J9600 Acute respiratory failure, unspecified whether with hypoxia or hypercapnia: Secondary | ICD-10-CM | POA: Diagnosis not present

## 2018-04-25 DIAGNOSIS — D649 Anemia, unspecified: Secondary | ICD-10-CM | POA: Diagnosis present

## 2018-04-25 DIAGNOSIS — R404 Transient alteration of awareness: Secondary | ICD-10-CM | POA: Diagnosis not present

## 2018-04-25 DIAGNOSIS — R069 Unspecified abnormalities of breathing: Secondary | ICD-10-CM | POA: Diagnosis not present

## 2018-04-25 DIAGNOSIS — Z794 Long term (current) use of insulin: Secondary | ICD-10-CM

## 2018-04-25 DIAGNOSIS — N3000 Acute cystitis without hematuria: Secondary | ICD-10-CM | POA: Diagnosis not present

## 2018-04-25 LAB — CBC WITH DIFFERENTIAL/PLATELET
Abs Immature Granulocytes: 0.03 10*3/uL (ref 0.00–0.07)
BASOS ABS: 0 10*3/uL (ref 0.0–0.1)
Basophils Relative: 0 %
EOS ABS: 0 10*3/uL (ref 0.0–0.5)
EOS PCT: 0 %
HEMATOCRIT: 30.2 % — AB (ref 39.0–52.0)
Hemoglobin: 8.7 g/dL — ABNORMAL LOW (ref 13.0–17.0)
Immature Granulocytes: 0 %
LYMPHS ABS: 1 10*3/uL (ref 0.7–4.0)
LYMPHS PCT: 11 %
MCH: 25.9 pg — AB (ref 26.0–34.0)
MCHC: 28.8 g/dL — AB (ref 30.0–36.0)
MCV: 89.9 fL (ref 80.0–100.0)
MONO ABS: 0.6 10*3/uL (ref 0.1–1.0)
Monocytes Relative: 7 %
NRBC: 0 % (ref 0.0–0.2)
Neutro Abs: 7.6 10*3/uL (ref 1.7–7.7)
Neutrophils Relative %: 82 %
Platelets: 137 10*3/uL — ABNORMAL LOW (ref 150–400)
RBC: 3.36 MIL/uL — ABNORMAL LOW (ref 4.22–5.81)
RDW: 18.6 % — AB (ref 11.5–15.5)
WBC: 9.2 10*3/uL (ref 4.0–10.5)

## 2018-04-25 LAB — URINALYSIS, ROUTINE W REFLEX MICROSCOPIC
Bilirubin Urine: NEGATIVE
Glucose, UA: NEGATIVE mg/dL
Hgb urine dipstick: NEGATIVE
Ketones, ur: NEGATIVE mg/dL
NITRITE: POSITIVE — AB
PH: 5 (ref 5.0–8.0)
PROTEIN: NEGATIVE mg/dL
SPECIFIC GRAVITY, URINE: 1.012 (ref 1.005–1.030)

## 2018-04-25 LAB — COMPREHENSIVE METABOLIC PANEL
ALK PHOS: 75 U/L (ref 38–126)
ALT: 16 U/L (ref 0–44)
AST: 19 U/L (ref 15–41)
Albumin: 3 g/dL — ABNORMAL LOW (ref 3.5–5.0)
Anion gap: 8 (ref 5–15)
BUN: 36 mg/dL — AB (ref 8–23)
CALCIUM: 8.4 mg/dL — AB (ref 8.9–10.3)
CHLORIDE: 117 mmol/L — AB (ref 98–111)
CO2: 23 mmol/L (ref 22–32)
CREATININE: 1.69 mg/dL — AB (ref 0.61–1.24)
GFR calc non Af Amer: 36 mL/min — ABNORMAL LOW (ref >60)
GFR, EST AFRICAN AMERICAN: 42 mL/min — AB (ref >60)
Glucose, Bld: 206 mg/dL — ABNORMAL HIGH (ref 70–99)
Potassium: 3.5 mmol/L (ref 3.5–5.1)
SODIUM: 148 mmol/L — AB (ref 135–145)
Total Bilirubin: 0.4 mg/dL (ref 0.3–1.2)
Total Protein: 6.3 g/dL — ABNORMAL LOW (ref 6.5–8.1)

## 2018-04-25 LAB — BLOOD GAS, ARTERIAL
ACID-BASE DEFICIT: 0.1 mmol/L (ref 0.0–2.0)
BICARBONATE: 23.1 mmol/L (ref 20.0–28.0)
DRAWN BY: 308601
O2 CONTENT: 2 L/min
O2 Saturation: 95.4 %
PATIENT TEMPERATURE: 99.1
pCO2 arterial: 33.8 mmHg (ref 32.0–48.0)
pH, Arterial: 7.45 (ref 7.350–7.450)
pO2, Arterial: 79.2 mmHg — ABNORMAL LOW (ref 83.0–108.0)

## 2018-04-25 LAB — LACTIC ACID, PLASMA
Lactic Acid, Venous: 2.3 mmol/L (ref 0.5–1.9)
Lactic Acid, Venous: 4.6 mmol/L (ref 0.5–1.9)

## 2018-04-25 MED ORDER — LACTATED RINGERS IV BOLUS
1000.0000 mL | Freq: Once | INTRAVENOUS | Status: AC
  Administered 2018-04-25: 1000 mL via INTRAVENOUS

## 2018-04-25 MED ORDER — SODIUM CHLORIDE 0.9 % IV SOLN
2.0000 g | Freq: Once | INTRAVENOUS | Status: AC
  Administered 2018-04-25: 2 g via INTRAVENOUS
  Filled 2018-04-25: qty 2

## 2018-04-25 MED ORDER — SODIUM CHLORIDE 0.9 % IV BOLUS: 1000.0000 mL | Freq: Once | INTRAVENOUS | Status: DC

## 2018-04-25 MED ORDER — SODIUM CHLORIDE 0.9 % IV SOLN
500.0000 mg | INTRAVENOUS | Status: DC
  Administered 2018-04-25: 500 mg via INTRAVENOUS
  Filled 2018-04-25: qty 500

## 2018-04-25 MED ORDER — LACTATED RINGERS IV BOLUS: 500.0000 mL | Freq: Once | INTRAVENOUS | Status: DC

## 2018-04-25 MED ORDER — VANCOMYCIN HCL IN DEXTROSE 1-5 GM/200ML-% IV SOLN
1000.0000 mg | Freq: Once | INTRAVENOUS | Status: AC
  Administered 2018-04-25: 1000 mg via INTRAVENOUS
  Filled 2018-04-25: qty 200

## 2018-04-25 NOTE — ED Notes (Signed)
Date and time results received: 04/25/18 2016   Test: Lactic Critical Value: 2.3  Name of Provider Notified: Dr. Alvino Chapel  Orders Received? Or Actions Taken?: See orders

## 2018-04-25 NOTE — Progress Notes (Signed)
A consult was received from an ED physician for Vancomycin and Cefepime per pharmacy dosing (for an indication other than meningitis). The patient's profile has been reviewed for ht/wt/allergies/indication/available labs. A one time order has been placed for the above antibiotics.  Further antibiotics/pharmacy consults should be ordered by admitting physician if indicated.                       Reuel Boom, PharmD, BCPS [phone] 04/25/2018, 9:26 PM

## 2018-04-25 NOTE — ED Provider Notes (Signed)
[facility] DEPT Provider Note   CSN: [id number] Arrival date & time: 04/25/18  1826     History   Chief Complaint Chief Complaint  Patient presents with  . Blood Infection    HPI LUM STILLINGER is a 85 y.o. male. Level 5 caveat due to altered mental status. HPI Patient presents with mental status changes from nursing home.  Reportedly has been coughing with fevers.  Baseline reportedly is verbal and ambulatory but now is neither.  Hypoxic on room air to 86%.  Does not appear to been on antibiotics.  Patient really cannot provide any history. Past Medical History:  Diagnosis Date  . Abnormality of gait   . Backache, unspecified   . CHF (congestive heart failure) ([place])   . Colon polyps    adenomatous  . Dementia ([place]) 11/12/2012  . Depression   . Diabetes mellitus without complication ([place])   . Diverticulosis   . Essential and other specified forms of tremor   . Hemorrhoids   . History of bilateral hip replacements 11/12/2012  . Memory loss   . 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 Pain in joint, pelvic region and thigh   . Skin cancer of scalp   . Spinal stenosis, lumbar region, without neurogenic claudication   . Unspecified hereditary and idiopathic peripheral neuropathy     Patient Active Problem List   Diagnosis Date Noted  . Cough 04/22/2018  . Constipation 04/15/2018  . HTN (hypertension) 04/15/2018  . Iron deficiency anemia 04/08/2018  . Actinic keratosis 06/21/2017  . Moderate protein-calorie malnutrition ([place]) 03/06/2017  . Unsteady gait 03/06/2017  . Urinary incontinence 12/31/2016  . Oropharyngeal dysphagia 12/31/2016  . Dermatitis, seborrheic 12/14/2016  . Edema 12/14/2016  . Hyperlipidemia associated with type 2 diabetes mellitus ([place]) 12/06/2016  . Functional urinary incontinence 12/06/2016  . Chronic bilateral low back pain with bilateral sciatica 12/06/2016  . Chronic combined  systolic and diastolic congestive heart failure ([place]) 12/06/2016  . Major depression, recurrent, chronic ([place]) 11/23/2016  . OAB (overactive bladder) 11/23/2016  . Dermatitis 08/23/2016  . External hemorrhoid 07/26/2016  . Depression with anxiety 07/09/2016  . Thrombocytopenia ([place]) 06/28/2016  . Urinary frequency 06/25/2016  . Closed left hip fracture, sequela 06/22/2016  . Type 2 diabetes mellitus with diabetic chronic kidney disease ([place]) 06/22/2016  . Hyperlipidemia LDL goal <70 06/22/2016  . Alzheimer disease ([place]) 11/12/2012  . History of bilateral hip replacements 11/12/2012    Past Surgical History:  Procedure Laterality Date  . CATARACT EXTRACTION, BILATERAL  2012  . JOINT REPLACEMENT    . RETINAL LASER PROCEDURE  2012   retinal wrinkle  . SKIN CANCER EXCISION    . TOTAL HIP ARTHROPLASTY Left 2000  . TOTAL HIP ARTHROPLASTY (aka REPLACEMENT) Right 2011        Home Medications    Prior to Admission medications   Medication Sig Start Date End Date Taking? Authorizing Provider  buPROPion (WELLBUTRIN SR) 100 MG 12 hr tablet Take 100 mg by mouth daily. 11/23/16  Yes [provider]  carvedilol (COREG) 3.125 MG tablet Take 1 tablet (3.125 mg total) by mouth 2 (two) times daily with a meal. 11/30/16  Yes Theodis Blaze, MD  Cholecalciferol (VITAMIN D) 1000 UNITS capsule Take 1,000 Units by mouth daily.     Yes [provider]  docusate sodium (COLACE) 100 MG capsule Take 100 mg by mouth daily.   Yes [provider]  dorzolamide-timolol (  COSOPT) 22.3-6.8 MG/ML ophthalmic solution Place 1 drop into the left eye 2 (two) times daily.  09/20/12  Yes [provider]  ferrous sulfate 325 (65 FE) MG tablet Take 325 mg by mouth daily with breakfast.   Yes [provider]  furosemide (LASIX) 20 MG tablet Take 1 tablet (20 mg total) by mouth daily. 12/01/16  Yes Theodis Blaze, MD  guaifenesin (ROBITUSSIN) 100 MG/5ML syrup Take 200 mg by mouth  every 12 (twelve) hours.   Yes [provider]  lisinopril (PRINIVIL,ZESTRIL) 5 MG tablet Take 1 tablet (5 mg total) by mouth daily. 12/01/16  Yes Theodis Blaze, MD  memantine (NAMENDA XR) 28 MG CP24 24 hr capsule Take 1 capsule (28 mg total) by mouth daily. 10/14/17  Yes Ward Givens, NP  metFORMIN (GLUCOPHAGE) 500 MG tablet Take 500 mg by mouth 2 (two) times daily with a meal.   Yes [provider]  sitaGLIPtin (JANUVIA) 25 MG tablet Take 1 tablet (25 mg total) by mouth daily. 01/08/18  Yes [name], Dinah C, NP  donepezil (ARICEPT) 23 MG TABS tablet Take 1 tablet (23 mg total) by mouth daily. 10/14/17   Ward Givens, NP  Emollient (CERAVE) CREA Apply thin layer topically to arms, legs, and trunk once daily as needed    [provider]  Flaxseed, Linseed, (FLAXSEED OIL) 1000 MG CAPS Take 1,000 mg by mouth daily.      [provider]  folic acid (FOLVITE) 967 MCG tablet Take 800 mcg by mouth every evening.     [provider]  hydrocortisone (ANUSOL-HC) 2.5 % rectal cream Place 1 application rectally at bedtime as needed for hemorrhoids or anal itching.    [provider]  insulin glargine (LANTUS) 100 UNIT/ML injection Inject 10 Units into the skin at bedtime.     [provider]  Multiple Vitamin (MULTIVITAMIN) tablet Take 1 tablet by mouth daily.      [provider]  nystatin cream (MYCOSTATIN) Apply 1 application topically 2 (two) times daily as needed for dry skin. Apply to redness on  scrotum/sacral areas    [provider]  Probiotic Product (ALIGN) 4 MG CAPS Take 4 mg by mouth daily.    [provider]  simvastatin (ZOCOR) 5 MG tablet Take 5 mg by mouth daily.    [provider]  zinc oxide 20 % ointment Apply 1 application topically 3 (three) times daily as needed for irritation.    [provider]    Family History Family History  Problem Relation Age of Onset  . Heart failure  Mother     Social History Social History   Tobacco Use  . Smoking status: Former Smoker    Types: Cigarettes  . Smokeless tobacco: Never Used  Substance Use Topics  . Alcohol use: Yes    Alcohol/week: 14.0 standard drinks    Types: 14 Glasses of wine per week    Comment: occas, one glass of wine before dinner,sometimes 1/2 glass  . Drug use: No     Allergies   Axona [bacid] and Gabapentin   Review of Systems Review of Systems  Unable to perform ROS: Mental status change     Physical Exam Updated Vital Signs BP 125/76   Pulse (!) 101   Temp 99.1 F (37.3 C) (Rectal)   Resp (!) 31   Ht 6\' 1"  (1.854 m)   Wt 79.5 kg   SpO2 93%   BMI 23.12 kg/m   Physical  Exam HENT:     Head: Atraumatic.  Eyes:     Comments: Pupils constricted.  Cardiovascular:     Rate and Rhythm: Normal rate.  Pulmonary:     Comments: Harsh breath sounds, particularly on right. Abdominal:     Tenderness: There is no abdominal tenderness.  Musculoskeletal:     Right lower leg: Edema present.     Left lower leg: Edema present.     Comments: Mild edema bilateral lower extremities.  Neurological:     Mental Status: He is alert.     Comments: Decreased mental status.  Sitting in bed with eyes closed.  However will awake somewhat to stimulation but still nonverbal.  Will look to voice when stimulated.      ED Treatments / Results  Labs (all labs ordered are listed, but only abnormal results are displayed) Labs Reviewed  COMPREHENSIVE METABOLIC PANEL - Abnormal; Notable for the following components:      Result Value   Sodium 148 (*)    Chloride 117 (*)    Glucose, Bld 206 (*)    BUN 36 (*)    Creatinine, Ser 1.69 (*)    Calcium 8.4 (*)    Total Protein 6.3 (*)    Albumin 3.0 (*)    GFR calc non Af Amer 36 (*)    GFR calc Af Amer 42 (*)    All other components within normal limits  CBC WITH DIFFERENTIAL/PLATELET - Abnormal; Notable for the following components:   RBC 3.36 (*)      Hemoglobin 8.7 (*)    HCT 30.2 (*)    MCH 25.9 (*)    MCHC 28.8 (*)    RDW 18.6 (*)    Platelets 137 (*)    All other components within normal limits  URINALYSIS, ROUTINE W REFLEX MICROSCOPIC - Abnormal; Notable for the following components:   Nitrite POSITIVE (*)    Leukocytes, UA SMALL (*)    Bacteria, UA MANY (*)    All other components within normal limits  LACTIC ACID, PLASMA - Abnormal; Notable for the following components:   Lactic Acid, Venous 2.3 (*)    All other components within normal limits  BLOOD GAS, ARTERIAL - Abnormal; Notable for the following components:   pO2, Arterial 79.2 (*)    All other components within normal limits  LACTIC ACID, PLASMA - Abnormal; Notable for the following components:   Lactic Acid, Venous 4.6 (*)    All other components within normal limits  CULTURE, BLOOD (ROUTINE X 2)  CULTURE, BLOOD (ROUTINE X 2)  URINE CULTURE  POC OCCULT BLOOD, ED    EKG EKG Interpretation  Date/Time:  Friday April 25 2018 18:55:47 EST Ventricular Rate:  87 PR Interval:    QRS Duration: 90 QT Interval:  409 QTC Calculation: 493 R Axis:   -12 Text Interpretation:  Sinus rhythm Abnormal R-wave progression, early transition Inferior infarct, old Confirmed by Davonna Belling [phone]) on 04/25/2018 8:51:16 PM   Radiology Dg Chest Portable 1 View  Result Date: 04/25/2018 CLINICAL DATA:  Worsening productive cough EXAM: PORTABLE CHEST 1 VIEW COMPARISON:  03/30/2017 FINDINGS: Lung volumes are low. Streaky parenchymal opacities are noted in the retrocardiac portion of the chest. Findings or more likely to represent atelectasis. Pneumonia would be difficult to entirely exclude. Cardiac silhouette is stable and borderline enlarged. There is mild aortic atherosclerosis at the arch. Chronic elevation of the right hemidiaphragm with bibasilar atelectasis is noted. No overt pulmonary edema or pulmonary consolidations  are identified. Obscuration of the left lateral  costophrenic angle is nonspecific but may represent a small effusion or pleural thickening. IMPRESSION: Streaky opacities in the retrocardiac portion of the chest. Findings are more likely to represent atelectasis. Pneumonia would be difficult to entirely exclude. Slight obscuration of the left lateral costophrenic angle is noted, nonspecific but may represent the presence of a small left effusion or possibly pleural thickening. Low lung volumes without overt pulmonary edema. Electronically Signed   By: Ashley Royalty M.D.   On: 04/25/2018 19:07    Procedures Procedures (including critical care time)  Medications Ordered in ED Medications  vancomycin (VANCOCIN) IVPB 1000 mg/200 mL premix (1,000 mg Intravenous New Bag/Given 04/25/18 2254)  azithromycin (ZITHROMAX) 500 mg in sodium chloride 0.9 % 250 mL IVPB (500 mg Intravenous New Bag/Given 04/25/18 2201)  lactated ringers bolus 1,000 mL (has no administration in time range)  lactated ringers bolus 1,000 mL (0 mLs Intravenous Stopped 04/25/18 2202)  ceFEPIme (MAXIPIME) 2 g in sodium chloride 0.9 % 100 mL IVPB (2 g Intravenous New Bag/Given 04/25/18 2138)  lactated ringers bolus 1,000 mL (1,000 mLs Intravenous New Bag/Given 04/25/18 2203)     Initial Impression / Assessment and Plan / ED Course  I have reviewed the triage vital signs and the nursing notes.  Pertinent labs & imaging results that were available during my care of the patient wer  Patient presented after few days of URI type symptoms.  Has been coughing.  But today much less responsive.  Normally able to walk and talk and is appropriate.  Hypoxic upon arrival.  Requires oxygen which is new for him.  Has some worsening renal function.  Initial blood pressure not low and normal initial lactic acid.  Code sepsis.initially called.  However patient did develop an episode of hypotension.  Improved with fluid bolus but repeat lactic acid is now up to 4.6.  Code sepsis had been called antibiotic  started and given more of a fluid bolus.  Initial 30/kg not given due to CHF and not in severe sepsis.  Increased fluid bolus given after hypotension and increased lactate.  Discussed with patient's wife who is healthcare power of attorney.  They have not really discussed CODE STATUS in the past but think he would want to be a full code.  Discussed with Dr. Detterding  from ICU.  Will have rounder come and see patient.  CRITICAL CARE Performed by: Davonna Belling Total critical care time: 30 minutes Critical care time was exclusive of separately billable procedures and treating other patients. Critical care was necessary to treat or prevent imminent or life-threatening deterioration. Critical care was time spent personally by me on the following activities: development of treatment plan with patient and/or surrogate as well as nursing, discussions with consultants, evaluation of patient's response to treatment, examination of patient, obtaining history from patient or surrogate, ordering and performing treatments and interventions, ordering and review of laboratory studies, ordering and review of radiographic studies, pulse oximetry and re-evaluation of patient's condition.   Final Clinical Impressions(s) / ED Diagnoses   Final diagnoses:  Sepsis with acute hypoxic respiratory failure and septic shock, due to unspecified organism [facility])  Urinary tract infection without hematuria, site unspecified  Pneumonia due to infectious organism, unspecified laterality, unspecified part of lung    ED Discharge Orders    None       Davonna Belling, MD 04/25/18 2307

## 2018-04-25 NOTE — ED Triage Notes (Addendum)
Pt to ED by [facility] from [facility] for worsening productive cough with brown sputum, worsening fever, and general decline in mental status. Pt has hx of sepsis and Rhonchi bilateral. PT is Diabetic and has hx of HTN. Pt was given 100 0mg  tylenol. Pt was 86% on RA switched to 2L [place] and spo2 went to 95% CO2 26 CBG 201 Hr 90 rr 36 labored 89% 96% on 2l  146/70 BP

## 2018-04-25 NOTE — ED Notes (Signed)
Bed: WA07 Expected date:  Expected time:  Means of arrival:  Comments: EMS 85yo sepsis

## 2018-04-25 NOTE — ED Notes (Signed)
Date and time results received: 04/25/18 10:43 PM  (use smartphrase ".now" to insert current time)  Test: Lactic Critical Value: 4.6  Name of Provider Notified: Alvino Chapel, EDP  Orders Received? Or Actions Taken?: Orders Received - See Orders for details

## 2018-04-26 ENCOUNTER — Inpatient Hospital Stay (HOSPITAL_COMMUNITY): Payer: Medicare Other

## 2018-04-26 DIAGNOSIS — Z888 Allergy status to other drugs, medicaments and biological substances status: Secondary | ICD-10-CM | POA: Diagnosis not present

## 2018-04-26 DIAGNOSIS — R1312 Dysphagia, oropharyngeal phase: Secondary | ICD-10-CM | POA: Diagnosis not present

## 2018-04-26 DIAGNOSIS — E871 Hypo-osmolality and hyponatremia: Secondary | ICD-10-CM | POA: Diagnosis present

## 2018-04-26 DIAGNOSIS — J181 Lobar pneumonia, unspecified organism: Secondary | ICD-10-CM | POA: Diagnosis not present

## 2018-04-26 DIAGNOSIS — E1165 Type 2 diabetes mellitus with hyperglycemia: Secondary | ICD-10-CM | POA: Diagnosis present

## 2018-04-26 DIAGNOSIS — D509 Iron deficiency anemia, unspecified: Secondary | ICD-10-CM | POA: Diagnosis present

## 2018-04-26 DIAGNOSIS — N179 Acute kidney failure, unspecified: Secondary | ICD-10-CM | POA: Diagnosis present

## 2018-04-26 DIAGNOSIS — J9600 Acute respiratory failure, unspecified whether with hypoxia or hypercapnia: Secondary | ICD-10-CM | POA: Diagnosis present

## 2018-04-26 DIAGNOSIS — Z96643 Presence of artificial hip joint, bilateral: Secondary | ICD-10-CM | POA: Diagnosis present

## 2018-04-26 DIAGNOSIS — Z79899 Other long term (current) drug therapy: Secondary | ICD-10-CM | POA: Diagnosis not present

## 2018-04-26 DIAGNOSIS — G8929 Other chronic pain: Secondary | ICD-10-CM | POA: Diagnosis present

## 2018-04-26 DIAGNOSIS — I351 Nonrheumatic aortic (valve) insufficiency: Secondary | ICD-10-CM | POA: Diagnosis not present

## 2018-04-26 DIAGNOSIS — I370 Nonrheumatic pulmonary valve stenosis: Secondary | ICD-10-CM

## 2018-04-26 DIAGNOSIS — E870 Hyperosmolality and hypernatremia: Secondary | ICD-10-CM | POA: Diagnosis present

## 2018-04-26 DIAGNOSIS — J9601 Acute respiratory failure with hypoxia: Secondary | ICD-10-CM | POA: Diagnosis present

## 2018-04-26 DIAGNOSIS — Z87891 Personal history of nicotine dependence: Secondary | ICD-10-CM | POA: Diagnosis not present

## 2018-04-26 DIAGNOSIS — G9341 Metabolic encephalopathy: Secondary | ICD-10-CM | POA: Diagnosis present

## 2018-04-26 DIAGNOSIS — I10 Essential (primary) hypertension: Secondary | ICD-10-CM | POA: Diagnosis not present

## 2018-04-26 DIAGNOSIS — I5042 Chronic combined systolic (congestive) and diastolic (congestive) heart failure: Secondary | ICD-10-CM | POA: Diagnosis present

## 2018-04-26 DIAGNOSIS — D649 Anemia, unspecified: Secondary | ICD-10-CM | POA: Diagnosis present

## 2018-04-26 DIAGNOSIS — N390 Urinary tract infection, site not specified: Secondary | ICD-10-CM | POA: Diagnosis present

## 2018-04-26 DIAGNOSIS — J9611 Chronic respiratory failure with hypoxia: Secondary | ICD-10-CM | POA: Diagnosis not present

## 2018-04-26 DIAGNOSIS — B9620 Unspecified Escherichia coli [E. coli] as the cause of diseases classified elsewhere: Secondary | ICD-10-CM | POA: Diagnosis present

## 2018-04-26 DIAGNOSIS — Z85828 Personal history of other malignant neoplasm of skin: Secondary | ICD-10-CM | POA: Diagnosis not present

## 2018-04-26 DIAGNOSIS — F0390 Unspecified dementia without behavioral disturbance: Secondary | ICD-10-CM | POA: Diagnosis present

## 2018-04-26 DIAGNOSIS — J690 Pneumonitis due to inhalation of food and vomit: Secondary | ICD-10-CM | POA: Diagnosis present

## 2018-04-26 DIAGNOSIS — J121 Respiratory syncytial virus pneumonia: Secondary | ICD-10-CM | POA: Diagnosis present

## 2018-04-26 DIAGNOSIS — E785 Hyperlipidemia, unspecified: Secondary | ICD-10-CM | POA: Diagnosis present

## 2018-04-26 DIAGNOSIS — I110 Hypertensive heart disease with heart failure: Secondary | ICD-10-CM | POA: Diagnosis present

## 2018-04-26 DIAGNOSIS — F329 Major depressive disorder, single episode, unspecified: Secondary | ICD-10-CM | POA: Diagnosis present

## 2018-04-26 DIAGNOSIS — E872 Acidosis: Secondary | ICD-10-CM | POA: Diagnosis present

## 2018-04-26 LAB — RESPIRATORY PANEL BY PCR
Adenovirus: NOT DETECTED
Bordetella pertussis: NOT DETECTED
CHLAMYDOPHILA PNEUMONIAE-RVPPCR: NOT DETECTED
Coronavirus 229E: NOT DETECTED
Coronavirus HKU1: NOT DETECTED
Coronavirus NL63: NOT DETECTED
Coronavirus OC43: NOT DETECTED
INFLUENZA B-RVPPCR: NOT DETECTED
Influenza A: NOT DETECTED
MYCOPLASMA PNEUMONIAE-RVPPCR: NOT DETECTED
Metapneumovirus: NOT DETECTED
Parainfluenza Virus 1: NOT DETECTED
Parainfluenza Virus 2: NOT DETECTED
Parainfluenza Virus 3: NOT DETECTED
Parainfluenza Virus 4: NOT DETECTED
Respiratory Syncytial Virus: DETECTED — AB
Rhinovirus / Enterovirus: NOT DETECTED

## 2018-04-26 LAB — CBC
HCT: 27 % — ABNORMAL LOW (ref 39.0–52.0)
Hemoglobin: 7.6 g/dL — ABNORMAL LOW (ref 13.0–17.0)
MCH: 26.5 pg (ref 26.0–34.0)
MCHC: 28.1 g/dL — ABNORMAL LOW (ref 30.0–36.0)
MCV: 94.1 fL (ref 80.0–100.0)
Platelets: 112 10*3/uL — ABNORMAL LOW (ref 150–400)
RBC: 2.87 MIL/uL — AB (ref 4.22–5.81)
RDW: 18.3 % — ABNORMAL HIGH (ref 11.5–15.5)
WBC: 7.5 10*3/uL (ref 4.0–10.5)
nRBC: 0 % (ref 0.0–0.2)

## 2018-04-26 LAB — MRSA PCR SCREENING: MRSA by PCR: NEGATIVE

## 2018-04-26 LAB — BLOOD GAS, VENOUS
Acid-base deficit: 1.5 mmol/L (ref 0.0–2.0)
Bicarbonate: 24.1 mmol/L (ref 20.0–28.0)
Delivery systems: POSITIVE
FIO2: 60
Mode: POSITIVE
O2 Saturation: 83.4 %
PCO2 VEN: 47.7 mmHg (ref 44.0–60.0)
Patient temperature: 98.6
pH, Ven: 7.323 (ref 7.250–7.430)
pO2, Ven: 56.6 mmHg — ABNORMAL HIGH (ref 32.0–45.0)

## 2018-04-26 LAB — GLUCOSE, CAPILLARY
Glucose-Capillary: 139 mg/dL — ABNORMAL HIGH (ref 70–99)
Glucose-Capillary: 121 mg/dL — ABNORMAL HIGH (ref 70–99)
Glucose-Capillary: 127 mg/dL — ABNORMAL HIGH (ref 70–99)
Glucose-Capillary: 110 mg/dL — ABNORMAL HIGH (ref 70–99)
Glucose-Capillary: 144 mg/dL — ABNORMAL HIGH (ref 70–99)
Glucose-Capillary: 111 mg/dL — ABNORMAL HIGH (ref 70–99)

## 2018-04-26 LAB — TROPONIN I
TROPONIN I: 0.04 ng/mL — AB (ref <0.03)
Troponin I: 0.07 ng/mL (ref <0.03)
Troponin I: 0.06 ng/mL (ref <0.03)

## 2018-04-26 LAB — ECHOCARDIOGRAM COMPLETE
Height: 73 in
Weight: 2751.34 oz

## 2018-04-26 LAB — BASIC METABOLIC PANEL
Anion gap: 7 (ref 5–15)
BUN: 34 mg/dL — ABNORMAL HIGH (ref 8–23)
CO2: 26 mmol/L (ref 22–32)
Calcium: 8.2 mg/dL — ABNORMAL LOW (ref 8.9–10.3)
Chloride: 115 mmol/L — ABNORMAL HIGH (ref 98–111)
Creatinine, Ser: 1.32 mg/dL — ABNORMAL HIGH (ref 0.61–1.24)
GFR calc Af Amer: 57 mL/min — ABNORMAL LOW (ref >60)
GFR, EST NON AFRICAN AMERICAN: 49 mL/min — AB (ref >60)
Glucose, Bld: 168 mg/dL — ABNORMAL HIGH (ref 70–99)
Potassium: 3.7 mmol/L (ref 3.5–5.1)
Sodium: 148 mmol/L — ABNORMAL HIGH (ref 135–145)

## 2018-04-26 LAB — STREP PNEUMONIAE URINARY ANTIGEN: Strep Pneumo Urinary Antigen: NEGATIVE

## 2018-04-26 LAB — MAGNESIUM: Magnesium: 2.2 mg/dL (ref 1.7–2.4)

## 2018-04-26 LAB — PROCALCITONIN: Procalcitonin: 0.51 ng/mL

## 2018-04-26 LAB — PHOSPHORUS: Phosphorus: 3.3 mg/dL (ref 2.5–4.6)

## 2018-04-26 LAB — BRAIN NATRIURETIC PEPTIDE: B Natriuretic Peptide: 293.7 pg/mL — ABNORMAL HIGH (ref 0.0–100.0)

## 2018-04-26 MED ORDER — FOLIC ACID 1 MG PO TABS
500.0000 ug | ORAL_TABLET | Freq: Every evening | ORAL | Status: DC
  Administered 2018-04-26 – 2018-04-30 (×4): 0.5 mg via ORAL
  Filled 2018-04-26 (×4): qty 1

## 2018-04-26 MED ORDER — ENOXAPARIN SODIUM 40 MG/0.4ML ~~LOC~~ SOLN
40.0000 mg | Freq: Every day | SUBCUTANEOUS | Status: DC
  Administered 2018-04-26 – 2018-04-30 (×5): 40 mg via SUBCUTANEOUS
  Filled 2018-04-26 (×5): qty 0.4

## 2018-04-26 MED ORDER — SODIUM CHLORIDE 0.9 % IV SOLN
1.0000 g | Freq: Two times a day (BID) | INTRAVENOUS | Status: DC
  Administered 2018-04-26 – 2018-04-28 (×6): 1 g via INTRAVENOUS
  Filled 2018-04-26 (×9): qty 1

## 2018-04-26 MED ORDER — IPRATROPIUM-ALBUTEROL 0.5-2.5 (3) MG/3ML IN SOLN
3.0000 mL | Freq: Four times a day (QID) | RESPIRATORY_TRACT | Status: DC
  Administered 2018-04-26 – 2018-04-28 (×12): 3 mL via RESPIRATORY_TRACT
  Filled 2018-04-26 (×11): qty 3

## 2018-04-26 MED ORDER — ORAL CARE MOUTH RINSE
15.0000 mL | Freq: Two times a day (BID) | OROMUCOSAL | Status: DC
  Administered 2018-04-26 – 2018-04-30 (×8): 15 mL via OROMUCOSAL

## 2018-04-26 MED ORDER — ACETAMINOPHEN 325 MG PO TABS: 650.0000 mg | ORAL_TABLET | ORAL | Status: DC | PRN

## 2018-04-26 MED ORDER — DEXTROSE 5 % IV BOLUS
250.0000 mL | Freq: Once | INTRAVENOUS | Status: AC
  Administered 2018-04-26: 250 mL via INTRAVENOUS

## 2018-04-26 MED ORDER — SIMVASTATIN 10 MG PO TABS
5.0000 mg | ORAL_TABLET | Freq: Every day | ORAL | Status: DC
  Administered 2018-04-26 – 2018-04-30 (×4): 5 mg via ORAL
  Filled 2018-04-26 (×4): qty 1

## 2018-04-26 MED ORDER — DONEPEZIL HCL 23 MG PO TABS
23.0000 mg | ORAL_TABLET | Freq: Every day | ORAL | Status: DC
  Administered 2018-04-26 – 2018-04-30 (×4): 23 mg via ORAL
  Filled 2018-04-26 (×5): qty 1

## 2018-04-26 MED ORDER — BUPROPION HCL ER (SR) 100 MG PO TB12
100.0000 mg | ORAL_TABLET | Freq: Every day | ORAL | Status: DC
  Administered 2018-04-26 – 2018-04-30 (×4): 100 mg via ORAL
  Filled 2018-04-26 (×5): qty 1

## 2018-04-26 MED ORDER — FERROUS SULFATE 325 (65 FE) MG PO TABS
325.0000 mg | ORAL_TABLET | Freq: Every day | ORAL | Status: DC
  Administered 2018-04-27 – 2018-04-30 (×3): 325 mg via ORAL
  Filled 2018-04-26 (×3): qty 1

## 2018-04-26 MED ORDER — VANCOMYCIN HCL IN DEXTROSE 1-5 GM/200ML-% IV SOLN
1000.0000 mg | INTRAVENOUS | Status: DC
  Filled 2018-04-26: qty 200

## 2018-04-26 MED ORDER — NOREPINEPHRINE 4 MG/250ML-% IV SOLN
0.0000 ug/min | INTRAVENOUS | Status: DC
  Filled 2018-04-26: qty 250

## 2018-04-26 MED ORDER — PERFLUTREN LIPID MICROSPHERE
4.0000 mL | INTRAVENOUS | Status: AC | PRN
  Administered 2018-04-26: 4 mL via INTRAVENOUS
  Filled 2018-04-26: qty 4

## 2018-04-26 MED ORDER — ADULT MULTIVITAMIN W/MINERALS CH
1.0000 | ORAL_TABLET | Freq: Every day | ORAL | Status: DC
  Administered 2018-04-27 – 2018-04-30 (×3): 1 via ORAL
  Filled 2018-04-26 (×3): qty 1

## 2018-04-26 MED ORDER — ASPIRIN 81 MG PO CHEW
81.0000 mg | CHEWABLE_TABLET | Freq: Every day | ORAL | Status: DC
  Administered 2018-04-26 – 2018-04-30 (×4): 81 mg via ORAL
  Filled 2018-04-26 (×4): qty 1

## 2018-04-26 MED ORDER — MEMANTINE HCL ER 28 MG PO CP24
28.0000 mg | ORAL_CAPSULE | Freq: Every day | ORAL | Status: DC
  Administered 2018-04-26 – 2018-04-30 (×4): 28 mg via ORAL
  Filled 2018-04-26 (×5): qty 1

## 2018-04-26 MED ORDER — VITAMIN D 25 MCG (1000 UNIT) PO TABS
1000.0000 [IU] | ORAL_TABLET | Freq: Every day | ORAL | Status: DC
  Administered 2018-04-27 – 2018-04-30 (×3): 1000 [IU] via ORAL
  Filled 2018-04-26 (×3): qty 1

## 2018-04-26 MED ORDER — NOREPINEPHRINE BITARTRATE 1 MG/ML IV SOLN
0.0000 ug/min | INTRAVENOUS | Status: DC
  Administered 2018-04-26: 1 ug/min via INTRAVENOUS
  Administered 2018-04-26: 2 ug/min via INTRAVENOUS
  Filled 2018-04-26: qty 4

## 2018-04-26 MED ORDER — DORZOLAMIDE HCL-TIMOLOL MAL 2-0.5 % OP SOLN
1.0000 [drp] | Freq: Two times a day (BID) | OPHTHALMIC | Status: DC
  Administered 2018-04-26 – 2018-04-30 (×8): 1 [drp] via OPHTHALMIC
  Filled 2018-04-26: qty 10

## 2018-04-26 MED ORDER — SODIUM CHLORIDE 0.9 % IV SOLN
500.0000 mg | INTRAVENOUS | Status: DC
  Administered 2018-04-26 – 2018-04-27 (×2): 500 mg via INTRAVENOUS
  Filled 2018-04-26 (×2): qty 500

## 2018-04-26 MED ORDER — INSULIN ASPART 100 UNIT/ML ~~LOC~~ SOLN
1.0000 [IU] | SUBCUTANEOUS | Status: DC
  Administered 2018-04-26 (×4): 1 [IU] via SUBCUTANEOUS
  Administered 2018-04-27: 2 [IU] via SUBCUTANEOUS
  Administered 2018-04-27: 3 [IU] via SUBCUTANEOUS

## 2018-04-26 NOTE — ED Notes (Signed)
ED TO INPATIENT HANDOFF REPORT  Name/Age/Gender Jose [name] 85 y.o. male  Code Status    Code Status Orders  (From admission, onward)         Start     Ordered   04/26/18 0002  Full code  Continuous     04/26/18 0008        Code Status History    Date Active Date Inactive Code Status Order ID Comments User Context   03/29/2017 1426 04/01/2017 1658 Full Code [id number]  Elease Hashimoto ED   11/28/2016 2051 11/30/2016 1812 Full Code [id number]  Charlynne Cousins, MD ED   06/22/2016 1523 06/23/2016 2135 Full Code [id number]  Caren Griffins, MD Inpatient      Home/SNF/Other Nursing Home  Chief Complaint Possible substance  Level of Care/Admitting Diagnosis ED Disposition    ED Disposition Condition [place]: [facility] [244010]  Level of Care: ICU [6]  Diagnosis: Pneumonia [272536]  Admitting Physician: Milagros Loll [[id number]]  Attending Physician: Milagros Loll [phone]  Estimated length of stay: 5 - 7 days  Certification:: I certify this patient will need inpatient services for at least 2 midnights  PT Class (Do Not Modify): Inpatient [101]  PT Acc Code (Do Not Modify): Private [1]       Medical History Past Medical History:  Diagnosis Date  . Abnormality of gait   . Backache, unspecified   . CHF (congestive heart failure) ([place])   . Colon polyps    adenomatous  . Dementia ([place]) 11/12/2012  . Depression   . Diabetes mellitus without complication ([place])   . Diverticulosis   . Essential and other specified forms of tremor   . Hemorrhoids   . History of bilateral hip replacements 11/12/2012  . Memory loss   . 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 Pain in joint, pelvic region and thigh   . Skin cancer of scalp   . Spinal stenosis, lumbar region, without neurogenic claudication   . Unspecified hereditary and idiopathic peripheral neuropathy      Allergies Allergies  Allergen Reactions  . Axona [Bacid]     Unknown per MAR  . Gabapentin     Hallucinations    IV Location/Drains/Wounds Patient Lines/Drains/Airways Status   Active Line/Drains/Airways    Name:   Placement date:   Placement time:   Site:   Days:   Peripheral IV 04/25/18 Left Antecubital   04/25/18    1842    Antecubital   1   Peripheral IV 04/25/18 Left Forearm   04/25/18    1842    Forearm   1   External Urinary Catheter   11/29/16    0530    -   513   External Urinary Catheter   03/31/17    1745    -   391          Labs/Imaging Results for orders placed or performed during the hospital encounter of 04/25/18 (from the past 48 hour(s))  Comprehensive metabolic panel     Status: Abnormal   Collection Time: 04/25/18  6:53 PM  Result Value Ref Range   Sodium 148 (H) 135 - 145 mmol/L   Potassium 3.5 3.5 - 5.1 mmol/L   Chloride 117 (H) 98 - 111 mmol/L   CO2 23 22 - 32 mmol/L   Glucose, Bld 206 (H) 70 - 99 mg/dL   BUN 36 (  H) 8 - 23 mg/dL   Creatinine, Ser 1.69 (H) 0.61 - 1.24 mg/dL   Calcium 8.4 (L) 8.9 - 10.3 mg/dL   Total Protein 6.3 (L) 6.5 - 8.1 g/dL   Albumin 3.0 (L) 3.5 - 5.0 g/dL   AST 19 15 - 41 U/L   ALT 16 0 - 44 U/L   Alkaline Phosphatase 75 38 - 126 U/L   Total Bilirubin 0.4 0.3 - 1.2 mg/dL   GFR calc non Af Amer 36 (L) >60 mL/min   GFR calc Af Amer 42 (L) >60 mL/min   Anion gap 8 5 - 15    Comment: Performed at [facility], [place] [address] [place], [place] [address]  CBC with Differential     Status: Abnormal   Collection Time: 04/25/18  6:53 PM  Result Value Ref Range   WBC 9.2 4.0 - 10.5 K/uL   RBC 3.36 (L) 4.22 - 5.81 MIL/uL   Hemoglobin 8.7 (L) 13.0 - 17.0 g/dL   HCT 30.2 (L) 39.0 - 52.0 %   MCV 89.9 80.0 - 100.0 fL   MCH 25.9 (L) 26.0 - 34.0 pg   MCHC 28.8 (L) 30.0 - 36.0 g/dL   RDW 18.6 (H) 11.5 - 15.5 %   Platelets 137 (L) 150 - 400 K/uL   nRBC 0.0 0.0 - 0.2 %   Neutrophils Relative % 82 %   Neutro  Abs 7.6 1.7 - 7.7 K/uL   Lymphocytes Relative 11 %   Lymphs Abs 1.0 0.7 - 4.0 K/uL   Monocytes Relative 7 %   Monocytes Absolute 0.6 0.1 - 1.0 K/uL   Eosinophils Relative 0 %   Eosinophils Absolute 0.0 0.0 - 0.5 K/uL   Basophils Relative 0 %   Basophils Absolute 0.0 0.0 - 0.1 K/uL   Immature Granulocytes 0 %   Abs Immature Granulocytes 0.03 0.00 - 0.07 K/uL    Comment: Performed at [facility], [place] [address] [place], [place] [address]  Culture, blood (routine x 2)     Status: None (Preliminary result)   Collection Time: 04/25/18  6:54 PM  Result Value Ref Range   Specimen Description      BLOOD LEFT FOREARM Performed at [facility], [place] [address], [place], [place] [address]    Special Requests      BOTTLES DRAWN AEROBIC AND ANAEROBIC Blood Culture results may not be optimal due to an excessive volume of blood received in culture bottles Performed at [place] [address] [place] [address], [place], [place] [address]    Culture PENDING    Report Status PENDING   Lactic acid, plasma     Status: Abnormal   Collection Time: 04/25/18  6:55 PM  Result Value Ref Range   Lactic Acid, Venous 2.3 (HH) 0.5 - 1.9 mmol/L    Comment: CRITICAL RESULT CALLED TO, READ BACK BY AND VERIFIED WITH: DOSTER,T RN @2015  ON 04/25/2018 JACKSON,K Performed at [facility], [place] [address] [place],  [address]   Blood gas, arterial     Status: Abnormal   Collection Time: 04/25/18  7:05 PM  Result Value Ref Range   O2 Content 2.0 L/min   Delivery systems NASAL CANNULA    pH, Arterial 7.450 7.350 - 7.450   pCO2 arterial 33.8 32.0 - 48.0 mmHg   pO2, Arterial 79.2 (L) 83.0 - 108.0 mmHg   Bicarbonate 23.1 20.0 - 28.0 mmol/L   Acid-base deficit 0.1 0.0 - 2.0  mmol/L   O2 Saturation 95.4 %   Patient temperature 99.1    Collection site LEFT BRACHIAL    Drawn by [id number]    Sample type ARTERIAL DRAW     Comment: Performed at [facility], [place] [address] [place], [place] [address]  Culture, blood (routine x 2)     Status: None (Preliminary result)   Collection Time: 04/25/18  7:18 PM  Result Value Ref Range   Specimen Description      BLOOD RIGHT HAND Performed at [facility], [place] [address], [place], [place] [address]    Special Requests      BOTTLES DRAWN AEROBIC AND ANAEROBIC Blood Culture adequate volume Performed at [place] [address] [place], [place] [address]    Culture PENDING    Report Status PENDING   Urinalysis, Routine w reflex microscopic     Status: Abnormal   Collection Time: 04/25/18  8:52 PM  Result Value Ref Range   Color, Urine YELLOW YELLOW   APPearance CLEAR CLEAR   Specific Gravity, Urine 1.012 1.005 - 1.030   pH 5.0 5.0 - 8.0   Glucose, UA NEGATIVE NEGATIVE mg/dL   Hgb urine dipstick NEGATIVE NEGATIVE   Bilirubin Urine NEGATIVE NEGATIVE   Ketones, ur NEGATIVE NEGATIVE mg/dL   Protein, ur NEGATIVE NEGATIVE mg/dL   Nitrite POSITIVE (A) NEGATIVE   Leukocytes, UA SMALL (A) NEGATIVE   RBC / HPF 0-5 0 - 5 RBC/hpf   WBC, UA 6-10 0 - 5 WBC/hpf   Bacteria, UA MANY (A) NONE SEEN   Mucus PRESENT    Hyaline Casts, UA PRESENT     Comment: Performed at [facility], [place] [address], [place], [place] [address]  Lactic acid, plasma     Status: Abnormal   Collection Time: 04/25/18  9:30 PM  Result Value Ref Range   Lactic Acid, Venous 4.6 (HH) 0.5 - 1.9 mmol/L    Comment: CRITICAL RESULT CALLED TO, READ BACK BY AND VERIFIED WITH: DOSTER,S RN @2243  ON 04/25/2018 JACKSON,K Performed at [facility], [place] [address] [place], [place] [address]   Blood gas, venous     Status: Abnormal   Collection Time: 04/26/18 12:27 AM  Result Value Ref Range   FIO2 60.00    Delivery systems BILEVEL POSITIVE AIRWAY PRESSURE    Mode BILEVEL POSITIVE AIRWAY PRESSURE    pH, Ven 7.323 7.250 - 7.430   pCO2, Ven 47.7 44.0 - 60.0 mmHg    pO2, Ven 56.6 (H) 32.0 - 45.0 mmHg   Bicarbonate 24.1 20.0 - 28.0 mmol/L   Acid-base deficit 1.5 0.0 - 2.0 mmol/L   O2 Saturation 83.4 %   Patient temperature 98.6    Collection site VEIN    Drawn by COLLECTED BY NURSE    Sample type VENOUS     Comment: Performed at [facility], [place] [address], [place], [place] [address]   Dg Chest Portable 1 View  Result Date: 04/25/2018 CLINICAL DATA:  Worsening productive cough EXAM: PORTABLE CHEST 1 VIEW COMPARISON:  03/30/2017 FINDINGS: Lung volumes are low. Streaky parenchymal opacities are noted in the retrocardiac portion of the chest. Findings or more likely to represent atelectasis. Pneumonia would be difficult to entirely exclude. Cardiac silhouette is stable and borderline enlarged. There is mild aortic atherosclerosis at the arch. Chronic elevation of the right hemidiaphragm with bibasilar atelectasis is noted. No overt pulmonary edema or pulmonary consolidations are identified. Obscuration  of the left lateral costophrenic angle is nonspecific but may represent a small effusion or pleural thickening. IMPRESSION: Streaky opacities in the retrocardiac portion of the chest. Findings are more likely to represent atelectasis. Pneumonia would be difficult to entirely exclude. Slight obscuration of the left lateral costophrenic angle is noted, nonspecific but may represent the presence of a small left effusion or possibly pleural thickening. Low lung volumes without overt pulmonary edema. Electronically Signed   By: Ashley Royalty M.D.   On: 04/25/2018 19:07    Pending Labs Unresulted Labs (From admission, onward)    Start     Ordered   05/03/18 0500  Creatinine, serum  (enoxaparin (LOVENOX)    CrCl >/= 30 ml/min)  Weekly,   R    Comments:  while on enoxaparin therapy    04/26/18 0008   04/27/18 0030  Lactic acid, plasma  STAT Now then every 3 hours,   R     04/26/18 0008   04/26/18 0500  CBC  Tomorrow morning,   R     04/26/18 0008    04/26/18 3976  Basic metabolic panel  Tomorrow morning,   R     04/26/18 0008   04/26/18 0500  Magnesium  Tomorrow morning,   R     04/26/18 0008   04/26/18 0500  Phosphorus  Tomorrow morning,   R     04/26/18 0008   04/26/18 0047  Brain natriuretic peptide  Add-on,   R     04/26/18 0046   04/26/18 0028  Respiratory Panel by PCR  (Respiratory virus panel with precautions)  Once,   R     04/26/18 0027   04/26/18 0003  Troponin I - Now Then Q6H  Now then every 6 hours,   R     04/26/18 0008   04/25/18 1854  Urine culture  ONCE - STAT,   STAT     04/25/18 1854          Vitals/Pain Today's Vitals   04/25/18 2300 04/25/18 2330 04/25/18 2357 04/26/18 0004  BP: 133/76 (!) 176/88 (!) 152/87 (!) 152/90  Pulse: 91 91 95 98  Resp: 19 (!) 34 (!) 30 (!) 30  Temp:      TempSrc:      SpO2: 93% (!) 88% 97% 99%  Weight:      Height:        Isolation Precautions Droplet precaution  Medications Medications  azithromycin (ZITHROMAX) 500 mg in sodium chloride 0.9 % 250 mL IVPB (has no administration in time range)  simvastatin (ZOCOR) tablet 5 mg (has no administration in time range)  buPROPion (WELLBUTRIN SR) 12 hr tablet 100 mg (has no administration in time range)  donepezil (ARICEPT) tablet 23 mg (has no administration in time range)  memantine (NAMENDA XR) 24 hr capsule 28 mg (has no administration in time range)  ferrous sulfate tablet 325 mg (has no administration in time range)  folic acid (FOLVITE) tablet 800 mcg (has no administration in time range)  Vitamin D 1,000 Units (has no administration in time range)  multivitamin tablet 1 tablet (has no administration in time range)  dorzolamide-timolol (COSOPT) 22.3-6.8 MG/ML ophthalmic solution 1 drop (has no administration in time range)  enoxaparin (LOVENOX) injection 40 mg (has no administration in time range)  acetaminophen (TYLENOL) tablet 650 mg (has no administration in time range)  ipratropium-albuterol (DUONEB) 0.5-2.5 (3)  MG/3ML nebulizer solution 3 mL (has no administration in time range)  vancomycin (VANCOCIN) IVPB 1000  mg/200 mL premix (has no administration in time range)  ceFEPIme (MAXIPIME) 1 g in sodium chloride 0.9 % 100 mL IVPB (has no administration in time range)  insulin aspart (novoLOG) injection 1-3 Units (has no administration in time range)  lactated ringers bolus 1,000 mL (0 mLs Intravenous Stopped 04/25/18 2202)  vancomycin (VANCOCIN) IVPB 1000 mg/200 mL premix (0 mg Intravenous Stopped 04/25/18 2355)  ceFEPIme (MAXIPIME) 2 g in sodium chloride 0.9 % 100 mL IVPB (0 g Intravenous Stopped 04/25/18 2309)  lactated ringers bolus 1,000 mL (0 mLs Intravenous Stopped 04/25/18 2357)  lactated ringers bolus 1,000 mL (0 mLs Intravenous Stopped 04/25/18 2358)    Mobility non-ambulatory

## 2018-04-26 NOTE — ED Notes (Signed)
Report called to Brittany, RN

## 2018-04-26 NOTE — ED Notes (Signed)
Called ICU, charge will call back for report.

## 2018-04-26 NOTE — ED Notes (Signed)
MD made aware of low BP, wants a MAP above 65, if it drops then we may start pressors.

## 2018-04-26 NOTE — Progress Notes (Signed)
Pt transported to ICU on BIPAP without incident.  RT to monitor and assess as needed.

## 2018-04-26 NOTE — Progress Notes (Signed)
NAME:  Jose [name], MRN:  [id number], DOB:  [birth year], LOS: 0 ADMISSION DATE:  04/25/2018, CONSULTATION DATE: 04/25/2018 REFERRING MD: Emergency department Dr., CHIEF COMPLAINT: Altered mental status  Brief History   85 year old man with history of CHF, dementia, DM, HTN, iron deficiency anemia presenting with altered mental status, cough, acute hypoxic respiratory failure.   History of present illness   85 year old man with history of CHF, dementia, DM, HTN, iron deficiency anemia presenting with altered mental status. Unable to obtain history from patient due to acute encephalopathy and underlying dementia. History obtained from wife, daughter and EMR. He has been having respiratory infection symptoms for the past 1.5 to 2 weeks, including cough that sounds productive. Family does not know what color the mucous has been. At baseline, he has been living at a skilled nursing facility for the past year due to a fracture and has long term memory intact but impaired short term memory. He is able to state his name and birthdate and is conversive. He is ambulatory with the help of a walker. He has been less verbal and unable to ambulate for the past week.   In the ED, he was found to be hypoxic to 86%. He was placed on [place] and then changed to BiPAP. Lactic acid increased from 2.3 to 4.6. No hypotension. He received 1g vancomycin, 2g cefepime, 500mg  azithromycin, 1.6L LR.  Currently awake, still very frail History obtained from spouse at bedside  Past Medical History   Past Medical History:  Diagnosis Date  . Abnormality of gait   . Backache, unspecified   . CHF (congestive heart failure) ([place])   . Colon polyps    adenomatous  . Dementia ([place]) 11/12/2012  . Depression   . Diabetes mellitus without complication ([place])   . Diverticulosis   . Essential and other specified forms of tremor   . Hemorrhoids   . History of bilateral hip replacements 11/12/2012  . Memory loss   . Other persistent  mental disorders due to conditions classified elsewhere   . Pain in joint, pelvic region and thigh   . Skin cancer of scalp   . Spinal stenosis, lumbar region, without neurogenic claudication   . Unspecified hereditary and idiopathic peripheral neuropathy    Significant Hospital Events   Hypoxemia for which he was subsequently placed on BiPAP Admitted 1/24 Consults:  None  Procedures:  None  Significant Diagnostic Tests:  Chest x-ray 1/24>> significant for lower lobe infiltrates, worse on the left EKG>> unremarkable for new changes  Micro Data:  Blood cultures 1/24>> Urine cultures 1/24>>  Antimicrobials:  Vanc 1/24 >> Cefepime 1/24 >>  Interim history/subjective:  Patient is nonverbal Comfortable on BiPAP Spouse at bedside  Objective   Blood pressure (!) 129/58, pulse 68, temperature (!) 97.4 F (36.3 C), temperature source Axillary, resp. rate 18, height 6\' 1"  (1.854 m), weight 78 kg, SpO2 100 %.    FiO2 (%):  [40 %-60 %] 40 %   Intake/Output Summary (Last 24 hours) at 04/26/2018 0935 Last data filed at 04/26/2018 1027 Gross per 24 hour  Intake 3191.49 ml  Output 450 ml  Net 2741.49 ml   Filed Weights   04/25/18 1850 04/26/18 0135  Weight: 79.5 kg 78 kg    Examination: General: Elderly gentleman, does not appear to be in distress, BiPAP on HENT: Neck is supple, no JVD Lungs: Decreased air entry bilaterally with very mild rales at the bases Cardiovascular: S1-S2 appreciated Abdomen: Bowel sounds appreciated, soft, nontender  Extremities: No clubbing Neuro: Awake, tries to interact GU: [facility] Problem list     Assessment & Plan:  Acute hypoxemic respiratory failure, possible pneumonia -Continue BiPAP support -We will attempt to wean as tolerated -We will transition to oxygen by nasal cannula if able to come off BiPAP -Follow radiological changes -PRN bronchodilators  Healthcare associated pneumonia -Based on recent shortness  of breath -Haziness at the bases of the lungs which may be related to atelectasis versus pneumonia -We will continue coverage for healthcare associated pneumonia -We will de-escalate once cultures are back -Check urine strep pneumo -Check procalcitonin  Possible urinary tract infection -Positive nitrite, small leukocytes, many bacteria -Currently on cefepime -Follow-up on cultures  Lactic acidosis -Cautious hydration  Hyponatremia -Likely prerenal related to decreased intake -We will fluid resuscitate -Resuscitation as to be cautious based on his history of congestive heart failure with an ejection fraction of 30%  Anemia -This is chronic -Has no evidence of bleeding -We will monitor  Diabetes -Monitor blood go glucose -SSI coverage  Dementia, depression -We will continue home medications when he can take p.o.   Best practice:  Diet: N.p.o. while on BiPAP Pain/Anxiety/Delirium protocol (if indicated): Not indicated VAP protocol (if indicated): Not indicated DVT prophylaxis: Subcu Lovenox GI prophylaxis: Not indicated Glucose control: SSI Mobility: Bedrest at present Code Status: Full code Family Communication: Discussed with spouse at bedside Disposition:   Labs   CBC: Recent Labs  Lab 04/25/18 1853 04/26/18 0316  WBC 9.2 7.5  NEUTROABS 7.6  --   HGB 8.7* 7.6*  HCT 30.2* 27.0*  MCV 89.9 94.1  PLT 137* 112*    Basic Metabolic Panel: Recent Labs  Lab 04/25/18 1853 04/26/18 0316  NA 148* 148*  K 3.5 3.7  CL 117* 115*  CO2 23 26  GLUCOSE 206* 168*  BUN 36* 34*  CREATININE 1.69* 1.32*  CALCIUM 8.4* 8.2*  MG  --  2.2  PHOS  --  3.3   GFR: Estimated Creatinine Clearance: 45.1 mL/min (A) (by C-G formula based on SCr of 1.32 mg/dL (H)). Recent Labs  Lab 04/25/18 1853 04/25/18 1855 04/25/18 2130 04/26/18 0316  WBC 9.2  --   --  7.5  LATICACIDVEN  --  2.3* 4.6*  --     Liver Function Tests: Recent Labs  Lab 04/25/18 1853  AST 19  ALT 16   ALKPHOS 75  BILITOT 0.4  PROT 6.3*  ALBUMIN 3.0*   No results for input(s): LIPASE, AMYLASE in the last 168 hours. No results for input(s): AMMONIA in the last 168 hours.  ABG    Component Value Date/Time   PHART 7.450 04/25/2018 1905   PCO2ART 33.8 04/25/2018 1905   PO2ART 79.2 (L) 04/25/2018 1905   HCO3 24.1 04/26/2018 0027   ACIDBASEDEF 1.5 04/26/2018 0027   O2SAT 83.4 04/26/2018 0027     Coagulation Profile: No results for input(s): INR, PROTIME in the last 168 hours.  Cardiac Enzymes: Recent Labs  Lab 04/26/18 0003 04/26/18 0316  TROPONINI 0.04* 0.07*    HbA1C: Hemoglobin A1C  Date/Time Value Ref Range Status  12/03/2017 7.7  Final  09/05/2017 8.6  Final    CBG: Recent Labs  Lab 04/26/18 0317 04/26/18 0752  GLUCAP 139* 121*    Review of Systems:   Review of Systems  Unable to perform ROS: Mental status change  Constitutional: Positive for malaise/fatigue.  Respiratory: Positive for cough.      Past Medical History  He,  has a past medical history of Abnormality of gait, Backache, unspecified, CHF (congestive heart failure) ([place]), Colon polyps, Dementia ([place]) (11/12/2012), Depression, Diabetes mellitus without complication ([place]), Diverticulosis, Essential and other specified forms of tremor, Hemorrhoids, History of bilateral hip replacements (11/12/2012), Memory loss, Other persistent mental disorders due to conditions classified elsewhere, Pain in joint, pelvic region and thigh, Skin cancer of scalp, Spinal stenosis, lumbar region, without neurogenic claudication, and Unspecified hereditary and idiopathic peripheral neuropathy.   Surgical History    Past Surgical History:  Procedure Laterality Date  . CATARACT EXTRACTION, BILATERAL  2012  . JOINT REPLACEMENT    . RETINAL LASER PROCEDURE  2012   retinal wrinkle  . SKIN CANCER EXCISION    . TOTAL HIP ARTHROPLASTY Left 2000  . TOTAL HIP ARTHROPLASTY (aka REPLACEMENT) Right 2011     Social History    reports that he has quit smoking. His smoking use included cigarettes. He has never used smokeless tobacco. He reports current alcohol use of about 14.0 standard drinks of alcohol per week. He reports that he does not use drugs.   Family History   His family history includes Heart failure in his mother.   Allergies Allergies  Allergen Reactions  . Axona [Bacid]     Unknown per MAR  . Gabapentin     Hallucinations     Critical care time: Acute respiratory failure in the context of possible likely associated pneumonia Requiring BiPAP at the present time Risk of decompensation is still quite significant at the present time Critical care time spent evaluating patient, reviewing records, formulating plan of care 30 minutes

## 2018-04-26 NOTE — Progress Notes (Signed)
Rt took pt off BIPAP and placed on 3 LPM [place]. No distress noted at this time.

## 2018-04-26 NOTE — Progress Notes (Signed)
  Echocardiogram 2D Echocardiogram with definity has been performed.  Darlina Sicilian M 04/26/2018, 8:17 AM

## 2018-04-26 NOTE — H&P (Addendum)
NAME:  Jose [name], MRN:  [id number], DOB:  03-26-1931, LOS: 0 ADMISSION DATE:  04/25/2018, CONSULTATION DATE:  04/25/2018 REFERRING MD:  ED, CHIEF COMPLAINT:  Altered mental status  Brief History   85 year old man with history of CHF, dementia, DM, HTN, iron deficiency anemia presenting with altered mental status, cough, acute hypoxic respiratory failure.   History of present illness   85 year old man with history of CHF, dementia, DM, HTN, iron deficiency anemia presenting with altered mental status. Unable to obtain history from patient due to acute encephalopathy and underlying dementia. History obtained from wife, daughter and EMR. He has been having respiratory infection symptoms for the past 1.5 to 2 weeks, including cough that sounds productive. Family does not know what color the mucous has been. At baseline, he has been living at a skilled nursing facility for the past year due to a fracture and has long term memory intact but impaired short term memory. He is able to state his name and birthdate and is conversive. He is ambulatory with the help of a walker. He has been less verbal and unable to ambulate for the past week.   In the ED, he was found to be hypoxic to 86%. He was placed on [place] and then changed to BiPAP. Lactic acid increased from 2.3 to 4.6. No hypotension. He received 1g vancomycin, 2g cefepime, 500mg  azithromycin, 1.6L LR.   Past Medical History  CHF, dementia, DM, HTN, iron deficiency anemia  Significant Hospital Events   1/24 >> Admit  Consults:  None  Procedures:  None  Significant Diagnostic Tests:  CXR 1/24 >> Streaky opacities in the retrocardiac portion of the chest. Slight obscuration of the left lateral costophrenic angle.   EKG 12/24 >> NSR, unchanged from previous on 03/29/2017  Micro Data:  BCx 1/24 >> UCx 1/24 >>   Antimicrobials:  Vanc 1/24 >> Cefepime 1/24 >>  Interim history/subjective:  Nonverbal but able to nod to some  questions  Objective   Blood pressure (!) 152/90, pulse 98, temperature 99.1 F (37.3 C), temperature source Rectal, resp. rate (!) 30, height 6\' 1"  (1.854 m), weight 79.5 kg, SpO2 99 %.        Intake/Output Summary (Last 24 hours) at 04/26/2018 0017 Last data filed at 04/25/2018 2358 Gross per 24 hour  Intake 3157.61 ml  Output 200 ml  Net 2957.61 ml   Filed Weights   04/25/18 1850  Weight: 79.5 kg    Examination: General: Mild respiratory distress HENT: Normocephalic, anicteric sclera Lungs: Diffuse ronchi Cardiovascular: Regular rate and rhythm Abdomen: Soft, nontender, nondistended Extremities: No LE edema Neuro: Able to move all extremities spontaneously, nonverbal, able to follow commands and nod to questions Skin: no rashes  Assessment & Plan:  85 year old man with history of CHF, dementia, DM, HTN, iron deficiency anemia presenting with altered mental status, cough, acute hypoxic respiratory failure.   Acute hypoxic respiratory failure, possible pneumonia (bacterial versus viral respiratory infection), UTI, Acute on chronic HFrEF: Afebrile but was tachycardic to 118 in the ED. Tachypnic to 4. Transient mild hypotension to 90/55 but is now hypertensive to 152/90. O2 sat in the 80s on up to 5L so he was changed to BiPAP and now O2 sat in the high 90s on 60% FiO2. No leukocytosis. EKG with no acute ischemic changes. ABG 7.45/33.8/79.2. UA with positive nitrite, small leukocytes and many bacteria. --Concern for exacerbation of his underlying heart failure (EF 30%-35% in 2018) with the fluid boluses. Will check BNP and  continue BiPAP for now. Consider Lasix in AM if BP allows --Check trops, recheck TTE --Continue vanc/cefepime for now. --Follow up blood cultures and urine culture. Check RVP. --Recheck blood gas later --Duonebs scheduled --Hold carvedilol, lisinopril --Continue home statin. Start ASA 81mg  daily --Consider Levo gtt if MAP goes below 65.  Mild  hypernatremia, AKI, lactic acidosis: Baseline cr 1.3. Na up to 148 in ED. Likely prerenal with poor PO intake. LA increased from 2.3 to 4.6. Received IV fluids. --Trend LA --Will need to be careful with fluid boluses given his CHF  Acute on Chronic Anemia: Hgb 8.7 and was previously 10.2 earlier this month. No obvious signs of bleeding. Continue to monitor. Continue home iron  DM2: SSI ordered, monitor CBG. Will consider restarting his Lantus depending on his insulin needs. Hold metformin and Januvia.  Dementia, Depression: Continue home bupropion, donepezil, Namenda  Continue home vitamin D, Cosopt eye drops, folic acid, MVI  Best practice:  Diet: NPO while on BiPAP Pain/Anxiety/Delirium protocol (if indicated): Not indicated VAP protocol (if indicated): Not indicated DVT prophylaxis: SubQ Lovenox GI prophylaxis: Not indicated Glucose control: SSI Mobility: OOB Code Status: FULL Family Communication: Discussed with wife and daughter at bedside 1/24 Disposition: Admit to ICU  Labs   CBC: Recent Labs  Lab 04/25/18 1853  WBC 9.2  NEUTROABS 7.6  HGB 8.7*  HCT 30.2*  MCV 89.9  PLT 137*    Basic Metabolic Panel: Recent Labs  Lab 04/25/18 1853  NA 148*  K 3.5  CL 117*  CO2 23  GLUCOSE 206*  BUN 36*  CREATININE 1.69*  CALCIUM 8.4*   GFR: Estimated Creatinine Clearance: 35.9 mL/min (A) (by C-G formula based on SCr of 1.69 mg/dL (H)). Recent Labs  Lab 04/25/18 1853 04/25/18 1855 04/25/18 2130  WBC 9.2  --   --   LATICACIDVEN  --  2.3* 4.6*    Liver Function Tests: Recent Labs  Lab 04/25/18 1853  AST 19  ALT 16  ALKPHOS 75  BILITOT 0.4  PROT 6.3*  ALBUMIN 3.0*    ABG    Component Value Date/Time   PHART 7.450 04/25/2018 1905   PCO2ART 33.8 04/25/2018 1905   PO2ART 79.2 (L) 04/25/2018 1905   HCO3 23.1 04/25/2018 1905   ACIDBASEDEF 0.1 04/25/2018 1905   O2SAT 95.4 04/25/2018 1905     HbA1C: Hemoglobin A1C  Date/Time Value Ref Range Status   12/03/2017 7.7  Final  09/05/2017 8.6  Final    Review of Systems:   Unable to obtain due to mental status  Past Medical History  He,  has a past medical history of Abnormality of gait, Backache, unspecified, CHF (congestive heart failure) ([place]), Colon polyps, Dementia ([place]) (11/12/2012), Depression, Diabetes mellitus without complication ([place]), Diverticulosis, Essential and other specified forms of tremor, Hemorrhoids, History of bilateral hip replacements (11/12/2012), Memory loss, Other persistent mental disorders due to conditions classified elsewhere, Pain in joint, pelvic region and thigh, Skin cancer of scalp, Spinal stenosis, lumbar region, without neurogenic claudication, and Unspecified hereditary and idiopathic peripheral neuropathy.   Surgical History    Past Surgical History:  Procedure Laterality Date  . CATARACT EXTRACTION, BILATERAL  2012  . JOINT REPLACEMENT    . RETINAL LASER PROCEDURE  2012   retinal wrinkle  . SKIN CANCER EXCISION    . TOTAL HIP ARTHROPLASTY Left 2000  . TOTAL HIP ARTHROPLASTY (aka REPLACEMENT) Right 2011     Social History   reports that he has quit smoking. His smoking use  included cigarettes. He has never used smokeless tobacco. He reports current alcohol use of about 14.0 standard drinks of alcohol per week. He reports that he does not use drugs.   Family History   His family history includes Heart failure in his mother.   Allergies Allergies  Allergen Reactions  . Axona [Bacid]     Unknown per MAR  . Gabapentin     Hallucinations     Home Medications  Prior to Admission medications   Medication Sig Start Date End Date Taking? Authorizing Provider  buPROPion (WELLBUTRIN SR) 100 MG 12 hr tablet Take 100 mg by mouth daily. 11/23/16  Yes [provider]  carvedilol (COREG) 3.125 MG tablet Take 1 tablet (3.125 mg total) by mouth 2 (two) times daily with a meal. 11/30/16  Yes Theodis Blaze, MD  Cholecalciferol (VITAMIN D) 1000  UNITS capsule Take 1,000 Units by mouth daily.     Yes [provider]  docusate sodium (COLACE) 100 MG capsule Take 100 mg by mouth daily.   Yes [provider]  dorzolamide-timolol (COSOPT) 22.3-6.8 MG/ML ophthalmic solution Place 1 drop into the left eye 2 (two) times daily.  09/20/12  Yes [provider]  ferrous sulfate 325 (65 FE) MG tablet Take 325 mg by mouth daily with breakfast.   Yes [provider]  furosemide (LASIX) 20 MG tablet Take 1 tablet (20 mg total) by mouth daily. 12/01/16  Yes Theodis Blaze, MD  guaifenesin (ROBITUSSIN) 100 MG/5ML syrup Take 200 mg by mouth every 12 (twelve) hours.   Yes [provider]  lisinopril (PRINIVIL,ZESTRIL) 5 MG tablet Take 1 tablet (5 mg total) by mouth daily. 12/01/16  Yes Theodis Blaze, MD  memantine (NAMENDA XR) 28 MG CP24 24 hr capsule Take 1 capsule (28 mg total) by mouth daily. 10/14/17  Yes Ward Givens, NP  metFORMIN (GLUCOPHAGE) 500 MG tablet Take 500 mg by mouth 2 (two) times daily with a meal.   Yes [provider]  sitaGLIPtin (JANUVIA) 25 MG tablet Take 1 tablet (25 mg total) by mouth daily. 01/08/18  Yes [name], Dinah C, NP  donepezil (ARICEPT) 23 MG TABS tablet Take 1 tablet (23 mg total) by mouth daily. 10/14/17   Ward Givens, NP  Emollient (CERAVE) CREA Apply thin layer topically to arms, legs, and trunk once daily as needed    [provider]  Flaxseed, Linseed, (FLAXSEED OIL) 1000 MG CAPS Take 1,000 mg by mouth daily.      [provider]  folic acid (FOLVITE) 297 MCG tablet Take 800 mcg by mouth every evening.     [provider]  hydrocortisone (ANUSOL-HC) 2.5 % rectal cream Place 1 application rectally at bedtime as needed for hemorrhoids or anal itching.    [provider]  insulin glargine (LANTUS) 100 UNIT/ML injection Inject 10 Units into the skin at bedtime.     [provider]  Multiple Vitamin (MULTIVITAMIN) tablet Take 1  tablet by mouth daily.      [provider]  nystatin cream (MYCOSTATIN) Apply 1 application topically 2 (two) times daily as needed for dry skin. Apply to redness on  scrotum/sacral areas    [provider]  Probiotic Product (ALIGN) 4 MG CAPS Take 4 mg by mouth daily.    [provider]  simvastatin (ZOCOR) 5 MG tablet Take 5 mg by mouth daily.    [provider]  zinc oxide 20 % ointment Apply 1 application topically 3 (  three) times daily as needed for irritation.    [provider]     Critical care time: The patient is critically ill with multiple organ systems failure and requires high complexity decision making for assessment and support, frequent evaluation and titration of therapies, application of advanced monitoring technologies and extensive interpretation of multiple databases.   Critical Care Time devoted to patient care services described in this note is  67 Minutes. This time reflects time of care of this signee. This critical care time does not reflect procedure time, or teaching time or supervisory time of PA/NP/Med student/Med Resident etc but could involve care discussion time.  Jacques Earthly, M.D. [facility] Pulmonary/Critical Care Medicine After hours pager: [phone].

## 2018-04-26 NOTE — Progress Notes (Signed)
Pharmacy Antibiotic Note  Jose [name] is a 85 y.o. male admitted on 04/25/2018 with sepsis.  Pharmacy has been consulted for cefepime and vancomycin dosing.  Plan: Cefepime 2 gm x1 then 1 Gm IV q12h Vancomycin 1 gm IV q24h for est AUC = 508 Goal AUC =400-550 F/u scr/cultures/levels  Height: 6\' 1"  (185.4 cm) Weight: 175 lb 4.3 oz (79.5 kg) IBW/kg (Calculated) : 79.9  Temp (24hrs), Avg:98.5 F (36.9 C), Min:97.9 F (36.6 C), Max:99.1 F (37.3 C)  Recent Labs  Lab 04/25/18 1853 04/25/18 1855 04/25/18 2130  WBC 9.2  --   --   CREATININE 1.69*  --   --   LATICACIDVEN  --  2.3* 4.6*    Estimated Creatinine Clearance: 35.9 mL/min (A) (by C-G formula based on SCr of 1.69 mg/dL (H)).    Allergies  Allergen Reactions  . Axona [Bacid]     Unknown per MAR  . Gabapentin     Hallucinations    Antimicrobials this admission: 1/24 cefepime >>  1/24 vancomycin >>   Dose adjustments this admission:   Microbiology results:  BCx:   UCx:    Sputum:    MRSA PCR:   Thank you for allowing pharmacy to be a part of this patient's care.  Dorrene German 04/26/2018 12:13 AM

## 2018-04-27 DIAGNOSIS — J181 Lobar pneumonia, unspecified organism: Secondary | ICD-10-CM

## 2018-04-27 DIAGNOSIS — J9611 Chronic respiratory failure with hypoxia: Secondary | ICD-10-CM

## 2018-04-27 LAB — BLOOD CULTURE ID PANEL (REFLEXED)
Acinetobacter baumannii: NOT DETECTED
Candida albicans: NOT DETECTED
Candida glabrata: NOT DETECTED
Candida krusei: NOT DETECTED
Candida parapsilosis: NOT DETECTED
Candida tropicalis: NOT DETECTED
ENTEROBACTER CLOACAE COMPLEX: NOT DETECTED
Enterobacteriaceae species: NOT DETECTED
Enterococcus species: NOT DETECTED
Escherichia coli: NOT DETECTED
Haemophilus influenzae: NOT DETECTED
KLEBSIELLA OXYTOCA: NOT DETECTED
Klebsiella pneumoniae: NOT DETECTED
Listeria monocytogenes: NOT DETECTED
Methicillin resistance: NOT DETECTED
Neisseria meningitidis: NOT DETECTED
Proteus species: NOT DETECTED
Pseudomonas aeruginosa: NOT DETECTED
STAPHYLOCOCCUS SPECIES: DETECTED — AB
Serratia marcescens: NOT DETECTED
Staphylococcus aureus (BCID): NOT DETECTED
Streptococcus agalactiae: NOT DETECTED
Streptococcus pneumoniae: NOT DETECTED
Streptococcus pyogenes: NOT DETECTED
Streptococcus species: NOT DETECTED

## 2018-04-27 LAB — PROCALCITONIN: Procalcitonin: 0.45 ng/mL

## 2018-04-27 LAB — LACTIC ACID, PLASMA
Lactic Acid, Venous: 1 mmol/L (ref 0.5–1.9)
Lactic Acid, Venous: 1.2 mmol/L (ref 0.5–1.9)

## 2018-04-27 LAB — CBC WITH DIFFERENTIAL/PLATELET
Abs Immature Granulocytes: 0.02 10*3/uL (ref 0.00–0.07)
Abs Immature Granulocytes: 0.03 10*3/uL (ref 0.00–0.07)
Basophils Absolute: 0 10*3/uL (ref 0.0–0.1)
Basophils Absolute: 0 10*3/uL (ref 0.0–0.1)
Basophils Relative: 0 %
Basophils Relative: 0 %
EOS PCT: 1 %
Eosinophils Absolute: 0.1 10*3/uL (ref 0.0–0.5)
Eosinophils Absolute: 0.1 10*3/uL (ref 0.0–0.5)
Eosinophils Relative: 2 %
HCT: 28.5 % — ABNORMAL LOW (ref 39.0–52.0)
HCT: 29.1 % — ABNORMAL LOW (ref 39.0–52.0)
HEMOGLOBIN: 8.2 g/dL — AB (ref 13.0–17.0)
Hemoglobin: 8.1 g/dL — ABNORMAL LOW (ref 13.0–17.0)
Immature Granulocytes: 0 %
Immature Granulocytes: 1 %
LYMPHS PCT: 22 %
Lymphocytes Relative: 17 %
Lymphs Abs: 1.2 10*3/uL (ref 0.7–4.0)
Lymphs Abs: 1 10*3/uL (ref 0.7–4.0)
MCH: 26.4 pg (ref 26.0–34.0)
MCH: 26.7 pg (ref 26.0–34.0)
MCHC: 28.4 g/dL — ABNORMAL LOW (ref 30.0–36.0)
MCHC: 28.2 g/dL — AB (ref 30.0–36.0)
MCV: 92.8 fL (ref 80.0–100.0)
MCV: 94.8 fL (ref 80.0–100.0)
Monocytes Absolute: 0.4 10*3/uL (ref 0.1–1.0)
Monocytes Absolute: 0.3 10*3/uL (ref 0.1–1.0)
Monocytes Relative: 7 %
Monocytes Relative: 4 %
Neutro Abs: 3.7 10*3/uL (ref 1.7–7.7)
Neutro Abs: 4.5 10*3/uL (ref 1.7–7.7)
Neutrophils Relative %: 69 %
Neutrophils Relative %: 77 %
Platelets: 128 10*3/uL — ABNORMAL LOW (ref 150–400)
Platelets: 121 10*3/uL — ABNORMAL LOW (ref 150–400)
RBC: 3.07 MIL/uL — AB (ref 4.22–5.81)
RBC: 3.07 MIL/uL — ABNORMAL LOW (ref 4.22–5.81)
RDW: 18.2 % — ABNORMAL HIGH (ref 11.5–15.5)
RDW: 18.2 % — ABNORMAL HIGH (ref 11.5–15.5)
WBC: 5.4 10*3/uL (ref 4.0–10.5)
WBC: 5.9 10*3/uL (ref 4.0–10.5)
nRBC: 0 % (ref 0.0–0.2)
nRBC: 0 % (ref 0.0–0.2)

## 2018-04-27 LAB — GLUCOSE, CAPILLARY
Glucose-Capillary: 105 mg/dL — ABNORMAL HIGH (ref 70–99)
Glucose-Capillary: 106 mg/dL — ABNORMAL HIGH (ref 70–99)
Glucose-Capillary: 229 mg/dL — ABNORMAL HIGH (ref 70–99)
Glucose-Capillary: 161 mg/dL — ABNORMAL HIGH (ref 70–99)
Glucose-Capillary: 148 mg/dL — ABNORMAL HIGH (ref 70–99)

## 2018-04-27 LAB — BASIC METABOLIC PANEL
Anion gap: 6 (ref 5–15)
Anion gap: 7 (ref 5–15)
BUN: 23 mg/dL (ref 8–23)
BUN: 23 mg/dL (ref 8–23)
CO2: 24 mmol/L (ref 22–32)
CO2: 25 mmol/L (ref 22–32)
Calcium: 8.3 mg/dL — ABNORMAL LOW (ref 8.9–10.3)
Calcium: 8.4 mg/dL — ABNORMAL LOW (ref 8.9–10.3)
Chloride: 119 mmol/L — ABNORMAL HIGH (ref 98–111)
Chloride: 116 mmol/L — ABNORMAL HIGH (ref 98–111)
Creatinine, Ser: 1.11 mg/dL (ref 0.61–1.24)
Creatinine, Ser: 1.15 mg/dL (ref 0.61–1.24)
GFR calc Af Amer: >60 mL/min (ref >60)
GFR calc non Af Amer: >60 mL/min (ref >60)
GFR calc non Af Amer: 58 mL/min — ABNORMAL LOW (ref >60)
Glucose, Bld: 103 mg/dL — ABNORMAL HIGH (ref 70–99)
Glucose, Bld: 237 mg/dL — ABNORMAL HIGH (ref 70–99)
Potassium: 3.5 mmol/L (ref 3.5–5.1)
Potassium: 3.6 mmol/L (ref 3.5–5.1)
SODIUM: 149 mmol/L — AB (ref 135–145)
Sodium: 148 mmol/L — ABNORMAL HIGH (ref 135–145)

## 2018-04-27 LAB — HEPATIC FUNCTION PANEL
ALBUMIN: 2.5 g/dL — AB (ref 3.5–5.0)
ALT: 40 U/L (ref 0–44)
AST: 44 U/L — AB (ref 15–41)
Alkaline Phosphatase: 91 U/L (ref 38–126)
Bilirubin, Direct: 0.1 mg/dL (ref 0.0–0.2)
Indirect Bilirubin: 0.4 mg/dL (ref 0.3–0.9)
Total Bilirubin: 0.5 mg/dL (ref 0.3–1.2)
Total Protein: 5.5 g/dL — ABNORMAL LOW (ref 6.5–8.1)

## 2018-04-27 MED ORDER — INSULIN ASPART 100 UNIT/ML ~~LOC~~ SOLN
0.0000 [IU] | Freq: Three times a day (TID) | SUBCUTANEOUS | Status: DC
  Administered 2018-04-28: 5 [IU] via SUBCUTANEOUS
  Administered 2018-04-28 – 2018-04-29 (×2): 1 [IU] via SUBCUTANEOUS
  Administered 2018-04-29: 3 [IU] via SUBCUTANEOUS
  Administered 2018-04-29: 2 [IU] via SUBCUTANEOUS
  Administered 2018-04-30: 1 [IU] via SUBCUTANEOUS
  Administered 2018-04-30 (×2): 2 [IU] via SUBCUTANEOUS

## 2018-04-27 MED ORDER — ALBUTEROL SULFATE (2.5 MG/3ML) 0.083% IN NEBU
2.5000 mg | INHALATION_SOLUTION | RESPIRATORY_TRACT | Status: DC | PRN
  Administered 2018-04-27: 2.5 mg via RESPIRATORY_TRACT
  Filled 2018-04-27: qty 3

## 2018-04-27 MED ORDER — INSULIN ASPART 100 UNIT/ML ~~LOC~~ SOLN
0.0000 [IU] | Freq: Every day | SUBCUTANEOUS | Status: DC
  Administered 2018-04-29: 2 [IU] via SUBCUTANEOUS

## 2018-04-27 NOTE — Progress Notes (Signed)
Pt's family has hired Engineer, manufacturing to have a Actuary with pt 24 hours a day, bed alarm is on but have not put floor mats down due to sitter in the room

## 2018-04-27 NOTE — Progress Notes (Signed)
PHARMACY - PHYSICIAN COMMUNICATION CRITICAL VALUE ALERT - BLOOD CULTURE IDENTIFICATION (BCID)  Jose [name] is an 85 y.o. male who presented to [facility] on 04/25/2018 with a chief complaint of SOB.  Assessment:  PNA and urine (include suspected source if known) 1 of 4 bottles staph species- likely contaminant Name of physician (or Provider) Contacted: Dr Deterding (elink)  Current antibiotics: Cefepime 1 IV q12h  Changes to prescribed antibiotics recommended:  Patient is on recommended antibiotics - No changes needed  Results for orders placed or performed during the hospital encounter of 04/25/18  Blood Culture ID Panel (Reflexed) (Collected: 04/25/2018  6:54 PM)  Result Value Ref Range   Enterococcus species NOT DETECTED NOT DETECTED   Listeria monocytogenes NOT DETECTED NOT DETECTED   Staphylococcus species DETECTED (A) NOT DETECTED   Staphylococcus aureus (BCID) NOT DETECTED NOT DETECTED   Methicillin resistance NOT DETECTED NOT DETECTED   Streptococcus species NOT DETECTED NOT DETECTED   Streptococcus agalactiae NOT DETECTED NOT DETECTED   Streptococcus pneumoniae NOT DETECTED NOT DETECTED   Streptococcus pyogenes NOT DETECTED NOT DETECTED   Acinetobacter baumannii NOT DETECTED NOT DETECTED   Enterobacteriaceae species NOT DETECTED NOT DETECTED   Enterobacter cloacae complex NOT DETECTED NOT DETECTED   Escherichia coli NOT DETECTED NOT DETECTED   Klebsiella oxytoca NOT DETECTED NOT DETECTED   Klebsiella pneumoniae NOT DETECTED NOT DETECTED   Proteus species NOT DETECTED NOT DETECTED   Serratia marcescens NOT DETECTED NOT DETECTED   Haemophilus influenzae NOT DETECTED NOT DETECTED   Neisseria meningitidis NOT DETECTED NOT DETECTED   Pseudomonas aeruginosa NOT DETECTED NOT DETECTED   Candida albicans NOT DETECTED NOT DETECTED   Candida glabrata NOT DETECTED NOT DETECTED   Candida krusei NOT DETECTED NOT DETECTED   Candida parapsilosis NOT DETECTED NOT DETECTED   Candida  tropicalis NOT DETECTED NOT DETECTED    Dorrene German 04/27/2018  12:47 AM

## 2018-04-27 NOTE — Progress Notes (Addendum)
NAME:  Jose [name], MRN:  [id number], DOB:  [birth year], LOS: 1 ADMISSION DATE:  04/25/2018, CONSULTATION DATE: 04/25/2018 REFERRING MD: Emergency department Dr., CHIEF COMPLAINT: Altered mental status  Brief History   85 year old man with history of CHF, dementia, DM, HTN, iron deficiency anemia presenting with altered mental status, cough, acute hypoxic respiratory failure.  Background history of dementia He is feeling better today  History of present illness   85 year old man with history of CHF, dementia, DM, HTN, iron deficiency anemia presenting with altered mental status. Unable to obtain history from patient due to acute encephalopathy and underlying dementia. History obtained from wife, daughter and EMR. He has been having respiratory infection symptoms for the past 1.5 to 2 weeks, including cough that sounds productive. Family does not know what color the mucous has been. At baseline, he has been living at a skilled nursing facility for the past year due to a fracture and has long term memory intact but impaired short term memory. He is able to state his name and birthdate and is conversive. He is ambulatory with the help of a walker. He has been less verbal and unable to ambulate for the past week.   In the ED, he was found to be hypoxic to 86%. He was placed on [place] and then changed to BiPAP. Lactic acid increased from 2.3 to 4.6. No hypotension. He received 1g vancomycin, 2g cefepime, 500mg  azithromycin, 1.6L LR.  Currently awake, still very frail History obtained from spouse at bedside  Past Medical History   Past Medical History:  Diagnosis Date  . Abnormality of gait   . Backache, unspecified   . CHF (congestive heart failure) ([place])   . Colon polyps    adenomatous  . Dementia ([place]) 11/12/2012  . Depression   . Diabetes mellitus without complication ([place])   . Diverticulosis   . Essential and other specified forms of tremor   . Hemorrhoids   . History of bilateral hip  replacements 11/12/2012  . Memory loss   . 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 Pain in joint, pelvic region and thigh   . Skin cancer of scalp   . Spinal stenosis, lumbar region, without neurogenic claudication   . Unspecified hereditary and idiopathic peripheral neuropathy    Significant Hospital Events   Hypoxemia for which he was subsequently placed on BiPAP-weaned off BiPAP Admitted 1/24 -Much better today, tolerating orally Consults:  None  Procedures:  None  Significant Diagnostic Tests:  Chest x-ray 1/24>> significant for lower lobe infiltrates, worse on the left EKG>> unremarkable for new changes  Micro Data:  Blood cultures 1/24>> coagulase-negative staph Urine cultures 1/24>> gram-negative rods in urine MRSA PCR negative  Antimicrobials:  Vanc 1/24 >> Cefepime 1/24 >>  Interim history/subjective:  Patient is nonverbal Comfortable on BiPAP Spouse at bedside  Objective   Blood pressure (!) 162/89, pulse 69, temperature 98.2 F (36.8 C), temperature source Axillary, resp. rate 20, height 6\' 1"  (1.854 m), weight 78.3 kg, SpO2 99 %.        Intake/Output Summary (Last 24 hours) at 04/27/2018 1030 Last data filed at 04/27/2018 1000 Gross per 24 hour  Intake 1089.42 ml  Output 600 ml  Net 489.42 ml   Filed Weights   04/25/18 1850 04/26/18 0135 04/27/18 0500  Weight: 79.5 kg 78 kg 78.3 kg    Examination: General: Elderly gentleman, appears comfortable HENT: Neck is supple, no JVD Lungs: Few rales at the bases  Cardiovascular: S1-S2 Abdomen: Bowel sounds appreciated Extremities: No clubbing Neuro: Awake, interacting appropriately GU: [facility] Problem list     Assessment & Plan:  Acute hypoxemic respiratory failure, possible pneumonia -Oxygen supplementation -Follow radiological changes -Radiological changes may be related to atelectasis -PRN bronchodilators  Healthcare associated  pneumonia -Based on recent shortness of breath -Haziness at the bases of the lungs which may be related to atelectasis versus pneumonia -We will de-escalate once cultures are back -Check procalcitonin-0.51, 0.45 -Urine strep pneumo negative  Possible urinary tract infection -Positive nitrite, small leukocytes, many bacteria -Currently on cefepime -Follow-up on cultures  Lactic acidosis -Cautious hydration  Hyponatremia -Likely prerenal related to decreased intake -We will fluid resuscitate -Resuscitation as to be cautious based on his history of congestive heart failure with an ejection fraction of 30%  Anemia -This is chronic -Has no evidence of bleeding  Diabetes -Monitor blood go glucose -SSI coverage  Dementia, depression -We will continue home medications when he can take p.o.  Hyperatremia -Encourage oral fluid intake  Off vancomycin Plan to de-escalate antibiotics once cultures are back Coagulase-negative staph in blood culture likely contaminant  Best practice:  Diet: Heart healthy Pain/Anxiety/Delirium protocol (if indicated): Not indicated VAP protocol (if indicated): Not indicated DVT prophylaxis: Subcu Lovenox GI prophylaxis: Not indicated Glucose control: SSI Mobility: Bedrest at present Code Status: Full code Family Communication: No family at bedside Disposition: Continue current lines again ICU  Labs   CBC: Recent Labs  Lab 04/25/18 1853 04/26/18 0316 04/27/18 0321  WBC 9.2 7.5 5.4  NEUTROABS 7.6  --  3.7  HGB 8.7* 7.6* 8.1*  HCT 30.2* 27.0* 28.5*  MCV 89.9 94.1 92.8  PLT 137* 112* 128*    Basic Metabolic Panel: Recent Labs  Lab 04/25/18 1853 04/26/18 0316 04/27/18 0321  NA 148* 148* 149*  K 3.5 3.7 3.5  CL 117* 115* 119*  CO2 23 26 24   GLUCOSE 206* 168* 103*  BUN 36* 34* 23  CREATININE 1.69* 1.32* 1.11  CALCIUM 8.4* 8.2* 8.3*  MG  --  2.2  --   PHOS  --  3.3  --    GFR: Estimated Creatinine Clearance: 53.9 mL/min (by  C-G formula based on SCr of 1.11 mg/dL). Recent Labs  Lab 04/25/18 1853 04/25/18 1855 04/25/18 2130 04/26/18 0316 04/26/18 1055 04/27/18 0009 04/27/18 0321  PROCALCITON  --   --   --   --  0.51  --  0.45  WBC 9.2  --   --  7.5  --   --  5.4  LATICACIDVEN  --  2.3* 4.6*  --   --  1.0 1.2    Liver Function Tests: Recent Labs  Lab 04/25/18 1853 04/27/18 0321  AST 19 44*  ALT 16 40  ALKPHOS 75 91  BILITOT 0.4 0.5  PROT 6.3* 5.5*  ALBUMIN 3.0* 2.5*   No results for input(s): LIPASE, AMYLASE in the last 168 hours. No results for input(s): AMMONIA in the last 168 hours.  ABG    Component Value Date/Time   PHART 7.450 04/25/2018 1905   PCO2ART 33.8 04/25/2018 1905   PO2ART 79.2 (L) 04/25/2018 1905   HCO3 24.1 04/26/2018 0027   ACIDBASEDEF 1.5 04/26/2018 0027   O2SAT 83.4 04/26/2018 0027     Coagulation Profile: No results for input(s): INR, PROTIME in the last 168 hours.  Cardiac Enzymes: Recent Labs  Lab 04/26/18 0003 04/26/18 0316 04/26/18 1203  TROPONINI 0.04* 0.07* 0.06*    HbA1C:  Hemoglobin A1C  Date/Time Value Ref Range Status  12/03/2017 7.7  Final  09/05/2017 8.6  Final    CBG: Recent Labs  Lab 04/26/18 1738 04/26/18 1923 04/26/18 2315 04/27/18 0312 04/27/18 0748  GLUCAP 110* 144* 111* 105* 106*    Review of Systems:   Review of Systems  Unable to perform ROS: Mental status change  Constitutional: Positive for malaise/fatigue.  Respiratory: Positive for cough.      Past Medical History  He,  has a past medical history of Abnormality of gait, Backache, unspecified, CHF (congestive heart failure) ([place]), Colon polyps, Dementia ([place]) (11/12/2012), Depression, Diabetes mellitus without complication ([place]), Diverticulosis, Essential and other specified forms of tremor, Hemorrhoids, History of bilateral hip replacements (11/12/2012), Memory loss, Other persistent mental disorders due to conditions classified elsewhere, Pain in joint, pelvic region  and thigh, Skin cancer of scalp, Spinal stenosis, lumbar region, without neurogenic claudication, and Unspecified hereditary and idiopathic peripheral neuropathy.   Surgical History    Past Surgical History:  Procedure Laterality Date  . CATARACT EXTRACTION, BILATERAL  2012  . JOINT REPLACEMENT    . RETINAL LASER PROCEDURE  2012   retinal wrinkle  . SKIN CANCER EXCISION    . TOTAL HIP ARTHROPLASTY Left 2000  . TOTAL HIP ARTHROPLASTY (aka REPLACEMENT) Right 2011     Social History   reports that he has quit smoking. His smoking use included cigarettes. He has never used smokeless tobacco. He reports current alcohol use of about 14.0 standard drinks of alcohol per week. He reports that he does not use drugs.   Family History   His family history includes Heart failure in his mother.   Allergies Allergies  Allergen Reactions  . Axona [Bacid]     Unknown per MAR  . Gabapentin     Hallucinations     Critical care time: Acute respiratory failure in the context of possible likely associated pneumonia-CCM time 30 minutes Risk of decompensation still significant, continue close clinical monitoring, correct electrolytes

## 2018-04-27 NOTE — Progress Notes (Signed)
eLink Physician-Brief Progress Note Patient Name: Jose [name] DOB: January 11, 1931 MRN: [id number]   Date of Service  04/27/2018  HPI/Events of Note  Patient is now on PO diet and is on Q 4 hour sensitive Novolog SSI. Last blood glucose = 148.   eICU Interventions  Will order: 1. D/C Q 4 hour sensitive Novolog SSI. 2. AC/HS sensitive Novolog SSI.      Intervention Category Major Interventions: Hyperglycemia - active titration of insulin therapy  Lysle Dingwall 04/27/2018, 8:59 PM

## 2018-04-28 ENCOUNTER — Inpatient Hospital Stay (HOSPITAL_COMMUNITY): Payer: Medicare Other

## 2018-04-28 ENCOUNTER — Encounter (HOSPITAL_COMMUNITY): Payer: Self-pay

## 2018-04-28 LAB — URINE CULTURE: Culture: >=100000 — AB

## 2018-04-28 LAB — CULTURE, BLOOD (ROUTINE X 2)

## 2018-04-28 LAB — PROCALCITONIN: Procalcitonin: 0.28 ng/mL

## 2018-04-28 LAB — GLUCOSE, CAPILLARY
GLUCOSE-CAPILLARY: 150 mg/dL — AB (ref 70–99)
Glucose-Capillary: 113 mg/dL — ABNORMAL HIGH (ref 70–99)
Glucose-Capillary: 193 mg/dL — ABNORMAL HIGH (ref 70–99)
Glucose-Capillary: 253 mg/dL — ABNORMAL HIGH (ref 70–99)
Glucose-Capillary: 171 mg/dL — ABNORMAL HIGH (ref 70–99)

## 2018-04-28 MED ORDER — FUROSEMIDE 10 MG/ML IJ SOLN
40.0000 mg | Freq: Once | INTRAMUSCULAR | Status: AC
  Administered 2018-04-28: 40 mg via INTRAVENOUS
  Filled 2018-04-28: qty 4

## 2018-04-28 MED ORDER — CARVEDILOL 3.125 MG PO TABS
3.1250 mg | ORAL_TABLET | Freq: Two times a day (BID) | ORAL | Status: DC
  Administered 2018-04-28 – 2018-04-30 (×4): 3.125 mg via ORAL
  Filled 2018-04-28 (×3): qty 1

## 2018-04-28 NOTE — Progress Notes (Signed)
Pt not on BIPAP at this time.  

## 2018-04-28 NOTE — Progress Notes (Signed)
NAME:  Jose [name], MRN:  [id number], DOB:  1930/11/09, LOS: 2 ADMISSION DATE:  04/25/2018, CONSULTATION DATE: 04/25/2018 REFERRING MD: Emergency department Dr., CHIEF COMPLAINT: Altered mental status  Brief History   85 year old man admitted from skilled nursing facility with working diagnosis of hcap and associated acute respiratory failure and delirium on 1/24  Past Medical History  Congestive heart failure, dementia, diabetes, hypertension, spinal stenosis, gait disturbance, depression.  Significant Hospital Events   Hypoxemia for which he was subsequently placed on BiPAP-weaned off BiPAP Admitted 1/24, antibiotics initiated, received volume resuscitation.  Admitted to ICU 1/25: Continuing noninvasive positive pressure ventilation 1/26: Doing better, weaned off BiPAP, taking oral nutrition.  Developed a little more respiratory distress during the evening hours but this improved after diuresis 1/27: Oxygen down to 1 L nasal cannula narrowing antibiotics Consults:  None  Procedures:  None  Significant Diagnostic Tests:  Chest x-ray 1/24>> significant for lower lobe infiltrates, worse on the left EKG>> unremarkable for new changes  Micro Data:  Blood cultures 1/24>> coagulase-negative staph Urine cultures 1/24>> E. coli MRSA PCR negative  Antimicrobials:  Vanc 1/24 >> 1/27 Azithromycin 1/24 to 1/27 Cefepime 1/24 >>  Interim history/subjective:   No distress  Objective   Blood pressure 109/63, pulse 69, temperature (Abnormal) 97.3 F (36.3 C), temperature source Axillary, resp. rate (Abnormal) 21, height 6\' 1"  (1.854 m), weight 78.1 kg, SpO2 100 %.    FiO2 (%):  [40 %] 40 %   Intake/Output Summary (Last 24 hours) at 04/28/2018 1001 Last data filed at 04/28/2018 0500 Gross per 24 hour  Intake 1125.02 ml  Output 2000 ml  Net -874.98 ml   Filed Weights   04/26/18 0135 04/27/18 0500 04/28/18 0451  Weight: 78 kg 78.3 kg 78.1 kg    Examination: General: Is a pleasant  85 year old white male resting in bed he is in no acute distress HEENT normocephalic atraumatic no jugular venous distention Pulmonary: Diminished bases no accessory use Cardiac: Regular rate and rhythm without murmur rub or gallop Abdomen: Soft nontender no organomegaly GU: Clear yellow Neuro: Awake, moves all extremities, follows commands, oriented x1. Extremities: Warm dry brisk cap refill no edema Resolved Hospital Problem list   Lactic acidosis AKI resolved as of 1/26 Septic shock Assessment & Plan:  Acute hypoxemic respiratory failure, in setting of RSV, and hcap versus aspiration pneumonia -Urine strep pneumo negative Portable chest x-ray personally reviewed: Low volume film, improved aeration on the left, basilar volume loss on the right. Plan Discontinue vancomycin Wean oxygen Incentive spirometry Mobilize Day #4 azithromycin and cefepime, we can narrow to mono coverage with cefepime only; stop a day 7 SLP eval for dysphasia; wife describes what sounds like chronic aspiration  E. coli UTI Plan Continuing cefepime  History of systolic heart failure with EF 35 to 45% Plan Continuing home Lasix Resume Coreg  Hypernatremia -This is improving with p.o. intake Plan Continuing to allow p.o. intake Repeat a.m. chemistry  Chronic anemia Plan Trend CBC  Diabetes Plan Sliding scale insulin  Dementia, depression Plan Continuing home medications   Best practice:  Diet: Heart healthy Pain/Anxiety/Delirium protocol (if indicated): Not indicated VAP protocol (if indicated): Not indicated DVT prophylaxis: Subcu Lovenox GI prophylaxis: Not indicated Glucose control: SSI Mobility: Bedrest at present Code Status: Full code Family Communication: No family at bedside Disposition: Improving.  Oxygen down to 1 L.  Hemodynamically stable.  I am worried that he may be aspirating.  Will assess OP to see him.  He will  move out of intensive care today  Critical care time:  Not indicated

## 2018-04-28 NOTE — Progress Notes (Signed)
Received order for swallow evaluation.  Upon chart review, pt had an MBS test in December 2018 showing mild oropharyngeal dysphagia with laryngeal penetration/aspiration and residuals. At that time, diet recommendation was dys1/thin with no straw, and assure pt fully upright.  Given wife report of symptoms concerning for aspiration/dysphagia prior to admission, will defer clinical evaluation and proceed with MBS 04/29/2018.  Thanks for this consult.   Luanna Salk, [facility] SLP Acute Rehab Services Pager [phone] Office [phone]

## 2018-04-28 NOTE — Progress Notes (Signed)
eLink Physician-Brief Progress Note Patient Name: Jose [name] DOB: 03/18/1931 MRN: [id number]   Date of Service  04/28/2018  HPI/Events of Note  Hypoxemia - Sat = 88% on [place] O2. RR = 38. Patient 4+ liter positive since admission. LVEF = 35-40%. Patient takes Lasix at home.   eICU Interventions  Will order: 1. Place back on BiPAP. 2. Lasix 40 mg IV X 1 now.      Intervention Category Major Interventions: Hypoxemia - evaluation and management  Maryellen Dowdle [place] 04/28/2018, 12:07 AM

## 2018-04-29 ENCOUNTER — Inpatient Hospital Stay (HOSPITAL_COMMUNITY): Payer: Medicare Other

## 2018-04-29 LAB — GLUCOSE, CAPILLARY
Glucose-Capillary: 131 mg/dL — ABNORMAL HIGH (ref 70–99)
Glucose-Capillary: 180 mg/dL — ABNORMAL HIGH (ref 70–99)
Glucose-Capillary: 202 mg/dL — ABNORMAL HIGH (ref 70–99)
Glucose-Capillary: 205 mg/dL — ABNORMAL HIGH (ref 70–99)

## 2018-04-29 LAB — BASIC METABOLIC PANEL
Anion gap: 7 (ref 5–15)
BUN: 24 mg/dL — AB (ref 8–23)
CO2: 23 mmol/L (ref 22–32)
Calcium: 8.1 mg/dL — ABNORMAL LOW (ref 8.9–10.3)
Chloride: 114 mmol/L — ABNORMAL HIGH (ref 98–111)
Creatinine, Ser: 1.09 mg/dL (ref 0.61–1.24)
GFR calc Af Amer: >60 mL/min (ref >60)
GFR calc non Af Amer: >60 mL/min (ref >60)
Glucose, Bld: 142 mg/dL — ABNORMAL HIGH (ref 70–99)
Potassium: 3.5 mmol/L (ref 3.5–5.1)
Sodium: 144 mmol/L (ref 135–145)

## 2018-04-29 MED ORDER — IPRATROPIUM-ALBUTEROL 0.5-2.5 (3) MG/3ML IN SOLN
3.0000 mL | Freq: Two times a day (BID) | RESPIRATORY_TRACT | Status: DC
  Administered 2018-04-30: 3 mL via RESPIRATORY_TRACT
  Filled 2018-04-29: qty 3

## 2018-04-29 MED ORDER — FUROSEMIDE 20 MG PO TABS
20.0000 mg | ORAL_TABLET | Freq: Every day | ORAL | Status: DC
  Administered 2018-04-30: 20 mg via ORAL
  Filled 2018-04-29: qty 1

## 2018-04-29 MED ORDER — IPRATROPIUM-ALBUTEROL 0.5-2.5 (3) MG/3ML IN SOLN
3.0000 mL | Freq: Four times a day (QID) | RESPIRATORY_TRACT | Status: DC
  Administered 2018-04-29 (×3): 3 mL via RESPIRATORY_TRACT
  Filled 2018-04-29 (×3): qty 3

## 2018-04-29 MED ORDER — SODIUM CHLORIDE 0.9 % IV SOLN
1.0000 g | Freq: Three times a day (TID) | INTRAVENOUS | Status: DC
  Administered 2018-04-29 – 2018-04-30 (×4): 1 g via INTRAVENOUS
  Filled 2018-04-29 (×4): qty 1

## 2018-04-29 NOTE — Progress Notes (Signed)
Modified Barium Swallow Progress Note  Patient Details  Name: Jose [name] MRN: [id number] Date of Birth: 06/29/1930  Today's Date: 04/29/2018  Modified Barium Swallow completed.  Full report located under Chart Review in the Imaging Section.  Brief recommendations include the following:  Clinical Impression  Patient has mild oropharyngeal dysphagia. Thin liquids with cup sip were aspirated silently when bolus presented by cup, by examiner (trace). However no penetration or aspiration were noted when patient administered bolus himself. He had a natural chin tuck, that he initiated after the swallow that did not impact the swallow negatively or positively. All swallows were delayed, initiated at the valleculae with all consistencies and volumes. When patient was administered bolus by examiner, a larger bolus was given and the swallow was initiated at the pyriform sinuses and that is also when the silent aspiration occured. Patient has mild to moderated residue after all bolus types and sizes. Recommend a Regular diet, thin liquids with small bolus sizes. Self administered if possible, followed by a second swallow to attempt to clear residue. Medication whole in puree. Assistence of staff with meals to ensrue second swallow and bolus size/portion.   Swallow Evaluation Recommendations       SLP Diet Recommendations: Regular solids;Thin liquid   Liquid Administration via: Cup;No straw   Medication Administration: Whole meds with puree   Supervision: Full supervision/cueing for compensatory strategies   Compensations: Minimize environmental distractions;Small sips/bites;Slow rate;Multiple dry swallows after each bite/sip   Postural Changes: Remain semi-upright after after feeds/meals (Comment);Seated upright at 90 degrees   Oral Care Recommendations: Oral care BID      [place], MA, CCC-SLP 04/29/2018 1:01 PM

## 2018-04-29 NOTE — Evaluation (Signed)
Physical Therapy Evaluation Patient Details Name: Jose [name] MRN: [id number] DOB: 08-16-1930 Today's Date: 04/29/2018   History of Present Illness  85 year old with past medical history relevant for chronic systolic heart failure with EF of 35 to 40% by echo on 04/26/2018, dementia, type 2 diabetes on insulin, hypertension, who presented to the emergency department on 04/25/2018 from skilled nursing facility with altered mental status and acute hypoxic respiratory failure due to RSV subsequently found to have E. coli UTI, aspiration events.  Clinical Impression  The  Patient required extensive assistance for mobility. Attempted to stand x 3. Per family, patient did  Pivot with 2 assist  I/out of WC earlier. Patient just not putting forth effort. [facility]. Per wife, plans to return. Pt admitted with above diagnosis. Pt currently with functional limitations due to the deficits listed below (see PT Problem List).  Pt will benefit from skilled PT to increase their independence and safety with mobility to allow discharge to the venue listed below.      Follow Up Recommendations SNF;Home health PT(From [facility])    Equipment Recommendations  None recommended by PT    Recommendations for Other Services       Precautions / Restrictions Precautions Precautions: Fall      Mobility  Bed Mobility Overal bed mobility: Needs Assistance Bed Mobility: Rolling;Supine to Sit;Sit to Supine Rolling: +2 for physical assistance;+2 for safety/equipment;Total assist   Supine to sit: Total assist;+2 for physical assistance;+2 for safety/equipment Sit to supine: Total assist;+2 for physical assistance;+2 for safety/equipment   General bed mobility comments: patient offered not assistance for mobility  Transfers Overall transfer level: Needs assistance Equipment used: Rolling walker (2 wheeled) Transfers: Sit to/from Stand           General transfer comment: attempted x  3, patient not standing, barely cleared buttocks  Ambulation/Gait                Stairs            Wheelchair Mobility    Modified Rankin (Stroke Patients Only)       Balance Overall balance assessment: Needs assistance Sitting-balance support: Bilateral upper extremity supported;Feet supported Sitting balance-Leahy Scale: Poor   Postural control: Posterior lean                                   Pertinent Vitals/Pain Pain Assessment: Faces Faces Pain Scale: Hurts little more Pain Location: nodded yes to back pain Pain Intervention(s): Monitored during session;Repositioned    Home Living Family/patient expects to be discharged to:: Skilled nursing facility                 Additional Comments: [facility]    Prior Function Level of Independence: Needs assistance   Gait / Transfers Assistance Needed: recently nonambulatory     Comments: has English as a second language teacher Dominance        Extremity/Trunk Assessment   Upper Extremity Assessment Upper Extremity Assessment: Generalized weakness    Lower Extremity Assessment Lower Extremity Assessment: Generalized weakness    Cervical / Trunk Assessment Cervical / Trunk Assessment: Kyphotic  Communication   Communication: HOH(patient spoke 1 word)  Cognition Arousal/Alertness: Awake/alert Behavior During Therapy: Flat affect Overall Cognitive Status: Impaired/Different from baseline  General Comments: noncommunicative. does not follow directions      General Comments      Exercises     Assessment/Plan    PT Assessment Patient needs continued PT services  PT Problem List Decreased strength;Decreased activity tolerance;Decreased mobility;Decreased knowledge of precautions;Decreased safety awareness;Decreased cognition       PT Treatment Interventions Functional mobility training;Therapeutic  activities;Patient/family education;Therapeutic exercise    PT Goals (Current goals can be found in the Care Plan section)  Acute Rehab PT Goals Patient Stated Goal: to be able to walk again PT Goal Formulation: With family Time For Goal Achievement: 05/13/18 Potential to Achieve Goals: Fair    Frequency Min 2X/week   Barriers to discharge        Co-evaluation               AM-PAC PT "6 Clicks" Mobility  Outcome Measure Help needed turning from your back to your side while in a flat bed without using bedrails?: Total Help needed moving from lying on your back to sitting on the side of a flat bed without using bedrails?: Total Help needed moving to and from a bed to a chair (including a wheelchair)?: Total Help needed standing up from a chair using your arms (e.g., wheelchair or bedside chair)?: Total Help needed to walk in hospital room?: Total Help needed climbing 3-5 steps with a railing? : Total 6 Click Score: 6    End of Session Equipment Utilized During Treatment: Gait belt Activity Tolerance: Patient tolerated treatment well Patient left: in bed;with bed alarm set;with call bell/phone within reach;with family/visitor present Nurse Communication: Mobility status PT Visit Diagnosis: Unsteadiness on feet (R26.81)    Time: 7939-0300 PT Time Calculation (min) (ACUTE ONLY): 47 min   Charges:   PT Evaluation $PT Eval Moderate Complexity: 1 Mod PT Treatments $Gait Training: 8-22 mins $Self Care/Home Management: [place] Pager [phone] Office [phone]   Claretha Cooper 04/29/2018, 5:38 PM

## 2018-04-29 NOTE — Progress Notes (Signed)
PROGRESS NOTE    Jose [name]  AST:[id number] DOB: [birth year] DOA: 04/25/2018 PCP: [name], MD   Brief Narrative:  85 year old with past medical history relevant for chronic systolic heart failure with EF of 35 to 40% by echo on 04/26/2018, dementia, type 2 diabetes on insulin, hypertension, who presented to the emergency department on 04/25/2018 from skilled nursing facility with altered mental status and acute hypoxic respiratory failure due to RSV subsequently found to have E. coli UTI, aspiration events.   Assessment & Plan:   Active Problems:   Pneumonia   #) left lower lobe aspiration pneumonia: This morning while talking to the patient he was grossly aspirating on thin liquids as well as his food.  He was demonstrating a weak cough as well.  He was immediately n.p.o.  Per review of the chart patient apparently is intermittently required BiPAP but currently he is not in particular respiratory distress.  Is felt the patient's pneumonia was secondary to weak oropharyngeal swallowing in the setting of an acute illness.  Is unclear how much his dementia is playing a role in this. -Pending speech-language pathology modified barium swallow -N.p.o. pending recommendations -Continue IV cefepime started 04/25/2018  #) Altered mental status/E. coli UTI: Patient's altered mentation was either the result of his aspiration, his RSV pneumonia, or this possible E. coli UTI or combination of the both.  His metabolic encephalopathy is now resolved.  He does have baseline dementia. -Continue IV cefepime per above pansensitive E. coli  #) RSV pneumonia/acute hypoxic respiratory failure: This appears to be largely resolved unfortunately patient has developed aspiration pneumonia in the interim.  #) Hypertension/hyperlipidemia/chronic systolic heart failure: Echo performed during this hospitalization showed EF of 35 to 40%. -Judicious fluid use, will hold on IV fluids while patient is  n.p.o. -Continue aspirin 81 mg -Continue carvedilol 3.125 mg - Continue lisinopril 5 mg -Continue simvastatin 5 mg daily -Hold furosemide 20 mg daily  #) Type 2 diabetes on insulin: Currently patient is n.p.o. pending speech eval -Hold home glargine 10 units nightly -Hold metformin 5 mg twice daily -Hold sitagliptin 25 mg daily -Sliding scale insulin, AC at bedtime  #) Dementia/pain/psych: -Continue bupropion 100 mg daily - Continue memantine 20 mg daily -Continue donepezil 23 mg daily  Fluids: Hold Electrolytes: Monitor and supplement Nutrition: Per above  Prophylaxis: Enoxaparin  Disposition: Pending swallow eval and possible discussion of goals of care  Full code  Consultants:   PCCM  Procedures:   None  Antimicrobials:   IV cefepime started 04/25/2018 ongoing  Azithromycin 04/25/2018 to 04/28/2018  Vancomycin 04/25/2018 to 04/28/2018   Subjective: This morning while going to evaluate the patient he was eating breakfast and grossly aspirating on thin liquids.  He was immediately made n.p.o. pending a speech-language pathology evaluation as he is pending a modified barium swallow today.  He denies any nausea, vomiting, diarrhea.  His caretaker and there reports that he is otherwise had an unremarkable night.  Objective: Vitals:   04/28/18 2052 04/28/18 2117 04/29/18 0601 04/29/18 0952  BP: 127/63  129/61   Pulse: 76  77 74  Resp: 18  18 18   Temp: 97.8 F (36.6 C)  97.7 F (36.5 C)   TempSrc: Oral  Oral   SpO2: 100% 94% 96% 94%  Weight:      Height:        Intake/Output Summary (Last 24 hours) at 04/29/2018 0956 Last data filed at 04/29/2018 0600 Gross per 24 hour  Intake 360 ml  Output 1175 ml  Net -815 ml   Filed Weights   04/26/18 0135 04/27/18 0500 04/28/18 0451  Weight: 78 kg 78.3 kg 78.1 kg    Examination:  General exam: Appears calm and comfortable  Respiratory system: No increased work of breathing, scattered rhonchi particularly worse  on left, no wheezes or crackles Cardiovascular system: Regular rate and rhythm, no murmurs Gastrointestinal system: Soft, nondistended, no rebound or guarding, plus bowel sounds Central nervous system: Alert but not oriented, grossly intact, moving all extremities Extremities: Trace lower extremity edema Skin: No rashes over visible skin Psychiatry: Unable to assess due to medical condition    Data Reviewed: I have personally reviewed following labs and imaging studies  CBC: Recent Labs  Lab 04/25/18 1853 04/26/18 0316 04/27/18 0321 04/27/18 1122  WBC 9.2 7.5 5.4 5.9  NEUTROABS 7.6  --  3.7 4.5  HGB 8.7* 7.6* 8.1* 8.2*  HCT 30.2* 27.0* 28.5* 29.1*  MCV 89.9 94.1 92.8 94.8  PLT 137* 112* 128* 376*   Basic Metabolic Panel: Recent Labs  Lab 04/25/18 1853 04/26/18 0316 04/27/18 0321 04/27/18 1122 04/29/18 0414  NA 148* 148* 149* 148* 144  K 3.5 3.7 3.5 3.6 3.5  CL 117* 115* 119* 116* 114*  CO2 23 26 24 25 23   GLUCOSE 206* 168* 103* 237* 142*  BUN 36* 34* 23 23 24*  CREATININE 1.69* 1.32* 1.11 1.15 1.09  CALCIUM 8.4* 8.2* 8.3* 8.4* 8.1*  MG  --  2.2  --   --   --   PHOS  --  3.3  --   --   --    GFR: Estimated Creatinine Clearance: 54.7 mL/min (by C-G formula based on SCr of 1.09 mg/dL). Liver Function Tests: Recent Labs  Lab 04/25/18 1853 04/27/18 0321  AST 19 44*  ALT 16 40  ALKPHOS 75 91  BILITOT 0.4 0.5  PROT 6.3* 5.5*  ALBUMIN 3.0* 2.5*   No results for input(s): LIPASE, AMYLASE in the last 168 hours. No results for input(s): AMMONIA in the last 168 hours. Coagulation Profile: No results for input(s): INR, PROTIME in the last 168 hours. Cardiac Enzymes: Recent Labs  Lab 04/26/18 0003 04/26/18 0316 04/26/18 1203  TROPONINI 0.04* 0.07* 0.06*   BNP (last 3 results) No results for input(s): PROBNP in the last 8760 hours. HbA1C: No results for input(s): HGBA1C in the last 72 hours. CBG: Recent Labs  Lab 04/28/18 1206 04/28/18 1400  04/28/18 1716 04/28/18 2139 04/29/18 0732  GLUCAP 193* 150* 253* 171* 131*   Lipid Profile: No results for input(s): CHOL, HDL, LDLCALC, TRIG, CHOLHDL, LDLDIRECT in the last 72 hours. Thyroid Function Tests: No results for input(s): TSH, T4TOTAL, FREET4, T3FREE, THYROIDAB in the last 72 hours. Anemia Panel: No results for input(s): VITAMINB12, FOLATE, FERRITIN, TIBC, IRON, RETICCTPCT in the last 72 hours. Sepsis Labs: Recent Labs  Lab 04/25/18 1855 04/25/18 2130 04/26/18 1055 04/27/18 0009 04/27/18 0321 04/28/18 0314  PROCALCITON  --   --  0.51  --  0.45 0.28  LATICACIDVEN 2.3* 4.6*  --  1.0 1.2  --     Recent Results (from the past 240 hour(s))  Culture, blood (routine x 2)     Status: Abnormal   Collection Time: 04/25/18  6:54 PM  Result Value Ref Range Status   Specimen Description   Final    BLOOD LEFT FOREARM Performed at [facility], [place] [address], [place], [place] [address]    Special Requests   Final  BOTTLES DRAWN AEROBIC AND ANAEROBIC Blood Culture results may not be optimal due to an excessive volume of blood received in culture bottles Performed at [facility], [place] [address], [place], [place] [address]    Culture  Setup Time   Final    GRAM POSITIVE COCCI ANAEROBIC BOTTLE ONLY CRITICAL RESULT CALLED TO, READ BACK BY AND VERIFIED WITH: B.GREEN,PHARMD AT 0046 ON 04/27/18 BY G.MCADOO    Culture (A)  Final    STAPHYLOCOCCUS SPECIES (COAGULASE NEGATIVE) THE SIGNIFICANCE OF ISOLATING THIS ORGANISM FROM A SINGLE SET OF BLOOD CULTURES WHEN MULTIPLE SETS ARE DRAWN IS UNCERTAIN. PLEASE NOTIFY THE MICROBIOLOGY DEPARTMENT WITHIN ONE WEEK IF SPECIATION AND SENSITIVITIES ARE REQUIRED. Performed at [facility], [place] [address], [place], [place] [address]    Report Status 04/28/2018 FINAL  Final  Blood Culture ID Panel (Reflexed)     Status: Abnormal   Collection Time: 04/25/18  6:54 PM  Result Value Ref Range Status   Enterococcus  species NOT DETECTED NOT DETECTED Final   Listeria monocytogenes NOT DETECTED NOT DETECTED Final   Staphylococcus species DETECTED (A) NOT DETECTED Final    Comment: Methicillin (oxacillin) susceptible coagulase negative staphylococcus. Possible blood culture contaminant (unless isolated from more than one blood culture draw or clinical case suggests pathogenicity). No antibiotic treatment is indicated for blood  culture contaminants. CRITICAL RESULT CALLED TO, READ BACK BY AND VERIFIED WITH: B.GREEN,PHARMD AT 0046 ON 04/27/18 BY G.MCADOO    Staphylococcus aureus (BCID) NOT DETECTED NOT DETECTED Final   Methicillin resistance NOT DETECTED NOT DETECTED Final   Streptococcus species NOT DETECTED NOT DETECTED Final   Streptococcus agalactiae NOT DETECTED NOT DETECTED Corrected    Comment: CORRECTED ON 01/26 AT 0102: PREVIOUSLY REPORTED AS NOT DETECTED CRITICAL RESULT CALLED TO, READ BACK BY AND VERIFIED WITH: B.GREEN,PHARMD AT 0046 ON 04/27/18 BY G.MCADOO   Streptococcus pneumoniae NOT DETECTED NOT DETECTED Final   Streptococcus pyogenes NOT DETECTED NOT DETECTED Final   Acinetobacter baumannii NOT DETECTED NOT DETECTED Final   Enterobacteriaceae species NOT DETECTED NOT DETECTED Final   Enterobacter cloacae complex NOT DETECTED NOT DETECTED Final   Escherichia coli NOT DETECTED NOT DETECTED Final   Klebsiella oxytoca NOT DETECTED NOT DETECTED Final   Klebsiella pneumoniae NOT DETECTED NOT DETECTED Final   Proteus species NOT DETECTED NOT DETECTED Final   Serratia marcescens NOT DETECTED NOT DETECTED Final   Haemophilus influenzae NOT DETECTED NOT DETECTED Final   Neisseria meningitidis NOT DETECTED NOT DETECTED Final   Pseudomonas aeruginosa NOT DETECTED NOT DETECTED Final   Candida albicans NOT DETECTED NOT DETECTED Final   Candida glabrata NOT DETECTED NOT DETECTED Final   Candida krusei NOT DETECTED NOT DETECTED Final   Candida parapsilosis NOT DETECTED NOT DETECTED Final   Candida  tropicalis NOT DETECTED NOT DETECTED Final    Comment: Performed at [facility], [place] [address], [place], [place] [address]  Culture, blood (routine x 2)     Status: None (Preliminary result)   Collection Time: 04/25/18  7:18 PM  Result Value Ref Range Status   Specimen Description   Final    BLOOD RIGHT HAND Performed at [facility], [place] [address], [place], [place] [address]    Special Requests   Final    BOTTLES DRAWN AEROBIC AND ANAEROBIC Blood Culture adequate volume Performed at [place] [address], [place], [place] [address]    Culture   Final    NO GROWTH  2 DAYS Performed at [facility], [place] [address] [place], [place] [address]    Report Status PENDING  Incomplete  Urine culture     Status: Abnormal   Collection Time: 04/25/18  8:52 PM  Result Value Ref Range Status   Specimen Description URINE, RANDOM  Final   Special Requests   Final    NONE Performed at [place] [address] [place], [place] [address]    Culture >=100,000 COLONIES/mL ESCHERICHIA COLI (A)  Final   Report Status 04/28/2018 FINAL  Final   Organism ID, Bacteria ESCHERICHIA COLI (A)  Final      Susceptibility   Escherichia coli - MIC*    AMPICILLIN <=2 SENSITIVE Sensitive     CEFAZOLIN <=4 SENSITIVE Sensitive     CEFTRIAXONE <=1 SENSITIVE Sensitive     CIPROFLOXACIN <=0.25 SENSITIVE Sensitive     GENTAMICIN <=1 SENSITIVE Sensitive     IMIPENEM <=0.25 SENSITIVE Sensitive     NITROFURANTOIN <=16 SENSITIVE Sensitive     TRIMETH/SULFA <=20 SENSITIVE Sensitive     AMPICILLIN/SULBACTAM <=2 SENSITIVE Sensitive     PIP/TAZO <=4 SENSITIVE Sensitive     Extended ESBL NEGATIVE Sensitive     * >=100,000 COLONIES/mL ESCHERICHIA COLI  Respiratory Panel by PCR     Status: Abnormal   Collection Time: 04/26/18 12:28 AM  Result Value Ref Range Status   Adenovirus NOT DETECTED NOT DETECTED Final   Coronavirus 229E NOT DETECTED NOT DETECTED  Final   Coronavirus HKU1 NOT DETECTED NOT DETECTED Final   Coronavirus NL63 NOT DETECTED NOT DETECTED Final   Coronavirus OC43 NOT DETECTED NOT DETECTED Final   Metapneumovirus NOT DETECTED NOT DETECTED Final   Rhinovirus / Enterovirus NOT DETECTED NOT DETECTED Final   Influenza A NOT DETECTED NOT DETECTED Final   Influenza B NOT DETECTED NOT DETECTED Final   Parainfluenza Virus 1 NOT DETECTED NOT DETECTED Final   Parainfluenza Virus 2 NOT DETECTED NOT DETECTED Final   Parainfluenza Virus 3 NOT DETECTED NOT DETECTED Final   Parainfluenza Virus 4 NOT DETECTED NOT DETECTED Final   Respiratory Syncytial Virus DETECTED (A) NOT DETECTED Final    Comment: CRITICAL RESULT CALLED TO, READ BACK BY AND VERIFIED WITH: RN B MAY [id number] MLM    Bordetella pertussis NOT DETECTED NOT DETECTED Final   Chlamydophila pneumoniae NOT DETECTED NOT DETECTED Final   Mycoplasma pneumoniae NOT DETECTED NOT DETECTED Final    Comment: Performed at [facility], [place] [address], [place], [place] [address]  MRSA PCR Screening     Status: None   Collection Time: 04/26/18  2:05 AM  Result Value Ref Range Status   MRSA by PCR NEGATIVE NEGATIVE Final    Comment:        The GeneXpert MRSA Assay (FDA approved for NASAL specimens only), is one component of a comprehensive MRSA colonization surveillance program. It is not intended to diagnose MRSA infection nor to guide or monitor treatment for MRSA infections. Performed at [facility], [place] [address] [place], [place] [address]          Radiology Studies: Dg Chest Port 1 View  Result Date: 04/28/2018 CLINICAL DATA:  Respiratory failure. EXAM: PORTABLE CHEST 1 VIEW COMPARISON:  04/25/2018 FINDINGS: The cardiomediastinal silhouette is unchanged with upper limits of normal heart size. There is persistent elevation of the right hemidiaphragm with overall low lung volumes. Retrocardiac opacity in the left lower lobe has decreased from  the prior  study and likely represents atelectasis. The right lung remains grossly clear. No sizable pleural effusion or pneumothorax is identified. IMPRESSION: Improved left lower lobe aeration. Electronically Signed   By: Logan Bores M.D.   On: 04/28/2018 07:18        Scheduled Meds: . aspirin  81 mg Oral Daily  . buPROPion  100 mg Oral Daily  . carvedilol  3.125 mg Oral BID WC  . cholecalciferol  1,000 Units Oral Daily  . donepezil  23 mg Oral Daily  . dorzolamide-timolol  1 drop Left Eye BID  . enoxaparin (LOVENOX) injection  40 mg Subcutaneous Daily  . ferrous sulfate  325 mg Oral Q breakfast  . folic acid  194 mcg Oral QPM  . insulin aspart  0-5 Units Subcutaneous QHS  . insulin aspart  0-9 Units Subcutaneous TID WC  . ipratropium-albuterol  3 mL Nebulization Q6H WA  . mouth rinse  15 mL Mouth Rinse BID  . memantine  28 mg Oral Daily  . multivitamin with minerals  1 tablet Oral Daily  . simvastatin  5 mg Oral q1800   Continuous Infusions: . ceFEPime (MAXIPIME) IV 1 g (04/28/18 2355)     LOS: 3 days    Time spent: [place], MD [facility]  If 7PM-7AM, please contact night-coverage [email or web address] Password [facility] 04/29/2018, 9:56 AM

## 2018-04-29 NOTE — Progress Notes (Signed)
Pharmacy Antibiotic Note  Jose [name] is a 85 y.o. male admitted on 04/25/2018 with pneumonia.  Pharmacy has been consulted for cefepime dosing. 04/29/2018 # 4/7 full day abx. AF.Scr 1.09  Plan: Increase Cefepime 1 gm IV q12 to 1 gm q8h Consider changing to PO to complete 7 day course - Omnicef 300 po bid  Height: 6\' 1"  (185.4 cm) Weight: 172 lb 2.9 oz (78.1 kg) IBW/kg (Calculated) : 79.9  Temp (24hrs), Avg:97.7 F (36.5 C), Min:97.7 F (36.5 C), Max:97.8 F (36.6 C)  Recent Labs  Lab 04/25/18 1853 04/25/18 1855 04/25/18 2130 04/26/18 0316 04/27/18 0009 04/27/18 0321 04/27/18 1122 04/29/18 0414  WBC 9.2  --   --  7.5  --  5.4 5.9  --   CREATININE 1.69*  --   --  1.32*  --  1.11 1.15 1.09  LATICACIDVEN  --  2.3* 4.6*  --  1.0 1.2  --   --     Estimated Creatinine Clearance: 54.7 mL/min (by C-G formula based on SCr of 1.09 mg/dL).    Allergies  Allergen Reactions  . Axona [Bacid]     Unknown per MAR  . Gabapentin     Hallucinations   Antimicrobials this admission: 1/24 cefepime >>  1/24 vancomycin >> 1/25 1/24 Azithromycin >> 1/27  Dose adjustments this admission: 1/28 cefepime 1 gm q12>>1 gm q8 Microbiology results: 1/24 BCx: GPC anaerobic bottle only (1/4)      1/26: BCID 1 of 4 staph species, suspect contaminant 1/24 UCx:  >100K E. Coli (pan-susc) 1/25 Respiratory panel: +Respiratory Syncytial Virus 1/25 MRSA PCR: neg  Thank you for allowing pharmacy to be a part of this patient's care.  Eudelia Bunch, Pharm.D [phone] 04/29/2018 11:46 AM

## 2018-04-29 NOTE — Care Management Note (Signed)
Case Management Note  Patient Details  Name: Jose [name] MRN: [id number] Date of Birth: 1930-06-02  Subjective/Objective: Pna. From [facility]-has custodial level asst-[facility]. PT cons-await recc.                   Action/Plan:dc SNF.   Expected Discharge Date:  (unknown)               Expected Discharge Plan:  Skilled Nursing Facility  In-House Referral:  Clinical Social Work  Discharge planning Services  CM Consult  Post Acute Care Choice:    Choice offered to:     DME Arranged:    DME Agency:     HH Arranged:    [place] Agency:     Status of Service:  In process, will continue to follow  If discussed at Long Length of Stay Meetings, dates discussed:    Additional Comments:  Dessa Phi, RN 04/29/2018, 11:16 AM

## 2018-04-29 NOTE — Care Management Important Message (Signed)
Important Message  Patient Details  Name: UZAIR GODLEY MRN: [id number] Date of Birth: 1930-05-29   Medicare Important Message Given:  Yes    Kerin Salen 04/29/2018, 11:03 AMImportant Message  Patient Details  Name: FRANKIE SCIPIO MRN: [id number] Date of Birth: May 11, 1930   Medicare Important Message Given:  Yes    Kerin Salen 04/29/2018, 11:03 AM

## 2018-04-30 DIAGNOSIS — N390 Urinary tract infection, site not specified: Secondary | ICD-10-CM | POA: Diagnosis present

## 2018-04-30 DIAGNOSIS — J9601 Acute respiratory failure with hypoxia: Secondary | ICD-10-CM

## 2018-04-30 DIAGNOSIS — E1122 Type 2 diabetes mellitus with diabetic chronic kidney disease: Secondary | ICD-10-CM

## 2018-04-30 DIAGNOSIS — N3000 Acute cystitis without hematuria: Secondary | ICD-10-CM

## 2018-04-30 DIAGNOSIS — R1312 Dysphagia, oropharyngeal phase: Secondary | ICD-10-CM

## 2018-04-30 DIAGNOSIS — F0390 Unspecified dementia without behavioral disturbance: Secondary | ICD-10-CM

## 2018-04-30 DIAGNOSIS — J690 Pneumonitis due to inhalation of food and vomit: Secondary | ICD-10-CM

## 2018-04-30 DIAGNOSIS — I10 Essential (primary) hypertension: Secondary | ICD-10-CM

## 2018-04-30 LAB — BASIC METABOLIC PANEL
Anion gap: 4 — ABNORMAL LOW (ref 5–15)
BUN: 16 mg/dL (ref 8–23)
CO2: 23 mmol/L (ref 22–32)
Calcium: 8.3 mg/dL — ABNORMAL LOW (ref 8.9–10.3)
Creatinine, Ser: 0.95 mg/dL (ref 0.61–1.24)
GFR calc Af Amer: >60 mL/min (ref >60)
GFR calc non Af Amer: >60 mL/min (ref >60)
Glucose, Bld: 133 mg/dL — ABNORMAL HIGH (ref 70–99)
Sodium: 145 mmol/L (ref 135–145)

## 2018-04-30 LAB — CBC
HCT: 28.8 % — ABNORMAL LOW (ref 39.0–52.0)
Hemoglobin: 8.2 g/dL — ABNORMAL LOW (ref 13.0–17.0)
MCH: 25.9 pg — ABNORMAL LOW (ref 26.0–34.0)
MCHC: 28.5 g/dL — ABNORMAL LOW (ref 30.0–36.0)
MCV: 91.1 fL (ref 80.0–100.0)
Platelets: 143 10*3/uL — ABNORMAL LOW (ref 150–400)
RBC: 3.16 MIL/uL — ABNORMAL LOW (ref 4.22–5.81)
RDW: 17.8 % — ABNORMAL HIGH (ref 11.5–15.5)
WBC: 8.8 10*3/uL (ref 4.0–10.5)
nRBC: 0 % (ref 0.0–0.2)

## 2018-04-30 LAB — GLUCOSE, CAPILLARY
Glucose-Capillary: 122 mg/dL — ABNORMAL HIGH (ref 70–99)
Glucose-Capillary: 179 mg/dL — ABNORMAL HIGH (ref 70–99)
Glucose-Capillary: 161 mg/dL — ABNORMAL HIGH (ref 70–99)

## 2018-04-30 LAB — BASIC METABOLIC PANEL WITH GFR
Chloride: 118 mmol/L — ABNORMAL HIGH (ref 98–111)
Potassium: 3.6 mmol/L (ref 3.5–5.1)

## 2018-04-30 MED ORDER — FUROSEMIDE 20 MG PO TABS: 20.0000 mg | ORAL_TABLET | Freq: Every day | ORAL | 1 refills | Status: DC

## 2018-04-30 MED ORDER — CEFDINIR 300 MG PO CAPS: 300.0000 mg | ORAL_CAPSULE | Freq: Two times a day (BID) | ORAL | 0 refills | Status: DC

## 2018-04-30 MED ORDER — CEFDINIR 300 MG PO CAPS
300.0000 mg | ORAL_CAPSULE | Freq: Two times a day (BID) | ORAL | Status: DC
  Filled 2018-04-30: qty 1

## 2018-04-30 NOTE — Progress Notes (Signed)
  Speech Language Pathology Treatment: Dysphagia  Patient Details Name: Jose [name] MRN: [id number] DOB: 05-21-1930 Today's Date: 04/30/2018 Time: 1300-1330 SLP Time Calculation (min) (ACUTE ONLY): 30 min  Assessment / Plan / Recommendation Clinical Impression  Pt seen at bedside for follow up after MBS completed 04/29/2018. Wife and daughter present for education. Pt was being fed lunch upon arrival of SLP. Cough noted throughout session, however, wife and daughter report he has had this cough for several weeks. Pt is at increased aspiration risk, based on MBS results.   Pt's wife and daughter were provided with safe swallow strategies, including suggestions for minimizing aspiration risk and maximizing energy conservation. Soft chopped solids are recommended, primarily for energy conservation. Thin liquids via small individual cup sip, meds in puree. Pt was encouraged to sit upright for meals, and remain between 45 and 90 degrees upright for 30 minutes after meals. Written information was provided to pt's wife to take at DC.   Continued ST intervention is recommended at next venue for education and assessment of diet tolerance.     HPI HPI: 85 year old man with history of CHF, dementia, DM, HTN, iron deficiency anemia presenting with altered mental status. Unable to obtain history from patient due to acute encephalopathy and underlying dementia. Marland Kitchen He has been having respiratory infection symptoms for the past 1.5 to 2 weeks, including cough that sounds productive. Patient had MBS 03/2017 with mild oropharyngeal dysphagia and discharged with Dys 1, thin liquid diet.       SLP Plan  (Pt being DC'd from acute care. Recommend continued ST at next venue)       Recommendations  Diet recommendations: Dysphagia 3 (mechanical soft);Thin liquid Liquids provided via: No straw;Cup Medication Administration: Whole meds with puree Supervision: Staff to assist with self feeding;Patient able to self  feed;Full supervision/cueing for compensatory strategies Compensations: Minimize environmental distractions;Small sips/bites;Slow rate;Multiple dry swallows after each bite/sip Postural Changes and/or Swallow Maneuvers: Out of bed for meals;Upright 30-60 min after meal;Seated upright 90 degrees                Oral Care Recommendations: Oral care before and after PO Follow up Recommendations: 24 hour supervision/assistance;Skilled Nursing facility SLP Visit Diagnosis: Dysphagia, oropharyngeal phase (R13.12) Plan: (Pt being DC'd. Recommend continued ST at next venue)       [place] B. Quentin Ore [facility] CCC-SLP Speech Language Pathologist [phone]  Shonna Chock 04/30/2018, 2:05 PM

## 2018-04-30 NOTE — Discharge Summary (Addendum)
Physician Discharge Summary  Patient ID: Jose [name] MRN: [id number] DOB/AGE: 1930/12/17 85 y.o.  Admit date: 04/25/2018 Discharge date: 04/30/2018  Admission Diagnoses:   Pneumonia aspiration   Altered mental status   Acute hypoxic resp failure   HTN   Chronic systolic heart failure   Hyperlipidemia   Dementia    Chronic pain  Discharge Diagnoses:    Pneumonia aspiration   Altered mental status   EColi UTI   RSV lung infection   Acute hypoxic resp failure   HTN   Chronic systolic heart failure   Hyperlipidemia   Dementia    Chronic pain   Discharged Condition: fair  Presentation Summary: 85 year old with past medical history relevant for chronic systolic heart failure with EF of 35 to 40% by echo on 04/26/2018, dementia, type 2 diabetes on insulin, hypertension, who presented to the emergency department on 04/25/2018 from skilled nursing facility with altered mental status and acute hypoxic respiratory failure due to RSV subsequently found to have E. coli UTI, aspiration events.   Hospital Course: #) left lower lobe aspiration pneumonia: This morning while talking to the patient he was grossly aspirating on thin liquids as well as his food.  He was demonstrating a weak cough as well.  He was npo but then improved and passed the swallow eval w/ recommendations as below.  He was afebrile for 4 days prior to dc.  - got 5 days IV cefepime here , will get 2 days of Omnicef 300 bid to complete a 7 day course of abx - family aware he is at high risk for recurrent PNA given his debility/ dementia and swallowing issues along w/ other comorbidities  #) Altered mental status/E. coli UTI: Patient's altered mentation was either the result of his aspiration, his RSV pneumonia, or this possible E. coli UTI or combination of the both.  His metabolic encephalopathy is now resolved.  He does have baseline dementia.   - got 5 days IV cefepime here  - will get 2 days of Omnicef 300 bid to  complete a 7 day course of abx  #) RSV pneumonia/acute hypoxic respiratory failure: This appears to be largely resolved unfortunately patient has developed aspiration pneumonia in the interim.  #) Hypertension/hyperlipidemia/chronic systolic heart failure: Echo performed during this hospitalization showed EF of 35 to 40%. No decomp CHF while here.  -Continue aspirin 81 mg -Continue carvedilol 3.125 mg - Continue lisinopril 5 mg -Continue simvastatin 5 mg daily -Hold furosemide 20 mg daily for 5 days then resume  #) Type 2 diabetes on insulin:  -resume home glargine 10 units nightly -resume metformin 5 mg twice daily -resume sitagliptin 25 mg daily  #) Dementia/pain/psych: - Continue bupropion 100 mg daily - Continue memantine 20 mg daily - Continue donepezil 23 mg daily  #) Dysphagia: seen by Speech [facility], passed swallow eval rec's as follows > - Regular solids;Thin liquid - Liquid Administration via: Cup;No straw - Medication Administration: Whole meds with puree - Supervision: Full supervision/cueing for compensatory strategies - Compensations: Minimize environmental distractions; Small sips/bites; Slow rate; Multiple dry swallows after each bite/sip - Postural Changes: Remain semi-upright after after feeds/meals; Seated upright at 90 degrees   Oral Care Recommendations: Oral care BID  Consultants:   PCCM  Procedures:   None  Antimicrobials:   IV cefepime started 04/25/2018 ongoing  Azithromycin 04/25/2018 to 04/28/2018  Vancomycin 04/25/2018 to 04/28/2018   Discharge Exam: Blood pressure (!) 144/72, pulse 86, temperature 97.8 F (36.6 C), temperature source Oral,  resp. rate 16, height 6\' 1"  (1.854 m), weight 80.8 kg, SpO2 97 %. General exam: Appears calm and comfortable , talks minimally, tired Respiratory system: clear ant / post bialt, no ^wob Cardiovascular system: Regular rate and rhythm, no murmurs Gastrointestinal system: Soft, nondistended, no rebound  or guarding, plus BS Central nervous system: Alert but not oriented, grossly intact, moving all extremities, very weak Extremities: Trace lower extremity edema Skin: No rashes over visible skin Psychiatry: Unable to assess due to medical condition  Disposition: Discharge disposition: 03-Skilled Nursing Facility        Allergies as of 04/30/2018      Reactions   Axona [bacid]    Unknown per MAR   Gabapentin    Hallucinations      Medication List    TAKE these medications   ALIGN 4 MG Caps Take 4 mg by mouth daily.   buPROPion 100 MG 12 hr tablet Commonly known as:  WELLBUTRIN SR Take 100 mg by mouth daily.   carvedilol 3.125 MG tablet Commonly known as:  COREG Take 1 tablet (3.125 mg total) by mouth 2 (two) times daily with a meal.   cefdinir 300 MG capsule Commonly known as:  OMNICEF Take 1 capsule (300 mg total) by mouth every 12 (twelve) hours for 2 days.   CERAVE Crea Apply thin layer topically to arms, legs, and trunk once daily as needed   docusate sodium 100 MG capsule Commonly known as:  COLACE Take 100 mg by mouth daily.   donepezil 23 MG Tabs tablet Commonly known as:  ARICEPT Take 1 tablet (23 mg total) by mouth daily.   dorzolamide-timolol 22.3-6.8 MG/ML ophthalmic solution Commonly known as:  COSOPT Place 1 drop into the left eye 2 (two) times daily.   ferrous sulfate 325 (65 FE) MG tablet Take 325 mg by mouth daily with breakfast.   Flaxseed Oil 1000 MG Caps Take 1,000 mg by mouth daily.   folic acid 030 MCG tablet Commonly known as:  FOLVITE Take 800 mcg by mouth every evening.   furosemide 20 MG tablet Commonly known as:  LASIX Take 1 tablet (20 mg total) by mouth daily. Start taking on:  May 05, 2018 What changed:  These instructions start on May 05, 2018. If you are unsure what to do until then, ask your doctor or other care provider.   guaifenesin 100 MG/5ML syrup Commonly known as:  ROBITUSSIN Take 200 mg by mouth  every 12 (twelve) hours.   hydrocortisone 2.5 % rectal cream Commonly known as:  ANUSOL-HC Place 1 application rectally at bedtime as needed for hemorrhoids or anal itching.   insulin glargine 100 UNIT/ML injection Commonly known as:  LANTUS Inject 10 Units into the skin at bedtime.   lisinopril 5 MG tablet Commonly known as:  PRINIVIL,ZESTRIL Take 1 tablet (5 mg total) by mouth daily.   memantine 28 MG Cp24 24 hr capsule Commonly known as:  NAMENDA XR Take 1 capsule (28 mg total) by mouth daily.   metFORMIN 500 MG tablet Commonly known as:  GLUCOPHAGE Take 500 mg by mouth 2 (two) times daily with a meal.   multivitamin tablet Take 1 tablet by mouth daily.   nystatin cream Commonly known as:  MYCOSTATIN Apply 1 application topically 2 (two) times daily as needed for dry skin. Apply to redness on  scrotum/sacral areas   simvastatin 5 MG tablet Commonly known as:  ZOCOR Take 5 mg by mouth daily.   sitaGLIPtin 25 MG tablet Commonly  known as:  JANUVIA Take 1 tablet (25 mg total) by mouth daily.   Vitamin D 1000 units capsule Take 1,000 Units by mouth daily.   zinc oxide 20 % ointment Apply 1 application topically 3 (three) times daily as needed for irritation.        Signed: Sandy Salaam Razan Siler 04/30/2018, 12:55 PM

## 2018-04-30 NOTE — Progress Notes (Addendum)
RN caled [phone] twice to give report on Mr Brickle and no response or answering service was available. Report was sent with PTAR for review by nursing staff at [facility] room 36.  Report given to Barbaraann Rondo at [phone] at [facility]

## 2018-04-30 NOTE — Progress Notes (Signed)
CSW spoke with spouse via phone. Per spouse she would like patient to return back to [facility] spoke with [facility] admission coordinator for [facility] stated she will be able to take patient back as long as authorization through insurance has been obtain. CSW will follow back up with facility once insurance has approved patients return back to facility.   Rhea Pink, MSW,  [place]

## 2018-04-30 NOTE — Clinical Social Work Placement (Signed)
   CLINICAL SOCIAL WORK PLACEMENT  NOTE  Date:  04/30/2018  Patient Details  Name: Jose [name] MRN: [id number] Date of Birth: Aug 12, 1930  Clinical Social Work is seeking post-discharge placement for this patient at the [place] level of care (*CSW will initial, date and re-position this form in  chart as items are completed):      Patient/family provided with [facility] Work Department's list of facilities offering this level of care within the geographic area requested by the patient (or if unable, by the patient's family).      Patient/family informed of their freedom to choose among providers that offer the needed level of care, that participate in Medicare, Medicaid or managed care program needed by the patient, have an available bed and are willing to accept the patient.      Patient/family informed of [facility]'s ownership interest in [facility] and [facility], as well as of the fact that they are under no obligation to receive care at these facilities.  PASRR submitted to EDS on       PASRR number received on       Existing PASRR number confirmed on 04/30/18     FL2 transmitted to all facilities in geographic area requested by pt/family on 04/30/18     FL2 transmitted to all facilities within larger geographic area on       Patient informed that his/her managed care company has contracts with or will negotiate with certain facilities, including the following:            Patient/family informed of bed offers received.  Patient chooses bed at [facility]     Physician recommends and patient chooses bed at      Patient to be transferred to [facility] on 04/30/18.  Patient to be transferred to facility by ptar     Patient family notified on 04/30/18 of transfer.  Name of family member notified:  spoke to spouse     PHYSICIAN       Additional Comment:     _______________________________________________ Wende Neighbors, LCSW 04/30/2018, 4:08 PM

## 2018-04-30 NOTE — Progress Notes (Signed)
Clinical Social Worker facilitated patient discharge including contacting patient family and facility to confirm patient discharge plans.  Clinical information faxed to facility and family agreeable with plan.  CSW arranged ambulance transport via PTAR to [facility] .  RN to call [phone] ext: [phone] (pt will go in rm# 36 Health care) for report prior to discharge.  Clinical Social Worker will sign off for now as social work intervention is no longer needed. Please consult [place] again if new need arises.  Rhea Pink, MSW, [place]

## 2018-04-30 NOTE — NC FL2 (Signed)
[place] LEVEL OF CARE SCREENING TOOL     IDENTIFICATION  Patient Name: Jose [name] Birthdate: [birth year] Sex: male Admission Date (Current Location): 04/25/2018  [facility] and [place] Number:  Herbalist and Address:  [facility],  [address] [place], [place]      Provider Number: [id number]  Attending Physician Name and Address:  Roney Jaffe, MD  Relative Name and Phone Number:  Raeford Brandenburg, [phone]    Current Level of Care: Hospital Recommended Level of Care: [place] Prior Approval Number:    Date Approved/Denied:   PASRR Number: [id number]  Discharge Plan: SNF    Current Diagnoses: Patient Active Problem List   Diagnosis Date Noted  . Pneumonia 04/26/2018  . Cough 04/22/2018  . Constipation 04/15/2018  . HTN (hypertension) 04/15/2018  . Iron deficiency anemia 04/08/2018  . Actinic keratosis 06/21/2017  . Moderate protein-calorie malnutrition ([place]) 03/06/2017  . Unsteady gait 03/06/2017  . Urinary incontinence 12/31/2016  . Oropharyngeal dysphagia 12/31/2016  . Dermatitis, seborrheic 12/14/2016  . Edema 12/14/2016  . Hyperlipidemia associated with type 2 diabetes mellitus ([place]) 12/06/2016  . Functional urinary incontinence 12/06/2016  . Chronic bilateral low back pain with bilateral sciatica 12/06/2016  . Chronic combined systolic and diastolic congestive heart failure ([place]) 12/06/2016  . Major depression, recurrent, chronic ([place]) 11/23/2016  . OAB (overactive bladder) 11/23/2016  . Dermatitis 08/23/2016  . External hemorrhoid 07/26/2016  . Depression with anxiety 07/09/2016  . Thrombocytopenia ([place]) 06/28/2016  . Urinary frequency 06/25/2016  . Closed left hip fracture, sequela 06/22/2016  . Type 2 diabetes mellitus with diabetic chronic kidney disease ([place]) 06/22/2016  . Hyperlipidemia LDL goal <70 06/22/2016  . Alzheimer disease ([place]) 11/12/2012  . History of bilateral hip replacements  11/12/2012    Orientation RESPIRATION BLADDER Height & Weight     Self  Normal Incontinent Weight: 178 lb 2.1 oz (80.8 kg) Height:  6\' 1"  (185.4 cm)  BEHAVIORAL SYMPTOMS/MOOD NEUROLOGICAL BOWEL NUTRITION STATUS      Incontinent Diet(regular)  AMBULATORY STATUS COMMUNICATION OF NEEDS Skin   Extensive Assist Verbally                         Personal Care Assistance Level of Assistance  Bathing, Feeding, Dressing Bathing Assistance: Maximum assistance Feeding assistance: Independent Dressing Assistance: Maximum assistance     Functional Limitations Info  Sight, Hearing, Speech Sight Info: Adequate Hearing Info: Adequate Speech Info: Adequate    SPECIAL CARE FACTORS FREQUENCY  PT (By licensed PT), OT (By licensed OT)     PT Frequency: 5x wk OT Frequency: 5x wk            Contractures Contractures Info: Not present    Additional Factors Info  Code Status, Allergies Code Status Info: Full Code Allergies Info: AXONA BACID, GABAPENTIN           Current Medications (04/30/2018):  This is the current hospital active medication list Current Facility-Administered Medications  Medication Dose Route Frequency Provider Last Rate Last Dose  . acetaminophen (TYLENOL) tablet 650 mg  650 mg Oral Q4H PRN Jacques Earthly T, MD      . albuterol (PROVENTIL) (2.5 MG/3ML) 0.083% nebulizer solution 2.5 mg  2.5 mg Nebulization Q2H PRN Erick Colace, NP   2.5 mg at 04/27/18 2337  . aspirin chewable tablet 81 mg  81 mg Oral Daily Erick Colace, NP   81 mg at 04/30/18 1014  .  buPROPion (WELLBUTRIN SR) 12 hr tablet 100 mg  100 mg Oral Daily Erick Colace, NP   100 mg at 04/30/18 1015  . carvedilol (COREG) tablet 3.125 mg  3.125 mg Oral BID WC Erick Colace, NP   3.125 mg at 04/30/18 0755  . cefdinir (OMNICEF) capsule 300 mg  300 mg Oral Q12H Roney Jaffe, MD      . cholecalciferol (VITAMIN D3) tablet 1,000 Units  1,000 Units Oral Daily Erick Colace, NP   1,000 Units  at 04/30/18 1014  . donepezil (ARICEPT) tablet 23 mg  23 mg Oral Daily Erick Colace, NP   23 mg at 04/30/18 1014  . dorzolamide-timolol (COSOPT) 22.3-6.8 MG/ML ophthalmic solution 1 drop  1 drop Left Eye BID Erick Colace, NP   1 drop at 04/30/18 1022  . ferrous sulfate tablet 325 mg  325 mg Oral Q breakfast Erick Colace, NP   325 mg at 04/30/18 0755  . folic acid (FOLVITE) tablet 0.5 mg  500 mcg Oral QPM Erick Colace, NP   0.5 mg at 04/28/18 1746  . furosemide (LASIX) tablet 20 mg  20 mg Oral Daily Erick Colace, NP   20 mg at 04/30/18 1014  . insulin aspart (novoLOG) injection 0-5 Units  0-5 Units Subcutaneous QHS Erick Colace, NP   2 Units at 04/29/18 2126  . insulin aspart (novoLOG) injection 0-9 Units  0-9 Units Subcutaneous TID WC Erick Colace, NP   2 Units at 04/30/18 1215  . ipratropium-albuterol (DUONEB) 0.5-2.5 (3) MG/3ML nebulizer solution 3 mL  3 mL Nebulization BID Purohit, Shrey C, MD   3 mL at 04/30/18 0810  . MEDLINE mouth rinse  15 mL Mouth Rinse BID Erick Colace, NP   15 mL at 04/30/18 1022  . memantine (NAMENDA XR) 24 hr capsule 28 mg  28 mg Oral Daily Erick Colace, NP   28 mg at 04/30/18 1014  . multivitamin with minerals tablet 1 tablet  1 tablet Oral Daily Erick Colace, NP   1 tablet at 04/30/18 1014  . simvastatin (ZOCOR) tablet 5 mg  5 mg Oral q1800 Erick Colace, NP   5 mg at 04/28/18 1747     Discharge Medications: Please see discharge summary for a list of discharge medications.  Relevant Imaging Results:  Relevant Lab Results:   Additional Information SS# [id number]  Wende Neighbors, LCSW

## 2018-05-01 ENCOUNTER — Emergency Department (HOSPITAL_COMMUNITY): Payer: Medicare Other

## 2018-05-01 ENCOUNTER — Inpatient Hospital Stay (HOSPITAL_COMMUNITY)
Admission: EM | Admit: 2018-05-01 | Discharge: 2018-05-05 | DRG: 177 | Disposition: A | Discharge status: Skilled Nursing Facility | Payer: Medicare Other | Attending: Internal Medicine | Admitting: Internal Medicine

## 2018-05-01 ENCOUNTER — Encounter (HOSPITAL_COMMUNITY): Payer: Self-pay | Admitting: *Deleted

## 2018-05-01 ENCOUNTER — Other Ambulatory Visit: Payer: Self-pay

## 2018-05-01 DIAGNOSIS — R1310 Dysphagia, unspecified: Secondary | ICD-10-CM | POA: Diagnosis present

## 2018-05-01 DIAGNOSIS — Z6825 Body mass index (BMI) 25.0-25.9, adult: Secondary | ICD-10-CM

## 2018-05-01 DIAGNOSIS — Z87891 Personal history of nicotine dependence: Secondary | ICD-10-CM

## 2018-05-01 DIAGNOSIS — F0280 Dementia in other diseases classified elsewhere without behavioral disturbance: Secondary | ICD-10-CM | POA: Diagnosis not present

## 2018-05-01 DIAGNOSIS — Z9841 Cataract extraction status, right eye: Secondary | ICD-10-CM | POA: Diagnosis not present

## 2018-05-01 DIAGNOSIS — Z9842 Cataract extraction status, left eye: Secondary | ICD-10-CM | POA: Diagnosis not present

## 2018-05-01 DIAGNOSIS — J690 Pneumonitis due to inhalation of food and vomit: Secondary | ICD-10-CM | POA: Diagnosis not present

## 2018-05-01 DIAGNOSIS — E1122 Type 2 diabetes mellitus with diabetic chronic kidney disease: Secondary | ICD-10-CM

## 2018-05-01 DIAGNOSIS — Z8249 Family history of ischemic heart disease and other diseases of the circulatory system: Secondary | ICD-10-CM | POA: Diagnosis not present

## 2018-05-01 DIAGNOSIS — J9601 Acute respiratory failure with hypoxia: Secondary | ICD-10-CM | POA: Diagnosis not present

## 2018-05-01 DIAGNOSIS — M2550 Pain in unspecified joint: Secondary | ICD-10-CM | POA: Diagnosis not present

## 2018-05-01 DIAGNOSIS — Z85828 Personal history of other malignant neoplasm of skin: Secondary | ICD-10-CM

## 2018-05-01 DIAGNOSIS — I5022 Chronic systolic (congestive) heart failure: Secondary | ICD-10-CM | POA: Diagnosis present

## 2018-05-01 DIAGNOSIS — R531 Weakness: Secondary | ICD-10-CM | POA: Diagnosis not present

## 2018-05-01 DIAGNOSIS — Z888 Allergy status to other drugs, medicaments and biological substances status: Secondary | ICD-10-CM | POA: Diagnosis not present

## 2018-05-01 DIAGNOSIS — E43 Unspecified severe protein-calorie malnutrition: Secondary | ICD-10-CM | POA: Diagnosis present

## 2018-05-01 DIAGNOSIS — I959 Hypotension, unspecified: Secondary | ICD-10-CM | POA: Diagnosis present

## 2018-05-01 DIAGNOSIS — I110 Hypertensive heart disease with heart failure: Secondary | ICD-10-CM | POA: Diagnosis not present

## 2018-05-01 DIAGNOSIS — Z515 Encounter for palliative care: Secondary | ICD-10-CM | POA: Diagnosis not present

## 2018-05-01 DIAGNOSIS — G309 Alzheimer's disease, unspecified: Secondary | ICD-10-CM | POA: Diagnosis present

## 2018-05-01 DIAGNOSIS — R1312 Dysphagia, oropharyngeal phase: Secondary | ICD-10-CM | POA: Diagnosis not present

## 2018-05-01 DIAGNOSIS — Z794 Long term (current) use of insulin: Secondary | ICD-10-CM

## 2018-05-01 DIAGNOSIS — Z96643 Presence of artificial hip joint, bilateral: Secondary | ICD-10-CM | POA: Diagnosis present

## 2018-05-01 DIAGNOSIS — Z7401 Bed confinement status: Secondary | ICD-10-CM | POA: Diagnosis not present

## 2018-05-01 DIAGNOSIS — Z79899 Other long term (current) drug therapy: Secondary | ICD-10-CM

## 2018-05-01 DIAGNOSIS — N183 Chronic kidney disease, stage 3 (moderate): Secondary | ICD-10-CM

## 2018-05-01 DIAGNOSIS — G92 Toxic encephalopathy: Secondary | ICD-10-CM | POA: Diagnosis not present

## 2018-05-01 DIAGNOSIS — Z7189 Other specified counseling: Secondary | ICD-10-CM | POA: Diagnosis not present

## 2018-05-01 DIAGNOSIS — I509 Heart failure, unspecified: Secondary | ICD-10-CM | POA: Diagnosis not present

## 2018-05-01 DIAGNOSIS — E119 Type 2 diabetes mellitus without complications: Secondary | ICD-10-CM | POA: Diagnosis not present

## 2018-05-01 DIAGNOSIS — F0281 Dementia in other diseases classified elsewhere with behavioral disturbance: Secondary | ICD-10-CM | POA: Diagnosis not present

## 2018-05-01 DIAGNOSIS — R0602 Shortness of breath: Secondary | ICD-10-CM | POA: Diagnosis not present

## 2018-05-01 DIAGNOSIS — K5790 Diverticulosis of intestine, part unspecified, without perforation or abscess without bleeding: Secondary | ICD-10-CM | POA: Diagnosis not present

## 2018-05-01 DIAGNOSIS — R0902 Hypoxemia: Secondary | ICD-10-CM | POA: Diagnosis not present

## 2018-05-01 LAB — POCT I-STAT 7, (LYTES, BLD GAS, ICA,H+H)
Acid-base deficit: 1 mmol/L (ref 0.0–2.0)
Bicarbonate: 22.4 mmol/L (ref 20.0–28.0)
Calcium, Ion: 1.18 mmol/L (ref 1.15–1.40)
HCT: 27 % — ABNORMAL LOW (ref 39.0–52.0)
Hemoglobin: 9.2 g/dL — ABNORMAL LOW (ref 13.0–17.0)
O2 Saturation: 99 %
PCO2 ART: 31.7 mmHg — AB (ref 32.0–48.0)
POTASSIUM: 3.8 mmol/L (ref 3.5–5.1)
Sodium: 143 mmol/L (ref 135–145)
TCO2: 23 mmol/L (ref 22–32)
pH, Arterial: 7.458 — ABNORMAL HIGH (ref 7.350–7.450)
pO2, Arterial: 118 mmHg — ABNORMAL HIGH (ref 83.0–108.0)

## 2018-05-01 LAB — BASIC METABOLIC PANEL
Anion gap: 9 (ref 5–15)
BUN: 15 mg/dL (ref 8–23)
CO2: 24 mmol/L (ref 22–32)
Calcium: 8 mg/dL — ABNORMAL LOW (ref 8.9–10.3)
Chloride: 111 mmol/L (ref 98–111)
Creatinine, Ser: 1.15 mg/dL (ref 0.61–1.24)
GFR calc Af Amer: >60 mL/min (ref >60)
GFR calc non Af Amer: 58 mL/min — ABNORMAL LOW (ref >60)
GLUCOSE: 163 mg/dL — AB (ref 70–99)
Potassium: 4.1 mmol/L (ref 3.5–5.1)
Sodium: 144 mmol/L (ref 135–145)

## 2018-05-01 LAB — CBC
HCT: 31.8 % — ABNORMAL LOW (ref 39.0–52.0)
HEMOGLOBIN: 9.4 g/dL — AB (ref 13.0–17.0)
MCH: 26.3 pg (ref 26.0–34.0)
MCHC: 29.6 g/dL — ABNORMAL LOW (ref 30.0–36.0)
MCV: 89.1 fL (ref 80.0–100.0)
Platelets: 134 10*3/uL — ABNORMAL LOW (ref 150–400)
RBC: 3.57 MIL/uL — ABNORMAL LOW (ref 4.22–5.81)
RDW: 17.6 % — ABNORMAL HIGH (ref 11.5–15.5)
WBC: 10.9 10*3/uL — ABNORMAL HIGH (ref 4.0–10.5)
nRBC: 0 % (ref 0.0–0.2)

## 2018-05-01 LAB — URINALYSIS, ROUTINE W REFLEX MICROSCOPIC
Bilirubin Urine: NEGATIVE
Glucose, UA: NEGATIVE mg/dL
Ketones, ur: NEGATIVE mg/dL
Leukocytes, UA: NEGATIVE
NITRITE: NEGATIVE
Protein, ur: NEGATIVE mg/dL
Specific Gravity, Urine: 1.011 (ref 1.005–1.030)
pH: 6 (ref 5.0–8.0)

## 2018-05-01 LAB — CBC WITH DIFFERENTIAL/PLATELET
Abs Immature Granulocytes: 0.08 10*3/uL — ABNORMAL HIGH (ref 0.00–0.07)
Basophils Absolute: 0 10*3/uL (ref 0.0–0.1)
Basophils Relative: 0 %
Eosinophils Absolute: 0.3 10*3/uL (ref 0.0–0.5)
Eosinophils Relative: 2 %
HCT: 32.9 % — ABNORMAL LOW (ref 39.0–52.0)
Hemoglobin: 9.4 g/dL — ABNORMAL LOW (ref 13.0–17.0)
Immature Granulocytes: 1 %
Lymphocytes Relative: 9 %
Lymphs Abs: 1.2 10*3/uL (ref 0.7–4.0)
MCH: 25.7 pg — ABNORMAL LOW (ref 26.0–34.0)
MCHC: 28.6 g/dL — ABNORMAL LOW (ref 30.0–36.0)
MCV: 89.9 fL (ref 80.0–100.0)
Monocytes Absolute: 0.7 10*3/uL (ref 0.1–1.0)
Monocytes Relative: 5 %
NEUTROS PCT: 83 %
Neutro Abs: 10.9 10*3/uL — ABNORMAL HIGH (ref 1.7–7.7)
Platelets: 185 10*3/uL (ref 150–400)
RBC: 3.66 MIL/uL — ABNORMAL LOW (ref 4.22–5.81)
RDW: 17.9 % — ABNORMAL HIGH (ref 11.5–15.5)
WBC: 13.1 10*3/uL — ABNORMAL HIGH (ref 4.0–10.5)
nRBC: 0 % (ref 0.0–0.2)

## 2018-05-01 LAB — COMPREHENSIVE METABOLIC PANEL
ALT: 60 U/L — ABNORMAL HIGH (ref 0–44)
AST: 47 U/L — ABNORMAL HIGH (ref 15–41)
Albumin: 2.6 g/dL — ABNORMAL LOW (ref 3.5–5.0)
Alkaline Phosphatase: 111 U/L (ref 38–126)
Anion gap: 9 (ref 5–15)
BUN: 15 mg/dL (ref 8–23)
CHLORIDE: 111 mmol/L (ref 98–111)
CO2: 21 mmol/L — AB (ref 22–32)
Calcium: 8.2 mg/dL — ABNORMAL LOW (ref 8.9–10.3)
Creatinine, Ser: 1.15 mg/dL (ref 0.61–1.24)
GFR calc Af Amer: >60 mL/min (ref >60)
GFR calc non Af Amer: 58 mL/min — ABNORMAL LOW (ref >60)
Glucose, Bld: 224 mg/dL — ABNORMAL HIGH (ref 70–99)
Potassium: 4 mmol/L (ref 3.5–5.1)
Sodium: 141 mmol/L (ref 135–145)
Total Bilirubin: 0.6 mg/dL (ref 0.3–1.2)
Total Protein: 6.2 g/dL — ABNORMAL LOW (ref 6.5–8.1)

## 2018-05-01 LAB — BLOOD GAS, ARTERIAL
Acid-base deficit: 0.1 mmol/L (ref 0.0–2.0)
Bicarbonate: 23 mmol/L (ref 20.0–28.0)
Delivery systems: POSITIVE
Drawn by: 274071
Expiratory PAP: 8
FIO2: 50
Inspiratory PAP: 16
O2 Saturation: 99.9 %
PH ART: 7.488 — AB (ref 7.350–7.450)
Patient temperature: 98.6
pCO2 arterial: 30.6 mmHg — ABNORMAL LOW (ref 32.0–48.0)
pO2, Arterial: 186 mmHg — ABNORMAL HIGH (ref 83.0–108.0)

## 2018-05-01 LAB — GLUCOSE, CAPILLARY
Glucose-Capillary: 131 mg/dL — ABNORMAL HIGH (ref 70–99)
Glucose-Capillary: 106 mg/dL — ABNORMAL HIGH (ref 70–99)
Glucose-Capillary: 120 mg/dL — ABNORMAL HIGH (ref 70–99)
Glucose-Capillary: 112 mg/dL — ABNORMAL HIGH (ref 70–99)

## 2018-05-01 LAB — I-STAT TROPONIN, ED: Troponin i, poc: 0.01 ng/mL (ref 0.00–0.08)

## 2018-05-01 LAB — CULTURE, BLOOD (ROUTINE X 2)
Culture: NO GROWTH
Special Requests: ADEQUATE

## 2018-05-01 LAB — LACTIC ACID, PLASMA: Lactic Acid, Venous: 1.4 mmol/L (ref 0.5–1.9)

## 2018-05-01 LAB — INFLUENZA PANEL BY PCR (TYPE A & B)
Influenza A By PCR: NEGATIVE
Influenza B By PCR: NEGATIVE

## 2018-05-01 LAB — BRAIN NATRIURETIC PEPTIDE: B Natriuretic Peptide: 1122.5 pg/mL — ABNORMAL HIGH (ref 0.0–100.0)

## 2018-05-01 MED ORDER — LEVOFLOXACIN IN D5W 750 MG/150ML IV SOLN
750.0000 mg | Freq: Once | INTRAVENOUS | Status: AC
  Administered 2018-05-01: 750 mg via INTRAVENOUS
  Filled 2018-05-01: qty 150

## 2018-05-01 MED ORDER — ALBUTEROL SULFATE (2.5 MG/3ML) 0.083% IN NEBU: 2.5000 mg | INHALATION_SOLUTION | RESPIRATORY_TRACT | Status: DC | PRN

## 2018-05-01 MED ORDER — IPRATROPIUM-ALBUTEROL 0.5-2.5 (3) MG/3ML IN SOLN
3.0000 mL | Freq: Three times a day (TID) | RESPIRATORY_TRACT | Status: DC
  Administered 2018-05-02 (×2): 3 mL via RESPIRATORY_TRACT
  Filled 2018-05-01 (×2): qty 3

## 2018-05-01 MED ORDER — SODIUM CHLORIDE 0.9 % IV BOLUS
500.0000 mL | Freq: Once | INTRAVENOUS | Status: AC
  Administered 2018-05-01: 500 mL via INTRAVENOUS

## 2018-05-01 MED ORDER — IPRATROPIUM BROMIDE 0.02 % IN SOLN
0.5000 mg | Freq: Four times a day (QID) | RESPIRATORY_TRACT | Status: DC
  Administered 2018-05-01: 0.5 mg via RESPIRATORY_TRACT
  Filled 2018-05-01: qty 2.5

## 2018-05-01 MED ORDER — SODIUM CHLORIDE 0.9% FLUSH
3.0000 mL | Freq: Two times a day (BID) | INTRAVENOUS | Status: DC
  Administered 2018-05-01 (×2): 3 mL via INTRAVENOUS

## 2018-05-01 MED ORDER — IPRATROPIUM-ALBUTEROL 0.5-2.5 (3) MG/3ML IN SOLN
3.0000 mL | Freq: Four times a day (QID) | RESPIRATORY_TRACT | Status: DC
  Administered 2018-05-01 (×3): 3 mL via RESPIRATORY_TRACT
  Filled 2018-05-01: qty 39
  Filled 2018-05-01 (×2): qty 3

## 2018-05-01 MED ORDER — ONDANSETRON HCL 4 MG/2ML IJ SOLN: 4.0000 mg | Freq: Four times a day (QID) | INTRAMUSCULAR | Status: DC | PRN

## 2018-05-01 MED ORDER — SODIUM CHLORIDE 0.9% FLUSH: 3.0000 mL | INTRAVENOUS | Status: DC | PRN

## 2018-05-01 MED ORDER — SODIUM CHLORIDE 0.9 % IV BOLUS: 500.0000 mL | Freq: Once | INTRAVENOUS | Status: DC

## 2018-05-01 MED ORDER — LEVOFLOXACIN IN D5W 750 MG/150ML IV SOLN: 750.0000 mg | INTRAVENOUS | Status: DC

## 2018-05-01 MED ORDER — ENOXAPARIN SODIUM 40 MG/0.4ML ~~LOC~~ SOLN
40.0000 mg | SUBCUTANEOUS | Status: DC
  Administered 2018-05-01 – 2018-05-04 (×4): 40 mg via SUBCUTANEOUS
  Filled 2018-05-01 (×4): qty 0.4

## 2018-05-01 MED ORDER — SODIUM CHLORIDE 0.9 % IV SOLN: 250.0000 mL | INTRAVENOUS | Status: DC | PRN

## 2018-05-01 MED ORDER — SODIUM CHLORIDE 0.9 % IV SOLN
1.5000 g | Freq: Four times a day (QID) | INTRAVENOUS | Status: DC
  Administered 2018-05-01 – 2018-05-04 (×12): 1.5 g via INTRAVENOUS
  Filled 2018-05-01 (×14): qty 1.5

## 2018-05-01 MED ORDER — ONDANSETRON HCL 4 MG PO TABS: 4.0000 mg | ORAL_TABLET | Freq: Four times a day (QID) | ORAL | Status: DC | PRN

## 2018-05-01 MED ORDER — INSULIN ASPART 100 UNIT/ML ~~LOC~~ SOLN
0.0000 [IU] | SUBCUTANEOUS | Status: DC
  Administered 2018-05-01: 1 [IU] via SUBCUTANEOUS
  Administered 2018-05-02 – 2018-05-03 (×3): 2 [IU] via SUBCUTANEOUS
  Administered 2018-05-03: 1 [IU] via SUBCUTANEOUS

## 2018-05-01 MED ORDER — ALBUTEROL SULFATE (2.5 MG/3ML) 0.083% IN NEBU
2.5000 mg | INHALATION_SOLUTION | Freq: Four times a day (QID) | RESPIRATORY_TRACT | Status: DC
  Administered 2018-05-01: 2.5 mg via RESPIRATORY_TRACT
  Filled 2018-05-01: qty 3

## 2018-05-01 MED ORDER — FUROSEMIDE 10 MG/ML IJ SOLN
40.0000 mg | Freq: Once | INTRAMUSCULAR | Status: AC
  Administered 2018-05-01: 40 mg via INTRAVENOUS
  Filled 2018-05-01: qty 4

## 2018-05-01 NOTE — Progress Notes (Signed)
Pharmacy Antibiotic Note  Jose [name] is a 85 y.o. male admitted on 05/01/2018 with pneumonia.  Pharmacy has been consulted for Levaquin dosing.  Plan: Levaquin 750mg  IV Q24H.  Weight: 194 lb 0.1 oz (88 kg)  Temp (24hrs), Avg:98.3 F (36.8 C), Min:97.8 F (36.6 C), Max:98.7 F (37.1 C)  Recent Labs  Lab 04/25/18 1855 04/25/18 2130 04/26/18 0316 04/27/18 0009 04/27/18 0321 04/27/18 1122 04/29/18 0414 04/30/18 0534 05/01/18 0034  WBC  --   --  7.5  --  5.4 5.9  --  8.8 13.1*  CREATININE  --   --  1.32*  --  1.11 1.15 1.09 0.95 1.15  LATICACIDVEN 2.3* 4.6*  --  1.0 1.2  --   --   --  1.4    Estimated Creatinine Clearance: 53.1 mL/min (by C-G formula based on SCr of 1.15 mg/dL).    Allergies  Allergen Reactions  . Axona [Bacid]     Unknown per MAR  . Gabapentin     Hallucinations     Thank you for allowing pharmacy to be a part of this patient's care.  Wynona Neat, PharmD, BCPS  05/01/2018 2:52 AM

## 2018-05-01 NOTE — ED Notes (Signed)
Report called to rn on 4e 

## 2018-05-01 NOTE — ED Notes (Signed)
Pt reports py was an imn-patient at [facility]  Until 1800 last pm  Not in the ed

## 2018-05-01 NOTE — ED Notes (Signed)
Pt wet the bed linens changed  Condom cath placed pt alert family at bedside

## 2018-05-01 NOTE — Progress Notes (Signed)
RT transported patient from ED to 4E without any complications 

## 2018-05-01 NOTE — Progress Notes (Signed)
[facility] PROGRESS NOTE    Progress Note  Jose [name]  AYT:[id number] DOB: Oct 18, 1930 DOA: 05/01/2018 PCP: [name], MD     Brief Narrative:   Jose [name] is an 85 y.o. male past medical history significant for dysphasia, colic heart failure with an EF of 35% on April 26, 2018 with very mild aortic stenosis during this echo, on admission had aspiration pneumonia from [facility], primary relates that when he went home he ate and started coughing and choking.  He became very short of breath EMS was called saturations were low he was placed on BiPAP.  Assessment/Plan:   Acute respiratory failure with hypoxia (HCC) in the setting of aspiration pneumonia: He was placed on BiPAP started empirically on IV Levaquin.  Will change to IV Unasyn She is change from previous. Satting 90% on BiPAP non-tachycardic blood pressure stable. Family has no insight on worsening condition, they just relate that if we can fix his diet he could probably go back home. Spoke with the family that this is a possibility but his course is irreversible and we have to sit down to speak about how to proceed moving forward. We will consult hospice and palliative care. He was evaluated by speech that showed oropharyngeal dysphagia with laryngeal penetration and aspiration with significant residual they recommended a dysphagia 1 diet no straws.  Dysphagia: Keep him n.p.o. we will have to discuss with family about end-of-life. On speech evaluation 2 days ago they recommended regular solids with thin liquids.  Chronic systolic heart failure: Medications were held due to hypotensive episode in the ED. He was given IV Lasix and nitroglycerin, these were held he was given a bolus of normal saline. Continue aspirin.  Alzheimer disease ([place]) Continue current meds.  Essential hypertension: Continue to hold antihypertensive medication blood pressure is soft. Hold Coreg, lisinopril and  Lasix.  Insulin-dependent diabetes mellitus type 2: We will start him on lighting scale insulin he is currently n.p.o. Continue long-acting insulin and oral hypoglycemic agents.  Acute encephalopathy: Likely due to hypoxic and aspiration episode as per caregiver now improved.  Dementia without behavioral disturbances: We will resume probiotic memantine and benazepril.  Severe protein caloric malnutrition  DVT prophylaxis: lovenox Family Communication:daughter Disposition Plan/Barrier to D/C: unable to determine Code Status:     Code Status Orders  (From admission, onward)         Start     Ordered   05/01/18 0246  Full code  Continuous     05/01/18 0248        Code Status History    Date Active Date Inactive Code Status Order ID Comments User Context   04/26/2018 0008 04/30/2018 2213 Full Code [id number]  Milagros Loll, MD ED   03/29/2017 1426 04/01/2017 1658 Full Code [id number]  Rondel Jumbo, PA-C ED   11/28/2016 2051 11/30/2016 1812 Full Code [id number]  Charlynne Cousins, MD ED   06/22/2016 1523 06/23/2016 2135 Full Code [id number]  Caren Griffins, MD Inpatient        IV Access:    Peripheral IV   Procedures and diagnostic studies:   Dg Chest Portable 1 View  Result Date: 05/01/2018 CLINICAL DATA:  Shortness of breath. Recent left lower lobe pneumonia. EXAM: PORTABLE CHEST 1 VIEW COMPARISON:  Radiographs 3 days ago 04/28/2018, additional priors. FINDINGS: No significant change in left lung base opacity from prior exam. Unchanged right perihilar atelectasis. Unchanged heart size and mediastinal contours. Unchanged low lung volumes.  Mild basilar septal thickening and peribronchial thickening suspicious for mild pulmonary edema. No pneumothorax. IMPRESSION: 1. No significant change in left lung base opacity from prior exam. Unchanged right perihilar atelectasis. 2. Mild basilar septal and peribronchial thickening, suspicious for pulmonary edema. Electronically  Signed   By: Keith Rake M.D.   On: 05/01/2018 00:52   Dg Swallowing Func-speech Pathology  Result Date: 04/29/2018 Objective Swallowing Evaluation: Type of Study: MBS-Modified Barium Swallow Study  Patient Details Name: Jose [name] MRN: [id number] Date of Birth: [birth year] Today's Date: 04/29/2018 Time: SLP Start Time (ACUTE ONLY): 1210 -SLP Stop Time (ACUTE ONLY): 1230 SLP Time Calculation (min) (ACUTE ONLY): 20 min Past Medical History: Past Medical History: Diagnosis Date . Abnormality of gait  . Backache, unspecified  . CHF (congestive heart failure) ([place])  . Colon polyps   adenomatous . Dementia ([place]) 11/12/2012 . Depression  . Diabetes mellitus without complication ([place])  . Diverticulosis  . Essential and other specified forms of tremor  . Hemorrhoids  . History of bilateral hip replacements 11/12/2012 . Memory loss  . Other persistent mental disorders due to conditions classified elsewhere  . Pain in joint, pelvic region and thigh  . Skin cancer of scalp  . Spinal stenosis, lumbar region, without neurogenic claudication  . Unspecified hereditary and idiopathic peripheral neuropathy  Past Surgical History: Past Surgical History: Procedure Laterality Date . CATARACT EXTRACTION, BILATERAL  2012 . JOINT REPLACEMENT   . RETINAL LASER PROCEDURE  2012  retinal wrinkle . SKIN CANCER EXCISION   . TOTAL HIP ARTHROPLASTY Left 2000 . TOTAL HIP ARTHROPLASTY (aka REPLACEMENT) Right 7111 HPI: 85 year old man with history of CHF, dementia, DM, HTN, iron deficiency anemia presenting with altered mental status. Unable to obtain history from patient due to acute encephalopathy and underlying dementia. Marland Kitchen He has been having respiratory infection symptoms for the past 1.5 to 2 weeks, including cough that sounds productive. Patient had MBS 03/2017 with mild oropharyngeal dysphagia and discharged with Dys 1, thin liquid diet.  Subjective: alert, cooperative Assessment / Plan / Recommendation CHL IP CLINICAL IMPRESSIONS  04/29/2018 Clinical Impression Patient has mild oropharyngeal dysphagia. Thin liquids with cup sip were aspirated silently when bolus presented by cup, by examiner (trace). However no penetration or aspiration were noted when patient administered bolus himself. He had a natural chin tuck, that he initiated after the swallow that did not impact the swallow negatively or positively. All swallows were delayed, initiated at the valleculae with all consistencies and volumes. When patient was administered bolus by examiner, a larger bolus was given and the swallow was initiated at the pyriform sinuses and that is also when the silent aspiration occured. Patient has mild to moderated residue after all bolus types and sizes. Recommend a Regular diet, thin liquids with small bolus sizes. Self administered if possible, followed by a second swallow to attempt to clear residue. Medication whole in puree. Assistence of staff with meals to ensrue second swallow and bolus size/portion. SLP Visit Diagnosis Dysphagia, oropharyngeal phase (R13.12) Attention and concentration deficit following -- Frontal lobe and executive function deficit following -- Impact on safety and function Mild aspiration risk   CHL IP TREATMENT RECOMMENDATION 04/29/2018 Treatment Recommendations Therapy as outlined in treatment plan below   Prognosis 04/29/2018 Prognosis for Safe Diet Advancement Good Barriers to Reach Goals Cognitive deficits Barriers/Prognosis Comment -- CHL IP DIET RECOMMENDATION 04/29/2018 SLP Diet Recommendations Regular solids;Thin liquid Liquid Administration via Cup;No straw Medication Administration Whole meds with puree Compensations Minimize environmental distractions;Small sips/bites;Slow  rate;Multiple dry swallows after each bite/sip Postural Changes Remain semi-upright after after feeds/meals (Comment);Seated upright at 90 degrees   CHL IP OTHER RECOMMENDATIONS 04/29/2018 Recommended Consults -- Oral Care Recommendations Oral care  BID Other Recommendations --   CHL IP FOLLOW UP RECOMMENDATIONS 03/31/2017 Follow up Recommendations Skilled Nursing facility   [facility] IP FREQUENCY AND DURATION 04/29/2018 Speech Therapy Frequency (ACUTE ONLY) min 2x/week Treatment Duration 2 weeks      CHL IP ORAL PHASE 04/29/2018 Oral Phase WFL Oral - Pudding Teaspoon -- Oral - Pudding Cup -- Oral - Honey Teaspoon -- Oral - Honey Cup -- Oral - Nectar Teaspoon -- Oral - Nectar Cup -- Oral - Nectar Straw -- Oral - Thin Teaspoon -- Oral - Thin Cup -- Oral - Thin Straw -- Oral - Puree -- Oral - Mech Soft -- Oral - Regular -- Oral - Multi-Consistency -- Oral - Pill -- Oral Phase - Comment --  CHL IP PHARYNGEAL PHASE 04/29/2018 Pharyngeal Phase Impaired Pharyngeal- Pudding Teaspoon Delayed swallow initiation-vallecula;Pharyngeal residue - valleculae Pharyngeal -- Pharyngeal- Pudding Cup -- Pharyngeal -- Pharyngeal- Honey Teaspoon -- Pharyngeal -- Pharyngeal- Honey Cup -- Pharyngeal -- Pharyngeal- Nectar Teaspoon -- Pharyngeal -- Pharyngeal- Nectar Cup Delayed swallow initiation-vallecula;Pharyngeal residue - valleculae Pharyngeal -- Pharyngeal- Nectar Straw -- Pharyngeal -- Pharyngeal- Thin Teaspoon -- Pharyngeal -- Pharyngeal- Thin Cup Delayed swallow initiation-pyriform sinuses;Reduced airway/laryngeal closure;Reduced anterior laryngeal mobility;Penetration/Aspiration during swallow;Trace aspiration;Pharyngeal residue - valleculae Pharyngeal Material enters airway, passes BELOW cords without attempt by patient to eject out (silent aspiration) Pharyngeal- Thin Straw -- Pharyngeal -- Pharyngeal- Puree -- Pharyngeal -- Pharyngeal- Mechanical Soft Delayed swallow initiation-vallecula;Reduced laryngeal elevation;Pharyngeal residue - valleculae Pharyngeal -- Pharyngeal- Regular -- Pharyngeal -- Pharyngeal- Multi-consistency -- Pharyngeal -- Pharyngeal- Pill -- Pharyngeal -- Pharyngeal Comment --  CHL IP CERVICAL ESOPHAGEAL PHASE 04/29/2018 Cervical Esophageal Phase WFL Pudding  Teaspoon -- Pudding Cup -- Honey Teaspoon -- Honey Cup -- Nectar Teaspoon -- Nectar Cup -- Nectar Straw -- Thin Teaspoon -- Thin Cup -- Thin Straw -- Puree -- Mechanical Soft -- Regular -- Multi-consistency -- Pill -- Cervical Esophageal Comment -- Charlynne Cousins Ward 04/29/2018, 1:00 PM                Medical Consultants:    None.  Anti-Infectives:   IV Unasyn  Subjective:    Jose [name] no complaints, as per caregiver he is back to baseline.  Objective:    Vitals:   05/01/18 0320 05/01/18 0321 05/01/18 0345 05/01/18 0419  BP:   115/73   Pulse:   86 89  Resp:   (!) 23 (!) 29  Temp:      SpO2: 100% 100% 100% 100%  Weight:       No intake or output data in the 24 hours ending 05/01/18 0746 Filed Weights   05/01/18 0024  Weight: 88 kg    Exam: General exam: In no acute distress cachectic Respiratory system: Air movement with crackles on the right rhonchi's on the left Cardiovascular system: S1 & S2 heard, RRR. Gastrointestinal system: Abdomen is nondistended, soft and nontender.  Central nervous system: Moving all 4 extremities without any difficulties. Extremities: No pedal edema. Skin: No rashes, lesions or ulcers    Data Reviewed:    Labs: Basic Metabolic Panel: Recent Labs  Lab 04/26/18 0316  04/27/18 1122 04/29/18 0414 04/30/18 0534 05/01/18 0033 05/01/18 0034 05/01/18 0607  NA 148*   < > 148* 144 145 143 141 144  K 3.7   < >  3.6 3.5 3.6 3.8 4.0 4.1  CL 115*   < > 116* 114* 118*  --  111 111  CO2 26   < > 25 23 23   --  21* 24  GLUCOSE 168*   < > 237* 142* 133*  --  224* 163*  BUN 34*   < > 23 24* 16  --  15 15  CREATININE 1.32*   < > 1.15 1.09 0.95  --  1.15 1.15  CALCIUM 8.2*   < > 8.4* 8.1* 8.3*  --  8.2* 8.0*  MG 2.2  --   --   --   --   --   --   --   PHOS 3.3  --   --   --   --   --   --   --    < > = values in this interval not displayed.   GFR Estimated Creatinine Clearance: 53.1 mL/min (by C-G formula based on SCr of 1.15 mg/dL). Liver  Function Tests: Recent Labs  Lab 04/25/18 1853 04/27/18 0321 05/01/18 0034  AST 19 44* 47*  ALT 16 40 60*  ALKPHOS 75 91 111  BILITOT 0.4 0.5 0.6  PROT 6.3* 5.5* 6.2*  ALBUMIN 3.0* 2.5* 2.6*   No results for input(s): LIPASE, AMYLASE in the last 168 hours. No results for input(s): AMMONIA in the last 168 hours. Coagulation profile No results for input(s): INR, PROTIME in the last 168 hours.  CBC: Recent Labs  Lab 04/25/18 1853  04/27/18 0321 04/27/18 1122 04/30/18 0534 05/01/18 0033 05/01/18 0034 05/01/18 0607  WBC 9.2   < > 5.4 5.9 8.8  --  13.1* 10.9*  NEUTROABS 7.6  --  3.7 4.5  --   --  10.9*  --   HGB 8.7*   < > 8.1* 8.2* 8.2* 9.2* 9.4* 9.4*  HCT 30.2*   < > 28.5* 29.1* 28.8* 27.0* 32.9* 31.8*  MCV 89.9   < > 92.8 94.8 91.1  --  89.9 89.1  PLT 137*   < > 128* 121* 143*  --  185 134*   < > = values in this interval not displayed.   Cardiac Enzymes: Recent Labs  Lab 04/26/18 0003 04/26/18 0316 04/26/18 1203  TROPONINI 0.04* 0.07* 0.06*   BNP (last 3 results) No results for input(s): PROBNP in the last 8760 hours. CBG: Recent Labs  Lab 04/29/18 1618 04/29/18 2116 04/30/18 0720 04/30/18 1141 04/30/18 1659  GLUCAP 202* 205* 122* 179* 161*   D-Dimer: No results for input(s): DDIMER in the last 72 hours. Hgb A1c: No results for input(s): HGBA1C in the last 72 hours. Lipid Profile: No results for input(s): CHOL, HDL, LDLCALC, TRIG, CHOLHDL, LDLDIRECT in the last 72 hours. Thyroid function studies: No results for input(s): TSH, T4TOTAL, T3FREE, THYROIDAB in the last 72 hours.  Invalid input(s): FREET3 Anemia work up: No results for input(s): VITAMINB12, FOLATE, FERRITIN, TIBC, IRON, RETICCTPCT in the last 72 hours. Sepsis Labs: Recent Labs  Lab 04/25/18 2130  04/26/18 1055 04/27/18 0009 04/27/18 0321 04/27/18 1122 04/28/18 0314 04/30/18 0534 05/01/18 0034 05/01/18 0607  PROCALCITON  --   --  0.51  --  0.45  --  0.28  --   --   --   WBC  --     < >  --   --  5.4 5.9  --  8.8 13.1* 10.9*  LATICACIDVEN 4.6*  --   --  1.0 1.2  --   --   --  1.4  --    < > = values in this interval not displayed.   Microbiology Recent Results (from the past 240 hour(s))  Culture, blood (routine x 2)     Status: Abnormal   Collection Time: 04/25/18  6:54 PM  Result Value Ref Range Status   Specimen Description   Final    BLOOD LEFT FOREARM Performed at [facility], [address], [place], [place] [address]    Special Requests   Final    BOTTLES DRAWN AEROBIC AND ANAEROBIC Blood Culture results may not be optimal due to an excessive volume of blood received in culture bottles Performed at [facility] [address], [place], [place] [address]    Culture  Setup Time   Final    GRAM POSITIVE COCCI ANAEROBIC BOTTLE ONLY CRITICAL RESULT CALLED TO, READ BACK BY AND VERIFIED WITH: B.GREEN,PHARMD AT 0046 ON 04/27/18 BY G.MCADOO    Culture (A)  Final    STAPHYLOCOCCUS SPECIES (COAGULASE NEGATIVE) THE SIGNIFICANCE OF ISOLATING THIS ORGANISM FROM A SINGLE SET OF BLOOD CULTURES WHEN MULTIPLE SETS ARE DRAWN IS UNCERTAIN. PLEASE NOTIFY THE MICROBIOLOGY DEPARTMENT WITHIN ONE WEEK IF SPECIATION AND SENSITIVITIES ARE REQUIRED. Performed at [facility], [place] [address], [place], [place] [address]    Report Status 04/28/2018 FINAL  Final  Blood Culture ID Panel (Reflexed)     Status: Abnormal   Collection Time: 04/25/18  6:54 PM  Result Value Ref Range Status   Enterococcus species NOT DETECTED NOT DETECTED Final   Listeria monocytogenes NOT DETECTED NOT DETECTED Final   Staphylococcus species DETECTED (A) NOT DETECTED Final    Comment: Methicillin (oxacillin) susceptible coagulase negative staphylococcus. Possible blood culture contaminant (unless isolated from more than one blood culture draw or clinical case suggests pathogenicity). No antibiotic treatment is indicated for blood  culture contaminants. CRITICAL RESULT  CALLED TO, READ BACK BY AND VERIFIED WITH: B.GREEN,PHARMD AT 0046 ON 04/27/18 BY G.MCADOO    Staphylococcus aureus (BCID) NOT DETECTED NOT DETECTED Final   Methicillin resistance NOT DETECTED NOT DETECTED Final   Streptococcus species NOT DETECTED NOT DETECTED Final   Streptococcus agalactiae NOT DETECTED NOT DETECTED Corrected    Comment: CORRECTED ON 01/26 AT 0102: PREVIOUSLY REPORTED AS NOT DETECTED CRITICAL RESULT CALLED TO, READ BACK BY AND VERIFIED WITH: B.GREEN,PHARMD AT 0046 ON 04/27/18 BY G.MCADOO   Streptococcus pneumoniae NOT DETECTED NOT DETECTED Final   Streptococcus pyogenes NOT DETECTED NOT DETECTED Final   Acinetobacter baumannii NOT DETECTED NOT DETECTED Final   Enterobacteriaceae species NOT DETECTED NOT DETECTED Final   Enterobacter cloacae complex NOT DETECTED NOT DETECTED Final   Escherichia coli NOT DETECTED NOT DETECTED Final   Klebsiella oxytoca NOT DETECTED NOT DETECTED Final   Klebsiella pneumoniae NOT DETECTED NOT DETECTED Final   Proteus species NOT DETECTED NOT DETECTED Final   Serratia marcescens NOT DETECTED NOT DETECTED Final   Haemophilus influenzae NOT DETECTED NOT DETECTED Final   Neisseria meningitidis NOT DETECTED NOT DETECTED Final   Pseudomonas aeruginosa NOT DETECTED NOT DETECTED Final   Candida albicans NOT DETECTED NOT DETECTED Final   Candida glabrata NOT DETECTED NOT DETECTED Final   Candida krusei NOT DETECTED NOT DETECTED Final   Candida parapsilosis NOT DETECTED NOT DETECTED Final   Candida tropicalis NOT DETECTED NOT DETECTED Final    Comment: Performed at [facility], [place] [address] [place], [place] [address]  Culture, blood (routine x 2)     Status: None (  Preliminary result)   Collection Time: 04/25/18  7:18 PM  Result Value Ref Range Status   Specimen Description   Final    BLOOD RIGHT HAND Performed at [facility], [place] [address] [place], [place] [address]    Special Requests   Final    BOTTLES DRAWN AEROBIC AND  ANAEROBIC Blood Culture adequate volume Performed at [facility] [address], [place], [place] [address]    Culture   Final    NO GROWTH 4 DAYS Performed at [facility], [place] [address] [place], [place] [address]    Report Status PENDING  Incomplete  Urine culture     Status: Abnormal   Collection Time: 04/25/18  8:52 PM  Result Value Ref Range Status   Specimen Description URINE, RANDOM  Final   Special Requests   Final    NONE Performed at [facility] [address], [place], [place] [address]    Culture >=100,000 COLONIES/mL ESCHERICHIA COLI (A)  Final   Report Status 04/28/2018 FINAL  Final   Organism ID, Bacteria ESCHERICHIA COLI (A)  Final      Susceptibility   Escherichia coli - MIC*    AMPICILLIN <=2 SENSITIVE Sensitive     CEFAZOLIN <=4 SENSITIVE Sensitive     CEFTRIAXONE <=1 SENSITIVE Sensitive     CIPROFLOXACIN <=0.25 SENSITIVE Sensitive     GENTAMICIN <=1 SENSITIVE Sensitive     IMIPENEM <=0.25 SENSITIVE Sensitive     NITROFURANTOIN <=16 SENSITIVE Sensitive     TRIMETH/SULFA <=20 SENSITIVE Sensitive     AMPICILLIN/SULBACTAM <=2 SENSITIVE Sensitive     PIP/TAZO <=4 SENSITIVE Sensitive     Extended ESBL NEGATIVE Sensitive     * >=100,000 COLONIES/mL ESCHERICHIA COLI  Respiratory Panel by PCR     Status: Abnormal   Collection Time: 04/26/18 12:28 AM  Result Value Ref Range Status   Adenovirus NOT DETECTED NOT DETECTED Final   Coronavirus 229E NOT DETECTED NOT DETECTED Final   Coronavirus HKU1 NOT DETECTED NOT DETECTED Final   Coronavirus NL63 NOT DETECTED NOT DETECTED Final   Coronavirus OC43 NOT DETECTED NOT DETECTED Final   Metapneumovirus NOT DETECTED NOT DETECTED Final   Rhinovirus / Enterovirus NOT DETECTED NOT DETECTED Final   Influenza A NOT DETECTED NOT DETECTED Final   Influenza B NOT DETECTED NOT DETECTED Final   Parainfluenza Virus 1 NOT DETECTED NOT DETECTED Final   Parainfluenza Virus 2 NOT  DETECTED NOT DETECTED Final   Parainfluenza Virus 3 NOT DETECTED NOT DETECTED Final   Parainfluenza Virus 4 NOT DETECTED NOT DETECTED Final   Respiratory Syncytial Virus DETECTED (A) NOT DETECTED Final    Comment: CRITICAL RESULT CALLED TO, READ BACK BY AND VERIFIED WITH: RN B MAY [id number] [phone]    Bordetella pertussis NOT DETECTED NOT DETECTED Final   Chlamydophila pneumoniae NOT DETECTED NOT DETECTED Final   Mycoplasma pneumoniae NOT DETECTED NOT DETECTED Final    Comment: Performed at [facility], [place] [address], [place], [place] [address]  MRSA PCR Screening     Status: None   Collection Time: 04/26/18  2:05 AM  Result Value Ref Range Status   MRSA by PCR NEGATIVE NEGATIVE Final    Comment:        The GeneXpert MRSA Assay (FDA approved for NASAL specimens only), is one component of a comprehensive MRSA colonization surveillance program. It is not intended to diagnose MRSA infection nor to guide or monitor treatment  for MRSA infections. Performed at [facility], [place] [address] [place], [place] [address]      Medications:   . enoxaparin (LOVENOX) injection  40 mg Subcutaneous Q24H  . ipratropium-albuterol  3 mL Nebulization Q6H  . sodium chloride flush  3 mL Intravenous Q12H   Continuous Infusions: . sodium chloride    . [START ON 05/02/2018] levofloxacin (LEVAQUIN) IV       LOS: 0 days   Charlynne Cousins  [facility]  05/01/2018, 7:46 AM

## 2018-05-01 NOTE — Progress Notes (Signed)
This note also relates to the following rows which could not be included: Pulse Rate - Cannot attach notes to unvalidated device data Resp - Cannot attach notes to unvalidated device data SpO2 - Cannot attach notes to unvalidated device data  Took pt off bipap and placed on 3L.  Vital signs stable.

## 2018-05-01 NOTE — ED Triage Notes (Signed)
The pt arrived by [facility]  The [facility] [place].  He just left  ed 30 minutes prior.  At present placed on bi-pap after coming in on c-pap alert answering yes and no questions

## 2018-05-01 NOTE — H&P (Signed)
History and Physical    Jose [name]:[id number] DOB: 10/28/1930 DOA: 05/01/2018  PCP: [name], MD  Patient coming from: Nursing home  Chief Complaint: Shortness of breath  HPI: Jose [name] is a 85 y.o. male with medical history significant of dysphasia, aspirating, congestive heart failure, dementia, hypertension who was just discharged in the hospital yesterday with aspiration pneumonia from [place] long sent home to the nursing home on [place].  Family reports he went home he ate today and he was coughing and choking a lot with eating.  Soon after eating he decompensated became very short of breath.  EMS was called his O2 sats were low he was placed on BiPAP.  Family does not know if he has been running fever they are not aware of any.  He does not ever complain of pain.  He has not had any nausea vomiting or diarrhea.  In the emergency department patient was placed on BiPAP and was also given nitroglycerin via EMS.  His blood pressures were normal on arrival but then started to decrease with systolics in the 81O patient was given 1 dose of Lasix.  Patient is awake and able to answer yes/no questions but cannot provide any further history due to his dementia.  Most of the history is obtained from his daughter and his wife who are at the bedside.  Patient is being referred for admission for acute hypoxic respiratory failure which is likely multifactorial in nature.  Review of Systems: As per HPI otherwise 10 point review of systems negative per wife unobtainable from patient due to his dementia  Past Medical History:  Diagnosis Date  . Abnormality of gait   . Backache, unspecified   . CHF (congestive heart failure) ([place])   . Colon polyps    adenomatous  . Dementia ([place]) 11/12/2012  . Depression   . Diabetes mellitus without complication ([place])   . Diverticulosis   . Essential and other specified forms of tremor   . Hemorrhoids   . History of bilateral hip replacements 11/12/2012    . Memory loss   . 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 Pain in joint, pelvic region and thigh   . Skin cancer of scalp   . Spinal stenosis, lumbar region, without neurogenic claudication   . Unspecified hereditary and idiopathic peripheral neuropathy     Past Surgical History:  Procedure Laterality Date  . CATARACT EXTRACTION, BILATERAL  2012  . JOINT REPLACEMENT    . RETINAL LASER PROCEDURE  2012   retinal wrinkle  . SKIN CANCER EXCISION    . TOTAL HIP ARTHROPLASTY Left 2000  . TOTAL HIP ARTHROPLASTY (aka REPLACEMENT) Right 2011     reports that he has quit smoking. His smoking use included cigarettes. He has never used smokeless tobacco. He reports current alcohol use of about 14.0 standard drinks of alcohol per week. He reports that he does not use drugs.  Allergies  Allergen Reactions  . Axona [Bacid]     Unknown per MAR  . Gabapentin     Hallucinations    Family History  Problem Relation Age of Onset  . Heart failure Mother     Prior to Admission medications   Medication Sig Start Date End Date Taking? Authorizing Provider  carvedilol (COREG) 3.125 MG tablet Take 1 tablet (3.125 mg total) by mouth 2 (two) times daily with a meal. 11/30/16  Yes Theodis Blaze, MD  cefdinir (OMNICEF) 300 MG  capsule Take 1 capsule (300 mg total) by mouth every 12 (twelve) hours for 2 days. 04/30/18 05/02/18 Yes Roney Jaffe, MD  docusate sodium (COLACE) 100 MG capsule Take 100 mg by mouth daily.   Yes [provider]  donepezil (ARICEPT) 23 MG TABS tablet Take 1 tablet (23 mg total) by mouth daily. 10/14/17  Yes Ward Givens, NP  dorzolamide-timolol (COSOPT) 22.3-6.8 MG/ML ophthalmic solution Place 1 drop into the left eye 2 (two) times daily.  09/20/12  Yes [provider]  Emollient (CERAVE) CREA Apply 1 application topically See admin instructions. Apply thin layer topically to arms, legs, and trunk once daily as needed for  skin care   Yes [provider]  ferrous sulfate 325 (65 FE) MG tablet Take 325 mg by mouth daily with breakfast.   Yes [provider]  Flaxseed, Linseed, (FLAXSEED OIL) 1000 MG CAPS Take 1,000 mg by mouth daily.     Yes [provider]  folic acid (FOLVITE) 269 MCG tablet Take 800 mcg by mouth every evening.    Yes [provider]  guaifenesin (ROBITUSSIN) 100 MG/5ML syrup Take 200 mg by mouth 2 (two) times daily as needed for cough.    Yes [provider]  hydrocortisone (ANUSOL-HC) 2.5 % rectal cream Place 1 application rectally at bedtime as needed for hemorrhoids or anal itching.   Yes [provider]  insulin glargine (LANTUS) 100 UNIT/ML injection Inject 10 Units into the skin at bedtime.    Yes [provider]  metFORMIN (GLUCOPHAGE) 500 MG tablet Take 500 mg by mouth 2 (two) times daily with a meal.   Yes [provider]  Multiple Vitamin (MULTIVITAMIN) tablet Take 1 tablet by mouth daily.     Yes [provider]  nystatin cream (MYCOSTATIN) Apply 1 application topically 2 (two) times daily as needed for dry skin. Apply to redness on  scrotum/sacral areas   Yes [provider]  Probiotic Product (ALIGN) 4 MG CAPS Take 4 mg by mouth daily.   Yes [provider]  simvastatin (ZOCOR) 5 MG tablet Take 5 mg by mouth daily.   Yes [provider]  zinc oxide 20 % ointment Apply 1 application topically 3 (three) times daily.    Yes [provider]  buPROPion (WELLBUTRIN SR) 100 MG 12 hr tablet Take 100 mg by mouth daily. 11/23/16   [provider]  Cholecalciferol (VITAMIN D) 1000 UNITS capsule Take 1,000 Units by mouth daily.      [provider]  furosemide (LASIX) 20 MG tablet Take 1 tablet (20 mg total) by mouth daily. Patient not taking: Reported on 05/01/2018 05/05/18   Roney Jaffe, MD  lisinopril (PRINIVIL,ZESTRIL) 5 MG tablet Take 1 tablet (5 mg total) by  mouth daily. Patient not taking: Reported on 05/01/2018 12/01/16   Theodis Blaze, MD  memantine (NAMENDA XR) 28 MG CP24 24 hr capsule Take 1 capsule (28 mg total) by mouth daily. Patient not taking: Reported on 05/01/2018 10/14/17   Ward Givens, NP  sitaGLIPtin (JANUVIA) 25 MG tablet Take 1 tablet (25 mg total) by mouth daily. Patient not taking: Reported on 05/01/2018 01/08/18   Sandrea Hughs, NP    Physical Exam: Vitals:   05/01/18 0115 05/01/18 0145 05/01/18 0200 05/01/18 0215  BP: (!) 89/74 95/71 91/67  (!) 86/56  Pulse: 82 84 83 81  Resp: (!) 23 (!) 24 18 (!) 25  Temp:      SpO2: 100% 99% 100% 100%  Weight:          Constitutional: NAD, calm, comfortable on BiPAP easily arouses Vitals:   05/01/18 0115 05/01/18 0145 05/01/18 0200 05/01/18 0215  BP: (!) 89/74 95/71 91/67  (!) 86/56  Pulse: 82 84 83 81  Resp: (!) 23 (!) 24 18 (!) 25  Temp:      SpO2: 100% 99% 100% 100%  Weight:       Eyes: PERRL, lids and conjunctivae normal ENMT: Mucous membranes are moist. Posterior pharynx clear of any exudate or lesions.Normal dentition.  Neck: normal, supple, no masses, no thyromegaly Respiratory: clear to auscultation bilaterally but diminished, no wheezing, no crackles. Normal respiratory effort. No accessory muscle use.  Cardiovascular: Regular rate and rhythm, no murmurs / rubs / gallops. No extremity edema. 2+ pedal pulses. No carotid bruits.  Abdomen: no tenderness, no masses palpated. No hepatosplenomegaly. Bowel sounds positive.  Musculoskeletal: no clubbing / cyanosis. No joint deformity upper and lower extremities. Good ROM, no contractures. Normal muscle tone.  Skin: no rashes, lesions, ulcers. No induration Neurologic: CN 2-12 grossly intact. Sensation intact, DTR normal. Strength 5/5 in all 4.  Psychiatric: Not normal judgment and insight. Alert and oriented x 2. Normal mood.    Labs on Admission: I have personally reviewed following labs and imaging  studies  CBC: Recent Labs  Lab 04/25/18 1853 04/26/18 0316 04/27/18 0321 04/27/18 1122 04/30/18 0534 05/01/18 0033 05/01/18 0034  WBC 9.2 7.5 5.4 5.9 8.8  --  13.1*  NEUTROABS 7.6  --  3.7 4.5  --   --  10.9*  HGB 8.7* 7.6* 8.1* 8.2* 8.2* 9.2* 9.4*  HCT 30.2* 27.0* 28.5* 29.1* 28.8* 27.0* 32.9*  MCV 89.9 94.1 92.8 94.8 91.1  --  89.9  PLT 137* 112* 128* 121* 143*  --  725   Basic Metabolic Panel: Recent Labs  Lab 04/26/18 0316 04/27/18 0321 04/27/18 1122 04/29/18 0414 04/30/18 0534 05/01/18 0033 05/01/18 0034  NA 148* 149* 148* 144 145 143 141  K 3.7 3.5 3.6 3.5 3.6 3.8 4.0  CL 115* 119* 116* 114* 118*  --  111  CO2 26 24 25 23 23   --  21*  GLUCOSE 168* 103* 237* 142* 133*  --  224*  BUN 34* 23 23 24* 16  --  15  CREATININE 1.32* 1.11 1.15 1.09 0.95  --  1.15  CALCIUM 8.2* 8.3* 8.4* 8.1* 8.3*  --  8.2*  MG 2.2  --   --   --   --   --   --   PHOS 3.3  --   --   --   --   --   --    GFR: Estimated Creatinine Clearance: 53.1 mL/min (by C-G formula based on SCr of 1.15 mg/dL). Liver Function Tests: Recent Labs  Lab 04/25/18 1853 04/27/18 0321 05/01/18 0034  AST 19 44* 47*  ALT 16 40 60*  ALKPHOS 75 91 111  BILITOT 0.4 0.5 0.6  PROT 6.3* 5.5* 6.2*  ALBUMIN 3.0* 2.5* 2.6*   No results for input(s): LIPASE, AMYLASE in the last 168 hours. No results for input(s): AMMONIA in the last 168 hours. Coagulation Profile: No results for input(s): INR, PROTIME in the last 168 hours. Cardiac Enzymes: Recent Labs  Lab 04/26/18 0003 04/26/18 0316 04/26/18 1203  TROPONINI 0.04* 0.07* 0.06*   BNP (last 3 results) No results for input(s): PROBNP in the last 8760 hours. HbA1C: No results for input(s): HGBA1C in the last 72 hours. CBG: Recent  Labs  Lab 04/29/18 1618 04/29/18 2116 04/30/18 0720 04/30/18 1141 04/30/18 1659  GLUCAP 202* 205* 122* 179* 161*   Lipid Profile: No results for input(s): CHOL, HDL, LDLCALC, TRIG, CHOLHDL, LDLDIRECT in the last 72  hours. Thyroid Function Tests: No results for input(s): TSH, T4TOTAL, FREET4, T3FREE, THYROIDAB in the last 72 hours. Anemia Panel: No results for input(s): VITAMINB12, FOLATE, FERRITIN, TIBC, IRON, RETICCTPCT in the last 72 hours. Urine analysis:    Component Value Date/Time   COLORURINE YELLOW 04/25/2018 2052   APPEARANCEUR CLEAR 04/25/2018 2052   LABSPEC 1.012 04/25/2018 2052   PHURINE 5.0 04/25/2018 2052   GLUCOSEU NEGATIVE 04/25/2018 2052   HGBUR NEGATIVE 04/25/2018 2052   BILIRUBINUR NEGATIVE 04/25/2018 2052   KETONESUR NEGATIVE 04/25/2018 2052   PROTEINUR NEGATIVE 04/25/2018 2052   UROBILINOGEN 0.2 06/24/2014 2112   NITRITE POSITIVE (A) 04/25/2018 2052   LEUKOCYTESUR SMALL (A) 04/25/2018 2052   Sepsis Labs: !!!!!!!!!!!!!!!!!!!!!!!!!!!!!!!!!!!!!!!!!!!! @LABRCNTIP (procalcitonin:4,lacticidven:4) ) Recent Results (from the past 240 hour(s))  Culture, blood (routine x 2)     Status: Abnormal   Collection Time: 04/25/18  6:54 PM  Result Value Ref Range Status   Specimen Description   Final    BLOOD LEFT FOREARM Performed at [facility], [place] [address], [place], [place] [address]    Special Requests   Final    BOTTLES DRAWN AEROBIC AND ANAEROBIC Blood Culture results may not be optimal due to an excessive volume of blood received in culture bottles Performed at [facility] [address], [place], [place] [address]    Culture  Setup Time   Final    GRAM POSITIVE COCCI ANAEROBIC BOTTLE ONLY CRITICAL RESULT CALLED TO, READ BACK BY AND VERIFIED WITH: B.GREEN,PHARMD AT 0046 ON 04/27/18 BY G.MCADOO    Culture (A)  Final    STAPHYLOCOCCUS SPECIES (COAGULASE NEGATIVE) THE SIGNIFICANCE OF ISOLATING THIS ORGANISM FROM A SINGLE SET OF BLOOD CULTURES WHEN MULTIPLE SETS ARE DRAWN IS UNCERTAIN. PLEASE NOTIFY THE MICROBIOLOGY DEPARTMENT WITHIN ONE WEEK IF SPECIATION AND SENSITIVITIES ARE REQUIRED. Performed at [facility], [place] [address]  [place],  [address]    Report Status 04/28/2018 FINAL  Final  Blood Culture ID Panel (Reflexed)     Status: Abnormal   Collection Time: 04/25/18  6:54 PM  Result Value Ref Range Status   Enterococcus species NOT DETECTED NOT DETECTED Final   Listeria monocytogenes NOT DETECTED NOT DETECTED Final   Staphylococcus species DETECTED (A) NOT DETECTED Final    Comment: Methicillin (oxacillin) susceptible coagulase negative staphylococcus. Possible blood culture contaminant (unless isolated from more than one blood culture draw or clinical case suggests pathogenicity). No antibiotic treatment is indicated for blood  culture contaminants. CRITICAL RESULT CALLED TO, READ BACK BY AND VERIFIED WITH: B.GREEN,PHARMD AT 0046 ON 04/27/18 BY G.MCADOO    Staphylococcus aureus (BCID) NOT DETECTED NOT DETECTED Final   Methicillin resistance NOT DETECTED NOT DETECTED Final   Streptococcus species NOT DETECTED NOT DETECTED Final   Streptococcus agalactiae NOT DETECTED NOT DETECTED Corrected    Comment: CORRECTED ON 01/26 AT 0102: PREVIOUSLY REPORTED AS NOT DETECTED CRITICAL RESULT CALLED TO, READ BACK BY AND VERIFIED WITH: B.GREEN,PHARMD AT 0046 ON 04/27/18 BY G.MCADOO   Streptococcus pneumoniae NOT DETECTED NOT DETECTED Final   Streptococcus pyogenes NOT DETECTED NOT DETECTED Final   Acinetobacter baumannii NOT DETECTED NOT DETECTED Final   Enterobacteriaceae species NOT DETECTED NOT DETECTED Final   Enterobacter cloacae complex NOT DETECTED NOT DETECTED Final  Escherichia coli NOT DETECTED NOT DETECTED Final   Klebsiella oxytoca NOT DETECTED NOT DETECTED Final   Klebsiella pneumoniae NOT DETECTED NOT DETECTED Final   Proteus species NOT DETECTED NOT DETECTED Final   Serratia marcescens NOT DETECTED NOT DETECTED Final   Haemophilus influenzae NOT DETECTED NOT DETECTED Final   Neisseria meningitidis NOT DETECTED NOT DETECTED Final   Pseudomonas aeruginosa NOT DETECTED NOT DETECTED Final   Candida albicans  NOT DETECTED NOT DETECTED Final   Candida glabrata NOT DETECTED NOT DETECTED Final   Candida krusei NOT DETECTED NOT DETECTED Final   Candida parapsilosis NOT DETECTED NOT DETECTED Final   Candida tropicalis NOT DETECTED NOT DETECTED Final    Comment: Performed at [facility], [place] [address], [place], [place] [address]  Culture, blood (routine x 2)     Status: None (Preliminary result)   Collection Time: 04/25/18  7:18 PM  Result Value Ref Range Status   Specimen Description   Final    BLOOD RIGHT HAND Performed at [facility], [place] [address] [place], [place] [address]    Special Requests   Final    BOTTLES DRAWN AEROBIC AND ANAEROBIC Blood Culture adequate volume Performed at [facility] [address], [place], [place] [address]    Culture   Final    NO GROWTH 4 DAYS Performed at [facility], [place] [address] [place], [place] [address]    Report Status PENDING  Incomplete  Urine culture     Status: Abnormal   Collection Time: 04/25/18  8:52 PM  Result Value Ref Range Status   Specimen Description URINE, RANDOM  Final   Special Requests   Final    NONE Performed at [facility] [address], [place], [place] [address]    Culture >=100,000 COLONIES/mL ESCHERICHIA COLI (A)  Final   Report Status 04/28/2018 FINAL  Final   Organism ID, Bacteria ESCHERICHIA COLI (A)  Final      Susceptibility   Escherichia coli - MIC*    AMPICILLIN <=2 SENSITIVE Sensitive     CEFAZOLIN <=4 SENSITIVE Sensitive     CEFTRIAXONE <=1 SENSITIVE Sensitive     CIPROFLOXACIN <=0.25 SENSITIVE Sensitive     GENTAMICIN <=1 SENSITIVE Sensitive     IMIPENEM <=0.25 SENSITIVE Sensitive     NITROFURANTOIN <=16 SENSITIVE Sensitive     TRIMETH/SULFA <=20 SENSITIVE Sensitive     AMPICILLIN/SULBACTAM <=2 SENSITIVE Sensitive     PIP/TAZO <=4 SENSITIVE Sensitive     Extended ESBL NEGATIVE Sensitive     * >=100,000 COLONIES/mL ESCHERICHIA COLI   Respiratory Panel by PCR     Status: Abnormal   Collection Time: 04/26/18 12:28 AM  Result Value Ref Range Status   Adenovirus NOT DETECTED NOT DETECTED Final   Coronavirus 229E NOT DETECTED NOT DETECTED Final   Coronavirus HKU1 NOT DETECTED NOT DETECTED Final   Coronavirus NL63 NOT DETECTED NOT DETECTED Final   Coronavirus OC43 NOT DETECTED NOT DETECTED Final   Metapneumovirus NOT DETECTED NOT DETECTED Final   Rhinovirus / Enterovirus NOT DETECTED NOT DETECTED Final   Influenza A NOT DETECTED NOT DETECTED Final   Influenza B NOT DETECTED NOT DETECTED Final   Parainfluenza Virus 1 NOT DETECTED NOT DETECTED Final   Parainfluenza Virus 2 NOT DETECTED NOT DETECTED Final   Parainfluenza Virus 3 NOT DETECTED NOT DETECTED Final   Parainfluenza Virus 4 NOT DETECTED NOT DETECTED Final   Respiratory Syncytial Virus DETECTED (A) NOT  DETECTED Final    Comment: CRITICAL RESULT CALLED TO, READ BACK BY AND VERIFIED WITH: RN B MAY [phone] MLM    Bordetella pertussis NOT DETECTED NOT DETECTED Final   Chlamydophila pneumoniae NOT DETECTED NOT DETECTED Final   Mycoplasma pneumoniae NOT DETECTED NOT DETECTED Final    Comment: Performed at [facility], [place] [address], [place], [place] [address]  MRSA PCR Screening     Status: None   Collection Time: 04/26/18  2:05 AM  Result Value Ref Range Status   MRSA by PCR NEGATIVE NEGATIVE Final    Comment:        The GeneXpert MRSA Assay (FDA approved for NASAL specimens only), is one component of a comprehensive MRSA colonization surveillance program. It is not intended to diagnose MRSA infection nor to guide or monitor treatment for MRSA infections. Performed at [facility], [place] [address] [place], [place] [address]      Radiological Exams on Admission: Dg Chest Portable 1 View  Result Date: 05/01/2018 CLINICAL DATA:  Shortness of breath. Recent left lower lobe pneumonia. EXAM: PORTABLE CHEST 1 VIEW COMPARISON:   Radiographs 3 days ago 04/28/2018, additional priors. FINDINGS: No significant change in left lung base opacity from prior exam. Unchanged right perihilar atelectasis. Unchanged heart size and mediastinal contours. Unchanged low lung volumes. Mild basilar septal thickening and peribronchial thickening suspicious for mild pulmonary edema. No pneumothorax. IMPRESSION: 1. No significant change in left lung base opacity from prior exam. Unchanged right perihilar atelectasis. 2. Mild basilar septal and peribronchial thickening, suspicious for pulmonary edema. Electronically Signed   By: Keith Rake M.D.   On: 05/01/2018 00:52   Dg Swallowing Func-speech Pathology  Result Date: 04/29/2018 Objective Swallowing Evaluation: Type of Study: MBS-Modified Barium Swallow Study  Patient Details Name: Jose [name] MRN: [id number] Date of Birth: 10/06/1930 Today's Date: 04/29/2018 Time: SLP Start Time (ACUTE ONLY): 1210 -SLP Stop Time (ACUTE ONLY): 1230 SLP Time Calculation (min) (ACUTE ONLY): 20 min Past Medical History: Past Medical History: Diagnosis Date . Abnormality of gait  . Backache, unspecified  . CHF (congestive heart failure) ([place])  . Colon polyps   adenomatous . Dementia ([place]) 11/12/2012 . Depression  . Diabetes mellitus without complication ([place])  . Diverticulosis  . Essential and other specified forms of tremor  . Hemorrhoids  . History of bilateral hip replacements 11/12/2012 . Memory loss  . Other persistent mental disorders due to conditions classified elsewhere  . Pain in joint, pelvic region and thigh  . Skin cancer of scalp  . Spinal stenosis, lumbar region, without neurogenic claudication  . Unspecified hereditary and idiopathic peripheral neuropathy  Past Surgical History: Past Surgical History: Procedure Laterality Date . CATARACT EXTRACTION, BILATERAL  2012 . JOINT REPLACEMENT   . RETINAL LASER PROCEDURE  2012  retinal wrinkle . SKIN CANCER EXCISION   . TOTAL HIP ARTHROPLASTY Left 2000 . TOTAL HIP  ARTHROPLASTY (aka REPLACEMENT) Right 8546 HPI: 85 year old man with history of CHF, dementia, DM, HTN, iron deficiency anemia presenting with altered mental status. Unable to obtain history from patient due to acute encephalopathy and underlying dementia. Marland Kitchen He has been having respiratory infection symptoms for the past 1.5 to 2 weeks, including cough that sounds productive. Patient had MBS 03/2017 with mild oropharyngeal dysphagia and discharged with Dys 1, thin liquid diet.  Subjective: alert, cooperative Assessment / Plan / Recommendation CHL IP CLINICAL IMPRESSIONS 04/29/2018 Clinical Impression Patient has mild oropharyngeal dysphagia. Thin liquids with cup sip were  aspirated silently when bolus presented by cup, by examiner (trace). However no penetration or aspiration were noted when patient administered bolus himself. He had a natural chin tuck, that he initiated after the swallow that did not impact the swallow negatively or positively. All swallows were delayed, initiated at the valleculae with all consistencies and volumes. When patient was administered bolus by examiner, a larger bolus was given and the swallow was initiated at the pyriform sinuses and that is also when the silent aspiration occured. Patient has mild to moderated residue after all bolus types and sizes. Recommend a Regular diet, thin liquids with small bolus sizes. Self administered if possible, followed by a second swallow to attempt to clear residue. Medication whole in puree. Assistence of staff with meals to ensrue second swallow and bolus size/portion. SLP Visit Diagnosis Dysphagia, oropharyngeal phase (R13.12) Attention and concentration deficit following -- Frontal lobe and executive function deficit following -- Impact on safety and function Mild aspiration risk   CHL IP TREATMENT RECOMMENDATION 04/29/2018 Treatment Recommendations Therapy as outlined in treatment plan below   Prognosis 04/29/2018 Prognosis for Safe Diet Advancement  Good Barriers to Reach Goals Cognitive deficits Barriers/Prognosis Comment -- CHL IP DIET RECOMMENDATION 04/29/2018 SLP Diet Recommendations Regular solids;Thin liquid Liquid Administration via Cup;No straw Medication Administration Whole meds with puree Compensations Minimize environmental distractions;Small sips/bites;Slow rate;Multiple dry swallows after each bite/sip Postural Changes Remain semi-upright after after feeds/meals (Comment);Seated upright at 90 degrees   CHL IP OTHER RECOMMENDATIONS 04/29/2018 Recommended Consults -- Oral Care Recommendations Oral care BID Other Recommendations --   CHL IP FOLLOW UP RECOMMENDATIONS 03/31/2017 Follow up Recommendations Skilled Nursing facility   [facility] IP FREQUENCY AND DURATION 04/29/2018 Speech Therapy Frequency (ACUTE ONLY) min 2x/week Treatment Duration 2 weeks      CHL IP ORAL PHASE 04/29/2018 Oral Phase WFL Oral - Pudding Teaspoon -- Oral - Pudding Cup -- Oral - Honey Teaspoon -- Oral - Honey Cup -- Oral - Nectar Teaspoon -- Oral - Nectar Cup -- Oral - Nectar Straw -- Oral - Thin Teaspoon -- Oral - Thin Cup -- Oral - Thin Straw -- Oral - Puree -- Oral - Mech Soft -- Oral - Regular -- Oral - Multi-Consistency -- Oral - Pill -- Oral Phase - Comment --  CHL IP PHARYNGEAL PHASE 04/29/2018 Pharyngeal Phase Impaired Pharyngeal- Pudding Teaspoon Delayed swallow initiation-vallecula;Pharyngeal residue - valleculae Pharyngeal -- Pharyngeal- Pudding Cup -- Pharyngeal -- Pharyngeal- Honey Teaspoon -- Pharyngeal -- Pharyngeal- Honey Cup -- Pharyngeal -- Pharyngeal- Nectar Teaspoon -- Pharyngeal -- Pharyngeal- Nectar Cup Delayed swallow initiation-vallecula;Pharyngeal residue - valleculae Pharyngeal -- Pharyngeal- Nectar Straw -- Pharyngeal -- Pharyngeal- Thin Teaspoon -- Pharyngeal -- Pharyngeal- Thin Cup Delayed swallow initiation-pyriform sinuses;Reduced airway/laryngeal closure;Reduced anterior laryngeal mobility;Penetration/Aspiration during swallow;Trace aspiration;Pharyngeal  residue - valleculae Pharyngeal Material enters airway, passes BELOW cords without attempt by patient to eject out (silent aspiration) Pharyngeal- Thin Straw -- Pharyngeal -- Pharyngeal- Puree -- Pharyngeal -- Pharyngeal- Mechanical Soft Delayed swallow initiation-vallecula;Reduced laryngeal elevation;Pharyngeal residue - valleculae Pharyngeal -- Pharyngeal- Regular -- Pharyngeal -- Pharyngeal- Multi-consistency -- Pharyngeal -- Pharyngeal- Pill -- Pharyngeal -- Pharyngeal Comment --  CHL IP CERVICAL ESOPHAGEAL PHASE 04/29/2018 Cervical Esophageal Phase WFL Pudding Teaspoon -- Pudding Cup -- Honey Teaspoon -- Honey Cup -- Nectar Teaspoon -- Nectar Cup -- Nectar Straw -- Thin Teaspoon -- Thin Cup -- Thin Straw -- Puree -- Mechanical Soft -- Regular -- Multi-consistency -- Pill -- Cervical Esophageal Comment -- Jose [name] Ward 04/29/2018, 1:00 PM  EKG: Independently reviewed.  Mild sinus tachycardia with rate of 101 with no acute changes Old chart reviewed Case discussed with Dr. Leonides Schanz in the ED   Assessment/Plan 85 year old male with multiple medical problems comes in with acute hypoxic respiratory failure on BiPAP likely multifactorial Principal Problem:   Acute respiratory failure with hypoxia (HCC)-suspect this is mostly due to aspirating again.  Continue BiPAP and wean as tolerates.  Empirically cover with Levaquin.  Chest x-ray is really unchanged from previous.  Chronically silently aspirating.  Does not particular look volume overloaded.  Active Problems:   Dysphagia, oropharyngeal-keep n.p.o. at this time.  Placed on antibiotics as above.  Per history it sounds like he aspirated again this afternoon while eating.    CHF (congestive heart failure) (HCC)-holding all diuretics and blood pressure meds at this time secondary to hypotension in the ED.    Alzheimer disease (HCC)-noted  Soft blood pressures in the ED-patient just given a dose of Lasix 40 mg IV in the ED however I think his  blood pressure is probably soft due to the nitroglycerin that he was given via EMS.  We will hold all diuretics and all of his blood pressure medications at this time.  We will give him a small 500 cc IV fluid bolus now.  I spoke to his wife about his CODE STATUS.  She wishes for him to be a full code at this time.  Wife seems to have poor insight into his aspiration issues and does not understand why keeps getting sick.  They may benefit from palliative care consultation.   DVT prophylaxis: SCDs Code Status: Full Family Communication: Wife and daughter Disposition Plan: Days Consults called: None Admission status: Admission   Montine Hight A MD [facility]  If 7PM-7AM, please contact night-coverage [email or web address] Password [email or web address]  05/01/2018, 2:44 AM

## 2018-05-01 NOTE — ED Provider Notes (Addendum)
TIME SEEN: 12:37 AM  CHIEF COMPLAINT: Respiratory distress  HPI: Patient is an 85 year old male with history of dementia, hypertension, diabetes, CHF who presents to the emergency department with EMS from friend's home [place].  They were called out for shortness of breath.  Patient's oxygen saturation was in the 70s on room air.  They placed him on CPAP with some improvement.  Given then 1 nitroglycerin as they thought this was a CHF exacerbation.  Initial blood pressure was 140s/90s.  Now in the 120s/80s.  Recently admitted to the hospital 1/24 through 1/29 for left lower lobe aspiration pneumonia, RSV and an E. coli UTI.  Patient denies any pain at this time.  ROS: Level 5 caveat for dementia and respiratory distress  PAST MEDICAL HISTORY/PAST SURGICAL HISTORY:  Past Medical History:  Diagnosis Date  . Abnormality of gait   . Backache, unspecified   . CHF (congestive heart failure) ([place])   . Colon polyps    adenomatous  . Dementia ([place]) 11/12/2012  . Depression   . Diabetes mellitus without complication ([place])   . Diverticulosis   . Essential and other specified forms of tremor   . Hemorrhoids   . History of bilateral hip replacements 11/12/2012  . Memory loss   . 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 Pain in joint, pelvic region and thigh   . Skin cancer of scalp   . Spinal stenosis, lumbar region, without neurogenic claudication   . Unspecified hereditary and idiopathic peripheral neuropathy     MEDICATIONS:  Prior to Admission medications   Medication Sig Start Date End Date Taking? Authorizing Provider  buPROPion (WELLBUTRIN SR) 100 MG 12 hr tablet Take 100 mg by mouth daily. 11/23/16   [provider]  carvedilol (COREG) 3.125 MG tablet Take 1 tablet (3.125 mg total) by mouth 2 (two) times daily with a meal. 11/30/16   Theodis Blaze, MD  cefdinir (OMNICEF) 300 MG capsule Take 1 capsule (300 mg total) by mouth every 12 (twelve) hours  for 2 days. 04/30/18 05/02/18  Roney Jaffe, MD  Cholecalciferol (VITAMIN D) 1000 UNITS capsule Take 1,000 Units by mouth daily.      [provider]  docusate sodium (COLACE) 100 MG capsule Take 100 mg by mouth daily.    [provider]  donepezil (ARICEPT) 23 MG TABS tablet Take 1 tablet (23 mg total) by mouth daily. 10/14/17    Givens, NP  dorzolamide-timolol (COSOPT) 22.3-6.8 MG/ML ophthalmic solution Place 1 drop into the left eye 2 (two) times daily.  09/20/12   [provider]  Emollient (CERAVE) CREA Apply thin layer topically to arms, legs, and trunk once daily as needed    [provider]  ferrous sulfate 325 (65 FE) MG tablet Take 325 mg by mouth daily with breakfast.    [provider]  Flaxseed, Linseed, (FLAXSEED OIL) 1000 MG CAPS Take 1,000 mg by mouth daily.      [provider]  folic acid (FOLVITE) 854 MCG tablet Take 800 mcg by mouth every evening.     [provider]  furosemide (LASIX) 20 MG tablet Take 1 tablet (20 mg total) by mouth daily. 05/05/18   Roney Jaffe, MD  guaifenesin (ROBITUSSIN) 100 MG/5ML syrup Take 200 mg by mouth every 12 (twelve) hours.    [provider]  hydrocortisone (ANUSOL-HC) 2.5 % rectal cream Place 1 application rectally at bedtime as needed for hemorrhoids or anal itching.  [provider]  insulin glargine (LANTUS) 100 UNIT/ML injection Inject 10 Units into the skin at bedtime.     [provider]  lisinopril (PRINIVIL,ZESTRIL) 5 MG tablet Take 1 tablet (5 mg total) by mouth daily. 12/01/16   Theodis Blaze, MD  memantine (NAMENDA XR) 28 MG CP24 24 hr capsule Take 1 capsule (28 mg total) by mouth daily. 10/14/17    Givens, NP  metFORMIN (GLUCOPHAGE) 500 MG tablet Take 500 mg by mouth 2 (two) times daily with a meal.    [provider]  Multiple Vitamin (MULTIVITAMIN) tablet Take 1 tablet by mouth daily.      [provider]   nystatin cream (MYCOSTATIN) Apply 1 application topically 2 (two) times daily as needed for dry skin. Apply to redness on  scrotum/sacral areas    [provider]  Probiotic Product (ALIGN) 4 MG CAPS Take 4 mg by mouth daily.    [provider]  simvastatin (ZOCOR) 5 MG tablet Take 5 mg by mouth daily.    [provider]  sitaGLIPtin (JANUVIA) 25 MG tablet Take 1 tablet (25 mg total) by mouth daily. 01/08/18   [name], Dinah C, NP  zinc oxide 20 % ointment Apply 1 application topically 3 (three) times daily as needed for irritation.    [provider]    ALLERGIES:  Allergies  Allergen Reactions  . Axona [Bacid]     Unknown per MAR  . Gabapentin     Hallucinations    SOCIAL HISTORY:  Social History   Tobacco Use  . Smoking status: Former Smoker    Types: Cigarettes  . Smokeless tobacco: Never Used  Substance Use Topics  . Alcohol use: Yes    Alcohol/week: 14.0 standard drinks    Types: 14 Glasses of wine per week    Comment: occas, one glass of wine before dinner,sometimes 1/2 glass    FAMILY HISTORY: Family History  Problem Relation Age of Onset  . Heart failure Mother     EXAM: Pulse 92   Temp 98.7 F (37.1 C)   Resp (!) 24   Wt 88 kg   SpO2 93%   BMI 25.60 kg/m BP 102/57 CONSTITUTIONAL: Alert and in respiratory distress.  On CPAP.  Elderly, demented HEAD: Normocephalic EYES: Conjunctivae clear, pupils appear equal, EOMI ENT: normal nose; moist mucous membranes NECK: Supple, no meningismus, no nuchal rigidity, no LAD  CARD: Regular and tachycardic; S1 and S2 appreciated; no murmurs, no clicks, no rubs, no gallops RESP: Patient is tachypneic, sats 87% on room air briefly when changing him from CPAP to BiPAP.  He has diminished aeration diffusely without significant wheezing, rhonchi or rales noted.  Unable to speak full sentences. ABD/GI: Normal bowel sounds; non-distended; soft, non-tender, no rebound, no guarding, no  peritoneal signs, no hepatosplenomegaly BACK:  The back appears normal and is non-tender to palpation, there is no CVA tenderness EXT: Normal ROM in all joints; non-tender to palpation; no edema; normal capillary refill; no cyanosis, no calf tenderness or swelling    SKIN: Normal color for age and race; warm; no rash NEURO: Moves all extremities equally PSYCH: The patient's mood and manner are appropriate. Grooming and personal hygiene are appropriate.  MEDICAL DECISION MAKING: Patient here with respiratory distress.  Recently admitted for sepsis.  He is afebrile today.  Differential includes pneumonia, flu, CHF exacerbation, PE.  Will obtain labs, chest x-ray, flu swab.  ABG shows pH of 7.45, PCO2 of 31, PO2 118.  We will keep him on BiPAP at this time for comfort.  ED PROGRESS: Chest x-ray shows no significant change in the left lung base opacity.  Will give IV Levaquin.  Also has pulmonary edema on chest x-ray.  Will give IV Lasix.  Blood pressure normal.  Will avoid IV fluids at this time.   2:28 AM Discussed patient's case with hospitalist, Dr. Shanon Brow.  I have recommended admission and patient (and family if present) agree with this plan. Admitting physician will place admission orders.   I reviewed all nursing notes, vitals, pertinent previous records, EKGs, lab and urine results, imaging (as available).  Patient's blood pressure is now in the 56F to 53P systolic.  We will continue to monitor closely.  He did receive IV Lasix for volume overload prior to hypotension.  Hospitalist aware of blood pressures.  Blood pressure was 140/70 with EMS.  May be secondary to nitroglycerin that he received prior to arrival.  Hospitalist will give small fluid bolus.   EKG Interpretation  Date/Time:  Thursday May 01 2018 00:20:43 EST Ventricular Rate:  101 PR Interval:    QRS Duration: 87 QT Interval:  372 QTC Calculation: 483 R Axis:   -18 Text Interpretation:  Sinus tachycardia Borderline left  axis deviation Borderline prolonged QT interval No significant change since last tracing Confirmed by Pryor Curia [phone]) on 05/01/2018 12:36:40 AM        CRITICAL CARE Performed by: Cyril Mourning Brixton Franko   Total critical care time: 65 minutes  Critical care time was exclusive of separately billable procedures and treating other patients.  Critical care was necessary to treat or prevent imminent or life-threatening deterioration.  Critical care was time spent personally by me on the following activities: development of treatment plan with patient and/or surrogate as well as nursing, discussions with consultants, evaluation of patient's response to treatment, examination of patient, obtaining history from patient or surrogate, ordering and performing treatments and interventions, ordering and review of laboratory studies, ordering and review of radiographic studies, pulse oximetry and re-evaluation of patient's condition.     Quinlynn Cuthbert, Delice Bison, DO 05/01/18 0230    Cambria Osten, Delice Bison, DO 05/01/18 [id number]

## 2018-05-02 DIAGNOSIS — R1312 Dysphagia, oropharyngeal phase: Secondary | ICD-10-CM

## 2018-05-02 DIAGNOSIS — Z515 Encounter for palliative care: Secondary | ICD-10-CM

## 2018-05-02 DIAGNOSIS — F0281 Dementia in other diseases classified elsewhere with behavioral disturbance: Secondary | ICD-10-CM

## 2018-05-02 DIAGNOSIS — J9601 Acute respiratory failure with hypoxia: Secondary | ICD-10-CM

## 2018-05-02 DIAGNOSIS — I509 Heart failure, unspecified: Secondary | ICD-10-CM

## 2018-05-02 DIAGNOSIS — G309 Alzheimer's disease, unspecified: Secondary | ICD-10-CM

## 2018-05-02 DIAGNOSIS — Z7189 Other specified counseling: Secondary | ICD-10-CM

## 2018-05-02 LAB — GLUCOSE, CAPILLARY
Glucose-Capillary: 93 mg/dL (ref 70–99)
Glucose-Capillary: 104 mg/dL — ABNORMAL HIGH (ref 70–99)
Glucose-Capillary: 117 mg/dL — ABNORMAL HIGH (ref 70–99)
Glucose-Capillary: 172 mg/dL — ABNORMAL HIGH (ref 70–99)
Glucose-Capillary: 200 mg/dL — ABNORMAL HIGH (ref 70–99)
Glucose-Capillary: 105 mg/dL — ABNORMAL HIGH (ref 70–99)

## 2018-05-02 MED ORDER — INSULIN GLARGINE 100 UNIT/ML ~~LOC~~ SOLN
10.0000 [IU] | Freq: Every day | SUBCUTANEOUS | Status: DC
  Administered 2018-05-02: 10 [IU] via SUBCUTANEOUS
  Filled 2018-05-02 (×2): qty 0.1

## 2018-05-02 MED ORDER — DONEPEZIL HCL 23 MG PO TABS
23.0000 mg | ORAL_TABLET | Freq: Every day | ORAL | Status: DC
  Administered 2018-05-02 – 2018-05-05 (×4): 23 mg via ORAL
  Filled 2018-05-02 (×4): qty 1

## 2018-05-02 MED ORDER — DORZOLAMIDE HCL-TIMOLOL MAL 2-0.5 % OP SOLN
1.0000 [drp] | Freq: Two times a day (BID) | OPHTHALMIC | Status: DC
  Administered 2018-05-02 – 2018-05-05 (×7): 1 [drp] via OPHTHALMIC
  Filled 2018-05-02: qty 10

## 2018-05-02 MED ORDER — CARVEDILOL 3.125 MG PO TABS
3.1250 mg | ORAL_TABLET | Freq: Two times a day (BID) | ORAL | Status: DC
  Administered 2018-05-02 – 2018-05-05 (×7): 3.125 mg via ORAL
  Filled 2018-05-02 (×7): qty 1

## 2018-05-02 MED ORDER — LISINOPRIL 5 MG PO TABS: 5.0000 mg | ORAL_TABLET | Freq: Every day | ORAL | Status: DC

## 2018-05-02 NOTE — Discharge Instructions (Signed)
He needs a dysphagia 1 diet no straws he needs to be upright 90 degrees when eating and staying up for about 30 minutes to an hour.

## 2018-05-02 NOTE — Progress Notes (Signed)
[facility] PROGRESS NOTE    Progress Note  Jose [name]  YPP:[id number] DOB: 15-Mar-1931 DOA: 05/01/2018 PCP: [name], MD     Brief Narrative:   Jose [name] is an 85 y.o. male past medical history significant for dysphasia, colic heart failure with an EF of 35% on April 26, 2018 with very mild aortic stenosis during this echo, on admission had aspiration pneumonia from [facility] long hospital, primary relates that when he went home he ate and started coughing and choking.  He became very short of breath EMS was called saturations were low he was placed on BiPAP.  Assessment/Plan:   Acute respiratory failure with hypoxia (HCC) in the setting of aspiration pneumonia: He was placed on BiPAP started empirically on IV Unasyn. Satting 90% on BiPAP non-tachycardic blood pressure stable. Family has no insight on worsening condition, they just relate that if we can fix his diet he could probably go back home. We will consult hospice and palliative care. He was evaluated by speech that showed oropharyngeal dysphagia with laryngeal penetration and aspiration with significant residual they recommended a dysphagia 1 diet no straws. Allow diet.  Dysphagia: On speech evaluation 2 days ago they recommended Dys 1 diet with thin liquids.  Chronic systolic heart failure: Medications were held due to hypotensive episode in the ED. Blood pressure is stable. Start her Coreg continue to hold her ACE inhibitor.  Alzheimer disease [facility]): Continue current meds.  Essential hypertension: Continue to hold antihypertensive medication blood pressure is soft. Resume Coreg, continue to hold lisinopril and Lasix.  Insulin-dependent diabetes mellitus type 2: We will start him on lighting scale insulin he is currently n.p.o. Continue long-acting insulin and oral hypoglycemic agents.  Acute encephalopathy: Likely due to hypoxic and aspiration episode as per caregiver now improved.  Dementia  without behavioral disturbances: We will resume probiotic memantine and benazepril.  Severe protein caloric malnutrition:  DVT prophylaxis: lovenox Family Communication:daughter Disposition Plan/Barrier to D/C: unable to determine Code Status:     Code Status Orders  (From admission, onward)         Start     Ordered   05/01/18 0246  Full code  Continuous     05/01/18 0248        Code Status History    Date Active Date Inactive Code Status Order ID Comments User Context   04/26/2018 0008 04/30/2018 2213 Full Code [id number]  Milagros Loll, MD ED   03/29/2017 1426 04/01/2017 1658 Full Code [id number]  Rondel Jumbo, PA-C ED   11/28/2016 2051 11/30/2016 1812 Full Code [id number]  Charlynne Cousins, MD ED   06/22/2016 1523 06/23/2016 2135 Full Code [id number]  Caren Griffins, MD Inpatient        IV Access:    Peripheral IV   Procedures and diagnostic studies:   Dg Chest Portable 1 View  Result Date: 05/01/2018 CLINICAL DATA:  Shortness of breath. Recent left lower lobe pneumonia. EXAM: PORTABLE CHEST 1 VIEW COMPARISON:  Radiographs 3 days ago 04/28/2018, additional priors. FINDINGS: No significant change in left lung base opacity from prior exam. Unchanged right perihilar atelectasis. Unchanged heart size and mediastinal contours. Unchanged low lung volumes. Mild basilar septal thickening and peribronchial thickening suspicious for mild pulmonary edema. No pneumothorax. IMPRESSION: 1. No significant change in left lung base opacity from prior exam. Unchanged right perihilar atelectasis. 2. Mild basilar septal and peribronchial thickening, suspicious for pulmonary edema. Electronically Signed   By: Aurther Loft.D.  On: 05/01/2018 00:52     Medical Consultants:    None.  Anti-Infectives:   IV Unasyn  Subjective:    Jose [name] more awake today able to carry on a conversation.  Objective:    Vitals:   05/02/18 0431 05/02/18 0432 05/02/18 0500  05/02/18 0636  BP: 123/62 123/62    Pulse: 84 76    Resp: (!) 25 (!) 26    Temp: 98.6 F (37 C)     TempSrc: Axillary     SpO2: 94% 94%    Weight:   62.6 kg 62.6 kg    Intake/Output Summary (Last 24 hours) at 05/02/2018 0755 Last data filed at 05/02/2018 0235 Gross per 24 hour  Intake 400 ml  Output 1600 ml  Net -1200 ml   Filed Weights   05/01/18 0024 05/02/18 0500 05/02/18 0636  Weight: 88 kg 62.6 kg 62.6 kg    Exam: General exam: In no acute distress cachectic Respiratory system: Good air movement and clear to auscultation today. Cardiovascular system: S1 & S2 heard, RRR. Gastrointestinal system: Soft nontender nondistended Central nervous system: Awake alert moving all 4 extremities without any difficulties, external ocular movements are intact. Extremities: No pedal edema. Skin: No rashes, lesions or ulcers    Data Reviewed:    Labs: Basic Metabolic Panel: Recent Labs  Lab 04/26/18 0316  04/27/18 1122 04/29/18 0414 04/30/18 0534 05/01/18 0033 05/01/18 0034 05/01/18 0607  NA 148*   < > 148* 144 145 143 141 144  K 3.7   < > 3.6 3.5 3.6 3.8 4.0 4.1  CL 115*   < > 116* 114* 118*  --  111 111  CO2 26   < > 25 23 23   --  21* 24  GLUCOSE 168*   < > 237* 142* 133*  --  224* 163*  BUN 34*   < > 23 24* 16  --  15 15  CREATININE 1.32*   < > 1.15 1.09 0.95  --  1.15 1.15  CALCIUM 8.2*   < > 8.4* 8.1* 8.3*  --  8.2* 8.0*  MG 2.2  --   --   --   --   --   --   --   PHOS 3.3  --   --   --   --   --   --   --    < > = values in this interval not displayed.   GFR Estimated Creatinine Clearance: 41.6 mL/min (by C-G formula based on SCr of 1.15 mg/dL). Liver Function Tests: Recent Labs  Lab 04/25/18 1853 04/27/18 0321 05/01/18 0034  AST 19 44* 47*  ALT 16 40 60*  ALKPHOS 75 91 111  BILITOT 0.4 0.5 0.6  PROT 6.3* 5.5* 6.2*  ALBUMIN 3.0* 2.5* 2.6*   No results for input(s): LIPASE, AMYLASE in the last 168 hours. No results for input(s): AMMONIA in the last  168 hours. Coagulation profile No results for input(s): INR, PROTIME in the last 168 hours.  CBC: Recent Labs  Lab 04/25/18 1853  04/27/18 0321 04/27/18 1122 04/30/18 0534 05/01/18 0033 05/01/18 0034 05/01/18 0607  WBC 9.2   < > 5.4 5.9 8.8  --  13.1* 10.9*  NEUTROABS 7.6  --  3.7 4.5  --   --  10.9*  --   HGB 8.7*   < > 8.1* 8.2* 8.2* 9.2* 9.4* 9.4*  HCT 30.2*   < > 28.5* 29.1* 28.8* 27.0* 32.9* 31.8*  MCV  89.9   < > 92.8 94.8 91.1  --  89.9 89.1  PLT 137*   < > 128* 121* 143*  --  185 134*   < > = values in this interval not displayed.   Cardiac Enzymes: Recent Labs  Lab 04/26/18 0003 04/26/18 0316 04/26/18 1203  TROPONINI 0.04* 0.07* 0.06*   BNP (last 3 results) No results for input(s): PROBNP in the last 8760 hours. CBG: Recent Labs  Lab 05/01/18 1140 05/01/18 1608 05/01/18 1957 05/02/18 0100 05/02/18 0430  GLUCAP 106* 120* 112* 93 104*   D-Dimer: No results for input(s): DDIMER in the last 72 hours. Hgb A1c: No results for input(s): HGBA1C in the last 72 hours. Lipid Profile: No results for input(s): CHOL, HDL, LDLCALC, TRIG, CHOLHDL, LDLDIRECT in the last 72 hours. Thyroid function studies: No results for input(s): TSH, T4TOTAL, T3FREE, THYROIDAB in the last 72 hours.  Invalid input(s): FREET3 Anemia work up: No results for input(s): VITAMINB12, FOLATE, FERRITIN, TIBC, IRON, RETICCTPCT in the last 72 hours. Sepsis Labs: Recent Labs  Lab 04/25/18 2130  04/26/18 1055 04/27/18 0009 04/27/18 0321 04/27/18 1122 04/28/18 0314 04/30/18 0534 05/01/18 0034 05/01/18 1610  PROCALCITON  --   --  0.51  --  0.45  --  0.28  --   --   --   WBC  --    < >  --   --  5.4 5.9  --  8.8 13.1* 10.9*  LATICACIDVEN 4.6*  --   --  1.0 1.2  --   --   --  1.4  --    < > = values in this interval not displayed.   Microbiology Recent Results (from the past 240 hour(s))  Culture, blood (routine x 2)     Status: Abnormal   Collection Time: 04/25/18  6:54 PM  Result  Value Ref Range Status   Specimen Description   Final    BLOOD LEFT FOREARM Performed at [facility], [address], [place], [place] [address]    Special Requests   Final    BOTTLES DRAWN AEROBIC AND ANAEROBIC Blood Culture results may not be optimal due to an excessive volume of blood received in culture bottles Performed at [facility] [address] [place], [place] [address]    Culture  Setup Time   Final    GRAM POSITIVE COCCI ANAEROBIC BOTTLE ONLY CRITICAL RESULT CALLED TO, READ BACK BY AND VERIFIED WITH: B.GREEN,PHARMD AT 0046 ON 04/27/18 BY G.MCADOO    Culture (A)  Final    STAPHYLOCOCCUS SPECIES (COAGULASE NEGATIVE) THE SIGNIFICANCE OF ISOLATING THIS ORGANISM FROM A SINGLE SET OF BLOOD CULTURES WHEN MULTIPLE SETS ARE DRAWN IS UNCERTAIN. PLEASE NOTIFY THE MICROBIOLOGY DEPARTMENT WITHIN ONE WEEK IF SPECIATION AND SENSITIVITIES ARE REQUIRED. Performed at [facility], [place] [address], [place], [place] [address]    Report Status 04/28/2018 FINAL  Final  Blood Culture ID Panel (Reflexed)     Status: Abnormal   Collection Time: 04/25/18  6:54 PM  Result Value Ref Range Status   Enterococcus species NOT DETECTED NOT DETECTED Final   Listeria monocytogenes NOT DETECTED NOT DETECTED Final   Staphylococcus species DETECTED (A) NOT DETECTED Final    Comment: Methicillin (oxacillin) susceptible coagulase negative staphylococcus. Possible blood culture contaminant (unless isolated from more than one blood culture draw or clinical case suggests pathogenicity). No antibiotic treatment is indicated for blood  culture contaminants. CRITICAL RESULT CALLED TO, READ BACK BY AND  VERIFIED WITH: B.GREEN,PHARMD AT 0046 ON 04/27/18 BY G.MCADOO    Staphylococcus aureus (BCID) NOT DETECTED NOT DETECTED Final   Methicillin resistance NOT DETECTED NOT DETECTED Final   Streptococcus species NOT DETECTED NOT DETECTED Final   Streptococcus agalactiae NOT DETECTED NOT  DETECTED Corrected    Comment: CORRECTED ON 01/26 AT 0102: PREVIOUSLY REPORTED AS NOT DETECTED CRITICAL RESULT CALLED TO, READ BACK BY AND VERIFIED WITH: B.GREEN,PHARMD AT 0046 ON 04/27/18 BY G.MCADOO   Streptococcus pneumoniae NOT DETECTED NOT DETECTED Final   Streptococcus pyogenes NOT DETECTED NOT DETECTED Final   Acinetobacter baumannii NOT DETECTED NOT DETECTED Final   Enterobacteriaceae species NOT DETECTED NOT DETECTED Final   Enterobacter cloacae complex NOT DETECTED NOT DETECTED Final   Escherichia coli NOT DETECTED NOT DETECTED Final   Klebsiella oxytoca NOT DETECTED NOT DETECTED Final   Klebsiella pneumoniae NOT DETECTED NOT DETECTED Final   Proteus species NOT DETECTED NOT DETECTED Final   Serratia marcescens NOT DETECTED NOT DETECTED Final   Haemophilus influenzae NOT DETECTED NOT DETECTED Final   Neisseria meningitidis NOT DETECTED NOT DETECTED Final   Pseudomonas aeruginosa NOT DETECTED NOT DETECTED Final   Candida albicans NOT DETECTED NOT DETECTED Final   Candida glabrata NOT DETECTED NOT DETECTED Final   Candida krusei NOT DETECTED NOT DETECTED Final   Candida parapsilosis NOT DETECTED NOT DETECTED Final   Candida tropicalis NOT DETECTED NOT DETECTED Final    Comment: Performed at [facility], [address], [place], [place] [address]  Culture, blood (routine x 2)     Status: None   Collection Time: 04/25/18  7:18 PM  Result Value Ref Range Status   Specimen Description   Final    BLOOD RIGHT HAND Performed at [facility], [place] [address], [place], [place] [address]    Special Requests   Final    BOTTLES DRAWN AEROBIC AND ANAEROBIC Blood Culture adequate volume Performed at [facility] [address], [place], [place] [address]    Culture   Final    NO GROWTH 5 DAYS Performed at [facility], [place] [address], [place], [place] [address]    Report Status 05/01/2018 FINAL  Final  Urine culture     Status: Abnormal    Collection Time: 04/25/18  8:52 PM  Result Value Ref Range Status   Specimen Description URINE, RANDOM  Final   Special Requests   Final    NONE Performed at [facility] [address], [place], [place] [address]    Culture >=100,000 COLONIES/mL ESCHERICHIA COLI (A)  Final   Report Status 04/28/2018 FINAL  Final   Organism ID, Bacteria ESCHERICHIA COLI (A)  Final      Susceptibility   Escherichia coli - MIC*    AMPICILLIN <=2 SENSITIVE Sensitive     CEFAZOLIN <=4 SENSITIVE Sensitive     CEFTRIAXONE <=1 SENSITIVE Sensitive     CIPROFLOXACIN <=0.25 SENSITIVE Sensitive     GENTAMICIN <=1 SENSITIVE Sensitive     IMIPENEM <=0.25 SENSITIVE Sensitive     NITROFURANTOIN <=16 SENSITIVE Sensitive     TRIMETH/SULFA <=20 SENSITIVE Sensitive     AMPICILLIN/SULBACTAM <=2 SENSITIVE Sensitive     PIP/TAZO <=4 SENSITIVE Sensitive     Extended ESBL NEGATIVE Sensitive     * >=100,000 COLONIES/mL ESCHERICHIA COLI  Respiratory Panel by PCR     Status: Abnormal   Collection Time: 04/26/18 12:28 AM  Result Value Ref Range Status   Adenovirus  NOT DETECTED NOT DETECTED Final   Coronavirus 229E NOT DETECTED NOT DETECTED Final   Coronavirus HKU1 NOT DETECTED NOT DETECTED Final   Coronavirus NL63 NOT DETECTED NOT DETECTED Final   Coronavirus OC43 NOT DETECTED NOT DETECTED Final   Metapneumovirus NOT DETECTED NOT DETECTED Final   Rhinovirus / Enterovirus NOT DETECTED NOT DETECTED Final   Influenza A NOT DETECTED NOT DETECTED Final   Influenza B NOT DETECTED NOT DETECTED Final   Parainfluenza Virus 1 NOT DETECTED NOT DETECTED Final   Parainfluenza Virus 2 NOT DETECTED NOT DETECTED Final   Parainfluenza Virus 3 NOT DETECTED NOT DETECTED Final   Parainfluenza Virus 4 NOT DETECTED NOT DETECTED Final   Respiratory Syncytial Virus DETECTED (A) NOT DETECTED Final    Comment: CRITICAL RESULT CALLED TO, READ BACK BY AND VERIFIED WITH: RN B MAY [id number] MLM    Bordetella pertussis NOT  DETECTED NOT DETECTED Final   Chlamydophila pneumoniae NOT DETECTED NOT DETECTED Final   Mycoplasma pneumoniae NOT DETECTED NOT DETECTED Final    Comment: Performed at [facility], [place] [address] [place], [place] [address]  MRSA PCR Screening     Status: None   Collection Time: 04/26/18  2:05 AM  Result Value Ref Range Status   MRSA by PCR NEGATIVE NEGATIVE Final    Comment:        The GeneXpert MRSA Assay (FDA approved for NASAL specimens only), is one component of a comprehensive MRSA colonization surveillance program. It is not intended to diagnose MRSA infection nor to guide or monitor treatment for MRSA infections. Performed at [facility], [place] [address] [place], [place] [address]      Medications:   . enoxaparin (LOVENOX) injection  40 mg Subcutaneous Q24H  . insulin aspart  0-9 Units Subcutaneous Q4H  . ipratropium-albuterol  3 mL Nebulization TID  . sodium chloride flush  3 mL Intravenous Q12H   Continuous Infusions: . sodium chloride    . ampicillin-sulbactam (UNASYN) IV 1.5 g (05/02/18 0103)     LOS: 1 day   Charlynne Cousins  [facility]  05/02/2018, 7:55 AM

## 2018-05-02 NOTE — Plan of Care (Signed)
Care Plan have been reviewed.  Problem: Clinical Measurements: Hx of hypoxia, shortness of breath, acute respiratory failure. Goal: Respiratory complications will improve Outcome: Progressing: on 3 LPM O2 NCL, unlabored breathing, auscultated left and right lower had crackle and rhonchi, non productive coughing.  Problem: Clinical Measurements: chronic systolic heart failure, significant hypertension Goal: Cardiovascular complication will be avoided Outcome: Progressing; hemodynamic remains stable, no distress.     Problem: Nutrition: malnutrition, status NPO, high risk of aspiration from SLP evaluation, recommended diet dysphagia 1. Goal: Adequate nutrition will be maintained Outcome: Not progressing: awaiting to discuss with family about long term and end of life care. Pt is still NPO. Monitor blood glucose q 4 hr, no hypoglycemia.   Problem: Elimination: both bowel and urine incontinent Goal: Will not experience complications related to bowel motility Outcome: Progressing; on condom external cath, monitor pt has good amount of urine out put.   Problem: Pain Managment:  Goal: General experience of comfort will improve Outcome: Progressing: no pain, rest comfortably. No distress.   Problem: Health Behavior/Discharge Planning: cognitive impairment due to having dementia and Alzheimer's disease. Goal: Ability to manage health-related needs will improve Outcome: Progressing: pt has a Designer, industrial/product on duty for 24/7.  Jaxsyn Azam, BSN,RN,PCCN-CMC

## 2018-05-02 NOTE — Consult Note (Signed)
Consultation Note Date: 05/02/2018   Patient Name: Jose [name]  DOB: 11/29/1930  MRN: [id number]  Age / Sex: 85 y.o., male  PCP: [name], MD Referring Physician: Aileen Fass, Tammi Klippel, MD  Reason for Consultation: Establishing goals of care and Psychosocial/spiritual support  HPI/Patient Profile: 85 y.o. male  with past medical history of advanced dementia, dysphasia, aspiration pneumonia, CHF with an ejection fraction 35 to 40%, hypertension, diabetes, admitted on 05/01/2018 with altered mental status.  Patient has just been [place] to a skilled nursing facility on 04/30/2018 where he again aspirated, his oxygen saturation levels dropped, and he was placed on BiPAP for support.  Patient was admitted earlier in the month with similar symptomology  Consult ordered for end-of-life; goals of care.   Clinical Assessment and Goals of Care: Patient seen, chart reviewed.  Met with patient, patient's wife, his daughter and son.  All are still wrestling with what they feel is a rapid decline over the past 4 weeks.  We spent a lot of time discussing the disease of dementia, pathophysiology particularly patients losing the ability to swallow safely in the setting of dementia.  Discussed aspiration pneumonia and how antibiotics can help in the short run but long-range there is no cure for this.  Patient's wife is struggling with whether abx can help him  live with for another 6 months before he requires hospitalization or is this the beginning of a trend where he is back and forth into the hospital every month.  We discussed full aggressive care as contrasted with symptom focused, comfort based care.  Also discussed and defined terms of full code versus DNR emphasizing that DNR does not mean do not treat but would actually set treatment limits.  Patient is unable to speak for himself at this point secondary to moderately  advanced dementia.  His healthcare proxy would be his wife, Evonte Prestage, [phone].  They are making decisions as a family  His diet has been advanced to a dysphasia 1.  They recognize that he could still aspirate with this diet but wishes to go forward knowing the risks.    SUMMARY OF RECOMMENDATIONS   Continue full code by choice\ Prepared family to anticipate this being a decision going forward of full code versus DNR Also began discussions regarding artificial feeding; how permanent feeding tubes are contraindicated in the setting of dementia Family given Hard Choices forLoving People booklet as well as MOST form to use as a template for family discussions Palliative medicine to stay involved while he is in the hospital to continue to monitor and support patient and family Should patient be discharged and further goals of care need to be addressed in the community, those resources can be accessed through either [facility] at [phone] or [facility] at [phone] Code Status/Advance Care Planning:  Full code    Symptom Management:  Continue with antibiotics, oral medications as tolerated  Palliative Prophylaxis:   Aspiration, Bowel Regimen, Delirium Protocol, Eye  Care, Frequent Pain Assessment, Oral Care and Turn Reposition  Additional Recommendations (Limitations, Scope, Preferences):  Full Scope Treatment  Psycho-social/Spiritual:   Desire for further Chaplaincy support:no  Additional Recommendations: Referral to Community Resources   Prognosis:  Unable to determine   Discharge Planning: To Be Determined      Primary Diagnoses: Present on Admission: . Alzheimer disease ([place]) . Oropharyngeal dysphagia . Acute respiratory failure with hypoxia ([place]) . Dysphagia, oropharyngeal   I have reviewed the medical record, interviewed the patient and family, and examined the patient. The following  aspects are pertinent.  Past Medical History:  Diagnosis Date  . Abnormality of gait   . Backache, unspecified   . CHF (congestive heart failure) ([place])   . Colon polyps    adenomatous  . Dementia ([place]) 11/12/2012  . Depression   . Diabetes mellitus without complication ([place])   . Diverticulosis   . Essential and other specified forms of tremor   . Hemorrhoids   . History of bilateral hip replacements 11/12/2012  . Memory loss   . 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 Pain in joint, pelvic region and thigh   . Skin cancer of scalp   . Spinal stenosis, lumbar region, without neurogenic claudication   . Unspecified hereditary and idiopathic peripheral neuropathy    Social History   Socioeconomic History  . Marital status: Married    Spouse name: [name]  . Number of children: 2  . Years of education: Chief Executive Officer  . Highest education level: Not on file  Occupational History  . 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 Financial resource strain: Not hard at all  . Food insecurity:    Worry: Never true    Inability: Never true  . Transportation needs:    Medical: No    Non-medical: No  Tobacco Use  . Smoking status: Former Smoker    Types: Cigarettes  . Smokeless tobacco: Never Used  Substance and Sexual Activity  . Alcohol use: Yes    Alcohol/week: 14.0 standard drinks    Types: 14 Glasses of wine per week    Comment: occas, one glass of wine before dinner,sometimes 1/2 glass  . Drug use: No  . Sexual activity: Never  Lifestyle  . Physical activity:    Days per week: 0 days    Minutes per session: 0 min  . Stress: Only a little  Relationships  . Social connections:    Talks on phone: Never    Gets together: More than three times a week    Attends religious service: Never    Active member of club or organization: No    Attends meetings of clubs or organizations: Never    Relationship  status: Married  Other Topics Concern  . Not on file  Social History Narrative   Patient is right handed and resides in home with wife   Family History  Problem Relation Age of Onset  . Heart failure Mother    Scheduled Meds: . carvedilol  3.125 mg Oral BID WC  . donepezil  23 mg Oral Daily  . dorzolamide-timolol  1 drop Left Eye BID  . enoxaparin (LOVENOX) injection  40 mg Subcutaneous Q24H  . insulin aspart  0-9 Units Subcutaneous Q4H  . insulin glargine  10 Units Subcutaneous QHS  . ipratropium-albuterol  3 mL Nebulization TID   Continuous Infusions: . ampicillin-sulbactam (UNASYN) IV 1.5  g (05/02/18 0851)   PRN Meds:.albuterol, ondansetron **OR** ondansetron (ZOFRAN) IV Medications Prior to Admission:  Prior to Admission medications   Medication Sig Start Date End Date Taking? Authorizing Provider  carvedilol (COREG) 3.125 MG tablet Take 1 tablet (3.125 mg total) by mouth 2 (two) times daily with a meal. 11/30/16  Yes Theodis Blaze, MD  cefdinir (OMNICEF) 300 MG capsule Take 1 capsule (300 mg total) by mouth every 12 (twelve) hours for 2 days. 04/30/18 05/02/18 Yes Roney Jaffe, MD  docusate sodium (COLACE) 100 MG capsule Take 100 mg by mouth daily.   Yes [provider]  donepezil (ARICEPT) 23 MG TABS tablet Take 1 tablet (23 mg total) by mouth daily. 10/14/17  Yes Ward Givens, NP  dorzolamide-timolol (COSOPT) 22.3-6.8 MG/ML ophthalmic solution Place 1 drop into the left eye 2 (two) times daily.  09/20/12  Yes [provider]  Emollient (CERAVE) CREA Apply 1 application topically See admin instructions. Apply thin layer topically to arms, legs, and trunk once daily as needed for skin care   Yes [provider]  ferrous sulfate 325 (65 FE) MG tablet Take 325 mg by mouth daily with breakfast.   Yes [provider]  Flaxseed, Linseed, (FLAXSEED OIL) 1000 MG CAPS Take 1,000 mg by mouth daily.     Yes [provider]  folic acid  (FOLVITE) 616 MCG tablet Take 800 mcg by mouth every evening.    Yes [provider]  guaifenesin (ROBITUSSIN) 100 MG/5ML syrup Take 200 mg by mouth 2 (two) times daily as needed for cough.    Yes [provider]  hydrocortisone (ANUSOL-HC) 2.5 % rectal cream Place 1 application rectally at bedtime as needed for hemorrhoids or anal itching.   Yes [provider]  insulin glargine (LANTUS) 100 UNIT/ML injection Inject 10 Units into the skin at bedtime.    Yes [provider]  metFORMIN (GLUCOPHAGE) 500 MG tablet Take 500 mg by mouth 2 (two) times daily with a meal.   Yes [provider]  Multiple Vitamin (MULTIVITAMIN) tablet Take 1 tablet by mouth daily.     Yes [provider]  nystatin cream (MYCOSTATIN) Apply 1 application topically 2 (two) times daily as needed for dry skin. Apply to redness on  scrotum/sacral areas   Yes [provider]  Probiotic Product (ALIGN) 4 MG CAPS Take 4 mg by mouth daily.   Yes [provider]  simvastatin (ZOCOR) 5 MG tablet Take 5 mg by mouth daily.   Yes [provider]  zinc oxide 20 % ointment Apply 1 application topically 3 (three) times daily.    Yes [provider]  buPROPion (WELLBUTRIN SR) 100 MG 12 hr tablet Take 100 mg by mouth daily. 11/23/16   [provider]  Cholecalciferol (VITAMIN D) 1000 UNITS capsule Take 1,000 Units by mouth daily.      [provider]  furosemide (LASIX) 20 MG tablet Take 1 tablet (20 mg total) by mouth daily. Patient not taking: Reported on 05/01/2018 05/05/18   Roney Jaffe, MD  lisinopril (PRINIVIL,ZESTRIL) 5 MG tablet Take 1 tablet (5 mg total) by mouth daily. Patient not taking: Reported on 05/01/2018 12/01/16   Theodis Blaze, MD  memantine (NAMENDA XR) 28 MG CP24 24 hr capsule Take 1 capsule (28 mg total) by mouth daily. Patient not taking: Reported on 05/01/2018 10/14/17   Ward Givens, NP  sitaGLIPtin (JANUVIA) 25 MG  tablet Take 1 tablet (25 mg total) by mouth  daily. Patient not taking: Reported on 05/01/2018 01/08/18   [name], [name], NP   Allergies  Allergen Reactions  . Axona [Bacid]     Unknown per MAR  . Gabapentin     Hallucinations   Review of Systems  Unable to perform ROS: Dementia    Physical Exam Vitals signs and nursing note reviewed.  Constitutional:      Appearance: He is ill-appearing.  HENT:     Head: Normocephalic and atraumatic.  Neck:     Musculoskeletal: Normal range of motion.  Cardiovascular:     Rate and Rhythm: Rhythm irregular.  Pulmonary:     Effort: Pulmonary effort is normal.     Comments: Moist congested cough noted Skin:    General: Skin is warm and dry.     Coloration: Skin is pale.  Neurological:     Mental Status: He is alert.     Comments: Oriented to himself, recognizes his family; does not possess good understanding of his disease progress Can answer only simple questions yes or no  Psychiatric:     Comments: No overt agitation otherwise unable to test     Vital Signs: BP 123/79 (BP Location: Left Arm)   Pulse 79   Temp 97.8 F (36.6 C) (Oral)   Resp (!) 28   Wt 62.6 kg   SpO2 100%   BMI 18.19 kg/m  Pain Scale: 0-10   Pain Score: 0-No pain   SpO2: SpO2: 100 % O2 Device:SpO2: 100 % O2 Flow Rate: .O2 Flow Rate (L/min): 2 L/min  IO: Intake/output summary:   Intake/Output Summary (Last 24 hours) at 05/02/2018 1303 Last data filed at 05/02/2018 0235 Gross per 24 hour  Intake 400 ml  Output 700 ml  Net -300 ml    LBM: Last BM Date: 04/30/18 Baseline Weight: Weight: 88 kg Most recent weight: Weight: 62.6 kg     Palliative Assessment/Data:   Flowsheet Rows     Most Recent Value  Intake Tab  Referral Department  Hospitalist  Unit at Time of Referral  Med/Surg Unit  Palliative Care Primary Diagnosis  Sepsis/Infectious Disease  Date Notified  05/01/18  Palliative Care Type  New Palliative care  Reason for referral  Clarify  Goals of Care, Psychosocial or Spiritual support  Date of Admission  05/01/18  Date first seen by Palliative Care  05/02/18  # of days Palliative referral response time  1 Day(s)  # of days IP prior to Palliative referral  0  Clinical Assessment  Palliative Performance Scale Score  40%  Pain Max last 24 hours  Not able to report  Pain Min Last 24 hours  Not able to report  Dyspnea Max Last 24 Hours  Not able to report  Dyspnea Min Last 24 hours  Not able to report  Nausea Max Last 24 Hours  Not able to report  Nausea Min Last 24 Hours  Not able to report  Anxiety Max Last 24 Hours  Not able to report  Anxiety Min Last 24 Hours  Not able to report  Other Max Last 24 Hours  Not able to report  Psychosocial & Spiritual Assessment  Palliative Care Outcomes  Patient/Family meeting held?  Yes  Who was at the meeting?  wife, dtr, son  Palliative Care Outcomes  Provided psychosocial or spiritual support  Palliative Care follow-up planned  Yes, Facility      Time In: 1100 Time Out: 1210 Time Total: 70 min Greater than 50%  of  this time was spent counseling and coordinating care related to the above assessment and plan.  Signed by: Dory Horn, NP   Please contact Palliative Medicine Team phone at [phone] for questions and concerns.  For individual provider: See Shea Evans

## 2018-05-03 LAB — GLUCOSE, CAPILLARY
GLUCOSE-CAPILLARY: 110 mg/dL — AB (ref 70–99)
Glucose-Capillary: 108 mg/dL — ABNORMAL HIGH (ref 70–99)
Glucose-Capillary: 146 mg/dL — ABNORMAL HIGH (ref 70–99)
Glucose-Capillary: 170 mg/dL — ABNORMAL HIGH (ref 70–99)
Glucose-Capillary: 66 mg/dL — ABNORMAL LOW (ref 70–99)
Glucose-Capillary: 75 mg/dL (ref 70–99)
Glucose-Capillary: 92 mg/dL (ref 70–99)
Glucose-Capillary: 98 mg/dL (ref 70–99)

## 2018-05-03 MED ORDER — LISINOPRIL 5 MG PO TABS
5.0000 mg | ORAL_TABLET | Freq: Every day | ORAL | Status: DC
  Administered 2018-05-03 – 2018-05-05 (×3): 5 mg via ORAL
  Filled 2018-05-03 (×3): qty 1

## 2018-05-03 MED ORDER — MEMANTINE HCL ER 28 MG PO CP24
28.0000 mg | ORAL_CAPSULE | Freq: Every day | ORAL | Status: DC
  Administered 2018-05-03 – 2018-05-05 (×3): 28 mg via ORAL
  Filled 2018-05-03 (×3): qty 1

## 2018-05-03 MED ORDER — METFORMIN HCL 500 MG PO TABS
500.0000 mg | ORAL_TABLET | Freq: Two times a day (BID) | ORAL | Status: DC
  Administered 2018-05-03 – 2018-05-05 (×5): 500 mg via ORAL
  Filled 2018-05-03 (×5): qty 1

## 2018-05-03 MED ORDER — FERROUS SULFATE 325 (65 FE) MG PO TABS
325.0000 mg | ORAL_TABLET | Freq: Every day | ORAL | Status: DC
  Administered 2018-05-03 – 2018-05-05 (×3): 325 mg via ORAL
  Filled 2018-05-03 (×3): qty 1

## 2018-05-03 MED ORDER — FUROSEMIDE 20 MG PO TABS
20.0000 mg | ORAL_TABLET | Freq: Every day | ORAL | Status: DC
  Administered 2018-05-03 – 2018-05-05 (×3): 20 mg via ORAL
  Filled 2018-05-03 (×3): qty 1

## 2018-05-03 NOTE — Progress Notes (Signed)
  Patient seen, chart reviewed.  Patient in no acute distress.  He is alert.  He is minimally verbal which appears to be his baseline.  There is no family at the bedside but he does have [facility] sitters around-the-clock.  Sitter this morning states he ate about 80% of his meal without pocketing of food or coughing.  I do still notice a moist cough when I am in the room with him this morning but it does appear less than yesterday.  Information given to family regarding CODE STATUS as well as other goals of care on 05/02/2018.  Family is still trying to come to terms with what feels like a rapid decline to them.  They have elected currently to let him eat for comfort recognizing his risk of aspiration pneumonia.  Spoke to Mrs. Kington.  She would like to speak further with dietary services to have more information as to specific foods, food products that would work with dysphasia 1 diet, nectar thick liquids.  Palliative medicine to continue to work towards finalizing goals of care before discharge but this may continue into outpatient setting.  As noted during prior assessment on 05/02/2018, those resources can be reached through care connection at [phone] or [facility] and [facility] palliative medicine division at [phone]  We will place another speech eval for education only  Thank you, Romona Curls, NP Time spent: 15 minutes Greater than 50% of time spent in counseling and coordination of care

## 2018-05-03 NOTE — Evaluation (Signed)
Clinical/Bedside Swallow Evaluation Patient Details  Name: Jose [name] MRN: [id number] Date of Birth: 03-27-1931  Today's Date: 05/03/2018 Time: SLP Start Time (ACUTE ONLY): 1601 SLP Stop Time (ACUTE ONLY): 1345 SLP Time Calculation (min) (ACUTE ONLY): 40 min  Past Medical History:  Past Medical History:  Diagnosis Date  . Abnormality of gait   . Backache, unspecified   . CHF (congestive heart failure) ([place])   . Colon polyps    adenomatous  . Dementia ([place]) 11/12/2012  . Depression   . Diabetes mellitus without complication ([place])   . Diverticulosis   . Essential and other specified forms of tremor   . Hemorrhoids   . History of bilateral hip replacements 11/12/2012  . Memory loss   . 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 Pain in joint, pelvic region and thigh   . Skin cancer of scalp   . Spinal stenosis, lumbar region, without neurogenic claudication   . Unspecified hereditary and idiopathic peripheral neuropathy    Past Surgical History:  Past Surgical History:  Procedure Laterality Date  . CATARACT EXTRACTION, BILATERAL  2012  . JOINT REPLACEMENT    . RETINAL LASER PROCEDURE  2012   retinal wrinkle  . SKIN CANCER EXCISION    . TOTAL HIP ARTHROPLASTY Left 2000  . TOTAL HIP ARTHROPLASTY (aka REPLACEMENT) Right 2011   HPI:  85 y.o. male  with past medical history of advanced dementia, dysphasia, aspiration pneumonia, CHF with an ejection fraction 35 to 40%, hypertension, diabetes, admitted on 05/01/2018 with altered mental status.  Patient has just been [place] to a skilled nursing facility on 04/30/2018 where he again aspirated, his oxygen saturation levels dropped, and he was placed on BiPAP for support.  Patient was admitted earlier in the month with similar symptomology. MBS 04/29/18 with recommendation for regular/thin with small bolus sizes, self-administered if possible, with second swallow to clear residue. Meds whole in puree. Assistance  recommended to ensure second swallow and small bolus size.   Assessment / Plan / Recommendation Clinical Impression   Pt seen for swallow evaluation at request of palliative medicine as family questioning diet recommendations and strategies. Note SLP recommendation last admission was for regular diet, thin liquids with compensations; pt has been on puree/nectar this admission per MD. SLP spend an extended amount of time educating pt's wife and daughter re: MBS performed last admission and reinforcing that compensatory strategies for feeding vs texture modifications are what improve pt's swallowing safety. Daughter explained that prior to d/c from [place] last week, pt ate dinner quickly and was immediately reclined for ambulance ride/transport to SNF. Per daughter pt did not eat dinner at SNF but developed respiratory difficulties while reclined in bed. SLP assessed pt's swallowing with thin liquids, puree and regular solids. When left to feed himself without assistance, pt took larger bites, resulting in immediate cough x1 with puree. Pt also easily distracted by conversation/commotion in room, requiring verbal and sometimes tactile cues to initiate swallow. With minimal distractions and assistance for bolus size/rate control, airway protection appears adequate. Wife states that when pt feeds himself he often pockets food. SLP provided written and verbal education re: precautions. Recommend regular diet, thin liquids, meds whole in puree. Full supervision/assistance for small bites, second swallow to clear residue. Recommend pt remain upright 30 min after eating; check for pocketing between bites and after meal. Will follow briefly for caregiver education and carryover of strategies.    SLP Visit Diagnosis: Dysphagia, oropharyngeal  phase (R13.12)    Aspiration Risk  Moderate aspiration risk    Diet Recommendation Regular;Thin liquid   Liquid Administration via: Cup Medication Administration:  Whole meds with puree Supervision: Full supervision/cueing for compensatory strategies;Patient able to self feed Compensations: Minimize environmental distractions;Small sips/bites;Slow rate;Multiple dry swallows after each bite/sip    Other  Recommendations Oral Care Recommendations: Oral care before and after PO   Follow up Recommendations 24 hour supervision/assistance;Skilled Nursing facility      Frequency and Duration min 1 x/week  1 week       Prognosis Prognosis for Safe Diet Advancement: Good Barriers to Reach Goals: Cognitive deficits      Swallow Study   General Date of Onset: 05/01/18 HPI: 85 y.o. male  with past medical history of advanced dementia, dysphasia, aspiration pneumonia, CHF with an ejection fraction 35 to 40%, hypertension, diabetes, admitted on 05/01/2018 with altered mental status.  Patient has just been [place] to a skilled nursing facility on 04/30/2018 where he again aspirated, his oxygen saturation levels dropped, and he was placed on BiPAP for support.  Patient was admitted earlier in the month with similar symptomology. MBS 04/29/18 with recommendation for regular/thin with small bolus sizes, self-administered if possible, with second swallow to clear residue. Meds whole in puree. Assistance recommended to ensure second swallow and small bolus size. Type of Study: Bedside Swallow Evaluation Previous Swallow Assessment: see HPI Diet Prior to this Study: Dysphagia 1 (puree);Nectar-thick liquids Temperature Spikes Noted: No Respiratory Status: Nasal cannula History of Recent Intubation: No Behavior/Cognition: Alert;Cooperative Oral Cavity Assessment: Within Functional Limits Oral Care Completed by SLP: No Oral Cavity - Dentition: Adequate natural dentition Vision: Functional for self-feeding Self-Feeding Abilities: Able to feed self;Needs assist Patient Positioning: Upright in bed Baseline Vocal Quality: Normal Volitional Cough: Strong Volitional Swallow:  Unable to elicit    Oral/Motor/Sensory Function Overall Oral Motor/Sensory Function: Within functional limits   Ice Chips Ice chips: Within functional limits   Thin Liquid Thin Liquid: Within functional limits Presentation: Cup;Self Fed    Nectar Thick Nectar Thick Liquid: Not tested   Honey Thick Honey Thick Liquid: Not tested   Puree Puree: Impaired Presentation: Self Fed;Spoon Oral Phase Impairments: Poor awareness of bolus Pharyngeal Phase Impairments: Suspected delayed Swallow;Cough - Immediate(easily distracted)   Solid     Solid: Within functional limits Presentation: Self Fed(preportioned bite size)     Deneise Lever, MS, [place] Pager: [phone] Office: [phone]  Aliene Altes 05/03/2018,3:56 PM

## 2018-05-03 NOTE — Progress Notes (Signed)
Hypoglycemic Event  CBG: 66  Treatment: 4 oz juice/soda  Symptoms: None  Follow-up CBG: Time:2157 CBG Result:75  Possible Reasons for Event: Unknown  Comments/MD notified:[place]     Phineas Semen

## 2018-05-03 NOTE — Progress Notes (Addendum)
[facility] PROGRESS NOTE    Progress Note  Jose [name]  IEP:[id number] DOB: 1930/12/24 DOA: 05/01/2018 PCP: [name], MD     Brief Narrative:   Jose [name] is an 85 y.o. male past medical history significant for dysphasia, colic heart failure with an EF of 35% on April 26, 2018 with very mild aortic stenosis during this echo, on admission had aspiration pneumonia from [facility] long hospital, primary relates that when he went home he ate and started coughing and choking.  He became very short of breath EMS was called saturations were low he was placed on BiPAP.  Assessment/Plan:   Acute respiratory failure with hypoxia (HCC) in the setting of aspiration pneumonia: He was evaluated by speech that showed oropharyngeal dysphagia with laryngeal penetration and aspiration with significant residual they recommended a dysphagia 1 diet no straws. He was placed on BiPAP started empirically on IV Unasyn. Satting 90% off BiPAP, on nasal canula, will cont to wean of oxygen non-tachycardic blood pressure stable. Awaiting hospice and palliative care meeting. Allow diet. Discontinue telemetry can be transferred to [place].  Dysphagia: Had a previous swallowing evaluation when he was at [facility] long, recommendations Dys 1 diet with thin liquids.  Chronic systolic heart failure: Medications were held due to hypotensive episode in the ED. Blood pressure is stable. Start her Coreg continue to hold her ACE inhibitor.  Alzheimer disease (HCC)/dementia without behavioral disturbances.: Continue current meds. Episodes of agitation.  Essential hypertension: Pressure is trending up we will continue Coreg, resume ACE inhibitor and Lasix.  Insulin-dependent diabetes mellitus type 2: Discontinue long-acting insulin he is having a regular diet, resume his metformin.  Continue sliding scale.  Acute encephalopathy: Likely due to hypoxic and aspiration episode as per caregiver now  improved.  Severe protein caloric malnutrition:  DVT prophylaxis: lovenox Family Communication:daughter Disposition Plan/Barrier to D/C: unable to determine Code Status:     Code Status Orders  (From admission, onward)         Start     Ordered   05/01/18 0246  Full code  Continuous     05/01/18 0248        Code Status History    Date Active Date Inactive Code Status Order ID Comments User Context   04/26/2018 0008 04/30/2018 2213 Full Code [id number]  Milagros Loll, MD ED   03/29/2017 1426 04/01/2017 1658 Full Code [id number]  Rondel Jumbo, PA-C ED   11/28/2016 2051 11/30/2016 1812 Full Code [id number]  Charlynne Cousins, MD ED   06/22/2016 1523 06/23/2016 2135 Full Code [id number]  Caren Griffins, MD Inpatient        IV Access:    Peripheral IV   Procedures and diagnostic studies:   No results found.   Medical Consultants:    None.  Anti-Infectives:   IV Unasyn  Subjective:    Jose [name] more awake today able to carry on a conversation.  Objective:    Vitals:   05/02/18 2015 05/02/18 2300 05/03/18 0036 05/03/18 0503  BP: 124/80 120/70  (!) 157/86  Pulse: 77 69 72 (!) 106  Resp: 17 (!) 23  (!) 21  Temp: 97.6 F (36.4 C) (!) 97.5 F (36.4 C)  97.8 F (36.6 C)  TempSrc: Oral Oral  Oral  SpO2: 96% 97% 97% 95%  Weight:   76.7 kg 76.7 kg    Intake/Output Summary (Last 24 hours) at 05/03/2018 0705 Last data filed at 05/03/2018 0523 Gross per 24 hour  Intake 340 ml  Output 1150 ml  Net -810 ml   Filed Weights   05/02/18 0636 05/03/18 0036 05/03/18 0503  Weight: 62.6 kg 76.7 kg 76.7 kg    Exam: General exam: In no acute distress cachectic Respiratory system: Good air movement and clear to auscultation. Cardiovascular system: Rate and rhythm with positive S1-S2 no murmurs rubs gallops. Gastrointestinal system: Soft nontender nondistended Central nervous system: Awake alert moving all 4 extremities without any difficulties, external  ocular movements are intact. Extremities: No pedal edema. Skin: No rashes, lesions or ulcers    Data Reviewed:    Labs: Basic Metabolic Panel: Recent Labs  Lab 04/27/18 1122 04/29/18 0414 04/30/18 0534 05/01/18 0033 05/01/18 0034 05/01/18 0607  NA 148* 144 145 143 141 144  K 3.6 3.5 3.6 3.8 4.0 4.1  CL 116* 114* 118*  --  111 111  CO2 25 23 23   --  21* 24  GLUCOSE 237* 142* 133*  --  224* 163*  BUN 23 24* 16  --  15 15  CREATININE 1.15 1.09 0.95  --  1.15 1.15  CALCIUM 8.4* 8.1* 8.3*  --  8.2* 8.0*   GFR Estimated Creatinine Clearance: 50.9 mL/min (by C-G formula based on SCr of 1.15 mg/dL). Liver Function Tests: Recent Labs  Lab 04/27/18 0321 05/01/18 0034  AST 44* 47*  ALT 40 60*  ALKPHOS 91 111  BILITOT 0.5 0.6  PROT 5.5* 6.2*  ALBUMIN 2.5* 2.6*   No results for input(s): LIPASE, AMYLASE in the last 168 hours. No results for input(s): AMMONIA in the last 168 hours. Coagulation profile No results for input(s): INR, PROTIME in the last 168 hours.  CBC: Recent Labs  Lab 04/27/18 0321 04/27/18 1122 04/30/18 0534 05/01/18 0033 05/01/18 0034 05/01/18 0607  WBC 5.4 5.9 8.8  --  13.1* 10.9*  NEUTROABS 3.7 4.5  --   --  10.9*  --   HGB 8.1* 8.2* 8.2* 9.2* 9.4* 9.4*  HCT 28.5* 29.1* 28.8* 27.0* 32.9* 31.8*  MCV 92.8 94.8 91.1  --  89.9 89.1  PLT 128* 121* 143*  --  185 134*   Cardiac Enzymes: Recent Labs  Lab 04/26/18 1203  TROPONINI 0.06*   BNP (last 3 results) No results for input(s): PROBNP in the last 8760 hours. CBG: Recent Labs  Lab 05/02/18 1212 05/02/18 1559 05/02/18 2037 05/03/18 0042 05/03/18 0456  GLUCAP 172* 200* 105* 108* 98   D-Dimer: No results for input(s): DDIMER in the last 72 hours. Hgb A1c: No results for input(s): HGBA1C in the last 72 hours. Lipid Profile: No results for input(s): CHOL, HDL, LDLCALC, TRIG, CHOLHDL, LDLDIRECT in the last 72 hours. Thyroid function studies: No results for input(s): TSH, T4TOTAL,  T3FREE, THYROIDAB in the last 72 hours.  Invalid input(s): FREET3 Anemia work up: No results for input(s): VITAMINB12, FOLATE, FERRITIN, TIBC, IRON, RETICCTPCT in the last 72 hours. Sepsis Labs: Recent Labs  Lab 04/26/18 1055 04/27/18 0009  04/27/18 0321 04/27/18 1122 04/28/18 0314 04/30/18 0534 05/01/18 0034 05/01/18 0607  PROCALCITON 0.51  --   --  0.45  --  0.28  --   --   --   WBC  --   --    < > 5.4 5.9  --  8.8 13.1* 10.9*  LATICACIDVEN  --  1.0  --  1.2  --   --   --  1.4  --    < > = values in this interval not displayed.  Microbiology Recent Results (from the past 240 hour(s))  Culture, blood (routine x 2)     Status: Abnormal   Collection Time: 04/25/18  6:54 PM  Result Value Ref Range Status   Specimen Description   Final    BLOOD LEFT FOREARM Performed at [facility], [address] [place], [place] [address]    Special Requests   Final    BOTTLES DRAWN AEROBIC AND ANAEROBIC Blood Culture results may not be optimal due to an excessive volume of blood received in culture bottles Performed at [facility] [address], [place], [place] [address]    Culture  Setup Time   Final    GRAM POSITIVE COCCI ANAEROBIC BOTTLE ONLY CRITICAL RESULT CALLED TO, READ BACK BY AND VERIFIED WITH: B.GREEN,PHARMD AT 0046 ON 04/27/18 BY G.MCADOO    Culture (A)  Final    STAPHYLOCOCCUS SPECIES (COAGULASE NEGATIVE) THE SIGNIFICANCE OF ISOLATING THIS ORGANISM FROM A SINGLE SET OF BLOOD CULTURES WHEN MULTIPLE SETS ARE DRAWN IS UNCERTAIN. PLEASE NOTIFY THE MICROBIOLOGY DEPARTMENT WITHIN ONE WEEK IF SPECIATION AND SENSITIVITIES ARE REQUIRED. Performed at [facility], [place] [address], [place], [place] [address]    Report Status 04/28/2018 FINAL  Final  Blood Culture ID Panel (Reflexed)     Status: Abnormal   Collection Time: 04/25/18  6:54 PM  Result Value Ref Range Status   Enterococcus species NOT DETECTED NOT DETECTED Final   Listeria monocytogenes  NOT DETECTED NOT DETECTED Final   Staphylococcus species DETECTED (A) NOT DETECTED Final    Comment: Methicillin (oxacillin) susceptible coagulase negative staphylococcus. Possible blood culture contaminant (unless isolated from more than one blood culture draw or clinical case suggests pathogenicity). No antibiotic treatment is indicated for blood  culture contaminants. CRITICAL RESULT CALLED TO, READ BACK BY AND VERIFIED WITH: B.GREEN,PHARMD AT 0046 ON 04/27/18 BY G.MCADOO    Staphylococcus aureus (BCID) NOT DETECTED NOT DETECTED Final   Methicillin resistance NOT DETECTED NOT DETECTED Final   Streptococcus species NOT DETECTED NOT DETECTED Final   Streptococcus agalactiae NOT DETECTED NOT DETECTED Corrected    Comment: CORRECTED ON 01/26 AT 0102: PREVIOUSLY REPORTED AS NOT DETECTED CRITICAL RESULT CALLED TO, READ BACK BY AND VERIFIED WITH: B.GREEN,PHARMD AT 0046 ON 04/27/18 BY G.MCADOO   Streptococcus pneumoniae NOT DETECTED NOT DETECTED Final   Streptococcus pyogenes NOT DETECTED NOT DETECTED Final   Acinetobacter baumannii NOT DETECTED NOT DETECTED Final   Enterobacteriaceae species NOT DETECTED NOT DETECTED Final   Enterobacter cloacae complex NOT DETECTED NOT DETECTED Final   Escherichia coli NOT DETECTED NOT DETECTED Final   Klebsiella oxytoca NOT DETECTED NOT DETECTED Final   Klebsiella pneumoniae NOT DETECTED NOT DETECTED Final   Proteus species NOT DETECTED NOT DETECTED Final   Serratia marcescens NOT DETECTED NOT DETECTED Final   Haemophilus influenzae NOT DETECTED NOT DETECTED Final   Neisseria meningitidis NOT DETECTED NOT DETECTED Final   Pseudomonas aeruginosa NOT DETECTED NOT DETECTED Final   Candida albicans NOT DETECTED NOT DETECTED Final   Candida glabrata NOT DETECTED NOT DETECTED Final   Candida krusei NOT DETECTED NOT DETECTED Final   Candida parapsilosis NOT DETECTED NOT DETECTED Final   Candida tropicalis NOT DETECTED NOT DETECTED Final    Comment: Performed at  [facility], [place] [address] [place], [place] [address]  Culture, blood (routine x 2)     Status: None   Collection Time: 04/25/18  7:18 PM  Result Value Ref Range Status  Specimen Description   Final    BLOOD RIGHT HAND Performed at [facility], [place] [address] [place], [place] [address]    Special Requests   Final    BOTTLES DRAWN AEROBIC AND ANAEROBIC Blood Culture adequate volume Performed at [facility] [address] [place], [place] [address]    Culture   Final    NO GROWTH 5 DAYS Performed at [facility], [place] [address], [place], [place] [address]    Report Status 05/01/2018 FINAL  Final  Urine culture     Status: Abnormal   Collection Time: 04/25/18  8:52 PM  Result Value Ref Range Status   Specimen Description URINE, RANDOM  Final   Special Requests   Final    NONE Performed at [facility] [address], [place], [place] [address]    Culture >=100,000 COLONIES/mL ESCHERICHIA COLI (A)  Final   Report Status 04/28/2018 FINAL  Final   Organism ID, Bacteria ESCHERICHIA COLI (A)  Final      Susceptibility   Escherichia coli - MIC*    AMPICILLIN <=2 SENSITIVE Sensitive     CEFAZOLIN <=4 SENSITIVE Sensitive     CEFTRIAXONE <=1 SENSITIVE Sensitive     CIPROFLOXACIN <=0.25 SENSITIVE Sensitive     GENTAMICIN <=1 SENSITIVE Sensitive     IMIPENEM <=0.25 SENSITIVE Sensitive     NITROFURANTOIN <=16 SENSITIVE Sensitive     TRIMETH/SULFA <=20 SENSITIVE Sensitive     AMPICILLIN/SULBACTAM <=2 SENSITIVE Sensitive     PIP/TAZO <=4 SENSITIVE Sensitive     Extended ESBL NEGATIVE Sensitive     * >=100,000 COLONIES/mL ESCHERICHIA COLI  Respiratory Panel by PCR     Status: Abnormal   Collection Time: 04/26/18 12:28 AM  Result Value Ref Range Status   Adenovirus NOT DETECTED NOT DETECTED Final   Coronavirus 229E NOT DETECTED NOT DETECTED Final   Coronavirus HKU1 NOT DETECTED NOT DETECTED Final   Coronavirus NL63 NOT  DETECTED NOT DETECTED Final   Coronavirus OC43 NOT DETECTED NOT DETECTED Final   Metapneumovirus NOT DETECTED NOT DETECTED Final   Rhinovirus / Enterovirus NOT DETECTED NOT DETECTED Final   Influenza A NOT DETECTED NOT DETECTED Final   Influenza B NOT DETECTED NOT DETECTED Final   Parainfluenza Virus 1 NOT DETECTED NOT DETECTED Final   Parainfluenza Virus 2 NOT DETECTED NOT DETECTED Final   Parainfluenza Virus 3 NOT DETECTED NOT DETECTED Final   Parainfluenza Virus 4 NOT DETECTED NOT DETECTED Final   Respiratory Syncytial Virus DETECTED (A) NOT DETECTED Final    Comment: CRITICAL RESULT CALLED TO, READ BACK BY AND VERIFIED WITH: RN B MAY [id number] MLM    Bordetella pertussis NOT DETECTED NOT DETECTED Final   Chlamydophila pneumoniae NOT DETECTED NOT DETECTED Final   Mycoplasma pneumoniae NOT DETECTED NOT DETECTED Final    Comment: Performed at [facility], [place] [address], [place], [place] [address]  MRSA PCR Screening     Status: None   Collection Time: 04/26/18  2:05 AM  Result Value Ref Range Status   MRSA by PCR NEGATIVE NEGATIVE Final    Comment:        The GeneXpert MRSA Assay (FDA approved for NASAL specimens only), is one component of a comprehensive MRSA colonization surveillance program. It is not intended to diagnose MRSA infection nor to guide or monitor treatment for MRSA infections. Performed at [facility], [place] [address] [place], [place] [address]  Blood culture (routine x 2)     Status: None (Preliminary result)   Collection Time: 05/01/18 12:22 AM  Result Value Ref Range Status   Specimen Description BLOOD LEFT HAND  Final   Special Requests   Final    BOTTLES DRAWN AEROBIC AND ANAEROBIC Blood Culture adequate volume   Culture   Final    NO GROWTH 1 DAY Performed at [facility], [place] [address], [place], [place] [address]    Report Status PENDING  Incomplete  Blood culture (routine x 2)     Status: None (Preliminary  result)   Collection Time: 05/01/18 12:47 AM  Result Value Ref Range Status   Specimen Description BLOOD RIGHT HAND  Final   Special Requests   Final    BOTTLES DRAWN AEROBIC AND ANAEROBIC Blood Culture adequate volume   Culture   Final    NO GROWTH 1 DAY Performed at [facility], [place] [address] [place], [place] [address]    Report Status PENDING  Incomplete     Medications:   . carvedilol  3.125 mg Oral BID WC  . donepezil  23 mg Oral Daily  . dorzolamide-timolol  1 drop Left Eye BID  . enoxaparin (LOVENOX) injection  40 mg Subcutaneous Q24H  . insulin aspart  0-9 Units Subcutaneous Q4H  . insulin glargine  10 Units Subcutaneous QHS   Continuous Infusions: . ampicillin-sulbactam (UNASYN) IV 1.5 g (05/03/18 0249)     LOS: 2 days   Charlynne Cousins  [facility]  05/03/2018, 7:05 AM

## 2018-05-04 LAB — GLUCOSE, CAPILLARY
GLUCOSE-CAPILLARY: 185 mg/dL — AB (ref 70–99)
Glucose-Capillary: 113 mg/dL — ABNORMAL HIGH (ref 70–99)
Glucose-Capillary: 117 mg/dL — ABNORMAL HIGH (ref 70–99)
Glucose-Capillary: 152 mg/dL — ABNORMAL HIGH (ref 70–99)
Glucose-Capillary: 202 mg/dL — ABNORMAL HIGH (ref 70–99)
Glucose-Capillary: 89 mg/dL (ref 70–99)

## 2018-05-04 MED ORDER — BUPROPION HCL ER (SR) 100 MG PO TB12
100.0000 mg | ORAL_TABLET | Freq: Every day | ORAL | Status: DC
  Administered 2018-05-04 – 2018-05-05 (×2): 100 mg via ORAL
  Filled 2018-05-04 (×3): qty 1

## 2018-05-04 MED ORDER — SIMVASTATIN 5 MG PO TABS
5.0000 mg | ORAL_TABLET | Freq: Every day | ORAL | Status: DC
  Administered 2018-05-04 – 2018-05-05 (×2): 5 mg via ORAL
  Filled 2018-05-04 (×3): qty 1

## 2018-05-04 MED ORDER — LINAGLIPTIN 5 MG PO TABS
5.0000 mg | ORAL_TABLET | Freq: Every day | ORAL | Status: DC
  Administered 2018-05-04 – 2018-05-05 (×2): 5 mg via ORAL
  Filled 2018-05-04 (×2): qty 1

## 2018-05-04 MED ORDER — DOCUSATE SODIUM 100 MG PO CAPS
100.0000 mg | ORAL_CAPSULE | Freq: Every day | ORAL | Status: DC
  Administered 2018-05-05: 100 mg via ORAL
  Filled 2018-05-04: qty 1

## 2018-05-04 MED ORDER — AMOXICILLIN-POT CLAVULANATE 875-125 MG PO TABS
1.0000 | ORAL_TABLET | Freq: Two times a day (BID) | ORAL | Status: DC
  Administered 2018-05-04 – 2018-05-05 (×3): 1 via ORAL
  Filled 2018-05-04 (×3): qty 1

## 2018-05-04 NOTE — NC FL2 (Signed)
[place] LEVEL OF CARE SCREENING TOOL     IDENTIFICATION  Patient Name: Jose [name] Birthdate: February 18, 1931 Sex: male Admission Date (Current Location): 05/01/2018  [facility] and [place] Number:  Herbalist and Address:  [facility] [place], [place] [address], [place], [place] [address]      Provider Number: [id number]  Attending Physician Name and Address:  Charlynne Cousins, MD  Relative Name and Phone Number:  Otha Monical, [phone]    Current Level of Care: Hospital Recommended Level of Care: [place] Prior Approval Number:    Date Approved/Denied:   PASRR Number: [id number]  Discharge Plan: SNF    Current Diagnoses: Patient Active Problem List   Diagnosis Date Noted  . Goals of care, counseling/discussion   . Palliative care by specialist   . CHF (congestive heart failure) ([place]) 05/01/2018  . Acute respiratory failure with hypoxia ([place]) 05/01/2018  . Dementia without behavioral disturbance ([place]) 04/30/2018  . Dysphagia, oropharyngeal 04/30/2018  . Cough 04/22/2018  . Constipation 04/15/2018  . HTN (hypertension) 04/15/2018  . Iron deficiency anemia 04/08/2018  . Actinic keratosis 06/21/2017  . Moderate protein-calorie malnutrition ([place]) 03/06/2017  . Unsteady gait 03/06/2017  . Urinary incontinence 12/31/2016  . Oropharyngeal dysphagia 12/31/2016  . Dermatitis, seborrheic 12/14/2016  . Edema 12/14/2016  . Hyperlipidemia associated with type 2 diabetes mellitus ([place]) 12/06/2016  . Functional urinary incontinence 12/06/2016  . Chronic bilateral low back pain with bilateral sciatica 12/06/2016  . Chronic combined systolic and diastolic congestive heart failure ([place]) 12/06/2016  . Major depression, recurrent, chronic ([place]) 11/23/2016  . OAB (overactive bladder) 11/23/2016  . Dermatitis 08/23/2016  . External hemorrhoid 07/26/2016  . Depression with anxiety 07/09/2016  . Thrombocytopenia ([place]) 06/28/2016   . Urinary frequency 06/25/2016  . Closed left hip fracture, sequela 06/22/2016  . Type 2 diabetes mellitus with diabetic chronic kidney disease ([place]) 06/22/2016  . Hyperlipidemia LDL goal <70 06/22/2016  . Alzheimer disease ([place]) 11/12/2012  . History of bilateral hip replacements 11/12/2012    Orientation RESPIRATION BLADDER Height & Weight        O2 Incontinent Weight: 171 lb 8.3 oz (77.8 kg) Height:     BEHAVIORAL SYMPTOMS/MOOD NEUROLOGICAL BOWEL NUTRITION STATUS      Incontinent Diet(See dc summary)  AMBULATORY STATUS COMMUNICATION OF NEEDS Skin   Extensive Assist Verbally                         Personal Care Assistance Level of Assistance  Bathing, Feeding, Dressing Bathing Assistance: Maximum assistance Feeding assistance: Independent Dressing Assistance: Maximum assistance     Functional Limitations Info  Sight, Hearing, Speech Sight Info: Adequate Hearing Info: Adequate Speech Info: Adequate    SPECIAL CARE FACTORS FREQUENCY        PT Frequency: 5x/week OT Frequency: 5x/week            Contractures Contractures Info: Not present    Additional Factors Info  Code Status, Allergies Code Status Info: Full Allergies Info:  Axona Bacid, Gabapentin           Current Medications (05/04/2018):  This is the current hospital active medication list Current Facility-Administered Medications  Medication Dose Route Frequency Provider Last Rate Last Dose  . albuterol (PROVENTIL) (2.5 MG/3ML) 0.083% nebulizer solution 2.5 mg  2.5 mg Nebulization Q2H PRN Phillips Grout, MD      . amoxicillin-clavulanate (AUGMENTIN) 875-125 MG per tablet 1 tablet  1 tablet Oral Q12H Charlynne Cousins, MD      . carvedilol (COREG) tablet 3.125 mg  3.125 mg Oral BID WC Charlynne Cousins, MD   3.125 mg at 05/04/18 0820  . donepezil (ARICEPT) tablet 23 mg  23 mg Oral Daily Charlynne Cousins, MD   23 mg at 05/03/18 1004  . dorzolamide-timolol (COSOPT) 22.3-6.8 MG/ML  ophthalmic solution 1 drop  1 drop Left Eye BID Charlynne Cousins, MD   1 drop at 05/03/18 2134  . enoxaparin (LOVENOX) injection 40 mg  40 mg Subcutaneous Q24H Derrill Kay A, MD   40 mg at 05/03/18 1326  . ferrous sulfate tablet 325 mg  325 mg Oral Q breakfast Charlynne Cousins, MD   325 mg at 05/04/18 0820  . furosemide (LASIX) tablet 20 mg  20 mg Oral Daily Charlynne Cousins, MD   20 mg at 05/03/18 1001  . lisinopril (PRINIVIL,ZESTRIL) tablet 5 mg  5 mg Oral Daily Charlynne Cousins, MD   5 mg at 05/03/18 1001  . memantine (NAMENDA XR) 24 hr capsule 28 mg  28 mg Oral Daily Charlynne Cousins, MD   28 mg at 05/03/18 1002  . metFORMIN (GLUCOPHAGE) tablet 500 mg  500 mg Oral BID WC Charlynne Cousins, MD   500 mg at 05/04/18 7530  . ondansetron (ZOFRAN) tablet 4 mg  4 mg Oral Q6H PRN Phillips Grout, MD       Or  . ondansetron (ZOFRAN) injection 4 mg  4 mg Intravenous Q6H PRN Phillips Grout, MD         Discharge Medications: Please see discharge summary for a list of discharge medications.  Relevant Imaging Results:  Relevant Lab Results:   Additional Information SS# [id number]  Servando Snare, LCSW

## 2018-05-04 NOTE — Progress Notes (Signed)
[facility] PROGRESS NOTE    Progress Note  Jose [name]  ZOX:[id number] DOB: Aug 18, 1930 DOA: 05/01/2018 PCP: [name], MD     Brief Narrative:   Jose [name] is an 85 y.o. male past medical history significant for dysphasia, colic heart failure with an EF of 35% on April 26, 2018 with very mild aortic stenosis during this echo, on admission had aspiration pneumonia from [facility] long hospital, primary relates that when he went home he ate and started coughing and choking.  He became very short of breath EMS was called saturations were low he was placed on BiPAP.  Assessment/Plan:   Acute respiratory failure with hypoxia (HCC) in the setting of aspiration pneumonia: He was evaluated by speech that showed oropharyngeal dysphagia with laryngeal penetration and aspiration with significant residual they recommended a dysphagia 1 diet no straws. Transition his IV Unasyn to oral Augmentin. Continue to wean off oxygen can be transferred to [place] unit. Awaiting hospice and palliative care meeting. Diet.  Dysphagia: Had a previous swallowing evaluation when he was at [facility] long, recommendations Dys 1 diet with thin liquids.  Chronic systolic heart failure: Medications were held due to hypotensive episode in the ED. Blood pressure is stable. Cont Coreg, ACE inhibitor and Lasix.  Alzheimer disease (HCC)/dementia without behavioral disturbances.: Continue current meds. No episodes of agitation.  Essential hypertension: Pressure is trending up we will continue Coreg, resume ACE inhibitor and Lasix.  Insulin-dependent diabetes mellitus type 2: Continue metformin, patient is having decreased oral intake.  Discontinue sliding scale insulin. Acute encephalopathy: Likely due to hypoxic and aspiration episode as per caregiver now improved.  Severe protein caloric malnutrition:  DVT prophylaxis: lovenox Family Communication:daughter Disposition Plan/Barrier to D/C: Transfer  to MedSurg, skilled nursing facility in the morning. Code Status:     Code Status Orders  (From admission, onward)         Start     Ordered   05/01/18 0246  Full code  Continuous     05/01/18 0248        Code Status History    Date Active Date Inactive Code Status Order ID Comments User Context   04/26/2018 0008 04/30/2018 2213 Full Code [id number]  Milagros Loll, MD ED   03/29/2017 1426 04/01/2017 1658 Full Code [id number]  Rondel Jumbo, PA-C ED   11/28/2016 2051 11/30/2016 1812 Full Code [id number]  Charlynne Cousins, MD ED   06/22/2016 1523 06/23/2016 2135 Full Code [id number]  Caren Griffins, MD Inpatient        IV Access:    Peripheral IV   Procedures and diagnostic studies:   No results found.   Medical Consultants:    None.  Anti-Infectives:   IV Unasyn  Subjective:    Jose [name] no complaints today.  Objective:    Vitals:   05/03/18 1716 05/03/18 2039 05/04/18 0012 05/04/18 0412  BP: 138/70 133/75 126/66 (!) 141/70  Pulse: 85 71 67 75  Resp: 20 19 17 17   Temp: 98.2 F (36.8 C) 98.1 F (36.7 C) 97.6 F (36.4 C) 97.8 F (36.6 C)  TempSrc: Oral Oral Oral Oral  SpO2: 99% 96% 99% 95%  Weight:    77.8 kg    Intake/Output Summary (Last 24 hours) at 05/04/2018 0728 Last data filed at 05/04/2018 0526 Gross per 24 hour  Intake 240 ml  Output 880 ml  Net -640 ml   Filed Weights   05/03/18 0036 05/03/18 0503 05/04/18 0102  Weight: 76.7 kg 76.7 kg 77.8 kg    Exam: General exam: In no acute distress cachectic Respiratory system: Good air movement and clear to auscultation. Cardiovascular system: Rate and rhythm with positive S1-S2 no murmurs rubs gallops. Gastrointestinal system: Soft nontender nondistended Central nervous system: Awake alert moving all 4 extremities without any difficulties, external ocular movements are intact. Extremities: No pedal edema. Skin: No rashes, lesions or ulcers    Data Reviewed:    Labs: Basic  Metabolic Panel: Recent Labs  Lab 04/27/18 1122 04/29/18 0414 04/30/18 0534 05/01/18 0033 05/01/18 0034 05/01/18 0607  NA 148* 144 145 143 141 144  K 3.6 3.5 3.6 3.8 4.0 4.1  CL 116* 114* 118*  --  111 111  CO2 25 23 23   --  21* 24  GLUCOSE 237* 142* 133*  --  224* 163*  BUN 23 24* 16  --  15 15  CREATININE 1.15 1.09 0.95  --  1.15 1.15  CALCIUM 8.4* 8.1* 8.3*  --  8.2* 8.0*   GFR Estimated Creatinine Clearance: 51.7 mL/min (by C-G formula based on SCr of 1.15 mg/dL). Liver Function Tests: Recent Labs  Lab 05/01/18 0034  AST 47*  ALT 60*  ALKPHOS 111  BILITOT 0.6  PROT 6.2*  ALBUMIN 2.6*   No results for input(s): LIPASE, AMYLASE in the last 168 hours. No results for input(s): AMMONIA in the last 168 hours. Coagulation profile No results for input(s): INR, PROTIME in the last 168 hours.  CBC: Recent Labs  Lab 04/27/18 1122 04/30/18 0534 05/01/18 0033 05/01/18 0034 05/01/18 0607  WBC 5.9 8.8  --  13.1* 10.9*  NEUTROABS 4.5  --   --  10.9*  --   HGB 8.2* 8.2* 9.2* 9.4* 9.4*  HCT 29.1* 28.8* 27.0* 32.9* 31.8*  MCV 94.8 91.1  --  89.9 89.1  PLT 121* 143*  --  185 134*   Cardiac Enzymes: No results for input(s): CKTOTAL, CKMB, CKMBINDEX, TROPONINI in the last 168 hours. BNP (last 3 results) No results for input(s): PROBNP in the last 8760 hours. CBG: Recent Labs  Lab 05/03/18 1608 05/03/18 2042 05/03/18 2157 05/03/18 2353 05/04/18 0411  GLUCAP 170* 66* 75 92 89   D-Dimer: No results for input(s): DDIMER in the last 72 hours. Hgb A1c: No results for input(s): HGBA1C in the last 72 hours. Lipid Profile: No results for input(s): CHOL, HDL, LDLCALC, TRIG, CHOLHDL, LDLDIRECT in the last 72 hours. Thyroid function studies: No results for input(s): TSH, T4TOTAL, T3FREE, THYROIDAB in the last 72 hours.  Invalid input(s): FREET3 Anemia work up: No results for input(s): VITAMINB12, FOLATE, FERRITIN, TIBC, IRON, RETICCTPCT in the last 72 hours. Sepsis  Labs: Recent Labs  Lab 04/27/18 1122 04/28/18 0314 04/30/18 0534 05/01/18 0034 05/01/18 0973  PROCALCITON  --  0.28  --   --   --   WBC 5.9  --  8.8 13.1* 10.9*  LATICACIDVEN  --   --   --  1.4  --    Microbiology Recent Results (from the past 240 hour(s))  Culture, blood (routine x 2)     Status: Abnormal   Collection Time: 04/25/18  6:54 PM  Result Value Ref Range Status   Specimen Description   Final    BLOOD LEFT FOREARM Performed at [facility], [address], [place], [place] [address]    Special Requests   Final    BOTTLES DRAWN AEROBIC AND ANAEROBIC Blood Culture results may not be optimal  due to an excessive volume of blood received in culture bottles Performed at [facility] [address], [place], [place] [address]    Culture  Setup Time   Final    GRAM POSITIVE COCCI ANAEROBIC BOTTLE ONLY CRITICAL RESULT CALLED TO, READ BACK BY AND VERIFIED WITH: B.GREEN,PHARMD AT 0046 ON 04/27/18 BY G.[email or web address]    Culture (A)  Final    STAPHYLOCOCCUS SPECIES (COAGULASE NEGATIVE) THE SIGNIFICANCE OF ISOLATING THIS ORGANISM FROM A SINGLE SET OF BLOOD CULTURES WHEN MULTIPLE SETS ARE DRAWN IS UNCERTAIN. PLEASE NOTIFY THE MICROBIOLOGY DEPARTMENT WITHIN ONE WEEK IF SPECIATION AND SENSITIVITIES ARE REQUIRED. Performed at [facility], [place] [address] [place], [place] [address]    Report Status 04/28/2018 FINAL  Final  Blood Culture ID Panel (Reflexed)     Status: Abnormal   Collection Time: 04/25/18  6:54 PM  Result Value Ref Range Status   Enterococcus species NOT DETECTED NOT DETECTED Final   Listeria monocytogenes NOT DETECTED NOT DETECTED Final   Staphylococcus species DETECTED (A) NOT DETECTED Final    Comment: Methicillin (oxacillin) susceptible coagulase negative staphylococcus. Possible blood culture contaminant (unless isolated from more than one blood culture draw or clinical case suggests pathogenicity). No antibiotic treatment is indicated  for blood  culture contaminants. CRITICAL RESULT CALLED TO, READ BACK BY AND VERIFIED WITH: B.GREEN,PHARMD AT 0046 ON 04/27/18 BY G.MCADOO    Staphylococcus aureus (BCID) NOT DETECTED NOT DETECTED Final   Methicillin resistance NOT DETECTED NOT DETECTED Final   Streptococcus species NOT DETECTED NOT DETECTED Final   Streptococcus agalactiae NOT DETECTED NOT DETECTED Corrected    Comment: CORRECTED ON 01/26 AT 0102: PREVIOUSLY REPORTED AS NOT DETECTED CRITICAL RESULT CALLED TO, READ BACK BY AND VERIFIED WITH: B.GREEN,PHARMD AT 0046 ON 04/27/18 BY G.MCADOO   Streptococcus pneumoniae NOT DETECTED NOT DETECTED Final   Streptococcus pyogenes NOT DETECTED NOT DETECTED Final   Acinetobacter baumannii NOT DETECTED NOT DETECTED Final   Enterobacteriaceae species NOT DETECTED NOT DETECTED Final   Enterobacter cloacae complex NOT DETECTED NOT DETECTED Final   Escherichia coli NOT DETECTED NOT DETECTED Final   Klebsiella oxytoca NOT DETECTED NOT DETECTED Final   Klebsiella pneumoniae NOT DETECTED NOT DETECTED Final   Proteus species NOT DETECTED NOT DETECTED Final   Serratia marcescens NOT DETECTED NOT DETECTED Final   Haemophilus influenzae NOT DETECTED NOT DETECTED Final   Neisseria meningitidis NOT DETECTED NOT DETECTED Final   Pseudomonas aeruginosa NOT DETECTED NOT DETECTED Final   Candida albicans NOT DETECTED NOT DETECTED Final   Candida glabrata NOT DETECTED NOT DETECTED Final   Candida krusei NOT DETECTED NOT DETECTED Final   Candida parapsilosis NOT DETECTED NOT DETECTED Final   Candida tropicalis NOT DETECTED NOT DETECTED Final    Comment: Performed at [facility], [place] [address], [place], [place] [address]  Culture, blood (routine x 2)     Status: None   Collection Time: 04/25/18  7:18 PM  Result Value Ref Range Status   Specimen Description   Final    BLOOD RIGHT HAND Performed at [facility], [place] [address] [place], [place] [address]    Special Requests   Final     BOTTLES DRAWN AEROBIC AND ANAEROBIC Blood Culture adequate volume Performed at [facility] [address] [place], [place] [address]    Culture   Final    NO GROWTH 5 DAYS Performed at [facility], [place] [name] [place], [place]  [address]    Report Status 05/01/2018 FINAL  Final  Urine culture     Status: Abnormal   Collection Time: 04/25/18  8:52 PM  Result Value Ref Range Status   Specimen Description URINE, RANDOM  Final   Special Requests   Final    NONE Performed at [facility] [address], [place], [place] [address]    Culture >=100,000 COLONIES/mL ESCHERICHIA COLI (A)  Final   Report Status 04/28/2018 FINAL  Final   Organism ID, Bacteria ESCHERICHIA COLI (A)  Final      Susceptibility   Escherichia coli - MIC*    AMPICILLIN <=2 SENSITIVE Sensitive     CEFAZOLIN <=4 SENSITIVE Sensitive     CEFTRIAXONE <=1 SENSITIVE Sensitive     CIPROFLOXACIN <=0.25 SENSITIVE Sensitive     GENTAMICIN <=1 SENSITIVE Sensitive     IMIPENEM <=0.25 SENSITIVE Sensitive     NITROFURANTOIN <=16 SENSITIVE Sensitive     TRIMETH/SULFA <=20 SENSITIVE Sensitive     AMPICILLIN/SULBACTAM <=2 SENSITIVE Sensitive     PIP/TAZO <=4 SENSITIVE Sensitive     Extended ESBL NEGATIVE Sensitive     * >=100,000 COLONIES/mL ESCHERICHIA COLI  Respiratory Panel by PCR     Status: Abnormal   Collection Time: 04/26/18 12:28 AM  Result Value Ref Range Status   Adenovirus NOT DETECTED NOT DETECTED Final   Coronavirus 229E NOT DETECTED NOT DETECTED Final   Coronavirus HKU1 NOT DETECTED NOT DETECTED Final   Coronavirus NL63 NOT DETECTED NOT DETECTED Final   Coronavirus OC43 NOT DETECTED NOT DETECTED Final   Metapneumovirus NOT DETECTED NOT DETECTED Final   Rhinovirus / Enterovirus NOT DETECTED NOT DETECTED Final   Influenza A NOT DETECTED NOT DETECTED Final   Influenza B NOT DETECTED NOT DETECTED Final   Parainfluenza Virus 1 NOT DETECTED NOT DETECTED Final    Parainfluenza Virus 2 NOT DETECTED NOT DETECTED Final   Parainfluenza Virus 3 NOT DETECTED NOT DETECTED Final   Parainfluenza Virus 4 NOT DETECTED NOT DETECTED Final   Respiratory Syncytial Virus DETECTED (A) NOT DETECTED Final    Comment: CRITICAL RESULT CALLED TO, READ BACK BY AND VERIFIED WITH: RN B MAY [id number] MLM    Bordetella pertussis NOT DETECTED NOT DETECTED Final   Chlamydophila pneumoniae NOT DETECTED NOT DETECTED Final   Mycoplasma pneumoniae NOT DETECTED NOT DETECTED Final    Comment: Performed at [facility], [place] [address], [place], [place] [address]  MRSA PCR Screening     Status: None   Collection Time: 04/26/18  2:05 AM  Result Value Ref Range Status   MRSA by PCR NEGATIVE NEGATIVE Final    Comment:        The GeneXpert MRSA Assay (FDA approved for NASAL specimens only), is one component of a comprehensive MRSA colonization surveillance program. It is not intended to diagnose MRSA infection nor to guide or monitor treatment for MRSA infections. Performed at [facility], [place] [address] [place], [place] [address]   Blood culture (routine x 2)     Status: None (Preliminary result)   Collection Time: 05/01/18 12:22 AM  Result Value Ref Range Status   Specimen Description BLOOD LEFT HAND  Final   Special Requests   Final    BOTTLES DRAWN AEROBIC AND ANAEROBIC Blood Culture adequate volume Performed at [facility], [place] [address] [place], [place] [address]    Culture NO GROWTH 2 DAYS  Final   Report Status PENDING  Incomplete  Blood culture (routine x 2)     Status: None (Preliminary result)   Collection Time: 05/01/18 12:47 AM  Result Value Ref Range Status   Specimen Description BLOOD RIGHT HAND  Final   Special Requests   Final    BOTTLES DRAWN AEROBIC AND ANAEROBIC Blood Culture adequate volume Performed at [facility], [place] [address], [place], [place] [address]    Culture NO GROWTH 2 DAYS  Final   Report  Status PENDING  Incomplete     Medications:   . carvedilol  3.125 mg Oral BID WC  . donepezil  23 mg Oral Daily  . dorzolamide-timolol  1 drop Left Eye BID  . enoxaparin (LOVENOX) injection  40 mg Subcutaneous Q24H  . ferrous sulfate  325 mg Oral Q breakfast  . furosemide  20 mg Oral Daily  . insulin aspart  0-9 Units Subcutaneous Q4H  . insulin glargine  10 Units Subcutaneous QHS  . lisinopril  5 mg Oral Daily  . memantine  28 mg Oral Daily  . metFORMIN  500 mg Oral BID WC   Continuous Infusions: . ampicillin-sulbactam (UNASYN) IV 1.5 g (05/04/18 0322)     LOS: 3 days   Charlynne Cousins  [facility]  05/04/2018, 7:28 AM

## 2018-05-05 ENCOUNTER — Ambulatory Visit: Payer: Medicare Other | Admitting: Adult Health

## 2018-05-05 LAB — GLUCOSE, CAPILLARY
GLUCOSE-CAPILLARY: 125 mg/dL — AB (ref 70–99)
GLUCOSE-CAPILLARY: 233 mg/dL — AB (ref 70–99)
Glucose-Capillary: 139 mg/dL — ABNORMAL HIGH (ref 70–99)

## 2018-05-05 MED ORDER — AMOXICILLIN-POT CLAVULANATE 875-125 MG PO TABS: 1.0000 | ORAL_TABLET | Freq: Two times a day (BID) | ORAL | 0 refills | Status: AC

## 2018-05-05 NOTE — Progress Notes (Signed)
Report called to [facility]. All questions answered.  AVS  Discharge given to [facility] for transport, and provided to family. IV d/c, tele d/c, CCMD notified.

## 2018-05-05 NOTE — Evaluation (Signed)
Occupational Therapy Evaluation Patient Details Name: Jose [name] MRN: [id number] DOB: 11-22-1930 Today's Date: 05/05/2018    History of Present Illness 85 year old with past medical history relevant for chronic systolic heart failure with EF of 35 to 40% by echo on 04/26/2018, dementia, type 2 diabetes on insulin, hypertension, who presented to the emergency department on 04/25/2018 from skilled nursing facility with altered mental status and acute hypoxic respiratory failure due to RSV subsequently found to have E. coli UTI, aspiration events.   Clinical Impression   Pt was ambulatory prior to his last admission and able to participate in ADL. Pt presents with impaired cognition with minimal verbalization. He is able to self feed with moderate assistance, otherwise requires max to total assist for ADL. Pt needs +2 total assist for bed level mobility and demonstrates poor sitting balance. Recommending return to SNF. Expected discharge today, will defer further OT to SNF.    Follow Up Recommendations  SNF;Supervision/Assistance - 24 hour    Equipment Recommendations       Recommendations for Other Services       Precautions / Restrictions Precautions Precautions: Fall Restrictions Weight Bearing Restrictions: No      Mobility Bed Mobility Overal bed mobility: Needs Assistance Bed Mobility: Rolling;Supine to Sit;Sit to Supine Rolling: +2 for physical assistance;+2 for safety/equipment;Total assist   Supine to sit: +2 for physical assistance;+2 for safety/equipment;Max assist Sit to supine: Total assist;+2 for physical assistance;+2 for safety/equipment   General bed mobility comments: Max assist to move to EOB, patient initiated some LE movement but non substantial mobility. Required +2 physical assist  Transfers Overall transfer level: Needs assistance Equipment used: 2 person hand held assist Transfers: Sit to/from Stand           General transfer comment: attempted  multiple sit <> stands with max to total assist to power up and reach clearance of buttocks from surface. 2 person face to face with chuck pad assist.    Balance Overall balance assessment: Needs assistance Sitting-balance support: Bilateral upper extremity supported;Feet supported Sitting balance-Leahy Scale: Poor   Postural control: Posterior lean   Standing balance-Leahy Scale: Zero                             ADL either performed or assessed with clinical judgement   ADL Overall ADL's : Needs assistance/impaired Eating/Feeding: Moderate assistance;Sitting Eating/Feeding Details (indicate cue type and reason): assist to load spoon, drinks with supervision Grooming: Wash/dry face;Sitting;Total assistance   Upper Body Bathing: Total assistance;Sitting   Lower Body Bathing: Total assistance;Bed level   Upper Body Dressing : Maximal assistance;Sitting   Lower Body Dressing: Total assistance;Bed level       Toileting- Clothing Manipulation and Hygiene: Total assistance;Bed level         General ADL Comments: positioned upright for self feeding     Vision Patient Visual Report: No change from baseline       Perception     Praxis      Pertinent Vitals/Pain Pain Assessment: Faces Faces Pain Scale: No hurt     Hand Dominance Right   Extremity/Trunk Assessment Upper Extremity Assessment Upper Extremity Assessment: Generalized weakness   Lower Extremity Assessment Lower Extremity Assessment: Defer to PT evaluation   Cervical / Trunk Assessment Cervical / Trunk Assessment: Kyphotic(with forward head)   Communication Communication Communication: HOH;Expressive difficulties(minimal verbalizations)   Cognition Arousal/Alertness: Awake/alert Behavior During Therapy: Flat affect Overall Cognitive Status: History of cognitive  impairments - at baseline                                 General Comments: minimally verbal during sesson, does  follow commands inconsistently and nods appropriately   General Comments       Exercises Exercises: (EOB sitting balance in conjunction with functional tasks/adl)   Shoulder Instructions      Home Living Family/patient expects to be discharged to:: Skilled nursing facility                                 Additional Comments: [facility]      Prior Functioning/Environment Level of Independence: Needs assistance  Gait / Transfers Assistance Needed: recently nonambulatory ADL's / Homemaking Assistance Needed: has been self feeding since his last admission, assisted for bathing, dressing, toileting   Comments: has comfort keepers 24/7        OT Problem List: Decreased strength;Impaired balance (sitting and/or standing);Decreased coordination;Decreased cognition;Decreased safety awareness;Impaired UE functional use;Decreased knowledge of use of DME or AE      OT Treatment/Interventions:      OT Goals(Current goals can be found in the care plan section) Acute Rehab OT Goals Patient Stated Goal: to be able to walk again OT Goal Formulation: With family  OT Frequency:     Barriers to D/C:            Co-evaluation PT/OT/SLP Co-Evaluation/Treatment: Yes Reason for Co-Treatment: For patient/therapist safety;Complexity of the patient's impairments (multi-system involvement) PT goals addressed during session: Mobility/safety with mobility;Balance OT goals addressed during session: ADL's and self-care      AM-PAC OT "6 Clicks" Daily Activity     Outcome Measure Help from another person eating meals?: A Lot Help from another person taking care of personal grooming?: Total Help from another person toileting, which includes using toliet, bedpan, or urinal?: Total Help from another person bathing (including washing, rinsing, drying)?: Total Help from another person to put on and taking off regular upper body clothing?: A Lot Help from another person to put on  and taking off regular lower body clothing?: Total 6 Click Score: 8   End of Session Equipment Utilized During Treatment: Gait belt;Oxygen(2L)  Activity Tolerance: Patient tolerated treatment well Patient left: in bed;with call bell/phone within reach;with family/visitor present  OT Visit Diagnosis: Muscle weakness (generalized) (M62.81);Other symptoms and signs involving cognitive function                Time: 8469-6295 OT Time Calculation (min): 21 min Charges:  OT General Charges $OT Visit: 1 Visit OT Evaluation $OT Eval Moderate Complexity: 1 Mod  Nestor Lewandowsky, OTR/L Acute Rehabilitation Services Pager: [phone] Office: [phone]  Malka So 05/05/2018, 12:17 PM

## 2018-05-05 NOTE — Progress Notes (Signed)
Speech Language Pathology Treatment: Dysphagia  Patient Details Name: Jose [name] MRN: [id number] DOB: 1930-06-01 Today's Date: 05/05/2018 Time: 7408-1448 SLP Time Calculation (min) (ACUTE ONLY): 25 min  Assessment / Plan / Recommendation Clinical Impression  Pt seen for follow-up for dysphagia. Wife and caregiver present. Wife asked SLP why pt was coughing with thin liquids this morning. With questioning, SLP discerned that pt drank rapid, sequential boluses. SLP reinforced swallow precautions, explaining that feeding methods vs texture modifications are most important for this pt, who is at risk of aspiration with any consistency due to sensory and motor deficits. SLP demonstrated using appropriate positioning, hand-over-hand assist with pt, to provide tactile cuing and sensory feedback to patient, but assisting with rate control/bolus size. Pt tolerated small sips of thin liquids via cup with no coughing, however when pt distracted (looking up at wife when she spoke) or taking larger sips, cough suggestive of decreased airway protection. Occasionally moderate cues were required for attention to POs, as well as for swallow initiation (tactile/dry spoon cues were minimally effective). As pt's focus waned, and there were prolonged delays in swallow initiation with any consistency, with more frequent coughing. SLP educated feeding pt slowly and only when alert/focused. Also reinforced importance of oral care in decreasing risk for infection; SLP assisted pt with this. He was able to follow commands to rinse and spit x2, but swallowed 3rd sip of water vs rinsing. If oral suction is available at [facility], it may be beneficial to use for oral care with pt, as his ability to follow commands for safe oral hygiene may wax and wane. SLP also provided education re: nature of progressive difficulties with swallowing with dementia, and that swallow function is anticipated to deteriorate with cognitive  decline. Education completed and questions answered to wife's satisfaction. Recommend skilled ST continue for education and to reinforce swallow precautions for this pt.      HPI HPI: 85 y.o. male  with past medical history of advanced dementia, dysphasia, aspiration pneumonia, CHF with an ejection fraction 35 to 40%, hypertension, diabetes, admitted on 05/01/2018 with altered mental status.  Patient has just been [place] to a skilled nursing facility on 04/30/2018 where he again aspirated, his oxygen saturation levels dropped, and he was placed on BiPAP for support.  Patient was admitted earlier in the month with similar symptomology. MBS 04/29/18 with recommendation for regular/thin with small bolus sizes, self-administered if possible, with second swallow to clear residue. Meds whole in puree. Assistance recommended to ensure second swallow and small bolus size.      SLP Plan  Discharge SLP treatment due to (comment)(pt to d/c to [facility], education completed. Recommend skilled ST at SNF)       Recommendations  Diet recommendations: Regular;Dysphagia 3 (mechanical soft);Thin liquid Liquids provided via: No straw;Cup Medication Administration: Whole meds with puree Supervision: Staff to assist with self feeding;Patient able to self feed;Full supervision/cueing for compensatory strategies Compensations: Minimize environmental distractions;Small sips/bites;Slow rate;Multiple dry swallows after each bite/sip Postural Changes and/or Swallow Maneuvers: Out of bed for meals;Upright 30-60 min after meal;Seated upright 90 degrees                Oral Care Recommendations: Oral care before and after PO Follow up Recommendations: 24 hour supervision/assistance;Skilled Nursing facility SLP Visit Diagnosis: Dysphagia, oropharyngeal phase (R13.12) Plan: Discharge SLP treatment due to (comment)(pt to d/c to [facility], education completed)       GO  Deneise Lever,  [place], [place] Speech-Language Pathologist Acute Rehabilitation Services Pager: [phone] Office: [phone]  Aliene Altes 05/05/2018, 3:58 PM

## 2018-05-05 NOTE — Clinical Social Work Placement (Signed)
   CLINICAL SOCIAL WORK PLACEMENT  NOTE  Date:  05/05/2018  Patient Details  Name: Jose [name] MRN: [id number] Date of Birth: 1930-07-30  Clinical Social Work is seeking post-discharge placement for this patient at the [place] level of care (*CSW will initial, date and re-position this form in  chart as items are completed):  Yes   Patient/family provided with [facility] Work Department's list of facilities offering this level of care within the geographic area requested by the patient (or if unable, by the patient's family).  Yes   Patient/family informed of their freedom to choose among providers that offer the needed level of care, that participate in Medicare, Medicaid or managed care program needed by the patient, have an available bed and are willing to accept the patient.  Yes   Patient/family informed of [facility]'s ownership interest in [facility] and [facility], as well as of the fact that they are under no obligation to receive care at these facilities.  PASRR submitted to EDS on       PASRR number received on       Existing PASRR number confirmed on 05/04/18     FL2 transmitted to all facilities in geographic area requested by pt/family on 05/05/18     FL2 transmitted to all facilities within larger geographic area on       Patient informed that his/her managed care company has contracts with or will negotiate with certain facilities, including the following:  [facility]     Yes   Patient/family informed of bed offers received.  Patient chooses bed at [facility]     Physician recommends and patient chooses bed at      Patient to be transferred to [facility] on 05/05/18.  Patient to be transferred to facility by [facility]     Patient family notified on 05/05/18 of transfer.  Name of family member notified:  Quante Pettry, spouse     PHYSICIAN       Additional Comment:     _______________________________________________ Estanislado Emms, LCSW 05/05/2018, 3:15 PM

## 2018-05-05 NOTE — Discharge Summary (Signed)
Physician Discharge Summary  Jose [name] RRN:[id number] DOB: Feb 12, 1931 DOA: 05/01/2018  PCP: [name], MD  Admit date: 05/01/2018 Discharge date: 05/05/2018  Admitted From: SNF Disposition:  SNF  Recommendations for Outpatient Follow-up:  1. Follow up with PCP in 1-2 weeks 2. Please obtain BMP/CBC in one week 3. Hospice and palliative care to follow-up at facility.    Home Health:No Equipment/Devices:none  Discharge Condition:Guarded CODE STATUS: Full Diet recommendation: Dysphagia 1 diet no straws upright position 90 degrees  Brief/Interim Summary: 85 y.o. male past medical history significant for dysphasia, colic heart failure with an EF of 35% on April 26, 2018 with very mild aortic stenosis during this echo, on admission had aspiration pneumonia from [facility], primary relates that when he went home he ate and started coughing and choking.  He became very short of breath EMS was called saturations were low he was placed on BiPAP.  Discharge Diagnoses:  Principal Problem:   Acute respiratory failure with hypoxia (HCC) Active Problems:   Alzheimer disease (HCC)   Oropharyngeal dysphagia   Dysphagia, oropharyngeal   CHF (congestive heart failure) (HCC)   Goals of care, counseling/discussion   Palliative care by specialist  Acute respiratory failure with hypoxia likely due to aspiration pneumonia: He was evaluated by speech on his last admission and show significant laryngeal penetration and aspiration they recommended dysphagia 1 diet with no straws. On admission he was started on IV Unasyn his hypoxia resolved he was weaned off oxygen. Hospice imperative care met with family. Family is unrealistic about expectation. He was changed to oral Augmentin if we continue as an outpatient.  Oropharyngeal dysphagia: Continue dysphagia 1 diet with thin liquids no straws.  Chronic systolic heart failure: No changes made to his medication.  Alzheimer's  disease/dementia without behavioral disturbances: Continue current medication.  Essential hypertension: No changes made to his medication.  Insulin-dependent diabetes mellitus type 2: No changes made to his medication.  Acute toxic encephalopathy: Likely due to aspiration pneumonia this resolved with treatment of the infection.  Severe protein caloric malnutrition: Continue dysphagia 1 diet he did have Ensure at the facility.  Goals of care: The family is unrealistic about expectations they want the patient to continue to be a full code, have explained to the wife that he is going to continue to aspirate on this admission he spent only 1 day at the facility before he started choking, she relates or expects that this could be prevented if we will follow a proper diet.  I have explained to her that the diet does not eliminate the risk of this occurring and it will happen again she did not like my explanation.  Discharge Instructions  Discharge Instructions    Diet - low sodium heart healthy   Complete by:  As directed    Increase activity slowly   Complete by:  As directed      Allergies as of 05/05/2018      Reactions   Axona [bacid]    Unknown per MAR   Gabapentin    Hallucinations      Medication List    STOP taking these medications   cefdinir 300 MG capsule Commonly known as:  OMNICEF     TAKE these medications   ALIGN 4 MG Caps Take 4 mg by mouth daily.   amoxicillin-clavulanate 875-125 MG tablet Commonly known as:  AUGMENTIN Take 1 tablet by mouth every 12 (twelve) hours for 2 days.   buPROPion 100 MG 12 hr  tablet Commonly known as:  WELLBUTRIN SR Take 100 mg by mouth daily.   carvedilol 3.125 MG tablet Commonly known as:  COREG Take 1 tablet (3.125 mg total) by mouth 2 (two) times daily with a meal.   CERAVE Crea Apply 1 application topically See admin instructions. Apply thin layer topically to arms, legs, and trunk once daily as needed for skin care    docusate sodium 100 MG capsule Commonly known as:  COLACE Take 100 mg by mouth daily.   donepezil 23 MG Tabs tablet Commonly known as:  ARICEPT Take 1 tablet (23 mg total) by mouth daily.   dorzolamide-timolol 22.3-6.8 MG/ML ophthalmic solution Commonly known as:  COSOPT Place 1 drop into the left eye 2 (two) times daily.   ferrous sulfate 325 (65 FE) MG tablet Take 325 mg by mouth daily with breakfast.   Flaxseed Oil 1000 MG Caps Take 1,000 mg by mouth daily.   folic acid 194 MCG tablet Commonly known as:  FOLVITE Take 800 mcg by mouth every evening.   furosemide 20 MG tablet Commonly known as:  LASIX Take 1 tablet (20 mg total) by mouth daily.   guaifenesin 100 MG/5ML syrup Commonly known as:  ROBITUSSIN Take 200 mg by mouth 2 (two) times daily as needed for cough.   hydrocortisone 2.5 % rectal cream Commonly known as:  ANUSOL-HC Place 1 application rectally at bedtime as needed for hemorrhoids or anal itching.   insulin glargine 100 UNIT/ML injection Commonly known as:  LANTUS Inject 10 Units into the skin at bedtime.   lisinopril 5 MG tablet Commonly known as:  PRINIVIL,ZESTRIL Take 1 tablet (5 mg total) by mouth daily.   memantine 28 MG Cp24 24 hr capsule Commonly known as:  NAMENDA XR Take 1 capsule (28 mg total) by mouth daily.   metFORMIN 500 MG tablet Commonly known as:  GLUCOPHAGE Take 500 mg by mouth 2 (two) times daily with a meal.   multivitamin tablet Take 1 tablet by mouth daily.   nystatin cream Commonly known as:  MYCOSTATIN Apply 1 application topically 2 (two) times daily as needed for dry skin. Apply to redness on  scrotum/sacral areas   simvastatin 5 MG tablet Commonly known as:  ZOCOR Take 5 mg by mouth daily.   sitaGLIPtin 25 MG tablet Commonly known as:  JANUVIA Take 1 tablet (25 mg total) by mouth daily.   Vitamin D 1000 units capsule Take 1,000 Units by mouth daily.   zinc oxide 20 % ointment Apply 1 application  topically 3 (three) times daily.       Allergies  Allergen Reactions  . Axona [Bacid]     Unknown per MAR  . Gabapentin     Hallucinations    Consultations:  None   Procedures/Studies: Dg Chest Portable 1 View  Result Date: 05/01/2018 CLINICAL DATA:  Shortness of breath. Recent left lower lobe pneumonia. EXAM: PORTABLE CHEST 1 VIEW COMPARISON:  Radiographs 3 days ago 04/28/2018, additional priors. FINDINGS: No significant change in left lung base opacity from prior exam. Unchanged right perihilar atelectasis. Unchanged heart size and mediastinal contours. Unchanged low lung volumes. Mild basilar septal thickening and peribronchial thickening suspicious for mild pulmonary edema. No pneumothorax. IMPRESSION: 1. No significant change in left lung base opacity from prior exam. Unchanged right perihilar atelectasis. 2. Mild basilar septal and peribronchial thickening, suspicious for pulmonary edema. Electronically Signed   By: Keith Rake M.D.   On: 05/01/2018 00:52   Dg Chest [facility]  Result Date: 04/28/2018 CLINICAL DATA:  Respiratory failure. EXAM: PORTABLE CHEST 1 VIEW COMPARISON:  04/25/2018 FINDINGS: The cardiomediastinal silhouette is unchanged with upper limits of normal heart size. There is persistent elevation of the right hemidiaphragm with overall low lung volumes. Retrocardiac opacity in the left lower lobe has decreased from the prior study and likely represents atelectasis. The right lung remains grossly clear. No sizable pleural effusion or pneumothorax is identified. IMPRESSION: Improved left lower lobe aeration. Electronically Signed   By: Logan Bores M.D.   On: 04/28/2018 07:18   Dg Chest Portable 1 View  Result Date: 04/25/2018 CLINICAL DATA:  Worsening productive cough EXAM: PORTABLE CHEST 1 VIEW COMPARISON:  03/30/2017 FINDINGS: Lung volumes are low. Streaky parenchymal opacities are noted in the retrocardiac portion of the chest. Findings or more likely to  represent atelectasis. Pneumonia would be difficult to entirely exclude. Cardiac silhouette is stable and borderline enlarged. There is mild aortic atherosclerosis at the arch. Chronic elevation of the right hemidiaphragm with bibasilar atelectasis is noted. No overt pulmonary edema or pulmonary consolidations are identified. Obscuration of the left lateral costophrenic angle is nonspecific but may represent a small effusion or pleural thickening. IMPRESSION: Streaky opacities in the retrocardiac portion of the chest. Findings are more likely to represent atelectasis. Pneumonia would be difficult to entirely exclude. Slight obscuration of the left lateral costophrenic angle is noted, nonspecific but may represent the presence of a small left effusion or possibly pleural thickening. Low lung volumes without overt pulmonary edema. Electronically Signed   By: Ashley Royalty M.D.   On: 04/25/2018 19:07   Dg Swallowing Func-speech Pathology  Result Date: 04/29/2018 Objective Swallowing Evaluation: Type of Study: MBS-Modified Barium Swallow Study  Patient Details Name: Jose [name] MRN: [id number] Date of Birth: 1930/04/14 Today's Date: 04/29/2018 Time: SLP Start Time (ACUTE ONLY): 1210 -SLP Stop Time (ACUTE ONLY): 1230 SLP Time Calculation (min) (ACUTE ONLY): 20 min Past Medical History: Past Medical History: Diagnosis Date . Abnormality of gait  . Backache, unspecified  . CHF (congestive heart failure) ([place])  . Colon polyps   adenomatous . Dementia ([place]) 11/12/2012 . Depression  . Diabetes mellitus without complication ([place])  . Diverticulosis  . Essential and other specified forms of tremor  . Hemorrhoids  . History of bilateral hip replacements 11/12/2012 . Memory loss  . Other persistent mental disorders due to conditions classified elsewhere  . Pain in joint, pelvic region and thigh  . Skin cancer of scalp  . Spinal stenosis, lumbar region, without neurogenic claudication  . Unspecified hereditary and idiopathic  peripheral neuropathy  Past Surgical History: Past Surgical History: Procedure Laterality Date . CATARACT EXTRACTION, BILATERAL  2012 . JOINT REPLACEMENT   . RETINAL LASER PROCEDURE  2012  retinal wrinkle . SKIN CANCER EXCISION   . TOTAL HIP ARTHROPLASTY Left 2000 . TOTAL HIP ARTHROPLASTY (aka REPLACEMENT) Right 6141 HPI: 85 year old man with history of CHF, dementia, DM, HTN, iron deficiency anemia presenting with altered mental status. Unable to obtain history from patient due to acute encephalopathy and underlying dementia. Marland Kitchen He has been having respiratory infection symptoms for the past 1.5 to 2 weeks, including cough that sounds productive. Patient had MBS 03/2017 with mild oropharyngeal dysphagia and discharged with Dys 1, thin liquid diet.  Subjective: alert, cooperative Assessment / Plan / Recommendation CHL IP CLINICAL IMPRESSIONS 04/29/2018 Clinical Impression Patient has mild oropharyngeal dysphagia. Thin liquids with cup sip were aspirated silently when bolus presented by cup, by examiner (trace). However no penetration  or aspiration were noted when patient administered bolus himself. He had a natural chin tuck, that he initiated after the swallow that did not impact the swallow negatively or positively. All swallows were delayed, initiated at the valleculae with all consistencies and volumes. When patient was administered bolus by examiner, a larger bolus was given and the swallow was initiated at the pyriform sinuses and that is also when the silent aspiration occured. Patient has mild to moderated residue after all bolus types and sizes. Recommend a Regular diet, thin liquids with small bolus sizes. Self administered if possible, followed by a second swallow to attempt to clear residue. Medication whole in puree. Assistence of staff with meals to ensrue second swallow and bolus size/portion. SLP Visit Diagnosis Dysphagia, oropharyngeal phase (R13.12) Attention and concentration deficit following --  Frontal lobe and executive function deficit following -- Impact on safety and function Mild aspiration risk   CHL IP TREATMENT RECOMMENDATION 04/29/2018 Treatment Recommendations Therapy as outlined in treatment plan below   Prognosis 04/29/2018 Prognosis for Safe Diet Advancement Good Barriers to Reach Goals Cognitive deficits Barriers/Prognosis Comment -- CHL IP DIET RECOMMENDATION 04/29/2018 SLP Diet Recommendations Regular solids;Thin liquid Liquid Administration via Cup;No straw Medication Administration Whole meds with puree Compensations Minimize environmental distractions;Small sips/bites;Slow rate;Multiple dry swallows after each bite/sip Postural Changes Remain semi-upright after after feeds/meals (Comment);Seated upright at 90 degrees   CHL IP OTHER RECOMMENDATIONS 04/29/2018 Recommended Consults -- Oral Care Recommendations Oral care BID Other Recommendations --   CHL IP FOLLOW UP RECOMMENDATIONS 03/31/2017 Follow up Recommendations Skilled Nursing facility   [facility] IP FREQUENCY AND DURATION 04/29/2018 Speech Therapy Frequency (ACUTE ONLY) min 2x/week Treatment Duration 2 weeks      CHL IP ORAL PHASE 04/29/2018 Oral Phase WFL Oral - Pudding Teaspoon -- Oral - Pudding Cup -- Oral - Honey Teaspoon -- Oral - Honey Cup -- Oral - Nectar Teaspoon -- Oral - Nectar Cup -- Oral - Nectar Straw -- Oral - Thin Teaspoon -- Oral - Thin Cup -- Oral - Thin Straw -- Oral - Puree -- Oral - Mech Soft -- Oral - Regular -- Oral - Multi-Consistency -- Oral - Pill -- Oral Phase - Comment --  CHL IP PHARYNGEAL PHASE 04/29/2018 Pharyngeal Phase Impaired Pharyngeal- Pudding Teaspoon Delayed swallow initiation-vallecula;Pharyngeal residue - valleculae Pharyngeal -- Pharyngeal- Pudding Cup -- Pharyngeal -- Pharyngeal- Honey Teaspoon -- Pharyngeal -- Pharyngeal- Honey Cup -- Pharyngeal -- Pharyngeal- Nectar Teaspoon -- Pharyngeal -- Pharyngeal- Nectar Cup Delayed swallow initiation-vallecula;Pharyngeal residue - valleculae Pharyngeal --  Pharyngeal- Nectar Straw -- Pharyngeal -- Pharyngeal- Thin Teaspoon -- Pharyngeal -- Pharyngeal- Thin Cup Delayed swallow initiation-pyriform sinuses;Reduced airway/laryngeal closure;Reduced anterior laryngeal mobility;Penetration/Aspiration during swallow;Trace aspiration;Pharyngeal residue - valleculae Pharyngeal Material enters airway, passes BELOW cords without attempt by patient to eject out (silent aspiration) Pharyngeal- Thin Straw -- Pharyngeal -- Pharyngeal- Puree -- Pharyngeal -- Pharyngeal- Mechanical Soft Delayed swallow initiation-vallecula;Reduced laryngeal elevation;Pharyngeal residue - valleculae Pharyngeal -- Pharyngeal- Regular -- Pharyngeal -- Pharyngeal- Multi-consistency -- Pharyngeal -- Pharyngeal- Pill -- Pharyngeal -- Pharyngeal Comment --  CHL IP CERVICAL ESOPHAGEAL PHASE 04/29/2018 Cervical Esophageal Phase WFL Pudding Teaspoon -- Pudding Cup -- Honey Teaspoon -- Honey Cup -- Nectar Teaspoon -- Nectar Cup -- Nectar Straw -- Thin Teaspoon -- Thin Cup -- Thin Straw -- Puree -- Mechanical Soft -- Regular -- Multi-consistency -- Pill -- Cervical Esophageal Comment -- Charlynne Cousins Ward 04/29/2018, 1:00 PM                Subjective: No complains  Discharge Exam: Vitals:   05/04/18 1936 05/05/18 0355  BP: 123/61 106/74  Pulse: 86 (!) 58  Resp: (!) 32 (!) 26  Temp: 98.3 F (36.8 C) (!) 97.3 F (36.3 C)  SpO2: 98% 97%     General: Pt is alert, awake, not in acute distress Cardiovascular: RRR, S1/S2 +, no rubs, no gallops Respiratory: CTA bilaterally, no wheezing, no rhonchi Abdominal: Soft, NT, ND, bowel sounds + Extremities: no edema, no cyanosis    The results of significant diagnostics from this hospitalization (including imaging, microbiology, ancillary and laboratory) are listed below for reference.     Microbiology: Recent Results (from the past 240 hour(s))  Culture, blood (routine x 2)     Status: Abnormal   Collection Time: 04/25/18  6:54 PM  Result Value Ref  Range Status   Specimen Description   Final    BLOOD LEFT FOREARM Performed at [facility], [address] [place], [place] [address]    Special Requests   Final    BOTTLES DRAWN AEROBIC AND ANAEROBIC Blood Culture results may not be optimal due to an excessive volume of blood received in culture bottles Performed at [facility] [address], [place], [place] [address]    Culture  Setup Time   Final    GRAM POSITIVE COCCI ANAEROBIC BOTTLE ONLY CRITICAL RESULT CALLED TO, READ BACK BY AND VERIFIED WITH: B.GREEN,PHARMD AT 0046 ON 04/27/18 BY G.MCADOO    Culture (A)  Final    STAPHYLOCOCCUS SPECIES (COAGULASE NEGATIVE) THE SIGNIFICANCE OF ISOLATING THIS ORGANISM FROM A SINGLE SET OF BLOOD CULTURES WHEN MULTIPLE SETS ARE DRAWN IS UNCERTAIN. PLEASE NOTIFY THE MICROBIOLOGY DEPARTMENT WITHIN ONE WEEK IF SPECIATION AND SENSITIVITIES ARE REQUIRED. Performed at [facility], [place] [address], [place], [place] [address]    Report Status 04/28/2018 FINAL  Final  Blood Culture ID Panel (Reflexed)     Status: Abnormal   Collection Time: 04/25/18  6:54 PM  Result Value Ref Range Status   Enterococcus species NOT DETECTED NOT DETECTED Final   Listeria monocytogenes NOT DETECTED NOT DETECTED Final   Staphylococcus species DETECTED (A) NOT DETECTED Final    Comment: Methicillin (oxacillin) susceptible coagulase negative staphylococcus. Possible blood culture contaminant (unless isolated from more than one blood culture draw or clinical case suggests pathogenicity). No antibiotic treatment is indicated for blood  culture contaminants. CRITICAL RESULT CALLED TO, READ BACK BY AND VERIFIED WITH: B.GREEN,PHARMD AT 0046 ON 04/27/18 BY G.MCADOO    Staphylococcus aureus (BCID) NOT DETECTED NOT DETECTED Final   Methicillin resistance NOT DETECTED NOT DETECTED Final   Streptococcus species NOT DETECTED NOT DETECTED Final   Streptococcus agalactiae NOT DETECTED NOT DETECTED  Corrected    Comment: CORRECTED ON 01/26 AT 0102: PREVIOUSLY REPORTED AS NOT DETECTED CRITICAL RESULT CALLED TO, READ BACK BY AND VERIFIED WITH: B.GREEN,PHARMD AT 0046 ON 04/27/18 BY G.MCADOO   Streptococcus pneumoniae NOT DETECTED NOT DETECTED Final   Streptococcus pyogenes NOT DETECTED NOT DETECTED Final   Acinetobacter baumannii NOT DETECTED NOT DETECTED Final   Enterobacteriaceae species NOT DETECTED NOT DETECTED Final   Enterobacter cloacae complex NOT DETECTED NOT DETECTED Final   Escherichia coli NOT DETECTED NOT DETECTED Final   Klebsiella oxytoca NOT DETECTED NOT DETECTED Final   Klebsiella pneumoniae NOT DETECTED NOT DETECTED Final   Proteus species NOT DETECTED NOT DETECTED Final   Serratia marcescens NOT DETECTED NOT DETECTED Final   Haemophilus influenzae NOT DETECTED NOT DETECTED Final   Neisseria  meningitidis NOT DETECTED NOT DETECTED Final   Pseudomonas aeruginosa NOT DETECTED NOT DETECTED Final   Candida albicans NOT DETECTED NOT DETECTED Final   Candida glabrata NOT DETECTED NOT DETECTED Final   Candida krusei NOT DETECTED NOT DETECTED Final   Candida parapsilosis NOT DETECTED NOT DETECTED Final   Candida tropicalis NOT DETECTED NOT DETECTED Final    Comment: Performed at [facility], [place] [address], [place], [place] [address]  Culture, blood (routine x 2)     Status: None   Collection Time: 04/25/18  7:18 PM  Result Value Ref Range Status   Specimen Description   Final    BLOOD RIGHT HAND Performed at [facility], [place] [address] [place], [place] [address]    Special Requests   Final    BOTTLES DRAWN AEROBIC AND ANAEROBIC Blood Culture adequate volume Performed at [facility] [address] [place], [place] [address]    Culture   Final    NO GROWTH 5 DAYS Performed at [facility], [place] [address] [place], [place] [address]    Report Status 05/01/2018 FINAL  Final  Urine culture     Status: Abnormal   Collection Time:  04/25/18  8:52 PM  Result Value Ref Range Status   Specimen Description URINE, RANDOM  Final   Special Requests   Final    NONE Performed at [facility] [address] [place], [place] [address]    Culture >=100,000 COLONIES/mL ESCHERICHIA COLI (A)  Final   Report Status 04/28/2018 FINAL  Final   Organism ID, Bacteria ESCHERICHIA COLI (A)  Final      Susceptibility   Escherichia coli - MIC*    AMPICILLIN <=2 SENSITIVE Sensitive     CEFAZOLIN <=4 SENSITIVE Sensitive     CEFTRIAXONE <=1 SENSITIVE Sensitive     CIPROFLOXACIN <=0.25 SENSITIVE Sensitive     GENTAMICIN <=1 SENSITIVE Sensitive     IMIPENEM <=0.25 SENSITIVE Sensitive     NITROFURANTOIN <=16 SENSITIVE Sensitive     TRIMETH/SULFA <=20 SENSITIVE Sensitive     AMPICILLIN/SULBACTAM <=2 SENSITIVE Sensitive     PIP/TAZO <=4 SENSITIVE Sensitive     Extended ESBL NEGATIVE Sensitive     * >=100,000 COLONIES/mL ESCHERICHIA COLI  Respiratory Panel by PCR     Status: Abnormal   Collection Time: 04/26/18 12:28 AM  Result Value Ref Range Status   Adenovirus NOT DETECTED NOT DETECTED Final   Coronavirus 229E NOT DETECTED NOT DETECTED Final   Coronavirus HKU1 NOT DETECTED NOT DETECTED Final   Coronavirus NL63 NOT DETECTED NOT DETECTED Final   Coronavirus OC43 NOT DETECTED NOT DETECTED Final   Metapneumovirus NOT DETECTED NOT DETECTED Final   Rhinovirus / Enterovirus NOT DETECTED NOT DETECTED Final   Influenza A NOT DETECTED NOT DETECTED Final   Influenza B NOT DETECTED NOT DETECTED Final   Parainfluenza Virus 1 NOT DETECTED NOT DETECTED Final   Parainfluenza Virus 2 NOT DETECTED NOT DETECTED Final   Parainfluenza Virus 3 NOT DETECTED NOT DETECTED Final   Parainfluenza Virus 4 NOT DETECTED NOT DETECTED Final   Respiratory Syncytial Virus DETECTED (A) NOT DETECTED Final    Comment: CRITICAL RESULT CALLED TO, READ BACK BY AND VERIFIED WITH: RN B MAY [id number] MLM    Bordetella pertussis NOT DETECTED NOT  DETECTED Final   Chlamydophila pneumoniae NOT DETECTED NOT DETECTED Final   Mycoplasma pneumoniae NOT DETECTED NOT DETECTED Final    Comment: Performed at  [facility], [place] [address] [place], [place] [address]  MRSA PCR Screening     Status: None   Collection Time: 04/26/18  2:05 AM  Result Value Ref Range Status   MRSA by PCR NEGATIVE NEGATIVE Final    Comment:        The GeneXpert MRSA Assay (FDA approved for NASAL specimens only), is one component of a comprehensive MRSA colonization surveillance program. It is not intended to diagnose MRSA infection nor to guide or monitor treatment for MRSA infections. Performed at [facility], [place] [address], [place], [place] [address]   Blood culture (routine x 2)     Status: None (Preliminary result)   Collection Time: 05/01/18 12:22 AM  Result Value Ref Range Status   Specimen Description BLOOD LEFT HAND  Final   Special Requests   Final    BOTTLES DRAWN AEROBIC AND ANAEROBIC Blood Culture adequate volume   Culture   Final    NO GROWTH 4 DAYS Performed at [facility], [place] [address] [place], [place] [address]    Report Status PENDING  Incomplete  Blood culture (routine x 2)     Status: None (Preliminary result)   Collection Time: 05/01/18 12:47 AM  Result Value Ref Range Status   Specimen Description BLOOD RIGHT HAND  Final   Special Requests   Final    BOTTLES DRAWN AEROBIC AND ANAEROBIC Blood Culture adequate volume   Culture   Final    NO GROWTH 4 DAYS Performed at [facility], [place] [address], [place], [place] [address]    Report Status PENDING  Incomplete     Labs: BNP (last 3 results) Recent Labs    04/26/18 0316 05/01/18 0034  BNP 293.7* 5,625.6*   Basic Metabolic Panel: Recent Labs  Lab 04/29/18 0414 04/30/18 0534 05/01/18 0033 05/01/18 0034 05/01/18 0607  NA 144 145 143 141 144  K 3.5 3.6 3.8 4.0 4.1  CL 114* 118*  --  111 111  CO2 23 23  --  21* 24  GLUCOSE  142* 133*  --  224* 163*  BUN 24* 16  --  15 15  CREATININE 1.09 0.95  --  1.15 1.15  CALCIUM 8.1* 8.3*  --  8.2* 8.0*   Liver Function Tests: Recent Labs  Lab 05/01/18 0034  AST 47*  ALT 60*  ALKPHOS 111  BILITOT 0.6  PROT 6.2*  ALBUMIN 2.6*   No results for input(s): LIPASE, AMYLASE in the last 168 hours. No results for input(s): AMMONIA in the last 168 hours. CBC: Recent Labs  Lab 04/30/18 0534 05/01/18 0033 05/01/18 0034 05/01/18 0607  WBC 8.8  --  13.1* 10.9*  NEUTROABS  --   --  10.9*  --   HGB 8.2* 9.2* 9.4* 9.4*  HCT 28.8* 27.0* 32.9* 31.8*  MCV 91.1  --  89.9 89.1  PLT 143*  --  185 134*   Cardiac Enzymes: No results for input(s): CKTOTAL, CKMB, CKMBINDEX, TROPONINI in the last 168 hours. BNP: Invalid input(s): POCBNP CBG: Recent Labs  Lab 05/04/18 1630 05/04/18 1939 05/04/18 2357 05/05/18 0405 05/05/18 0802  GLUCAP 113* 185* 152* 125* 139*   D-Dimer No results for input(s): DDIMER in the last 72 hours. Hgb A1c No results for input(s): HGBA1C in the last 72 hours. Lipid Profile No results for input(s): CHOL, HDL, LDLCALC, TRIG, CHOLHDL, LDLDIRECT in the last 72 hours. Thyroid function studies No results for input(s): TSH, T4TOTAL,  T3FREE, THYROIDAB in the last 72 hours.  Invalid input(s): FREET3 Anemia work up No results for input(s): VITAMINB12, FOLATE, FERRITIN, TIBC, IRON, RETICCTPCT in the last 72 hours. Urinalysis    Component Value Date/Time   COLORURINE YELLOW 05/01/2018 1109   APPEARANCEUR CLEAR 05/01/2018 1109   LABSPEC 1.011 05/01/2018 1109   PHURINE 6.0 05/01/2018 1109   GLUCOSEU NEGATIVE 05/01/2018 1109   HGBUR LARGE (A) 05/01/2018 1109   BILIRUBINUR NEGATIVE 05/01/2018 1109   KETONESUR NEGATIVE 05/01/2018 1109   PROTEINUR NEGATIVE 05/01/2018 1109   UROBILINOGEN 0.2 06/24/2014 2112   NITRITE NEGATIVE 05/01/2018 1109   LEUKOCYTESUR NEGATIVE 05/01/2018 1109   Sepsis Labs Invalid input(s): PROCALCITONIN,  WBC,   LACTICIDVEN Microbiology Recent Results (from the past 240 hour(s))  Culture, blood (routine x 2)     Status: Abnormal   Collection Time: 04/25/18  6:54 PM  Result Value Ref Range Status   Specimen Description   Final    BLOOD LEFT FOREARM Performed at [facility], [place] [address] [place], [place] [address]    Special Requests   Final    BOTTLES DRAWN AEROBIC AND ANAEROBIC Blood Culture results may not be optimal due to an excessive volume of blood received in culture bottles Performed at [facility] [address], [place], [place] [address]    Culture  Setup Time   Final    GRAM POSITIVE COCCI ANAEROBIC BOTTLE ONLY CRITICAL RESULT CALLED TO, READ BACK BY AND VERIFIED WITH: B.GREEN,PHARMD AT 0046 ON 04/27/18 BY G.MCADOO    Culture (A)  Final    STAPHYLOCOCCUS SPECIES (COAGULASE NEGATIVE) THE SIGNIFICANCE OF ISOLATING THIS ORGANISM FROM A SINGLE SET OF BLOOD CULTURES WHEN MULTIPLE SETS ARE DRAWN IS UNCERTAIN. PLEASE NOTIFY THE MICROBIOLOGY DEPARTMENT WITHIN ONE WEEK IF SPECIATION AND SENSITIVITIES ARE REQUIRED. Performed at [facility], [place] [address] [place], [place] [address]    Report Status 04/28/2018 FINAL  Final  Blood Culture ID Panel (Reflexed)     Status: Abnormal   Collection Time: 04/25/18  6:54 PM  Result Value Ref Range Status   Enterococcus species NOT DETECTED NOT DETECTED Final   Listeria monocytogenes NOT DETECTED NOT DETECTED Final   Staphylococcus species DETECTED (A) NOT DETECTED Final    Comment: Methicillin (oxacillin) susceptible coagulase negative staphylococcus. Possible blood culture contaminant (unless isolated from more than one blood culture draw or clinical case suggests pathogenicity). No antibiotic treatment is indicated for blood  culture contaminants. CRITICAL RESULT CALLED TO, READ BACK BY AND VERIFIED WITH: B.GREEN,PHARMD AT 0046 ON 04/27/18 BY G.MCADOO    Staphylococcus aureus (BCID) NOT DETECTED NOT DETECTED  Final   Methicillin resistance NOT DETECTED NOT DETECTED Final   Streptococcus species NOT DETECTED NOT DETECTED Final   Streptococcus agalactiae NOT DETECTED NOT DETECTED Corrected    Comment: CORRECTED ON 01/26 AT 0102: PREVIOUSLY REPORTED AS NOT DETECTED CRITICAL RESULT CALLED TO, READ BACK BY AND VERIFIED WITH: B.GREEN,PHARMD AT 0046 ON 04/27/18 BY G.MCADOO   Streptococcus pneumoniae NOT DETECTED NOT DETECTED Final   Streptococcus pyogenes NOT DETECTED NOT DETECTED Final   Acinetobacter baumannii NOT DETECTED NOT DETECTED Final   Enterobacteriaceae species NOT DETECTED NOT DETECTED Final   Enterobacter cloacae complex NOT DETECTED NOT DETECTED Final   Escherichia coli NOT DETECTED NOT DETECTED Final   Klebsiella oxytoca NOT DETECTED NOT DETECTED Final   Klebsiella pneumoniae NOT DETECTED NOT DETECTED Final   Proteus species NOT DETECTED NOT DETECTED Final   Serratia marcescens NOT DETECTED  NOT DETECTED Final   Haemophilus influenzae NOT DETECTED NOT DETECTED Final   Neisseria meningitidis NOT DETECTED NOT DETECTED Final   Pseudomonas aeruginosa NOT DETECTED NOT DETECTED Final   Candida albicans NOT DETECTED NOT DETECTED Final   Candida glabrata NOT DETECTED NOT DETECTED Final   Candida krusei NOT DETECTED NOT DETECTED Final   Candida parapsilosis NOT DETECTED NOT DETECTED Final   Candida tropicalis NOT DETECTED NOT DETECTED Final    Comment: Performed at [facility], [place] [address] [place], [place] [address]  Culture, blood (routine x 2)     Status: None   Collection Time: 04/25/18  7:18 PM  Result Value Ref Range Status   Specimen Description   Final    BLOOD RIGHT HAND Performed at [facility], [place] [address] [place], [place] [address]    Special Requests   Final    BOTTLES DRAWN AEROBIC AND ANAEROBIC Blood Culture adequate volume Performed at [facility] [address], [place], [place] [address]    Culture   Final    NO GROWTH 5  DAYS Performed at [facility], [place] [address] [place], [place] [address]    Report Status 05/01/2018 FINAL  Final  Urine culture     Status: Abnormal   Collection Time: 04/25/18  8:52 PM  Result Value Ref Range Status   Specimen Description URINE, RANDOM  Final   Special Requests   Final    NONE Performed at [facility] [address] [place], [place] [address]    Culture >=100,000 COLONIES/mL ESCHERICHIA COLI (A)  Final   Report Status 04/28/2018 FINAL  Final   Organism ID, Bacteria ESCHERICHIA COLI (A)  Final      Susceptibility   Escherichia coli - MIC*    AMPICILLIN <=2 SENSITIVE Sensitive     CEFAZOLIN <=4 SENSITIVE Sensitive     CEFTRIAXONE <=1 SENSITIVE Sensitive     CIPROFLOXACIN <=0.25 SENSITIVE Sensitive     GENTAMICIN <=1 SENSITIVE Sensitive     IMIPENEM <=0.25 SENSITIVE Sensitive     NITROFURANTOIN <=16 SENSITIVE Sensitive     TRIMETH/SULFA <=20 SENSITIVE Sensitive     AMPICILLIN/SULBACTAM <=2 SENSITIVE Sensitive     PIP/TAZO <=4 SENSITIVE Sensitive     Extended ESBL NEGATIVE Sensitive     * >=100,000 COLONIES/mL ESCHERICHIA COLI  Respiratory Panel by PCR     Status: Abnormal   Collection Time: 04/26/18 12:28 AM  Result Value Ref Range Status   Adenovirus NOT DETECTED NOT DETECTED Final   Coronavirus 229E NOT DETECTED NOT DETECTED Final   Coronavirus HKU1 NOT DETECTED NOT DETECTED Final   Coronavirus NL63 NOT DETECTED NOT DETECTED Final   Coronavirus OC43 NOT DETECTED NOT DETECTED Final   Metapneumovirus NOT DETECTED NOT DETECTED Final   Rhinovirus / Enterovirus NOT DETECTED NOT DETECTED Final   Influenza A NOT DETECTED NOT DETECTED Final   Influenza B NOT DETECTED NOT DETECTED Final   Parainfluenza Virus 1 NOT DETECTED NOT DETECTED Final   Parainfluenza Virus 2 NOT DETECTED NOT DETECTED Final   Parainfluenza Virus 3 NOT DETECTED NOT DETECTED Final   Parainfluenza Virus 4 NOT DETECTED NOT DETECTED Final   Respiratory Syncytial  Virus DETECTED (A) NOT DETECTED Final    Comment: CRITICAL RESULT CALLED TO, READ BACK BY AND VERIFIED WITH: RN B MAY [id number] [id number]    Bordetella pertussis NOT DETECTED NOT DETECTED Final   Chlamydophila pneumoniae NOT DETECTED NOT DETECTED Final  Mycoplasma pneumoniae NOT DETECTED NOT DETECTED Final    Comment: Performed at [facility], [place] [address], [place], [place] [address]  MRSA PCR Screening     Status: None   Collection Time: 04/26/18  2:05 AM  Result Value Ref Range Status   MRSA by PCR NEGATIVE NEGATIVE Final    Comment:        The GeneXpert MRSA Assay (FDA approved for NASAL specimens only), is one component of a comprehensive MRSA colonization surveillance program. It is not intended to diagnose MRSA infection nor to guide or monitor treatment for MRSA infections. Performed at [facility], [place] [address] [place], [place] [address]   Blood culture (routine x 2)     Status: None (Preliminary result)   Collection Time: 05/01/18 12:22 AM  Result Value Ref Range Status   Specimen Description BLOOD LEFT HAND  Final   Special Requests   Final    BOTTLES DRAWN AEROBIC AND ANAEROBIC Blood Culture adequate volume   Culture   Final    NO GROWTH 4 DAYS Performed at [facility], [place] [address], [place], [place] [address]    Report Status PENDING  Incomplete  Blood culture (routine x 2)     Status: None (Preliminary result)   Collection Time: 05/01/18 12:47 AM  Result Value Ref Range Status   Specimen Description BLOOD RIGHT HAND  Final   Special Requests   Final    BOTTLES DRAWN AEROBIC AND ANAEROBIC Blood Culture adequate volume   Culture   Final    NO GROWTH 4 DAYS Performed at [facility], [place] [address], [place], [place] [address]    Report Status PENDING  Incomplete     Time coordinating discharge: 40 minutes  SIGNED:   Charlynne Cousins, MD  [facility]

## 2018-05-05 NOTE — Care Management Important Message (Signed)
Important Message  Patient Details  Name: Jose [name] MRN: [id number] Date of Birth: 02-08-1931   Medicare Important Message Given:  Yes    [name] [name] 05/05/2018, 4:12 PM

## 2018-05-05 NOTE — Social Work (Addendum)
3:13 pm [facility] has received [employer] auth for patient. They will accept patient in their SNF today.  12:53 pm Noted therapy evaluations complete. Called [employer] to start authorization, they indicated that [facility] has already started [place] request for SNF.   Awaiting insurance approval. CSW to follow.  Estanislado Emms, LCSW [phone]

## 2018-05-05 NOTE — Social Work (Signed)
Patient will discharge to [facility] SNF Anticipated discharge date: 05/05/18 Family notified: Berish Bohman, spouse Transportation by: Corey Harold  Nurse to call report to [phone].  CSW signing off.  Estanislado Emms, [place]  Clinical Social Worker

## 2018-05-05 NOTE — Clinical Social Work Note (Signed)
Clinical Social Work Assessment  Patient Details  Name: Jose [name] MRN: [id number] Date of Birth: February 17, 1931  Date of referral:  05/05/18               Reason for consult:  Facility Placement, Discharge Planning                Permission sought to share information with:  Facility Sport and exercise psychologist, Family Supports Permission granted to share information::  No  Name::     Siddiq Kaluzny  Agency::  [facility]  Relationship::  spouse  Contact Information:  [phone]  Housing/Transportation Living arrangements for the past 2 months:  [place] of Information:  Spouse Patient Interpreter Needed:  None Criminal Activity/Legal Involvement Pertinent to Current Situation/Hospitalization:  No - Comment as needed Significant Relationships:  Spouse, Adult Children Lives with:  Facility Resident, Self Do you feel safe going back to the place where you live?  Yes Need for family participation in patient care:  Yes (Comment)  Care giving concerns: Patient from [place] initially. Appears he went to SNF after last hospital admission. Not yet evaluated by PT this admission. Needs PT evaluation for insurance approval to return to [facility] SNF.   Social Worker assessment / plan: CSW spoke to patient's wife, [name], on the phone. Patient not oriented. CSW introduced self and role and discussed disposition planning.  Wife wishes for patient to return to [facility] SNF. Noted palliative meeting with family. Wife stated she is unsure if she wants palliative to follow at SNF.  Patient has discharge orders, but has not yet been evaluated by PT/OT. Insurance approval is required for patient to return to SNF, and therapy evals are needed to complete insurance auth request. Spoke to RN and requested therapy evals.  CSW will follow for therapy evals and start [facility] auth. Will support with discharge planning.   Employment status:   Retired Primary school teacher) PT Recommendations:  Not assessed at this time Information / Referral to community resources:  [place]  Patient/Family's Response to care: Spouse appreciative of care.  Patient/Family's Understanding of and Emotional Response to Diagnosis, Current Treatment, and Prognosis: Spouse with understanding of patient's condition. Hesitant about palliative involvement. Wants patient to return to SNF.  Emotional Assessment Appearance:  Appears stated age Attitude/Demeanor/Rapport:  Unable to Assess Affect (typically observed):  Unable to Assess Orientation:  Oriented to Self Alcohol / Substance use:  Not Applicable Psych involvement (Current and /or in the community):  No (Comment)  Discharge Needs  Concerns to be addressed:  Discharge Planning Concerns, Care Coordination Readmission within the last 30 days:  Yes Current discharge risk:  Physical Impairment, Cognitively Impaired, Dependent with Mobility Barriers to Discharge:  Continued Medical Work up, [place], LCSW 05/05/2018, 10:34 AM

## 2018-05-05 NOTE — Evaluation (Signed)
Physical Therapy Evaluation Patient Details Name: Jose [name] MRN: [id number] DOB: 04/06/1930 Today's Date: 05/05/2018   History of Present Illness  85 year old with past medical history relevant for chronic systolic heart failure with EF of 35 to 40% by echo on 04/26/2018, dementia, type 2 diabetes on insulin, hypertension, who presented to the emergency department on 04/25/2018 from skilled nursing facility with altered mental status and acute hypoxic respiratory failure due to RSV subsequently found to have E. coli UTI, aspiration events.  Clinical Impression  Orders received for PT evaluation. Patient demonstrates deficits in functional mobility as indicated below. Will benefit from continued skilled PT to address deficits and maximize function. Will see as indicated and progress as tolerated.  Will need continued therapies upon discharge, recommend return to SNF.  Follow Up Recommendations SNF;[facility]    Equipment Recommendations  None recommended by PT    Recommendations for Other Services       Precautions / Restrictions Precautions Precautions: Fall Restrictions Weight Bearing Restrictions: No      Mobility  Bed Mobility Overal bed mobility: Needs Assistance Bed Mobility: Rolling;Supine to Sit;Sit to Supine Rolling: +2 for physical assistance;+2 for safety/equipment;Total assist   Supine to sit: +2 for physical assistance;+2 for safety/equipment;Max assist Sit to supine: Total assist;+2 for physical assistance;+2 for safety/equipment   General bed mobility comments: Max assist to move to EOB, patient initiated some LE movement but non substantial mobility. Required +2 physical assist  Transfers Overall transfer level: Needs assistance Equipment used: 2 person hand held assist Transfers: Sit to/from Stand           General transfer comment: attempted multiple sit <> stands with max to total assist to power up and reach clearance of buttocks  from surface. 2 person face to face with chuck pad assist.  Ambulation/Gait                Stairs            Wheelchair Mobility    Modified Rankin (Stroke Patients Only)       Balance Overall balance assessment: Needs assistance Sitting-balance support: Bilateral upper extremity supported;Feet supported Sitting balance-Leahy Scale: Poor   Postural control: Posterior lean   Standing balance-Leahy Scale: Poor                               Pertinent Vitals/Pain Pain Assessment: No/denies pain    Home Living Family/patient expects to be discharged to:: Skilled nursing facility                 Additional Comments: [facility]    Prior Function Level of Independence: Needs assistance   Gait / Transfers Assistance Needed: recently nonambulatory     Comments: has English as a second language teacher Dominance        Extremity/Trunk Assessment   Upper Extremity Assessment Upper Extremity Assessment: Generalized weakness    Lower Extremity Assessment Lower Extremity Assessment: Generalized weakness    Cervical / Trunk Assessment Cervical / Trunk Assessment: Kyphotic  Communication   Communication: HOH(patient spoke 1 word)  Cognition Arousal/Alertness: Awake/alert Behavior During Therapy: Flat affect Overall Cognitive Status: History of cognitive impairments - at baseline                                 General Comments: minimally verbal during sesson,  does follow commands and nods appropriately      General Comments      Exercises     Assessment/Plan    PT Assessment Patient needs continued PT services  PT Problem List Decreased strength;Decreased activity tolerance;Decreased mobility;Decreased knowledge of precautions;Decreased safety awareness;Decreased cognition       PT Treatment Interventions Functional mobility training;Therapeutic activities;Patient/family education;Therapeutic exercise    PT  Goals (Current goals can be found in the Care Plan section)  Acute Rehab PT Goals Patient Stated Goal: to be able to walk again PT Goal Formulation: With family Time For Goal Achievement: 05/13/18 Potential to Achieve Goals: Fair    Frequency Min 2X/week   Barriers to discharge        Co-evaluation PT/OT/SLP Co-Evaluation/Treatment: Yes Reason for Co-Treatment: Complexity of the patient's impairments (multi-system involvement);For patient/therapist safety;Necessary to address cognition/behavior during functional activity;To address functional/ADL transfers PT goals addressed during session: Mobility/safety with mobility;Balance OT goals addressed during session: ADL's and self-care       AM-PAC PT "6 Clicks" Mobility  Outcome Measure Help needed turning from your back to your side while in a flat bed without using bedrails?: Total Help needed moving from lying on your back to sitting on the side of a flat bed without using bedrails?: Total Help needed moving to and from a bed to a chair (including a wheelchair)?: Total Help needed standing up from a chair using your arms (e.g., wheelchair or bedside chair)?: Total Help needed to walk in hospital room?: Total Help needed climbing 3-5 steps with a railing? : Total 6 Click Score: 6    End of Session Equipment Utilized During Treatment: Gait belt Activity Tolerance: Patient tolerated treatment well Patient left: in bed;with bed alarm set;with call bell/phone within reach;with family/visitor present Nurse Communication: Mobility status PT Visit Diagnosis: Unsteadiness on feet (R26.81)    Time: 7510-2585 PT Time Calculation (min) (ACUTE ONLY): 22 min   Charges:   PT Evaluation $PT Eval Moderate Complexity: 1 Mod          Alben Deeds, PT DPT  Board Certified Neurologic Specialist Acute Rehabilitation Services Pager [phone] Office [phone]   Duncan Dull 05/05/2018, 12:10 PM

## 2018-05-06 LAB — CULTURE, BLOOD (ROUTINE X 2)
Culture: NO GROWTH
Culture: NO GROWTH
Special Requests: ADEQUATE
Special Requests: ADEQUATE

## 2018-05-07 ENCOUNTER — Non-Acute Institutional Stay (SKILLED_NURSING_FACILITY): Payer: Medicare Other | Admitting: Internal Medicine

## 2018-05-07 ENCOUNTER — Ambulatory Visit: Payer: Medicare Other | Admitting: Podiatry

## 2018-05-07 ENCOUNTER — Encounter: Payer: Self-pay | Admitting: Internal Medicine

## 2018-05-07 DIAGNOSIS — G309 Alzheimer's disease, unspecified: Secondary | ICD-10-CM | POA: Diagnosis not present

## 2018-05-07 DIAGNOSIS — R1312 Dysphagia, oropharyngeal phase: Secondary | ICD-10-CM | POA: Diagnosis not present

## 2018-05-07 DIAGNOSIS — F339 Major depressive disorder, recurrent, unspecified: Secondary | ICD-10-CM

## 2018-05-07 DIAGNOSIS — N183 Chronic kidney disease, stage 3 unspecified: Secondary | ICD-10-CM

## 2018-05-07 DIAGNOSIS — I10 Essential (primary) hypertension: Secondary | ICD-10-CM

## 2018-05-07 DIAGNOSIS — I5042 Chronic combined systolic (congestive) and diastolic (congestive) heart failure: Secondary | ICD-10-CM

## 2018-05-07 DIAGNOSIS — E785 Hyperlipidemia, unspecified: Secondary | ICD-10-CM

## 2018-05-07 DIAGNOSIS — E1122 Type 2 diabetes mellitus with diabetic chronic kidney disease: Secondary | ICD-10-CM | POA: Diagnosis not present

## 2018-05-07 DIAGNOSIS — E1169 Type 2 diabetes mellitus with other specified complication: Secondary | ICD-10-CM

## 2018-05-07 DIAGNOSIS — D509 Iron deficiency anemia, unspecified: Secondary | ICD-10-CM

## 2018-05-07 DIAGNOSIS — F0281 Dementia in other diseases classified elsewhere with behavioral disturbance: Secondary | ICD-10-CM

## 2018-05-07 NOTE — Progress Notes (Signed)
Provider:   Location:  [facility] Room Number: 64 Place of Service:  SNF (31)  PCP: [name], MD Patient Care Team: [name], MD as PCP - General (Internal Medicine) [name], MD as PCP - Cardiology (Cardiology) Mast, Man X, NP as Nurse Practitioner (Internal Medicine)  Extended Emergency Contact Information Primary Emergency Contact: [name] Address: [place], [place] Phone: [phone] Mobile Phone: [phone] Relation: Spouse Secondary Emergency Contact: [name] [place] Phone: [phone] Work Phone: [phone] Relation: Daughter   Code Status: Full Code Goals of Care: Advanced Directive information Advanced Directives 05/07/2018  Does Patient Have a Medical Advance Directive? Yes  Type of Paramedic of [place];Living will  Does patient want to make changes to medical advance directive? No - Patient declined  Copy of [place] in Chart? Yes - validated most recent copy scanned in chart (See row information)  Would patient like information on creating a medical advance directive? -  Pre-existing out of facility DNR order (yellow form or pink MOST form) -      Chief Complaint  Patient presents with  . Readmit To SNF    readmit to facility     HPI: Patient is a 85 y.o. male seen today for Readmission to SNF .  Patient is long term Resident of SNF facility. He was admitted in Hospital from 01/24-01/29 for Aspiration Pneumonia.with Respiratory failure He came to facility and was again send to the hospital on 01/30 for episode of Choking and Coughing.He was discharged on 02/03 Patient has a history of type 2 diabetes, moderate dementia, hypertension, systolic CHF, depression, anemia Patient was initially sent to the ED on zero 1/24 for altered mental status and acute hypoxic respiratory failure.  In  the hospital he was found to have a left lower lobe pneumonia he also was found that he was aspirating on thin liquids as well as food.  Did get better with IV antibiotics and was discharged on p.o.Antibiotics And back to the facility with strict instructions for his p.o. due to his aspiration His echo showed EF of 35 to 40% Patient stayed only for a day in the facility and he is started to choke on his food and was back in the hospital.  Was treated with IV Unasyn.  Hospice met with family but at this time family wants him to be full code.  He is now discharged back on oral Augmentin. He is on mechanical soft diet and Nectar Thick  Liquid. Unable to give me any history.  Per nurses he usually does not talk.  I also talked to therapy he was walking with mod assist and a walker before he went to the hospital but at this time he is wheelchair dependent with max assist for transfers.  Past Medical History:  Diagnosis Date  . Abnormality of gait   . Backache, unspecified   . CHF (congestive heart failure) ([place])   . Colon polyps    adenomatous  . Dementia ([place]) 11/12/2012  . Depression   . Diabetes mellitus without complication ([place])   . Diverticulosis   . Essential and other specified forms of tremor   . Hemorrhoids   . History of bilateral hip replacements 11/12/2012  . Memory loss   . 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 Pain in  joint, pelvic region and thigh   . Skin cancer of scalp   . Spinal stenosis, lumbar region, without neurogenic claudication   . Unspecified hereditary and idiopathic peripheral neuropathy    Past Surgical History:  Procedure Laterality Date  . CATARACT EXTRACTION, BILATERAL  2012  . JOINT REPLACEMENT    . RETINAL LASER PROCEDURE  2012   retinal wrinkle  . SKIN CANCER EXCISION    . TOTAL HIP ARTHROPLASTY Left 2000  . TOTAL HIP ARTHROPLASTY (aka REPLACEMENT) Right 2011    reports that he has quit smoking. His smoking use  included cigarettes. He has never used smokeless tobacco. He reports current alcohol use of about 14.0 standard drinks of alcohol per week. He reports that he does not use drugs. Social History   Socioeconomic History  . Marital status: Married    Spouse name: [name]  . Number of children: 2  . Years of education: Chief Executive Officer  . Highest education level: Not on file  Occupational History  . 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 Financial resource strain: Not hard at all  . Food insecurity:    Worry: Never true    Inability: Never true  . Transportation needs:    Medical: No    Non-medical: No  Tobacco Use  . Smoking status: Former Smoker    Types: Cigarettes  . Smokeless tobacco: Never Used  Substance and Sexual Activity  . Alcohol use: Yes    Alcohol/week: 14.0 standard drinks    Types: 14 Glasses of wine per week    Comment: occas, one glass of wine before dinner,sometimes 1/2 glass  . Drug use: No  . Sexual activity: Never  Lifestyle  . Physical activity:    Days per week: 0 days    Minutes per session: 0 min  . Stress: Only a little  Relationships  . Social connections:    Talks on phone: Never    Gets together: More than three times a week    Attends religious service: Never    Active member of club or organization: No    Attends meetings of clubs or organizations: Never    Relationship status: Married  . Intimate partner violence:    Fear of current or ex partner: No    Emotionally abused: No    Physically abused: No    Forced sexual activity: No  Other Topics Concern  . Not on file  Social History Narrative   Patient is right handed and resides in home with wife    Functional Status Survey:    Family History  Problem Relation Age of Onset  . Heart failure Mother     Health Maintenance  Topic Date Due  . OPHTHALMOLOGY EXAM  06/06/1940  . TETANUS/TDAP  09/29/2026 (Originally 12/20/2020)  . HEMOGLOBIN A1C   06/03/2018  . FOOT EXAM  08/07/2018  . INFLUENZA VACCINE  Completed  . PNA vac Low Risk Adult  Completed    Allergies  Allergen Reactions  . Axona [Bacid]     Unknown per MAR  . Gabapentin     Hallucinations    Outpatient Encounter Medications as of 05/07/2018  Medication Sig  . amoxicillin-clavulanate (AUGMENTIN) 875-125 MG tablet Take 1 tablet by mouth every 12 (twelve) hours for 2 days.  Marland Kitchen buPROPion (WELLBUTRIN SR) 100 MG 12 hr tablet Take 100 mg by mouth daily.  . carvedilol (COREG) 3.125 MG tablet Take 1 tablet (3.125  mg total) by mouth 2 (two) times daily with a meal.  . Cholecalciferol (VITAMIN D) 1000 UNITS capsule Take 1,000 Units by mouth daily.    Marland Kitchen docusate sodium (COLACE) 100 MG capsule Take 100 mg by mouth daily.  Marland Kitchen donepezil (ARICEPT) 23 MG TABS tablet Take 1 tablet (23 mg total) by mouth daily.  . dorzolamide-timolol (COSOPT) 22.3-6.8 MG/ML ophthalmic solution Place 1 drop into the left eye 2 (two) times daily.   . Emollient (CERAVE) CREA Apply 1 application topically See admin instructions. Apply thin layer topically to arms, legs, and trunk once daily as needed for skin care  . ferrous sulfate 325 (65 FE) MG tablet Take 325 mg by mouth daily with breakfast.  . Flaxseed, Linseed, (FLAXSEED OIL) 1000 MG CAPS Take 1,000 mg by mouth daily.    . folic acid (FOLVITE) 976 MCG tablet Take 800 mcg by mouth every evening.   . furosemide (LASIX) 20 MG tablet Take 1 tablet (20 mg total) by mouth daily.  Marland Kitchen guaifenesin (ROBITUSSIN) 100 MG/5ML syrup Take 200 mg by mouth 2 (two) times daily as needed for cough.   . hydrocortisone (ANUSOL-HC) 2.5 % rectal cream Place 1 application rectally at bedtime as needed for hemorrhoids or anal itching.  . insulin glargine (LANTUS) 100 UNIT/ML injection Inject 10 Units into the skin at bedtime.   Marland Kitchen lisinopril (PRINIVIL,ZESTRIL) 5 MG tablet Take 1 tablet (5 mg total) by mouth daily.  . memantine (NAMENDA XR) 28 MG CP24 24 hr capsule Take 1  capsule (28 mg total) by mouth daily.  . metFORMIN (GLUCOPHAGE) 500 MG tablet Take 500 mg by mouth 2 (two) times daily with a meal.  . Multiple Vitamin (MULTIVITAMIN) tablet Take 1 tablet by mouth daily.    . simvastatin (ZOCOR) 5 MG tablet Take 5 mg by mouth daily.  . sitaGLIPtin (JANUVIA) 25 MG tablet Take 1 tablet (25 mg total) by mouth daily.  Marland Kitchen zinc oxide 20 % ointment Apply 1 application topically 3 (three) times daily.   Marland Kitchen nystatin cream (MYCOSTATIN) Apply 1 application topically 2 (two) times daily as needed for dry skin. Apply to redness on  scrotum/sacral areas  . Probiotic Product (ALIGN) 4 MG CAPS Take 4 mg by mouth daily.   No facility-administered encounter medications on file as of 05/07/2018.     Review of Systems  Unable to perform ROS: Dementia    Vitals:   05/07/18 0904  BP: (!) 120/53  Pulse: 63  Resp: 18  Temp: 98 F (36.7 C)  SpO2: 98%  Weight: 171 lb (77.6 kg)  Height: '6\' 1"'$  (1.854 m)   Body mass index is 22.56 kg/m. Physical Exam Vitals signs and nursing note reviewed.  Constitutional:      Appearance: Normal appearance.  HENT:     Head: Normocephalic.     Nose: Nose normal.     Mouth/Throat:     Mouth: Mucous membranes are moist.     Pharynx: Oropharynx is clear.  Eyes:     Pupils: Pupils are equal, round, and reactive to light.  Neck:     Musculoskeletal: Neck supple.  Cardiovascular:     Rate and Rhythm: Normal rate and regular rhythm.     Pulses: Normal pulses.     Heart sounds: Normal heart sounds. No murmur.  Pulmonary:     Effort: Pulmonary effort is normal. No respiratory distress.     Breath sounds: Normal breath sounds. No wheezing or rales.  Abdominal:  General: Abdomen is flat. There is no distension.     Palpations: Abdomen is soft.     Tenderness: There is no abdominal tenderness. There is no guarding.  Musculoskeletal:     Comments: Trace swelling Bilateral  Skin:    Comments: Some redness in the Antecubital area    Neurological:     General: No focal deficit present.     Mental Status: He is alert.     Comments: Patient follows simple Commands. But just mumble few words. Aphasic.  Psychiatric:        Mood and Affect: Mood normal.        Behavior: Behavior normal.     Labs reviewed: Basic Metabolic Panel: Recent Labs    04/26/18 0316  04/30/18 0534 05/01/18 0033 05/01/18 0034 05/01/18 0607  NA 148*   < > 145 143 141 144  K 3.7   < > 3.6 3.8 4.0 4.1  CL 115*   < > 118*  --  111 111  CO2 26   < > 23  --  21* 24  GLUCOSE 168*   < > 133*  --  224* 163*  BUN 34*   < > 16  --  15 15  CREATININE 1.32*   < > 0.95  --  1.15 1.15  CALCIUM 8.2*   < > 8.3*  --  8.2* 8.0*  MG 2.2  --   --   --   --   --   PHOS 3.3  --   --   --   --   --    < > = values in this interval not displayed.   Liver Function Tests: Recent Labs    04/25/18 1853 04/27/18 0321 05/01/18 0034  AST 19 44* 47*  ALT 16 40 60*  ALKPHOS 75 91 111  BILITOT 0.4 0.5 0.6  PROT 6.3* 5.5* 6.2*  ALBUMIN 3.0* 2.5* 2.6*   No results for input(s): LIPASE, AMYLASE in the last 8760 hours. No results for input(s): AMMONIA in the last 8760 hours. CBC: Recent Labs    04/27/18 0321 04/27/18 1122 04/30/18 0534 05/01/18 0033 05/01/18 0034 05/01/18 0607  WBC 5.4 5.9 8.8  --  13.1* 10.9*  NEUTROABS 3.7 4.5  --   --  10.9*  --   HGB 8.1* 8.2* 8.2* 9.2* 9.4* 9.4*  HCT 28.5* 29.1* 28.8* 27.0* 32.9* 31.8*  MCV 92.8 94.8 91.1  --  89.9 89.1  PLT 128* 121* 143*  --  185 134*   Cardiac Enzymes: Recent Labs    04/26/18 0003 04/26/18 0316 04/26/18 1203  TROPONINI 0.04* 0.07* 0.06*   BNP: Invalid input(s): POCBNP Lab Results  Component Value Date   HGBA1C 7.7 12/03/2017   Lab Results  Component Value Date   TSH 2.61 08/08/2017   No results found for: VITAMINB12 No results found for: FOLATE Lab Results  Component Value Date   IRON 32 04/10/2018   FERRITIN 15 04/10/2018    Imaging and Procedures obtained prior to SNF  admission: Dg Chest Portable 1 View  Result Date: 05/01/2018 CLINICAL DATA:  Shortness of breath. Recent left lower lobe pneumonia. EXAM: PORTABLE CHEST 1 VIEW COMPARISON:  Radiographs 3 days ago 04/28/2018, additional priors. FINDINGS: No significant change in left lung base opacity from prior exam. Unchanged right perihilar atelectasis. Unchanged heart size and mediastinal contours. Unchanged low lung volumes. Mild basilar septal thickening and peribronchial thickening suspicious for mild pulmonary edema. No pneumothorax. IMPRESSION: 1. No significant  change in left lung base opacity from prior exam. Unchanged right perihilar atelectasis. 2. Mild basilar septal and peribronchial thickening, suspicious for pulmonary edema. Electronically Signed   By: Keith Rake M.D.   On: 05/01/2018 00:52    Assessment/Plan  Oropharyngeal dysphagia Patient has been struggling with oropharyngeal dysphagia for the past 1-1/2 years.   At this time he is supposed to follow a strict nectar thick diet and mechanical soft diet With his severe dementia patient is not a candidate for any aggressive therapy.   I have discussed this with the nurses and the speech therapy Aspiration pneumonia Finishing the course of Augmentin On oxygen 2 L Continue aspiration precautions  Type 2 diabetes mellitus  Blood sugars running mostly less than 200 Last A1C was 7.7 On Januvia and Metformin Hyperlipidemia  Continue on statin  Alzheimer's dementia  Patient has significant dementia.  Per nurses there has been further decline in past few months.  He is not a candidate for any aggressive therapy.   At this time he was evaluated by palliative care and hospice but patient is full code for now per his family wishes Iron deficiency anemia Low been stable on iron No further work-up at this time  Major depression Patient seems to be stable on Wellbutrin twice a day  Chronic Systolic failure Continue on Coreg, Lasix He  does have cardiomyopathy and a EF of 30 to 35% We will continue to monitor weight  Essential hypertension Pressure stable on Coreg and lisinopril General Decline D/W therapy they are going to work with him  Family/ staff Communication:   Labs/tests ordered:  Total time spent in this patient care encounter was 45_ minutes; greater than 50% of the visit spent counseling patient, reviewing records , Labs and coordinating care for problems addressed at this encounter.

## 2018-05-11 ENCOUNTER — Other Ambulatory Visit: Payer: Self-pay

## 2018-05-11 ENCOUNTER — Emergency Department (HOSPITAL_COMMUNITY): Payer: Medicare Other

## 2018-05-11 ENCOUNTER — Encounter (HOSPITAL_COMMUNITY): Payer: Self-pay | Admitting: Emergency Medicine

## 2018-05-11 ENCOUNTER — Inpatient Hospital Stay (HOSPITAL_COMMUNITY)
Admission: EM | Admit: 2018-05-11 | Discharge: 2018-05-26 | DRG: 177 | Disposition: A | Discharge status: Skilled Nursing Facility | Payer: Medicare Other | Source: Skilled Nursing Facility | Attending: Internal Medicine | Admitting: Internal Medicine

## 2018-05-11 DIAGNOSIS — Z85828 Personal history of other malignant neoplasm of skin: Secondary | ICD-10-CM

## 2018-05-11 DIAGNOSIS — R627 Adult failure to thrive: Secondary | ICD-10-CM | POA: Diagnosis not present

## 2018-05-11 DIAGNOSIS — M2550 Pain in unspecified joint: Secondary | ICD-10-CM | POA: Diagnosis not present

## 2018-05-11 DIAGNOSIS — R918 Other nonspecific abnormal finding of lung field: Secondary | ICD-10-CM | POA: Diagnosis not present

## 2018-05-11 DIAGNOSIS — N182 Chronic kidney disease, stage 2 (mild): Secondary | ICD-10-CM | POA: Diagnosis present

## 2018-05-11 DIAGNOSIS — R9431 Abnormal electrocardiogram [ECG] [EKG]: Secondary | ICD-10-CM | POA: Diagnosis not present

## 2018-05-11 DIAGNOSIS — J9601 Acute respiratory failure with hypoxia: Secondary | ICD-10-CM | POA: Diagnosis not present

## 2018-05-11 DIAGNOSIS — R1310 Dysphagia, unspecified: Secondary | ICD-10-CM | POA: Diagnosis not present

## 2018-05-11 DIAGNOSIS — K922 Gastrointestinal hemorrhage, unspecified: Secondary | ICD-10-CM | POA: Diagnosis not present

## 2018-05-11 DIAGNOSIS — Z794 Long term (current) use of insulin: Secondary | ICD-10-CM

## 2018-05-11 DIAGNOSIS — R404 Transient alteration of awareness: Secondary | ICD-10-CM | POA: Diagnosis not present

## 2018-05-11 DIAGNOSIS — J90 Pleural effusion, not elsewhere classified: Secondary | ICD-10-CM | POA: Diagnosis not present

## 2018-05-11 DIAGNOSIS — E1165 Type 2 diabetes mellitus with hyperglycemia: Secondary | ICD-10-CM | POA: Diagnosis not present

## 2018-05-11 DIAGNOSIS — Z8701 Personal history of pneumonia (recurrent): Secondary | ICD-10-CM

## 2018-05-11 DIAGNOSIS — J9600 Acute respiratory failure, unspecified whether with hypoxia or hypercapnia: Secondary | ICD-10-CM | POA: Diagnosis not present

## 2018-05-11 DIAGNOSIS — G609 Hereditary and idiopathic neuropathy, unspecified: Secondary | ICD-10-CM | POA: Diagnosis present

## 2018-05-11 DIAGNOSIS — R0989 Other specified symptoms and signs involving the circulatory and respiratory systems: Secondary | ICD-10-CM

## 2018-05-11 DIAGNOSIS — Z96643 Presence of artificial hip joint, bilateral: Secondary | ICD-10-CM

## 2018-05-11 DIAGNOSIS — E785 Hyperlipidemia, unspecified: Secondary | ICD-10-CM | POA: Diagnosis present

## 2018-05-11 DIAGNOSIS — Z79899 Other long term (current) drug therapy: Secondary | ICD-10-CM

## 2018-05-11 DIAGNOSIS — R410 Disorientation, unspecified: Secondary | ICD-10-CM | POA: Diagnosis not present

## 2018-05-11 DIAGNOSIS — R0689 Other abnormalities of breathing: Secondary | ICD-10-CM | POA: Diagnosis not present

## 2018-05-11 DIAGNOSIS — I10 Essential (primary) hypertension: Secondary | ICD-10-CM | POA: Diagnosis present

## 2018-05-11 DIAGNOSIS — J9811 Atelectasis: Secondary | ICD-10-CM | POA: Diagnosis not present

## 2018-05-11 DIAGNOSIS — Z87891 Personal history of nicotine dependence: Secondary | ICD-10-CM

## 2018-05-11 DIAGNOSIS — J690 Pneumonitis due to inhalation of food and vomit: Secondary | ICD-10-CM | POA: Diagnosis not present

## 2018-05-11 DIAGNOSIS — Z87440 Personal history of urinary (tract) infections: Secondary | ICD-10-CM

## 2018-05-11 DIAGNOSIS — Z7401 Bed confinement status: Secondary | ICD-10-CM

## 2018-05-11 DIAGNOSIS — G309 Alzheimer's disease, unspecified: Secondary | ICD-10-CM | POA: Diagnosis not present

## 2018-05-11 DIAGNOSIS — E876 Hypokalemia: Secondary | ICD-10-CM | POA: Diagnosis not present

## 2018-05-11 DIAGNOSIS — F419 Anxiety disorder, unspecified: Secondary | ICD-10-CM | POA: Diagnosis present

## 2018-05-11 DIAGNOSIS — Z7189 Other specified counseling: Secondary | ICD-10-CM

## 2018-05-11 DIAGNOSIS — Z91018 Allergy to other foods: Secondary | ICD-10-CM

## 2018-05-11 DIAGNOSIS — I130 Hypertensive heart and chronic kidney disease with heart failure and stage 1 through stage 4 chronic kidney disease, or unspecified chronic kidney disease: Secondary | ICD-10-CM | POA: Diagnosis not present

## 2018-05-11 DIAGNOSIS — J189 Pneumonia, unspecified organism: Secondary | ICD-10-CM | POA: Diagnosis not present

## 2018-05-11 DIAGNOSIS — E1169 Type 2 diabetes mellitus with other specified complication: Secondary | ICD-10-CM | POA: Diagnosis not present

## 2018-05-11 DIAGNOSIS — J9622 Acute and chronic respiratory failure with hypercapnia: Secondary | ICD-10-CM | POA: Diagnosis not present

## 2018-05-11 DIAGNOSIS — Z86010 Personal history of colonic polyps: Secondary | ICD-10-CM

## 2018-05-11 DIAGNOSIS — Z8249 Family history of ischemic heart disease and other diseases of the circulatory system: Secondary | ICD-10-CM

## 2018-05-11 DIAGNOSIS — Z9841 Cataract extraction status, right eye: Secondary | ICD-10-CM

## 2018-05-11 DIAGNOSIS — D649 Anemia, unspecified: Secondary | ICD-10-CM | POA: Diagnosis not present

## 2018-05-11 DIAGNOSIS — F0281 Dementia in other diseases classified elsewhere with behavioral disturbance: Secondary | ICD-10-CM

## 2018-05-11 DIAGNOSIS — R0602 Shortness of breath: Secondary | ICD-10-CM

## 2018-05-11 DIAGNOSIS — D500 Iron deficiency anemia secondary to blood loss (chronic): Secondary | ICD-10-CM | POA: Diagnosis not present

## 2018-05-11 DIAGNOSIS — E1151 Type 2 diabetes mellitus with diabetic peripheral angiopathy without gangrene: Secondary | ICD-10-CM | POA: Diagnosis present

## 2018-05-11 DIAGNOSIS — I5042 Chronic combined systolic (congestive) and diastolic (congestive) heart failure: Secondary | ICD-10-CM | POA: Diagnosis not present

## 2018-05-11 DIAGNOSIS — D62 Acute posthemorrhagic anemia: Secondary | ICD-10-CM | POA: Diagnosis not present

## 2018-05-11 DIAGNOSIS — E1122 Type 2 diabetes mellitus with diabetic chronic kidney disease: Secondary | ICD-10-CM | POA: Diagnosis not present

## 2018-05-11 DIAGNOSIS — Z993 Dependence on wheelchair: Secondary | ICD-10-CM

## 2018-05-11 DIAGNOSIS — Z66 Do not resuscitate: Secondary | ICD-10-CM | POA: Diagnosis not present

## 2018-05-11 DIAGNOSIS — Z515 Encounter for palliative care: Secondary | ICD-10-CM

## 2018-05-11 DIAGNOSIS — H5789 Other specified disorders of eye and adnexa: Secondary | ICD-10-CM | POA: Diagnosis present

## 2018-05-11 DIAGNOSIS — J9621 Acute and chronic respiratory failure with hypoxia: Secondary | ICD-10-CM | POA: Diagnosis not present

## 2018-05-11 DIAGNOSIS — F0280 Dementia in other diseases classified elsewhere without behavioral disturbance: Secondary | ICD-10-CM | POA: Diagnosis present

## 2018-05-11 DIAGNOSIS — E870 Hyperosmolality and hypernatremia: Secondary | ICD-10-CM | POA: Diagnosis not present

## 2018-05-11 DIAGNOSIS — F329 Major depressive disorder, single episode, unspecified: Secondary | ICD-10-CM | POA: Diagnosis present

## 2018-05-11 DIAGNOSIS — R1312 Dysphagia, oropharyngeal phase: Secondary | ICD-10-CM | POA: Diagnosis not present

## 2018-05-11 DIAGNOSIS — I959 Hypotension, unspecified: Secondary | ICD-10-CM | POA: Diagnosis present

## 2018-05-11 DIAGNOSIS — Z9842 Cataract extraction status, left eye: Secondary | ICD-10-CM

## 2018-05-11 DIAGNOSIS — R195 Other fecal abnormalities: Secondary | ICD-10-CM | POA: Diagnosis not present

## 2018-05-11 DIAGNOSIS — Z888 Allergy status to other drugs, medicaments and biological substances status: Secondary | ICD-10-CM

## 2018-05-11 DIAGNOSIS — R402411 Glasgow coma scale score 13-15, in the field [EMT or ambulance]: Secondary | ICD-10-CM | POA: Diagnosis not present

## 2018-05-11 DIAGNOSIS — J9610 Chronic respiratory failure, unspecified whether with hypoxia or hypercapnia: Secondary | ICD-10-CM

## 2018-05-11 DIAGNOSIS — G301 Alzheimer's disease with late onset: Secondary | ICD-10-CM | POA: Diagnosis not present

## 2018-05-11 LAB — CBC WITH DIFFERENTIAL/PLATELET
Abs Immature Granulocytes: 0.09 10*3/uL — ABNORMAL HIGH (ref 0.00–0.07)
Basophils Absolute: 0 10*3/uL (ref 0.0–0.1)
Basophils Relative: 0 %
Eosinophils Absolute: 0.1 10*3/uL (ref 0.0–0.5)
Eosinophils Relative: 0 %
HCT: 26 % — ABNORMAL LOW (ref 39.0–52.0)
Hemoglobin: 7.6 g/dL — ABNORMAL LOW (ref 13.0–17.0)
Immature Granulocytes: 1 %
Lymphocytes Relative: 7 %
Lymphs Abs: 1.1 10*3/uL (ref 0.7–4.0)
MCH: 26.7 pg (ref 26.0–34.0)
MCHC: 29.2 g/dL — ABNORMAL LOW (ref 30.0–36.0)
MCV: 91.2 fL (ref 80.0–100.0)
Monocytes Absolute: 1.2 10*3/uL — ABNORMAL HIGH (ref 0.1–1.0)
Monocytes Relative: 8 %
Neutro Abs: 13.2 10*3/uL — ABNORMAL HIGH (ref 1.7–7.7)
Neutrophils Relative %: 84 %
Platelets: 182 10*3/uL (ref 150–400)
RBC: 2.85 MIL/uL — ABNORMAL LOW (ref 4.22–5.81)
RDW: 20 % — ABNORMAL HIGH (ref 11.5–15.5)
WBC: 15.6 10*3/uL — ABNORMAL HIGH (ref 4.0–10.5)
nRBC: 0 % (ref 0.0–0.2)

## 2018-05-11 LAB — COMPREHENSIVE METABOLIC PANEL
ALT: 27 U/L (ref 0–44)
AST: 37 U/L (ref 15–41)
Albumin: 2.5 g/dL — ABNORMAL LOW (ref 3.5–5.0)
Alkaline Phosphatase: 86 U/L (ref 38–126)
Anion gap: 11 (ref 5–15)
BUN: 25 mg/dL — ABNORMAL HIGH (ref 8–23)
CALCIUM: 8.3 mg/dL — AB (ref 8.9–10.3)
CO2: 21 mmol/L — AB (ref 22–32)
Chloride: 104 mmol/L (ref 98–111)
Creatinine, Ser: 1.15 mg/dL (ref 0.61–1.24)
GFR calc Af Amer: >60 mL/min (ref >60)
GFR calc non Af Amer: 58 mL/min — ABNORMAL LOW (ref >60)
Glucose, Bld: 256 mg/dL — ABNORMAL HIGH (ref 70–99)
Potassium: 4.8 mmol/L (ref 3.5–5.1)
SODIUM: 136 mmol/L (ref 135–145)
Total Bilirubin: 0.5 mg/dL (ref 0.3–1.2)
Total Protein: 5.4 g/dL — ABNORMAL LOW (ref 6.5–8.1)

## 2018-05-11 LAB — CBG MONITORING, ED: Glucose-Capillary: 159 mg/dL — ABNORMAL HIGH (ref 70–99)

## 2018-05-11 LAB — BRAIN NATRIURETIC PEPTIDE: B NATRIURETIC PEPTIDE 5: 448.5 pg/mL — AB (ref 0.0–100.0)

## 2018-05-11 LAB — TROPONIN I: Troponin I: <0.03 ng/mL (ref <0.03)

## 2018-05-11 MED ORDER — SODIUM CHLORIDE 0.9 % IV SOLN
INTRAVENOUS | Status: DC
  Administered 2018-05-11 – 2018-05-12 (×2): via INTRAVENOUS

## 2018-05-11 MED ORDER — FERROUS SULFATE 325 (65 FE) MG PO TABS
325.0000 mg | ORAL_TABLET | Freq: Every day | ORAL | Status: DC
  Administered 2018-05-12 – 2018-05-26 (×13): 325 mg via ORAL
  Filled 2018-05-11 (×15): qty 1

## 2018-05-11 MED ORDER — NAPHAZOLINE-GLYCERIN 0.012-0.2 % OP SOLN
1.0000 [drp] | Freq: Four times a day (QID) | OPHTHALMIC | Status: DC | PRN
  Filled 2018-05-11: qty 15

## 2018-05-11 MED ORDER — SODIUM CHLORIDE 0.9 % IV SOLN
3.0000 g | Freq: Four times a day (QID) | INTRAVENOUS | Status: AC
  Administered 2018-05-12 – 2018-05-18 (×27): 3 g via INTRAVENOUS
  Filled 2018-05-11 (×28): qty 3

## 2018-05-11 MED ORDER — DORZOLAMIDE HCL-TIMOLOL MAL 2-0.5 % OP SOLN
1.0000 [drp] | Freq: Two times a day (BID) | OPHTHALMIC | Status: DC
  Administered 2018-05-12 – 2018-05-26 (×29): 1 [drp] via OPHTHALMIC
  Filled 2018-05-11: qty 10

## 2018-05-11 MED ORDER — MEMANTINE HCL ER 28 MG PO CP24
28.0000 mg | ORAL_CAPSULE | Freq: Every day | ORAL | Status: DC
  Administered 2018-05-12 – 2018-05-26 (×14): 28 mg via ORAL
  Filled 2018-05-11 (×17): qty 1

## 2018-05-11 MED ORDER — ENOXAPARIN SODIUM 40 MG/0.4ML ~~LOC~~ SOLN
40.0000 mg | Freq: Every day | SUBCUTANEOUS | Status: DC
  Administered 2018-05-11 – 2018-05-12 (×2): 40 mg via SUBCUTANEOUS
  Filled 2018-05-11 (×2): qty 0.4

## 2018-05-11 MED ORDER — SIMVASTATIN 5 MG PO TABS
5.0000 mg | ORAL_TABLET | Freq: Every day | ORAL | Status: DC
  Administered 2018-05-12 – 2018-05-25 (×12): 5 mg via ORAL
  Filled 2018-05-11 (×16): qty 1

## 2018-05-11 MED ORDER — DONEPEZIL HCL 23 MG PO TABS
23.0000 mg | ORAL_TABLET | Freq: Every day | ORAL | Status: DC
  Administered 2018-05-12 – 2018-05-26 (×14): 23 mg via ORAL
  Filled 2018-05-11 (×16): qty 1

## 2018-05-11 MED ORDER — SODIUM CHLORIDE 0.9 % IV SOLN
3.0000 g | Freq: Once | INTRAVENOUS | Status: AC
  Administered 2018-05-11: 3 g via INTRAVENOUS
  Filled 2018-05-11: qty 3

## 2018-05-11 MED ORDER — BUPROPION HCL ER (SR) 100 MG PO TB12
100.0000 mg | ORAL_TABLET | Freq: Every day | ORAL | Status: DC
  Administered 2018-05-12 – 2018-05-26 (×14): 100 mg via ORAL
  Filled 2018-05-11 (×16): qty 1

## 2018-05-11 MED ORDER — DOCUSATE SODIUM 100 MG PO CAPS
100.0000 mg | ORAL_CAPSULE | Freq: Every day | ORAL | Status: DC
  Administered 2018-05-12 – 2018-05-26 (×11): 100 mg via ORAL
  Filled 2018-05-11 (×11): qty 1

## 2018-05-11 MED ORDER — INSULIN ASPART 100 UNIT/ML ~~LOC~~ SOLN
0.0000 [IU] | SUBCUTANEOUS | Status: DC
  Administered 2018-05-11 – 2018-05-12 (×2): 2 [IU] via SUBCUTANEOUS
  Administered 2018-05-12 – 2018-05-13 (×3): 1 [IU] via SUBCUTANEOUS
  Administered 2018-05-13 – 2018-05-14 (×3): 2 [IU] via SUBCUTANEOUS
  Administered 2018-05-14: 3 [IU] via SUBCUTANEOUS
  Administered 2018-05-14: 5 [IU] via SUBCUTANEOUS
  Administered 2018-05-15: 2 [IU] via SUBCUTANEOUS
  Administered 2018-05-15: 1 [IU] via SUBCUTANEOUS
  Administered 2018-05-15 (×2): 2 [IU] via SUBCUTANEOUS
  Administered 2018-05-15: 3 [IU] via SUBCUTANEOUS

## 2018-05-11 NOTE — ED Triage Notes (Signed)
Pt arrives to ED from [facility] where staff sent him here because they think he still has pneumonia. Pt was recently diagnosed with pneumonia and finished his antibiotics today. Pt currently at baseline per staff.

## 2018-05-11 NOTE — H&P (Signed)
History and Physical    Jose [name] XHB:[id number] DOB: 1930/12/21 DOA: 05/11/2018  PCP: [name], MD  Patient coming from: SNF  I have personally briefly reviewed patient's old medical records in [facility]  Chief Complaint: SOB  HPI: Jose [name] is a 85 y.o. male with medical history significant of advanced dementia, DM2, CHF, HTN.  Patient was admitted for PNA on 1/25, recorded as aspiration PNA though RVP came back positive for RSV.  Discharged 1/29 and readmitted one day later on 1/30 with recurrent acute resp failure with hypoxia suspected to be due to recurrent aspiration.  Pal care involved in patients care, family still wants full code.  Discharged again on 2/3 on Dysphagia 1 diet, thin liquids.  Advanced to dysphagia 3 diet with nectar thick liquids at SNF.  Finished ABx for PNA yesterday and was doing well yesterday.  Today ate breakfast when wife wasn't there at SNF.  Noted to be more ill and SOB in the afternoon and brought in to ED.  ED Course: WBC 15K, CXR shows R>L basilar opacities.  Remains on his 24/7 baseline 4L O2 via [place].  Continues to have productive cough which has been ongoing since first admission per family.  Started on Unasyn and hospitalist asked to admit for presumed aspiration PNA.   Review of Systems: Unable to perform due to dementia.  Past Medical History:  Diagnosis Date  . Abnormality of gait   . Backache, unspecified   . CHF (congestive heart failure) ([place])   . Colon polyps    adenomatous  . Dementia ([place]) 11/12/2012  . Depression   . Diabetes mellitus without complication ([place])   . Diverticulosis   . Essential and other specified forms of tremor   . Hemorrhoids   . History of bilateral hip replacements 11/12/2012  . Memory loss   . 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 Pain in joint, pelvic region and thigh   . Skin cancer of scalp   . Spinal stenosis, lumbar region, without  neurogenic claudication   . Unspecified hereditary and idiopathic peripheral neuropathy     Past Surgical History:  Procedure Laterality Date  . CATARACT EXTRACTION, BILATERAL  2012  . JOINT REPLACEMENT    . RETINAL LASER PROCEDURE  2012   retinal wrinkle  . SKIN CANCER EXCISION    . TOTAL HIP ARTHROPLASTY Left 2000  . TOTAL HIP ARTHROPLASTY (aka REPLACEMENT) Right 2011     reports that he has quit smoking. His smoking use included cigarettes. He has never used smokeless tobacco. He reports current alcohol use of about 14.0 standard drinks of alcohol per week. He reports that he does not use drugs.  Allergies  Allergen Reactions  . Axona [Bacid] Other (See Comments)    Unknown per MAR  . Gabapentin Other (See Comments)    Hallucinations    Family History  Problem Relation Age of Onset  . Heart failure Mother      Prior to Admission medications   Medication Sig Start Date End Date Taking? Authorizing Provider  buPROPion (WELLBUTRIN SR) 100 MG 12 hr tablet Take 100 mg by mouth daily. 11/23/16  Yes [provider]  carvedilol (COREG) 3.125 MG tablet Take 1 tablet (3.125 mg total) by mouth 2 (two) times daily with a meal. 11/30/16  Yes Theodis Blaze, MD  Cholecalciferol (VITAMIN D) 1000 UNITS capsule Take 1,000 Units by mouth daily.     Yes [provider]  docusate sodium (COLACE) 100 MG capsule Take 100 mg by mouth daily.   Yes [provider]  donepezil (ARICEPT) 23 MG TABS tablet Take 1 tablet (23 mg total) by mouth daily. 10/14/17  Yes Ward Givens, NP  dorzolamide-timolol (COSOPT) 22.3-6.8 MG/ML ophthalmic solution Place 1 drop into the left eye 2 (two) times daily.  09/20/12  Yes [provider]  ferrous sulfate 325 (65 FE) MG tablet Take 325 mg by mouth daily with breakfast.   Yes [provider]  Flaxseed, Linseed, (FLAXSEED OIL) 1000 MG CAPS Take 1,000 mg by mouth daily.     Yes [provider]  folic acid (FOLVITE)  378 MCG tablet Take 800 mcg by mouth every evening.    Yes [provider]  furosemide (LASIX) 20 MG tablet Take 1 tablet (20 mg total) by mouth daily. 05/05/18  Yes Roney Jaffe, MD  insulin glargine (LANTUS) 100 UNIT/ML injection Inject 10 Units into the skin at bedtime.    Yes [provider]  lisinopril (PRINIVIL,ZESTRIL) 5 MG tablet Take 1 tablet (5 mg total) by mouth daily. 12/01/16  Yes Theodis Blaze, MD  memantine (NAMENDA XR) 28 MG CP24 24 hr capsule Take 1 capsule (28 mg total) by mouth daily. 10/14/17  Yes Ward Givens, NP  metFORMIN (GLUCOPHAGE) 500 MG tablet Take 500 mg by mouth 2 (two) times daily with a meal.   Yes [provider]  Multiple Vitamin (MULTIVITAMIN) tablet Take 1 tablet by mouth daily.     Yes [provider]  Probiotic Product (ALIGN) 4 MG CAPS Take 4 mg by mouth daily.   Yes [provider]  simvastatin (ZOCOR) 5 MG tablet Take 5 mg by mouth daily.   Yes [provider]  sitaGLIPtin (JANUVIA) 25 MG tablet Take 1 tablet (25 mg total) by mouth daily. 01/08/18  Yes [name], [name], NP    Physical Exam: Vitals:   05/11/18 1845 05/11/18 1900 05/11/18 1915 05/11/18 1945  BP:  117/60 109/67 110/60  Pulse: (!) 110  (!) 107 99  Resp: 20 (!) 23 (!) 25 (!) 27  Temp:      TempSrc:      SpO2: 100%  100% 100%    Constitutional: NAD, calm, ill appearing Eyes: PERRL, mild erythema and irritation around R eye.  Denies changes in vision ENMT: Mucous membranes are moist. Posterior pharynx clear of any exudate or lesions.Normal dentition.  Neck: normal, supple, no masses, no thyromegaly Respiratory: Tachypnea present Cardiovascular: Tachycardia Abdomen: no tenderness, no masses palpated. No hepatosplenomegaly. Bowel sounds positive.  Musculoskeletal: no clubbing / cyanosis. No joint deformity upper and lower extremities. Good ROM, no contractures. Normal muscle tone.  Skin: no rashes, lesions, ulcers. No  induration Neurologic: MAE, not following commands Psychiatric: Briefly answers direct questions   Labs on Admission: I have personally reviewed following labs and imaging studies  CBC: Recent Labs  Lab 05/11/18 1626  WBC 15.6*  NEUTROABS 13.2*  HGB 7.6*  HCT 26.0*  MCV 91.2  PLT 588   Basic Metabolic Panel: Recent Labs  Lab 05/11/18 1626  NA 136  K 4.8  CL 104  CO2 21*  GLUCOSE 256*  BUN 25*  CREATININE 1.15  CALCIUM 8.3*   GFR: Estimated Creatinine Clearance: 51.5 mL/min (by C-G formula based on SCr of 1.15 mg/dL). Liver Function Tests: Recent Labs  Lab 05/11/18 1626  AST 37  ALT 27  ALKPHOS 86  BILITOT 0.5  PROT 5.4*  ALBUMIN  2.5*   No results for input(s): LIPASE, AMYLASE in the last 168 hours. No results for input(s): AMMONIA in the last 168 hours. Coagulation Profile: No results for input(s): INR, PROTIME in the last 168 hours. Cardiac Enzymes: Recent Labs  Lab 05/11/18 1626  TROPONINI <0.03   BNP (last 3 results) No results for input(s): PROBNP in the last 8760 hours. HbA1C: No results for input(s): HGBA1C in the last 72 hours. CBG: Recent Labs  Lab 05/04/18 2357 05/05/18 0405 05/05/18 0802 05/05/18 1103  GLUCAP 152* 125* 139* 233*   Lipid Profile: No results for input(s): CHOL, HDL, LDLCALC, TRIG, CHOLHDL, LDLDIRECT in the last 72 hours. Thyroid Function Tests: No results for input(s): TSH, T4TOTAL, FREET4, T3FREE, THYROIDAB in the last 72 hours. Anemia Panel: No results for input(s): VITAMINB12, FOLATE, FERRITIN, TIBC, IRON, RETICCTPCT in the last 72 hours. Urine analysis:    Component Value Date/Time   COLORURINE YELLOW 05/01/2018 1109   APPEARANCEUR CLEAR 05/01/2018 1109   LABSPEC 1.011 05/01/2018 1109   PHURINE 6.0 05/01/2018 1109   GLUCOSEU NEGATIVE 05/01/2018 1109   HGBUR LARGE (A) 05/01/2018 1109   BILIRUBINUR NEGATIVE 05/01/2018 1109   KETONESUR NEGATIVE 05/01/2018 1109   PROTEINUR NEGATIVE 05/01/2018 1109    UROBILINOGEN 0.2 06/24/2014 2112   NITRITE NEGATIVE 05/01/2018 1109   LEUKOCYTESUR NEGATIVE 05/01/2018 1109    Radiological Exams on Admission: Dg Chest 2 View  Result Date: 05/11/2018 CLINICAL DATA:  Possible pneumonia EXAM: CHEST - 2 VIEW COMPARISON:  Chest radiograph 05/01/2018 FINDINGS: Monitoring leads overlie the patient. Stable cardiac and mediastinal contours. Low lung volumes. Bibasilar heterogeneous pulmonary opacities. No pleural effusion or pneumothorax. Thoracic spine degenerative changes. Elevation right hemidiaphragm. IMPRESSION: Right-greater-than-left basilar opacities favored to represent atelectasis. Electronically Signed   By: Lovey Newcomer M.D.   On: 05/11/2018 17:15    EKG: Independently reviewed.  Assessment/Plan Principal Problem:   Aspiration pneumonia (HCC) Active Problems:   Alzheimer disease ([place])   Type 2 diabetes mellitus with diabetic chronic kidney disease (HCC)   Chronic combined systolic and diastolic congestive heart failure (HCC)   Oropharyngeal dysphagia   HTN (hypertension)   Eye irritation    1. PNA - 1. suspicious for ongoing aspiration, 3rd admit for PNA in the past couple of weeks. 1. Although with that said, RSV (diagnosed on RVP 04/26/2018) is not what I would consider a typical aspiration organism. 2. See various notes from SLP evals thus far 2. PNA pathway 3. unasyn 4. IVF: NS at 100 5. Cultures pending 6. NPO pending SLP re-eval in AM 2. Advanced dementia - 1. Will re-involve pal care this admit 3. HTN - holding home BP meds since BP borderline at the moment 4. Chronic CHF - Holding lasix for the moment 5. DM2 - 1. Holding home meds 2. Sensitive SSI Q4H 6. Dysphagia - NPO pending SLP re-eval in AM. 7. R eye irritation - clear eyes  DVT prophylaxis: Lovenox Code Status: Full Family Communication: Family at bedside Disposition Plan: SNF after admit Consults called: Pal care consult put into Epic Admission status: Admit to  inpatient  Severity of Illness: The appropriate patient status for this patient is INPATIENT. Inpatient status is judged to be reasonable and necessary in order to provide the required intensity of service to ensure the patient's safety. The patient's presenting symptoms, physical exam findings, and initial radiographic and laboratory data in the context of their chronic comorbidities is felt to place them at high risk for further clinical deterioration. Furthermore, it is  not anticipated that the patient will be medically stable for discharge from the hospital within 2 midnights of admission. The following factors support the patient status of inpatient.   " The patient's presenting symptoms include SOB. " The worrisome physical exam findings include dyspnea, tachycardia. " The initial radiographic and laboratory data are worrisome because of WBC 15k, PNA on CXR. " The chronic co-morbidities include advanced dementia, aspiration syndrome.   * I certify that at the point of admission it is my clinical judgment that the patient will require inpatient hospital care spanning beyond 2 midnights from the point of admission due to high intensity of service, high risk for further deterioration and high frequency of surveillance required.*    ,  M. DO [facility]  How to contact the [facility] Attending or Consulting provider [place] or covering provider during after hours [place], for this patient?  1. Check the care team in [facility] and look for a) attending/consulting [facility] provider listed and b) the [facility] team listed 2. Log into [email or web address]  Amion Physician Scheduling and messaging for groups and whole hospitals  On call and physician scheduling software for group practices, residents, hospitalists and other medical providers for call, clinic, rotation and shift schedules. On[employer] is a hospital-wide system for scheduling doctors and paging doctors on call. EasyPlot is for scientific  plotting and data analysis.  [email or web address]  and use [place]'s universal password to access. If you do not have the password, please contact the hospital operator.  3. Locate the [facility] provider you are looking for under [facility] and page to a number that you can be directly reached. 4. If you still have difficulty reaching the provider, please page the [facility] (Director on Call) for the Hospitalists listed on amion for assistance.  05/11/2018, 8:40 PM

## 2018-05-11 NOTE — Progress Notes (Signed)
Pharmacy Antibiotic Note  DAVONN [name] is a 85 y.o. male admitted on 05/11/2018 with aspiration pneumonia.  Pharmacy has been consulted for Unasyn dosing.  WBC 15.6, AF, SOB.   SCr 1.15, CrCl ~51 mL/min  Plan: Unasyn 3g IV every 6 hours Monitor renal function, Cx and clinical progression to narrow     Temp (24hrs), Avg:98.8 F (37.1 C), Min:98.8 F (37.1 C), Max:98.8 F (37.1 C)  Recent Labs  Lab 05/11/18 1626  WBC 15.6*  CREATININE 1.15    Estimated Creatinine Clearance: 51.5 mL/min (by C-G formula based on SCr of 1.15 mg/dL).    Allergies  Allergen Reactions  . Axona [Bacid] Other (See Comments)    Unknown per MAR  . Gabapentin Other (See Comments)    Hallucinations    Antimicrobials this admission: Unasyn 2/9>>  Dose adjustments this admission: n/a  Microbiology results: 2/9 BCx: sent 2/9 sputum Cx: sent   Bertis Ruddy, PharmD Clinical Pharmacist Please check AMION for all [place] numbers 05/11/2018 8:13 PM

## 2018-05-11 NOTE — ED Provider Notes (Signed)
[name]MENT Provider Note   CSN: [id number] Arrival date & time: 05/11/18  1539     History   Chief Complaint Chief Complaint  Patient presents with  . Pneumonia    HPI Jose [name] is a 85 y.o. male.  HPI Patient presents from his nursing facility due to staff concerns of possible pneumonia. Patient has dementia, does speak occasionally, but has no capacity to provide details of his HPI. Level 5 caveat. Patient answers questions with very brief responses, somewhat inconsistently, seemingly denying pain, acknowledging dyspnea, but when asked where he is he states "I am right here." Reportedly the patient was recently diagnosed with pneumonia and completed his antibiotic therapy earlier today. However, per EMS staff believes the patient may still have pneumonia. No reported new fever, no change in his home oxygen use which is 24 7 4  L via nasal cannula. No report of vomiting, change in interactivity EMS reports the patient was unremarkable in route.  Past Medical History:  Diagnosis Date  . Abnormality of gait   . Backache, unspecified   . CHF (congestive heart failure) ([place])   . Colon polyps    adenomatous  . Dementia ([place]) 11/12/2012  . Depression   . Diabetes mellitus without complication ([place])   . Diverticulosis   . Essential and other specified forms of tremor   . Hemorrhoids   . History of bilateral hip replacements 11/12/2012  . Memory loss   . 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 Pain in joint, pelvic region and thigh   . Skin cancer of scalp   . Spinal stenosis, lumbar region, without neurogenic claudication   . Unspecified hereditary and idiopathic peripheral neuropathy     Patient Active Problem List   Diagnosis Date Noted  . Goals of care, counseling/discussion   . Palliative care by specialist   . CHF (congestive heart failure) ([place]) 05/01/2018  . Acute respiratory failure  with hypoxia ([place]) 05/01/2018  . Aspiration pneumonia ([place]) 04/26/2018  . Constipation 04/15/2018  . HTN (hypertension) 04/15/2018  . Iron deficiency anemia 04/08/2018  . Actinic keratosis 06/21/2017  . Moderate protein-calorie malnutrition ([place]) 03/06/2017  . Unsteady gait 03/06/2017  . Urinary incontinence 12/31/2016  . Oropharyngeal dysphagia 12/31/2016  . Dermatitis, seborrheic 12/14/2016  . Edema 12/14/2016  . Hyperlipidemia associated with type 2 diabetes mellitus ([place]) 12/06/2016  . Functional urinary incontinence 12/06/2016  . Chronic bilateral low back pain with bilateral sciatica 12/06/2016  . Chronic combined systolic and diastolic congestive heart failure ([place]) 12/06/2016  . Major depression, recurrent, chronic ([place]) 11/23/2016  . OAB (overactive bladder) 11/23/2016  . Dermatitis 08/23/2016  . External hemorrhoid 07/26/2016  . Depression with anxiety 07/09/2016  . Thrombocytopenia ([employer]) 06/28/2016  . Closed left hip fracture, sequela 06/22/2016  . Type 2 diabetes mellitus with diabetic chronic kidney disease ([place]) 06/22/2016  . Hyperlipidemia LDL goal <70 06/22/2016  . Alzheimer disease ([place]) 11/12/2012  . History of bilateral hip replacements 11/12/2012    Past Surgical History:  Procedure Laterality Date  . CATARACT EXTRACTION, BILATERAL  2012  . JOINT REPLACEMENT    . RETINAL LASER PROCEDURE  2012   retinal wrinkle  . SKIN CANCER EXCISION    . TOTAL HIP ARTHROPLASTY Left 2000  . TOTAL HIP ARTHROPLASTY (aka REPLACEMENT) Right 2011        Home Medications    Prior to Admission medications   Medication Sig Start Date End Date Taking? Authorizing  Provider  buPROPion (WELLBUTRIN SR) 100 MG 12 hr tablet Take 100 mg by mouth daily. 11/23/16  Yes [provider]  carvedilol (COREG) 3.125 MG tablet Take 1 tablet (3.125 mg total) by mouth 2 (two) times daily with a meal. 11/30/16  Yes Theodis Blaze, MD  Cholecalciferol (VITAMIN D) 1000 UNITS capsule Take  1,000 Units by mouth daily.     Yes [provider]  docusate sodium (COLACE) 100 MG capsule Take 100 mg by mouth daily.   Yes [provider]  donepezil (ARICEPT) 23 MG TABS tablet Take 1 tablet (23 mg total) by mouth daily. 10/14/17  Yes Ward Givens, NP  dorzolamide-timolol (COSOPT) 22.3-6.8 MG/ML ophthalmic solution Place 1 drop into the left eye 2 (two) times daily.  09/20/12  Yes [provider]  ferrous sulfate 325 (65 FE) MG tablet Take 325 mg by mouth daily with breakfast.   Yes [provider]  Flaxseed, Linseed, (FLAXSEED OIL) 1000 MG CAPS Take 1,000 mg by mouth daily.     Yes [provider]  folic acid (FOLVITE) 557 MCG tablet Take 800 mcg by mouth every evening.    Yes [provider]  furosemide (LASIX) 20 MG tablet Take 1 tablet (20 mg total) by mouth daily. 05/05/18  Yes Roney Jaffe, MD  insulin glargine (LANTUS) 100 UNIT/ML injection Inject 10 Units into the skin at bedtime.    Yes [provider]  lisinopril (PRINIVIL,ZESTRIL) 5 MG tablet Take 1 tablet (5 mg total) by mouth daily. 12/01/16  Yes Theodis Blaze, MD  memantine (NAMENDA XR) 28 MG CP24 24 hr capsule Take 1 capsule (28 mg total) by mouth daily. 10/14/17  Yes Ward Givens, NP  metFORMIN (GLUCOPHAGE) 500 MG tablet Take 500 mg by mouth 2 (two) times daily with a meal.   Yes [provider]  Multiple Vitamin (MULTIVITAMIN) tablet Take 1 tablet by mouth daily.     Yes [provider]  Probiotic Product (ALIGN) 4 MG CAPS Take 4 mg by mouth daily.   Yes [provider]  simvastatin (ZOCOR) 5 MG tablet Take 5 mg by mouth daily.   Yes [provider]  sitaGLIPtin (JANUVIA) 25 MG tablet Take 1 tablet (25 mg total) by mouth daily. 01/08/18  Yes [name], Dinah C, NP    Family History Family History  Problem Relation Age of Onset  . Heart failure Mother     Social History Social History   Tobacco Use  . Smoking status:  Former Smoker    Types: Cigarettes  . Smokeless tobacco: Never Used  Substance Use Topics  . Alcohol use: Yes    Alcohol/week: 14.0 standard drinks    Types: 14 Glasses of wine per week    Comment: occas, one glass of wine before dinner,sometimes 1/2 glass  . Drug use: No     Allergies   Axona [bacid] and Gabapentin   Review of Systems Review of Systems  Unable to perform ROS: Dementia     Physical Exam Updated Vital Signs BP 117/60   Pulse (!) 110   Temp 98.8 F (37.1 C) (Oral)   Resp (!) 23   SpO2 100%   Physical Exam Vitals signs and nursing note reviewed.  Constitutional:      General: He is not in acute distress.    Appearance: He is well-developed.     Comments: Sickly appearing elderly male resting with his eyes closed, he opens them briefly, responds quickly to direct questions, again  closes his eyes, and is withdrawn.  HENT:     Head: Normocephalic and atraumatic.  Eyes:     Conjunctiva/sclera: Conjunctivae normal.  Cardiovascular:     Rate and Rhythm: Regular rhythm. Tachycardia present.  Pulmonary:     Effort: Tachypnea present. No respiratory distress.     Breath sounds: No stridor. No wheezing.  Abdominal:     General: There is no distension.  Skin:    General: Skin is warm and dry.  Neurological:     Mental Status: Mental status is at baseline. He is disoriented.     Comments: Patient does move all extremities spontaneously, minimally, does not follow commands reliably, has no gross facial asymmetry, speech is inconsistent but clear.  Psychiatric:        Cognition and Memory: Cognition is impaired.      ED Treatments / Results  Labs (all labs ordered are listed, but only abnormal results are displayed) Labs Reviewed  COMPREHENSIVE METABOLIC PANEL - Abnormal; Notable for the following components:      Result Value   CO2 21 (*)    Glucose, Bld 256 (*)    BUN 25 (*)    Calcium 8.3 (*)    Total Protein 5.4 (*)    Albumin 2.5 (*)    GFR  calc non Af Amer 58 (*)    All other components within normal limits  CBC WITH DIFFERENTIAL/PLATELET - Abnormal; Notable for the following components:   WBC 15.6 (*)    RBC 2.85 (*)    Hemoglobin 7.6 (*)    HCT 26.0 (*)    MCHC 29.2 (*)    RDW 20.0 (*)    Neutro Abs 13.2 (*)    Monocytes Absolute 1.2 (*)    Abs Immature Granulocytes 0.09 (*)    All other components within normal limits  BRAIN NATRIURETIC PEPTIDE - Abnormal; Notable for the following components:   B Natriuretic Peptide 448.5 (*)    All other components within normal limits  TROPONIN I    EKG EKG Interpretation  Date/Time:  Sunday May 11 2018 15:45:32 EST Ventricular Rate:  102 PR Interval:    QRS Duration: 103 QT Interval:  378 QTC Calculation: 493 R Axis:   -25 Text Interpretation:  Sinus tachycardia Inferior infarct, old Baseline wander Artifact Abnormal ekg Confirmed by Carmin Muskrat [phone]) on 05/11/2018 3:50:53 PM   Radiology Dg Chest 2 View  Result Date: 05/11/2018 CLINICAL DATA:  Possible pneumonia EXAM: CHEST - 2 VIEW COMPARISON:  Chest radiograph 05/01/2018 FINDINGS: Monitoring leads overlie the patient. Stable cardiac and mediastinal contours. Low lung volumes. Bibasilar heterogeneous pulmonary opacities. No pleural effusion or pneumothorax. Thoracic spine degenerative changes. Elevation right hemidiaphragm. IMPRESSION: Right-greater-than-left basilar opacities favored to represent atelectasis. Electronically Signed   By: Lovey Newcomer M.D.   On: 05/11/2018 17:15    Procedures Procedures (including critical care time)  Medications Ordered in ED Medications  Ampicillin-Sulbactam (UNASYN) 3 g in sodium chloride 0.9 % 100 mL IVPB (has no administration in time range)     Initial Impression / Assessment and Plan / ED Course  I have reviewed the triage vital signs and the nursing notes.  Pertinent labs & imaging results that were available during my care of the patient were reviewed by me and  considered in my medical decision making (see chart for details).    After the initial evaluation reviewed the patient's chart, and nursing home paper provided by EMS. Findings notable for recent diagnosis of pneumonia,  chronic conditions including diabetes, dementia. 7:26 PM On repeat exam patient is in similar condition, with persistent tachycardia, mild hypotension. Patient's labs notable for elevated leukocytosis, elevated absolute immature granulocyte count, which is sometimes consistent with infection, and elevated BNP. X-ray reviewed, concerning for persistent pneumonia, and given his worsening condition, tachycardia, hypotension, the patient will start antibiotics again. Family advised discussed possibilities for his pneumonia, including strong consideration of recurrent aspiration. On chart review is clear that the patient had clinical signs and symptoms of this during his recent hospitalization, and family spoke with both hospitalist and hospice care during that hospitalization about realistic expectations. Family aware of all findings thus far, concern for pneumonia, initiation of antibiotics, to which they prefer, patient admitted for further monitoring, management.  Final Clinical Impressions(s) / ED Diagnoses  Pneumonia   Carmin Muskrat, MD 05/11/18 [phone]

## 2018-05-11 NOTE — ED Notes (Signed)
Patient transported to X-ray 

## 2018-05-12 LAB — GLUCOSE, CAPILLARY
GLUCOSE-CAPILLARY: 125 mg/dL — AB (ref 70–99)
GLUCOSE-CAPILLARY: 154 mg/dL — AB (ref 70–99)
Glucose-Capillary: 111 mg/dL — ABNORMAL HIGH (ref 70–99)
Glucose-Capillary: 146 mg/dL — ABNORMAL HIGH (ref 70–99)

## 2018-05-12 LAB — CBG MONITORING, ED: GLUCOSE-CAPILLARY: 123 mg/dL — AB (ref 70–99)

## 2018-05-12 LAB — HIV ANTIBODY (ROUTINE TESTING W REFLEX): HIV Screen 4th Generation wRfx: NONREACTIVE

## 2018-05-12 LAB — PROCALCITONIN: Procalcitonin: 0.15 ng/mL

## 2018-05-12 MED ORDER — RESOURCE THICKENUP CLEAR PO POWD
ORAL | Status: DC | PRN
  Filled 2018-05-12: qty 125

## 2018-05-12 MED ORDER — SODIUM CHLORIDE 0.9 % IV SOLN
INTRAVENOUS | Status: DC
  Administered 2018-05-12 – 2018-05-13 (×2): via INTRAVENOUS

## 2018-05-12 NOTE — ED Notes (Signed)
O2 tank replaced x 3.

## 2018-05-12 NOTE — Consult Note (Signed)
Consultation Note Date: 05/12/2018   Patient Name: Jose [name]  DOB: 1930-08-08  MRN: [id number]  Age / Sex: 85 y.o., male  PCP: [name], MD Referring Physician: Bonnell Public, MD  Reason for Consultation: Establishing goals of care and Psychosocial/spiritual support  HPI/Patient Profile: 85 y.o. male   admitted on 05/11/2018 with past medical history significant of advanced dementia, DM2, CHF, HTN.  Patient was admitted for PNA on 1/25, recorded as aspiration PNA though RVP came back positive for RSV.  Discharged 1/29 and readmitted one day later on 1/30 with recurrent acute resp failure with hypoxia suspected to be due to recurrent aspiration.  Continued physical, functional and cognitive decline over the past several months.  Family face advanced directive decisions, advanced directive decisions and anticipatory care needs.   Clinical Assessment and Goals of Care:  This NP Wadie Lessen reviewed medical records, received report from team, assessed the patient and then meet at the patient's bedside along with his wife and daughter  to discuss diagnosis, prognosis, GOC, EOL wishes disposition and options.  Concept of Hospice and Palliative Care were discussed  A detailed discussion was had today regarding advanced directives.  Concepts specific to code status, artifical feeding and hydration, continued IV antibiotics and rehospitalization was had.  The difference between a aggressive medical intervention path  and a palliative comfort care path for this patient at this time was had.  Values and goals of care important to patient and family were attempted to be elicited.  MOST form introduced  Natural trajectory and expectations at EOL were discussed.  Questions and concerns addressed.   Family encouraged to call with questions or concerns.    PMT will continue to support  holistically.    HCPOAdocuments are scanned into EMR    SUMMARY OF RECOMMENDATIONS    Code Status/Advance Care Planning:  Full code   Encouraged family to consider DNR/DNI status knowing poor outcomes in similar patients    Symptom Management:   Dysphagia/high risk of aspiration: family agree with speech therapy recommendation  Wife verbalizes at this time she would not consider feeding tube  Palliative Prophylaxis:   Aspiration, Bowel Regimen, Delirium Protocol and Oral Care  Additional Recommendations (Limitations, Scope, Preferences):  Full Scope Treatment  Psycho-social/Spiritual:   Desire for further Chaplaincy support:no  Additional Recommendations: Education on Hospice   Created space and opportunity for family to express their thoughts and feelings regarding current medical situation of Mr Egolf and the  role of  Caregiver.   Prognosis:   < 12 months  Discharge Planning: To Be Determined      Primary Diagnoses: Present on Admission: . Aspiration pneumonia ([place]) . Alzheimer disease ([place]) . Chronic combined systolic and diastolic congestive heart failure ([place]) . HTN (hypertension) . Oropharyngeal dysphagia . Type 2 diabetes mellitus with diabetic chronic kidney disease ([place]) . Eye irritation   I have reviewed the medical record, interviewed the patient and family, and examined the patient. The following aspects are pertinent.  Past Medical History:  Diagnosis Date  . Abnormality of gait   . Backache, unspecified   . CHF (congestive heart failure) ([place])   . Colon polyps    adenomatous  . Dementia ([place]) 11/12/2012  . Depression   . Diabetes mellitus without complication ([place])   . Diverticulosis   . Essential and other specified forms of tremor   . Hemorrhoids   . History of bilateral hip replacements 11/12/2012  . Memory loss   . 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 Pain in joint, pelvic region and  thigh   . Skin cancer of scalp   . Spinal stenosis, lumbar region, without neurogenic claudication   . Unspecified hereditary and idiopathic peripheral neuropathy    Social History   Socioeconomic History  . Marital status: Married    Spouse name: [name]  . Number of children: 2  . Years of education: Chief Executive Officer  . Highest education level: Not on file  Occupational History  . 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 Financial resource strain: Not hard at all  . Food insecurity:    Worry: Never true    Inability: Never true  . Transportation needs:    Medical: No    Non-medical: No  Tobacco Use  . Smoking status: Former Smoker    Types: Cigarettes  . Smokeless tobacco: Never Used  Substance and Sexual Activity  . Alcohol use: Yes    Alcohol/week: 14.0 standard drinks    Types: 14 Glasses of wine per week    Comment: occas, one glass of wine before dinner,sometimes 1/2 glass  . Drug use: No  . Sexual activity: Never  Lifestyle  . Physical activity:    Days per week: 0 days    Minutes per session: 0 min  . Stress: Only a little  Relationships  . Social connections:    Talks on phone: Never    Gets together: More than three times a week    Attends religious service: Never    Active member of club or organization: No    Attends meetings of clubs or organizations: Never    Relationship status: Married  Other Topics Concern  . Not on file  Social History Narrative   Patient is right handed and resides in home with wife   Family History  Problem Relation Age of Onset  . Heart failure Mother    Scheduled Meds: . buPROPion  100 mg Oral Daily  . docusate sodium  100 mg Oral Daily  . donepezil  23 mg Oral Daily  . dorzolamide-timolol  1 drop Left Eye BID  . enoxaparin (LOVENOX) injection  40 mg Subcutaneous QHS  . ferrous sulfate  325 mg Oral Q breakfast  . insulin aspart  0-9 Units Subcutaneous Q4H  . memantine  28 mg  Oral Daily  . simvastatin  5 mg Oral q1800   Continuous Infusions: . sodium chloride 50 mL/hr at 05/12/18 1325  . ampicillin-sulbactam (UNASYN) IV 3 g (05/12/18 1327)   PRN Meds:.naphazoline-glycerin, RESOURCE THICKENUP CLEAR Medications Prior to Admission:  Prior to Admission medications   Medication Sig Start Date End Date Taking? Authorizing Provider  buPROPion (WELLBUTRIN SR) 100 MG 12 hr tablet Take 100 mg by mouth daily. 11/23/16  Yes [provider]  carvedilol (COREG) 3.125 MG tablet Take 1 tablet (3.125 mg total) by mouth 2 (two) times daily with a meal. 11/30/16  Yes Mart Piggs  M, MD  Cholecalciferol (VITAMIN D) 1000 UNITS capsule Take 1,000 Units by mouth daily.     Yes [provider]  docusate sodium (COLACE) 100 MG capsule Take 100 mg by mouth daily.   Yes [provider]  donepezil (ARICEPT) 23 MG TABS tablet Take 1 tablet (23 mg total) by mouth daily. 10/14/17  Yes Ward Givens, NP  dorzolamide-timolol (COSOPT) 22.3-6.8 MG/ML ophthalmic solution Place 1 drop into the left eye 2 (two) times daily.  09/20/12  Yes [provider]  ferrous sulfate 325 (65 FE) MG tablet Take 325 mg by mouth daily with breakfast.   Yes [provider]  Flaxseed, Linseed, (FLAXSEED OIL) 1000 MG CAPS Take 1,000 mg by mouth daily.     Yes [provider]  folic acid (FOLVITE) 621 MCG tablet Take 800 mcg by mouth every evening.    Yes [provider]  furosemide (LASIX) 20 MG tablet Take 1 tablet (20 mg total) by mouth daily. 05/05/18  Yes Roney Jaffe, MD  insulin glargine (LANTUS) 100 UNIT/ML injection Inject 10 Units into the skin at bedtime.    Yes [provider]  lisinopril (PRINIVIL,ZESTRIL) 5 MG tablet Take 1 tablet (5 mg total) by mouth daily. 12/01/16  Yes Theodis Blaze, MD  memantine (NAMENDA XR) 28 MG CP24 24 hr capsule Take 1 capsule (28 mg total) by mouth daily. 10/14/17  Yes Ward Givens, NP  metFORMIN (GLUCOPHAGE)  500 MG tablet Take 500 mg by mouth 2 (two) times daily with a meal.   Yes [provider]  Multiple Vitamin (MULTIVITAMIN) tablet Take 1 tablet by mouth daily.     Yes [provider]  Probiotic Product (ALIGN) 4 MG CAPS Take 4 mg by mouth daily.   Yes [provider]  simvastatin (ZOCOR) 5 MG tablet Take 5 mg by mouth daily.   Yes [provider]  sitaGLIPtin (JANUVIA) 25 MG tablet Take 1 tablet (25 mg total) by mouth daily. 01/08/18  Yes [name], Dinah C, NP   Allergies  Allergen Reactions  . Axona [Bacid] Other (See Comments)    Unknown per MAR  . Gabapentin Other (See Comments)    Hallucinations   Review of Systems  Unable to perform ROS: Dementia    Physical Exam Constitutional:      General: He is awake.     Appearance: He is underweight.  Cardiovascular:     Rate and Rhythm: Normal rate and regular rhythm.     Heart sounds: Normal heart sounds.  Musculoskeletal:     Comments: generalized weakness and muscle atrophy  Skin:    General: Skin is warm and dry.     Vital Signs: BP (!) 109/55 (BP Location: Right Arm)   Pulse 90   Temp 98.1 F (36.7 C) (Oral)   Resp 20   SpO2 91%  Pain Scale: PAINAD       SpO2: SpO2: 91 % O2 Device:SpO2: 91 % O2 Flow Rate: .O2 Flow Rate (L/min): 4 L/min  IO: Intake/output summary:   Intake/Output Summary (Last 24 hours) at 05/12/2018 1353 Last data filed at 05/12/2018 0516 Gross per 24 hour  Intake 200 ml  Output -  Net 200 ml    LBM: Last BM Date: 05/12/18 Baseline Weight:   Most recent weight:       Palliative Assessment/Data: 30 % at best   Flowsheet Rows     Most Recent Value  Intake Tab  Referral Department  Hospitalist  Unit at  Time of Referral  ER  Palliative Care Primary Diagnosis  Sepsis/Infectious Disease  Date Notified  05/12/18  Palliative Care Type  Return patient Palliative Care  Reason for referral  Clarify Goals of Care  Date of Admission  05/11/18  # of days IP  prior to Palliative referral  1  Clinical Assessment  Psychosocial & Spiritual Assessment  Palliative Care Outcomes      Time In: 1610 Time Out: 1200 Time Total: 75 minutes Greater than 50%  of this time was spent counseling and coordinating care related to the above assessment and plan.  Signed by: Wadie Lessen, NP   Please contact Palliative Medicine Team phone at [phone] for questions and concerns.  For individual provider: See Shea Evans

## 2018-05-12 NOTE — Progress Notes (Signed)
PROGRESS NOTE    Jose [name]  PPJ:[id number] DOB: [birth year] DOA: 05/11/2018 PCP: [name], MD  Outpatient Specialists:     Brief Narrative:  Jose [name] is a 85 y.o. male with medical history significant of advanced dementia, DM2, CHF, HTN.  Patient was admitted for PNA on 1/25, recorded as aspiration PNA though RVP came back positive for RSV.  Discharged 1/29 and readmitted one day later on 1/30 with recurrent acute resp failure with hypoxia suspected to be due to recurrent aspiration.  Patient with discharged again on 05/05/2018 on Dysphagia 1 diet, thin liquids.  Patient was advanced to dysphagia 3 diet with nectar thick liquids at SNF.  Patient finished antibiotics for pneumonia a day prior to presentation, and was doing well.  On the day of presentation, patient ate breakfast when wife wasn't there at SNF.  Noted to be more ill and SOB in the afternoon and brought in to ED. patient has been readmitted for possible recurrent pneumonia.  Patient is still a full code.  Patient may be failing to thrive outside of the hospital environment.  Palliative care team has been reconsulted.  Assessment & Plan:   Principal Problem:   Aspiration pneumonia ([place]) Active Problems:   Alzheimer disease ([place])   Type 2 diabetes mellitus with diabetic chronic kidney disease ([place])   Chronic combined systolic and diastolic congestive heart failure ([facility])   Oropharyngeal dysphagia   HTN (hypertension)   Eye irritation  Possible pneumonia:  Suspicion suspicious for ongoing aspiration, 3rd admit for PNA in the past couple of weeks. -Although with that said, RSV (diagnosed on RVP 04/26/2018) is not what I would consider a typical aspiration organism. -See various notes from SLP evals thus far -PNA pathway -Unasyn -IVF: NS at 100 -Cultures pending -NPO pending SLP re-eval in AM -05/12/2018: Continue antibiotics for now.  Follow speech evaluation.  Check procalcitonin.  Palliative care team..  Further  management will depend on hospital course.  Repeat lab work in the morning.  Advanced dementia - Await palliative care team..  Hypertension:  Home medications on hold.   Blood pressure is low normal for age.    Chronic congestive heart failure: Lasix on hold. Patient is currently on IV fluid. Will decrease the rate of IV fluids to 50 cc an hour.  Diabetes mellitus type 2: -Holding home meds -Patient is currently n.p.o. for now. -Sliding scale insulin coverage.  Dysphagia: - NPO pending SLP re-eval in AM.   DVT prophylaxis: Lovenox Code Status: Full Family Communication:  Disposition Plan: SNF after admit  Consultants:   Palliative care  Procedures:   None  Antimicrobials:   IV Unasyn   Subjective: Patient seen alongside patient's care. No history from patient.  Objective: Vitals:   05/11/18 2100 05/11/18 2115 05/11/18 2145 05/12/18 0541  BP: 105/61 103/64 103/61 (!) 109/55  Pulse: (!) 106 100 (!) 101 90  Resp: 20 18 (!) 24 20  Temp:    98.1 F (36.7 C)  TempSrc:    Oral  SpO2: 100% 100% 100% 91%    Intake/Output Summary (Last 24 hours) at 05/12/2018 0920 Last data filed at 05/12/2018 0516 Gross per 24 hour  Intake 200 ml  Output -  Net 200 ml   There were no vitals filed for this visit.  Examination:  General exam: Appears calm and comfortable  Respiratory system: Clear to auscultation. Respiratory effort normal. Cardiovascular system: S1 & S2 heard. Gastrointestinal system: Abdomen is nondistended, soft and nontender. No  organomegaly or masses felt. Normal bowel sounds heard. Central nervous system: Alert and oriented. No focal neurological deficits. Extremities: No leg edema.  Data Reviewed: I have personally reviewed following labs and imaging studies  CBC: Recent Labs  Lab 05/11/18 1626  WBC 15.6*  NEUTROABS 13.2*  HGB 7.6*  HCT 26.0*  MCV 91.2  PLT 350   Basic Metabolic Panel: Recent Labs  Lab 05/11/18 1626  NA 136  K  4.8  CL 104  CO2 21*  GLUCOSE 256*  BUN 25*  CREATININE 1.15  CALCIUM 8.3*   GFR: Estimated Creatinine Clearance: 51.5 mL/min (by C-G formula based on SCr of 1.15 mg/dL). Liver Function Tests: Recent Labs  Lab 05/11/18 1626  AST 37  ALT 27  ALKPHOS 86  BILITOT 0.5  PROT 5.4*  ALBUMIN 2.5*   No results for input(s): LIPASE, AMYLASE in the last 168 hours. No results for input(s): AMMONIA in the last 168 hours. Coagulation Profile: No results for input(s): INR, PROTIME in the last 168 hours. Cardiac Enzymes: Recent Labs  Lab 05/11/18 1626  TROPONINI <0.03   BNP (last 3 results) No results for input(s): PROBNP in the last 8760 hours. HbA1C: No results for input(s): HGBA1C in the last 72 hours. CBG: Recent Labs  Lab 05/05/18 1103 05/11/18 2159 05/12/18 0522 05/12/18 0832  GLUCAP 233* 159* 123* 111*   Lipid Profile: No results for input(s): CHOL, HDL, LDLCALC, TRIG, CHOLHDL, LDLDIRECT in the last 72 hours. Thyroid Function Tests: No results for input(s): TSH, T4TOTAL, FREET4, T3FREE, THYROIDAB in the last 72 hours. Anemia Panel: No results for input(s): VITAMINB12, FOLATE, FERRITIN, TIBC, IRON, RETICCTPCT in the last 72 hours. Urine analysis:    Component Value Date/Time   COLORURINE YELLOW 05/01/2018 1109   APPEARANCEUR CLEAR 05/01/2018 1109   LABSPEC 1.011 05/01/2018 1109   PHURINE 6.0 05/01/2018 1109   GLUCOSEU NEGATIVE 05/01/2018 1109   HGBUR LARGE (A) 05/01/2018 1109   BILIRUBINUR NEGATIVE 05/01/2018 1109   [place] 05/01/2018 1109   PROTEINUR NEGATIVE 05/01/2018 1109   UROBILINOGEN 0.2 06/24/2014 2112   NITRITE NEGATIVE 05/01/2018 1109   LEUKOCYTESUR NEGATIVE 05/01/2018 1109   Sepsis Labs: @LABRCNTIP (procalcitonin:4,lacticidven:4)  ) Recent Results (from the past 240 hour(s))  Culture, blood (routine x 2) Call MD if unable to obtain prior to antibiotics being given     Status: None (Preliminary result)   Collection Time: 05/11/18  8:30  PM  Result Value Ref Range Status   Specimen Description   Final    BLOOD LEFT ARM Performed at [facility], [place] [address], [place], [place] [address]    Special Requests   Final    BOTTLES DRAWN AEROBIC AND ANAEROBIC Blood Culture results may not be optimal due to an excessive volume of blood received in culture bottles   Culture PENDING  Incomplete   Report Status PENDING  Incomplete         Radiology Studies: Dg Chest 2 View  Result Date: 05/11/2018 CLINICAL DATA:  Possible pneumonia EXAM: CHEST - 2 VIEW COMPARISON:  Chest radiograph 05/01/2018 FINDINGS: Monitoring leads overlie the patient. Stable cardiac and mediastinal contours. Low lung volumes. Bibasilar heterogeneous pulmonary opacities. No pleural effusion or pneumothorax. Thoracic spine degenerative changes. Elevation right hemidiaphragm. IMPRESSION: Right-greater-than-left basilar opacities favored to represent atelectasis. Electronically Signed   By: Lovey Newcomer M.D.   On: 05/11/2018 17:15        Scheduled Meds: . buPROPion  100 mg Oral Daily  . docusate sodium  100 mg Oral Daily  . donepezil  23 mg Oral Daily  . dorzolamide-timolol  1 drop Left Eye BID  . enoxaparin (LOVENOX) injection  40 mg Subcutaneous QHS  . ferrous sulfate  325 mg Oral Q breakfast  . insulin aspart  0-9 Units Subcutaneous Q4H  . memantine  28 mg Oral Daily  . simvastatin  5 mg Oral q1800   Continuous Infusions: . sodium chloride 100 mL/hr at 05/12/18 0850  . ampicillin-sulbactam (UNASYN) IV Stopped (05/12/18 0516)     LOS: 1 day    Time spent: 35 minutes    Dana Allan, MD  [facility] Pager #: [phone] 7PM-7AM contact night coverage as above

## 2018-05-12 NOTE — Evaluation (Signed)
Clinical/Bedside Swallow Evaluation Patient Details  Name: Jose [name] MRN: [id number] Date of Birth: 1930/04/25  Today's Date: 05/12/2018 Time: SLP Start Time (ACUTE ONLY): 5883 SLP Stop Time (ACUTE ONLY): 2549 SLP Time Calculation (min) (ACUTE ONLY): 88 min  Past Medical History:  Past Medical History:  Diagnosis Date  . Abnormality of gait   . Backache, unspecified   . CHF (congestive heart failure) ([place])   . Colon polyps    adenomatous  . Dementia ([place]) 11/12/2012  . Depression   . Diabetes mellitus without complication ([place])   . Diverticulosis   . Essential and other specified forms of tremor   . Hemorrhoids   . History of bilateral hip replacements 11/12/2012  . Memory loss   . 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 Pain in joint, pelvic region and thigh   . Skin cancer of scalp   . Spinal stenosis, lumbar region, without neurogenic claudication   . Unspecified hereditary and idiopathic peripheral neuropathy    Past Surgical History:  Past Surgical History:  Procedure Laterality Date  . CATARACT EXTRACTION, BILATERAL  2012  . JOINT REPLACEMENT    . RETINAL LASER PROCEDURE  2012   retinal wrinkle  . SKIN CANCER EXCISION    . TOTAL HIP ARTHROPLASTY Left 2000  . TOTAL HIP ARTHROPLASTY (aka REPLACEMENT) Right 2011   HPI:  Pt is an 85 yo male admitted with recurrent PNA (third admission since the end of January). CXR showed R>L basilar opacities. Pt had two prior MBS (2018, 2020) that both recommended thin liquids. Most recent MBS 04/29/18 with recommendation for regular/thin with small bolus sizes, self-administered if possible, with second swallow to clear residue. Meds whole in puree. He most recently discharged on Dys 3 diet and thin liquids by cup after BSE 05/03/18, but per MD note he was changed to nectar thick liquids at SNF. PMH: dementia, DM, CHF, PNA   Assessment / Plan / Recommendation Clinical Impression  Pt does not have  overt s/s of aspiration, with clinical presentation of swallow appearing to be consistent with MBS recently completed (although with aspiration silent at that time). His wife and daughter preferred to speak in depth outside of the room, with palliative care joining for some of the conversation as well.  Discussion focused on GOC, current level of function, and risks. Pt was observed to aspirate on most recent MBS only x1, but we discussed how his performance could fluctuate throughout the day and briefly about how it can progress overall. Given the overall decline he has had since his initial fall, he is more susceptible to complications from aspiration, even if it is occurring intermittently or in smaller volumes. His family agrees that oral intake is very important to him, and they would not want to pursue alternative sources of nutrition. They are interested in seeing how nectar thick liquids, which were recommended by his primary SLP, may benefit him, but they also voice concern that he was dehydrated upon admission. In an attempt to increase intake but also reduce the risk of aspiration as much as can be done, they would like to prioritize the times that pt is at his strongest and most alert - typically in the morning. They would like for him to receive thin liquids but with thickened liquids available for when he is more fatigued, such as during his evening meal. Education was provided about risks and precautions, including offering food/liquids when he is wanting it, but not  forcing intake when he is tired or not desiring it. Will place order for Dys 3 diet and thin liquids but will order a can of thickener to be kept in his room. SLP will continue to follow for implementation of precautions and continued education.  SLP Visit Diagnosis: Dysphagia, oropharyngeal phase (R13.12)    Aspiration Risk  Mild aspiration risk;Moderate aspiration risk    Diet Recommendation Dysphagia 3 (Mech soft);Thin  liquid(nectar thick liquids PRN)   Liquid Administration via: Cup Medication Administration: Whole meds with puree Supervision: Full supervision/cueing for compensatory strategies;Patient able to self feed Compensations: Minimize environmental distractions;Small sips/bites;Slow rate;Multiple dry swallows after each bite/sip Postural Changes: Seated upright at 90 degrees    Other  Recommendations Oral Care Recommendations: Oral care BID   Follow up Recommendations Skilled Nursing facility      Frequency and Duration min 2x/week  2 weeks       Prognosis Prognosis for Safe Diet Advancement: Good Barriers to Reach Goals: Cognitive deficits      Swallow Study   General HPI: Pt is an 85 yo male admitted with recurrent PNA (third admission since the end of January). CXR showed R>L basilar opacities. Pt had two prior MBS (2018, 2020) that both recommended thin liquids. Most recent MBS 04/29/18 with recommendation for regular/thin with small bolus sizes, self-administered if possible, with second swallow to clear residue. Meds whole in puree. He most recently discharged on Dys 3 diet and thin liquids by cup after BSE 05/03/18, but per MD note he was changed to nectar thick liquids at SNF. PMH: dementia, DM, CHF, PNA Type of Study: Bedside Swallow Evaluation Previous Swallow Assessment: see HPI Diet Prior to this Study: NPO Temperature Spikes Noted: No Respiratory Status: Nasal cannula History of Recent Intubation: No Behavior/Cognition: Alert;Cooperative Oral Cavity Assessment: Within Functional Limits Oral Care Completed by SLP: No Oral Cavity - Dentition: Adequate natural dentition Vision: Functional for self-feeding Self-Feeding Abilities: Able to feed self Patient Positioning: Upright in bed(although with head tilted) Baseline Vocal Quality: Normal Volitional Cough: Weak Volitional Swallow: Able to elicit    Oral/Motor/Sensory Function Overall Oral Motor/Sensory Function: Within  functional limits   Ice Chips Ice chips: Not tested   Thin Liquid Thin Liquid: Within functional limits Presentation: Cup;Self Fed    Nectar Thick Nectar Thick Liquid: Not tested   Honey Thick Honey Thick Liquid: Not tested   Puree Puree: Within functional limits Presentation: Self Fed;Spoon   Solid     Solid: Within functional limits Presentation: [place] 05/12/2018,12:58 PM   Nuala Alpha, M.A. [place] Acute Environmental education officer [phone] Office [phone]

## 2018-05-12 NOTE — Progress Notes (Signed)
05/12/2018 Patient transfer from the emergency room to 2W at [facility]. He is alert to self. Patient foot is dry, flaky bilateral groin and middle of buttocks have some Moisture Associate Skin Damage. [facility] Rn

## 2018-05-13 ENCOUNTER — Encounter (HOSPITAL_COMMUNITY): Payer: Self-pay | Admitting: Anesthesiology

## 2018-05-13 ENCOUNTER — Inpatient Hospital Stay (HOSPITAL_COMMUNITY): Payer: Medicare Other

## 2018-05-13 DIAGNOSIS — D649 Anemia, unspecified: Secondary | ICD-10-CM

## 2018-05-13 DIAGNOSIS — R195 Other fecal abnormalities: Secondary | ICD-10-CM

## 2018-05-13 DIAGNOSIS — D500 Iron deficiency anemia secondary to blood loss (chronic): Secondary | ICD-10-CM

## 2018-05-13 DIAGNOSIS — R1310 Dysphagia, unspecified: Secondary | ICD-10-CM

## 2018-05-13 DIAGNOSIS — F0280 Dementia in other diseases classified elsewhere without behavioral disturbance: Secondary | ICD-10-CM

## 2018-05-13 LAB — RENAL FUNCTION PANEL
Albumin: 2 g/dL — ABNORMAL LOW (ref 3.5–5.0)
Anion gap: 6 (ref 5–15)
BUN: 18 mg/dL (ref 8–23)
CO2: 21 mmol/L — ABNORMAL LOW (ref 22–32)
Calcium: 7.8 mg/dL — ABNORMAL LOW (ref 8.9–10.3)
Chloride: 115 mmol/L — ABNORMAL HIGH (ref 98–111)
Creatinine, Ser: 1 mg/dL (ref 0.61–1.24)
GFR calc Af Amer: >60 mL/min (ref >60)
GFR calc non Af Amer: >60 mL/min (ref >60)
Glucose, Bld: 104 mg/dL — ABNORMAL HIGH (ref 70–99)
Phosphorus: 3.1 mg/dL (ref 2.5–4.6)
Potassium: 4.1 mmol/L (ref 3.5–5.1)
Sodium: 142 mmol/L (ref 135–145)

## 2018-05-13 LAB — HEMOGLOBIN AND HEMATOCRIT, BLOOD
HCT: 23 % — ABNORMAL LOW (ref 39.0–52.0)
HCT: 24.8 % — ABNORMAL LOW (ref 39.0–52.0)
Hemoglobin: 6.8 g/dL — CL (ref 13.0–17.0)
Hemoglobin: 7.6 g/dL — ABNORMAL LOW (ref 13.0–17.0)

## 2018-05-13 LAB — CBC WITH DIFFERENTIAL/PLATELET
Abs Immature Granulocytes: 0.04 10*3/uL (ref 0.00–0.07)
Abs Immature Granulocytes: 0.03 10*3/uL (ref 0.00–0.07)
Basophils Absolute: 0 10*3/uL (ref 0.0–0.1)
Basophils Absolute: 0 10*3/uL (ref 0.0–0.1)
Basophils Relative: 0 %
Basophils Relative: 0 %
Eosinophils Absolute: 0.3 10*3/uL (ref 0.0–0.5)
Eosinophils Absolute: 0.2 10*3/uL (ref 0.0–0.5)
Eosinophils Relative: 3 %
Eosinophils Relative: 3 %
HCT: 19.5 % — ABNORMAL LOW (ref 39.0–52.0)
HCT: 19.1 % — ABNORMAL LOW (ref 39.0–52.0)
Hemoglobin: 5.5 g/dL — CL (ref 13.0–17.0)
Hemoglobin: 5.5 g/dL — CL (ref 13.0–17.0)
Immature Granulocytes: 1 %
Immature Granulocytes: 0 %
Lymphocytes Relative: 22 %
Lymphocytes Relative: 18 %
Lymphs Abs: 1.7 10*3/uL (ref 0.7–4.0)
Lymphs Abs: 1.5 10*3/uL (ref 0.7–4.0)
MCH: 26.2 pg (ref 26.0–34.0)
MCH: 26.7 pg (ref 26.0–34.0)
MCHC: 28.2 g/dL — ABNORMAL LOW (ref 30.0–36.0)
MCHC: 28.8 g/dL — ABNORMAL LOW (ref 30.0–36.0)
MCV: 92.9 fL (ref 80.0–100.0)
MCV: 92.7 fL (ref 80.0–100.0)
Monocytes Absolute: 0.6 10*3/uL (ref 0.1–1.0)
Monocytes Absolute: 0.7 10*3/uL (ref 0.1–1.0)
Monocytes Relative: 8 %
Monocytes Relative: 9 %
Neutro Abs: 5.1 10*3/uL (ref 1.7–7.7)
Neutro Abs: 5.7 10*3/uL (ref 1.7–7.7)
Neutrophils Relative %: 66 %
Neutrophils Relative %: 70 %
Platelets: 130 10*3/uL — ABNORMAL LOW (ref 150–400)
Platelets: 126 10*3/uL — ABNORMAL LOW (ref 150–400)
RBC: 2.1 MIL/uL — ABNORMAL LOW (ref 4.22–5.81)
RBC: 2.06 MIL/uL — ABNORMAL LOW (ref 4.22–5.81)
RDW: 21 % — ABNORMAL HIGH (ref 11.5–15.5)
RDW: 21 % — ABNORMAL HIGH (ref 11.5–15.5)
WBC: 7.8 10*3/uL (ref 4.0–10.5)
WBC: 8.2 10*3/uL (ref 4.0–10.5)
nRBC: 0 % (ref 0.0–0.2)
nRBC: 0 % (ref 0.0–0.2)

## 2018-05-13 LAB — GLUCOSE, CAPILLARY
Glucose-Capillary: 101 mg/dL — ABNORMAL HIGH (ref 70–99)
Glucose-Capillary: 104 mg/dL — ABNORMAL HIGH (ref 70–99)
Glucose-Capillary: 106 mg/dL — ABNORMAL HIGH (ref 70–99)
Glucose-Capillary: 123 mg/dL — ABNORMAL HIGH (ref 70–99)
Glucose-Capillary: 141 mg/dL — ABNORMAL HIGH (ref 70–99)
Glucose-Capillary: 178 mg/dL — ABNORMAL HIGH (ref 70–99)
Glucose-Capillary: 185 mg/dL — ABNORMAL HIGH (ref 70–99)

## 2018-05-13 LAB — OCCULT BLOOD X 1 CARD TO LAB, STOOL: FECAL OCCULT BLD: POSITIVE — AB

## 2018-05-13 LAB — PREPARE RBC (CROSSMATCH)

## 2018-05-13 LAB — MAGNESIUM: Magnesium: 2.1 mg/dL (ref 1.7–2.4)

## 2018-05-13 LAB — ABO/RH: ABO/RH(D): A NEG

## 2018-05-13 MED ORDER — FUROSEMIDE 10 MG/ML IJ SOLN
20.0000 mg | Freq: Once | INTRAMUSCULAR | Status: AC
  Administered 2018-05-13: 20 mg via INTRAVENOUS
  Filled 2018-05-13: qty 2

## 2018-05-13 MED ORDER — SODIUM CHLORIDE 0.9 % IV SOLN
8.0000 mg/h | INTRAVENOUS | Status: AC
  Administered 2018-05-13 – 2018-05-16 (×7): 8 mg/h via INTRAVENOUS
  Filled 2018-05-13 (×12): qty 80

## 2018-05-13 MED ORDER — PANTOPRAZOLE SODIUM 40 MG IV SOLR
40.0000 mg | Freq: Two times a day (BID) | INTRAVENOUS | Status: DC
  Administered 2018-05-16 – 2018-05-21 (×10): 40 mg via INTRAVENOUS
  Filled 2018-05-13 (×10): qty 40

## 2018-05-13 MED ORDER — SODIUM CHLORIDE 0.9% IV SOLUTION: Freq: Once | INTRAVENOUS | Status: DC

## 2018-05-13 MED ORDER — POLYVINYL ALCOHOL 1.4 % OP SOLN
1.0000 [drp] | OPHTHALMIC | Status: DC | PRN
  Administered 2018-05-13 – 2018-05-20 (×5): 1 [drp] via OPHTHALMIC
  Filled 2018-05-13: qty 15

## 2018-05-13 MED ORDER — SODIUM CHLORIDE 0.9 % IV SOLN
80.0000 mg | Freq: Once | INTRAVENOUS | Status: AC
  Administered 2018-05-13: 80 mg via INTRAVENOUS
  Filled 2018-05-13: qty 80

## 2018-05-13 NOTE — Consult Note (Signed)
Referring Provider: [facility] Primary Care Physician:  [name], MD Primary Gastroenterologist:  Scarlette Shorts, MD     Reason for Consultation:   Anemia, heme positive stool    ASSESSMENT / PLAN:    18. 85 year old male with heme positive normocytic anemia.  Hemoglobin has been declining since early January when it was 10.  Admitted 2 days ago with hemoglobin of 7.6, down to 5.5 today in the setting of recent Lovenox.  Per nursing staff stools dark but not black on oral iron. -Blood transfusion in progress -Protonix gtt already ordered.  -It appears from 2 recent previous admissions that patient may have been evaluated by palliative medicine and/or Hospice. He is full code.  I will need to talk with the family to see how they feel about work-up which would likely include upper endoscopy   2.  Chronic systolic diastolic heart failure / DM 2 / dementia  HPI:      HPI: Jose [name] is a 85 y.o. male with DM 2, chronic systolic and diastolic heart failure, peripheral vascular disease and dementia . He is known remotely to Dr. Henrene Pastor.  Patient had a colonoscopy by Dr. Henrene Pastor back in 2008.  He has a history of colon polyps, diverticulosis and hemorrhoids.  Mr. Ahrendt has had 2 recent admissions for acute respiratory failure presumably secondary to aspiration pneumonia  vrs RSV PNA.  During his most recent admission 05/01/2018 to 05/05/2018 patient was evaluated by Speech Therapy, eventually discharged to skilled nursing facility on a dysphagia 1 diet .  Patient readmitted 2 days ago with shortness of breath, leukocytosis, and productive cough.  He has been treated for presumed aspiration pneumonia.  He has been reevaluated by SLP this admission.  A MBSS has been done. He could be intermittently aspirating especially when taking p.o. during times of fatigue. Patient felt to be safe for diet advancement to dysphagia 3 diet with thin liquids  We have been asked see the patient for anemia.   Hemoglobin early January of this year was 10, down to 8.2 in late January.  Presented to the hospital this time with hemoglobin down to 7.6, it has declined further to 5.5 and this has been rechecked.  Those are heme positive.  RN describes dark but not black stools.  Patient is on oral iron.  No family in the room to help with history.  There is a sitter at the bedside.  Patient got 2 doses of Lovenox this admission.    Past Medical History:  Diagnosis Date  . Abnormality of gait   . Backache, unspecified   . CHF (congestive heart failure) ([place])   . Colon polyps    adenomatous  . Dementia ([place]) 11/12/2012  . Depression   . Diabetes mellitus without complication ([place])   . Diverticulosis   . Essential and other specified forms of tremor   . Hemorrhoids   . History of bilateral hip replacements 11/12/2012  . Memory loss   . 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 Pain in joint, pelvic region and thigh   . Skin cancer of scalp   . Spinal stenosis, lumbar region, without neurogenic claudication   . Unspecified hereditary and idiopathic peripheral neuropathy     Past Surgical History:  Procedure Laterality Date  . CATARACT EXTRACTION, BILATERAL  2012  . JOINT REPLACEMENT    . RETINAL LASER PROCEDURE  2012   retinal wrinkle  . SKIN CANCER EXCISION    .  TOTAL HIP ARTHROPLASTY Left 2000  . TOTAL HIP ARTHROPLASTY (aka REPLACEMENT) Right 2011    Prior to Admission medications   Medication Sig Start Date End Date Taking? Authorizing Provider  buPROPion (WELLBUTRIN SR) 100 MG 12 hr tablet Take 100 mg by mouth daily. 11/23/16  Yes [provider]  carvedilol (COREG) 3.125 MG tablet Take 1 tablet (3.125 mg total) by mouth 2 (two) times daily with a meal. 11/30/16  Yes Theodis Blaze, MD  Cholecalciferol (VITAMIN D) 1000 UNITS capsule Take 1,000 Units by mouth daily.     Yes [provider]  docusate sodium (COLACE) 100 MG capsule Take 100  mg by mouth daily.   Yes [provider]  donepezil (ARICEPT) 23 MG TABS tablet Take 1 tablet (23 mg total) by mouth daily. 10/14/17  Yes Ward Givens, NP  dorzolamide-timolol (COSOPT) 22.3-6.8 MG/ML ophthalmic solution Place 1 drop into the left eye 2 (two) times daily.  09/20/12  Yes [provider]  ferrous sulfate 325 (65 FE) MG tablet Take 325 mg by mouth daily with breakfast.   Yes [provider]  Flaxseed, Linseed, (FLAXSEED OIL) 1000 MG CAPS Take 1,000 mg by mouth daily.     Yes [provider]  folic acid (FOLVITE) 867 MCG tablet Take 800 mcg by mouth every evening.    Yes [provider]  furosemide (LASIX) 20 MG tablet Take 1 tablet (20 mg total) by mouth daily. 05/05/18  Yes Roney Jaffe, MD  insulin glargine (LANTUS) 100 UNIT/ML injection Inject 10 Units into the skin at bedtime.    Yes [provider]  lisinopril (PRINIVIL,ZESTRIL) 5 MG tablet Take 1 tablet (5 mg total) by mouth daily. 12/01/16  Yes Theodis Blaze, MD  memantine (NAMENDA XR) 28 MG CP24 24 hr capsule Take 1 capsule (28 mg total) by mouth daily. 10/14/17  Yes Ward Givens, NP  metFORMIN (GLUCOPHAGE) 500 MG tablet Take 500 mg by mouth 2 (two) times daily with a meal.   Yes [provider]  Multiple Vitamin (MULTIVITAMIN) tablet Take 1 tablet by mouth daily.     Yes [provider]  Probiotic Product (ALIGN) 4 MG CAPS Take 4 mg by mouth daily.   Yes [provider]  simvastatin (ZOCOR) 5 MG tablet Take 5 mg by mouth daily.   Yes [provider]  sitaGLIPtin (JANUVIA) 25 MG tablet Take 1 tablet (25 mg total) by mouth daily. 01/08/18  Yes [name], Dinah C, NP    Current Facility-Administered Medications  Medication Dose Route Frequency Provider Last Rate Last Dose  . 0.9 %  sodium chloride infusion (Manually program via Guardrails IV Fluids)   Intravenous Once Dana Allan I, MD      . 0.9 %  sodium chloride infusion    Intravenous Continuous Dana Allan I, MD 50 mL/hr at 05/13/18 0439    . Ampicillin-Sulbactam (UNASYN) 3 g in sodium chloride 0.9 % 100 mL IVPB  3 g Intravenous Q6H Bertis Ruddy, RPH 133.3 mL/hr at 05/13/18 0445 3 g at 05/13/18 0445  . buPROPion [facility] SR) 12 hr tablet 100 mg  100 mg Oral Daily Jennette Kettle M, DO   100 mg at 05/13/18 0820  . docusate sodium (COLACE) capsule 100 mg  100 mg Oral Daily Jennette Kettle M, DO   100 mg at 05/13/18 0820  . donepezil (ARICEPT) tablet 23 mg  23 mg Oral Daily Jennette Kettle M, DO   23 mg at 05/13/18 0820  . dorzolamide-timolol (  COSOPT) 22.3-6.8 MG/ML ophthalmic solution 1 drop  1 drop Left Eye BID Jennette Kettle M, DO   1 drop at 05/13/18 8466  . ferrous sulfate tablet 325 mg  325 mg Oral Q breakfast Etta Quill, DO   325 mg at 05/13/18 0820  . furosemide (LASIX) injection 20 mg  20 mg Intravenous Once Dana Allan I, MD      . insulin aspart (novoLOG) injection 0-9 Units  0-9 Units Subcutaneous Q4H Etta Quill, DO   1 Units at 05/12/18 2100  . memantine (NAMENDA XR) 24 hr capsule 28 mg  28 mg Oral Daily Jennette Kettle M, DO   28 mg at 05/13/18 0820  . naphazoline-glycerin (CLEAR EYES REDNESS) ophth solution 1-2 drop  1-2 drop Right Eye QID PRN Etta Quill, DO      . pantoprazole (PROTONIX) 80 mg in sodium chloride 0.9 % 250 mL (0.32 mg/mL) infusion  8 mg/hr Intravenous Continuous Dana Allan I, MD      . Derrill Memo ON 05/16/2018] pantoprazole (PROTONIX) injection 40 mg  40 mg Intravenous Q12H Bonnell Public, MD      . RESOURCE THICKENUP CLEAR   Oral PRN Dana Allan I, MD      . simvastatin (ZOCOR) tablet 5 mg  5 mg Oral q1800 Etta Quill, DO   5 mg at 05/12/18 1704    Allergies as of 05/11/2018 - Review Complete 05/11/2018  Allergen Reaction Noted  . Axona [bacid] Other (See Comments) 06/22/2016  . Gabapentin Other (See Comments) 06/22/2016    Family History  Problem Relation Age of Onset  . Heart  failure Mother     Social History   Socioeconomic History  . Marital status: Married    Spouse name: [name]  . Number of children: 2  . Years of education: Chief Executive Officer  . Highest education level: Not on file  Occupational History  . 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 Financial resource strain: Not hard at all  . Food insecurity:    Worry: Never true    Inability: Never true  . Transportation needs:    Medical: No    Non-medical: No  Tobacco Use  . Smoking status: Former Smoker    Types: Cigarettes  . Smokeless tobacco: Never Used  Substance and Sexual Activity  . Alcohol use: Yes    Alcohol/week: 14.0 standard drinks    Types: 14 Glasses of wine per week    Comment: occas, one glass of wine before dinner,sometimes 1/2 glass  . Drug use: No  . Sexual activity: Never  Lifestyle  . Physical activity:    Days per week: 0 days    Minutes per session: 0 min  . Stress: Only a little  Relationships  . Social connections:    Talks on phone: Never    Gets together: More than three times a week    Attends religious service: Never    Active member of club or organization: No    Attends meetings of clubs or organizations: Never    Relationship status: Married  . Intimate partner violence:    Fear of current or ex partner: No    Emotionally abused: No    Physically abused: No    Forced sexual activity: No  Other Topics Concern  . Not on file  Social History Narrative   Patient is right handed and resides in home with wife  Review of Systems: All systems reviewed and negative except where noted in HPI.  Physical Exam: Vital signs in last 24 hours: Temp:  [98 F (36.7 C)-98.6 F (37 C)] 98.4 F (36.9 C) (02/11 1027) Pulse Rate:  [81-128] 87 (02/11 1027) Resp:  [14-24] 24 (02/11 1027) BP: (90-149)/(47-74) 113/65 (02/11 1027) SpO2:  [95 %-100 %] 100 % (02/11 1027) Weight:  [77 kg] 77 kg (02/10 2100) Last BM Date:  05/13/18 General:   Alert, well-developed,  White male in NAD Psych:  Pleasant, cooperative. . Eyes:  Pupils equal, sclera clear, no icterus.   Conjunctiva pale. Ears:  Normal auditory acuity. Nose:  No deformity, discharge,  or lesions. Neck:  Supple; no masses Lungs:  Clear throughout to auscultation.   No wheezes, crackles, or rhonchi.  Heart:  Regular rate and rhythm; no murmurs, no lower extremity edema Abdomen:  Soft, non-distended, nontender, BS active, no palp mass    Rectal:  Deferred  Msk:  Symmetrical without gross deformities. . Neurologic:  Alert, follows commands, answers questions appropriately but then does say some unrelated things. Skin:  Intact without significant lesions or rashes.   Intake/Output from previous day: 02/10 0701 - 02/11 0700 In: 340 [P.O.:240; IV Piggyback:100] Out: 300 [Urine:300] Intake/Output this shift: Total I/O In: 340 [P.O.:240; IV Piggyback:100] Out: -   Lab Results: Recent Labs    05/11/18 1626 05/13/18 0251 05/13/18 0505  WBC 15.6* 7.8 8.2  HGB 7.6* 5.5* 5.5*  HCT 26.0* 19.5* 19.1*  PLT 182 130* 126*   BMET Recent Labs    05/11/18 1626 05/13/18 0251  NA 136 142  K 4.8 4.1  CL 104 115*  CO2 21* 21*  GLUCOSE 256* 104*  BUN 25* 18  CREATININE 1.15 1.00  CALCIUM 8.3* 7.8*   LFT Recent Labs    05/11/18 1626 05/13/18 0251  PROT 5.4*  --   ALBUMIN 2.5* 2.0*  AST 37  --   ALT 27  --   ALKPHOS 86  --   BILITOT 0.5  --     Studies/Results: Dg Chest 2 View  Result Date: 05/11/2018 CLINICAL DATA:  Possible pneumonia EXAM: CHEST - 2 VIEW COMPARISON:  Chest radiograph 05/01/2018 FINDINGS: Monitoring leads overlie the patient. Stable cardiac and mediastinal contours. Low lung volumes. Bibasilar heterogeneous pulmonary opacities. No pleural effusion or pneumothorax. Thoracic spine degenerative changes. Elevation right hemidiaphragm. IMPRESSION: Right-greater-than-left basilar opacities favored to represent atelectasis.  Electronically Signed   By: Lovey Newcomer M.D.   On: 05/11/2018 17:15   Dg Chest Port 1 View  Result Date: 05/13/2018 CLINICAL DATA:  Pneumonia. EXAM: PORTABLE CHEST 1 VIEW COMPARISON:  Radiograph May 11, 2018. FINDINGS: Stable cardiomegaly. Atherosclerosis of thoracic aorta is noted. Stable elevated right hemidiaphragm is noted. Mild bibasilar subsegmental atelectasis is noted. No pneumothorax or pleural effusion is noted. Bony thorax is unremarkable. IMPRESSION: Mild bibasilar subsegmental atelectasis. Aortic Atherosclerosis (ICD10-I70.0). Electronically Signed   By: Marijo Conception, M.D.   On: 05/13/2018 08:51     Tye Savoy, NP-C @  05/13/2018, 11:29 AM

## 2018-05-13 NOTE — Progress Notes (Signed)
Patient ID: Jose [name], male   DOB: [birth year], 85 y.o.   MRN: [id number]  This NP visited patient at the bedside as a follow up to  yesterday's [place].  Since wife and daughter at bedside.  Patient remains pleasantly confused and is lethargic.  Family encourages patient for p.o. intake.    Family update the patient's current medical situation specific to his low hemoglobin and need for blood.  We had a continued conversation regarding his overall frailty and high risk for decompensation.     Discussed with family the importance of continued conversation with each other and themedical providers regarding overall plan of care and treatment options,  ensuring decisions are within the context of the patients values and GOCs.  We again discussed the limitations of medical interventions to provide ongoing quality of life when the body begins to fail to thrive.  Questions and concerns addressed   Discussed with Dr Marthenia Rolling  Total time spent on the unit was 35 minutes  Greater than 50% of the time was spent in counseling and coordination of care  Wadie Lessen NP  Palliative Medicine Team Team Phone # [phone] Pager [phone]

## 2018-05-13 NOTE — Progress Notes (Addendum)
This nurse notified by lab of a critical hemoglobin of 5.5. Patient assessed and a new set of vitals complete. Patient still alert to self only and pleasantly confused, able to follow commands.  No obvious signs of bleeding. Temp: 98.6, BP: 101/68(81), Pulse: 88, Resp: 14. Spo2: 100 3L [place]. Occult stool card sent to lab. Lab at bedside 0510 to redraw CBC. Chest xray on the way to bedside now. Will page results to Dr. Marthenia Rolling when available.   Bill RN  [phone]: patient with positive fecal occult. Stool sample not obviously bloody or black and tarry; sample was dark brown and formed.

## 2018-05-13 NOTE — Anesthesia Preprocedure Evaluation (Deleted)
Anesthesia Evaluation  Patient identified by MRN, date of birth, ID band Patient awake    Reviewed: Allergy & Precautions, NPO status , Patient's Chart, lab work & pertinent test results  Airway        Dental  (+) Dental Advisory Given   Pulmonary pneumonia, former smoker,           Cardiovascular hypertension, Pt. on home beta blockers and Pt. on medications +CHF       Neuro/Psych PSYCHIATRIC DISORDERS Anxiety Depression Dementia    GI/Hepatic negative GI ROS, Neg liver ROS,   Endo/Other  negative endocrine ROSdiabetes  Renal/GU Renal disease     Musculoskeletal negative musculoskeletal ROS (+)   Abdominal   Peds  Hematology negative hematology ROS (+) anemia ,   Anesthesia Other Findings   Reproductive/Obstetrics negative OB ROS                             Anesthesia Physical Anesthesia Plan  ASA: III  Anesthesia Plan: MAC   Post-op Pain Management:    Induction: Intravenous  PONV Risk Score and Plan: 1 and Propofol infusion, TIVA and Ondansetron  Airway Management Planned: Natural Airway  Additional Equipment:   Intra-op Plan:   Post-operative Plan:   Informed Consent: I have reviewed the patients History and Physical, chart, labs and discussed the procedure including the risks, benefits and alternatives for the proposed anesthesia with the patient or authorized representative who has indicated his/her understanding and acceptance.     Dental advisory given  Plan Discussed with: CRNA  Anesthesia Plan Comments:         Anesthesia Quick Evaluation

## 2018-05-13 NOTE — Progress Notes (Signed)
PROGRESS NOTE    SATYA BUTTRAM  TIW:[id number] DOB: 1930-12-22 DOA: 05/11/2018 PCP: [name], MD  Outpatient Specialists:   Brief Narrative:  Jose [name] is an 85 year old male, with past medical history significant for advanced dementia, DM2, CHF, HTN.  Patient was admitted for PNA on 04/26/2018, recorded as aspiration Pneumonia, though, RVP came back positive for RSV.  Patient was discharged on 04/30/2018 and readmitted one day later on 05/01/2018 with recurrent acute resp failure with hypoxia suspected to be due to recurrent aspiration.  Patient was discharged again on 05/05/2018 on Dysphagia 1 diet, thin liquids.  Patient was advanced to dysphagia 3 diet with nectar thick liquids at SNF.  Patient finished antibiotics for pneumonia a day prior to presentation, and was doing well.  Detailed history leading to current admission remains vague.  As per prior documentation and, probably, collateral information, on the day of presentation, patient ate breakfast when wife wasn't there at SNF and afterwards, patient was noted to be more ill with associated shortness of breath.  Patient was readmitted with possible recurrent pneumonia.  Patient remains a full code.  Patient may be failing to thrive outside of the hospital environment.  Palliative care team has been reconsulted.  05/13/2018: Hemoglobin done earlier today, and repeated was noted to to be 5.5 g/dL.  On further questioning, history of dark stools was reported.  Patient is also on iron supplement.  Patient will be transfused with 2 units of packed red blood cells.  Patient be started on IV Protonix drip.  GI team has been consulted.  Will defer extent of work-up to the GI team and the palliative care team.  Palliative care team is discussing goal of care and end-of-life issues with the patient's family.  As documented above, patient has advanced dementia.  Assessment & Plan:   Principal Problem:   Aspiration pneumonia ([place]) Active Problems:  Alzheimer disease ([place])   Type 2 diabetes mellitus with diabetic chronic kidney disease ([place])   Chronic combined systolic and diastolic congestive heart failure (H[facility])   Oropharyngeal dysphagia   HTN (hypertension)   DNR (do not resuscitate) discussion   Eye irritation  Possible pneumonia:  -Suspicion for ongoing aspiration, 3rd admit for PNA in the past couple of weeks. -Although with that said, RSV (diagnosed on RVP 04/26/2018) is not what I would consider a typical aspiration organism. -See various notes from SLP evals thus far -PNA pathway -Unasyn -IVF: NS at 100 -Cultures pending -NPO pending SLP re-eval in AM -05/12/2018: Continue antibiotics for now.  Follow speech evaluation.  Check procalcitonin.  Palliative care team..  Further management will depend on hospital course.  Repeat lab work in the morning. -05/13/2018: Repeat chest x-ray is reporting atelectasis.  Procalcitonin is on the downward trend.  We have a low threshold to proceed with CT scan chest without contrast to really confirm if there is a pneumonic process.  Patient's main pathology may be symptomatic anemia.  Symptomatic anemia/likely blood loss anemia: Transfuse packed red blood cells as planned. IV Protonix drip. Continue iron supplements. GI input is appreciated. Further work-up will depend on the goal of care. Monitor H&H every 8 hours for now.  Advanced dementia - Palliative care team input is appreciated.    Hypertension:  Home medications on hold.   Stable.  Chronic congestive heart failure: Lasix on hold. Patient is currently on IV fluid. Continue IV fluid at the rate of 50 cc an hour.    Diabetes mellitus type 2: -  Holding home meds -Patient is currently n.p.o. for now. -Sliding scale insulin coverage.  Dysphagia: -Speech therapy input is highly appreciated.  DVT prophylaxis: Lovenox Code Status: Full Family Communication:  Disposition Plan: SNF after admit  Consultants:   Palliative  care  GI  Procedures:   None  Antimicrobials:   IV Unasyn   Subjective: Patient seen alongside patient's care giver. No history from patient.  Objective: Vitals:   05/12/18 2100 05/12/18 2339 05/13/18 0432 05/13/18 0829  BP:  110/65 101/68 (!) 149/74  Pulse:  92 88 94  Resp:  14 14 18   Temp:  98 F (36.7 C) 98.6 F (37 C) 98.6 F (37 C)  TempSrc:  Oral Oral Oral  SpO2:  100% 100% 95%  Weight: 77 kg     Height: 6\' 1"  (1.854 m)       Intake/Output Summary (Last 24 hours) at 05/13/2018 0910 Last data filed at 05/13/2018 0745 Gross per 24 hour  Intake 680 ml  Output 300 ml  Net 380 ml   Filed Weights   05/12/18 2100  Weight: 77 kg    Examination:  General exam: Appears calm and comfortable  Respiratory system: Clear to auscultation. Respiratory effort normal. Cardiovascular system: S1 & S2 heard. Gastrointestinal system: Abdomen is nondistended, soft and nontender. No organomegaly or masses felt. Normal bowel sounds heard. Central nervous system: Alert and oriented. No focal neurological deficits. Extremities: No leg edema.  Data Reviewed: I have personally reviewed following labs and imaging studies  CBC: Recent Labs  Lab 05/11/18 1626 05/13/18 0251 05/13/18 0505  WBC 15.6* 7.8 8.2  NEUTROABS 13.2* 5.1 5.7  HGB 7.6* 5.5* 5.5*  HCT 26.0* 19.5* 19.1*  MCV 91.2 92.9 92.7  PLT 182 130* 073*   Basic Metabolic Panel: Recent Labs  Lab 05/11/18 1626 05/13/18 0251  NA 136 142  K 4.8 4.1  CL 104 115*  CO2 21* 21*  GLUCOSE 256* 104*  BUN 25* 18  CREATININE 1.15 1.00  CALCIUM 8.3* 7.8*  MG  --  2.1  PHOS  --  3.1   GFR: Estimated Creatinine Clearance: 58.8 mL/min (by C-G formula based on SCr of 1 mg/dL). Liver Function Tests: Recent Labs  Lab 05/11/18 1626 05/13/18 0251  AST 37  --   ALT 27  --   ALKPHOS 86  --   BILITOT 0.5  --   PROT 5.4*  --   ALBUMIN 2.5* 2.0*   No results for input(s): LIPASE, AMYLASE in the last 168 hours. No  results for input(s): AMMONIA in the last 168 hours. Coagulation Profile: No results for input(s): INR, PROTIME in the last 168 hours. Cardiac Enzymes: Recent Labs  Lab 05/11/18 1626  TROPONINI <0.03   BNP (last 3 results) No results for input(s): PROBNP in the last 8760 hours. HbA1C: No results for input(s): HGBA1C in the last 72 hours. CBG: Recent Labs  Lab 05/12/18 1717 05/12/18 2000 05/13/18 0002 05/13/18 0442 05/13/18 0810  GLUCAP 154* 146* 101* 106* 123*   Lipid Profile: No results for input(s): CHOL, HDL, LDLCALC, TRIG, CHOLHDL, LDLDIRECT in the last 72 hours. Thyroid Function Tests: No results for input(s): TSH, T4TOTAL, FREET4, T3FREE, THYROIDAB in the last 72 hours. Anemia Panel: No results for input(s): VITAMINB12, FOLATE, FERRITIN, TIBC, IRON, RETICCTPCT in the last 72 hours. Urine analysis:    Component Value Date/Time   COLORURINE YELLOW 05/01/2018 1109   APPEARANCEUR CLEAR 05/01/2018 1109   LABSPEC 1.011 05/01/2018 1109   PHURINE 6.0 05/01/2018  [place] 05/01/2018 1109   HGBUR LARGE (A) 05/01/2018 [place] 05/01/2018 1109   [place] 05/01/2018 1109   PROTEINUR NEGATIVE 05/01/2018 1109   UROBILINOGEN 0.2 06/24/2014 2112   NITRITE NEGATIVE 05/01/2018 1109   [place] 05/01/2018 1109   Sepsis Labs: @LABRCNTIP (procalcitonin:4,lacticidven:4)  ) Recent Results (from the past 240 hour(s))  Culture, blood (routine x 2) Call MD if unable to obtain prior to antibiotics being given     Status: None (Preliminary result)   Collection Time: 05/11/18  8:30 PM  Result Value Ref Range Status   Specimen Description BLOOD LEFT ARM  Final   Special Requests   Final    BOTTLES DRAWN AEROBIC AND ANAEROBIC Blood Culture results may not be optimal due to an excessive volume of blood received in culture bottles   Culture   Final    NO GROWTH 1 DAY Performed at [facility], [place] [address] [place],  [place] [address]    Report Status PENDING  Incomplete  Culture, blood (routine x 2) Call MD if unable to obtain prior to antibiotics being given     Status: None (Preliminary result)   Collection Time: 05/11/18  8:50 PM  Result Value Ref Range Status   Specimen Description BLOOD LEFT HAND  Final   Special Requests   Final    BOTTLES DRAWN AEROBIC ONLY Blood Culture results may not be optimal due to an excessive volume of blood received in culture bottles   Culture   Final    NO GROWTH 1 DAY Performed at [facility], [place] [address] [place], [place] [address]    Report Status PENDING  Incomplete         Radiology Studies: Dg Chest 2 View  Result Date: 05/11/2018 CLINICAL DATA:  Possible pneumonia EXAM: CHEST - 2 VIEW COMPARISON:  Chest radiograph 05/01/2018 FINDINGS: Monitoring leads overlie the patient. Stable cardiac and mediastinal contours. Low lung volumes. Bibasilar heterogeneous pulmonary opacities. No pleural effusion or pneumothorax. Thoracic spine degenerative changes. Elevation right hemidiaphragm. IMPRESSION: Right-greater-than-left basilar opacities favored to represent atelectasis. Electronically Signed   By: Lovey Newcomer M.D.   On: 05/11/2018 17:15   Dg Chest Port 1 View  Result Date: 05/13/2018 CLINICAL DATA:  Pneumonia. EXAM: PORTABLE CHEST 1 VIEW COMPARISON:  Radiograph May 11, 2018. FINDINGS: Stable cardiomegaly. Atherosclerosis of thoracic aorta is noted. Stable elevated right hemidiaphragm is noted. Mild bibasilar subsegmental atelectasis is noted. No pneumothorax or pleural effusion is noted. Bony thorax is unremarkable. IMPRESSION: Mild bibasilar subsegmental atelectasis. Aortic Atherosclerosis (ICD10-I70.0). Electronically Signed   By: Marijo Conception, M.D.   On: 05/13/2018 08:51        Scheduled Meds: . sodium chloride   Intravenous Once  . buPROPion  100 mg Oral Daily  . docusate sodium  100 mg Oral Daily  . donepezil  23 mg Oral Daily  .  dorzolamide-timolol  1 drop Left Eye BID  . ferrous sulfate  325 mg Oral Q breakfast  . furosemide  20 mg Intravenous Once  . insulin aspart  0-9 Units Subcutaneous Q4H  . memantine  28 mg Oral Daily  . [START ON 05/16/2018] pantoprazole  40 mg Intravenous Q12H  . simvastatin  5 mg Oral q1800   Continuous Infusions: . sodium chloride 50 mL/hr at 05/13/18 0439  . ampicillin-sulbactam (UNASYN) IV 3 g (05/13/18 0445)  . pantoprazole (PROTONIX) IVPB    . pantoprozole (PROTONIX) infusion  LOS: 2 days    Time spent: 35 minutes    Dana Allan, MD  [facility] Pager #: [phone] 7PM-7AM contact night coverage as above

## 2018-05-14 ENCOUNTER — Inpatient Hospital Stay (HOSPITAL_COMMUNITY): Payer: Medicare Other

## 2018-05-14 ENCOUNTER — Encounter (HOSPITAL_COMMUNITY)
Admission: EM | Disposition: A | Discharge status: Skilled Nursing Facility | Payer: Self-pay | Source: Skilled Nursing Facility | Attending: Internal Medicine

## 2018-05-14 DIAGNOSIS — J9600 Acute respiratory failure, unspecified whether with hypoxia or hypercapnia: Secondary | ICD-10-CM

## 2018-05-14 LAB — BLOOD GAS, ARTERIAL
Acid-base deficit: 4.1 mmol/L — ABNORMAL HIGH (ref 0.0–2.0)
Acid-base deficit: 2.2 mmol/L — ABNORMAL HIGH (ref 0.0–2.0)
Bicarbonate: 22.2 mmol/L (ref 20.0–28.0)
Bicarbonate: 22.2 mmol/L (ref 20.0–28.0)
Delivery systems: POSITIVE
Drawn by: 350431
Drawn by: 350431
Expiratory PAP: 6
FIO2: 100
FIO2: 50
Inspiratory PAP: 12
O2 Saturation: 99.2 %
O2 Saturation: 97.9 %
PO2 ART: 168 mmHg — AB (ref 83.0–108.0)
Patient temperature: 98
Patient temperature: 97.7
RATE: 12 resp/min
pCO2 arterial: 52.7 mmHg — ABNORMAL HIGH (ref 32.0–48.0)
pCO2 arterial: 37.9 mmHg (ref 32.0–48.0)
pH, Arterial: 7.245 — ABNORMAL LOW (ref 7.350–7.450)
pH, Arterial: 7.383 (ref 7.350–7.450)
pO2, Arterial: 94.3 mmHg (ref 83.0–108.0)

## 2018-05-14 LAB — BPAM RBC
Blood Product Expiration Date: 202002202359
Blood Product Expiration Date: 202002202359
ISSUE DATE / TIME: 202002111001
ISSUE DATE / TIME: 202002111427
Unit Type and Rh: 600
Unit Type and Rh: 600

## 2018-05-14 LAB — TYPE AND SCREEN
ABO/RH(D): A NEG
Antibody Screen: NEGATIVE
Unit division: 0
Unit division: 0

## 2018-05-14 LAB — BASIC METABOLIC PANEL
Anion gap: 9 (ref 5–15)
BUN: 17 mg/dL (ref 8–23)
CO2: 23 mmol/L (ref 22–32)
Calcium: 8.1 mg/dL — ABNORMAL LOW (ref 8.9–10.3)
Chloride: 111 mmol/L (ref 98–111)
Creatinine, Ser: 1.24 mg/dL (ref 0.61–1.24)
GFR, EST NON AFRICAN AMERICAN: 53 mL/min — AB (ref >60)
Glucose, Bld: 189 mg/dL — ABNORMAL HIGH (ref 70–99)
Potassium: 3.6 mmol/L (ref 3.5–5.1)
Sodium: 143 mmol/L (ref 135–145)

## 2018-05-14 LAB — CBC
HCT: 27.2 % — ABNORMAL LOW (ref 39.0–52.0)
Hemoglobin: 8.3 g/dL — ABNORMAL LOW (ref 13.0–17.0)
MCH: 27.3 pg (ref 26.0–34.0)
MCHC: 30.5 g/dL (ref 30.0–36.0)
MCV: 89.5 fL (ref 80.0–100.0)
PLATELETS: 142 10*3/uL — AB (ref 150–400)
RBC: 3.04 MIL/uL — ABNORMAL LOW (ref 4.22–5.81)
RDW: 20.2 % — ABNORMAL HIGH (ref 11.5–15.5)
WBC: 8.1 10*3/uL (ref 4.0–10.5)
nRBC: 0 % (ref 0.0–0.2)

## 2018-05-14 LAB — GLUCOSE, CAPILLARY
Glucose-Capillary: 124 mg/dL — ABNORMAL HIGH (ref 70–99)
Glucose-Capillary: 180 mg/dL — ABNORMAL HIGH (ref 70–99)
Glucose-Capillary: 187 mg/dL — ABNORMAL HIGH (ref 70–99)
Glucose-Capillary: 209 mg/dL — ABNORMAL HIGH (ref 70–99)
Glucose-Capillary: 283 mg/dL — ABNORMAL HIGH (ref 70–99)
Glucose-Capillary: 94 mg/dL (ref 70–99)

## 2018-05-14 LAB — HEMOGLOBIN AND HEMATOCRIT, BLOOD
HCT: 29.4 % — ABNORMAL LOW (ref 39.0–52.0)
HCT: 27.7 % — ABNORMAL LOW (ref 39.0–52.0)
Hemoglobin: 9 g/dL — ABNORMAL LOW (ref 13.0–17.0)
Hemoglobin: 8.5 g/dL — ABNORMAL LOW (ref 13.0–17.0)

## 2018-05-14 SURGERY — CANCELLED PROCEDURE
Anesthesia: Monitor Anesthesia Care

## 2018-05-14 SURGERY — ESOPHAGOGASTRODUODENOSCOPY (EGD) WITH PROPOFOL
Anesthesia: Monitor Anesthesia Care

## 2018-05-14 MED ORDER — FUROSEMIDE 10 MG/ML IJ SOLN
40.0000 mg | Freq: Once | INTRAMUSCULAR | Status: AC
  Administered 2018-05-14: 40 mg via INTRAVENOUS

## 2018-05-14 MED ORDER — POTASSIUM CHLORIDE 10 MEQ/100ML IV SOLN
10.0000 meq | INTRAVENOUS | Status: AC
  Administered 2018-05-14 (×2): 10 meq via INTRAVENOUS
  Filled 2018-05-14 (×2): qty 100

## 2018-05-14 MED ORDER — FUROSEMIDE 10 MG/ML IJ SOLN
INTRAMUSCULAR | Status: AC
  Administered 2018-05-14: 40 mg via INTRAVENOUS
  Filled 2018-05-14: qty 4

## 2018-05-14 MED ORDER — FUROSEMIDE 20 MG PO TABS
20.0000 mg | ORAL_TABLET | Freq: Every day | ORAL | Status: DC
  Administered 2018-05-14 – 2018-05-15 (×2): 20 mg via ORAL
  Filled 2018-05-14 (×2): qty 1

## 2018-05-14 SURGICAL SUPPLY — 14 items

## 2018-05-14 NOTE — Progress Notes (Signed)
RT removed patient from BIPAP and placed on 4lnc. Vital signs stable at this time. RT will continue to monitor.

## 2018-05-14 NOTE — Progress Notes (Signed)
SLP Cancellation Note  Patient Details Name: Jose [name] MRN: [id number] DOB: 11-19-1930   Cancelled treatment:       Reason Eval/Treat Not Completed: Medical issues which prohibited therapy - note that pt had a decline in respiratory status overnight, now requiring BiPAP and therefore NPO. Will continue to follow.   Venita Sheffield Gigi Onstad 05/14/2018, 8:25 AM  Nuala Alpha, M.A. [place] Acute Environmental education officer [phone] Office [phone]

## 2018-05-14 NOTE — Progress Notes (Signed)
Patient ID: CORY KITT, male   DOB: 04/27/1930, 85 y.o.   MRN: [id number]  This NP visited patient at the bedside as a follow up to  yesterday's [place].  Family at bedside.  Patient decompensated last night 2/2 to respiratory distress requiring BiPap.  Worsening anemia over the past few months is ongoing problem and family face treatment option decsions, advanced directive decision and anticipatory care needs.  Overall failure to thrive.  We had a continued conversation regarding his overall frailty and high risk for decompensation.  Encouraged family to consider DNR/DNI status knowing poor outcomes in similar patients.   We again discussed the limitations of medical interventions to provide ongoing quality of life when the body begins to fail to thrive.  Discussed with family the importance of continued conversation with each other and the medical providers regarding overall plan of care and treatment options,  ensuring decisions are within the context of the patients values and GOCs.  Questions and concerns addressed   Discussed with Dr Bonner Puna  Total time spent on the unit was 35 minutes  Greater than 50% of the time was spent in counseling and coordination of care  Wadie Lessen NP  Palliative Medicine Team Team Phone # [phone] Pager [phone]

## 2018-05-14 NOTE — Progress Notes (Signed)
RT Note:  Called to patient's room by Rush Landmark, Therapist, sports.  Work of breathing was elevated and sats were 70s.  RN had just placed patient on NRB before RT walked in.  BS were Rhonchus, patient's cough was weak.  Louretta Parma, NP put in order for NT suction, suction was perfomed and patient seemed a little better but still increased WOB.  PCCM showed up and assessed patient, put in order for ABG, and recommended putting patient on Bipap.  Another ABG will be performed at 5:30.  Will continue to assess patient.

## 2018-05-14 NOTE — Progress Notes (Signed)
Pt currently off BIPAP and resting comfortably. Will monitor progress

## 2018-05-14 NOTE — Significant Event (Signed)
Called to patient bedside for Respiratory Distress. Nurse reports that patient was on 3L [place], now with oxygen saturation 60%. Placed on NRB, with significant tachypnea. Rhonchi noted throughout. NT Suctioned with copious thick white secretions with no improvement to respiratory distress. CXR with increased size of right lung opacity (concerning for recurrent aspiration.) Wife updated on condition and expresses wishes for patient to remain a full code. PCCM Consulted.

## 2018-05-14 NOTE — Progress Notes (Signed)
Progress Note   Subjective  Patient received 2 units of PRBC yesterday, with lasix in between units, unfortunately developed respiratory distress overnight and placed on BiPAP. He stablized and they are taking BiPAP off this AM. Otherwise his Hgb responded to transfusion, although he had a dark BM this AM.   Objective   Vital signs in last 24 hours: Temp:  [97.6 F (36.4 C)-98.5 F (36.9 C)] 98.3 F (36.8 C) (02/12 0700) Pulse Rate:  [74-128] 100 (02/12 0700) Resp:  [20-38] 20 (02/12 0700) BP: (98-124)/(58-76) 122/64 (02/12 0700) SpO2:  [62 %-100 %] 97 % (02/12 0513) FiO2 (%):  [50 %] 50 % (02/12 0513) Last BM Date: 05/14/18 General:    white male in NAD Heart:  Regular rate and rhythm;  Lungs: Respirations even and unlabored, some coarse sounds anteriorly Abdomen:  Soft, nontender and nondistended.  Extremities:  Without edema. Neurologic:   grossly normal neurologically. Psych:  Cooperative. Normal mood and affect.  Intake/Output from previous day: 02/11 0701 - 02/12 0700 In: 2888.9 [P.O.:340; I.V.:1403.7; JASNK:539; IV Piggyback:501.2] Out: 300 [Urine:300] Intake/Output this shift: No intake/output data recorded.  Lab Results: Recent Labs    05/11/18 1626 05/13/18 0251 05/13/18 0505 05/13/18 1421 05/13/18 2211 05/14/18 0618  WBC 15.6* 7.8 8.2  --   --   --   HGB 7.6* 5.5* 5.5* 6.8* 7.6* 9.0*  HCT 26.0* 19.5* 19.1* 23.0* 24.8* 29.4*  PLT 182 130* 126*  --   --   --    BMET Recent Labs    05/11/18 1626 05/13/18 0251  NA 136 142  K 4.8 4.1  CL 104 115*  CO2 21* 21*  GLUCOSE 256* 104*  BUN 25* 18  CREATININE 1.15 1.00  CALCIUM 8.3* 7.8*   LFT Recent Labs    05/11/18 1626 05/13/18 0251  PROT 5.4*  --   ALBUMIN 2.5* 2.0*  AST 37  --   ALT 27  --   ALKPHOS 86  --   BILITOT 0.5  --    PT/INR No results for input(s): LABPROT, INR in the last 72 hours.  Studies/Results: Dg Chest Port 1 View  Result Date: 05/14/2018 CLINICAL DATA:  Acute  respiratory failure with shortness of breath EXAM: PORTABLE CHEST 1 VIEW COMPARISON:  Yesterday FINDINGS: Worsening interstitial opacity with Kerley lines. Low volume chest with increased hazy and streaky density on the right. Borderline heart size. Negative aortic and mediastinal contours. IMPRESSION: 1. Pulmonary edema. 2. Increased asymmetric right lung opacity that could be atelectasis or infection. Lung volumes are low. Electronically Signed   By: Monte Fantasia M.D.   On: 05/14/2018 04:41   Dg Chest Port 1 View  Result Date: 05/13/2018 CLINICAL DATA:  Pneumonia. EXAM: PORTABLE CHEST 1 VIEW COMPARISON:  Radiograph May 11, 2018. FINDINGS: Stable cardiomegaly. Atherosclerosis of thoracic aorta is noted. Stable elevated right hemidiaphragm is noted. Mild bibasilar subsegmental atelectasis is noted. No pneumothorax or pleural effusion is noted. Bony thorax is unremarkable. IMPRESSION: Mild bibasilar subsegmental atelectasis. Aortic Atherosclerosis (ICD10-I70.0). Electronically Signed   By: Marijo Conception, M.D.   On: 05/13/2018 08:51       Assessment / Plan:    85 y/o male with dementia, PVD, CHF, history of pneumonia in recent months, who presented with worsening anemia over the past few months, heme positive dark stools, Hgb of 5s initially. I had a lengthy discussion with the family yesterday about options and how aggressive they wanted to be with his workup.  Ultimately they wanted to proceed with endoscopy which was set for this AM.  Unfortunately he got volume overloaded last night with the transfusions and required BiPAP. Respiratory status seems better this AM but will cancel the EGD for today and see how he does the rest of today. If respiratory status stable we may do the exam tomorrow. Hgb seemed to respond to transfusion, would recheck Hgb later today and monitor for bleeding. Continue PPI in the interim, call with questions. Spoke with daughter who agreed with the plan.  [place] Cellar, MD [facility]

## 2018-05-14 NOTE — Progress Notes (Signed)
PROGRESS NOTE  Jose [name]  JZP:[id number] DOB: 03-22-1931 DOA: 05/11/2018 PCP: [name], MD   Brief Narrative: Jose [name] is an 85 y.o. male with a history of dementia, T2DM, chronic combined CHF, HTN, and aspiration pneumonia who presented to the ED from SNF on 2/9 with shortness of breath after breakfast, possibly due to recurrent aspiration pneumonia. He had been hospitalized for RSV, pneumonia, and UTI treatment, discharged 1/29, returned to the ED 3 hours later with suspected recurrent aspiration. Ultimately treated and discharged on 2/3 on dysphagia diet. During this hospitalization, while undergoing treatment for pneumonia he was found to have an acute drop in hemoglobin to 5.5. GI was consulted, recommending EGD. 2u PRBCs transfused with lasix with appropriate rise in hgb to 8.5. Unfortunately he developed respiratory distress due to pulmonary edema requiring BiPAP on 2/12. Palliative had been consulted earlier in the hospitalization and goals of care meeting with the medical staff was held. The patient remains a full code with the understanding that heroic efforts would not be likely to improve outcome. With lasix, respiratory status has stabilized. EGD is rescheduled for 2/13.  Assessment & Plan: Principal Problem:   Aspiration pneumonia ([place]) Active Problems:   Alzheimer disease ([place])   Type 2 diabetes mellitus with diabetic chronic kidney disease ([place])   Chronic combined systolic and diastolic congestive heart failure ([place])   Dysphagia   HTN (hypertension)   DNR (do not resuscitate) discussion   Eye irritation  Aspiration pneumonia, dysphagia: Recurrent. PCT 0.15. - Continue unasyn - SLP evaluations - NPO while needing BiPAP, will restart dysphagia diet. Pt's family reports improved tolerance with nectar-thickened liquids which will be ordered. - Appreciate ongoing palliative care input  Symptomatic acute blood loss anemia, suspected upper GI bleeding:  - Monitor serial  hgb's as pt's still having dark stools. Transfuse per protocol. If transfusing, would recommend lasix 40mg  IV per unit (20mg  IV given with development of pulmonary edema) - PPI gtt  Acute hypoxic respiratory failure: Due to pneumonia, recurrent aspiration, and acute pulmonary edema due to volume overload from transfusions. ABG improved w/BiPAP. - Continue supplemental oxygen prn. Stay in progressive care unit for now, but family would want intubation and ICU care were he to decompensate again.  Acute on chronic combined HFrEF:  - Given lasix 40mg  IV x1 this AM with improvement. Give additional 20mg  dose now, and restart home daily 20mg . Recheck CXR in AM. - Stop IVF - Monitor I/O, weights, BMP  T2DM:  - Continue sensitive SSI - Holding po medications  Dementia:  - Delirium precautions - Aricept, namenda  DVT prophylaxis: Lovenox Code Status: Full Family Communication: Wife, daughter, and son, held meeting for ~30 minutes today. Disposition Plan: Uncertain, remains a guarded prognosis  Consultants:   Pulmonology, CCM  GI  Palliative care team  Procedures:   EGD planned  BiPAP 2/12  Antimicrobials:  Unasyn   Subjective: Overnight events noted, developed acute respiratory distress, on BiPAP at time of initial evaluation. Confused, so no history provided by patient. Caretaker at bedside says he's a little more calm now.   Objective: Vitals:   05/14/18 0513 05/14/18 0700 05/14/18 0710 05/14/18 1113  BP: 117/73 122/64  105/64  Pulse: (!) 114 100 (!) 46 79  Resp: (!) 34 20 (!) 23 (!) 23  Temp: 97.7 F (36.5 C) 98.3 F (36.8 C)  97.6 F (36.4 C)  TempSrc:  Axillary  Oral  SpO2: 97%  100% 99%  Weight:  Height:        Intake/Output Summary (Last 24 hours) at 05/14/2018 1620 Last data filed at 05/14/2018 0730 Gross per 24 hour  Intake 1045.4 ml  Output 300 ml  Net 745.4 ml   Filed Weights   05/12/18 2100  Weight: 77 kg    Gen: Elderly male in  respiratory distress Pulm: Labored tachypnea on BiPAP. Crackles, rhonchi bilaterally.  CV: Regular tachycardia. No murmur, rub, or gallop. No JVD, Trace pedal edema. GI: Abdomen soft, non-tender, non-distended, with normoactive bowel sounds. No organomegaly or masses felt. Ext: Warm, no deformities Skin: No rashes, lesions or ulcers Neuro: Alert, not oriented, not cooperative with full exam but no definite focal neurological deficits. Psych: Judgement and insight appear impaired.  Data Reviewed: I have personally reviewed following labs and imaging studies  CBC: Recent Labs  Lab 05/11/18 1626 05/13/18 0251 05/13/18 0505 05/13/18 1421 05/13/18 2211 05/14/18 0618 05/14/18 1351  WBC 15.6* 7.8 8.2  --   --   --   --   NEUTROABS 13.2* 5.1 5.7  --   --   --   --   HGB 7.6* 5.5* 5.5* 6.8* 7.6* 9.0* 8.5*  HCT 26.0* 19.5* 19.1* 23.0* 24.8* 29.4* 27.7*  MCV 91.2 92.9 92.7  --   --   --   --   PLT 182 130* 126*  --   --   --   --    Basic Metabolic Panel: Recent Labs  Lab 05/11/18 1626 05/13/18 0251 05/14/18 1351  NA 136 142 143  K 4.8 4.1 3.6  CL 104 115* 111  CO2 21* 21* 23  GLUCOSE 256* 104* 189*  BUN 25* 18 17  CREATININE 1.15 1.00 1.24  CALCIUM 8.3* 7.8* 8.1*  MG  --  2.1  --   PHOS  --  3.1  --    GFR: Estimated Creatinine Clearance: 47.4 mL/min (by C-G formula based on SCr of 1.24 mg/dL). Liver Function Tests: Recent Labs  Lab 05/11/18 1626 05/13/18 0251  AST 37  --   ALT 27  --   ALKPHOS 86  --   BILITOT 0.5  --   PROT 5.4*  --   ALBUMIN 2.5* 2.0*   No results for input(s): LIPASE, AMYLASE in the last 168 hours. No results for input(s): AMMONIA in the last 168 hours. Coagulation Profile: No results for input(s): INR, PROTIME in the last 168 hours. Cardiac Enzymes: Recent Labs  Lab 05/11/18 1626  TROPONINI <0.03   BNP (last 3 results) No results for input(s): PROBNP in the last 8760 hours. HbA1C: No results for input(s): HGBA1C in the last 72  hours. CBG: Recent Labs  Lab 05/13/18 1935 05/13/18 2344 05/14/18 0344 05/14/18 0752 05/14/18 1111  GLUCAP 185* 104* 283* 187* 124*   Lipid Profile: No results for input(s): CHOL, HDL, LDLCALC, TRIG, CHOLHDL, LDLDIRECT in the last 72 hours. Thyroid Function Tests: No results for input(s): TSH, T4TOTAL, FREET4, T3FREE, THYROIDAB in the last 72 hours. Anemia Panel: No results for input(s): VITAMINB12, FOLATE, FERRITIN, TIBC, IRON, RETICCTPCT in the last 72 hours. Urine analysis:    Component Value Date/Time   COLORURINE YELLOW 05/01/2018 1109   [place] 05/01/2018 1109   LABSPEC 1.011 05/01/2018 1109   PHURINE 6.0 05/01/2018 1109   GLUCOSEU NEGATIVE 05/01/2018 1109   [place] (A) 05/01/2018 1109   BILIRUBINUR NEGATIVE 05/01/2018 1109   KETONESUR NEGATIVE 05/01/2018 1109   PROTEINUR NEGATIVE 05/01/2018 1109   UROBILINOGEN 0.2 06/24/2014 2112  NITRITE NEGATIVE 05/01/2018 1109   [place] 05/01/2018 1109   Recent Results (from the past 240 hour(s))  Culture, blood (routine x 2) Call MD if unable to obtain prior to antibiotics being given     Status: None (Preliminary result)   Collection Time: 05/11/18  8:30 PM  Result Value Ref Range Status   Specimen Description BLOOD LEFT ARM  Final   Special Requests   Final    BOTTLES DRAWN AEROBIC AND ANAEROBIC Blood Culture results may not be optimal due to an excessive volume of blood received in culture bottles   Culture   Final    NO GROWTH 2 DAYS Performed at [facility], [place] [address], [place], [place] [address]    Report Status PENDING  Incomplete  Culture, blood (routine x 2) Call MD if unable to obtain prior to antibiotics being given     Status: None (Preliminary result)   Collection Time: 05/11/18  8:50 PM  Result Value Ref Range Status   Specimen Description BLOOD LEFT HAND  Final   Special Requests   Final    BOTTLES DRAWN AEROBIC ONLY Blood Culture results may not be optimal due to an  excessive volume of blood received in culture bottles   Culture   Final    NO GROWTH 2 DAYS Performed at [facility], [place] [address], [place], [place] [address]    Report Status PENDING  Incomplete      Radiology Studies: Dg Chest Port 1 View  Result Date: 05/14/2018 CLINICAL DATA:  Acute respiratory failure with shortness of breath EXAM: PORTABLE CHEST 1 VIEW COMPARISON:  Yesterday FINDINGS: Worsening interstitial opacity with Kerley lines. Low volume chest with increased hazy and streaky density on the right. Borderline heart size. Negative aortic and mediastinal contours. IMPRESSION: 1. Pulmonary edema. 2. Increased asymmetric right lung opacity that could be atelectasis or infection. Lung volumes are low. Electronically Signed   By: Monte Fantasia M.D.   On: 05/14/2018 04:41   Dg Chest Port 1 View  Result Date: 05/13/2018 CLINICAL DATA:  Pneumonia. EXAM: PORTABLE CHEST 1 VIEW COMPARISON:  Radiograph May 11, 2018. FINDINGS: Stable cardiomegaly. Atherosclerosis of thoracic aorta is noted. Stable elevated right hemidiaphragm is noted. Mild bibasilar subsegmental atelectasis is noted. No pneumothorax or pleural effusion is noted. Bony thorax is unremarkable. IMPRESSION: Mild bibasilar subsegmental atelectasis. Aortic Atherosclerosis (ICD10-I70.0). Electronically Signed   By: Marijo Conception, M.D.   On: 05/13/2018 08:51    Scheduled Meds: . sodium chloride   Intravenous Once  . buPROPion  100 mg Oral Daily  . docusate sodium  100 mg Oral Daily  . donepezil  23 mg Oral Daily  . dorzolamide-timolol  1 drop Left Eye BID  . ferrous sulfate  325 mg Oral Q breakfast  . insulin aspart  0-9 Units Subcutaneous Q4H  . memantine  28 mg Oral Daily  . [START ON 05/16/2018] pantoprazole  40 mg Intravenous Q12H  . simvastatin  5 mg Oral q1800   Continuous Infusions: . ampicillin-sulbactam (UNASYN) IV 3 g (05/14/18 1216)  . pantoprozole (PROTONIX) infusion 8 mg/hr (05/14/18 0518)      LOS: 3 days   Time spent: 35 minutes.  Jose Pour, MD [facility] [email or web address] Password [email or web address] 05/14/2018, 4:20 PM

## 2018-05-14 NOTE — Consult Note (Signed)
NAME:  Jose [name], MRN:  [id number], DOB:  12-Nov-1930, LOS: 3 ADMISSION DATE:  05/11/2018, CONSULTATION DATE:  05/14/18 REFERRING MD:  Marthenia Rolling  CHIEF COMPLAINT:  Respiratory Distress   Brief History   Jose [name] is a 85 y.o. male who was admitted 2/9 with recurrent acute hypoxic respiratory failure initially thought to be due to recurrent aspiration.  Had anemia 2/11 and received 2u PRBC.  Early AM 2/12 had worsening hypoxia and increased WOB.  CXR with acute pulmonary edema.  PCCM consulted.  History of present illness   Pt is encephelopathic; therefore, this HPI is obtained from chart review. Jose [name] is a 85 y.o. male who has a PMH as outlined below (see "past medical history").  He presented to [facility] ED 2/9 with recurrent acute hypoxic respiratory failure felt to be due to recurrent aspiration and was started on unasyn (had admission for same 1/25; though, RSV positive at that time and was discharged to SNF on dysphagia 3 diet).  On 2/11, he was noted to have Hgb of 5.5 and was transfused 2u PRBC.  GI was called in consultation and were planning for EGD 2/12.  Unfortunately during early AM hours 2/12, he developed worsening hypoxia and worsening mental status.  He was placed on NRB and PCCM was called in consultation.  ABG was obtained and demonstrated acute hypoxemic and hypercapnic respiratory failure.  Given pt's age and underlying hx of chronic hypoxic respiratory failure, sCHF, dementia, I called pt's wife and daughter to discuss goals of care (previous palliative care notes noted).  CXR was obtained and showed findings concerning for acute pulmonary edema.  Decision was made to treat with BiPAP for now while family discusses options amongst themselves.  It was recommended that we treat pt medically with full supportive care, with limitations of no CPR / ACLS / intubation (DNR / DNI status).  Daughter somewhat in agreement but reluctant to make that decision without support of pt's  wife.  Past Medical History  Advanced dementia, HTN, sCHF, DM, depression.  Significant Hospital Events   2/9 > admit.  Consults:  Palliative Medicine. GI. PCCM.  Procedures:  None.  Significant Diagnostic Tests:  CXR 2/12 > pulmonary edema.  Micro Data:  Blood 2/9 >  Sputum 2/9 >   Antimicrobials:  Unasyn 2/9 >    Interim history/subjective:  On NRB.  Opens eyes but is confused.  Objective:  Blood pressure 98/60, pulse (!) 118, temperature 98 F (36.7 C), resp. rate (!) 38, height 6\' 1"  (1.854 m), weight 77 kg, SpO2 100 %.        Intake/Output Summary (Last 24 hours) at 05/14/2018 0457 Last data filed at 05/14/2018 0200 Gross per 24 hour  Intake 2888.94 ml  Output 300 ml  Net 2588.94 ml   Filed Weights   05/12/18 2100  Weight: 77 kg    Examination: General: Elderly male, in mild respiratory distress. Neuro: Somnolent, confused. HEENT: [place]/AT. Sclerae anicteric.  EOMI. Cardiovascular: RRR, no M/R/G.  Lungs: Respirations even and unlabored.  Coarse bilaterally. Abdomen: BS x 4, soft, NT/ND.  Musculoskeletal: No gross deformities, no edema.  Skin: Intact, warm, no rashes.  Assessment & Plan:   Acute on chronic hypoxic with acute hypercapnic respiratory failure - presumed due to acute pulmonary edema s/p 2u PRBC transfusion. - Start BiPAP. - 40mg  lasix now. - Bronchial hygiene. - Repeat ABG at 0600. - Follow CXR.  Concern for recurrent aspiration. - Continue unasyn and follow cultures.  GI bleed. - GI following, considering EGD 2/12. - Continue PPI.  Hx dysphagia. - SLP following.  Hx advanced dementia. - Palliative care following. - Discussed with wife and daughter and recommended DNR / DNI status (family to come to hospital this AM and address code status).  Hx HTN, HLD, sCHF. - 40mg  lasix as above. - Hold preadmission carvedilol, furosemide, lisinopril.  Hx DM. - SSI.  Best Practice:  Diet: NPO. Pain/Anxiety/Delirium protocol (if  indicated): N/A. VAP protocol (if indicated): N/A. DVT prophylaxis: SCD's. GI prophylaxis: PPI. Glucose control: SSI. Mobility: Bedrest. Code Status: Full. Family Communication: Wife and daughter updated over the phone.  Recommended DNR / DNI. Family to discuss and come to hospital this AM to address goals of care. Disposition: Progressive.  If does not respond to BiPAP or if family wishes for continued aggressive measures including full code, then will require transfer to ICU.  Labs   CBC: Recent Labs  Lab 05/11/18 1626 05/13/18 0251 05/13/18 0505 05/13/18 1421 05/13/18 2211  WBC 15.6* 7.8 8.2  --   --   NEUTROABS 13.2* 5.1 5.7  --   --   HGB 7.6* 5.5* 5.5* 6.8* 7.6*  HCT 26.0* 19.5* 19.1* 23.0* 24.8*  MCV 91.2 92.9 92.7  --   --   PLT 182 130* 126*  --   --    Basic Metabolic Panel: Recent Labs  Lab 05/11/18 1626 05/13/18 0251  NA 136 142  K 4.8 4.1  CL 104 115*  CO2 21* 21*  GLUCOSE 256* 104*  BUN 25* 18  CREATININE 1.15 1.00  CALCIUM 8.3* 7.8*  MG  --  2.1  PHOS  --  3.1   GFR: Estimated Creatinine Clearance: 58.8 mL/min (by C-G formula based on SCr of 1 mg/dL). Recent Labs  Lab 05/11/18 1626 05/12/18 0943 05/13/18 0251 05/13/18 0505  PROCALCITON  --  0.15  --   --   WBC 15.6*  --  7.8 8.2   Liver Function Tests: Recent Labs  Lab 05/11/18 1626 05/13/18 0251  AST 37  --   ALT 27  --   ALKPHOS 86  --   BILITOT 0.5  --   PROT 5.4*  --   ALBUMIN 2.5* 2.0*   No results for input(s): LIPASE, AMYLASE in the last 168 hours. No results for input(s): AMMONIA in the last 168 hours. ABG    Component Value Date/Time   PHART 7.245 (L) 05/14/2018 0435   PCO2ART 52.7 (H) 05/14/2018 0435   PO2ART 168 (H) 05/14/2018 0435   HCO3 22.2 05/14/2018 0435   TCO2 23 05/01/2018 0033   ACIDBASEDEF 4.1 (H) 05/14/2018 0435   O2SAT 99.2 05/14/2018 0435    Coagulation Profile: No results for input(s): INR, PROTIME in the last 168 hours. Cardiac Enzymes: Recent  Labs  Lab 05/11/18 1626  TROPONINI <0.03   HbA1C: Hemoglobin A1C  Date/Time Value Ref Range Status  12/03/2017 7.7  Final  09/05/2017 8.6  Final   CBG: Recent Labs  Lab 05/13/18 1141 05/13/18 1729 05/13/18 1935 05/13/18 2344 05/14/18 0344  GLUCAP 141* 178* 185* 104* 283*    Review of Systems:   Unable to obtain as pt is encephalopathic.  Past medical history  He,  has a past medical history of Abnormality of gait, Backache, unspecified, CHF (congestive heart failure) ([place]), Colon polyps, Dementia ([place]) (11/12/2012), Depression, Diabetes mellitus without complication ([place]), Diverticulosis, Essential and other specified forms of tremor, Hemorrhoids, History of bilateral hip replacements (11/12/2012), Memory loss, Other  persistent mental disorders due to conditions classified elsewhere, Pain in joint, pelvic region and thigh, Skin cancer of scalp, Spinal stenosis, lumbar region, without neurogenic claudication, and Unspecified hereditary and idiopathic peripheral neuropathy.   Surgical History    Past Surgical History:  Procedure Laterality Date  . CATARACT EXTRACTION, BILATERAL  2012  . JOINT REPLACEMENT    . RETINAL LASER PROCEDURE  2012   retinal wrinkle  . SKIN CANCER EXCISION    . TOTAL HIP ARTHROPLASTY Left 2000  . TOTAL HIP ARTHROPLASTY (aka REPLACEMENT) Right 2011     Social History   reports that he has quit smoking. His smoking use included cigarettes. He has never used smokeless tobacco. He reports current alcohol use of about 14.0 standard drinks of alcohol per week. He reports that he does not use drugs.   Family history   His family history includes Heart failure in his mother.   Allergies Allergies  Allergen Reactions  . Axona [Bacid] Other (See Comments)    Unknown per MAR  . Gabapentin Other (See Comments)    Hallucinations     Home meds  Prior to Admission medications   Medication Sig Start Date End Date Taking? Authorizing Provider  buPROPion  (WELLBUTRIN SR) 100 MG 12 hr tablet Take 100 mg by mouth daily. 11/23/16  Yes [provider]  carvedilol (COREG) 3.125 MG tablet Take 1 tablet (3.125 mg total) by mouth 2 (two) times daily with a meal. 11/30/16  Yes Theodis Blaze, MD  Cholecalciferol (VITAMIN D) 1000 UNITS capsule Take 1,000 Units by mouth daily.     Yes [provider]  docusate sodium (COLACE) 100 MG capsule Take 100 mg by mouth daily.   Yes [provider]  donepezil (ARICEPT) 23 MG TABS tablet Take 1 tablet (23 mg total) by mouth daily. 10/14/17  Yes Ward Givens, NP  dorzolamide-timolol (COSOPT) 22.3-6.8 MG/ML ophthalmic solution Place 1 drop into the left eye 2 (two) times daily.  09/20/12  Yes [provider]  ferrous sulfate 325 (65 FE) MG tablet Take 325 mg by mouth daily with breakfast.   Yes [provider]  Flaxseed, Linseed, (FLAXSEED OIL) 1000 MG CAPS Take 1,000 mg by mouth daily.     Yes [provider]  folic acid (FOLVITE) 161 MCG tablet Take 800 mcg by mouth every evening.    Yes [provider]  furosemide (LASIX) 20 MG tablet Take 1 tablet (20 mg total) by mouth daily. 05/05/18  Yes Roney Jaffe, MD  insulin glargine (LANTUS) 100 UNIT/ML injection Inject 10 Units into the skin at bedtime.    Yes [provider]  lisinopril (PRINIVIL,ZESTRIL) 5 MG tablet Take 1 tablet (5 mg total) by mouth daily. 12/01/16  Yes Theodis Blaze, MD  memantine (NAMENDA XR) 28 MG CP24 24 hr capsule Take 1 capsule (28 mg total) by mouth daily. 10/14/17  Yes Ward Givens, NP  metFORMIN (GLUCOPHAGE) 500 MG tablet Take 500 mg by mouth 2 (two) times daily with a meal.   Yes [provider]  Multiple Vitamin (MULTIVITAMIN) tablet Take 1 tablet by mouth daily.     Yes [provider]  Probiotic Product (ALIGN) 4 MG CAPS Take 4 mg by mouth daily.   Yes [provider]  simvastatin (ZOCOR) 5 MG tablet Take 5 mg by mouth daily.   Yes [provider]  sitaGLIPtin (JANUVIA) 25 MG tablet Take 1 tablet (25 mg total) by mouth daily. 01/08/18  Yes [name],  [name], NP     Montey Hora, [facility] Pager: [phone].  If no answer, [phone] 05/14/2018, 4:57 AM

## 2018-05-15 ENCOUNTER — Encounter (HOSPITAL_COMMUNITY): Payer: Self-pay | Admitting: Anesthesiology

## 2018-05-15 ENCOUNTER — Inpatient Hospital Stay (HOSPITAL_COMMUNITY): Payer: Medicare Other

## 2018-05-15 DIAGNOSIS — Z7189 Other specified counseling: Secondary | ICD-10-CM

## 2018-05-15 DIAGNOSIS — J9601 Acute respiratory failure with hypoxia: Secondary | ICD-10-CM

## 2018-05-15 DIAGNOSIS — J189 Pneumonia, unspecified organism: Secondary | ICD-10-CM

## 2018-05-15 DIAGNOSIS — J690 Pneumonitis due to inhalation of food and vomit: Principal | ICD-10-CM

## 2018-05-15 DIAGNOSIS — Z515 Encounter for palliative care: Secondary | ICD-10-CM

## 2018-05-15 DIAGNOSIS — G301 Alzheimer's disease with late onset: Secondary | ICD-10-CM

## 2018-05-15 LAB — CBC
HCT: 26.9 % — ABNORMAL LOW (ref 39.0–52.0)
Hemoglobin: 7.9 g/dL — ABNORMAL LOW (ref 13.0–17.0)
MCH: 26.8 pg (ref 26.0–34.0)
MCHC: 29.4 g/dL — ABNORMAL LOW (ref 30.0–36.0)
MCV: 91.2 fL (ref 80.0–100.0)
Platelets: 149 10*3/uL — ABNORMAL LOW (ref 150–400)
RBC: 2.95 MIL/uL — ABNORMAL LOW (ref 4.22–5.81)
RDW: 20.3 % — ABNORMAL HIGH (ref 11.5–15.5)
WBC: 10.2 10*3/uL (ref 4.0–10.5)
nRBC: 0 % (ref 0.0–0.2)

## 2018-05-15 LAB — GLUCOSE, CAPILLARY
GLUCOSE-CAPILLARY: 133 mg/dL — AB (ref 70–99)
Glucose-Capillary: 194 mg/dL — ABNORMAL HIGH (ref 70–99)
Glucose-Capillary: 121 mg/dL — ABNORMAL HIGH (ref 70–99)
Glucose-Capillary: 172 mg/dL — ABNORMAL HIGH (ref 70–99)
Glucose-Capillary: 190 mg/dL — ABNORMAL HIGH (ref 70–99)
Glucose-Capillary: 201 mg/dL — ABNORMAL HIGH (ref 70–99)

## 2018-05-15 LAB — BASIC METABOLIC PANEL
Anion gap: 9 (ref 5–15)
BUN: 16 mg/dL (ref 8–23)
CO2: 25 mmol/L (ref 22–32)
Calcium: 8 mg/dL — ABNORMAL LOW (ref 8.9–10.3)
Chloride: 112 mmol/L — ABNORMAL HIGH (ref 98–111)
Creatinine, Ser: 1.13 mg/dL (ref 0.61–1.24)
GFR calc Af Amer: >60 mL/min (ref >60)
GFR calc non Af Amer: 59 mL/min — ABNORMAL LOW (ref >60)
Glucose, Bld: 141 mg/dL — ABNORMAL HIGH (ref 70–99)
Potassium: 3.8 mmol/L (ref 3.5–5.1)
Sodium: 146 mmol/L — ABNORMAL HIGH (ref 135–145)

## 2018-05-15 MED ORDER — FUROSEMIDE 10 MG/ML IJ SOLN: 40.0000 mg | Freq: Once | INTRAMUSCULAR | Status: DC

## 2018-05-15 NOTE — Progress Notes (Signed)
  Speech Language Pathology Treatment: Dysphagia  Patient Details Name: NILE PRISK MRN: [id number] DOB: [birth year] Today's Date: 05/15/2018 Time: 6168-3729 SLP Time Calculation (min) (ACUTE ONLY): 28 min  Assessment / Plan / Recommendation Clinical Impression  Pt is now off BiPAP and diet has been changed to Dys 1 textures and nectar thick liquids. Family and caregiver report that they prefer this diet, as he was able to eat breakfast this morning without any coughing. Of note, aspiration on previous MBS was silent, but this was also with thin liquids. Education was provided about strategies to reduce ths risk of aspiration, use of thickener, safe positioning. Pt's wife also had questions about nutritional content of modified diet - reinforced that this diet will be more restrictive in terms of textures and options. Dietician consult may be helpful for some of those questions. Note that overall GOC decisions are still pending. SLP will continue to follow and educate as able.   HPI HPI: Pt is an 85 yo male admitted with recurrent PNA (third admission since the end of January). CXR showed R>L basilar opacities. Pt had two prior MBS (2018, 2020) that both recommended thin liquids. Most recent MBS 04/29/18 with recommendation for regular/thin with small bolus sizes, self-administered if possible, with second swallow to clear residue. Meds whole in puree. He most recently discharged on Dys 3 diet and thin liquids by cup after BSE 05/03/18, but per MD note he was changed to nectar thick liquids at SNF. PMH: dementia, DM, CHF, PNA      SLP Plan  Continue with current plan of care       Recommendations  Diet recommendations: Dysphagia 1 (puree);Nectar-thick liquid Liquids provided via: No straw;Cup Medication Administration: Whole meds with puree Supervision: Staff to assist with self feeding;Patient able to self feed;Full supervision/cueing for compensatory strategies Compensations: Minimize  environmental distractions;Small sips/bites;Slow rate;Multiple dry swallows after each bite/sip Postural Changes and/or Swallow Maneuvers: Upright 30-60 min after meal;Seated upright 90 degrees                Oral Care Recommendations: Oral care BID Follow up Recommendations: Skilled Nursing facility SLP Visit Diagnosis: Dysphagia, oropharyngeal phase (R13.12) Plan: Continue with current plan of care       GO                Venita Sheffield Sherell Christoffel 05/15/2018, 12:01 PM  Nuala Alpha, M.A. [place] [facility] Environmental education officer [phone] Office [phone]

## 2018-05-15 NOTE — Progress Notes (Signed)
Daily Progress Note   Patient Name: Jose [name]       Date: 05/15/2018 DOB: [birth year]  Age: 85 y.o. MRN#: [id number] Attending Physician: Patrecia Pour, MD Primary Care Physician: [name], MD Admit Date: 05/11/2018  Reason for Consultation/Follow-up: Establishing goals of care and Psychosocial/spiritual support  Subjective: Patient's endoscopy was cancelled this am due to a period of respiratory distress overnight necessitating Bipap.  Multiple family members at bedside.  Wife and daughter comment that patient still has an "RSV" cough.  We talked about aspirating on ones own saliva and the cough it creates.   Mr. Hartsell has lived at [facility] in the [place] for the past two years since his fall.  He has been wheel chair bound.   His wife reports he is happy there.    She describes his rapid decline since the beginning of January.  I asked how she will know if his illness is reversible vs whether he will continue to aspirate an decline further.  She said "I will know.  Its day by day".  Her daughter supported her comment saying "My mother will know".    PMT offered support and listening and promised to return tomorrow.   Assessment: Patient with advanced dementia, wheelchair bound prior to admission.  Now with recurrent aspiration pneumonia and GI bleed.  Unable to assess GIB today due to respiratory distress.     Patient Profile/HPI:  85 y.o. male   admitted on 05/11/2018 with past medical history significant ofadvanced dementia, DM2, CHF, HTN. Patient was admitted for PNAon 1/25, recorded as aspiration PNAthough RVP came back positive for RSV.  Discharged 1/29 and readmitted one day later on 1/30 with recurrent acute respfailure with hypoxia suspected to be  due to recurrent aspiration.  Continued physical, functional and cognitive decline over the past several months.  Family face advanced directive decisions, advanced directive decisions and anticipatory care needs.   Length of Stay: 4  Current Medications: Scheduled Meds:  . sodium chloride   Intravenous Once  . buPROPion  100 mg Oral Daily  . docusate sodium  100 mg Oral Daily  . donepezil  23 mg Oral Daily  . dorzolamide-timolol  1 drop Left Eye BID  . ferrous sulfate  325 mg Oral Q breakfast  . furosemide  20 mg Oral Daily  . insulin aspart  0-9 Units Subcutaneous Q4H  . memantine  28 mg Oral Daily  . [START ON 05/16/2018] pantoprazole  40 mg Intravenous Q12H  . simvastatin  5 mg Oral q1800    Continuous Infusions: . ampicillin-sulbactam (UNASYN) IV 3 g (05/15/18 0441)  . pantoprozole (PROTONIX) infusion 8 mg/hr (05/15/18 0833)    PRN Meds: naphazoline-glycerin, polyvinyl alcohol, RESOURCE THICKENUP CLEAR  Physical Exam        Elderly frail appearing demented gentleman, head hung down, skin peeling.  Awake, briefly responds to me. CV irreg with systolic murmur Resp no distress Abdomen soft, nt, nd  Vital Signs: BP 110/69   Pulse 78   Temp 98.5 F (36.9 C) (Oral)   Resp (!) 21   Ht 6\' 1"  (1.854 m)   Wt 77 kg   SpO2 98%   BMI 22.40 kg/m  SpO2: SpO2: 98 % O2 Device: O2 Device: Bi-PAP O2 Flow Rate: O2 Flow Rate (L/min): 4 L/min  Intake/output summary:   Intake/Output Summary (Last 24 hours) at 05/15/2018 1001 Last data filed at 05/15/2018 0900 Gross per 24 hour  Intake 1496.88 ml  Output 950 ml  Net 546.88 ml   LBM: Last BM Date: 05/14/18 Baseline Weight: Weight: 77 kg Most recent weight: Weight: 77 kg       Palliative Assessment/Data:  20%    Flowsheet Rows     Most Recent Value  Intake Tab  Referral Department  Hospitalist  Unit at Time of Referral  ER  Palliative Care Primary Diagnosis  Sepsis/Infectious Disease  Date Notified  05/12/18    Palliative Care Type  Return patient Palliative Care  Reason for referral  Clarify Goals of Care  Date of Admission  05/11/18  # of days IP prior to Palliative referral  1  Clinical Assessment  Psychosocial & Spiritual Assessment  Palliative Care Outcomes      Patient Active Problem List   Diagnosis Date Noted  . Eye irritation 05/11/2018  . DNR (do not resuscitate) discussion   . Palliative care by specialist   . CHF (congestive heart failure) ([place]) 05/01/2018  . Acute respiratory failure ([place]) 05/01/2018  . Aspiration pneumonia ([place]) 04/26/2018  . Constipation 04/15/2018  . HTN (hypertension) 04/15/2018  . Iron deficiency anemia 04/08/2018  . Actinic keratosis 06/21/2017  . Moderate protein-calorie malnutrition ([place]) 03/06/2017  . Unsteady gait 03/06/2017  . Urinary incontinence 12/31/2016  . Dysphagia 12/31/2016  . Dermatitis, seborrheic 12/14/2016  . Edema 12/14/2016  . Hyperlipidemia associated with type 2 diabetes mellitus ([place]) 12/06/2016  . Functional urinary incontinence 12/06/2016  . Chronic bilateral low back pain with bilateral sciatica 12/06/2016  . Chronic combined systolic and diastolic congestive heart failure ([place]) 12/06/2016  . Major depression, recurrent, chronic ([place]) 11/23/2016  . OAB (overactive bladder) 11/23/2016  . Dermatitis 08/23/2016  . External hemorrhoid 07/26/2016  . Depression with anxiety 07/09/2016  . Thrombocytopenia ([place]) 06/28/2016  . Closed left hip fracture, sequela 06/22/2016  . Type 2 diabetes mellitus with diabetic chronic kidney disease ([place]) 06/22/2016  . Hyperlipidemia LDL goal <70 06/22/2016  . Alzheimer disease ([place]) 11/12/2012  . History of bilateral hip replacements 11/12/2012    Palliative Care Plan    Recommendations/Plan:  I'm very concerned that his illness and recurrent hospitalizations have worsened his dementia to the point where he is going to continue to aspirate.  This was my first introduction to the  family.  PMT will continue to follow up with  the family - to discuss the trajectory of dementia and code status.  Code Status:  Full code  Prognosis:   < 3 months   Discharge Planning:  To Be Determined  Care plan was discussed with Wife at bedside.  Thank you for allowing the Palliative Medicine Team to assist in the care of this patient.  Total time spent:  35 min.     Greater than 50%  of this time was spent counseling and coordinating care related to the above assessment and plan.  Florentina Jenny, PA-C Palliative Medicine  Please contact Palliative MedicineTeam phone at [phone] for questions and concerns between 7 am - 7 pm.   Please see AMION for individual provider pager numbers.

## 2018-05-15 NOTE — Progress Notes (Signed)
Progress Note   Subjective  Patient had a dark BM overnight, but Hgb has been relatively stable with decreasing BUN. Was planning for EGD today however he again developed respiratory distress this AM and placed back on BiPAP. He denies any pain.    Objective   Vital signs in last 24 hours: Temp:  [97.4 F (36.3 C)-99.1 F (37.3 C)] 98.5 F (36.9 C) (02/13 0749) Pulse Rate:  [74-129] 87 (02/13 0749) Resp:  [22-47] 22 (02/13 0741) BP: (97-143)/(56-95) 97/73 (02/13 0749) SpO2:  [79 %-100 %] 97 % (02/13 0749) FiO2 (%):  [50 %] 50 % (02/13 0324) Last BM Date: 05/14/18 General:    white male lying in bed with BiPAP on  Heart:  Regular rate and rhythm Lungs: Respirations even and unlabored on Bipap Abdomen:  Soft, nontender and nondistended.  Extremities:  Without edema. Psych:  Cooperative. Normal mood and affect.  Intake/Output from previous day: 02/12 0701 - 02/13 0700 In: 1242.3 [I.V.:570; IV Piggyback:672.3] Out: 750 [Urine:750] Intake/Output this shift: Total I/O In: 92 [I.V.:92] Out: -   Lab Results: Recent Labs    05/13/18 0251 05/13/18 0505  05/14/18 0618 05/14/18 1351 05/14/18 2042  WBC 7.8 8.2  --   --   --  8.1  HGB 5.5* 5.5*   < > 9.0* 8.5* 8.3*  HCT 19.5* 19.1*   < > 29.4* 27.7* 27.2*  PLT 130* 126*  --   --   --  142*   < > = values in this interval not displayed.   BMET Recent Labs    05/13/18 0251 05/14/18 1351  NA 142 143  K 4.1 3.6  CL 115* 111  CO2 21* 23  GLUCOSE 104* 189*  BUN 18 17  CREATININE 1.00 1.24  CALCIUM 7.8* 8.1*   LFT Recent Labs    05/13/18 0251  ALBUMIN 2.0*   PT/INR No results for input(s): LABPROT, INR in the last 72 hours.  Studies/Results: Dg Chest Port 1 View  Result Date: 05/15/2018 CLINICAL DATA:  Acute respiratory failure EXAM: PORTABLE CHEST 1 VIEW COMPARISON:  05/14/2018 FINDINGS: Cardiomegaly. Improving bilateral opacities. Continued mild residual right perihilar opacities and bibasilar  atelectasis. Suspect small layering effusions. No acute bony abnormality. IMPRESSION: Improving bilateral airspace disease. Continued right perihilar opacities and bibasilar atelectasis. Question small effusions. Electronically Signed   By: Rolm Baptise M.D.   On: 05/15/2018 03:32   Dg Chest Port 1 View  Result Date: 05/14/2018 CLINICAL DATA:  Acute respiratory failure with shortness of breath EXAM: PORTABLE CHEST 1 VIEW COMPARISON:  Yesterday FINDINGS: Worsening interstitial opacity with Kerley lines. Low volume chest with increased hazy and streaky density on the right. Borderline heart size. Negative aortic and mediastinal contours. IMPRESSION: 1. Pulmonary edema. 2. Increased asymmetric right lung opacity that could be atelectasis or infection. Lung volumes are low. Electronically Signed   By: Monte Fantasia M.D.   On: 05/14/2018 04:41       Assessment / Plan:   85 y/o male with dementia, PVD, CHF, history of pneumonia in recent months, who presented with worsening anemia over the past few months, heme positive dark stools, Hgb of 5s initially. I had a lengthy discussion with the family previously about options and how aggressive they wanted to be with his workup. Ultimately they wanted to proceed with endoscopy. It was cancelled yesterday due to respiratory distress requiring BiPAP, and again this AM it occurred and now back on BiPAP.  Unfortunately he is  not ready for endoscopy from a respiratory standpoint. On PPI his Hgb has been relatively stable and BUN continues to downtrend, I don't think he is having any significant bleeding right now. I would continue PPI and we will reassess him tomorrow and discuss with the family how they want to proceed. If his respiratory status continues to be tenuous would hold off on pursuing endoscopy if he is otherwise stable and family is in agreement.   Fordoche Cellar, MD [facility]

## 2018-05-15 NOTE — Care Management Important Message (Signed)
Important Message  Patient Details  Name: Jose [name] MRN: [id number] Date of Birth: 02-Jan-1931   Medicare Important Message Given:  Yes    Carmelo Reidel 05/15/2018, 2:50 PM

## 2018-05-15 NOTE — Progress Notes (Signed)
Pharmacy Antibiotic Note  Jose [name] is a 85 y.o. male on day # 5 Unasyn for aspiration pneumonia.  Afebrile, WBC 8.1, renal function stable.  Plan:   Continue Unasyn 3gm IV q6hrs.   Follow renal function, final culture data, clinical progress and antibiotic plans.  Height: 6\' 1"  (185.4 cm) Weight: 169 lb 12.1 oz (77 kg) IBW/kg (Calculated) : 79.9  Temp (24hrs), Avg:98.2 F (36.8 C), Min:97.4 F (36.3 C), Max:99.1 F (37.3 C)  Recent Labs  Lab 05/11/18 1626 05/13/18 0251 05/13/18 0505 05/14/18 1351 05/14/18 2042 05/15/18 0734  WBC 15.6* 7.8 8.2  --  8.1 10.2  CREATININE 1.15 1.00  --  1.24  --  1.13    Estimated Creatinine Clearance: 52.1 mL/min (by C-G formula based on SCr of 1.13 mg/dL).    Allergies  Allergen Reactions  . Axona [Bacid] Other (See Comments)    Unknown per MAR  . Gabapentin Other (See Comments)    Hallucinations    Antimicrobials this admission:   Unasyn 2/9>>  Microbiology results:  2/9 Blood x 2 - no growth x 3 days to date  2/9 sputum: no sample  2/9 Strep antigen - not collected   2/9 HIV non-reactive   Thank you for allowing pharmacy to be a part of this patient's care.  Arty Baumgartner, [place] Pager: [phone] or phone: [phone] 05/15/2018 12:35 PM

## 2018-05-15 NOTE — Progress Notes (Signed)
PROGRESS NOTE  Jose [name]  FHL:[id number] DOB: 17-Dec-1930 DOA: 05/11/2018 PCP: [name], MD   Brief Narrative: Jose [name] is an 85 y.o. male with a history of dementia, T2DM, chronic combined CHF, HTN, and aspiration pneumonia who presented to the ED from SNF on 2/9 with shortness of breath after breakfast, possibly due to recurrent aspiration pneumonia. He had been hospitalized for RSV, pneumonia, and UTI treatment, discharged 1/29, returned to the ED 3 hours later with suspected recurrent aspiration. Ultimately treated and discharged on 2/3 on dysphagia diet. During this hospitalization, while undergoing treatment for pneumonia he was found to have an acute drop in hemoglobin to 5.5. GI was consulted, recommending EGD. 2u PRBCs transfused with lasix with appropriate rise in hgb to 8.5. Unfortunately he developed respiratory distress due to pulmonary edema requiring BiPAP on 2/12. Palliative had been consulted earlier in the hospitalization and goals of care meeting with the medical staff was held. The patient remains a full code with the understanding that heroic efforts would not be likely to improve outcome. With lasix, respiratory status has stabilized. EGD is rescheduled for 2/13.  Assessment & Plan: Principal Problem:   Aspiration pneumonia ([place]) Active Problems:   Alzheimer disease ([place])   Type 2 diabetes mellitus with diabetic chronic kidney disease ([place])   Chronic combined systolic and diastolic congestive heart failure (H[facility])   Dysphagia   HTN (hypertension)   Acute respiratory failure ([place])   DNR (do not resuscitate) discussion   Eye irritation   Recurrent pneumonia   Palliative care encounter  Aspiration pneumonia, dysphagia: Recurrent. PCT 0.15. - Continue unasyn - SLP evaluations ongoing - Pt's family reports improved tolerance with nectar-thickened liquids which will be ordered. - Appreciate ongoing palliative care input  Symptomatic acute blood loss anemia,  suspected upper GI bleeding:  - Serial hgb trending downward, but not requiring transfusion today. Await decision Re: timing of EGD per GI. Will make NPO p MN in event they decide to perform EGD 2/14. - Continue PPI gtt  Acute hypoxic respiratory failure: Due to pneumonia, recurrent aspiration, and acute pulmonary edema due to volume overload from transfusions. ABG improved w/BiPAP. - Continue supplemental oxygen prn.  - Stay in progressive care unit for prn BiPAP.  - Goals of care discussions ongoing, appreciate both CCM and palliative care assistance.  Acute on chronic combined HFrEF:  - CXR with improved aeration, still has some crackles and had respiratory distress this AM. Will give IV lasix x1 this PM (received po lasix already this AM) - Monitor I/O, weights, BMP  T2DM:  - Continue sensitive SSI. At inpatient goal. - Holding po medications  Dementia:  - Delirium precautions - Aricept, namenda  DVT prophylaxis: Lovenox Code Status: Full Family Communication: Wife, daughter, son at bedside this AM. Disposition Plan: Uncertain, remains a guarded prognosis  Consultants:   PCCM  GI  Palliative care team  Procedures:   EGD planned  BiPAP 2/12, 2/13  Antimicrobials:  Unasyn   Subjective: Again had respiratory distress requiring brief BiPAP this AM but overall much improved in the past 24 hours. At his mental baseline per family. Denies any pain or trouble breathing. Family states diet modification has improved his intake, no coughing with swallowing.  Objective: Vitals:   05/15/18 0749 05/15/18 0749 05/15/18 0800 05/15/18 1203  BP: 121/69 97/73 110/69 115/74  Pulse: 86 87 78 80  Resp: 19 (!) 22 (!) 21 19  Temp:  98.5 F (36.9 C)  98.6 F (37  C)  TempSrc:  Oral  Oral  SpO2: 96% 97% 98% 99%  Weight:      Height:        Intake/Output Summary (Last 24 hours) at 05/15/2018 1308 Last data filed at 05/15/2018 0900 Gross per 24 hour  Intake 1496.88 ml  Output  950 ml  Net 546.88 ml   Filed Weights   05/12/18 2100  Weight: 77 kg   Gen: Elderly male in no acute distress Pulm: Nonlabored, tachypneic with crackles at left base. CV: Regular tachycardia, no murmur, rub, or gallop. No JVD, trace dependent edema. GI: Abdomen soft, non-tender, non-distended, with normoactive bowel sounds.  Ext: Warm, no deformities Skin: No new rashes, lesions or ulcers on visualized skin. Neuro: Alert, not oriented. No focal neurological deficits. Psych: Judgement and insight appear impaired. Behavior is appropriate.    Data Reviewed: I have personally reviewed following labs and imaging studies  CBC: Recent Labs  Lab 05/11/18 1626 05/13/18 0251 05/13/18 0505  05/13/18 2211 05/14/18 0618 05/14/18 1351 05/14/18 2042 05/15/18 0734  WBC 15.6* 7.8 8.2  --   --   --   --  8.1 10.2  NEUTROABS 13.2* 5.1 5.7  --   --   --   --   --   --   HGB 7.6* 5.5* 5.5*   < > 7.6* 9.0* 8.5* 8.3* 7.9*  HCT 26.0* 19.5* 19.1*   < > 24.8* 29.4* 27.7* 27.2* 26.9*  MCV 91.2 92.9 92.7  --   --   --   --  89.5 91.2  PLT 182 130* 126*  --   --   --   --  142* 149*   < > = values in this interval not displayed.   Basic Metabolic Panel: Recent Labs  Lab 05/11/18 1626 05/13/18 0251 05/14/18 1351 05/15/18 0734  NA 136 142 143 146*  K 4.8 4.1 3.6 3.8  CL 104 115* 111 112*  CO2 21* 21* 23 25  GLUCOSE 256* 104* 189* 141*  BUN 25* 18 17 16   CREATININE 1.15 1.00 1.24 1.13  CALCIUM 8.3* 7.8* 8.1* 8.0*  MG  --  2.1  --   --   PHOS  --  3.1  --   --    GFR: Estimated Creatinine Clearance: 52.1 mL/min (by C-G formula based on SCr of 1.13 mg/dL). Liver Function Tests: Recent Labs  Lab 05/11/18 1626 05/13/18 0251  AST 37  --   ALT 27  --   ALKPHOS 86  --   BILITOT 0.5  --   PROT 5.4*  --   ALBUMIN 2.5* 2.0*   No results for input(s): LIPASE, AMYLASE in the last 168 hours. No results for input(s): AMMONIA in the last 168 hours. Coagulation Profile: No results for  input(s): INR, PROTIME in the last 168 hours. Cardiac Enzymes: Recent Labs  Lab 05/11/18 1626  TROPONINI <0.03   BNP (last 3 results) No results for input(s): PROBNP in the last 8760 hours. HbA1C: No results for input(s): HGBA1C in the last 72 hours. CBG: Recent Labs  Lab 05/14/18 1951 05/14/18 2354 05/15/18 0407 05/15/18 0745 05/15/18 1202  GLUCAP 209* 94 194* 121* 172*   Lipid Profile: No results for input(s): CHOL, HDL, LDLCALC, TRIG, CHOLHDL, LDLDIRECT in the last 72 hours. Thyroid Function Tests: No results for input(s): TSH, T4TOTAL, FREET4, T3FREE, THYROIDAB in the last 72 hours. Anemia Panel: No results for input(s): VITAMINB12, FOLATE, FERRITIN, TIBC, IRON, RETICCTPCT in the last 72 hours. Urine  analysis:    Component Value Date/Time   COLORURINE YELLOW 05/01/2018 1109   APPEARANCEUR CLEAR 05/01/2018 1109   LABSPEC 1.011 05/01/2018 1109   PHURINE 6.0 05/01/2018 1109   GLUCOSEU NEGATIVE 05/01/2018 1109   HGBUR LARGE (A) 05/01/2018 1109   [place] 05/01/2018 1109   [place] 05/01/2018 1109   PROTEINUR NEGATIVE 05/01/2018 1109   UROBILINOGEN 0.2 06/24/2014 2112   NITRITE NEGATIVE 05/01/2018 1109   LEUKOCYTESUR NEGATIVE 05/01/2018 1109   Recent Results (from the past 240 hour(s))  Culture, blood (routine x 2) Call MD if unable to obtain prior to antibiotics being given     Status: None (Preliminary result)   Collection Time: 05/11/18  8:30 PM  Result Value Ref Range Status   Specimen Description BLOOD LEFT ARM  Final   Special Requests   Final    BOTTLES DRAWN AEROBIC AND ANAEROBIC Blood Culture results may not be optimal due to an excessive volume of blood received in culture bottles   Culture   Final    NO GROWTH 3 DAYS Performed at [facility], [place] [address], [place], [place] [address]    Report Status PENDING  Incomplete  Culture, blood (routine x 2) Call MD if unable to obtain prior to antibiotics being given     Status:  None (Preliminary result)   Collection Time: 05/11/18  8:50 PM  Result Value Ref Range Status   Specimen Description BLOOD LEFT HAND  Final   Special Requests   Final    BOTTLES DRAWN AEROBIC ONLY Blood Culture results may not be optimal due to an excessive volume of blood received in culture bottles   Culture   Final    NO GROWTH 3 DAYS Performed at [facility], [place] [address], [place], [place] [address]    Report Status PENDING  Incomplete      Radiology Studies: Dg Chest Port 1 View  Result Date: 05/15/2018 CLINICAL DATA:  Acute respiratory failure EXAM: PORTABLE CHEST 1 VIEW COMPARISON:  05/14/2018 FINDINGS: Cardiomegaly. Improving bilateral opacities. Continued mild residual right perihilar opacities and bibasilar atelectasis. Suspect small layering effusions. No acute bony abnormality. IMPRESSION: Improving bilateral airspace disease. Continued right perihilar opacities and bibasilar atelectasis. Question small effusions. Electronically Signed   By: Rolm Baptise M.D.   On: 05/15/2018 03:32   Dg Chest Port 1 View  Result Date: 05/14/2018 CLINICAL DATA:  Acute respiratory failure with shortness of breath EXAM: PORTABLE CHEST 1 VIEW COMPARISON:  Yesterday FINDINGS: Worsening interstitial opacity with Kerley lines. Low volume chest with increased hazy and streaky density on the right. Borderline heart size. Negative aortic and mediastinal contours. IMPRESSION: 1. Pulmonary edema. 2. Increased asymmetric right lung opacity that could be atelectasis or infection. Lung volumes are low. Electronically Signed   By: Monte Fantasia M.D.   On: 05/14/2018 04:41    Scheduled Meds: . buPROPion  100 mg Oral Daily  . docusate sodium  100 mg Oral Daily  . donepezil  23 mg Oral Daily  . dorzolamide-timolol  1 drop Left Eye BID  . ferrous sulfate  325 mg Oral Q breakfast  . furosemide  40 mg Intravenous Once  . insulin aspart  0-9 Units Subcutaneous Q4H  . memantine  28 mg Oral Daily  .  [START ON 05/16/2018] pantoprazole  40 mg Intravenous Q12H  . simvastatin  5 mg Oral q1800   Continuous Infusions: . ampicillin-sulbactam (UNASYN) IV 3 g (05/15/18 1155)  . pantoprozole (PROTONIX) infusion  8 mg/hr (05/15/18 0833)     LOS: 4 days   Time spent: 35 minutes.  Patrecia Pour, MD [facility] [email or web address] Password [email or web address] 05/15/2018, 1:08 PM

## 2018-05-15 NOTE — Anesthesia Preprocedure Evaluation (Deleted)
Anesthesia Evaluation    Reviewed: Allergy & Precautions, Patient's Chart, lab work & pertinent test results  Airway        Dental   Pulmonary neg pulmonary ROS, former smoker,           Cardiovascular hypertension, +CHF    TTE 04/2018 EF 35-40%, diffuse hypokinesis, mild AS, mild AI   Neuro/Psych PSYCHIATRIC DISORDERS Anxiety Depression Dementia negative neurological ROS     GI/Hepatic negative GI ROS, Neg liver ROS,   Endo/Other  negative endocrine ROSdiabetes, Type 2  Renal/GU negative Renal ROS  negative genitourinary   Musculoskeletal negative musculoskeletal ROS (+)   Abdominal   Peds  Hematology  (+) Blood dyscrasia (Hgb 8.3), anemia ,   Anesthesia Other Findings Admitted on 2/9 with aspiration pneumonia. During admission, Hgb acutely dropped to 5.5, received 2U RBCS, then developed respiratory distress 2/2 pulmonary edema requiring BiPAP  Reproductive/Obstetrics                             Anesthesia Physical Anesthesia Plan  ASA: IV  Anesthesia Plan: MAC   Post-op Pain Management:    Induction: Intravenous  PONV Risk Score and Plan: 1 and Propofol infusion and Treatment may vary due to age or medical condition  Airway Management Planned: Natural Airway  Additional Equipment:   Intra-op Plan:   Post-operative Plan:   Informed Consent: I have reviewed the patients History and Physical, chart, labs and discussed the procedure including the risks, benefits and alternatives for the proposed anesthesia with the patient or authorized representative who has indicated his/her understanding and acceptance.     Dental advisory given  Plan Discussed with: CRNA  Anesthesia Plan Comments:         Anesthesia Quick Evaluation

## 2018-05-15 NOTE — Progress Notes (Addendum)
NAME:  Jose [name], MRN:  [id number], DOB:  1930-08-10, LOS: 4 ADMISSION DATE:  05/11/2018, CONSULTATION DATE:  05/14/18 REFERRING MD:  Marthenia Rolling  CHIEF COMPLAINT:  Respiratory Distress   Brief History   Jose [name] is a 85 y.o. male who was admitted 2/9 with recurrent acute hypoxic respiratory failure initially thought to be due to recurrent aspiration.  Had anemia 2/11 and received 2u PRBC.  Early AM 2/12 had worsening hypoxia and increased WOB.  CXR with acute pulmonary edema.  PCCM consulted 2/12   History of present illness    Jose [name] is a 85 y.o. male who has a PMH as outlined below (see "past medical history").  He presented to [facility] ED 2/9 with recurrent acute hypoxic respiratory failure felt to be due to recurrent aspiration and was started on unasyn (had admission for same 1/25; though, RSV positive at that time and was discharged to SNF on dysphagia 3 diet).  On 2/11, he was noted to have Hgb of 5.5 and was transfused 2u PRBC.  GI was called in consultation and were planning for EGD 2/12.  Unfortunately during early AM hours 2/12, he developed worsening hypoxia and worsening mental status.  He was placed on NRB and PCCM was called in consultation.  Past Medical History  Advanced dementia, HTN, sCHF, DM, depression.  Significant Hospital Events   2/9 > admit.  Consults:  Palliative Medicine. GI. PCCM.  Procedures:  None.  Significant Diagnostic Tests:  CXR 2/12 > pulmonary edema.  Micro Data:  Blood 2/9 >  Sputum 2/9 >   Antimicrobials:  Unasyn 2/9 >    Interim history/subjective:  This AM with respiratory distress requiring BiPAP   Objective:  Blood pressure 110/69, pulse 78, temperature 98.5 F (36.9 C), temperature source Oral, resp. rate (!) 21, height 6\' 1"  (1.854 m), weight 77 kg, SpO2 98 %.    FiO2 (%):  [50 %] 50 %   Intake/Output Summary (Last 24 hours) at 05/15/2018 7412 Last data filed at 05/15/2018 0900 Gross per 24 hour  Intake 1496.88 ml  Output  950 ml  Net 546.88 ml   Filed Weights   05/12/18 2100  Weight: 77 kg    Examination: General: Elderly male, laying in bed, mild respiratory distress  Neuro: Lethargic, arouses with stimulation, confused  HEENT: [place]/AT. Sclerae anicteric.  Cardiovascular: RRR, no M/R/G.  Lungs: Coarse breath sounds, crackles to bases, no wheeze  Abdomen: BS x 4, soft, NT/ND.  Musculoskeletal: -edema  Skin: Intact, warm, no rashes.  Assessment & Plan:   Acute on chronic hypoxic with acute hypercapnic respiratory failure - presumed secondary to acute pulmonary edema and aspiration PNA Plan  - BiPAP PRN, Supplemental Oxygen to Maintain Saturation >92  - Pulmonary Hygiene  - Trend CXR   H/O HTN, HLD, sCHF. - Continue to diuresis as tolerates  - Hold preadmission carvedilol, furosemide, lisinopril.  Concern for recurrent aspiration in setting of Chronic Dysphagia  Plan  - Continue unasyn and follow cultures. - Speech Following   GI bleed. Plan  - GI following > Remains unstable respiratory wise to undergo at this time  - Continue PPI.  Advanced dementia Plan  - Palliative care following > Dr. Lavina Hamman Discussed with daughter and wife. Will proceed with DNR at this time   DM. Plan  -Trend Glucose  - SSI.  PCCM will sign off. Please re-consult if needed.   Best Practice:  Diet: NPO. DVT prophylaxis: SCD's. GI prophylaxis: PPI. Glucose  control: SSI. Mobility: Bedrest. Code Status: Full.  Labs   CBC: Recent Labs  Lab 05/11/18 1626 05/13/18 0251 05/13/18 0505  05/13/18 2211 05/14/18 0618 05/14/18 1351 05/14/18 2042 05/15/18 0734  WBC 15.6* 7.8 8.2  --   --   --   --  8.1 10.2  NEUTROABS 13.2* 5.1 5.7  --   --   --   --   --   --   HGB 7.6* 5.5* 5.5*   < > 7.6* 9.0* 8.5* 8.3* 7.9*  HCT 26.0* 19.5* 19.1*   < > 24.8* 29.4* 27.7* 27.2* 26.9*  MCV 91.2 92.9 92.7  --   --   --   --  89.5 91.2  PLT 182 130* 126*  --   --   --   --  142* 149*   < > = values in this interval not  displayed.   Basic Metabolic Panel: Recent Labs  Lab 05/11/18 1626 05/13/18 0251 05/14/18 1351 05/15/18 0734  NA 136 142 143 146*  K 4.8 4.1 3.6 3.8  CL 104 115* 111 112*  CO2 21* 21* 23 25  GLUCOSE 256* 104* 189* 141*  BUN 25* 18 17 16   CREATININE 1.15 1.00 1.24 1.13  CALCIUM 8.3* 7.8* 8.1* 8.0*  MG  --  2.1  --   --   PHOS  --  3.1  --   --    GFR: Estimated Creatinine Clearance: 52.1 mL/min (by C-G formula based on SCr of 1.13 mg/dL). Recent Labs  Lab 05/12/18 0943 05/13/18 0251 05/13/18 0505 05/14/18 2042 05/15/18 0734  PROCALCITON 0.15  --   --   --   --   WBC  --  7.8 8.2 8.1 10.2   Liver Function Tests: Recent Labs  Lab 05/11/18 1626 05/13/18 0251  AST 37  --   ALT 27  --   ALKPHOS 86  --   BILITOT 0.5  --   PROT 5.4*  --   ALBUMIN 2.5* 2.0*   No results for input(s): LIPASE, AMYLASE in the last 168 hours. No results for input(s): AMMONIA in the last 168 hours. ABG    Component Value Date/Time   PHART 7.383 05/14/2018 0541   PCO2ART 37.9 05/14/2018 0541   PO2ART 94.3 05/14/2018 0541   HCO3 22.2 05/14/2018 0541   TCO2 23 05/01/2018 0033   ACIDBASEDEF 2.2 (H) 05/14/2018 0541   O2SAT 97.9 05/14/2018 0541    Coagulation Profile: No results for input(s): INR, PROTIME in the last 168 hours. Cardiac Enzymes: Recent Labs  Lab 05/11/18 1626  TROPONINI <0.03   HbA1C: Hemoglobin A1C  Date/Time Value Ref Range Status  12/03/2017 7.7  Final  09/05/2017 8.6  Final   CBG: Recent Labs  Lab 05/14/18 1621 05/14/18 1951 05/14/18 2354 05/15/18 0407 05/15/18 0745  GLUCAP 180* 209* 94 194* 121*    Review of Systems:   Unable to obtain as pt is encephalopathic.  Past medical history  He,  has a past medical history of Abnormality of gait, Backache, unspecified, CHF (congestive heart failure) ([place]), Colon polyps, Dementia ([place]) (11/12/2012), Depression, Diabetes mellitus without complication ([place]), Diverticulosis, Essential and other specified forms  of tremor, Hemorrhoids, History of bilateral hip replacements (11/12/2012), Memory loss, Other persistent mental disorders due to conditions classified elsewhere, Pain in joint, pelvic region and thigh, Skin cancer of scalp, Spinal stenosis, lumbar region, without neurogenic claudication, and Unspecified hereditary and idiopathic peripheral neuropathy.   Surgical History    Past Surgical History:  Procedure Laterality Date  . CATARACT EXTRACTION, BILATERAL  2012  . JOINT REPLACEMENT    . RETINAL LASER PROCEDURE  2012   retinal wrinkle  . SKIN CANCER EXCISION    . TOTAL HIP ARTHROPLASTY Left 2000  . TOTAL HIP ARTHROPLASTY (aka REPLACEMENT) Right 2011     Social History   reports that he has quit smoking. His smoking use included cigarettes. He has never used smokeless tobacco. He reports current alcohol use of about 14.0 standard drinks of alcohol per week. He reports that he does not use drugs.   Family history   His family history includes Heart failure in his mother.   Allergies Allergies  Allergen Reactions  . Axona [Bacid] Other (See Comments)    Unknown per MAR  . Gabapentin Other (See Comments)    Hallucinations     Home meds  Prior to Admission medications   Medication Sig Start Date End Date Taking? Authorizing Provider  buPROPion (WELLBUTRIN SR) 100 MG 12 hr tablet Take 100 mg by mouth daily. 11/23/16  Yes [provider]  carvedilol (COREG) 3.125 MG tablet Take 1 tablet (3.125 mg total) by mouth 2 (two) times daily with a meal. 11/30/16  Yes Theodis Blaze, MD  Cholecalciferol (VITAMIN D) 1000 UNITS capsule Take 1,000 Units by mouth daily.     Yes [provider]  docusate sodium (COLACE) 100 MG capsule Take 100 mg by mouth daily.   Yes [provider]  donepezil (ARICEPT) 23 MG TABS tablet Take 1 tablet (23 mg total) by mouth daily. 10/14/17  Yes Ward Givens, NP  dorzolamide-timolol (COSOPT) 22.3-6.8 MG/ML ophthalmic solution Place 1 drop  into the left eye 2 (two) times daily.  09/20/12  Yes [provider]  ferrous sulfate 325 (65 FE) MG tablet Take 325 mg by mouth daily with breakfast.   Yes [provider]  Flaxseed, Linseed, (FLAXSEED OIL) 1000 MG CAPS Take 1,000 mg by mouth daily.     Yes [provider]  folic acid (FOLVITE) 960 MCG tablet Take 800 mcg by mouth every evening.    Yes [provider]  furosemide (LASIX) 20 MG tablet Take 1 tablet (20 mg total) by mouth daily. 05/05/18  Yes Roney Jaffe, MD  insulin glargine (LANTUS) 100 UNIT/ML injection Inject 10 Units into the skin at bedtime.    Yes [provider]  lisinopril (PRINIVIL,ZESTRIL) 5 MG tablet Take 1 tablet (5 mg total) by mouth daily. 12/01/16  Yes Theodis Blaze, MD  memantine (NAMENDA XR) 28 MG CP24 24 hr capsule Take 1 capsule (28 mg total) by mouth daily. 10/14/17  Yes Ward Givens, NP  metFORMIN (GLUCOPHAGE) 500 MG tablet Take 500 mg by mouth 2 (two) times daily with a meal.   Yes [provider]  Multiple Vitamin (MULTIVITAMIN) tablet Take 1 tablet by mouth daily.     Yes [provider]  Probiotic Product (ALIGN) 4 MG CAPS Take 4 mg by mouth daily.   Yes [provider]  simvastatin (ZOCOR) 5 MG tablet Take 5 mg by mouth daily.   Yes [provider]  sitaGLIPtin (JANUVIA) 25 MG tablet Take 1 tablet (25 mg total) by mouth daily. 01/08/18  Yes [name], [name], NP   Attending Note:  85 year old male with acute on chronic respiratory failure who presents to PCCM with an upper GI bleed and progressive respiratory failure.  On exam, he is confused on BiPAP but following command with clear lungs  bilaterally.  I reviewed CXR myself, pulmonary edema noted.  Discussed with PCCM-NP.  Family originally refused to speak of code status.  But after a 45 minute discussion about options and goals of care decision was made to proceed with LCB with no CPR, cardioversion and intubation.   Transfuse as needed.  Once patient is stable enough from a respiratory standpoint then can EGD, if not then will need to examine on a day by day bases.  But no intubation for the procedure as he will be unlikely to comprehend weaning and be able to extubate successfully given pulmonary disease as well.  This was relayed to Dr. Bonner Puna and PCCM will sign off, please call back if needed.  The patient is critically ill with multiple organ systems failure and requires high complexity decision making for assessment and support, frequent evaluation and titration of therapies, application of advanced monitoring technologies and extensive interpretation of multiple databases.   Critical Care Time devoted to patient care services described in this note is  35  Minutes. This time reflects time of care of this signee Dr Jennet Maduro. This critical care time does not reflect procedure time, or teaching time or supervisory time of PA/NP/Med student/Med Resident etc but could involve care discussion time.  Rush Farmer, M.D. [facility] Pulmonary/Critical Care Medicine. Pager: [phone]. After hours pager: [phone].

## 2018-05-16 ENCOUNTER — Encounter (HOSPITAL_COMMUNITY)
Admission: EM | Disposition: A | Discharge status: Skilled Nursing Facility | Payer: Self-pay | Source: Skilled Nursing Facility | Attending: Internal Medicine

## 2018-05-16 DIAGNOSIS — Z515 Encounter for palliative care: Secondary | ICD-10-CM

## 2018-05-16 LAB — BASIC METABOLIC PANEL
Anion gap: 8 (ref 5–15)
BUN: 16 mg/dL (ref 8–23)
CO2: 24 mmol/L (ref 22–32)
CREATININE: 0.99 mg/dL (ref 0.61–1.24)
Calcium: 7.9 mg/dL — ABNORMAL LOW (ref 8.9–10.3)
Chloride: 115 mmol/L — ABNORMAL HIGH (ref 98–111)
GFR calc Af Amer: >60 mL/min (ref >60)
GFR calc non Af Amer: >60 mL/min (ref >60)
Glucose, Bld: 127 mg/dL — ABNORMAL HIGH (ref 70–99)
Potassium: 3.7 mmol/L (ref 3.5–5.1)
Sodium: 147 mmol/L — ABNORMAL HIGH (ref 135–145)

## 2018-05-16 LAB — GLUCOSE, CAPILLARY
GLUCOSE-CAPILLARY: 231 mg/dL — AB (ref 70–99)
GLUCOSE-CAPILLARY: 172 mg/dL — AB (ref 70–99)
Glucose-Capillary: 128 mg/dL — ABNORMAL HIGH (ref 70–99)
Glucose-Capillary: 118 mg/dL — ABNORMAL HIGH (ref 70–99)
Glucose-Capillary: 229 mg/dL — ABNORMAL HIGH (ref 70–99)

## 2018-05-16 LAB — CBC
HCT: 26.4 % — ABNORMAL LOW (ref 39.0–52.0)
Hemoglobin: 7.8 g/dL — ABNORMAL LOW (ref 13.0–17.0)
MCH: 27 pg (ref 26.0–34.0)
MCHC: 29.5 g/dL — AB (ref 30.0–36.0)
MCV: 91.3 fL (ref 80.0–100.0)
Platelets: 141 10*3/uL — ABNORMAL LOW (ref 150–400)
RBC: 2.89 MIL/uL — ABNORMAL LOW (ref 4.22–5.81)
RDW: 20.2 % — ABNORMAL HIGH (ref 11.5–15.5)
WBC: 8.8 10*3/uL (ref 4.0–10.5)
nRBC: 0 % (ref 0.0–0.2)

## 2018-05-16 SURGERY — CANCELLED PROCEDURE
Anesthesia: Monitor Anesthesia Care

## 2018-05-16 MED ORDER — INSULIN ASPART 100 UNIT/ML ~~LOC~~ SOLN
0.0000 [IU] | Freq: Three times a day (TID) | SUBCUTANEOUS | Status: DC
  Administered 2018-05-16 (×2): 3 [IU] via SUBCUTANEOUS
  Administered 2018-05-17: 1 [IU] via SUBCUTANEOUS
  Administered 2018-05-17 – 2018-05-18 (×3): 2 [IU] via SUBCUTANEOUS
  Administered 2018-05-18 – 2018-05-19 (×2): 3 [IU] via SUBCUTANEOUS
  Administered 2018-05-19: 2 [IU] via SUBCUTANEOUS
  Administered 2018-05-19: 1 [IU] via SUBCUTANEOUS
  Administered 2018-05-20: 3 [IU] via SUBCUTANEOUS
  Administered 2018-05-20: 2 [IU] via SUBCUTANEOUS
  Administered 2018-05-20 – 2018-05-21 (×2): 1 [IU] via SUBCUTANEOUS
  Administered 2018-05-21: 3 [IU] via SUBCUTANEOUS
  Administered 2018-05-21: 5 [IU] via SUBCUTANEOUS
  Administered 2018-05-22: 1 [IU] via SUBCUTANEOUS
  Administered 2018-05-22: 2 [IU] via SUBCUTANEOUS
  Administered 2018-05-22: 3 [IU] via SUBCUTANEOUS
  Administered 2018-05-23: 1 [IU] via SUBCUTANEOUS
  Administered 2018-05-23 – 2018-05-24 (×3): 3 [IU] via SUBCUTANEOUS
  Administered 2018-05-24 (×2): 2 [IU] via SUBCUTANEOUS
  Administered 2018-05-25: 3 [IU] via SUBCUTANEOUS
  Administered 2018-05-25: 1 [IU] via SUBCUTANEOUS
  Administered 2018-05-25 – 2018-05-26 (×2): 2 [IU] via SUBCUTANEOUS

## 2018-05-16 MED ORDER — ALBUTEROL SULFATE (2.5 MG/3ML) 0.083% IN NEBU
2.5000 mg | INHALATION_SOLUTION | RESPIRATORY_TRACT | Status: DC | PRN
  Administered 2018-05-16 – 2018-05-21 (×4): 2.5 mg via RESPIRATORY_TRACT
  Filled 2018-05-16 (×4): qty 3

## 2018-05-16 MED ORDER — MORPHINE SULFATE (PF) 2 MG/ML IV SOLN
1.0000 mg | INTRAVENOUS | Status: DC | PRN
  Administered 2018-05-16 – 2018-05-21 (×6): 1 mg via INTRAVENOUS
  Filled 2018-05-16 (×6): qty 1

## 2018-05-16 MED ORDER — FUROSEMIDE 10 MG/ML IJ SOLN
20.0000 mg | Freq: Once | INTRAMUSCULAR | Status: AC
  Administered 2018-05-16: 20 mg via INTRAVENOUS
  Filled 2018-05-16: qty 2

## 2018-05-16 SURGICAL SUPPLY — 14 items

## 2018-05-16 NOTE — Progress Notes (Signed)
  Speech Language Pathology Treatment: Dysphagia  Patient Details Name: Jose [name] MRN: [id number] DOB: 1930/12/25 Today's Date: 05/16/2018 Time: 2542-7062 SLP Time Calculation (min) (ACUTE ONLY): 27 min  Assessment / Plan / Recommendation Clinical Impression  Treatment today focused primarily on family education. They continue to report seemingly good tolerance of PO diet, which they describe as good amounts of intake and no coughing during intake. His wife wanted to review the menu for the mechanical soft diet at [facility] so that she can better make meal choices post-discharge. It was reiterated to her throughout session that this is not his current diet, which they want him to be consuming at this time. That being said, education was provided about textures that were most in line with mechanical soft and thickened liquids. Pt's daughter also asked about why pt has been having repeated episodes of respiratory distress overnight. I encouraged her to ask his MD about this, but did offer that if he is having increasing difficulty with thin liquids, that he could be having difficulty managing secretions. Encouraged them to maintain at least 30 degrees at the [facility]. SLP will continue to follow.   HPI HPI: Pt is an 85 yo male admitted with recurrent PNA (third admission since the end of January). CXR showed R>L basilar opacities. Pt had two prior MBS (2018, 2020) that both recommended thin liquids. Most recent MBS 04/29/18 with recommendation for regular/thin with small bolus sizes, self-administered if possible, with second swallow to clear residue. Meds whole in puree. He most recently discharged on Dys 3 diet and thin liquids by cup after BSE 05/03/18, but per MD note he was changed to nectar thick liquids at SNF. PMH: dementia, DM, CHF, PNA      SLP Plan  Continue with current plan of care       Recommendations  Diet recommendations: Dysphagia 1 (puree);Nectar-thick liquid Liquids provided  via: No straw;Cup Medication Administration: Whole meds with puree Supervision: Staff to assist with self feeding;Patient able to self feed;Full supervision/cueing for compensatory strategies Compensations: Minimize environmental distractions;Small sips/bites;Slow rate;Multiple dry swallows after each bite/sip Postural Changes and/or Swallow Maneuvers: Upright 30-60 min after meal;Seated upright 90 degrees                Oral Care Recommendations: Oral care BID Follow up Recommendations: Skilled Nursing facility SLP Visit Diagnosis: Dysphagia, oropharyngeal phase (R13.12) Plan: Continue with current plan of care       GO                Venita Sheffield Jakaylee Sasaki 05/16/2018, 11:26 AM  Nuala Alpha, M.A. [place] Acute Environmental education officer [phone] Office [phone]

## 2018-05-16 NOTE — Progress Notes (Addendum)
Daily Progress Note   Patient Name: Jose [name]       Date: 05/16/2018 DOB: January 04, 1931  Age: 85 y.o. MRN#: [id number] Attending Physician: Dessa Phi, DO Primary Care Physician: [name], MD Admit Date: 05/11/2018  Reason for Consultation/Follow-up: Establishing goals of care  Subjective: Patient resting in bed, about to receive albuterol treatment. Multiple family members at bedside.  Discussed patient's condition with wife. She states, " I feel like I'm in the doctors hands right now". We discussed a trial of a small dose of morphine PRN for dyspnea. We discussed how morphine can alleviate fear and decrease work of breathing. Patient's daughter is concerned about the addictive properties of morphine.We assured her that this is not a concern with her dad. They stated they will think about using the morphine.   We will check back tomorrow. Morphine PRN dyspnea added to orders.    Assessment: 85 year old male with advanced dementia, severe deconditioning, recurrent aspiration pneumonia, GIB, and respiratory distress.    Patient Profile/HPI: 85 y.o.maleadmitted on 2/9/2020with pastmedical history significant ofadvanced dementia, DM2, CHF, HTN. Patient was admitted for PNAon 1/25, recorded as aspiration PNAthough RVP came back positive for RSV.  Discharged 1/29 and readmitted one day later on 1/30 with recurrent acute respfailure with hypoxia suspected to be due to recurrent aspiration.  Continued physical, functional and cognitive decline over the past several months. Family face advanced directive decisions, advanced directive decisions and anticipatory care needs.   Length of Stay: 5  Current Medications: Scheduled Meds:  . buPROPion  100 mg Oral Daily  . docusate  sodium  100 mg Oral Daily  . donepezil  23 mg Oral Daily  . dorzolamide-timolol  1 drop Left Eye BID  . ferrous sulfate  325 mg Oral Q breakfast  . insulin aspart  0-9 Units Subcutaneous TID WC  . memantine  28 mg Oral Daily  . pantoprazole  40 mg Intravenous Q12H  . simvastatin  5 mg Oral q1800    Continuous Infusions: . ampicillin-sulbactam (UNASYN) IV 3 g (05/16/18 1216)    PRN Meds: albuterol, naphazoline-glycerin, polyvinyl alcohol, RESOURCE THICKENUP CLEAR  Physical Exam      General: elderly, kyphotic male, resting in bed, doesn't speak with me today.  Pupils pinpoint. Respiratory: normal rate, normal effort Cardiac: regular rate  Vital Signs: BP 126/65 (BP Location: Left Arm)   Pulse 86   Temp 98 F (36.7 C)   Resp (!) 24   Ht 6\' 1"  (1.854 m)   Wt 77 kg   SpO2 95%   BMI 22.40 kg/m  SpO2: SpO2: 95 % O2 Device: O2 Device: Nasal Cannula O2 Flow Rate: O2 Flow Rate (L/min): 3 L/min  Intake/output summary:   Intake/Output Summary (Last 24 hours) at 05/16/2018 1314 Last data filed at 05/16/2018 0800 Gross per 24 hour  Intake 1299.58 ml  Output 850 ml  Net 449.58 ml   LBM: Last BM Date: 05/15/18 Baseline Weight: Weight: 77 kg Most recent weight: Weight: 77 kg       Palliative Assessment/Data: 20%    Flowsheet Rows     Most Recent Value  Intake Tab  Referral Department  Hospitalist  Unit at Time of Referral  ER  Palliative Care Primary Diagnosis  Sepsis/Infectious Disease  Date Notified  05/12/18  Palliative Care Type  Return patient Palliative Care  Reason for referral  Clarify Goals of Care  Date of Admission  05/11/18  Date first seen by Palliative Care  05/12/18  # of days Palliative referral response time  0 Day(s)  # of days IP prior to Palliative referral  1  Clinical Assessment  Psychosocial & Spiritual Assessment  Palliative Care Outcomes      Patient Active Problem List   Diagnosis Date Noted  . Recurrent pneumonia   . Goals of care,  counseling/discussion   . Eye irritation 05/11/2018  . DNR (do not resuscitate) discussion   . Palliative care by specialist   . CHF (congestive heart failure) ([place]) 05/01/2018  . Acute respiratory failure ([place]) 05/01/2018  . Aspiration pneumonia ([place]) 04/26/2018  . Constipation 04/15/2018  . HTN (hypertension) 04/15/2018  . Iron deficiency anemia 04/08/2018  . Actinic keratosis 06/21/2017  . Moderate protein-calorie malnutrition ([place]) 03/06/2017  . Unsteady gait 03/06/2017  . Urinary incontinence 12/31/2016  . Dysphagia 12/31/2016  . Dermatitis, seborrheic 12/14/2016  . Edema 12/14/2016  . Hyperlipidemia associated with type 2 diabetes mellitus ([place]) 12/06/2016  . Functional urinary incontinence 12/06/2016  . Chronic bilateral low back pain with bilateral sciatica 12/06/2016  . Chronic combined systolic and diastolic congestive heart failure ([place]) 12/06/2016  . Major depression, recurrent, chronic ([place]) 11/23/2016  . OAB (overactive bladder) 11/23/2016  . Dermatitis 08/23/2016  . External hemorrhoid 07/26/2016  . Depression with anxiety 07/09/2016  . Thrombocytopenia ([place]) 06/28/2016  . Closed left hip fracture, sequela 06/22/2016  . Type 2 diabetes mellitus with diabetic chronic kidney disease ([place]) 06/22/2016  . Hyperlipidemia LDL goal <70 06/22/2016  . Alzheimer disease ([place]) 11/12/2012  . History of bilateral hip replacements 11/12/2012    Palliative Care Plan    Recommendations/Plan:  Add 1 mg morphine PRN for dyspnea.  Hopefully this will help obviate the need for Bipap.  Family's goal is for [name] to return to [facility].  Will continue to follow up with family for ongoing discussions on trajectory of illnesses and discharge planning.  Goals of Care and Additional Recommendations:  Limitations on Scope of Treatment: Full Scope Treatment  Code Status:  DNR, with BiPAP PRN  Prognosis:  < 3 months secondary to recurrent asp  pneumonia.  Discharge Planning:  To Be Determined, Family hopeful he will return to [place] was discussed with patient's family, RN Rush Landmark)  Thank you for allowing  the Palliative Medicine Team to assist in the care of this patient.  Total time spent:  25 minutes     Greater than 50%  of this time was spent counseling and coordinating care related to the above assessment and plan.  Ferrel Logan, PA-S 05/16/2018   Florentina Jenny, PA-C Palliative Medicine  Please contact Palliative MedicineTeam phone at [phone] for questions and concerns between 7 am - 7 pm.   Please see AMION for individual provider pager numbers.

## 2018-05-16 NOTE — Progress Notes (Signed)
PROGRESS NOTE    BION TODOROV  MAY:[id number] DOB: 1930-08-27 DOA: 05/11/2018 PCP: [name], MD     Brief Narrative:  Jose [name] is an 85 y.o. male with a history of advanced dementia, T2DM, chronic combined CHF, HTN, and aspiration pneumonia who presented to the ED from SNF on 2/9 with shortness of breath after breakfast, possibly due to recurrent aspiration pneumonia. He had been hospitalized for RSV, pneumonia, and UTI treatment, discharged 1/29, returned to the ED 3 hours later with suspected recurrent aspiration. Ultimately treated and discharged on 2/3 on dysphagia diet. During this hospitalization, while undergoing treatment for pneumonia, he was found to have an acute drop in hemoglobin to 5.5. GI was consulted, recommended EGD. 2u PRBCs transfused with lasix with appropriate rise in hgb to 8.5. Unfortunately he developed respiratory distress due to pulmonary edema requiring BiPAP on 2/12. Due to tenuous respiratory status and high risk of intubation, EGD was no longer pursued. PCCM also consulted as well as Palliative care medicine.   New events last 24 hours / Subjective: Required BiPAP overnight, off this morning.  He was evaluated by SLP, recommended for dysphagia 1 diet. Wife, granddaughter, caregiver at bedside.  Assessment & Plan:   Principal Problem:   Aspiration pneumonia ([place]) Active Problems:   Alzheimer disease ([place])   Type 2 diabetes mellitus with diabetic chronic kidney disease ([place])   Chronic combined systolic and diastolic congestive heart failure (HCC)   Dysphagia   HTN (hypertension)   Acute respiratory failure (HCC)   DNR (do not resuscitate) discussion   Eye irritation   Recurrent pneumonia   Goals of care, counseling/discussion   Aspiration pneumonia, dysphagia -Continue unasyn -SLP recommending dysphagia 1 diet  Symptomatic acute blood loss anemia, suspected upper GI bleeding -Discussed with GI, no current plans for EGD due to patient's high  risk status -Continue IV PPI -Trend Hgb   Acute hypoxic respiratory failure: Due to pneumonia, recurrent aspiration, and acute pulmonary edema due to volume overload from transfusions.  -Continue supplemental oxygen prn.  -Stay in progressive care unit for prn BiPAP.  -Goals of care discussions ongoing, appreciate both CCM and palliative care assistance.  Acute on chronic combined HFrEF -Has been receiving IV Lasix as needed  T2DM -Sliding scale insulin  Dementia  -Delirium precautions -Continue Aricept, namenda  Depression -Continue Wellbutrin   DVT prophylaxis: SCD Code Status: DNR, changed after conversation by PCCM Family Communication: Wife, granddaughter, caregiver at bedside Disposition Plan: Uncertain, guarded prognosis, need to improve respiratory status prior to discharge   Consultants:   PCCM  GI  Palliative care  Procedures:   None  Antimicrobials:  Anti-infectives (From admission, onward)   Start     Dose/Rate Route Frequency Ordered Stop   05/12/18 0300  Ampicillin-Sulbactam (UNASYN) 3 g in sodium chloride 0.9 % 100 mL IVPB     3 g 200 mL/hr over 30 Minutes Intravenous Every 6 hours 05/11/18 2015     05/11/18 1930  Ampicillin-Sulbactam (UNASYN) 3 g in sodium chloride 0.9 % 100 mL IVPB     3 g 200 mL/hr over 30 Minutes Intravenous  Once 05/11/18 1926 05/11/18 2125       Objective: Vitals:   05/16/18 0512 05/16/18 0741 05/16/18 0756 05/16/18 0805  BP: 121/74  126/65   Pulse: 77 76 78   Resp: 18 (!) 26    Temp: 98.2 F (36.8 C)  98 F (36.7 C)   TempSrc: Oral     SpO2: 97%  100% 100% 100%  Weight:      Height:        Intake/Output Summary (Last 24 hours) at 05/16/2018 1151 Last data filed at 05/16/2018 0800 Gross per 24 hour  Intake 1599.58 ml  Output 850 ml  Net 749.58 ml   Filed Weights   05/12/18 2100  Weight: 77 kg    Examination:  General exam: Appears calm and comfortable  Respiratory system: Diminished breath  sounds Cardiovascular system: S1 & S2 heard, RRR. No JVD, murmurs, rubs, gallops or clicks. No pedal edema. Gastrointestinal system: Abdomen is nondistended, soft and nontender. No organomegaly or masses felt.  Central nervous system: Alert Extremities: Symmetric Psychiatry: Dementia  Data Reviewed: I have personally reviewed following labs and imaging studies  CBC: Recent Labs  Lab 05/11/18 1626 05/13/18 0251 05/13/18 0505  05/14/18 0618 05/14/18 1351 05/14/18 2042 05/15/18 0734 05/16/18 0315  WBC 15.6* 7.8 8.2  --   --   --  8.1 10.2 8.8  NEUTROABS 13.2* 5.1 5.7  --   --   --   --   --   --   HGB 7.6* 5.5* 5.5*   < > 9.0* 8.5* 8.3* 7.9* 7.8*  HCT 26.0* 19.5* 19.1*   < > 29.4* 27.7* 27.2* 26.9* 26.4*  MCV 91.2 92.9 92.7  --   --   --  89.5 91.2 91.3  PLT 182 130* 126*  --   --   --  142* 149* 141*   < > = values in this interval not displayed.   Basic Metabolic Panel: Recent Labs  Lab 05/11/18 1626 05/13/18 0251 05/14/18 1351 05/15/18 0734 05/16/18 0315  NA 136 142 143 146* 147*  K 4.8 4.1 3.6 3.8 3.7  CL 104 115* 111 112* 115*  CO2 21* 21* 23 25 24   GLUCOSE 256* 104* 189* 141* 127*  BUN 25* 18 17 16 16   CREATININE 1.15 1.00 1.24 1.13 0.99  CALCIUM 8.3* 7.8* 8.1* 8.0* 7.9*  MG  --  2.1  --   --   --   PHOS  --  3.1  --   --   --    GFR: Estimated Creatinine Clearance: 59.4 mL/min (by C-G formula based on SCr of 0.99 mg/dL). Liver Function Tests: Recent Labs  Lab 05/11/18 1626 05/13/18 0251  AST 37  --   ALT 27  --   ALKPHOS 86  --   BILITOT 0.5  --   PROT 5.4*  --   ALBUMIN 2.5* 2.0*   No results for input(s): LIPASE, AMYLASE in the last 168 hours. No results for input(s): AMMONIA in the last 168 hours. Coagulation Profile: No results for input(s): INR, PROTIME in the last 168 hours. Cardiac Enzymes: Recent Labs  Lab 05/11/18 1626  TROPONINI <0.03   BNP (last 3 results) No results for input(s): PROBNP in the last 8760 hours. HbA1C: No results  for input(s): HGBA1C in the last 72 hours. CBG: Recent Labs  Lab 05/15/18 1729 05/15/18 2001 05/15/18 2356 05/16/18 0409 05/16/18 0758  GLUCAP 190* 201* 133* 128* 118*   Lipid Profile: No results for input(s): CHOL, HDL, LDLCALC, TRIG, CHOLHDL, LDLDIRECT in the last 72 hours. Thyroid Function Tests: No results for input(s): TSH, T4TOTAL, FREET4, T3FREE, THYROIDAB in the last 72 hours. Anemia Panel: No results for input(s): VITAMINB12, FOLATE, FERRITIN, TIBC, IRON, RETICCTPCT in the last 72 hours. Sepsis Labs: Recent Labs  Lab 05/12/18 0943  PROCALCITON 0.15    Recent Results (from the  past 240 hour(s))  Culture, blood (routine x 2) Call MD if unable to obtain prior to antibiotics being given     Status: None (Preliminary result)   Collection Time: 05/11/18  8:30 PM  Result Value Ref Range Status   Specimen Description BLOOD LEFT ARM  Final   Special Requests   Final    BOTTLES DRAWN AEROBIC AND ANAEROBIC Blood Culture results may not be optimal due to an excessive volume of blood received in culture bottles   Culture   Final    NO GROWTH 4 DAYS Performed at [facility] [address] [place], [place] [address]    Report Status PENDING  Incomplete  Culture, blood (routine x 2) Call MD if unable to obtain prior to antibiotics being given     Status: None (Preliminary result)   Collection Time: 05/11/18  8:50 PM  Result Value Ref Range Status   Specimen Description BLOOD LEFT HAND  Final   Special Requests   Final    BOTTLES DRAWN AEROBIC ONLY Blood Culture results may not be optimal due to an excessive volume of blood received in culture bottles   Culture   Final    NO GROWTH 4 DAYS Performed at [facility], [place] [address] [place], [place] [address]    Report Status PENDING  Incomplete       Radiology Studies: Dg Chest Port 1 View  Result Date: 05/15/2018 CLINICAL DATA:  Initial evaluation for acute increase in shortness of breath. EXAM: PORTABLE  CHEST 1 VIEW COMPARISON:  Prior radiograph from earlier the same day. FINDINGS: Stable cardiomegaly.  Mediastinal silhouette within normal limits. Lungs hypoinflated with elevation of right hemidiaphragm, similar. Dense retrocardiac left lower opacity most compatible with atelectasis. Increased confluent opacity at the inferior right upper lobe, which could reflect atelectasis and/or worsening/new infiltrate. Mildly increased right basilar atelectasis. Underlying diffuse pulmonary interstitial edema with scattered Kerley B-lines, slightly worsened. Small bilateral pleural effusions. No pneumothorax. Osseous structures unchanged. IMPRESSION: 1. Slight interval worsening in mild diffuse pulmonary interstitial edema. Small bilateral pleural effusions. 2. Increased confluent right perihilar opacity, which could reflect progressive atelectasis and/or infiltrate. 3. Persistent bibasilar atelectasis. Electronically Signed   By: Jeannine Boga M.D.   On: 05/15/2018 23:40   Dg Chest Port 1 View  Result Date: 05/15/2018 CLINICAL DATA:  Acute respiratory failure EXAM: PORTABLE CHEST 1 VIEW COMPARISON:  05/14/2018 FINDINGS: Cardiomegaly. Improving bilateral opacities. Continued mild residual right perihilar opacities and bibasilar atelectasis. Suspect small layering effusions. No acute bony abnormality. IMPRESSION: Improving bilateral airspace disease. Continued right perihilar opacities and bibasilar atelectasis. Question small effusions. Electronically Signed   By: Rolm Baptise M.D.   On: 05/15/2018 03:32      Scheduled Meds: . buPROPion  100 mg Oral Daily  . docusate sodium  100 mg Oral Daily  . donepezil  23 mg Oral Daily  . dorzolamide-timolol  1 drop Left Eye BID  . ferrous sulfate  325 mg Oral Q breakfast  . insulin aspart  0-9 Units Subcutaneous TID WC  . memantine  28 mg Oral Daily  . pantoprazole  40 mg Intravenous Q12H  . simvastatin  5 mg Oral q1800   Continuous Infusions: .  ampicillin-sulbactam (UNASYN) IV 3 g (05/16/18 0537)     LOS: 5 days    Time spent: 45 minutes   Dessa Phi, DO [facility] [email or web address] 05/16/2018, 11:51 AM

## 2018-05-16 NOTE — Progress Notes (Signed)
Progress Note   Subjective  Patient had one BM yesterday, reportedly had some dark coloration per nursing but not melena. He again went into respiratory distress in early AM prompting use of BiPAP while stabilized him.   Objective   Vital signs in last 24 hours: Temp:  [98.2 F (36.8 C)-98.6 F (37 C)] 98.2 F (36.8 C) (02/14 0512) Pulse Rate:  [74-90] 77 (02/14 0512) Resp:  [18-40] 18 (02/14 0512) BP: (97-145)/(69-86) 121/74 (02/14 0512) SpO2:  [96 %-100 %] 97 % (02/14 0512) FiO2 (%):  [50 %] 50 % (02/14 0000) Last BM Date: 05/15/18 General:    white male in NAD on BiPAP Heart:  Regular rate and rhythm Lungs: Respirations even and unlabored, Abdomen:  Soft, nontender and nondistended. Extremities:  Without edema. Psych:  Cooperative. Normal mood and affect.  Intake/Output from previous day: 02/13 0701 - 02/14 0700 In: 1941.6 [P.O.:900; I.V.:627.3; IV Piggyback:414.3] Out: 1050 [Urine:1050] Intake/Output this shift: No intake/output data recorded.  Lab Results: Recent Labs    05/14/18 2042 05/15/18 0734 05/16/18 0315  WBC 8.1 10.2 8.8  HGB 8.3* 7.9* 7.8*  HCT 27.2* 26.9* 26.4*  PLT 142* 149* 141*   BMET Recent Labs    05/14/18 1351 05/15/18 0734 05/16/18 0315  NA 143 146* 147*  K 3.6 3.8 3.7  CL 111 112* 115*  CO2 23 25 24   GLUCOSE 189* 141* 127*  BUN 17 16 16   CREATININE 1.24 1.13 0.99  CALCIUM 8.1* 8.0* 7.9*   LFT No results for input(s): PROT, ALBUMIN, AST, ALT, ALKPHOS, BILITOT, BILIDIR, IBILI in the last 72 hours. PT/INR No results for input(s): LABPROT, INR in the last 72 hours.  Studies/Results: Dg Chest Port 1 View  Result Date: 05/15/2018 CLINICAL DATA:  Initial evaluation for acute increase in shortness of breath. EXAM: PORTABLE CHEST 1 VIEW COMPARISON:  Prior radiograph from earlier the same day. FINDINGS: Stable cardiomegaly.  Mediastinal silhouette within normal limits. Lungs hypoinflated with elevation of right hemidiaphragm,  similar. Dense retrocardiac left lower opacity most compatible with atelectasis. Increased confluent opacity at the inferior right upper lobe, which could reflect atelectasis and/or worsening/new infiltrate. Mildly increased right basilar atelectasis. Underlying diffuse pulmonary interstitial edema with scattered Kerley B-lines, slightly worsened. Small bilateral pleural effusions. No pneumothorax. Osseous structures unchanged. IMPRESSION: 1. Slight interval worsening in mild diffuse pulmonary interstitial edema. Small bilateral pleural effusions. 2. Increased confluent right perihilar opacity, which could reflect progressive atelectasis and/or infiltrate. 3. Persistent bibasilar atelectasis. Electronically Signed   By: Jeannine Boga M.D.   On: 05/15/2018 23:40   Dg Chest Port 1 View  Result Date: 05/15/2018 CLINICAL DATA:  Acute respiratory failure EXAM: PORTABLE CHEST 1 VIEW COMPARISON:  05/14/2018 FINDINGS: Cardiomegaly. Improving bilateral opacities. Continued mild residual right perihilar opacities and bibasilar atelectasis. Suspect small layering effusions. No acute bony abnormality. IMPRESSION: Improving bilateral airspace disease. Continued right perihilar opacities and bibasilar atelectasis. Question small effusions. Electronically Signed   By: Rolm Baptise M.D.   On: 05/15/2018 03:32       Assessment / Plan:    85 y/o male with dementia, PVD, CHF, history of pneumonia in recent months,whopresentedwith worsening anemia over the past few months, heme positive darkstools, Hgb of 5sinitially. I have discussed options with the family whether or not to do an endoscopy at some point, initially they wanted to proceed. Unfortunately each of the past 3 days he has had respiratory distress leading to BiPAP and again on BiPAP this AM. I'm  concerned his respiratory status will not tolerate sedation with high chance for intubation. I discussed with his care giver this morning and will discuss with  the hospitalist service to discuss long term plan, but given his respiratory status, risks may outweigh benefits of endoscopy at this time.   His Hgb is relatively stable, slow drift in the setting of daily blood draws. I would continue PPI for now  Hemphill Cellar, MD [facility]

## 2018-05-17 ENCOUNTER — Inpatient Hospital Stay (HOSPITAL_COMMUNITY): Payer: Medicare Other

## 2018-05-17 DIAGNOSIS — K922 Gastrointestinal hemorrhage, unspecified: Secondary | ICD-10-CM

## 2018-05-17 DIAGNOSIS — J9610 Chronic respiratory failure, unspecified whether with hypoxia or hypercapnia: Secondary | ICD-10-CM

## 2018-05-17 DIAGNOSIS — J9600 Acute respiratory failure, unspecified whether with hypoxia or hypercapnia: Secondary | ICD-10-CM

## 2018-05-17 LAB — CULTURE, BLOOD (ROUTINE X 2)
CULTURE: NO GROWTH
Culture: NO GROWTH

## 2018-05-17 LAB — CBC
HCT: 27.4 % — ABNORMAL LOW (ref 39.0–52.0)
Hemoglobin: 8.4 g/dL — ABNORMAL LOW (ref 13.0–17.0)
MCH: 28.3 pg (ref 26.0–34.0)
MCHC: 30.7 g/dL (ref 30.0–36.0)
MCV: 92.3 fL (ref 80.0–100.0)
NRBC: 0 % (ref 0.0–0.2)
Platelets: 150 10*3/uL (ref 150–400)
RBC: 2.97 MIL/uL — ABNORMAL LOW (ref 4.22–5.81)
RDW: 20.3 % — AB (ref 11.5–15.5)
WBC: 9.6 10*3/uL (ref 4.0–10.5)

## 2018-05-17 LAB — BASIC METABOLIC PANEL
ANION GAP: 9 (ref 5–15)
BUN: 16 mg/dL (ref 8–23)
CALCIUM: 8.1 mg/dL — AB (ref 8.9–10.3)
CO2: 26 mmol/L (ref 22–32)
Chloride: 113 mmol/L — ABNORMAL HIGH (ref 98–111)
Creatinine, Ser: 1.06 mg/dL (ref 0.61–1.24)
GFR calc Af Amer: >60 mL/min (ref >60)
Glucose, Bld: 137 mg/dL — ABNORMAL HIGH (ref 70–99)
Potassium: 3.5 mmol/L (ref 3.5–5.1)
Sodium: 148 mmol/L — ABNORMAL HIGH (ref 135–145)

## 2018-05-17 LAB — GLUCOSE, CAPILLARY
GLUCOSE-CAPILLARY: 145 mg/dL — AB (ref 70–99)
GLUCOSE-CAPILLARY: 154 mg/dL — AB (ref 70–99)
Glucose-Capillary: 133 mg/dL — ABNORMAL HIGH (ref 70–99)
Glucose-Capillary: 146 mg/dL — ABNORMAL HIGH (ref 70–99)
Glucose-Capillary: 147 mg/dL — ABNORMAL HIGH (ref 70–99)
Glucose-Capillary: 184 mg/dL — ABNORMAL HIGH (ref 70–99)
Glucose-Capillary: 264 mg/dL — ABNORMAL HIGH (ref 70–99)

## 2018-05-17 MED ORDER — FUROSEMIDE 10 MG/ML IJ SOLN
20.0000 mg | Freq: Once | INTRAMUSCULAR | Status: AC
  Administered 2018-05-17: 20 mg via INTRAVENOUS
  Filled 2018-05-17: qty 2

## 2018-05-17 MED ORDER — LORAZEPAM 2 MG/ML IJ SOLN: 1.0000 mg | Freq: Four times a day (QID) | INTRAMUSCULAR | Status: DC | PRN

## 2018-05-17 NOTE — Progress Notes (Signed)
Noted patient in expiratory wheezes and using all his accessory muscles to breath. Albuterol via neb administered. Became hypoxic on high flow.  Patient placed back on BIPAP. 02 sat now 99% on BIPAP. Vital signs stable right now. Will continue to monitor.

## 2018-05-17 NOTE — Progress Notes (Signed)
Progress Note   Subjective  Patient requiring BiPAP intermittently for respiratory distress. Had a brown BM overnight. Hgb rising.    Objective   Vital signs in last 24 hours: Temp:  [97.6 F (36.4 C)-98.7 F (37.1 C)] 98.2 F (36.8 C) (02/15 0753) Pulse Rate:  [86-114] 95 (02/15 0753) Resp:  [18-47] 24 (02/15 0600) BP: (142-158)/(66-97) 158/92 (02/15 0753) SpO2:  [73 %-100 %] 100 % (02/15 0753) Last BM Date: 05/16/18 General:    white male in NAD, resting in bed Heart:  Regular rate and rhythm;  Lungs: Respirations even with some laboring, Abdomen:  Soft, nontender and nondistended.  Extremities:  Without edema. Psych:  Cooperative. Normal mood and affect.  Intake/Output from previous day: 02/14 0701 - 02/15 0700 In: 150 [I.V.:50; IV Piggyback:100] Out: 1400 [Urine:1400] Intake/Output this shift: Total I/O In: -  Out: 500 [Urine:500]  Lab Results: Recent Labs    05/15/18 0734 05/16/18 0315 05/17/18 0249  WBC 10.2 8.8 9.6  HGB 7.9* 7.8* 8.4*  HCT 26.9* 26.4* 27.4*  PLT 149* 141* 150   BMET Recent Labs    05/15/18 0734 05/16/18 0315 05/17/18 0249  NA 146* 147* 148*  K 3.8 3.7 3.5  CL 112* 115* 113*  CO2 25 24 26   GLUCOSE 141* 127* 137*  BUN 16 16 16   CREATININE 1.13 0.99 1.06  CALCIUM 8.0* 7.9* 8.1*   LFT No results for input(s): PROT, ALBUMIN, AST, ALT, ALKPHOS, BILITOT, BILIDIR, IBILI in the last 72 hours. PT/INR No results for input(s): LABPROT, INR in the last 72 hours.  Studies/Results: Dg Chest Port 1 View  Result Date: 05/17/2018 CLINICAL DATA:  Acute hypoxemic respiratory failure. History of congestive heart failure and diabetes. EXAM: PORTABLE CHEST 1 VIEW COMPARISON:  Radiographs 05/15/2018 and 05/14/2018. FINDINGS: 0801 hours. Two views submitted. The heart size and mediastinal contours are stable. There are fluctuating bilateral airspace opacities with current components in both lower lobes and the right upper lobe, similar to the  most recent study. Pulmonary vascularity appears normal. There are probable small bilateral pleural effusions. No pneumothorax or acute osseous findings. IMPRESSION: Unchanged bilateral airspace opacities compared with most recent study of 2 days ago. These findings likely represent a combination of atelectasis and inflammation. No definite residual edema. Electronically Signed   By: Richardean Sale M.D.   On: 05/17/2018 10:08   Dg Chest Port 1 View  Result Date: 05/15/2018 CLINICAL DATA:  Initial evaluation for acute increase in shortness of breath. EXAM: PORTABLE CHEST 1 VIEW COMPARISON:  Prior radiograph from earlier the same day. FINDINGS: Stable cardiomegaly.  Mediastinal silhouette within normal limits. Lungs hypoinflated with elevation of right hemidiaphragm, similar. Dense retrocardiac left lower opacity most compatible with atelectasis. Increased confluent opacity at the inferior right upper lobe, which could reflect atelectasis and/or worsening/new infiltrate. Mildly increased right basilar atelectasis. Underlying diffuse pulmonary interstitial edema with scattered Kerley B-lines, slightly worsened. Small bilateral pleural effusions. No pneumothorax. Osseous structures unchanged. IMPRESSION: 1. Slight interval worsening in mild diffuse pulmonary interstitial edema. Small bilateral pleural effusions. 2. Increased confluent right perihilar opacity, which could reflect progressive atelectasis and/or infiltrate. 3. Persistent bibasilar atelectasis. Electronically Signed   By: Jeannine Boga M.D.   On: 05/15/2018 23:40       Assessment / Plan:    85 y/o male with dementia, PVD, CHF, history of pneumonia in recent months,whopresentedwith worsening anemia over the past few months, heme positive darkstools, Hgb of 5sinitially, responded to RBC transfusions. I  have discussed options with the family whether or not to do an endoscopy at some point, initially they wanted to proceed. Unfortunately  his respiratory status is too labile and requiring BiPAP frequently. Fortunately with PPI his stools have turned brown and Hgb stable. I spoke with the patient's wife today. I think the risk of anesthesia and endoscopy outweighs the benefits. He is high risk for needing intubation for endoscopy, and would be difficult to get off the vent. Family and care giver agreed, will hold off on any invasive procedures at this time. I would continue IV PPI while hospitalized BID and transition to oral PPI BID upon discharge.  We will sign off for now, call with questions.   [place] Cellar, MD [facility]

## 2018-05-17 NOTE — Progress Notes (Signed)
PROGRESS NOTE    Jose [name]  YIR:[id number] DOB: 1930-08-08 DOA: 05/11/2018 PCP: Jose Dad, MD     Brief Narrative:  Jose [name] is an 85 y.o. male with a history of advanced dementia, T2DM, chronic combined CHF, HTN, and aspiration pneumonia who presented to the ED from SNF on 2/9 with shortness of breath after breakfast, possibly due to recurrent aspiration pneumonia. He had been hospitalized for RSV, pneumonia, and UTI treatment, discharged 1/29, returned to the ED 3 hours later with suspected recurrent aspiration. Ultimately treated and discharged on 2/3 on dysphagia diet. During this hospitalization, while undergoing treatment for pneumonia, he was found to have an acute drop in hemoglobin to 5.5. GI was consulted, recommended EGD. 2u PRBCs transfused with lasix with appropriate rise in hgb to 8.5. Unfortunately he developed respiratory distress due to pulmonary edema requiring BiPAP on 2/12. Due to tenuous respiratory status and high risk of intubation, EGD was no longer pursued. PCCM also consulted as well as Palliative care medicine.   New events last 24 hours / Subjective: Required IV Lasix last night, BiPAP overnight and again this morning.  Patient arousable to voice, no acute complaints.  Caregiver at bedside.  Assessment & Plan:   Principal Problem:   Aspiration pneumonia (Jose [name]) Active Problems:   Alzheimer disease ([place])   Type 2 diabetes mellitus with diabetic chronic kidney disease ([place])   Chronic combined systolic and diastolic congestive heart failure (HCC)   Dysphagia   HTN (hypertension)   Acute respiratory failure ([place])   DNR (do not resuscitate) discussion   Eye irritation   Recurrent pneumonia   Goals of care, counseling/discussion   Palliative care encounter   Aspiration pneumonia, dysphagia -Continue unasyn -SLP recommending dysphagia 1 diet.  NPO while on BiPAP  Symptomatic acute blood loss anemia, suspected upper GI bleeding -Discussed with GI, no  current plans for EGD due to patient's high risk status -Continue IV PPI BID  -Trend Hgb, stable this morning 8.4  Acute hypoxic respiratory failure: Due to pneumonia, recurrent aspiration, and acute pulmonary edema due to volume overload from transfusions.  -Continue supplemental oxygen prn.  -Stay in progressive care unit for prn BiPAP.  -Goals of care discussions ongoing, appreciate both CCM and palliative care assistance -IV morphine as needed for dyspnea, air hunger ordered by palliative care  Acute on chronic combined HFrEF -Repeat chest x-ray reviewed independently, diffuse interstitial edema has improved in appearance from x-ray 2/13 -We will order for another dose of IV Lasix this afternoon  T2DM -Sliding scale insulin  Dementia  -Delirium precautions -Continue Aricept, namenda  Depression -Continue Wellbutrin  Hypernatremia -No IV treatments due to patient's recent history of pulmonary edema, encourage p.o. intake as able when off BiPAP   DVT prophylaxis: SCD Code Status: DNR, changed after conversation by PCCM Family Communication: Caregiver at bedside Disposition Plan: Uncertain, guarded prognosis, need to improve respiratory status prior to discharge.  Family still hopeful for discharge to [facility]  Consultants:   PCCM  GI  Palliative care  Procedures:   None  Antimicrobials:  Anti-infectives (From admission, onward)   Start     Dose/Rate Route Frequency Ordered Stop   05/12/18 0300  Ampicillin-Sulbactam (UNASYN) 3 g in sodium chloride 0.9 % 100 mL IVPB     3 g 200 mL/hr over 30 Minutes Intravenous Every 6 hours 05/11/18 2015     05/11/18 1930  Ampicillin-Sulbactam (UNASYN) 3 g in sodium chloride 0.9 % 100 mL IVPB  3 g 200 mL/hr over 30 Minutes Intravenous  Once 05/11/18 1926 05/11/18 2125       Objective: Vitals:   05/16/18 2225 05/16/18 2228 05/17/18 0600 05/17/18 0753  BP:  (!) 151/97  (!) 158/92  Pulse: (!) 112 (!) 114  95    Resp: (!) 47 (!) 40 (!) 24   Temp:  97.6 F (36.4 C)  98.2 F (36.8 C)  TempSrc:  Axillary  Oral  SpO2: 100% 98% 100% 100%  Weight:      Height:        Intake/Output Summary (Last 24 hours) at 05/17/2018 1053 Last data filed at 05/17/2018 0900 Gross per 24 hour  Intake 100 ml  Output 1900 ml  Net -1800 ml   Filed Weights   05/12/18 2100  Weight: 77 kg    Examination: General exam: Appears calm and comfortable  Respiratory system: Clear to auscultation anteriorly, on BiPAP Cardiovascular system: S1 & S2 heard, RRR. No JVD, murmurs, rubs, gallops or clicks. No pedal edema. Gastrointestinal system: Abdomen is nondistended, soft and nontender. No organomegaly or masses felt. Normal bowel sounds heard. Central nervous system: Alert to voice Extremities: Symmetric Skin: No rashes, lesions or ulcers on exposed skin Psychiatry: Dementia   Data Reviewed: I have personally reviewed following labs and imaging studies  CBC: Recent Labs  Lab 05/11/18 1626 05/13/18 0251 05/13/18 0505  05/14/18 1351 05/14/18 2042 05/15/18 0734 05/16/18 0315 05/17/18 0249  WBC 15.6* 7.8 8.2  --   --  8.1 10.2 8.8 9.6  NEUTROABS 13.2* 5.1 5.7  --   --   --   --   --   --   HGB 7.6* 5.5* 5.5*   < > 8.5* 8.3* 7.9* 7.8* 8.4*  HCT 26.0* 19.5* 19.1*   < > 27.7* 27.2* 26.9* 26.4* 27.4*  MCV 91.2 92.9 92.7  --   --  89.5 91.2 91.3 92.3  PLT 182 130* 126*  --   --  142* 149* 141* 150   < > = values in this interval not displayed.   Basic Metabolic Panel: Recent Labs  Lab 05/13/18 0251 05/14/18 1351 05/15/18 0734 05/16/18 0315 05/17/18 0249  NA 142 143 146* 147* 148*  K 4.1 3.6 3.8 3.7 3.5  CL 115* 111 112* 115* 113*  CO2 21* 23 25 24 26   GLUCOSE 104* 189* 141* 127* 137*  BUN 18 17 16 16 16   CREATININE 1.00 1.24 1.13 0.99 1.06  CALCIUM 7.8* 8.1* 8.0* 7.9* 8.1*  MG 2.1  --   --   --   --   PHOS 3.1  --   --   --   --    GFR: Estimated Creatinine Clearance: 55.5 mL/min (by C-G formula  based on SCr of 1.06 mg/dL). Liver Function Tests: Recent Labs  Lab 05/11/18 1626 05/13/18 0251  AST 37  --   ALT 27  --   ALKPHOS 86  --   BILITOT 0.5  --   PROT 5.4*  --   ALBUMIN 2.5* 2.0*   No results for input(s): LIPASE, AMYLASE in the last 168 hours. No results for input(s): AMMONIA in the last 168 hours. Coagulation Profile: No results for input(s): INR, PROTIME in the last 168 hours. Cardiac Enzymes: Recent Labs  Lab 05/11/18 1626  TROPONINI <0.03   BNP (last 3 results) No results for input(s): PROBNP in the last 8760 hours. HbA1C: No results for input(s): HGBA1C in the last 72 hours. CBG: Recent Labs  Lab 05/16/18 1700 05/16/18 1939 05/17/18 0008 05/17/18 0358 05/17/18 0751  GLUCAP 231* 172* 146* 133* 145*   Lipid Profile: No results for input(s): CHOL, HDL, LDLCALC, TRIG, CHOLHDL, LDLDIRECT in the last 72 hours. Thyroid Function Tests: No results for input(s): TSH, T4TOTAL, FREET4, T3FREE, THYROIDAB in the last 72 hours. Anemia Panel: No results for input(s): VITAMINB12, FOLATE, FERRITIN, TIBC, IRON, RETICCTPCT in the last 72 hours. Sepsis Labs: Recent Labs  Lab 05/12/18 0943  PROCALCITON 0.15    Recent Results (from the past 240 hour(s))  Culture, blood (routine x 2) Call MD if unable to obtain prior to antibiotics being given     Status: None   Collection Time: 05/11/18  8:30 PM  Result Value Ref Range Status   Specimen Description BLOOD LEFT ARM  Final   Special Requests   Final    BOTTLES DRAWN AEROBIC AND ANAEROBIC Blood Culture results may not be optimal due to an excessive volume of blood received in culture bottles   Culture   Final    NO GROWTH 5 DAYS Performed at [facility], [place] [address] [place], [place] [address]    Report Status 05/17/2018 FINAL  Final  Culture, blood (routine x 2) Call MD if unable to obtain prior to antibiotics being given     Status: None   Collection Time: 05/11/18  8:50 PM  Result Value Ref Range  Status   Specimen Description BLOOD LEFT HAND  Final   Special Requests   Final    BOTTLES DRAWN AEROBIC ONLY Blood Culture results may not be optimal due to an excessive volume of blood received in culture bottles   Culture   Final    NO GROWTH 5 DAYS Performed at [facility], [place] [address] [place], [place] [address]    Report Status 05/17/2018 FINAL  Final       Radiology Studies: Dg Chest Port 1 View  Result Date: 05/17/2018 CLINICAL DATA:  Acute hypoxemic respiratory failure. History of congestive heart failure and diabetes. EXAM: PORTABLE CHEST 1 VIEW COMPARISON:  Radiographs 05/15/2018 and 05/14/2018. FINDINGS: 0801 hours. Two views submitted. The heart size and mediastinal contours are stable. There are fluctuating bilateral airspace opacities with current components in both lower lobes and the right upper lobe, similar to the most recent study. Pulmonary vascularity appears normal. There are probable small bilateral pleural effusions. No pneumothorax or acute osseous findings. IMPRESSION: Unchanged bilateral airspace opacities compared with most recent study of 2 days ago. These findings likely represent a combination of atelectasis and inflammation. No definite residual edema. Electronically Signed   By: Richardean Sale M.D.   On: 05/17/2018 10:08   Dg Chest Port 1 View  Result Date: 05/15/2018 CLINICAL DATA:  Initial evaluation for acute increase in shortness of breath. EXAM: PORTABLE CHEST 1 VIEW COMPARISON:  Prior radiograph from earlier the same day. FINDINGS: Stable cardiomegaly.  Mediastinal silhouette within normal limits. Lungs hypoinflated with elevation of right hemidiaphragm, similar. Dense retrocardiac left lower opacity most compatible with atelectasis. Increased confluent opacity at the inferior right upper lobe, which could reflect atelectasis and/or worsening/new infiltrate. Mildly increased right basilar atelectasis. Underlying diffuse pulmonary interstitial  edema with scattered Kerley B-lines, slightly worsened. Small bilateral pleural effusions. No pneumothorax. Osseous structures unchanged. IMPRESSION: 1. Slight interval worsening in mild diffuse pulmonary interstitial edema. Small bilateral pleural effusions. 2. Increased confluent right perihilar opacity, which could reflect progressive atelectasis and/or infiltrate. 3. Persistent bibasilar atelectasis. Electronically Signed  By: Jeannine Boga M.D.   On: 05/15/2018 23:40      Scheduled Meds: . buPROPion  100 mg Oral Daily  . docusate sodium  100 mg Oral Daily  . donepezil  23 mg Oral Daily  . dorzolamide-timolol  1 drop Left Eye BID  . ferrous sulfate  325 mg Oral Q breakfast  . insulin aspart  0-9 Units Subcutaneous TID WC  . memantine  28 mg Oral Daily  . pantoprazole  40 mg Intravenous Q12H  . simvastatin  5 mg Oral q1800   Continuous Infusions: . ampicillin-sulbactam (UNASYN) IV 3 g (05/17/18 0602)     LOS: 6 days    Time spent: 25 minutes   Dessa Phi, DO [facility] [email or web address] 05/17/2018, 10:53 AM

## 2018-05-17 NOTE — Progress Notes (Signed)
RT Note:  RN for patient told me that she was giving patient a breathing treatment.  Patient's WOB had increased.  May have to put patient back on Bipap after treatment.  Will monitor to see if treatment has helped patient and WOB.

## 2018-05-17 NOTE — Progress Notes (Addendum)
Daily Progress Note   Patient Name: Jose [name]       Date: 05/17/2018 DOB: 11-03-1930  Age: 85 y.o. MRN#: [id number] Attending Physician: Dessa Phi, DO Primary Care Physician: [name], MD Admit Date: 05/11/2018  Reason for Consultation/Follow-up: Establishing goals of care  Subjective: Patient seen right after lunch, in respiratory distress. Nurse and RT placing patient on high flow nasal cannula. Patient actively drooling. Family is at the bedside.  Revisited after patient stabilized. He is sleeping on BiPAP. Attempted again to speak with patient's wife and daughter. [name] says, "Right now is not a good time to talk". Discussed how we are concerned that we are not able to communicate to her about her husband's health condition and that we understand this is an extremely difficult situation, but hope we are acting with compassion. She says she understands, but that "right now is not a good time". She states, "I know what you are going to talk to me about". We asked if she would like [place] to come back tomorrow and she states, "yes". Will attempt again tomorrow.    Assessment: 85 year old male with advanced dementia, severe deconditioning, recurrent aspiration pna, and acute respiratory failure. Remains episodically dependent on BiPAP.   Patient Profile/HPI: 85 y.o.maleadmitted on 2/9/2020with pastmedical history significant ofadvanced dementia, DM2, CHF, HTN. Patient was admitted for PNAon 1/25, recorded as aspiration PNAthough RVP came back positive for RSV. Discharged 1/29 and readmitted one day later on 1/30 with recurrent acute respfailure with hypoxia suspected to be due to recurrent aspiration. Continued physical, functional and cognitive decline over the past several  months. Family face advanced directive decisions, advanced directive decisions and anticipatory care needs.   Length of Stay: 6  Current Medications: Scheduled Meds:  . buPROPion  100 mg Oral Daily  . docusate sodium  100 mg Oral Daily  . donepezil  23 mg Oral Daily  . dorzolamide-timolol  1 drop Left Eye BID  . ferrous sulfate  325 mg Oral Q breakfast  . insulin aspart  0-9 Units Subcutaneous TID WC  . memantine  28 mg Oral Daily  . pantoprazole  40 mg Intravenous Q12H  . simvastatin  5 mg Oral q1800    Continuous Infusions: . ampicillin-sulbactam (UNASYN) IV 3 g (05/17/18 1121)    PRN Meds: albuterol,  morphine injection, naphazoline-glycerin, polyvinyl alcohol, RESOURCE THICKENUP CLEAR  Physical Exam     General: elderly, frail male, on BiPAP Respiratory: expiratory wheezes and coarse rhonchi diffuse bilaterally, increased work of breathing Cardiac: regular rate and rhythm, no m/r/g Abdomen: soft, non-distended  Vital Signs: BP (!) 158/92   Pulse 96   Temp 98.2 F (36.8 C) (Oral)   Resp (!) 22   Ht 6\' 1"  (1.854 m)   Wt 77 kg   SpO2 93%   BMI 22.40 kg/m  SpO2: SpO2: 93 % O2 Device: O2 Device: Nasal Cannula O2 Flow Rate: O2 Flow Rate (L/min): 4 L/min  Intake/output summary:   Intake/Output Summary (Last 24 hours) at 05/17/2018 1542 Last data filed at 05/17/2018 0900 Gross per 24 hour  Intake 100 ml  Output 1900 ml  Net -1800 ml   LBM: Last BM Date: (P) 05/16/18 Baseline Weight: Weight: 77 kg Most recent weight: Weight: 77 kg       Palliative Assessment/Data: 10%    Flowsheet Rows     Most Recent Value  Intake Tab  Referral Department  Hospitalist  Unit at Time of Referral  ER  Palliative Care Primary Diagnosis  Sepsis/Infectious Disease  Date Notified  05/12/18  Palliative Care Type  Return patient Palliative Care  Reason for referral  Clarify Goals of Care  Date of Admission  05/11/18  Date first seen by Palliative Care  05/12/18  # of days  Palliative referral response time  0 Day(s)  # of days IP prior to Palliative referral  1  Clinical Assessment  Psychosocial & Spiritual Assessment  Palliative Care Outcomes      Patient Active Problem List   Diagnosis Date Noted  . Upper GI bleed   . Palliative care encounter   . Recurrent pneumonia   . Goals of care, counseling/discussion   . Eye irritation 05/11/2018  . DNR (do not resuscitate) discussion   . Palliative care by specialist   . CHF (congestive heart failure) ([place]) 05/01/2018  . Acute hypoxemic respiratory failure ([place]) 05/01/2018  . Aspiration pneumonia ([place]) 04/26/2018  . Constipation 04/15/2018  . HTN (hypertension) 04/15/2018  . Iron deficiency anemia 04/08/2018  . Actinic keratosis 06/21/2017  . Moderate protein-calorie malnutrition ([place]) 03/06/2017  . Unsteady gait 03/06/2017  . Urinary incontinence 12/31/2016  . Dysphagia 12/31/2016  . Dermatitis, seborrheic 12/14/2016  . Edema 12/14/2016  . Hyperlipidemia associated with type 2 diabetes mellitus ([place]) 12/06/2016  . Functional urinary incontinence 12/06/2016  . Chronic bilateral low back pain with bilateral sciatica 12/06/2016  . Chronic combined systolic and diastolic congestive heart failure ([place]) 12/06/2016  . Major depression, recurrent, chronic ([place]) 11/23/2016  . OAB (overactive bladder) 11/23/2016  . Dermatitis 08/23/2016  . External hemorrhoid 07/26/2016  . Depression with anxiety 07/09/2016  . Thrombocytopenia ([place]) 06/28/2016  . Closed left hip fracture, sequela 06/22/2016  . Type 2 diabetes mellitus with diabetic chronic kidney disease ([place]) 06/22/2016  . Hyperlipidemia LDL goal <70 06/22/2016  . Alzheimer disease ([place]) 11/12/2012  . History of bilateral hip replacements 11/12/2012    Palliative Care Plan    Recommendations/Plan:  DNR with BIPAP PRN  Will continue to follow up to hopefully have discussion about poor prognosis and dependency on BiPAP as life support.  Case  discussed with Dr. Maylene Roes, who recommends continued BiPAP until Monday, when a decision needs to be made. Unable to communicate with wife about a "timed trial" because she would not speak with [place] today.  [facility] unable to provide BiPAP  Goals of Care and Additional Recommendations:  Limitations on Scope of Treatment: Full Scope Treatment  Code Status:  DNR, with BiPAP PRN  Prognosis:   < 3 months, given dependency on BiPAP, recurrent aspiration pneumonia.   Discharge Planning:  To Be Determined, family hopeful he will return to [place] was discussed with patient's family  Thank you for allowing the Palliative Medicine Team to assist in the care of this patient.  Total time spent:  25 minutes     Greater than 50%  of this time was spent counseling and coordinating care related to the above assessment and plan.  Ferrel Logan, PA-S 05/17/2018  Florentina Jenny, PA-C Palliative Medicine  Please contact Palliative MedicineTeam phone at [phone] for questions and concerns between 7 am - 7 pm.   Please see AMION for individual provider pager numbers.

## 2018-05-17 NOTE — Progress Notes (Signed)
Pt was placed on progressive status and stepdown monitor applied to pt.  Pt is very diaphoretic.  Blood sugar checked and is 248.  Respiratory rate over 40 times/min.  BP 171/91.  MD text paged to make aware.  Pt placed on bipap and after 15-20 min respiratory rate still at 43 breaths/min.  Will continue to monitor.

## 2018-05-17 NOTE — Progress Notes (Signed)
Pt respiratory rate has decreased to 20's and is more comfortable.  BP down.  No longer diaphoretic.  Family remains at bedside.  Goals of care discussed and wife understands what is going on but feels in a quandary as to when to just let pt comfort feed, etc.

## 2018-05-17 NOTE — Progress Notes (Signed)
Called to room by family and caregiver stating pt having difficulty breathing after some roast beef.  While was pureed was pasty.  Unable to place pt back on bipap at this time due to just eating.  Pt was placed on high flow [place] at 12 L/min and was able to get sats up to 94%.

## 2018-05-17 NOTE — Progress Notes (Signed)
Ok to place stop date for Unasyn 2/16 to complete 7d per Dr Loreen Freud, PharmD, BCIDP, AAHIVP, CPP Infectious Disease Pharmacist 05/17/2018 12:24 PM

## 2018-05-18 LAB — CBC
HCT: 30.2 % — ABNORMAL LOW (ref 39.0–52.0)
Hemoglobin: 8.7 g/dL — ABNORMAL LOW (ref 13.0–17.0)
MCH: 27 pg (ref 26.0–34.0)
MCHC: 28.8 g/dL — ABNORMAL LOW (ref 30.0–36.0)
MCV: 93.8 fL (ref 80.0–100.0)
Platelets: 151 10*3/uL (ref 150–400)
RBC: 3.22 MIL/uL — ABNORMAL LOW (ref 4.22–5.81)
RDW: 19.8 % — ABNORMAL HIGH (ref 11.5–15.5)
WBC: 8.7 10*3/uL (ref 4.0–10.5)
nRBC: 0 % (ref 0.0–0.2)

## 2018-05-18 LAB — BASIC METABOLIC PANEL
Anion gap: 3 — ABNORMAL LOW (ref 5–15)
BUN: 16 mg/dL (ref 8–23)
CO2: 29 mmol/L (ref 22–32)
Calcium: 8.1 mg/dL — ABNORMAL LOW (ref 8.9–10.3)
Chloride: 115 mmol/L — ABNORMAL HIGH (ref 98–111)
Creatinine, Ser: 1 mg/dL (ref 0.61–1.24)
GFR calc Af Amer: >60 mL/min (ref >60)
Glucose, Bld: 117 mg/dL — ABNORMAL HIGH (ref 70–99)
Potassium: 3.2 mmol/L — ABNORMAL LOW (ref 3.5–5.1)
Sodium: 147 mmol/L — ABNORMAL HIGH (ref 135–145)

## 2018-05-18 LAB — GLUCOSE, CAPILLARY
Glucose-Capillary: 112 mg/dL — ABNORMAL HIGH (ref 70–99)
Glucose-Capillary: 226 mg/dL — ABNORMAL HIGH (ref 70–99)
Glucose-Capillary: 198 mg/dL — ABNORMAL HIGH (ref 70–99)
Glucose-Capillary: 113 mg/dL — ABNORMAL HIGH (ref 70–99)

## 2018-05-18 MED ORDER — POTASSIUM CHLORIDE 10 MEQ/100ML IV SOLN
10.0000 meq | INTRAVENOUS | Status: AC
  Administered 2018-05-18 (×4): 10 meq via INTRAVENOUS
  Filled 2018-05-18: qty 100

## 2018-05-18 NOTE — Progress Notes (Addendum)
PROGRESS NOTE    Jose [name]  RFF:[id number] DOB: Jul 31, 1930 DOA: 05/11/2018 PCP: [name], MD     Brief Narrative:  Jose [name] is an 85 y.o. male with a history of advanced dementia, T2DM, chronic combined CHF, HTN, and aspiration pneumonia who presented to the ED from SNF on 2/9 with shortness of breath after breakfast, possibly due to recurrent aspiration pneumonia. He had been hospitalized for RSV, pneumonia, and UTI treatment, discharged 1/29, returned to the ED 3 hours later with suspected recurrent aspiration. Ultimately treated and discharged on 2/3 on dysphagia diet. During this hospitalization, while undergoing treatment for pneumonia, he was found to have an acute drop in hemoglobin to 5.5. GI was consulted, recommended EGD. 2u PRBCs transfused with lasix with appropriate rise in hgb to 8.5. Unfortunately he developed respiratory distress due to pulmonary edema requiring BiPAP on 2/12. Due to tenuous respiratory status and high risk of intubation, EGD was no longer pursued. PCCM also consulted as well as Palliative care medicine.   New events last 24 hours / Subjective: Notable events last yesterday.  Yesterday afternoon, patient had aspiration event during lunch.  He was noted to be very diaphoretic, increased respiratory rate into the 40s.  He was placed on high flow nasal cannula at that time and eventually back on BiPAP.  Overnight, after breathing treatment, patient's work of breathing had increased.  He was put back on BiPAP last night.  Assessment & Plan:   Principal Problem:   Aspiration pneumonia ([place]) Active Problems:   Alzheimer disease ([place])   Type 2 diabetes mellitus with diabetic chronic kidney disease ([place])   Chronic combined systolic and diastolic congestive heart failure (HCC)   Dysphagia   HTN (hypertension)   Acute hypoxemic respiratory failure ([place])   DNR (do not resuscitate) discussion   Eye irritation   Recurrent pneumonia   Goals of care,  counseling/discussion   Palliative care encounter   Upper GI bleed   Acute respiratory failure ([place])  Acute hypoxic respiratory failure: Due to pneumonia, recurrent aspiration, and acute pulmonary edema due to volume overload from transfusions.  -Goals of care discussions ongoing, appreciate both CCM and palliative care assistance -IV morphine as needed for dyspnea, air hunger ordered by palliative care -Continues to require HFNC O2 and BiPAP. Family continuing to wish for BiPAP  -Had extended goals of care conversation with wife and daughter outside the patient's room.  We discussed patient's continued intermittent use of BiPAP, aspiration.  We discussed that patient has been in the hospital for nearly 3 weeks including previous 2 hospitalizations for respiratory failure, aspiration.  Wife continues to wish for dysphagia diet as well as BiPAP intermittently.  She states that she "will just know" when it is time to transition to comfort care.  Not ready to transition to comfort care yet as patient does have some enjoyment with family and friends visiting him in the hospital.   Aspiration pneumonia, dysphagia -Continue unasyn total 7 days  -SLP recommending dysphagia 1 diet.  NPO while on BiPAP  Symptomatic acute blood loss anemia, suspected upper GI bleeding -Discussed with GI, no current plans for EGD due to patient's high risk status -Continue IV PPI BID  -Trend Hgb, stable this morning 8.7   Acute on chronic combined HFrEF -Repeat chest x-ray reviewed independently, diffuse interstitial edema has improved in appearance from x-ray 2/13  T2DM -Sliding scale insulin  Dementia  -Delirium precautions -Continue Aricept, namenda  Depression -Continue Wellbutrin  Hypernatremia -No  IV treatments due to patient's recent history of pulmonary edema, encourage p.o. intake as able when off BiPAP  Hypokalemia -Replace, trend   DVT prophylaxis: SCD Code Status: DNR, changed after  conversation by PCCM Family Communication: Spoke with wife, daughter at bedside  Disposition Plan: Uncertain, guarded prognosis, need to improve respiratory status prior to discharge.  Family still hopeful for discharge to [place]  Consultants:   PCCM  GI  Palliative care  Procedures:   None  Antimicrobials:  Anti-infectives (From admission, onward)   Start     Dose/Rate Route Frequency Ordered Stop   05/12/18 0300  Ampicillin-Sulbactam (UNASYN) 3 g in sodium chloride 0.9 % 100 mL IVPB     3 g 200 mL/hr over 30 Minutes Intravenous Every 6 hours 05/11/18 2015 05/18/18 2359   05/11/18 1930  Ampicillin-Sulbactam (UNASYN) 3 g in sodium chloride 0.9 % 100 mL IVPB     3 g 200 mL/hr over 30 Minutes Intravenous  Once 05/11/18 1926 05/11/18 2125       Objective: Vitals:   05/18/18 0700 05/18/18 0701 05/18/18 0838 05/18/18 0846  BP: 117/66   117/71  Pulse: 84 84  76  Resp: (!) 25 (!) 28 (!) 25 (!) 25  Temp:    98.4 F (36.9 C)  TempSrc:    Oral  SpO2: 100% 100%  94%  Weight:      Height:        Intake/Output Summary (Last 24 hours) at 05/18/2018 1201 Last data filed at 05/18/2018 0600 Gross per 24 hour  Intake 200 ml  Output 400 ml  Net -200 ml   Filed Weights   05/12/18 2100  Weight: 77 kg    Examination: General exam: Appears calm and comfortable, appears very fatigued, weak Respiratory system: Diminished, on nasal cannula O2 on my examination Cardiovascular system: S1 & S2 heard, RRR. No JVD, murmurs, rubs, gallops or clicks. No pedal edema. Gastrointestinal system: Abdomen is nondistended, soft and nontender. No organomegaly or masses felt. Normal bowel sounds heard. Central nervous system: Alert  Extremities: Symmetric Psychiatry: Dementia  Data Reviewed: I have personally reviewed following labs and imaging studies  CBC: Recent Labs  Lab 05/11/18 1626 05/13/18 0251 05/13/18 0505  05/14/18 2042 05/15/18 0734 05/16/18 0315 05/17/18 0249  05/18/18 0314  WBC 15.6* 7.8 8.2  --  8.1 10.2 8.8 9.6 8.7  NEUTROABS 13.2* 5.1 5.7  --   --   --   --   --   --   HGB 7.6* 5.5* 5.5*   < > 8.3* 7.9* 7.8* 8.4* 8.7*  HCT 26.0* 19.5* 19.1*   < > 27.2* 26.9* 26.4* 27.4* 30.2*  MCV 91.2 92.9 92.7  --  89.5 91.2 91.3 92.3 93.8  PLT 182 130* 126*  --  142* 149* 141* 150 151   < > = values in this interval not displayed.   Basic Metabolic Panel: Recent Labs  Lab 05/13/18 0251 05/14/18 1351 05/15/18 0734 05/16/18 0315 05/17/18 0249 05/18/18 0314  NA 142 143 146* 147* 148* 147*  K 4.1 3.6 3.8 3.7 3.5 3.2*  CL 115* 111 112* 115* 113* 115*  CO2 21* 23 25 24 26 29   GLUCOSE 104* 189* 141* 127* 137* 117*  BUN 18 17 16 16 16 16   CREATININE 1.00 1.24 1.13 0.99 1.06 1.00  CALCIUM 7.8* 8.1* 8.0* 7.9* 8.1* 8.1*  MG 2.1  --   --   --   --   --   PHOS 3.1  --   --   --   --   --  GFR: Estimated Creatinine Clearance: 58.8 mL/min (by C-G formula based on SCr of 1 mg/dL). Liver Function Tests: Recent Labs  Lab 05/11/18 1626 05/13/18 0251  AST 37  --   ALT 27  --   ALKPHOS 86  --   BILITOT 0.5  --   PROT 5.4*  --   ALBUMIN 2.5* 2.0*   No results for input(s): LIPASE, AMYLASE in the last 168 hours. No results for input(s): AMMONIA in the last 168 hours. Coagulation Profile: No results for input(s): INR, PROTIME in the last 168 hours. Cardiac Enzymes: Recent Labs  Lab 05/11/18 1626  TROPONINI <0.03   BNP (last 3 results) No results for input(s): PROBNP in the last 8760 hours. HbA1C: No results for input(s): HGBA1C in the last 72 hours. CBG: Recent Labs  Lab 05/17/18 1144 05/17/18 1434 05/17/18 1715 05/17/18 2121 05/18/18 0845  GLUCAP 154* 264* 184* 147* 112*   Lipid Profile: No results for input(s): CHOL, HDL, LDLCALC, TRIG, CHOLHDL, LDLDIRECT in the last 72 hours. Thyroid Function Tests: No results for input(s): TSH, T4TOTAL, FREET4, T3FREE, THYROIDAB in the last 72 hours. Anemia Panel: No results for input(s):  VITAMINB12, FOLATE, FERRITIN, TIBC, IRON, RETICCTPCT in the last 72 hours. Sepsis Labs: Recent Labs  Lab 05/12/18 0943  PROCALCITON 0.15    Recent Results (from the past 240 hour(s))  Culture, blood (routine x 2) Call MD if unable to obtain prior to antibiotics being given     Status: None   Collection Time: 05/11/18  8:30 PM  Result Value Ref Range Status   Specimen Description BLOOD LEFT ARM  Final   Special Requests   Final    BOTTLES DRAWN AEROBIC AND ANAEROBIC Blood Culture results may not be optimal due to an excessive volume of blood received in culture bottles   Culture   Final    NO GROWTH 5 DAYS Performed at [facility], [place] [address] [place], [place] [address]    Report Status 05/17/2018 FINAL  Final  Culture, blood (routine x 2) Call MD if unable to obtain prior to antibiotics being given     Status: None   Collection Time: 05/11/18  8:50 PM  Result Value Ref Range Status   Specimen Description BLOOD LEFT HAND  Final   Special Requests   Final    BOTTLES DRAWN AEROBIC ONLY Blood Culture results may not be optimal due to an excessive volume of blood received in culture bottles   Culture   Final    NO GROWTH 5 DAYS Performed at [facility], [place] [address] [place], [place] [address]    Report Status 05/17/2018 FINAL  Final       Radiology Studies: Dg Chest Port 1 View  Result Date: 05/17/2018 CLINICAL DATA:  Acute hypoxemic respiratory failure. History of congestive heart failure and diabetes. EXAM: PORTABLE CHEST 1 VIEW COMPARISON:  Radiographs 05/15/2018 and 05/14/2018. FINDINGS: 0801 hours. Two views submitted. The heart size and mediastinal contours are stable. There are fluctuating bilateral airspace opacities with current components in both lower lobes and the right upper lobe, similar to the most recent study. Pulmonary vascularity appears normal. There are probable small bilateral pleural effusions. No pneumothorax or acute osseous findings.  IMPRESSION: Unchanged bilateral airspace opacities compared with most recent study of 2 days ago. These findings likely represent a combination of atelectasis and inflammation. No definite residual edema. Electronically Signed   By: Richardean Sale M.D.   On: 05/17/2018 10:08  Scheduled Meds: . buPROPion  100 mg Oral Daily  . docusate sodium  100 mg Oral Daily  . donepezil  23 mg Oral Daily  . dorzolamide-timolol  1 drop Left Eye BID  . ferrous sulfate  325 mg Oral Q breakfast  . insulin aspart  0-9 Units Subcutaneous TID WC  . memantine  28 mg Oral Daily  . pantoprazole  40 mg Intravenous Q12H  . simvastatin  5 mg Oral q1800   Continuous Infusions: . ampicillin-sulbactam (UNASYN) IV 3 g (05/18/18 0548)     LOS: 7 days    Time spent: 40 minutes   Dessa Phi, DO [facility] [email or web address] 05/18/2018, 12:01 PM

## 2018-05-18 NOTE — Progress Notes (Signed)
Patient was taken off BIPAP and placed on 6 LPM HFNC for a break. RN made aware. Vitals are stable. No distress at this time. Rt will continue to monitor.

## 2018-05-19 DIAGNOSIS — G301 Alzheimer's disease with late onset: Secondary | ICD-10-CM

## 2018-05-19 LAB — CBC
HCT: 28.7 % — ABNORMAL LOW (ref 39.0–52.0)
Hemoglobin: 8.2 g/dL — ABNORMAL LOW (ref 13.0–17.0)
MCH: 26.9 pg (ref 26.0–34.0)
MCHC: 28.6 g/dL — ABNORMAL LOW (ref 30.0–36.0)
MCV: 94.1 fL (ref 80.0–100.0)
Platelets: 147 10*3/uL — ABNORMAL LOW (ref 150–400)
RBC: 3.05 MIL/uL — ABNORMAL LOW (ref 4.22–5.81)
RDW: 19.9 % — ABNORMAL HIGH (ref 11.5–15.5)
WBC: 8.2 10*3/uL (ref 4.0–10.5)
nRBC: 0 % (ref 0.0–0.2)

## 2018-05-19 LAB — GLUCOSE, CAPILLARY
Glucose-Capillary: 138 mg/dL — ABNORMAL HIGH (ref 70–99)
Glucose-Capillary: 235 mg/dL — ABNORMAL HIGH (ref 70–99)
Glucose-Capillary: 191 mg/dL — ABNORMAL HIGH (ref 70–99)
Glucose-Capillary: 115 mg/dL — ABNORMAL HIGH (ref 70–99)

## 2018-05-19 LAB — BASIC METABOLIC PANEL
Anion gap: 5 (ref 5–15)
BUN: 20 mg/dL (ref 8–23)
CO2: 28 mmol/L (ref 22–32)
Calcium: 8 mg/dL — ABNORMAL LOW (ref 8.9–10.3)
Chloride: 117 mmol/L — ABNORMAL HIGH (ref 98–111)
Creatinine, Ser: 0.97 mg/dL (ref 0.61–1.24)
GFR calc Af Amer: >60 mL/min (ref >60)
GFR calc non Af Amer: >60 mL/min (ref >60)
Glucose, Bld: 152 mg/dL — ABNORMAL HIGH (ref 70–99)
Potassium: 3.7 mmol/L (ref 3.5–5.1)
Sodium: 150 mmol/L — ABNORMAL HIGH (ref 135–145)

## 2018-05-19 LAB — MAGNESIUM: Magnesium: 2.1 mg/dL (ref 1.7–2.4)

## 2018-05-19 MED ORDER — FREE WATER: 100.0000 mL | Freq: Three times a day (TID) | Status: DC

## 2018-05-19 MED ORDER — FREE WATER
200.0000 mL | Freq: Three times a day (TID) | Status: DC
  Administered 2018-05-19 – 2018-05-26 (×13): 200 mL via ORAL

## 2018-05-19 MED ORDER — FREE WATER: 200.0000 mL | Freq: Three times a day (TID) | Status: DC

## 2018-05-19 MED ORDER — LORAZEPAM 2 MG/ML IJ SOLN: 1.0000 mg | Freq: Every evening | INTRAMUSCULAR | Status: DC | PRN

## 2018-05-19 NOTE — Progress Notes (Signed)
PROGRESS NOTE    Jose [name]  Jose [name] DOB: [birth year] DOA: 05/11/2018 PCP: [name], MD     Brief Narrative:  Jose [name] is an 85 y.o. male with a history of advanced dementia, T2DM, chronic combined CHF, HTN, and aspiration pneumonia who presented to the ED from SNF on 2/9 with shortness of breath after breakfast, possibly due to recurrent aspiration pneumonia. He had been hospitalized for RSV, pneumonia, and UTI treatment, discharged 1/29, returned to the ED 3 hours later with suspected recurrent aspiration. Ultimately treated and discharged on 2/3 on dysphagia diet. During this hospitalization, while undergoing treatment for pneumonia, he was found to have an acute drop in hemoglobin to 5.5. GI was consulted, recommended EGD. 2u PRBCs transfused with lasix with appropriate rise in hgb to 8.5. Unfortunately he developed respiratory distress due to pulmonary edema requiring BiPAP on 2/12. Due to tenuous respiratory status and high risk of intubation, EGD was no longer pursued. PCCM also consulted as well as Palliative care medicine.   New events last 24 hours / Subjective: Walked into patient's room as he had just finished eating breakfast and aspirated. He was being put back on BiPAP, HR in the 110s, RR in the 50s, SpO2 70%. On BiPAP, his vital signs started on improve and SpO2 came back up into the 90%s. I spoke with caregiver at bedside.   Evaluated patient again later in the morning, wife and daughter at bedside. We had an extended conversation regarding his care. They tell me that patient had a "perfect" day yesterday, was off BiPAP all day, until wife left the hospital at night and he became anxious. They ask about if there is an option to pursue hospice at [facility] if they were to make that decision. Also asking about anxiety medication prn qhs. They're having difficult time making decision to transition him to comfort care because he has great days sometimes, didn't aspirate  at all yesterday, is able to enjoy visitors.   Assessment & Plan:   Principal Problem:   Aspiration pneumonia ([place]) Active Problems:   Alzheimer disease ([place])   Type 2 diabetes mellitus with diabetic chronic kidney disease ([place])   Chronic combined systolic and diastolic congestive heart failure (HCC)   Dysphagia   HTN (hypertension)   Acute hypoxemic respiratory failure ([place])   DNR (do not resuscitate) discussion   Eye irritation   Recurrent pneumonia   Goals of care, counseling/discussion   Palliative care encounter   Upper GI bleed   Acute respiratory failure ([place])  Acute hypoxic respiratory failure: Due to pneumonia, recurrent aspiration, and acute pulmonary edema due to volume overload from transfusions.  -Goals of care discussions ongoing, appreciate both CCM and palliative care assistance -IV morphine as needed for dyspnea, air hunger ordered by palliative care -Continues to require HFNC O2 and BiPAP. Family continuing to wish for BiPAP   Aspiration pneumonia, dysphagia -Complete unasyn total 7 days  -SLP recommending dysphagia 1 diet.  NPO while on BiPAP  Symptomatic acute blood loss anemia, suspected upper GI bleeding -Discussed with GI, no current plans for EGD due to patient's high risk status -Continue IV PPI BID  -Trend Hgb, stable today   Acute on chronic combined HFrEF -Repeat chest x-ray reviewed independently, diffuse interstitial edema has improved in appearance from x-ray 2/13  T2DM -Sliding scale insulin  Dementia  -Delirium precautions -Continue Aricept, namenda  Depression -Continue Wellbutrin  Hypernatremia -No IV treatments due to patient's recent history of pulmonary edema,  encourage p.o. intake as able when off BiPAP. Free water q8h ordered, discussed with caregiver at bedside to encourage free water when off BiPAP    DVT prophylaxis: SCD Code Status: DNR, changed after conversation by PCCM Family Communication: Spoke with wife,  daughter at bedside   Disposition Plan: Uncertain, guarded prognosis, need to improve respiratory status prior to discharge.  Family still hopeful for discharge to [place]  Consultants:   PCCM  GI  Palliative care  Procedures:   None  Antimicrobials:  Anti-infectives (From admission, onward)   Start     Dose/Rate Route Frequency Ordered Stop   05/12/18 0300  Ampicillin-Sulbactam (UNASYN) 3 g in sodium chloride 0.9 % 100 mL IVPB     3 g 200 mL/hr over 30 Minutes Intravenous Every 6 hours 05/11/18 2015 05/18/18 2054   05/11/18 1930  Ampicillin-Sulbactam (UNASYN) 3 g in sodium chloride 0.9 % 100 mL IVPB     3 g 200 mL/hr over 30 Minutes Intravenous  Once 05/11/18 1926 05/11/18 2125       Objective: Vitals:   05/18/18 2300 05/19/18 0300 05/19/18 0730 05/19/18 0854  BP: (!) 165/97 120/67 (!) 154/92   Pulse: 86 78 81 (!) 109  Resp: (!) 28 19 20  (!) 42  Temp: 98.1 F (36.7 C) 98.5 F (36.9 C) 98 F (36.7 C)   TempSrc: Oral Oral    SpO2: 99% 99% 93% 97%  Weight:      Height:        Intake/Output Summary (Last 24 hours) at 05/19/2018 1016 Last data filed at 05/18/2018 2200 Gross per 24 hour  Intake -  Output 450 ml  Net -450 ml   Filed Weights   05/12/18 2100  Weight: 77 kg    Examination: General exam: Appears in respiratory distress Respiratory system: Diminished breath sounds, RR 50s  Cardiovascular system: S1 & S2 heard, tachycardic, regular rhythm.  Gastrointestinal system: Abdomen is nondistended, soft and nontender. Central nervous system: Alert  Extremities: Symmetric  Psychiatry: Dementia    Data Reviewed: I have personally reviewed following labs and imaging studies  CBC: Recent Labs  Lab 05/13/18 0251 05/13/18 0505  05/15/18 0734 05/16/18 0315 05/17/18 0249 05/18/18 0314 05/19/18 0306  WBC 7.8 8.2   < > 10.2 8.8 9.6 8.7 8.2  NEUTROABS 5.1 5.7  --   --   --   --   --   --   HGB 5.5* 5.5*   < > 7.9* 7.8* 8.4* 8.7* 8.2*  HCT 19.5*  19.1*   < > 26.9* 26.4* 27.4* 30.2* 28.7*  MCV 92.9 92.7   < > 91.2 91.3 92.3 93.8 94.1  PLT 130* 126*   < > 149* 141* 150 151 147*   < > = values in this interval not displayed.   Basic Metabolic Panel: Recent Labs  Lab 05/13/18 0251  05/15/18 0734 05/16/18 0315 05/17/18 0249 05/18/18 0314 05/19/18 0306  NA 142   < > 146* 147* 148* 147* 150*  K 4.1   < > 3.8 3.7 3.5 3.2* 3.7  CL 115*   < > 112* 115* 113* 115* 117*  CO2 21*   < > 25 24 26 29 28   GLUCOSE 104*   < > 141* 127* 137* 117* 152*  BUN 18   < > 16 16 16 16 20   CREATININE 1.00   < > 1.13 0.99 1.06 1.00 0.97  CALCIUM 7.8*   < > 8.0* 7.9* 8.1* 8.1* 8.0*  MG  2.1  --   --   --   --   --  2.1  PHOS 3.1  --   --   --   --   --   --    < > = values in this interval not displayed.   GFR: Estimated Creatinine Clearance: 60.6 mL/min (by C-G formula based on SCr of 0.97 mg/dL). Liver Function Tests: Recent Labs  Lab 05/13/18 0251  ALBUMIN 2.0*   No results for input(s): LIPASE, AMYLASE in the last 168 hours. No results for input(s): AMMONIA in the last 168 hours. Coagulation Profile: No results for input(s): INR, PROTIME in the last 168 hours. Cardiac Enzymes: No results for input(s): CKTOTAL, CKMB, CKMBINDEX, TROPONINI in the last 168 hours. BNP (last 3 results) No results for input(s): PROBNP in the last 8760 hours. HbA1C: No results for input(s): HGBA1C in the last 72 hours. CBG: Recent Labs  Lab 05/18/18 0845 05/18/18 1205 05/18/18 1719 05/18/18 2140 05/19/18 0750  GLUCAP 112* 226* 198* 113* 138*   Lipid Profile: No results for input(s): CHOL, HDL, LDLCALC, TRIG, CHOLHDL, LDLDIRECT in the last 72 hours. Thyroid Function Tests: No results for input(s): TSH, T4TOTAL, FREET4, T3FREE, THYROIDAB in the last 72 hours. Anemia Panel: No results for input(s): VITAMINB12, FOLATE, FERRITIN, TIBC, IRON, RETICCTPCT in the last 72 hours. Sepsis Labs: No results for input(s): PROCALCITON, LATICACIDVEN in the last 168  hours.  Recent Results (from the past 240 hour(s))  Culture, blood (routine x 2) Call MD if unable to obtain prior to antibiotics being given     Status: None   Collection Time: 05/11/18  8:30 PM  Result Value Ref Range Status   Specimen Description BLOOD LEFT ARM  Final   Special Requests   Final    BOTTLES DRAWN AEROBIC AND ANAEROBIC Blood Culture results may not be optimal due to an excessive volume of blood received in culture bottles   Culture   Final    NO GROWTH 5 DAYS Performed at [facility], [place] [address] [place], [place] [address]    Report Status 05/17/2018 FINAL  Final  Culture, blood (routine x 2) Call MD if unable to obtain prior to antibiotics being given     Status: None   Collection Time: 05/11/18  8:50 PM  Result Value Ref Range Status   Specimen Description BLOOD LEFT HAND  Final   Special Requests   Final    BOTTLES DRAWN AEROBIC ONLY Blood Culture results may not be optimal due to an excessive volume of blood received in culture bottles   Culture   Final    NO GROWTH 5 DAYS Performed at [facility], [place] [address] [place], [place] [address]    Report Status 05/17/2018 FINAL  Final       Radiology Studies: No results found.    Scheduled Meds: . buPROPion  100 mg Oral Daily  . docusate sodium  100 mg Oral Daily  . donepezil  23 mg Oral Daily  . dorzolamide-timolol  1 drop Left Eye BID  . ferrous sulfate  325 mg Oral Q breakfast  . free water  200 mL Oral Q8H  . insulin aspart  0-9 Units Subcutaneous TID WC  . memantine  28 mg Oral Daily  . pantoprazole  40 mg Intravenous Q12H  . simvastatin  5 mg Oral q1800   Continuous Infusions:    LOS: 8 days    Time spent: 45 minutes   Anderson Malta  Maylene Roes, DO [facility] [email or web address] 05/19/2018, 10:16 AM

## 2018-05-19 NOTE — Progress Notes (Signed)
Per family request removed BIPAP and placed patient on 6lpm [place].

## 2018-05-19 NOTE — Care Management Important Message (Signed)
Important Message  Patient Details  Name: Jose [name] MRN: [id number] Date of Birth: Jun 07, 1930   Medicare Important Message Given:  Yes    Orbie Pyo 05/19/2018, 3:22 PM

## 2018-05-19 NOTE — Progress Notes (Signed)
Palliative:   Mr. Aceituno is lying quietly in bed, BiPAP in place.  He will make and mostly keep eye contact, and is able to make his basic needs known.  I asked how he is feeling and he indicates so-so.  I ask if he feels that he is getting enough to eat, he nods yes, I ask if he feels he is sleeping good at night, he also nods yes.  Present today at bedside is wife [name], daughter Lovey Newcomer, and a paid caregiver.  Initially Mrs. Tapscott and Revere state they want to go outside to talk, but I reassure them I am there just to be an extra layer of support, and feel like we can talk in the room.    Today we basically concentrate on a "life review".    Mrs. Milne and Lovey Newcomer share that Mr. Wamble had a very good day yesterday.  They share that he had several visitors, old friends stopped by to see him.  Lovey Newcomer shares that he was clearheaded and able to communicate well.  She goes further to state that this is their "new perfect", although as she says that she does have a pained look on her face.  Mrs. 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  Mrs. Coopersmith shares that the company was started by his grandfather.  Mrs. 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  She shares that he also "helped many people".  She and Lovey Newcomer tells stories of how Mr. Sagraves encouraged younger workers to go into their chosen professions/follow their passion.  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  Jenny Reichmann also jokes stating that as an attorney, he helped some of his employees "get out of jail".  They tell me that Lovey Newcomer currently works at the [employer].  We talked about machining and the focus of [employer], also the [employer] and furniture making industries in the [place].  Mrs. Colley shares that her husband has been at "[facility]" for approximately 2 years now.  She states they have a private pay bed and also hire [employer] to  provide private duty sitters.  Lovey Newcomer shares that they are continuing to pay for his bed at [facility] while he is hospitalized.  Lovey Newcomer tells me that the current sitter is one of her father's favorites.  Laughing, she states that they have threatened to "chain her to the bed".  I looked to the sitter and encourage her by saying "don't let them do that".  Lovey Newcomer asks about this NP schedule, and when we will follow-up.  I share that palliative medicine team will continue to follow, but at this point, it is not likely that they will need daily visits.  Conference with SW related to disposition and [facility].  Conference with hospitalist related to goals of care discussion, disposition plan, difficult situation related to family's desire for Mr. Kennan to continue in what they see as good quality of life.  62 minutes Quinn Axe, NP  Palliative Medicine Team  Team Phone # [phone]  Greater than 50% of this time was spent counseling and coordinating care related to the above assessment and plan.

## 2018-05-19 NOTE — Progress Notes (Signed)
SLP Cancellation Note  Patient Details Name: Jose [name] MRN: [id number] DOB: Jul 25, 1930   Cancelled treatment:       Reason Eval/Treat Not Completed: Medical issues which prohibited therapy. Pt was on BiPAP this morning while SLP was on the floor. Per chart review it appears as though he has been off BiPAP this afternoon, but SLP was not able to return. Will continue efforts.    Venita Sheffield Charmion Hapke 05/19/2018, 4:45 PM  Germain Osgood Anupama Piehl, M.A. [place] Acute Environmental education officer [phone] Office [phone]

## 2018-05-20 DIAGNOSIS — R627 Adult failure to thrive: Secondary | ICD-10-CM

## 2018-05-20 LAB — GLUCOSE, CAPILLARY
GLUCOSE-CAPILLARY: 130 mg/dL — AB (ref 70–99)
Glucose-Capillary: 196 mg/dL — ABNORMAL HIGH (ref 70–99)
Glucose-Capillary: 221 mg/dL — ABNORMAL HIGH (ref 70–99)
Glucose-Capillary: 178 mg/dL — ABNORMAL HIGH (ref 70–99)

## 2018-05-20 LAB — BASIC METABOLIC PANEL
Anion gap: 6 (ref 5–15)
BUN: 22 mg/dL (ref 8–23)
CO2: 28 mmol/L (ref 22–32)
Calcium: 8.3 mg/dL — ABNORMAL LOW (ref 8.9–10.3)
Chloride: 117 mmol/L — ABNORMAL HIGH (ref 98–111)
Creatinine, Ser: 0.99 mg/dL (ref 0.61–1.24)
GFR calc Af Amer: >60 mL/min (ref >60)
Glucose, Bld: 151 mg/dL — ABNORMAL HIGH (ref 70–99)
Potassium: 3.7 mmol/L (ref 3.5–5.1)
Sodium: 151 mmol/L — ABNORMAL HIGH (ref 135–145)

## 2018-05-20 LAB — CBC
HCT: 30.1 % — ABNORMAL LOW (ref 39.0–52.0)
Hemoglobin: 8.7 g/dL — ABNORMAL LOW (ref 13.0–17.0)
MCH: 27.8 pg (ref 26.0–34.0)
MCHC: 28.9 g/dL — ABNORMAL LOW (ref 30.0–36.0)
MCV: 96.2 fL (ref 80.0–100.0)
PLATELETS: 155 10*3/uL (ref 150–400)
RBC: 3.13 MIL/uL — ABNORMAL LOW (ref 4.22–5.81)
RDW: 20.1 % — AB (ref 11.5–15.5)
WBC: 8.8 10*3/uL (ref 4.0–10.5)
nRBC: 0 % (ref 0.0–0.2)

## 2018-05-20 MED ORDER — DEXTROSE-NACL 5-0.45 % IV SOLN
INTRAVENOUS | Status: DC
  Administered 2018-05-20 – 2018-05-21 (×2): via INTRAVENOUS

## 2018-05-20 NOTE — NC FL2 (Signed)
[place] LEVEL OF CARE SCREENING TOOL     IDENTIFICATION  Patient Name: Jose [name] Birthdate: [birth year] Sex: male Admission Date (Current Location): 05/11/2018  [facility] and [place] Number:  Herbalist and Address:  [facility] [place]. [facility], [place] [address], [place], [place] [address]      Provider Number: [id number]  Attending Physician Name and Address:  Dessa Phi, DO  Relative Name and Phone Number:  Bertis, Hustead [phone]    Current Level of Care: Hospital Recommended Level of Care: [place] Prior Approval Number:    Date Approved/Denied:   PASRR Number: [id number]  Discharge Plan: SNF    Current Diagnoses: Patient Active Problem List   Diagnosis Date Noted  . Upper GI bleed   . Acute respiratory failure ([place])   . Palliative care encounter   . Recurrent pneumonia   . Goals of care, counseling/discussion   . Eye irritation 05/11/2018  . DNR (do not resuscitate) discussion   . Palliative care by specialist   . CHF (congestive heart failure) ([place]) 05/01/2018  . Acute hypoxemic respiratory failure ([place]) 05/01/2018  . Aspiration pneumonia ([place]) 04/26/2018  . Constipation 04/15/2018  . HTN (hypertension) 04/15/2018  . Iron deficiency anemia 04/08/2018  . Actinic keratosis 06/21/2017  . Moderate protein-calorie malnutrition ([place]) 03/06/2017  . Unsteady gait 03/06/2017  . Urinary incontinence 12/31/2016  . Dysphagia 12/31/2016  . Dermatitis, seborrheic 12/14/2016  . Edema 12/14/2016  . Hyperlipidemia associated with type 2 diabetes mellitus ([place]) 12/06/2016  . Functional urinary incontinence 12/06/2016  . Chronic bilateral low back pain with bilateral sciatica 12/06/2016  . Chronic combined systolic and diastolic congestive heart failure ([place]) 12/06/2016  . Major depression, recurrent, chronic ([place]) 11/23/2016  . OAB (overactive bladder) 11/23/2016  . Dermatitis 08/23/2016  . External hemorrhoid  07/26/2016  . Depression with anxiety 07/09/2016  . Thrombocytopenia ([place]) 06/28/2016  . Closed left hip fracture, sequela 06/22/2016  . Type 2 diabetes mellitus with diabetic chronic kidney disease ([place]) 06/22/2016  . Hyperlipidemia LDL goal <70 06/22/2016  . Alzheimer disease ([place]) 11/12/2012  . History of bilateral hip replacements 11/12/2012    Orientation RESPIRATION BLADDER Height & Weight     Self  O2(Nasal Cannula 6L) External catheter, Incontinent(placed 04/25/18) Weight: 169 lb 12.1 oz (77 kg) Height:  6\' 1"  (185.4 cm)  BEHAVIORAL SYMPTOMS/MOOD NEUROLOGICAL BOWEL NUTRITION STATUS      Incontinent Diet(DYS 1 diet, nectar thick liquids)  AMBULATORY STATUS COMMUNICATION OF NEEDS Skin   Extensive Assist Verbally Normal                       Personal Care Assistance Level of Assistance  Bathing, Feeding, Dressing Bathing Assistance: Maximum assistance Feeding assistance: Limited assistance Dressing Assistance: Maximum assistance     Functional Limitations Info  Sight, Hearing, Speech Sight Info: Adequate Hearing Info: Adequate Speech Info: Adequate    SPECIAL CARE FACTORS FREQUENCY  PT (By licensed PT), OT (By licensed OT)                    Contractures Contractures Info: Not present    Additional Factors Info  Code Status, Allergies Code Status Info: DNR Allergies Info: Axona Bacid, Gabapentin           Current Medications (05/20/2018):  This is the current hospital active medication list Current Facility-Administered Medications  Medication Dose Route Frequency Provider Last Rate Last Dose  . albuterol (PROVENTIL) (2.5 MG/3ML)  0.083% nebulizer solution 2.5 mg  2.5 mg Nebulization Q4H PRN Dessa Phi, DO   2.5 mg at 05/17/18 2045  . buPROPion [facility] SR) 12 hr tablet 100 mg  100 mg Oral Daily Jennette Kettle M, DO   100 mg at 05/20/18 0836  . dextrose 5 %-0.45 % sodium chloride infusion   Intravenous Continuous Dessa Phi, DO 50 mL/hr  at 05/20/18 1031    . docusate sodium (COLACE) capsule 100 mg  100 mg Oral Daily Jennette Kettle M, DO   100 mg at 05/20/18 8088  . donepezil (ARICEPT) tablet 23 mg  23 mg Oral Daily Jennette Kettle M, DO   23 mg at 05/20/18 0836  . dorzolamide-timolol (COSOPT) 22.3-6.8 MG/ML ophthalmic solution 1 drop  1 drop Left Eye BID Jennette Kettle M, DO   1 drop at 05/20/18 0836  . ferrous sulfate tablet 325 mg  325 mg Oral Q breakfast Etta Quill, DO   325 mg at 05/20/18 1103  . free water 200 mL  200 mL Oral Q8H Dessa Phi, DO   200 mL at 05/19/18 1243  . insulin aspart (novoLOG) injection 0-9 Units  0-9 Units Subcutaneous TID WC Dessa Phi, DO   1 Units at 05/20/18 0800  . LORazepam (ATIVAN) injection 1 mg  1 mg Intravenous Q6H PRN Dessa Phi, DO      . LORazepam (ATIVAN) injection 1 mg  1 mg Intravenous QHS PRN Dessa Phi, DO      . memantine (NAMENDA XR) 24 hr capsule 28 mg  28 mg Oral Daily Jennette Kettle M, DO   28 mg at 05/20/18 0835  . morphine 2 MG/ML injection 1 mg  1 mg Intravenous Q2H PRN Dellinger, Bobby Rumpf, PA-C   1 mg at 05/19/18 2151  . naphazoline-glycerin (CLEAR EYES REDNESS) ophth solution 1-2 drop  1-2 drop Right Eye QID PRN Etta Quill, DO      . pantoprazole (PROTONIX) injection 40 mg  40 mg Intravenous Q12H Dana Allan I, MD   40 mg at 05/20/18 0834  . polyvinyl alcohol (LIQUIFILM TEARS) 1.4 % ophthalmic solution 1 drop  1 drop Both Eyes PRN Dana Allan I, MD   1 drop at 05/18/18 2103  . RESOURCE THICKENUP CLEAR   Oral PRN Dana Allan I, MD      . simvastatin (ZOCOR) tablet 5 mg  5 mg Oral q1800 Etta Quill, DO   5 mg at 05/19/18 1708     Discharge Medications: Please see discharge summary for a list of discharge medications.  Relevant Imaging Results:  Relevant Lab Results:   Additional Information SS# [id number]  Eileen Stanford, LCSW

## 2018-05-20 NOTE — Evaluation (Addendum)
Physical Therapy Evaluation Patient Details Name: Jose [name] MRN: [id number] DOB: [birth year] Today's Date: 05/20/2018   History of Present Illness  Pt is an 85 y.o. male admitted from [facility] on 05/11/18 with SOB after eating breakfast. Worked up for respiratory failure due to PNA, recurrent aspiration and acute pulmonary edema; requiring intermittent bipap. PMH includes dementia, HF, DM2, HTN, dysphagia, depression. Of note, two recent hospital admissions (initial d/c 04/30/18 with same day readmission for PNA and d/c 05/05/18).    Clinical Impression  Pt presents with an overall decrease in functional mobility secondary to above. PTA, pt resides at [facility] with 24/7 caregiver support; was ambulatory with RW in January, but has since been primarily bedbound requiring lift transfer. Today, pt required modA+2 for rolling, dependent for pericare from bowel incontinence; declined sitting EOB or OOB due to fatigue. Family plans to have pt return to [facility] with continued caregiver support and PT/OT. Will follow acutely to address established goals.    Follow Up Recommendations SNF;Home health PT(family plans to have pt return to [facility] with continued 24/7 support)    Equipment Recommendations  None recommended by PT    Recommendations for Other Services       Precautions / Restrictions Precautions Precautions: Fall Restrictions Weight Bearing Restrictions: No      Mobility  Bed Mobility Overal bed mobility: Needs Assistance Bed Mobility: Rolling Rolling: Mod assist;+2 for safety/equipment         General bed mobility comments: Pt with bowel incontinence; modA+2 to roll R/L for pericare, pt able to assist with bed rails once cued (rolled towards L-side better than right); dependent for pericare. Pt declining sitting EOB. Recommend OOB transfers with [facility] for nursing staff  Transfers                    Ambulation/Gait                 Stairs            Wheelchair Mobility    Modified Rankin (Stroke Patients Only)       Balance                                             Pertinent Vitals/Pain Pain Assessment: No/denies pain(Nodded 'no' to pain) Faces Pain Scale: No hurt Pain Intervention(s): Monitored during session    Home Living Family/patient expects to be discharged to:: Skilled nursing facility                 Additional Comments: [place]    Prior Function Level of Independence: Needs assistance   Gait / Transfers Assistance Needed: Prior to initial admission 04/2018, pt was ambulating short distances with RW and supervision from aides. Since then, has been requiring assist to sit EOB; OOB with lift   ADL's / Homemaking Assistance Needed: Assist for all ADLs. Immediately PTA, using bed pans  Comments: has comfort keepers 24/7     Hand Dominance        Extremity/Trunk Assessment   Upper Extremity Assessment Upper Extremity Assessment: Generalized weakness    Lower Extremity Assessment Lower Extremity Assessment: RLE deficits/detail;LLE deficits/detail RLE Deficits / Details: L hip flex <3/5, knee ext 3/5, knee flex 3/5, ankle <3/5 LLE Deficits / Details: L hip flex <3/5, knee ext 3/5, knee flex 3/5,  ankle <3/5       Communication   Communication: HOH;Expressive difficulties(minimal verbalizations)  Cognition Arousal/Alertness: Awake/alert Behavior During Therapy: Flat affect Overall Cognitive Status: History of cognitive impairments - at baseline                                 General Comments: Minimal verbalizations (said "yes" and answered his height), nodding yes/no appropriately. Inconsistently following commands. Intermittently keeping eyes closed      General Comments General comments (skin integrity, edema, etc.): Wife, daughter, caregiver present during session    Exercises     Assessment/Plan    PT  Assessment Patient needs continued PT services  PT Problem List Decreased strength;Decreased activity tolerance;Decreased mobility;Decreased knowledge of precautions;Decreased cognition;Decreased balance;Cardiopulmonary status limiting activity       PT Treatment Interventions Functional mobility training;Therapeutic activities;Patient/family education;Therapeutic exercise;DME instruction;Gait training;Balance training    PT Goals (Current goals can be found in the Care Plan section)  Acute Rehab PT Goals Patient Stated Goal: to be able to walk again PT Goal Formulation: With family Time For Goal Achievement: 06/03/18 Potential to Achieve Goals: Fair    Frequency Min 2X/week   Barriers to discharge        Co-evaluation               AM-PAC PT "6 Clicks" Mobility  Outcome Measure Help needed turning from your back to your side while in a flat bed without using bedrails?: A Lot Help needed moving from lying on your back to sitting on the side of a flat bed without using bedrails?: A Lot Help needed moving to and from a bed to a chair (including a wheelchair)?: Total Help needed standing up from a chair using your arms (e.g., wheelchair or bedside chair)?: Total Help needed to walk in hospital room?: Total Help needed climbing 3-5 steps with a railing? : Total 6 Click Score: 8    End of Session Equipment Utilized During Treatment: Oxygen Activity Tolerance: Patient limited by fatigue Patient left: in bed;with bed alarm set;with call bell/phone within reach;with family/visitor present Nurse Communication: Mobility status;Need for lift equipment PT Visit Diagnosis: Other abnormalities of gait and mobility (R26.89);Muscle weakness (generalized) (M62.81)    Time: 9458-5929 PT Time Calculation (min) (ACUTE ONLY): 24 min   Charges:   PT Evaluation $PT Eval Moderate Complexity: 1 Mod PT Treatments $Therapeutic Activity: 8-22 mins      Mabeline Caras, PT, DPT Acute  Rehabilitation Services  Pager [phone] Office [place] 05/20/2018, 5:15 PM

## 2018-05-20 NOTE — Progress Notes (Signed)
PROGRESS NOTE    Jose [name]  WNU:[id number] DOB: December 27, 1930 DOA: 05/11/2018 PCP: [name], MD     Brief Narrative:  Jose [name] is an 85 y.o. male with a history of advanced dementia, T2DM, chronic combined CHF, HTN, and aspiration pneumonia who presented to the ED from SNF on 2/9 with shortness of breath after breakfast, possibly due to recurrent aspiration pneumonia. He had been hospitalized for RSV, pneumonia, and UTI treatment, discharged 1/29, returned to the ED 3 hours later with suspected recurrent aspiration. Ultimately treated and discharged on 2/3 on dysphagia diet. During this hospitalization, while undergoing treatment for pneumonia, he was found to have an acute drop in hemoglobin to 5.5. GI was consulted, recommended EGD. 2u PRBCs transfused with lasix with appropriate rise in hgb to 8.5. Unfortunately he developed respiratory distress due to pulmonary edema requiring BiPAP on 2/12. Due to tenuous respiratory status and high risk of intubation, EGD was no longer pursued. PCCM also consulted as well as Palliative care medicine. Ongoing recurrent aspiration requiring intermittent BiPAP care. Family wants to continue effort to get him stable enough for discharge to nursing home.   New events last 24 hours / Subjective: Patient remained off BiPAP most of the day yesterday, required it last night due to respiratory distress. This morning, he ate breakfast without aspiration episode, currently remains on HFNC O2 without distress.   Assessment & Plan:   Principal Problem:   Aspiration pneumonia ([place]) Active Problems:   Alzheimer disease ([place])   Type 2 diabetes mellitus with diabetic chronic kidney disease ([place])   Chronic combined systolic and diastolic congestive heart failure (HCC)   Dysphagia   HTN (hypertension)   Acute hypoxemic respiratory failure ([place])   DNR (do not resuscitate) discussion   Eye irritation   Recurrent pneumonia   Goals of care, counseling/discussion  Palliative care encounter   Upper GI bleed   Acute respiratory failure ([place])  Acute hypoxic respiratory failure: Due to pneumonia, recurrent aspiration, and acute pulmonary edema due to volume overload from transfusions.  -Goals of care discussions ongoing, appreciate both CCM and palliative care assistance -IV morphine as needed for dyspnea, air hunger ordered by palliative care -Continues to require HFNC O2 and intermittent BiPAP. Family continuing to wish for BiPAP   Aspiration pneumonia, dysphagia -Completed unasyn total 7 days  -SLP recommending dysphagia 1 diet.  NPO while on BiPAP  Symptomatic acute blood loss anemia, suspected upper GI bleeding -Discussed with GI, no current plans for EGD due to patient's high risk status -Continue IV PPI BID  -Trend Hgb, stable today 8.7   Acute on chronic combined HFrEF -Repeat chest x-ray reviewed independently, diffuse interstitial edema has improved in appearance from x-ray 2/13  T2DM -Sliding scale insulin  Dementia  -Delirium precautions -Continue Aricept, namenda  Depression -Continue Wellbutrin  Hypernatremia -Continues to trend upward. Start low-rate D5-0.45NC and monitor    DVT prophylaxis: SCD Code Status: DNR, changed after conversation by PCCM Family Communication: Caregiver at bedside  Disposition Plan: Uncertain, guarded prognosis, need to improve respiratory status prior to discharge.  Family still hopeful for discharge to [place]  Consultants:   PCCM  GI  Palliative care  Procedures:   None  Antimicrobials:  Anti-infectives (From admission, onward)   Start     Dose/Rate Route Frequency Ordered Stop   05/12/18 0300  Ampicillin-Sulbactam (UNASYN) 3 g in sodium chloride 0.9 % 100 mL IVPB     3 g 200 mL/hr over 30 Minutes  Intravenous Every 6 hours 05/11/18 2015 05/18/18 2054   05/11/18 1930  Ampicillin-Sulbactam (UNASYN) 3 g in sodium chloride 0.9 % 100 mL IVPB     3 g 200 mL/hr over 30  Minutes Intravenous  Once 05/11/18 1926 05/11/18 2125       Objective: Vitals:   05/20/18 0000 05/20/18 0400 05/20/18 0744 05/20/18 0810  BP: 109/71 119/76 138/86   Pulse: 94 80 81 63  Resp: (!) 21 (!) 24 19 20   Temp: 98.4 F (36.9 C) 98.1 F (36.7 C) 98.2 F (36.8 C)   TempSrc: Axillary Axillary Oral   SpO2: 98% 98% 100% 96%  Weight:      Height:        Intake/Output Summary (Last 24 hours) at 05/20/2018 1019 Last data filed at 05/20/2018 0600 Gross per 24 hour  Intake 200 ml  Output 400 ml  Net -200 ml   Filed Weights   05/12/18 2100  Weight: 77 kg    Examination: General exam: Appears calm and comfortable  Respiratory system: Diminished breath sounds, no distress on HF [place] O2  Cardiovascular system: S1 & S2 heard, RRR. No JVD, murmurs, rubs, gallops or clicks. No pedal edema. Gastrointestinal system: Abdomen is nondistended, soft and nontender. No organomegaly or masses felt. Normal bowel sounds heard. Central nervous system: Alert  Extremities: Symmetric  Skin: No rashes, lesions or ulcers on exposed skin  Psychiatry: Dementia    Data Reviewed: I have personally reviewed following labs and imaging studies  CBC: Recent Labs  Lab 05/16/18 0315 05/17/18 0249 05/18/18 0314 05/19/18 0306 05/20/18 0255  WBC 8.8 9.6 8.7 8.2 8.8  HGB 7.8* 8.4* 8.7* 8.2* 8.7*  HCT 26.4* 27.4* 30.2* 28.7* 30.1*  MCV 91.3 92.3 93.8 94.1 96.2  PLT 141* 150 151 147* 528   Basic Metabolic Panel: Recent Labs  Lab 05/16/18 0315 05/17/18 0249 05/18/18 0314 05/19/18 0306 05/20/18 0255  NA 147* 148* 147* 150* 151*  K 3.7 3.5 3.2* 3.7 3.7  CL 115* 113* 115* 117* 117*  CO2 24 26 29 28 28   GLUCOSE 127* 137* 117* 152* 151*  BUN 16 16 16 20 22   CREATININE 0.99 1.06 1.00 0.97 0.99  CALCIUM 7.9* 8.1* 8.1* 8.0* 8.3*  MG  --   --   --  2.1  --    GFR: Estimated Creatinine Clearance: 59.4 mL/min (by C-G formula based on SCr of 0.99 mg/dL). Liver Function Tests: No results for  input(s): AST, ALT, ALKPHOS, BILITOT, PROT, ALBUMIN in the last 168 hours. No results for input(s): LIPASE, AMYLASE in the last 168 hours. No results for input(s): AMMONIA in the last 168 hours. Coagulation Profile: No results for input(s): INR, PROTIME in the last 168 hours. Cardiac Enzymes: No results for input(s): CKTOTAL, CKMB, CKMBINDEX, TROPONINI in the last 168 hours. BNP (last 3 results) No results for input(s): PROBNP in the last 8760 hours. HbA1C: No results for input(s): HGBA1C in the last 72 hours. CBG: Recent Labs  Lab 05/19/18 0750 05/19/18 1138 05/19/18 1702 05/19/18 2105 05/20/18 0743  GLUCAP 138* 235* 191* 115* 130*   Lipid Profile: No results for input(s): CHOL, HDL, LDLCALC, TRIG, CHOLHDL, LDLDIRECT in the last 72 hours. Thyroid Function Tests: No results for input(s): TSH, T4TOTAL, FREET4, T3FREE, THYROIDAB in the last 72 hours. Anemia Panel: No results for input(s): VITAMINB12, FOLATE, FERRITIN, TIBC, IRON, RETICCTPCT in the last 72 hours. Sepsis Labs: No results for input(s): PROCALCITON, LATICACIDVEN in the last 168 hours.  Recent Results (from  the past 240 hour(s))  Culture, blood (routine x 2) Call MD if unable to obtain prior to antibiotics being given     Status: None   Collection Time: 05/11/18  8:30 PM  Result Value Ref Range Status   Specimen Description BLOOD LEFT ARM  Final   Special Requests   Final    BOTTLES DRAWN AEROBIC AND ANAEROBIC Blood Culture results may not be optimal due to an excessive volume of blood received in culture bottles   Culture   Final    NO GROWTH 5 DAYS Performed at [facility], [place] [address], [place], [place] [address]    Report Status 05/17/2018 FINAL  Final  Culture, blood (routine x 2) Call MD if unable to obtain prior to antibiotics being given     Status: None   Collection Time: 05/11/18  8:50 PM  Result Value Ref Range Status   Specimen Description BLOOD LEFT HAND  Final   Special Requests   Final      BOTTLES DRAWN AEROBIC ONLY Blood Culture results may not be optimal due to an excessive volume of blood received in culture bottles   Culture   Final    NO GROWTH 5 DAYS Performed at [facility], [place] [address], [place], [place] [address]    Report Status 05/17/2018 FINAL  Final       Radiology Studies: No results found.    Scheduled Meds: . buPROPion  100 mg Oral Daily  . docusate sodium  100 mg Oral Daily  . donepezil  23 mg Oral Daily  . dorzolamide-timolol  1 drop Left Eye BID  . ferrous sulfate  325 mg Oral Q breakfast  . free water  200 mL Oral Q8H  . insulin aspart  0-9 Units Subcutaneous TID WC  . memantine  28 mg Oral Daily  . pantoprazole  40 mg Intravenous Q12H  . simvastatin  5 mg Oral q1800   Continuous Infusions:    LOS: 9 days    Time spent: 20 minutes   Dessa Phi, DO [facility] [email or web address] 05/20/2018, 10:19 AM

## 2018-05-20 NOTE — Progress Notes (Signed)
  Speech Language Pathology Treatment: Dysphagia  Patient Details Name: Jose [name] MRN: [id number] DOB: May 31, 1930 Today's Date: 05/20/2018 Time: 2863-8177 SLP Time Calculation (min) (ACUTE ONLY): 16 min  Assessment / Plan / Recommendation Clinical Impression  Pt was subdued but alert and participatory throughout dysphagia treatment session today. He accepted nectar thick liquid and pudding trials with min assist verbal cues to initiate intake. Pt demonstrated one delayed cough throughout session and did not appear in direct response to PO intake. Per family and RN, pt's intake and comfort during meals is at its best during this hospitalization with current diet texture recommendations. Pt reports satisfaction with current diet as well. Recommend continue dysphagia 1 (puree) diet, nectar thick liquids and full supervision to assist with self-feeding and cue for small bites/sips. ST will continue to provide treatment in acute setting to ensure diet safety and efficiency.   HPI HPI: Pt is an 85 yo male admitted with recurrent PNA (third admission since the end of January). CXR showed R>L basilar opacities. Pt had two prior MBS (2018, 2020) that both recommended thin liquids. Most recent MBS 04/29/18 with recommendation for regular/thin with small bolus sizes, self-administered if possible, with second swallow to clear residue. Meds whole in puree. He most recently discharged on Dys 3 diet and thin liquids by cup after BSE 05/03/18, but per MD note he was changed to nectar thick liquids at SNF. PMH: dementia, DM, CHF, PNA      SLP Plan  Continue with current plan of care       Recommendations  Diet recommendations: Dysphagia 1 (puree);Nectar-thick liquid Liquids provided via: No straw;Cup Medication Administration: Whole meds with puree Supervision: Staff to assist with self feeding;Patient able to self feed;Full supervision/cueing for compensatory strategies Compensations: Minimize  environmental distractions;Small sips/bites;Slow rate;Multiple dry swallows after each bite/sip Postural Changes and/or Swallow Maneuvers: Upright 30-60 min after meal;Seated upright 90 degrees                Oral Care Recommendations: Oral care BID Follow up Recommendations: Skilled Nursing facility SLP Visit Diagnosis: Dysphagia, unspecified (R13.10) Plan: Continue with current plan of care       Jettie Booze, Student SLP                 Jettie Booze 05/20/2018, 1:22 PM

## 2018-05-20 NOTE — Progress Notes (Signed)
Patient ID: Jose [name], male   DOB: [birth year], 85 y.o.   MRN: [id number]  This NP visited patient at the bedside as a follow up for palliative medicine needs and emotional support.  Daughter/her husband and caregiver at bedside.  Patient appears to be sleeping but awakens to name and gentle touch.  He is able to tell me his daughter and son-in-law's names.  Family report that patient is tolerating orals "better "now that they know how to utilize the bed and put it in a chair position. Daughter verbalizes her concern that the patient continues to require BiPAP at night.  Family is investigating utilization and purchase of BiPAP for outpatient setting.  Verbalize their concern that "everyone that comes into the room is trying to jam down their throats that we should let him go".   Daughter verbalizes strongly that she wants everyone to know that she and her family understand that her father has dementia and that long-term prognosis is poor however at this time they will not shift to a full comfort approach. " He still knows [place], he still makes jokes, he is not in pain and to [place] this is quality of life"  I offered emotional support and verbalized an understanding of how difficult this situation and related decisions can be.  Questions and concerns addressed.  We discussed the fact that people/family have different values and belief systems that affect their interpretation of quality of life.  Encouraged them to verbalize their concerns, their questions and to continue to advocate for their family member as they see fit.  I did reiterate the natural trajectory of end-stage dementia, his high risk of aspiration and related pneumonias, high risk for decompensation and overall failure to thrive.    We again discussed the limitations of medical interventions to provide ongoing quality of life when the body begins to fail to thrive.  Daughter verbalizes an understanding of all these issues ever that does  not affect their current decisions related to desired treatment plan.  Discussed with family the importance of continued conversation with each other and the medical providers regarding overall plan of care and treatment options,  ensuring decisions are within the context of the patients values and GOCs.  Questions and concerns addressed   Discussed with Charge nurse/Bill RN  Total time spent on the unit was 35 minutes  PMT continue to support holistically  Greater than 50% of the time was spent in counseling and coordination of care  Wadie Lessen NP  Palliative Medicine Team Team Phone # [phone] Pager [phone]

## 2018-05-20 NOTE — Clinical Social Work Note (Signed)
CSW needs a PT note in order to start insurance authorization.  [place], [place]

## 2018-05-21 ENCOUNTER — Inpatient Hospital Stay (HOSPITAL_COMMUNITY): Payer: Medicare Other

## 2018-05-21 LAB — CBC
HEMATOCRIT: 28.5 % — AB (ref 39.0–52.0)
Hemoglobin: 8.1 g/dL — ABNORMAL LOW (ref 13.0–17.0)
MCH: 27.2 pg (ref 26.0–34.0)
MCHC: 28.4 g/dL — ABNORMAL LOW (ref 30.0–36.0)
MCV: 95.6 fL (ref 80.0–100.0)
Platelets: 132 10*3/uL — ABNORMAL LOW (ref 150–400)
RBC: 2.98 MIL/uL — ABNORMAL LOW (ref 4.22–5.81)
RDW: 19.8 % — AB (ref 11.5–15.5)
WBC: 7.4 10*3/uL (ref 4.0–10.5)
nRBC: 0 % (ref 0.0–0.2)

## 2018-05-21 LAB — BASIC METABOLIC PANEL
Anion gap: 5 (ref 5–15)
BUN: 19 mg/dL (ref 8–23)
CO2: 26 mmol/L (ref 22–32)
Calcium: 8 mg/dL — ABNORMAL LOW (ref 8.9–10.3)
Chloride: 117 mmol/L — ABNORMAL HIGH (ref 98–111)
Creatinine, Ser: 0.92 mg/dL (ref 0.61–1.24)
GFR calc Af Amer: >60 mL/min (ref >60)
GFR calc non Af Amer: >60 mL/min (ref >60)
Glucose, Bld: 157 mg/dL — ABNORMAL HIGH (ref 70–99)
Potassium: 3.5 mmol/L (ref 3.5–5.1)
Sodium: 148 mmol/L — ABNORMAL HIGH (ref 135–145)

## 2018-05-21 LAB — GLUCOSE, CAPILLARY
GLUCOSE-CAPILLARY: 140 mg/dL — AB (ref 70–99)
Glucose-Capillary: 258 mg/dL — ABNORMAL HIGH (ref 70–99)
Glucose-Capillary: 201 mg/dL — ABNORMAL HIGH (ref 70–99)
Glucose-Capillary: 168 mg/dL — ABNORMAL HIGH (ref 70–99)

## 2018-05-21 MED ORDER — FUROSEMIDE 10 MG/ML IJ SOLN
40.0000 mg | Freq: Once | INTRAMUSCULAR | Status: AC
  Administered 2018-05-21: 40 mg via INTRAVENOUS
  Filled 2018-05-21: qty 4

## 2018-05-21 MED ORDER — MORPHINE SULFATE (PF) 2 MG/ML IV SOLN: 0.5000 mg | INTRAVENOUS | Status: DC | PRN

## 2018-05-21 MED ORDER — PANTOPRAZOLE SODIUM 40 MG PO TBEC
40.0000 mg | DELAYED_RELEASE_TABLET | Freq: Every day | ORAL | Status: DC
  Administered 2018-05-21 – 2018-05-26 (×6): 40 mg via ORAL
  Filled 2018-05-21 (×6): qty 1

## 2018-05-21 NOTE — Progress Notes (Signed)
BiPAP placed by RN. Setting checked by RT. Refer to flowsheet.

## 2018-05-21 NOTE — Progress Notes (Addendum)
Occupational Therapy Evaluation Patient Details Name: Jose [name] MRN: [id number] DOB: 10-Nov-1930 Today's Date: 05/21/2018    History of Present Illness Pt is an 85 y.o. male admitted from [facility] on 05/11/18 with SOB after eating breakfast. Worked up for respiratory failure due to PNA, recurrent aspiration and acute pulmonary edema; requiring intermittent bipap. PMH includes dementia, HF, DM2, HTN, dysphagia, depression. Of note, two recent hospital admissions (initial d/c 04/30/18 with same day readmission for PNA and d/c 05/05/18).   Clinical Impression   Per pt's caregiver, PTA, pt was living at [facility], and received assistance with ADLs and used RW  With functional mobility in January, but has been bedbound requiring lift transfer since. Pt currently requires modA+2 for bed mobility and totalA for pericare completion. Pt is currently able to complete grooming after setup-A while supported in bed. Due to decline in current level of function, pt would benefit from acute OT to address establish goals to facilitate safe D/C to [facility]. Per PT, pt's family plans to have pt return to [facility] with continued caregiver support and OT/PT. Will continue to follow acutely.       Follow Up Recommendations  SNF;Supervision/Assistance - 24 hour Home health PT(family plans to have pt return to [facility] with continued 24/7 support)   Equipment Recommendations       Recommendations for Other Services       Precautions / Restrictions Precautions Precautions: Fall Restrictions Weight Bearing Restrictions: No      Mobility Bed Mobility Overal bed mobility: Needs Assistance Bed Mobility: Rolling Rolling: Mod assist;+2 for safety/equipment         General bed mobility comments: modA+2 to roll R/L for pericare, pt assisted with use of bed rails once cuedd;totalA for pericare  Transfers Overall transfer level: Needs assistance                General transfer comment: did not attempt OOB transfer this session, pt will require use of maximove    Balance                                           ADL either performed or assessed with clinical judgement   ADL Overall ADL's : Needs assistance/impaired Eating/Feeding: Moderate assistance;Sitting Eating/Feeding Details (indicate cue type and reason): assist to load spoon, drinks with supervision Grooming: Wash/dry face;Set up;Sitting;Bed level Grooming Details (indicate cue type and reason): pt able to complete grooming while sitting supine in bed Upper Body Bathing: Moderate assistance;Sitting   Lower Body Bathing: Total assistance;Bed level   Upper Body Dressing : Maximal assistance;Sitting Upper Body Dressing Details (indicate cue type and reason): pt initiated arms through arm holes of gown Lower Body Dressing: Total assistance;Bed level   Toilet Transfer: +2 for physical assistance;Moderate assistance Toilet Transfer Details (indicate cue type and reason): pt incontinent;totalA +2 for pericare while rolling R/L in bed Toileting- Clothing Manipulation and Hygiene: Bed level;+2 for physical assistance;Moderate assistance Toileting - Clothing Manipulation Details (indicate cue type and reason): totalA for pericare;pt provided assistance rolling R/L wtih modA+2     Functional mobility during ADLs: Maximal assistance;+2 for physical assistance;Moderate assistance General ADL Comments: positioned upright for self feeding and grooming     Vision         Perception     Praxis      Pertinent Vitals/Pain Pain  Assessment: No/denies pain     Hand Dominance Right   Extremity/Trunk Assessment Upper Extremity Assessment Upper Extremity Assessment: Generalized weakness   Lower Extremity Assessment Lower Extremity Assessment: Defer to PT evaluation       Communication Communication Communication: HOH;Expressive difficulties(minimal verbalizations)    Cognition Arousal/Alertness: Awake/alert Behavior During Therapy: Flat affect Overall Cognitive Status: History of cognitive impairments - at baseline                                 General Comments: minimal verbalizations, nodding yes/no appropriately. Inconsistently following commands.    General Comments  pt's caregiver present during session;educated/demonstrated caregiver on importance of positioning in bed, positional changes, and BUE/BLUE ROM exercises 3x/day;    Exercises     Shoulder Instructions      Home Living Family/patient expects to be discharged to:: Skilled nursing facility                                 Additional Comments: [facility]      Prior Functioning/Environment Level of Independence: Needs assistance  Gait / Transfers Assistance Needed: Prior to initial admission 04/2018, pt was ambulating short distances with RW and supervision from aides. Since then, has been requiring assist to sit EOB; OOB with lift  ADL's / Homemaking Assistance Needed: Assist for all ADLs.pt's aide reports he is able to self-feed with handoverhand assistance prn  Immediately PTA, using bed pans   Comments: has comfort keepers 24/7        OT Problem List: Decreased strength;Impaired balance (sitting and/or standing);Decreased coordination;Decreased cognition;Decreased safety awareness;Impaired UE functional use;Decreased knowledge of use of DME or AE      OT Treatment/Interventions:      OT Goals(Current goals can be found in the care plan section) Acute Rehab OT Goals Patient Stated Goal: to maximize pt's independence OT Goal Formulation: With patient/family Time For Goal Achievement: 06/04/18 Potential to Achieve Goals: Fair  OT Frequency:     Barriers to D/C:            Co-evaluation              AM-PAC OT "6 Clicks" Daily Activity     Outcome Measure Help from another person eating meals?: A Lot Help from another person  taking care of personal grooming?: A Lot Help from another person toileting, which includes using toliet, bedpan, or urinal?: Total Help from another person bathing (including washing, rinsing, drying)?: A Lot Help from another person to put on and taking off regular upper body clothing?: A Lot Help from another person to put on and taking off regular lower body clothing?: Total 6 Click Score: 10   End of Session Equipment Utilized During Treatment: Oxygen Nurse Communication: Mobility status  Activity Tolerance: Patient tolerated treatment well Patient left: in bed;with call bell/phone within reach;with family/visitor present  OT Visit Diagnosis: Muscle weakness (generalized) (M62.81);Other symptoms and signs involving cognitive function                Time: 1275-1700 OT Time Calculation (min): 34 min Charges:  OT General Charges $OT Visit: 1 Visit OT Evaluation $OT Eval Moderate Complexity: 1 Mod OT Treatments $Self Care/Home Management : 8-22 mins  Dorinda Hill OTR/L Acute Rehabilitation Services Office: [place] 05/21/2018, 10:25 AM

## 2018-05-21 NOTE — Progress Notes (Signed)
Took pt off of BiPAP and placed on 6L HFNC.  No distress noted.  Pt is resting comfortably.

## 2018-05-21 NOTE — Progress Notes (Signed)
PROGRESS NOTE    Jose [name]   TDV:[id number]  DOB: [birth year]  DOA: 05/11/2018 PCP: [name], MD   Brief Narrative:  Jose [name] y.o.malewith a history of advanced dementia who is bedbound with T2DM, chronic combined CHF, HTN, and current episodes of aspiration pneumonia who presented to the ED from SNF on 2/9 with shortness of breath after breakfast, possibly due to recurrent aspiration pneumonia.  He had been hospitalized for RSV, pneumonia, and UTI treatment, discharged 1/29, returned to the ED 3 hours later with suspected recurrent aspiration.  During this hospitalization, while undergoing treatment for pneumonia, he was found to have an acute drop in hemoglobin to 5.5. GI was consulted, recommended EGD. 2u PRBCs transfused with lasix with appropriate rise in hgb to 8.5. Unfortunately he developed respiratory distress due to pulmonary edema requiring BiPAP on 2/12. Due to tenuous respiratory status and high risk of intubation, EGD was no longer pursued. PCCM also consulted as well as Palliative care medicine.   This is his third hospitalization in the past 2 months.  He returned on 2/9 for shortness of breath once again thought to be secondary to aspiration.  At baseline he is on 4 L of oxygen.  Subjective: He has no complaints    Assessment & Plan:   Principal Problem:   Aspiration pneumonia- dysphasia -due to issues with recurrent aspiration, a palliative care consult has been requested -At this time, the patient's family does not want to pursue comfort care however he is DO NOT RESUSCITATE - Continue dysphagia 1 diet with nectar thick liquids -Completed a course of antibiotics -The patient is currently requiring 6 L of high flow nasal cannula -Last chest x-ray obtained on 2/15 revealed bilateral lower lobe opacities  Active Problems: Acute blood loss anemia with suspected upper GI bleed The patient is a high risk for EGD and therefore currently no further  investigation is planned Continue twice daily PPI and continue to follow hemoglobin intermittently -Continue oral iron    Alzheimer disease  -Continue Namenda and Aricept    Type 2 diabetes mellitus with diabetic chronic kidney disease -Continue sliding scale insulin-Lantus, Metformin and Januvia are on hold    Chronic combined systolic and diastolic congestive heart failure  -Currently compensated  Depression/anxiety - Continue Wellbutrin    Time spent in minutes: 35 minutes DVT prophylaxis: SCDs Code Status: DO NOT RESUSCITATE Family Communication: No family at bedside Disposition Plan: Return to skilled nursing facility Consultants:   Palliative care  GI  Pulmonary critical care Procedures:   None Antimicrobials:  Anti-infectives (From admission, onward)   Start     Dose/Rate Route Frequency Ordered Stop   05/12/18 0300  Ampicillin-Sulbactam (UNASYN) 3 g in sodium chloride 0.9 % 100 mL IVPB     3 g 200 mL/hr over 30 Minutes Intravenous Every 6 hours 05/11/18 2015 05/18/18 2054   05/11/18 1930  Ampicillin-Sulbactam (UNASYN) 3 g in sodium chloride 0.9 % 100 mL IVPB     3 g 200 mL/hr over 30 Minutes Intravenous  Once 05/11/18 1926 05/11/18 2125       Objective: Vitals:   05/21/18 0200 05/21/18 0313 05/21/18 0700 05/21/18 0833  BP: 123/66  133/86   Pulse: 78 77 80 76  Resp: (!) 23 18 20  (!) 24  Temp:   98.7 F (37.1 C)   TempSrc:   Axillary   SpO2: 100% 100% 99% 96%  Weight:      Height:  Intake/Output Summary (Last 24 hours) at 05/21/2018 1216 Last data filed at 05/21/2018 0600 Gross per 24 hour  Intake 1211.45 ml  Output 400 ml  Net 811.45 ml   Filed Weights   05/12/18 2100  Weight: 77 kg    Examination: General exam: Appears comfortable- nonverbal HEENT: PERRLA, oral mucosa moist, no sclera icterus or thrush Respiratory system: Clear to auscultation. Respiratory effort normal. Cardiovascular system: S1 & S2 heard, RRR.     Gastrointestinal system: Abdomen soft, non-tender, nondistended. Normal bowel sounds. Central nervous system: Alert -moves upper extremities Extremities: No cyanosis, clubbing or edema Skin: No rashes or ulcers Psychiatry: Recall to assess as he is nonverbal but appears calm and cooperative    Data Reviewed: I have personally reviewed following labs and imaging studies  CBC: Recent Labs  Lab 05/17/18 0249 05/18/18 0314 05/19/18 0306 05/20/18 0255 05/21/18 0338  WBC 9.6 8.7 8.2 8.8 7.4  HGB 8.4* 8.7* 8.2* 8.7* 8.1*  HCT 27.4* 30.2* 28.7* 30.1* 28.5*  MCV 92.3 93.8 94.1 96.2 95.6  PLT 150 151 147* 155 086*   Basic Metabolic Panel: Recent Labs  Lab 05/17/18 0249 05/18/18 0314 05/19/18 0306 05/20/18 0255 05/21/18 0338  NA 148* 147* 150* 151* 148*  K 3.5 3.2* 3.7 3.7 3.5  CL 113* 115* 117* 117* 117*  CO2 26 29 28 28 26   GLUCOSE 137* 117* 152* 151* 157*  BUN 16 16 20 22 19   CREATININE 1.06 1.00 0.97 0.99 0.92  CALCIUM 8.1* 8.1* 8.0* 8.3* 8.0*  MG  --   --  2.1  --   --    GFR: Estimated Creatinine Clearance: 63.9 mL/min (by C-G formula based on SCr of 0.92 mg/dL). Liver Function Tests: No results for input(s): AST, ALT, ALKPHOS, BILITOT, PROT, ALBUMIN in the last 168 hours. No results for input(s): LIPASE, AMYLASE in the last 168 hours. No results for input(s): AMMONIA in the last 168 hours. Coagulation Profile: No results for input(s): INR, PROTIME in the last 168 hours. Cardiac Enzymes: No results for input(s): CKTOTAL, CKMB, CKMBINDEX, TROPONINI in the last 168 hours. BNP (last 3 results) No results for input(s): PROBNP in the last 8760 hours. HbA1C: No results for input(s): HGBA1C in the last 72 hours. CBG: Recent Labs  Lab 05/20/18 1142 05/20/18 1701 05/20/18 2109 05/21/18 0759 05/21/18 1156  GLUCAP 196* 221* 178* 140* 258*   Lipid Profile: No results for input(s): CHOL, HDL, LDLCALC, TRIG, CHOLHDL, LDLDIRECT in the last 72 hours. Thyroid Function  Tests: No results for input(s): TSH, T4TOTAL, FREET4, T3FREE, THYROIDAB in the last 72 hours. Anemia Panel: No results for input(s): VITAMINB12, FOLATE, FERRITIN, TIBC, IRON, RETICCTPCT in the last 72 hours. Urine analysis:    Component Value Date/Time   COLORURINE YELLOW 05/01/2018 1109   APPEARANCEUR CLEAR 05/01/2018 1109   LABSPEC 1.011 05/01/2018 1109   PHURINE 6.0 05/01/2018 1109   GLUCOSEU NEGATIVE 05/01/2018 1109   HGBUR LARGE (A) 05/01/2018 1109   BILIRUBINUR NEGATIVE 05/01/2018 1109   [place] 05/01/2018 1109   PROTEINUR NEGATIVE 05/01/2018 1109   UROBILINOGEN 0.2 06/24/2014 2112   NITRITE NEGATIVE 05/01/2018 1109   LEUKOCYTESUR NEGATIVE 05/01/2018 1109   Sepsis Labs: @LABRCNTIP (procalcitonin:4,lacticidven:4) ) Recent Results (from the past 240 hour(s))  Culture, blood (routine x 2) Call MD if unable to obtain prior to antibiotics being given     Status: None   Collection Time: 05/11/18  8:30 PM  Result Value Ref Range Status   Specimen Description BLOOD LEFT ARM  Final  Special Requests   Final    BOTTLES DRAWN AEROBIC AND ANAEROBIC Blood Culture results may not be optimal due to an excessive volume of blood received in culture bottles   Culture   Final    NO GROWTH 5 DAYS Performed at [facility], [place] [address], [place], [place] [address]    Report Status 05/17/2018 FINAL  Final  Culture, blood (routine x 2) Call MD if unable to obtain prior to antibiotics being given     Status: None   Collection Time: 05/11/18  8:50 PM  Result Value Ref Range Status   Specimen Description BLOOD LEFT HAND  Final   Special Requests   Final    BOTTLES DRAWN AEROBIC ONLY Blood Culture results may not be optimal due to an excessive volume of blood received in culture bottles   Culture   Final    NO GROWTH 5 DAYS Performed at [facility], [place] [address] [place], [place] [address]    Report Status 05/17/2018 FINAL  Final         Radiology  Studies: No results found.    Scheduled Meds: . buPROPion  100 mg Oral Daily  . docusate sodium  100 mg Oral Daily  . donepezil  23 mg Oral Daily  . dorzolamide-timolol  1 drop Left Eye BID  . ferrous sulfate  325 mg Oral Q breakfast  . free water  200 mL Oral Q8H  . insulin aspart  0-9 Units Subcutaneous TID WC  . memantine  28 mg Oral Daily  . pantoprazole  40 mg Intravenous Q12H  . simvastatin  5 mg Oral q1800   Continuous Infusions: . dextrose 5 % and 0.45% NaCl 50 mL/hr at 05/21/18 0432     LOS: 10 days      Debbe Odea, MD [facility] Pager: [email or web address] Password [facility] 05/21/2018, 12:16 PM

## 2018-05-21 NOTE — Progress Notes (Signed)
  Speech Language Pathology Treatment: Dysphagia  Patient Details Name: Jose [name] MRN: [id number] DOB: 03/26/1931 Today's Date: 05/21/2018 Time: 2620-3559 SLP Time Calculation (min) (ACUTE ONLY): 15 min  Assessment / Plan / Recommendation Clinical Impression  Pt was seen for skilled dysphagia treatment session this morning. In comparison to yesterday's visit, pt was better able to answer questions and engage in very simply conversation to communicate wants and needs with SLP. Pt displayed 2 instances of immediate cough throughout PO trials - 1 following nectar thick coke and 1 following applesauce. Pt required min verbal cues and assist to participate in trials. Pt continues to require intermittent BiPAP and respiratory status is tenuous, which increases his risk for aspiration. However, pt, RN, and pt's caregiver reported satisfaction with pt's current PO intake (believed to be getting closer to baseline). Recommend continue dysphagia 1 (puree) diet, nectar thick liquids with no straws, full supervision to ensure use of swallow precautions and assist with self-feeding. ST will continue to follow to provide treatment with diet safety and efficiency while pt is in acute setting.    HPI HPI: Pt is an 85 yo male admitted with recurrent PNA (third admission since the end of January). CXR showed R>L basilar opacities. Pt had two prior MBS (2018, 2020) that both recommended thin liquids. Most recent MBS 04/29/18 with recommendation for regular/thin with small bolus sizes, self-administered if possible, with second swallow to clear residue. Meds whole in puree. He most recently discharged on Dys 3 diet and thin liquids by cup after BSE 05/03/18, but per MD note he was changed to nectar thick liquids at SNF. PMH: dementia, DM, CHF, PNA      SLP Plan  Continue with current plan of care       Recommendations  Diet recommendations: Dysphagia 1 (puree);Nectar-thick liquid Liquids provided via: No  straw;Cup Medication Administration: Whole meds with puree Supervision: Staff to assist with self feeding;Patient able to self feed;Full supervision/cueing for compensatory strategies Compensations: Minimize environmental distractions;Small sips/bites;Slow rate;Multiple dry swallows after each bite/sip Postural Changes and/or Swallow Maneuvers: Upright 30-60 min after meal;Seated upright 90 degrees                Oral Care Recommendations: Oral care BID Follow up Recommendations: Skilled Nursing facility SLP Visit Diagnosis: Dysphagia, unspecified (R13.10) Plan: Continue with current plan of care       Jettie Booze, Student SLP                 Jettie Booze 05/21/2018, 11:29 AM

## 2018-05-21 NOTE — Clinical Social Work Note (Signed)
CSW spoke with [facility] regarding pt returning tomorrow. Bobetta at [facility] states pt has to be off Bipap without the idea of using it for backup because they do not do Bipap there. MD notified. Clinical Social Worker will sign off for now as social work intervention is no longer needed. Please consult [place] again if new need arises.   Shelton Silvas A Aby Gessel 05/21/2018

## 2018-05-21 NOTE — Progress Notes (Signed)
MD aware

## 2018-05-21 NOTE — Progress Notes (Signed)
Patient ID: Jose [name], male   DOB: [birth year], 85 y.o.   MRN: [id number]  This NP visited patient at the bedside as a follow up for palliative medicine needs and emotional support.  Daughter, and caregiver and wife  at bedside.  Patient appear is alert and follows simple commands. He recognizes family at bedside.   Continued conversation regarding rationale of use of BiPAP at night.  Requested that respiratory therapy participate in this conversation comparing BiPAP and CPAP. She tells me that if BiPAP is the only thing keeping him here in the hospital that is a family they are willing to purchase BiPAP for home use.   We discussed the logistics and hurdles of making that feasible and first of all if it is even indicated.  Family was requesting pulmonary consult.  I spoke with Dr. Wynelle Cleveland and she in turn spoke with the family directly.    Dr. Wynelle Cleveland outlined the plan of care moving forward.  Morphine and Ativan were discontinued.   BiPAP will be discontinued with the understanding that O2 can be adjusted to hopefully meet oxygen needs at night.  Family are on board with this plan and are very hopeful that this will be the solution.  However they are adamant that if through the night he gets into trouble they would want BiPAP to be utilized until they could get here and make further decisions.  I offered emotional support and verbalized an understanding of how difficult this situation and related decisions can be.  Questions and concerns addressed. I did reiterate the natural trajectory of end-stage dementia, his high risk of aspiration and related pneumonias, high risk for decompensation and overall failure to thrive.   I continue to help this family understand that even though the patient patient is at his cognitive baseline he continues to decline both physically and functionally..   We again discussed the limitations of medical interventions to provide ongoing quality of life when the body  begins to fail to thrive.  Discussed with family the importance of continued conversation with each other and the medical providers regarding overall plan of care and treatment options,  ensuring decisions are within the context of the patients values and GOCs.  Questions and concerns addressed   Discussed with Dr Wynelle Cleveland  Total time spent on the unit was 60 minutes  PMT continue to support holistically and follow up in the morning  Greater than 50% of the time was spent in counseling and coordination of care  Wadie Lessen NP  Palliative Medicine Team Team Phone # [phone] Pager [phone]

## 2018-05-21 NOTE — Clinical Social Work Note (Signed)
Auth initiated with [employer].   [place], [place]

## 2018-05-22 ENCOUNTER — Inpatient Hospital Stay (HOSPITAL_COMMUNITY): Payer: Medicare Other

## 2018-05-22 LAB — BASIC METABOLIC PANEL
Anion gap: 8 (ref 5–15)
BUN: 20 mg/dL (ref 8–23)
CALCIUM: 8.1 mg/dL — AB (ref 8.9–10.3)
CO2: 25 mmol/L (ref 22–32)
Chloride: 113 mmol/L — ABNORMAL HIGH (ref 98–111)
Creatinine, Ser: 1.01 mg/dL (ref 0.61–1.24)
GFR calc non Af Amer: >60 mL/min (ref >60)
Glucose, Bld: 217 mg/dL — ABNORMAL HIGH (ref 70–99)
Potassium: 3.5 mmol/L (ref 3.5–5.1)
Sodium: 146 mmol/L — ABNORMAL HIGH (ref 135–145)

## 2018-05-22 LAB — BLOOD GAS, ARTERIAL
Acid-Base Excess: 3.4 mmol/L — ABNORMAL HIGH (ref 0.0–2.0)
Bicarbonate: 27.1 mmol/L (ref 20.0–28.0)
Drawn by: 270111
O2 Content: 3.5 L/min
O2 Saturation: 94.9 %
PATIENT TEMPERATURE: 98.6
pCO2 arterial: 38.6 mmHg (ref 32.0–48.0)
pH, Arterial: 7.46 — ABNORMAL HIGH (ref 7.350–7.450)
pO2, Arterial: 68.8 mmHg — ABNORMAL LOW (ref 83.0–108.0)

## 2018-05-22 LAB — CBC
HCT: 30.8 % — ABNORMAL LOW (ref 39.0–52.0)
Hemoglobin: 9 g/dL — ABNORMAL LOW (ref 13.0–17.0)
MCH: 28 pg (ref 26.0–34.0)
MCHC: 29.2 g/dL — ABNORMAL LOW (ref 30.0–36.0)
MCV: 95.7 fL (ref 80.0–100.0)
PLATELETS: 142 10*3/uL — AB (ref 150–400)
RBC: 3.22 MIL/uL — ABNORMAL LOW (ref 4.22–5.81)
RDW: 19.4 % — ABNORMAL HIGH (ref 11.5–15.5)
WBC: 9.4 10*3/uL (ref 4.0–10.5)
nRBC: 0 % (ref 0.0–0.2)

## 2018-05-22 LAB — GLUCOSE, CAPILLARY
Glucose-Capillary: 132 mg/dL — ABNORMAL HIGH (ref 70–99)
Glucose-Capillary: 186 mg/dL — ABNORMAL HIGH (ref 70–99)
Glucose-Capillary: 224 mg/dL — ABNORMAL HIGH (ref 70–99)
Glucose-Capillary: 145 mg/dL — ABNORMAL HIGH (ref 70–99)

## 2018-05-22 MED ORDER — IOHEXOL 300 MG/ML  SOLN
75.0000 mL | Freq: Once | INTRAMUSCULAR | Status: AC | PRN
  Administered 2018-05-22: 75 mL via INTRAVENOUS

## 2018-05-22 MED ORDER — FUROSEMIDE 10 MG/ML IJ SOLN
40.0000 mg | Freq: Once | INTRAMUSCULAR | Status: AC
  Administered 2018-05-22: 40 mg via INTRAVENOUS
  Filled 2018-05-22: qty 4

## 2018-05-22 MED ORDER — PIPERACILLIN-TAZOBACTAM 3.375 G IVPB
3.3750 g | Freq: Three times a day (TID) | INTRAVENOUS | Status: DC
  Administered 2018-05-22 – 2018-05-26 (×11): 3.375 g via INTRAVENOUS
  Filled 2018-05-22 (×14): qty 50

## 2018-05-22 NOTE — Progress Notes (Signed)
Pharmacy Antibiotic Note  Jose [name] is a 85 y.o. male admitted from SNF on 05/11/2018 with recurrent aspiration pneumonia.   Pharmacy has been consulted for Zosyn dosing for aspiration pneumonia.  Plan: Zosyn 3.375 g IV q8h (4 hour infusion) Monitor clinical status, renal function and culture results daily.    Height: 6\' 1"  (185.4 cm) Weight: 169 lb 12.1 oz (77 kg) IBW/kg (Calculated) : 79.9  Temp (24hrs), Avg:98.5 F (36.9 C), Min:98.4 F (36.9 C), Max:98.5 F (36.9 C)  Recent Labs  Lab 05/17/18 0249 05/18/18 0314 05/19/18 0306 05/20/18 0255 05/21/18 0338 05/22/18 1129  WBC 9.6 8.7 8.2 8.8 7.4 9.4  CREATININE 1.06 1.00 0.97 0.99 0.92  --     Estimated Creatinine Clearance: 63.9 mL/min (by C-G formula based on SCr of 0.92 mg/dL).    Allergies  Allergen Reactions  . Axona [Bacid] Other (See Comments)    Unknown per MAR  . Gabapentin Other (See Comments)    Hallucinations    Antimicrobials this admission: Unasyn 2/9>>2/16 Zosyn 2/20>>  2/9 Blood x 2 - neg 2/9 sputum cx: sent? 2/9 Strep Ag - not collected yet 2/11 2/9 HIV non-reactive  Dose adjustments this admission:   Microbiology results: 2/9 Blood x 2 - neg 2/9 sputum cx: sent? 2/9 Strep Ag - not collected yet 2/11 2/9 HIV non-reactive   Thank you for allowing pharmacy to be a part of this patient's care. Nicole Cella, RPh Clinical Pharmacist [phone] Please check AMION for all [facility] phone numbers After 10:00 PM, call [place] [phone] 05/22/2018 11:52 AM

## 2018-05-22 NOTE — Progress Notes (Signed)
Patient ID: ANDONI BUSCH, male   DOB: 1930/05/28, 85 y.o.   MRN: [id number]  This NP visited patient at the bedside as a follow up for palliative medicine needs and emotional support.  Daughter, and caregiver and wife  at bedside.  Patient appear is alert and follows simple commands. He recognizes family at bedside.  Unfortunately patient required BiPAP last night.  Family verbalize their appreciation of Dr. Reggy Eye attention and thoughtful treatment plan.  I offered emotional support and verbalized an understanding of how difficult this situation and related decisions can be.  Questions and concerns addressed. I did reiterate the natural trajectory of end-stage dementia, his high risk of aspiration and related pneumonias, high risk for decompensation and overall failure to thrive.   I continue to try to help this family understand that even though the patient  is at his cognitive baseline he continues to decline both physically and functionally.  Discussed that his inability to maintain oxygenation at night when he is tired and begins to sleep may simply be related to significant weakness and again failure to thrive.   We again discussed the limitations of medical interventions to provide ongoing quality of life when the body begins to fail to thrive.  Discussed with family the importance of continued conversation with each other and the medical providers regarding overall plan of care and treatment options,  ensuring decisions are within the context of the patients values and GOCs.  Questions and concerns addressed   Discussed with Dr Wynelle Cleveland  Total time spent on the unit was 35 minutes  PMT continue to support holistically  I let the family know that this nurse practitioner would not be back into the hospital until Monday and that they should call the team phone with any questions or concerns.  I will follow-up with them on Monday  Greater than 50% of the time was spent in counseling and  coordination of care  Wadie Lessen NP  Palliative Medicine Team Team Phone # [phone] Pager [phone]

## 2018-05-22 NOTE — Progress Notes (Signed)
PROGRESS NOTE    Jose [name]   GHW:[id number]  DOB: 1931/03/28  DOA: 05/11/2018 PCP: [name], MD   Brief Narrative:  [name] y.o.malewith a history of advanced dementia who is bedbound with T2DM, chronic combined CHF, HTN, and current episodes of aspiration pneumonia who presented to the ED from SNF on 2/9 with shortness of breath after breakfast, possibly due to recurrent aspiration pneumonia.  He had been hospitalized for RSV, pneumonia, and UTI treatment, discharged 1/29, returned to the ED 3 hours later with suspected recurrent aspiration.  During this hospitalization, while undergoing treatment for pneumonia, he was found to have an acute drop in hemoglobin to 5.5. GI was consulted, recommended EGD. 2u PRBCs transfused with lasix with appropriate rise in hgb to 8.5. Unfortunately he developed respiratory distress due to pulmonary edema requiring BiPAP on 2/12. Due to tenuous respiratory status and high risk of intubation, EGD was no longer pursued. PCCM also consulted as well as Palliative care medicine.   This is his third hospitalization in the past 2 months.  He returned on 2/9 for shortness of breath once again thought to be secondary to aspiration.  At baseline he is on 4 L of oxygen.  Subjective: He has no complaints today.    Assessment & Plan:   Principal Problem:   Aspiration pneumonia- dysphagia- acute on chronic respiratory failure -due to issues with recurrent aspiration, a palliative care consult has been requested -At this time, the patient's family does not want to pursue comfort care however he is DO NOT RESUSCITATE - Continue dysphagia 1 diet with nectar thick liquids -Completed a course of antibiotics -The patient is currently requiring 6 L of high flow nasal cannula -Last chest x-ray obtained on 2/15 revealed bilateral lower lobe opacities - his pulse ox continues to drop to low 80s at night- I have obtained a CT today to assess further and  he is found to have multifocal infiltrates which could represent pneumonia or pulmonary edema- I will order Lasix and Zosyn to treat this and we will follow O2 levels   Active Problems: Acute blood loss anemia with suspected upper GI bleed The patient is a high risk for EGD and therefore currently no further investigation is planned Continue twice daily PPI and continue to follow hemoglobin intermittently -Continue oral iron    Alzheimer disease  -Continue Namenda and Aricept    Type 2 diabetes mellitus with diabetic chronic kidney disease -Continue sliding scale insulin- sugars not very elevated - Lantus, Metformin and Januvia are on hold     Chronic combined systolic and diastolic congestive heart failure  -Currently compensated  Depression/anxiety - Continue Wellbutrin    Time spent in minutes: 35 minutes DVT prophylaxis: SCDs Code Status: DO NOT RESUSCITATE Family Communication: daughter Disposition Plan: Return to skilled nursing facility when O2 levels stabalize Consultants:   Palliative care  GI  Pulmonary critical care Procedures:   None Antimicrobials:  Anti-infectives (From admission, onward)   Start     Dose/Rate Route Frequency Ordered Stop   05/12/18 0300  Ampicillin-Sulbactam (UNASYN) 3 g in sodium chloride 0.9 % 100 mL IVPB     3 g 200 mL/hr over 30 Minutes Intravenous Every 6 hours 05/11/18 2015 05/18/18 2054   05/11/18 1930  Ampicillin-Sulbactam (UNASYN) 3 g in sodium chloride 0.9 % 100 mL IVPB     3 g 200 mL/hr over 30 Minutes Intravenous  Once 05/11/18 1926 05/11/18 2125  Objective: Vitals:   05/22/18 0331 05/22/18 0604 05/22/18 0645 05/22/18 0700  BP: 105/66   105/74  Pulse: 84   (!) 115  Resp: 20 (!) 22 (!) 23 (!) 31  Temp: 98.4 F (36.9 C)   98.5 F (36.9 C)  TempSrc: Oral   Oral  SpO2: 100% 100% 93% 93%  Weight:      Height:        Intake/Output Summary (Last 24 hours) at 05/22/2018 1045 Last data filed at 05/22/2018  0600 Gross per 24 hour  Intake -  Output 1450 ml  Net -1450 ml   Filed Weights   05/12/18 2100  Weight: 77 kg    Examination: General exam: Appears comfortable  HEENT: PERRLA, oral mucosa moist, no sclera icterus or thrush Respiratory system: Clear to auscultation. Respiratory effort normal. Cardiovascular system: S1 & S2 heard,  No murmurs  Gastrointestinal system: Abdomen soft, non-tender, nondistended. Normal bowel sound. No organomegaly Central nervous system: Alert - moves arms Extremities: No cyanosis, clubbing or edema Skin: No rashes or ulcers Psychiatry:  cannot assess    Data Reviewed: I have personally reviewed following labs and imaging studies  CBC: Recent Labs  Lab 05/17/18 0249 05/18/18 0314 05/19/18 0306 05/20/18 0255 05/21/18 0338  WBC 9.6 8.7 8.2 8.8 7.4  HGB 8.4* 8.7* 8.2* 8.7* 8.1*  HCT 27.4* 30.2* 28.7* 30.1* 28.5*  MCV 92.3 93.8 94.1 96.2 95.6  PLT 150 151 147* 155 703*   Basic Metabolic Panel: Recent Labs  Lab 05/17/18 0249 05/18/18 0314 05/19/18 0306 05/20/18 0255 05/21/18 0338  NA 148* 147* 150* 151* 148*  K 3.5 3.2* 3.7 3.7 3.5  CL 113* 115* 117* 117* 117*  CO2 26 29 28 28 26   GLUCOSE 137* 117* 152* 151* 157*  BUN 16 16 20 22 19   CREATININE 1.06 1.00 0.97 0.99 0.92  CALCIUM 8.1* 8.1* 8.0* 8.3* 8.0*  MG  --   --  2.1  --   --    GFR: Estimated Creatinine Clearance: 63.9 mL/min (by C-G formula based on SCr of 0.92 mg/dL). Liver Function Tests: No results for input(s): AST, ALT, ALKPHOS, BILITOT, PROT, ALBUMIN in the last 168 hours. No results for input(s): LIPASE, AMYLASE in the last 168 hours. No results for input(s): AMMONIA in the last 168 hours. Coagulation Profile: No results for input(s): INR, PROTIME in the last 168 hours. Cardiac Enzymes: No results for input(s): CKTOTAL, CKMB, CKMBINDEX, TROPONINI in the last 168 hours. BNP (last 3 results) No results for input(s): PROBNP in the last 8760 hours. HbA1C: No results  for input(s): HGBA1C in the last 72 hours. CBG: Recent Labs  Lab 05/21/18 0759 05/21/18 1156 05/21/18 1700 05/21/18 2242 05/22/18 0747  GLUCAP 140* 258* 201* 168* 132*   Lipid Profile: No results for input(s): CHOL, HDL, LDLCALC, TRIG, CHOLHDL, LDLDIRECT in the last 72 hours. Thyroid Function Tests: No results for input(s): TSH, T4TOTAL, FREET4, T3FREE, THYROIDAB in the last 72 hours. Anemia Panel: No results for input(s): VITAMINB12, FOLATE, FERRITIN, TIBC, IRON, RETICCTPCT in the last 72 hours. Urine analysis:    Component Value Date/Time   COLORURINE YELLOW 05/01/2018 1109   [place] 05/01/2018 1109   LABSPEC 1.011 05/01/2018 1109   PHURINE 6.0 05/01/2018 1109   GLUCOSEU NEGATIVE 05/01/2018 1109   [place] (A) 05/01/2018 1109   BILIRUBINUR NEGATIVE 05/01/2018 1109   [place] 05/01/2018 1109   PROTEINUR NEGATIVE 05/01/2018 1109   UROBILINOGEN 0.2 06/24/2014 2112   NITRITE NEGATIVE 05/01/2018 1109  LEUKOCYTESUR NEGATIVE 05/01/2018 1109   Sepsis Labs: @LABRCNTIP (procalcitonin:4,lacticidven:4) ) No results found for this or any previous visit (from the past 240 hour(s)).       Radiology Studies: Ct Chest W Contrast  Result Date: 05/22/2018 CLINICAL DATA:  SOB per pt chart; Pt is totally non-verbal, denies chest pain EXAM: CT CHEST WITH CONTRAST TECHNIQUE: Multidetector CT imaging of the chest was performed during intravenous contrast administration. CONTRAST:  5mL OMNIPAQUE IOHEXOL 300 MG/ML  SOLN COMPARISON:  11/28/2016 FINDINGS: Cardiovascular: Heart size upper limits normal. The RV is nondilated. No pleural effusion. Coronary and aortic calcifications without aneurysm. Mediastinum/Nodes: No pathologically enlarged mediastinal lymph nodes. Calcified bilateral hilar lymph nodes. Lungs/Pleura: Small pleural effusions right greater than left. No pneumothorax. Patchy airspace opacities in the right upper lobe primarily apical and posterior segments,  to a lesser degree in the inferomedial lingula and posterior left upper lobe. Dependent atelectasis posteriorly in both lower lobes. Upper Abdomen: No acute findings. Moderate gastric distention. Musculoskeletal: Anterior vertebral endplate spurring at multiple levels in the mid and lower thoracic spine. Spondylitic changes in the lower cervical spine. IMPRESSION: 1. Patchy airspace opacities in the right upper lobe, lingula, and posterior left upper lobe may represent pneumonia or atypical pulmonary edema. 2. Small pleural effusions right greater than left. 3. Coronary and Aortic Atherosclerosis (ICD10-I70.0). Electronically Signed   By: Lucrezia Europe M.D.   On: 05/22/2018 10:37   Dg Chest Port 1 View  Result Date: 05/21/2018 CLINICAL DATA:  85 y/o  M; shortness of breath and crackles. EXAM: PORTABLE CHEST 1 VIEW COMPARISON:  05/17/2018 chest radiograph FINDINGS: Normal cardiac silhouette. Aortic atherosclerosis with calcification. Diffusely increased reticular and patchy airspace opacities greatest in the right lung. No pleural effusion or pneumothorax. No acute osseous abnormality is evident. IMPRESSION: Diffusely increased reticular and patchy airspace opacities greatest in the right lung probably represents interstitial and alveolar pulmonary edema. Underlying pneumonia is not excluded. Electronically Signed   By: Kristine Garbe M.D.   On: 05/21/2018 22:39      Scheduled Meds: . buPROPion  100 mg Oral Daily  . docusate sodium  100 mg Oral Daily  . donepezil  23 mg Oral Daily  . dorzolamide-timolol  1 drop Left Eye BID  . ferrous sulfate  325 mg Oral Q breakfast  . free water  200 mL Oral Q8H  . furosemide  40 mg Intravenous Once  . insulin aspart  0-9 Units Subcutaneous TID WC  . memantine  28 mg Oral Daily  . pantoprazole  40 mg Oral Daily  . simvastatin  5 mg Oral q1800   Continuous Infusions:    LOS: 11 days      Debbe Odea, MD [facility] Pager: [email or web address] Password [email or web address] 05/22/2018, 10:45 AM

## 2018-05-23 DIAGNOSIS — K922 Gastrointestinal hemorrhage, unspecified: Secondary | ICD-10-CM

## 2018-05-23 LAB — GLUCOSE, CAPILLARY
Glucose-Capillary: 132 mg/dL — ABNORMAL HIGH (ref 70–99)
Glucose-Capillary: 216 mg/dL — ABNORMAL HIGH (ref 70–99)
Glucose-Capillary: 240 mg/dL — ABNORMAL HIGH (ref 70–99)
Glucose-Capillary: 162 mg/dL — ABNORMAL HIGH (ref 70–99)

## 2018-05-23 LAB — BASIC METABOLIC PANEL
Anion gap: 9 (ref 5–15)
BUN: 19 mg/dL (ref 8–23)
CO2: 29 mmol/L (ref 22–32)
Calcium: 8.4 mg/dL — ABNORMAL LOW (ref 8.9–10.3)
Chloride: 110 mmol/L (ref 98–111)
Creatinine, Ser: 1.01 mg/dL (ref 0.61–1.24)
GFR calc Af Amer: >60 mL/min (ref >60)
Glucose, Bld: 157 mg/dL — ABNORMAL HIGH (ref 70–99)
Potassium: 3.1 mmol/L — ABNORMAL LOW (ref 3.5–5.1)
SODIUM: 148 mmol/L — AB (ref 135–145)

## 2018-05-23 MED ORDER — AMOXICILLIN-POT CLAVULANATE 875-125 MG PO TABS: 1.0000 | ORAL_TABLET | Freq: Two times a day (BID) | ORAL | 0 refills | Status: DC

## 2018-05-23 MED ORDER — PANTOPRAZOLE SODIUM 40 MG PO TBEC: 40.0000 mg | DELAYED_RELEASE_TABLET | Freq: Every day | ORAL | Status: DC

## 2018-05-23 MED ORDER — FUROSEMIDE 10 MG/ML IJ SOLN
20.0000 mg | Freq: Every day | INTRAMUSCULAR | Status: DC
  Administered 2018-05-23 – 2018-05-26 (×4): 20 mg via INTRAVENOUS
  Filled 2018-05-23 (×4): qty 2

## 2018-05-23 MED ORDER — ALBUTEROL SULFATE (2.5 MG/3ML) 0.083% IN NEBU: 2.5000 mg | INHALATION_SOLUTION | RESPIRATORY_TRACT | 12 refills | Status: AC | PRN

## 2018-05-23 MED ORDER — POTASSIUM CHLORIDE CRYS ER 20 MEQ PO TBCR
40.0000 meq | EXTENDED_RELEASE_TABLET | ORAL | Status: AC
  Administered 2018-05-23 (×2): 40 meq via ORAL
  Filled 2018-05-23 (×2): qty 2

## 2018-05-23 MED ORDER — FREE WATER: 200.0000 mL | Freq: Three times a day (TID) | Status: DC

## 2018-05-23 NOTE — Progress Notes (Signed)
Pt order is for BP PRN. Pt stable and not in need of NIV at this time. RT will continue to monitor.

## 2018-05-23 NOTE — Discharge Summary (Addendum)
Physician Discharge Summary  Jose [name] VEH:[id number] DOB: [birth year] DOA: 05/11/2018  PCP: [name], MD  Admit date: 05/11/2018 Discharge date: 05/23/2018  Admitted From: SNF Disposition:  SNF   Recommendations for Outpatient Follow-up:  1. F/u pulse ox daily 2. CBC every 2 wks please 3. Lantus currently on hold- utilize Sliding scale insulin only for now    Discharge Condition:  stable   CODE STATUS:  DNR   Diet recommendation:  Low sodium diet, D1 nectar thick liquids Consultations:  Palliative care  GI   Pulmonary critical care   Discharge Diagnoses:  Principal Problem:   Aspiration pneumonia  - Acute hypoxemic respiratory failure ([place]) Active Problems:   Alzheimer disease ([place])   Type 2 diabetes mellitus with diabetic chronic kidney disease (HCC)   Chronic combined systolic and diastolic congestive heart failure (HCC)   Dysphagia   HTN (hypertension)   DNR (do not resuscitate) discussion   Eye irritation   Recurrent pneumonia   Goals of care, counseling/discussion   Palliative care encounter   Upper GI bleed   Acute respiratory failure ([place])   Adult failure to thrive      Brief Summary: Jose [name] y.o.malewith a history of advanced dementia who is bedbound with T2DM, chronic combined CHF, HTN, and recurrent episodes of aspiration pneumonia who presented to the ED from SNF on 2/9 with shortness of breath after breakfast, possibly due to recurrent aspiration.  He had been hospitalized for RSV, pneumonia, and UTI treatment, discharged 1/29, returned to the ED 3 hours later with suspected aspiration.  During this hospitalization, while undergoing treatment for pneumonia, he was found to have an acute drop in hemoglobin to 5.5. GI was consulted, recommended to have an EGD. 2u PRBCs transfused with lasix with appropriate rise in hgb to 8.5. Unfortunately he developed respiratory distress due to pulmonary edema requiring BiPAP on 2/12. Due to tenuous  respiratory status and high risk of intubation, EGD was no longer pursued. PCCM also consulted as well as Palliative care medicine.  This is his third hospitalization in the past 2 months.  He returned on 2/9 for shortness of breath once again thought to be secondary to aspiration.  At baseline he is on 4 L of oxygen.  Hospital Course:  Principal Problem:   Aspiration pneumonia- dysphagia- acute on chronic respiratory failure -due to issues with recurrent aspiration, a palliative care consult has been requested -At this time, the patient's family does not want to pursue comfort care however he is DO NOT RESUSCITATE - Continue dysphagia 1 diet with nectar thick liquids -Completed a course of antibiotics for aspiration pneumonia -Last chest x-ray obtained on 2/15 revealed bilateral lower lobe opacities however, as he was not able to be weaned down from 6 L of O2, I obtained a CT of the chest - this revealed bilateral patchy infiltrates consistent with pneumonia vs CHF- I gave him a dose of IV Lasix and resumed Zosyn - pulse ox has improved and he has been weaned down to 2 L- He is no longer needing PRN BiPAP and is stable to d/c back to SNF - recommend 5 more days of Augmentin and continue his daily Lasix   Active Problems: Acute blood loss anemia with suspected upper GI bleed The patient is a high risk for EGD and therefore currently no further investigation is planned Continue daily PPI and continue to follow hemoglobin intermittently -Continue oral iron    Alzheimer disease  -Continue Namenda and Aricept  Type 2 diabetes mellitus with diabetic chronic kidney disease -Continue sliding scale insulin- sugars not very elevated - Lantus, Metformin and Januvia were on hold in the hospital    Chronic combined systolic and diastolic congestive heart failure  -Currently compensated  Depression/anxiety - Continue Wellbutrin    Discharge Exam: Vitals:   05/23/18 0753 05/23/18  0800  BP: 138/89   Pulse:    Resp:    Temp:  97.9 F (36.6 C)  SpO2:     Vitals:   05/22/18 2340 05/23/18 0615 05/23/18 0753 05/23/18 0800  BP: (!) 141/94 (!) 151/71 138/89   Pulse: 78     Resp: (!) 27 (!) 34    Temp: (!) 97.4 F (36.3 C)   97.9 F (36.6 C)  TempSrc: Oral     SpO2: 95% 95%    Weight:      Height:        General: Pt is alert, awake, not in acute distress- frail appearing- mostly non-verbal-  Cardiovascular: RRR, S1/S2 +, no rubs, no gallops Respiratory: CTA bilaterally, no wheezing, no rhonchi Abdominal: Soft, NT, ND, bowel sounds + Extremities: no edema, no cyanosis   Discharge Instructions  Discharge Instructions    Increase activity slowly   Complete by:  As directed      Allergies as of 05/23/2018      Reactions   Axona [bacid] Other (See Comments)   Unknown per MAR   Gabapentin Other (See Comments)   Hallucinations      Medication List    STOP taking these medications   insulin glargine 100 UNIT/ML injection Commonly known as:  LANTUS   lisinopril 5 MG tablet Commonly known as:  PRINIVIL,ZESTRIL     TAKE these medications   albuterol (2.5 MG/3ML) 0.083% nebulizer solution Commonly known as:  PROVENTIL Take 3 mLs (2.5 mg total) by nebulization every 4 (four) hours as needed for wheezing or shortness of breath.   ALIGN 4 MG Caps Take 4 mg by mouth daily.   amoxicillin-clavulanate 875-125 MG tablet Commonly known as:  AUGMENTIN Take 1 tablet by mouth 2 (two) times daily.   buPROPion 100 MG 12 hr tablet Commonly known as:  WELLBUTRIN SR Take 100 mg by mouth daily.   carvedilol 3.125 MG tablet Commonly known as:  COREG Take 1 tablet (3.125 mg total) by mouth 2 (two) times daily with a meal.   docusate sodium 100 MG capsule Commonly known as:  COLACE Take 100 mg by mouth daily.   donepezil 23 MG Tabs tablet Commonly known as:  ARICEPT Take 1 tablet (23 mg total) by mouth daily.   dorzolamide-timolol 22.3-6.8 MG/ML  ophthalmic solution Commonly known as:  COSOPT Place 1 drop into the left eye 2 (two) times daily.   ferrous sulfate 325 (65 FE) MG tablet Take 325 mg by mouth daily with breakfast.   Flaxseed Oil 1000 MG Caps Take 1,000 mg by mouth daily.   folic acid 161 MCG tablet Commonly known as:  FOLVITE Take 800 mcg by mouth every evening.   free water Soln Take 200 mLs by mouth every 8 (eight) hours.   furosemide 20 MG tablet Commonly known as:  LASIX Take 1 tablet (20 mg total) by mouth daily.   memantine 28 MG Cp24 24 hr capsule Commonly known as:  NAMENDA XR Take 1 capsule (28 mg total) by mouth daily.   metFORMIN 500 MG tablet Commonly known as:  GLUCOPHAGE Take 500 mg by mouth 2 (two) times daily  with a meal.   multivitamin tablet Take 1 tablet by mouth daily.   pantoprazole 40 MG tablet Commonly known as:  PROTONIX Take 1 tablet (40 mg total) by mouth daily.   simvastatin 5 MG tablet Commonly known as:  ZOCOR Take 5 mg by mouth daily.   sitaGLIPtin 25 MG tablet Commonly known as:  JANUVIA Take 1 tablet (25 mg total) by mouth daily.   Vitamin D 1000 units capsule Take 1,000 Units by mouth daily.       Allergies  Allergen Reactions  . Axona [Bacid] Other (See Comments)    Unknown per MAR  . Gabapentin Other (See Comments)    Hallucinations     Procedures/Studies:    Dg Chest 2 View  Result Date: 05/11/2018 CLINICAL DATA:  Possible pneumonia EXAM: CHEST - 2 VIEW COMPARISON:  Chest radiograph 05/01/2018 FINDINGS: Monitoring leads overlie the patient. Stable cardiac and mediastinal contours. Low lung volumes. Bibasilar heterogeneous pulmonary opacities. No pleural effusion or pneumothorax. Thoracic spine degenerative changes. Elevation right hemidiaphragm. IMPRESSION: Right-greater-than-left basilar opacities favored to represent atelectasis. Electronically Signed   By: Lovey Newcomer M.D.   On: 05/11/2018 17:15   Ct Chest W Contrast  Result Date:  05/22/2018 CLINICAL DATA:  SOB per pt chart; Pt is totally non-verbal, denies chest pain EXAM: CT CHEST WITH CONTRAST TECHNIQUE: Multidetector CT imaging of the chest was performed during intravenous contrast administration. CONTRAST:  62mL OMNIPAQUE IOHEXOL 300 MG/ML  SOLN COMPARISON:  11/28/2016 FINDINGS: Cardiovascular: Heart size upper limits normal. The RV is nondilated. No pleural effusion. Coronary and aortic calcifications without aneurysm. Mediastinum/Nodes: No pathologically enlarged mediastinal lymph nodes. Calcified bilateral hilar lymph nodes. Lungs/Pleura: Small pleural effusions right greater than left. No pneumothorax. Patchy airspace opacities in the right upper lobe primarily apical and posterior segments, to a lesser degree in the inferomedial lingula and posterior left upper lobe. Dependent atelectasis posteriorly in both lower lobes. Upper Abdomen: No acute findings. Moderate gastric distention. Musculoskeletal: Anterior vertebral endplate spurring at multiple levels in the mid and lower thoracic spine. Spondylitic changes in the lower cervical spine. IMPRESSION: 1. Patchy airspace opacities in the right upper lobe, lingula, and posterior left upper lobe may represent pneumonia or atypical pulmonary edema. 2. Small pleural effusions right greater than left. 3. Coronary and Aortic Atherosclerosis (ICD10-I70.0). Electronically Signed   By: Lucrezia Europe M.D.   On: 05/22/2018 10:37   Dg Chest Port 1 View  Result Date: 05/21/2018 CLINICAL DATA:  85 y/o  M; shortness of breath and crackles. EXAM: PORTABLE CHEST 1 VIEW COMPARISON:  05/17/2018 chest radiograph FINDINGS: Normal cardiac silhouette. Aortic atherosclerosis with calcification. Diffusely increased reticular and patchy airspace opacities greatest in the right lung. No pleural effusion or pneumothorax. No acute osseous abnormality is evident. IMPRESSION: Diffusely increased reticular and patchy airspace opacities greatest in the right lung  probably represents interstitial and alveolar pulmonary edema. Underlying pneumonia is not excluded. Electronically Signed   By: Kristine Garbe M.D.   On: 05/21/2018 22:39   Dg Chest Port 1 View  Result Date: 05/17/2018 CLINICAL DATA:  Acute hypoxemic respiratory failure. History of congestive heart failure and diabetes. EXAM: PORTABLE CHEST 1 VIEW COMPARISON:  Radiographs 05/15/2018 and 05/14/2018. FINDINGS: 0801 hours. Two views submitted. The heart size and mediastinal contours are stable. There are fluctuating bilateral airspace opacities with current components in both lower lobes and the right upper lobe, similar to the most recent study. Pulmonary vascularity appears normal. There are probable small bilateral pleural effusions. No  pneumothorax or acute osseous findings. IMPRESSION: Unchanged bilateral airspace opacities compared with most recent study of 2 days ago. These findings likely represent a combination of atelectasis and inflammation. No definite residual edema. Electronically Signed   By: Richardean Sale M.D.   On: 05/17/2018 10:08   Dg Chest Port 1 View  Result Date: 05/15/2018 CLINICAL DATA:  Initial evaluation for acute increase in shortness of breath. EXAM: PORTABLE CHEST 1 VIEW COMPARISON:  Prior radiograph from earlier the same day. FINDINGS: Stable cardiomegaly.  Mediastinal silhouette within normal limits. Lungs hypoinflated with elevation of right hemidiaphragm, similar. Dense retrocardiac left lower opacity most compatible with atelectasis. Increased confluent opacity at the inferior right upper lobe, which could reflect atelectasis and/or worsening/new infiltrate. Mildly increased right basilar atelectasis. Underlying diffuse pulmonary interstitial edema with scattered Kerley B-lines, slightly worsened. Small bilateral pleural effusions. No pneumothorax. Osseous structures unchanged. IMPRESSION: 1. Slight interval worsening in mild diffuse pulmonary interstitial edema.  Small bilateral pleural effusions. 2. Increased confluent right perihilar opacity, which could reflect progressive atelectasis and/or infiltrate. 3. Persistent bibasilar atelectasis. Electronically Signed   By: Jeannine Boga M.D.   On: 05/15/2018 23:40   Dg Chest Port 1 View  Result Date: 05/15/2018 CLINICAL DATA:  Acute respiratory failure EXAM: PORTABLE CHEST 1 VIEW COMPARISON:  05/14/2018 FINDINGS: Cardiomegaly. Improving bilateral opacities. Continued mild residual right perihilar opacities and bibasilar atelectasis. Suspect small layering effusions. No acute bony abnormality. IMPRESSION: Improving bilateral airspace disease. Continued right perihilar opacities and bibasilar atelectasis. Question small effusions. Electronically Signed   By: Rolm Baptise M.D.   On: 05/15/2018 03:32   Dg Chest Port 1 View  Result Date: 05/14/2018 CLINICAL DATA:  Acute respiratory failure with shortness of breath EXAM: PORTABLE CHEST 1 VIEW COMPARISON:  Yesterday FINDINGS: Worsening interstitial opacity with Kerley lines. Low volume chest with increased hazy and streaky density on the right. Borderline heart size. Negative aortic and mediastinal contours. IMPRESSION: 1. Pulmonary edema. 2. Increased asymmetric right lung opacity that could be atelectasis or infection. Lung volumes are low. Electronically Signed   By: Monte Fantasia M.D.   On: 05/14/2018 04:41   Dg Chest Port 1 View  Result Date: 05/13/2018 CLINICAL DATA:  Pneumonia. EXAM: PORTABLE CHEST 1 VIEW COMPARISON:  Radiograph May 11, 2018. FINDINGS: Stable cardiomegaly. Atherosclerosis of thoracic aorta is noted. Stable elevated right hemidiaphragm is noted. Mild bibasilar subsegmental atelectasis is noted. No pneumothorax or pleural effusion is noted. Bony thorax is unremarkable. IMPRESSION: Mild bibasilar subsegmental atelectasis. Aortic Atherosclerosis (ICD10-I70.0). Electronically Signed   By: Marijo Conception, M.D.   On: 05/13/2018 08:51   Dg  Chest Portable 1 View  Result Date: 05/01/2018 CLINICAL DATA:  Shortness of breath. Recent left lower lobe pneumonia. EXAM: PORTABLE CHEST 1 VIEW COMPARISON:  Radiographs 3 days ago 04/28/2018, additional priors. FINDINGS: No significant change in left lung base opacity from prior exam. Unchanged right perihilar atelectasis. Unchanged heart size and mediastinal contours. Unchanged low lung volumes. Mild basilar septal thickening and peribronchial thickening suspicious for mild pulmonary edema. No pneumothorax. IMPRESSION: 1. No significant change in left lung base opacity from prior exam. Unchanged right perihilar atelectasis. 2. Mild basilar septal and peribronchial thickening, suspicious for pulmonary edema. Electronically Signed   By: Keith Rake M.D.   On: 05/01/2018 00:52   Dg Chest Port 1 View  Result Date: 04/28/2018 CLINICAL DATA:  Respiratory failure. EXAM: PORTABLE CHEST 1 VIEW COMPARISON:  04/25/2018 FINDINGS: The cardiomediastinal silhouette is unchanged with upper limits of normal heart  size. There is persistent elevation of the right hemidiaphragm with overall low lung volumes. Retrocardiac opacity in the left lower lobe has decreased from the prior study and likely represents atelectasis. The right lung remains grossly clear. No sizable pleural effusion or pneumothorax is identified. IMPRESSION: Improved left lower lobe aeration. Electronically Signed   By: Logan Bores M.D.   On: 04/28/2018 07:18   Dg Chest Portable 1 View  Result Date: 04/25/2018 CLINICAL DATA:  Worsening productive cough EXAM: PORTABLE CHEST 1 VIEW COMPARISON:  03/30/2017 FINDINGS: Lung volumes are low. Streaky parenchymal opacities are noted in the retrocardiac portion of the chest. Findings or more likely to represent atelectasis. Pneumonia would be difficult to entirely exclude. Cardiac silhouette is stable and borderline enlarged. There is mild aortic atherosclerosis at the arch. Chronic elevation of the right  hemidiaphragm with bibasilar atelectasis is noted. No overt pulmonary edema or pulmonary consolidations are identified. Obscuration of the left lateral costophrenic angle is nonspecific but may represent a small effusion or pleural thickening. IMPRESSION: Streaky opacities in the retrocardiac portion of the chest. Findings are more likely to represent atelectasis. Pneumonia would be difficult to entirely exclude. Slight obscuration of the left lateral costophrenic angle is noted, nonspecific but may represent the presence of a small left effusion or possibly pleural thickening. Low lung volumes without overt pulmonary edema. Electronically Signed   By: Ashley Royalty M.D.   On: 04/25/2018 19:07   Dg Swallowing Func-speech Pathology  Result Date: 04/29/2018 Objective Swallowing Evaluation: Type of Study: MBS-Modified Barium Swallow Study  Patient Details Name: KAZUO DURNIL MRN: [id number] Date of Birth: [birth year] Today's Date: 04/29/2018 Time: SLP Start Time (ACUTE ONLY): 1210 -SLP Stop Time (ACUTE ONLY): 1230 SLP Time Calculation (min) (ACUTE ONLY): 20 min Past Medical History: Past Medical History: Diagnosis Date . Abnormality of gait  . Backache, unspecified  . CHF (congestive heart failure) ([place])  . Colon polyps   adenomatous . Dementia ([place]) 11/12/2012 . Depression  . Diabetes mellitus without complication ([place])  . Diverticulosis  . Essential and other specified forms of tremor  . Hemorrhoids  . History of bilateral hip replacements 11/12/2012 . Memory loss  . Other persistent mental disorders due to conditions classified elsewhere  . Pain in joint, pelvic region and thigh  . Skin cancer of scalp  . Spinal stenosis, lumbar region, without neurogenic claudication  . Unspecified hereditary and idiopathic peripheral neuropathy  Past Surgical History: Past Surgical History: Procedure Laterality Date . CATARACT EXTRACTION, BILATERAL  2012 . JOINT REPLACEMENT   . RETINAL LASER PROCEDURE  2012  retinal wrinkle . SKIN  CANCER EXCISION   . TOTAL HIP ARTHROPLASTY Left 2000 . TOTAL HIP ARTHROPLASTY (aka REPLACEMENT) Right 5944 HPI: 85 year old man with history of CHF, dementia, DM, HTN, iron deficiency anemia presenting with altered mental status. Unable to obtain history from patient due to acute encephalopathy and underlying dementia. Marland Kitchen He has been having respiratory infection symptoms for the past 1.5 to 2 weeks, including cough that sounds productive. Patient had MBS 03/2017 with mild oropharyngeal dysphagia and discharged with Dys 1, thin liquid diet.  Subjective: alert, cooperative Assessment / Plan / Recommendation CHL IP CLINICAL IMPRESSIONS 04/29/2018 Clinical Impression Patient has mild oropharyngeal dysphagia. Thin liquids with cup sip were aspirated silently when bolus presented by cup, by examiner (trace). However no penetration or aspiration were noted when patient administered bolus himself. He had a natural chin tuck, that he initiated after the swallow that did not impact the swallow negatively  or positively. All swallows were delayed, initiated at the valleculae with all consistencies and volumes. When patient was administered bolus by examiner, a larger bolus was given and the swallow was initiated at the pyriform sinuses and that is also when the silent aspiration occured. Patient has mild to moderated residue after all bolus types and sizes. Recommend a Regular diet, thin liquids with small bolus sizes. Self administered if possible, followed by a second swallow to attempt to clear residue. Medication whole in puree. Assistence of staff with meals to ensrue second swallow and bolus size/portion. SLP Visit Diagnosis Dysphagia, oropharyngeal phase (R13.12) Attention and concentration deficit following -- Frontal lobe and executive function deficit following -- Impact on safety and function Mild aspiration risk   CHL IP TREATMENT RECOMMENDATION 04/29/2018 Treatment Recommendations Therapy as outlined in treatment plan  below   Prognosis 04/29/2018 Prognosis for Safe Diet Advancement Good Barriers to Reach Goals Cognitive deficits Barriers/Prognosis Comment -- CHL IP DIET RECOMMENDATION 04/29/2018 SLP Diet Recommendations Regular solids;Thin liquid Liquid Administration via Cup;No straw Medication Administration Whole meds with puree Compensations Minimize environmental distractions;Small sips/bites;Slow rate;Multiple dry swallows after each bite/sip Postural Changes Remain semi-upright after after feeds/meals (Comment);Seated upright at 90 degrees   CHL IP OTHER RECOMMENDATIONS 04/29/2018 Recommended Consults -- Oral Care Recommendations Oral care BID Other Recommendations --   CHL IP FOLLOW UP RECOMMENDATIONS 03/31/2017 Follow up Recommendations Skilled Nursing facility   [facility] IP FREQUENCY AND DURATION 04/29/2018 Speech Therapy Frequency (ACUTE ONLY) min 2x/week Treatment Duration 2 weeks      CHL IP ORAL PHASE 04/29/2018 Oral Phase WFL Oral - Pudding Teaspoon -- Oral - Pudding Cup -- Oral - Honey Teaspoon -- Oral - Honey Cup -- Oral - Nectar Teaspoon -- Oral - Nectar Cup -- Oral - Nectar Straw -- Oral - Thin Teaspoon -- Oral - Thin Cup -- Oral - Thin Straw -- Oral - Puree -- Oral - Mech Soft -- Oral - Regular -- Oral - Multi-Consistency -- Oral - Pill -- Oral Phase - Comment --  CHL IP PHARYNGEAL PHASE 04/29/2018 Pharyngeal Phase Impaired Pharyngeal- Pudding Teaspoon Delayed swallow initiation-vallecula;Pharyngeal residue - valleculae Pharyngeal -- Pharyngeal- Pudding Cup -- Pharyngeal -- Pharyngeal- Honey Teaspoon -- Pharyngeal -- Pharyngeal- Honey Cup -- Pharyngeal -- Pharyngeal- Nectar Teaspoon -- Pharyngeal -- Pharyngeal- Nectar Cup Delayed swallow initiation-vallecula;Pharyngeal residue - valleculae Pharyngeal -- Pharyngeal- Nectar Straw -- Pharyngeal -- Pharyngeal- Thin Teaspoon -- Pharyngeal -- Pharyngeal- Thin Cup Delayed swallow initiation-pyriform sinuses;Reduced airway/laryngeal closure;Reduced anterior laryngeal  mobility;Penetration/Aspiration during swallow;Trace aspiration;Pharyngeal residue - valleculae Pharyngeal Material enters airway, passes BELOW cords without attempt by patient to eject out (silent aspiration) Pharyngeal- Thin Straw -- Pharyngeal -- Pharyngeal- Puree -- Pharyngeal -- Pharyngeal- Mechanical Soft Delayed swallow initiation-vallecula;Reduced laryngeal elevation;Pharyngeal residue - valleculae Pharyngeal -- Pharyngeal- Regular -- Pharyngeal -- Pharyngeal- Multi-consistency -- Pharyngeal -- Pharyngeal- Pill -- Pharyngeal -- Pharyngeal Comment --  CHL IP CERVICAL ESOPHAGEAL PHASE 04/29/2018 Cervical Esophageal Phase WFL Pudding Teaspoon -- Pudding Cup -- Honey Teaspoon -- Honey Cup -- Nectar Teaspoon -- Nectar Cup -- Nectar Straw -- Thin Teaspoon -- Thin Cup -- Thin Straw -- Puree -- Mechanical Soft -- Regular -- Multi-consistency -- Pill -- Cervical Esophageal Comment -- Charlynne Cousins Ward 04/29/2018, 1:00 PM                The results of significant diagnostics from this hospitalization (including imaging, microbiology, ancillary and laboratory) are listed below for reference.     Microbiology: No results found for this or any  previous visit (from the past 240 hour(s)).   Labs: BNP (last 3 results) Recent Labs    04/26/18 0316 05/01/18 0034 05/11/18 1550  BNP 293.7* 1,122.5* 940.7*   Basic Metabolic Panel: Recent Labs  Lab 05/19/18 0306 05/20/18 0255 05/21/18 0338 05/22/18 1129 05/23/18 0218  NA 150* 151* 148* 146* 148*  K 3.7 3.7 3.5 3.5 3.1*  CL 117* 117* 117* 113* 110  CO2 28 28 26 25 29   GLUCOSE 152* 151* 157* 217* 157*  BUN 20 22 19 20 19   CREATININE 0.97 0.99 0.92 1.01 1.01  CALCIUM 8.0* 8.3* 8.0* 8.1* 8.4*  MG 2.1  --   --   --   --    Liver Function Tests: No results for input(s): AST, ALT, ALKPHOS, BILITOT, PROT, ALBUMIN in the last 168 hours. No results for input(s): LIPASE, AMYLASE in the last 168 hours. No results for input(s): AMMONIA in the last 168  hours. CBC: Recent Labs  Lab 05/18/18 0314 05/19/18 0306 05/20/18 0255 05/21/18 0338 05/22/18 1129  WBC 8.7 8.2 8.8 7.4 9.4  HGB 8.7* 8.2* 8.7* 8.1* 9.0*  HCT 30.2* 28.7* 30.1* 28.5* 30.8*  MCV 93.8 94.1 96.2 95.6 95.7  PLT 151 147* 155 132* 142*   Cardiac Enzymes: No results for input(s): CKTOTAL, CKMB, CKMBINDEX, TROPONINI in the last 168 hours. BNP: Invalid input(s): POCBNP CBG: Recent Labs  Lab 05/22/18 0747 05/22/18 1128 05/22/18 1710 05/22/18 2135 05/23/18 0756  GLUCAP 132* 186* 224* 145* 132*   D-Dimer No results for input(s): DDIMER in the last 72 hours. Hgb A1c No results for input(s): HGBA1C in the last 72 hours. Lipid Profile No results for input(s): CHOL, HDL, LDLCALC, TRIG, CHOLHDL, LDLDIRECT in the last 72 hours. Thyroid function studies No results for input(s): TSH, T4TOTAL, T3FREE, THYROIDAB in the last 72 hours.  Invalid input(s): FREET3 Anemia work up No results for input(s): VITAMINB12, FOLATE, FERRITIN, TIBC, IRON, RETICCTPCT in the last 72 hours. Urinalysis    Component Value Date/Time   COLORURINE YELLOW 05/01/2018 1109   APPEARANCEUR CLEAR 05/01/2018 1109   LABSPEC 1.011 05/01/2018 1109   PHURINE 6.0 05/01/2018 1109   GLUCOSEU NEGATIVE 05/01/2018 1109   HGBUR LARGE (A) 05/01/2018 1109   BILIRUBINUR NEGATIVE 05/01/2018 1109   [place] 05/01/2018 1109   PROTEINUR NEGATIVE 05/01/2018 1109   UROBILINOGEN 0.2 06/24/2014 2112   NITRITE NEGATIVE 05/01/2018 1109   LEUKOCYTESUR NEGATIVE 05/01/2018 1109   Sepsis Labs Invalid input(s): PROCALCITONIN,  WBC,  LACTICIDVEN Microbiology No results found for this or any previous visit (from the past 240 hour(s)).   Time coordinating discharge in minutes: 65  SIGNED:   Debbe Odea, MD  [facility] 05/23/2018, 8:36 AM Pager   If 7PM-7AM, please contact night-coverage [email or web address] Password [email or web address]

## 2018-05-23 NOTE — Progress Notes (Signed)
Physical Therapy Treatment Patient Details Name: Jose [name] MRN: [id number] DOB: Sep 30, 1930 Today's Date: 05/23/2018    History of Present Illness Pt is an 85 y.o. male admitted from [facility] on 05/11/18 with SOB after eating breakfast. Worked up for respiratory failure due to PNA, recurrent aspiration and acute pulmonary edema; requiring intermittent bipap. PMH includes dementia, HF, DM2, HTN, dysphagia, depression. Of note, two recent hospital admissions (initial d/c 04/30/18 with same day readmission for PNA and d/c 05/05/18).    PT Comments    Pt required +2 max to total assist bed mobility. He sat EOB x 10 minutes with min/mod assist. Increased assist required as pt fatigued. Upon return to supine, max assist required for rolling and total assist pericare due to BM. Pt positioned in right sidelying with pillows for positioning at end of session. Pt will require maximove for bed to recliner transfers.    Follow Up Recommendations  SNF;Home health PT(plans to return to [facility] with 24/7 caregiver support )     Equipment Recommendations  None recommended by PT    Recommendations for Other Services       Precautions / Restrictions Precautions Precautions: Fall    Mobility  Bed Mobility Overal bed mobility: Needs Assistance Bed Mobility: Rolling Rolling: Max assist   Supine to sit: +2 for physical assistance;Max assist Sit to supine: Total assist;+2 for physical assistance   General bed mobility comments: assist with BLE and to elevate trunk, use of bed pad to scoot to EOB and scoot up in bed upon return to supine  Transfers                 General transfer comment: unable  Ambulation/Gait             General Gait Details: unable   Stairs             Wheelchair Mobility    Modified Rankin (Stroke Patients Only)       Balance Overall balance assessment: Needs assistance Sitting-balance support: Feet supported;Single extremity  supported Sitting balance-Leahy Scale: Poor Sitting balance - Comments: min to mod assist to maintain sitting balance EOB. Pt able to perform toe taps, LAQ and reaching within BOS. Postural control: Posterior lean                                  Cognition Arousal/Alertness: Awake/alert Behavior During Therapy: WFL for tasks assessed/performed Overall Cognitive Status: History of cognitive impairments - at baseline                                 General Comments: minimal verbalizations, following commands consistently      Exercises      General Comments General comments (skin integrity, edema, etc.): VSS during session      Pertinent Vitals/Pain Pain Assessment: No/denies pain    Home Living                      Prior Function            PT Goals (current goals can now be found in the care plan section) Acute Rehab PT Goals Patient Stated Goal: maximize mobility per family PT Goal Formulation: With family Time For Goal Achievement: 06/03/18 Potential to Achieve Goals: Fair Progress towards PT goals: Progressing toward goals  Frequency    Min 2X/week      PT Plan Current plan remains appropriate    Co-evaluation PT/OT/SLP Co-Evaluation/Treatment: Yes Reason for Co-Treatment: For patient/therapist safety;Complexity of the patient's impairments (multi-system involvement) PT goals addressed during session: Mobility/safety with mobility;Balance        AM-PAC PT "6 Clicks" Mobility   Outcome Measure  Help needed turning from your back to your side while in a flat bed without using bedrails?: A Lot Help needed moving from lying on your back to sitting on the side of a flat bed without using bedrails?: A Lot Help needed moving to and from a bed to a chair (including a wheelchair)?: Total Help needed standing up from a chair using your arms (e.g., wheelchair or bedside chair)?: Total Help needed to walk in hospital room?:  Total Help needed climbing 3-5 steps with a railing? : Total 6 Click Score: 8    End of Session Equipment Utilized During Treatment: Oxygen Activity Tolerance: Patient limited by fatigue Patient left: in bed;with call bell/phone within reach;with family/visitor present Nurse Communication: Mobility status;Need for lift equipment PT Visit Diagnosis: Other abnormalities of gait and mobility (R26.89);Muscle weakness (generalized) (M62.81)     Time: 9604-5409 PT Time Calculation (min) (ACUTE ONLY): 26 min  Charges:  $Therapeutic Activity: 8-22 mins                     Lorrin Goodell, PT  Office # [phone] Pager [phone]    Lorriane Shire 05/23/2018, 12:10 PM

## 2018-05-23 NOTE — Progress Notes (Signed)
Jose [name] from [facility] called about the patient. His patient account number is [id number]. Patient is required to have a peer to peer. The phone number that needs to be called is [phone] and select option 5. This needs to be completed no later than by Monday 05/26/2018 by 12:00PM.   CSW will inform the MD.   CSW will continue to follow.   Domenic Schwab, MSW, [place]

## 2018-05-23 NOTE — Progress Notes (Signed)
Occupational Therapy Progress Note  Pt was seen in conjunction with PT.  Pt able to maintain EOB sitting x 10 mins while performing UE AAROM and reaching as well as leg raises with bil. LEs.  He required mod A, overall for EOB sitting, but was able to progress to period of min A after engaging in forward reach activities.  Pt followed one step commands consistently, but with only minimal verbalizations.  VSS throughout.  Family and caregiver present.  He is making gradual progress toward goals.  Recommend SNF at discharge.    05/23/18 1145  OT Visit Information  Last OT Received On 05/23/18  Assistance Needed +2  PT/OT/SLP Co-Evaluation/Treatment Yes  Reason for Co-Treatment Necessary to address cognition/behavior during functional activity;For patient/therapist safety  OT goals addressed during session Strengthening/ROM  History of Present Illness Pt is an 85 y.o. male admitted from [facility] on 05/11/18 with SOB after eating breakfast. Worked up for respiratory failure due to PNA, recurrent aspiration and acute pulmonary edema; requiring intermittent bipap. PMH includes dementia, HF, DM2, HTN, dysphagia, depression. Of note, two recent hospital admissions (initial d/c 04/30/18 with same day readmission for PNA and d/c 05/05/18).  Precautions  Precautions Fall  Pain Assessment  Pain Assessment Faces  Faces Pain Scale 0  Cognition  Arousal/Alertness Awake/alert  Behavior During Therapy WFL for tasks assessed/performed  Overall Cognitive Status History of cognitive impairments - at baseline  General Comments minimal verbalizations, following commands consistently  Upper Extremity Assessment  Upper Extremity Assessment Generalized weakness  Lower Extremity Assessment  Lower Extremity Assessment Defer to PT evaluation  ADL  Overall ADL's  Needs assistance/impaired  Toileting- Clothing Manipulation and Hygiene Total assistance;Bed level  Toileting - Clothing Manipulation Details  (indicate cue type and reason) Pt incontinent of stool, and was assisted with clean up at bed level   Bed Mobility  Overal bed mobility Needs Assistance  Bed Mobility Rolling  Rolling Max assist  Supine to sit +2 for physical assistance;Max assist  Sit to supine Total assist;+2 for physical assistance  General bed mobility comments assist with BLE and to elevate trunk, use of bed pad to scoot to EOB and scoot up in bed upon return to supine  Balance  Overall balance assessment Needs assistance  Sitting-balance support Feet supported;Single extremity supported  Sitting balance-Leahy Scale Poor  Sitting balance - Comments min to mod assist to maintain sitting balance EOB. Pt able to perform toe taps, LAQ and reaching within BOS.  Postural control Posterior lean  Transfers  General transfer comment unable to safely perform   General Comments  General comments (skin integrity, edema, etc.) VSS throuthout.  Family and caregiver present during session.  When pt fatigied, he looked at OT and stated "go to hell", but was very pleasant when doing so   OT - End of Session  Equipment Utilized During Treatment Oxygen  Activity Tolerance Patient tolerated treatment well  Patient left in bed;with call bell/phone within reach;with nursing/sitter in room;with family/visitor present  Nurse Communication Mobility status  OT Assessment/Plan  OT Plan Discharge plan remains appropriate  OT Visit Diagnosis Muscle weakness (generalized) (M62.81);Other symptoms and signs involving cognitive function  OT Frequency (ACUTE ONLY) Min 2X/week  Follow Up Recommendations SNF;Supervision/Assistance - 24 hour  OT Equipment None recommended by OT  AM-PAC OT "6 Clicks" Daily Activity Outcome Measure (Version 2)  Help from another person eating meals? 2  Help from another person taking care of personal grooming? 2  Help from another  person toileting, which includes using toliet, bedpan, or urinal? 1  Help from another  person bathing (including washing, rinsing, drying)? 2  Help from another person to put on and taking off regular upper body clothing? 2  Help from another person to put on and taking off regular lower body clothing? 1  6 Click Score 10  OT Goal Progression  Progress towards OT goals Progressing toward goals  OT Time Calculation  OT Start Time (ACUTE ONLY) 1127  OT Stop Time (ACUTE ONLY) 1154  OT Time Calculation (min) 27 min  OT Treatments  $Therapeutic Activity 8-22 mins  Lucille Passy, OTR/L Acute Rehabilitation Services Pager [phone] Office [phone]

## 2018-05-23 NOTE — Progress Notes (Signed)
Pt BIPAP ordered PRN. Pt stable and not in need of at this time. RT will continue to monitor.

## 2018-05-23 NOTE — Progress Notes (Signed)
Per [facility], they are unable to accept patient until Monday due to medical intensity and need for insurance authorization.   Percell Locus Liel Rudden LCSW [phone]

## 2018-05-24 LAB — GLUCOSE, CAPILLARY
GLUCOSE-CAPILLARY: 166 mg/dL — AB (ref 70–99)
Glucose-Capillary: 158 mg/dL — ABNORMAL HIGH (ref 70–99)
Glucose-Capillary: 174 mg/dL — ABNORMAL HIGH (ref 70–99)
Glucose-Capillary: 205 mg/dL — ABNORMAL HIGH (ref 70–99)

## 2018-05-24 MED ORDER — ORAL CARE MOUTH RINSE
15.0000 mL | Freq: Two times a day (BID) | OROMUCOSAL | Status: DC
  Administered 2018-05-24 – 2018-05-25 (×3): 15 mL via OROMUCOSAL

## 2018-05-24 NOTE — Progress Notes (Signed)
PROGRESS NOTE    Jose [name]   DTO:[id number]  DOB: January 08, 1931  DOA: 05/11/2018 PCP: [name], MD   Brief Narrative:  Jose [name] y.o.malewith a history of advanced dementia who is bedbound with T2DM, chronic combined CHF, HTN, and current episodes of aspiration pneumonia who presented to the ED from SNF on 2/9 with shortness of breath after breakfast, possibly due to recurrent aspiration pneumonia.  He had been hospitalized for RSV, pneumonia, and UTI treatment, discharged 1/29, returned to the ED 3 hours later with suspected recurrent aspiration.  During this hospitalization, while undergoing treatment for pneumonia, he was found to have an acute drop in hemoglobin to 5.5. GI was consulted, recommended EGD. 2u PRBCs transfused with lasix with appropriate rise in hgb to 8.5. Unfortunately he developed respiratory distress due to pulmonary edema requiring BiPAP on 2/12. Due to tenuous respiratory status and high risk of intubation, EGD was no longer pursued. PCCM also consulted as well as Palliative care medicine.   This is his third hospitalization in the past 2 months.  He returned on 2/9 for shortness of breath once again thought to be secondary to aspiration.  At baseline he is on 4 L of oxygen.  Subjective: He has no complaints today.    Assessment & Plan:   Principal Problem:   Aspiration pneumonia- dysphagia- acute on chronic respiratory failure -due to issues with recurrent aspiration, a palliative care consult has been requested -At this time, the patient's family does not want to pursue comfort care however he is DO NOT RESUSCITATE - Continue dysphagia 1 diet with nectar thick liquids -Completed a course of antibiotics -The patient is currently requiring 6 L of high flow nasal cannula -Last chest x-ray obtained on 2/15 revealed bilateral lower lobe opacities - CT chest on 2/20> b/l infiltrates- started back on Zosyn and added IV Lasix - he has improved  significantly- O2 weaned down to 2 L and no longer requiring BiPAP at bedtime-  can be discharged back to SNF   Active Problems: Acute blood loss anemia with suspected upper GI bleed The patient is a high risk for EGD and therefore currently no further investigation is planned - Continue PPI and continue to follow hemoglobin intermittently -Continue oral iron    Alzheimer disease  -Continue Namenda and Aricept    Type 2 diabetes mellitus with diabetic chronic kidney disease -Continue sliding scale insulin- sugars not very elevated - Lantus, Metformin and Januvia are on hold     Chronic combined systolic and diastolic congestive heart failure  -Currently compensated  Depression/anxiety - Continue Wellbutrin    Time spent in minutes: 35 minutes DVT prophylaxis: SCDs Code Status: DO NOT RESUSCITATE Family Communication: daughter Disposition Plan: Return to skilled nursing facility   Consultants:   Palliative care  GI  Pulmonary critical care Procedures:   None Antimicrobials:  Anti-infectives (From admission, onward)   Start     Dose/Rate Route Frequency Ordered Stop   05/23/18 0000  amoxicillin-clavulanate (AUGMENTIN) 875-125 MG tablet     1 tablet Oral 2 times daily 05/23/18 0830     05/22/18 1200  piperacillin-tazobactam (ZOSYN) IVPB 3.375 g     3.375 g 12.5 mL/hr over 240 Minutes Intravenous Every 8 hours 05/22/18 1100     05/12/18 0300  Ampicillin-Sulbactam (UNASYN) 3 g in sodium chloride 0.9 % 100 mL IVPB     3 g 200 mL/hr over 30 Minutes Intravenous Every 6 hours 05/11/18 2015 05/18/18 2054  05/11/18 1930  Ampicillin-Sulbactam (UNASYN) 3 g in sodium chloride 0.9 % 100 mL IVPB     3 g 200 mL/hr over 30 Minutes Intravenous  Once 05/11/18 1926 05/11/18 2125       Objective: Vitals:   05/23/18 2029 05/23/18 2330 05/24/18 0328 05/24/18 0701  BP: 112/72 138/86 133/78 (!) 133/91  Pulse: 73 74 76 68  Resp: (!) 23 (!) 30 20 (!) 30  Temp: 97.8 F (36.6 C)  98.2 F (36.8 C) (!) 97.5 F (36.4 C) 97.8 F (36.6 C)  TempSrc: Oral Oral Oral Oral  SpO2: 99% 99% 97% 100%  Weight:      Height:        Intake/Output Summary (Last 24 hours) at 05/24/2018 1037 Last data filed at 05/24/2018 9983 Gross per 24 hour  Intake 285.76 ml  Output 1600 ml  Net -1314.24 ml   Filed Weights   05/12/18 2100  Weight: 77 kg    Examination: General exam: Appears comfortable  HEENT: PERRLA, oral mucosa moist, no sclera icterus or thrush Respiratory system: Clear to auscultation. Respiratory effort normal. Cardiovascular system: S1 & S2 heard,  No murmurs  Gastrointestinal system: Abdomen soft, non-tender, nondistended. Normal bowel sounds   Central nervous system: Alert and oriented. No focal neurological deficits. Extremities: No cyanosis, clubbing or edema Skin: No rashes or ulcers Psychiatry:  Mood & affect appropriate.   Data Reviewed: I have personally reviewed following labs and imaging studies  CBC: Recent Labs  Lab 05/18/18 0314 05/19/18 0306 05/20/18 0255 05/21/18 0338 05/22/18 1129  WBC 8.7 8.2 8.8 7.4 9.4  HGB 8.7* 8.2* 8.7* 8.1* 9.0*  HCT 30.2* 28.7* 30.1* 28.5* 30.8*  MCV 93.8 94.1 96.2 95.6 95.7  PLT 151 147* 155 132* 382*   Basic Metabolic Panel: Recent Labs  Lab 05/19/18 0306 05/20/18 0255 05/21/18 0338 05/22/18 1129 05/23/18 0218  NA 150* 151* 148* 146* 148*  K 3.7 3.7 3.5 3.5 3.1*  CL 117* 117* 117* 113* 110  CO2 28 28 26 25 29   GLUCOSE 152* 151* 157* 217* 157*  BUN 20 22 19 20 19   CREATININE 0.97 0.99 0.92 1.01 1.01  CALCIUM 8.0* 8.3* 8.0* 8.1* 8.4*  MG 2.1  --   --   --   --    GFR: Estimated Creatinine Clearance: 58.2 mL/min (by C-G formula based on SCr of 1.01 mg/dL). Liver Function Tests: No results for input(s): AST, ALT, ALKPHOS, BILITOT, PROT, ALBUMIN in the last 168 hours. No results for input(s): LIPASE, AMYLASE in the last 168 hours. No results for input(s): AMMONIA in the last 168 hours. Coagulation  Profile: No results for input(s): INR, PROTIME in the last 168 hours. Cardiac Enzymes: No results for input(s): CKTOTAL, CKMB, CKMBINDEX, TROPONINI in the last 168 hours. BNP (last 3 results) No results for input(s): PROBNP in the last 8760 hours. HbA1C: No results for input(s): HGBA1C in the last 72 hours. CBG: Recent Labs  Lab 05/23/18 0756 05/23/18 1201 05/23/18 1650 05/23/18 2249 05/24/18 0845  GLUCAP 132* 216* 240* 162* 158*   Lipid Profile: No results for input(s): CHOL, HDL, LDLCALC, TRIG, CHOLHDL, LDLDIRECT in the last 72 hours. Thyroid Function Tests: No results for input(s): TSH, T4TOTAL, FREET4, T3FREE, THYROIDAB in the last 72 hours. Anemia Panel: No results for input(s): VITAMINB12, FOLATE, FERRITIN, TIBC, IRON, RETICCTPCT in the last 72 hours. Urine analysis:    Component Value Date/Time   COLORURINE YELLOW 05/01/2018 1109   APPEARANCEUR CLEAR 05/01/2018 1109   LABSPEC 1.011  05/01/2018 1109   PHURINE 6.0 05/01/2018 1109   [place] 05/01/2018 1109   HGBUR LARGE (A) 05/01/2018 [place] 05/01/2018 1109   [place] 05/01/2018 1109   PROTEINUR NEGATIVE 05/01/2018 1109   UROBILINOGEN 0.2 06/24/2014 2112   NITRITE NEGATIVE 05/01/2018 1109   LEUKOCYTESUR NEGATIVE 05/01/2018 1109   Sepsis Labs: @LABRCNTIP (procalcitonin:4,lacticidven:4) ) No results found for this or any previous visit (from the past 240 hour(s)).       Radiology Studies: No results found.    Scheduled Meds: . buPROPion  100 mg Oral Daily  . docusate sodium  100 mg Oral Daily  . donepezil  23 mg Oral Daily  . dorzolamide-timolol  1 drop Left Eye BID  . ferrous sulfate  325 mg Oral Q breakfast  . free water  200 mL Oral Q8H  . furosemide  20 mg Intravenous Daily  . insulin aspart  0-9 Units Subcutaneous TID WC  . memantine  28 mg Oral Daily  . pantoprazole  40 mg Oral Daily  . simvastatin  5 mg Oral q1800   Continuous Infusions: .  piperacillin-tazobactam (ZOSYN)  IV 3.375 g (05/23/18 2251)     LOS: 13 days      Debbe Odea, MD [facility] Pager: [email or web address] Password [facility] 05/24/2018, 10:37 AM

## 2018-05-24 NOTE — Progress Notes (Signed)
Notified of 6 run of VTach per [employer]. Arrived to patient room to find dozing off, right arm in gown and leads off. Pt easy to arouse, responded to name . Leads were replaced. Horris Latino at [employer] aware.

## 2018-05-25 LAB — GLUCOSE, CAPILLARY
Glucose-Capillary: 126 mg/dL — ABNORMAL HIGH (ref 70–99)
Glucose-Capillary: 202 mg/dL — ABNORMAL HIGH (ref 70–99)
Glucose-Capillary: 178 mg/dL — ABNORMAL HIGH (ref 70–99)
Glucose-Capillary: 115 mg/dL — ABNORMAL HIGH (ref 70–99)

## 2018-05-25 NOTE — Plan of Care (Signed)
Pt without signs of anxiety this shift.  Had 2 BM's yesterday.

## 2018-05-25 NOTE — Progress Notes (Signed)
PROGRESS NOTE    Jose [name]   KVQ:[id number]  DOB: 19-Jan-1931  DOA: 05/11/2018 PCP: [name], MD   Brief Narrative:  [name] y.o.malewith a history of advanced dementia who is bedbound with T2DM, chronic combined CHF, HTN, and current episodes of aspiration pneumonia who presented to the ED from SNF on 2/9 with shortness of breath after breakfast, possibly due to recurrent aspiration pneumonia.  He had been hospitalized for RSV, pneumonia, and UTI treatment, discharged 1/29, returned to the ED 3 hours later with suspected recurrent aspiration.  During this hospitalization, while undergoing treatment for pneumonia, he was found to have an acute drop in hemoglobin to 5.5. GI was consulted, recommended EGD. 2u PRBCs transfused with lasix with appropriate rise in hgb to 8.5. Unfortunately he developed respiratory distress due to pulmonary edema requiring BiPAP on 2/12. Due to tenuous respiratory status and high risk of intubation, EGD was no longer pursued. PCCM also consulted as well as Palliative care medicine.   This is his third hospitalization in the past 2 months.  He returned on 2/9 for shortness of breath once again thought to be secondary to aspiration.  At baseline he is on 4 L of oxygen.  Subjective: He has no complaints today.    Assessment & Plan:   Principal Problem:   Aspiration pneumonia- dysphagia- acute on chronic respiratory failure -due to issues with recurrent aspiration, a palliative care consult has been requested -At this time, the patient's family does not want to pursue comfort care however he is DO NOT RESUSCITATE - Continue dysphagia 1 diet with nectar thick liquids -Completed a course of antibiotics -The patient is currently requiring 6 L of high flow nasal cannula -Last chest x-ray obtained on 2/15 revealed bilateral lower lobe opacities - CT chest on 2/20> b/l infiltrates- started back on Zosyn and added IV Lasix - he has improved  significantly- O2 weaned down to 2 L and no longer requiring BiPAP at bedtime-  can be discharged back to SNF once insurance approval obtained.   Active Problems: Acute blood loss anemia with suspected upper GI bleed The patient is a high risk for EGD and therefore currently no further investigation is planned - Continue PPI and continue to follow hemoglobin intermittently -Continue oral iron    Alzheimer disease  -Continue Namenda and Aricept    Type 2 diabetes mellitus with diabetic chronic kidney disease -Continue sliding scale insulin- sugars not very elevated - Lantus, Metformin and Januvia are on hold     Chronic combined systolic and diastolic congestive heart failure  -Currently compensated  Depression/anxiety - Continue Wellbutrin    Time spent in minutes: 35 minutes DVT prophylaxis: SCDs Code Status: DO NOT RESUSCITATE Family Communication: daughter Disposition Plan: Return to skilled nursing facility   Consultants:   Palliative care  GI  Pulmonary critical care Procedures:   None Antimicrobials:  Anti-infectives (From admission, onward)   Start     Dose/Rate Route Frequency Ordered Stop   05/23/18 0000  amoxicillin-clavulanate (AUGMENTIN) 875-125 MG tablet     1 tablet Oral 2 times daily 05/23/18 0830     05/22/18 1200  piperacillin-tazobactam (ZOSYN) IVPB 3.375 g     3.375 g 12.5 mL/hr over 240 Minutes Intravenous Every 8 hours 05/22/18 1100     05/12/18 0300  Ampicillin-Sulbactam (UNASYN) 3 g in sodium chloride 0.9 % 100 mL IVPB     3 g 200 mL/hr over 30 Minutes Intravenous Every 6 hours 05/11/18 2015  05/18/18 2054   05/11/18 1930  Ampicillin-Sulbactam (UNASYN) 3 g in sodium chloride 0.9 % 100 mL IVPB     3 g 200 mL/hr over 30 Minutes Intravenous  Once 05/11/18 1926 05/11/18 2125       Objective: Vitals:   05/24/18 1936 05/24/18 2315 05/25/18 0352 05/25/18 0740  BP: 133/81 132/79 128/80 (!) 123/99  Pulse: 86 76 81 74  Resp: (!) 27 14 (!) 33  19  Temp: 98 F (36.7 C) 97.7 F (36.5 C) 97.7 F (36.5 C) 97.9 F (36.6 C)  TempSrc: Oral Oral Oral Oral  SpO2: 97% 98% 98% 99%  Weight:      Height:        Intake/Output Summary (Last 24 hours) at 05/25/2018 0902 Last data filed at 05/25/2018 0226 Gross per 24 hour  Intake 387.36 ml  Output 1150 ml  Net -762.64 ml   Filed Weights   05/12/18 2100  Weight: 77 kg    Examination: General exam: Appears comfortable - mostly nonverbal HEENT: PERRLA, oral mucosa moist, no sclera icterus or thrush Respiratory system: Clear to auscultation. Respiratory effort normal. Cardiovascular system: S1 & S2 heard,  No murmurs  Gastrointestinal system: Abdomen soft, non-tender, nondistended. Normal bowel sounds   Central nervous system: Alert and oriented. Not able to move legs Extremities: No cyanosis, clubbing or edema Skin: No rashes or ulcers Psychiatry:  Mood & affect appropriate.   Data Reviewed: I have personally reviewed following labs and imaging studies  CBC: Recent Labs  Lab 05/19/18 0306 05/20/18 0255 05/21/18 0338 05/22/18 1129  WBC 8.2 8.8 7.4 9.4  HGB 8.2* 8.7* 8.1* 9.0*  HCT 28.7* 30.1* 28.5* 30.8*  MCV 94.1 96.2 95.6 95.7  PLT 147* 155 132* 093*   Basic Metabolic Panel: Recent Labs  Lab 05/19/18 0306 05/20/18 0255 05/21/18 0338 05/22/18 1129 05/23/18 0218  NA 150* 151* 148* 146* 148*  K 3.7 3.7 3.5 3.5 3.1*  CL 117* 117* 117* 113* 110  CO2 28 28 26 25 29   GLUCOSE 152* 151* 157* 217* 157*  BUN 20 22 19 20 19   CREATININE 0.97 0.99 0.92 1.01 1.01  CALCIUM 8.0* 8.3* 8.0* 8.1* 8.4*  MG 2.1  --   --   --   --    GFR: Estimated Creatinine Clearance: 58.2 mL/min (by C-G formula based on SCr of 1.01 mg/dL). Liver Function Tests: No results for input(s): AST, ALT, ALKPHOS, BILITOT, PROT, ALBUMIN in the last 168 hours. No results for input(s): LIPASE, AMYLASE in the last 168 hours. No results for input(s): AMMONIA in the last 168 hours. Coagulation  Profile: No results for input(s): INR, PROTIME in the last 168 hours. Cardiac Enzymes: No results for input(s): CKTOTAL, CKMB, CKMBINDEX, TROPONINI in the last 168 hours. BNP (last 3 results) No results for input(s): PROBNP in the last 8760 hours. HbA1C: No results for input(s): HGBA1C in the last 72 hours. CBG: Recent Labs  Lab 05/24/18 0845 05/24/18 1223 05/24/18 1737 05/24/18 2132 05/25/18 0743  GLUCAP 158* 174* 205* 166* 126*   Lipid Profile: No results for input(s): CHOL, HDL, LDLCALC, TRIG, CHOLHDL, LDLDIRECT in the last 72 hours. Thyroid Function Tests: No results for input(s): TSH, T4TOTAL, FREET4, T3FREE, THYROIDAB in the last 72 hours. Anemia Panel: No results for input(s): VITAMINB12, FOLATE, FERRITIN, TIBC, IRON, RETICCTPCT in the last 72 hours. Urine analysis:    Component Value Date/Time   COLORURINE YELLOW 05/01/2018 1109   APPEARANCEUR CLEAR 05/01/2018 1109   LABSPEC 1.011 05/01/2018 1109  PHURINE 6.0 05/01/2018 1109   GLUCOSEU NEGATIVE 05/01/2018 1109   HGBUR LARGE (A) 05/01/2018 1109   BILIRUBINUR NEGATIVE 05/01/2018 1109   [place] 05/01/2018 1109   PROTEINUR NEGATIVE 05/01/2018 1109   UROBILINOGEN 0.2 06/24/2014 2112   NITRITE NEGATIVE 05/01/2018 1109   LEUKOCYTESUR NEGATIVE 05/01/2018 1109   Sepsis Labs: @LABRCNTIP (procalcitonin:4,lacticidven:4) ) No results found for this or any previous visit (from the past 240 hour(s)).       Radiology Studies: No results found.    Scheduled Meds: . buPROPion  100 mg Oral Daily  . docusate sodium  100 mg Oral Daily  . donepezil  23 mg Oral Daily  . dorzolamide-timolol  1 drop Left Eye BID  . ferrous sulfate  325 mg Oral Q breakfast  . free water  200 mL Oral Q8H  . furosemide  20 mg Intravenous Daily  . insulin aspart  0-9 Units Subcutaneous TID WC  . mouth rinse  15 mL Mouth Rinse BID  . memantine  28 mg Oral Daily  . pantoprazole  40 mg Oral Daily  . simvastatin  5 mg Oral q1800    Continuous Infusions: . piperacillin-tazobactam (ZOSYN)  IV 3.375 g (05/25/18 2542)     LOS: 14 days      Debbe Odea, MD [facility] Pager: [email or web address] Password [email or web address] 05/25/2018, 9:02 AM

## 2018-05-26 ENCOUNTER — Ambulatory Visit: Payer: Medicare Other | Admitting: Family Medicine

## 2018-05-26 LAB — GLUCOSE, CAPILLARY
Glucose-Capillary: 121 mg/dL — ABNORMAL HIGH (ref 70–99)
Glucose-Capillary: 182 mg/dL — ABNORMAL HIGH (ref 70–99)

## 2018-05-26 MED ORDER — AMOXICILLIN-POT CLAVULANATE 875-125 MG PO TABS: 1.0000 | ORAL_TABLET | Freq: Two times a day (BID) | ORAL | 0 refills | Status: AC

## 2018-05-26 NOTE — Plan of Care (Signed)
  Problem: Education: Goal: Knowledge of General Education information will improve Description: Including pain rating scale, medication(s)/side effects and non-pharmacologic comfort measures Outcome: Progressing   Problem: Clinical Measurements: Goal: Ability to maintain clinical measurements within normal limits will improve Outcome: Progressing   Problem: Clinical Measurements: Goal: Respiratory complications will improve Outcome: Progressing   

## 2018-05-26 NOTE — Clinical Social Work Placement (Signed)
   CLINICAL SOCIAL WORK PLACEMENT  NOTE  Date:  05/26/2018  Patient Details  Name: Jose [name] MRN: [id number] Date of Birth: Dec 10, 1930  Clinical Social Work is seeking post-discharge placement for this patient at the [place] level of care (*CSW will initial, date and re-position this form in  chart as items are completed):      Patient/family provided with [facility] Work Department's list of facilities offering this level of care within the geographic area requested by the patient (or if unable, by the patient's family).  Yes   Patient/family informed of their freedom to choose among providers that offer the needed level of care, that participate in Medicare, Medicaid or managed care program needed by the patient, have an available bed and are willing to accept the patient.      Patient/family informed of [facility]'s ownership interest in [facility] and [facility], as well as of the fact that they are under no obligation to receive care at these facilities.  PASRR submitted to EDS on       PASRR number received on       Existing PASRR number confirmed on       FL2 transmitted to all facilities in geographic area requested by pt/family on 05/05/18     FL2 transmitted to all facilities within larger geographic area on       Patient informed that his/her managed care company has contracts with or will negotiate with certain facilities, including the following:            Patient/family informed of bed offers received.  Patient chooses bed at [facility]     Physician recommends and patient chooses bed at      Patient to be transferred to [facility] on 05/26/18.  Patient to be transferred to facility by [facility]     Patient family notified on 05/26/18 of transfer.  Name of family member notified:  [name], spouse     PHYSICIAN       Additional Comment:    _______________________________________________ Eileen Stanford, LCSW 05/26/2018, 12:18 PM

## 2018-05-26 NOTE — Discharge Summary (Addendum)
Physician Discharge Summary  KORAY SOTER WCH:[id number] DOB: June 21, 1930 DOA: 05/11/2018  PCP: [name], MD  Admit date: 05/11/2018 Discharge date: 05/26/2018  Admitted From: SNF  Disposition:  SNF   Recommendations for Outpatient Follow-up:  1. F/u Hb intermittently (weekly) and transfuse as needed in short stay or outpt infusion center rather than sending to ED for transfusions 2. High aspiration risk 3. Palliative care to follow at SNF- not fully comfort care at this time   Discharge Condition:  stable   CODE STATUS:   DNR-  Diet recommendation:  D1 diet with nectar thick liquids Consultations:  Palliative care    Discharge Diagnoses:  Principal Problem:   Aspiration pneumonia ([place]) Active Problems:   Acute hypoxemic respiratory failure ([place])   Alzheimer disease ([place])   History of bilateral hip replacements   Type 2 diabetes mellitus with diabetic chronic kidney disease ([place])   Chronic combined systolic and diastolic congestive heart failure ([place])   Dysphagia   HTN (hypertension)   DNR (do not resuscitate) discussion   Eye irritation   Goals of care, counseling/discussion   Palliative care encounter   Upper GI bleed   Adult failure to thrive       Brief Summary: Jose [name] an87 y.o.malewith a history of advanced dementia who is bedbound with T2DM, chronic combined CHF, HTN, and current episodes of aspiration pneumonia who presented to the ED from SNF on 2/9 with shortness of breath after breakfast, possibly due to recurrent aspiration pneumonia.  He had been hospitalized for RSV, pneumonia, and UTI treatment, discharged 1/29, returned to the ED 3 hours later with suspected recurrent aspiration.  During this hospitalization, while undergoing treatment for pneumonia, he was found to have an acute drop in hemoglobin to 5.5. GI was consulted, recommended EGD. 2u PRBCs transfused with lasix with appropriate rise in hgb to 8.5. Unfortunately he developed  respiratory distress due to pulmonary edema requiring BiPAP on 2/12. Due to tenuous respiratory status and high risk of intubation, EGD was no longer pursued. PCCM also consulted as well as Palliative care medicine.  This is his third hospitalization in the past 2 months.  He returned on 2/9 for shortness of breath once again thought to be secondary to aspiration.  At baseline he is on 4 L of oxygen.   Hospital Course:  Principal Problem:   Aspiration pneumonia with recurrent aspirations- dysphagia- acute on chronic respiratory failure -due to issues with recurrent aspiration, a palliative care consult has been requested -At this time, the patient's family does not want to pursue comfort care however he is DO NOT RESUSCITATE - Continue dysphagia 1 diet with nectar thick liquids  - despite completing a course of antibiotics, the patient continued to require 6 L of high flow nasal cannula and BiPAP at bedtime  -Last chest x-ray obtained on 2/15 revealed bilateral lower lobe opacities - due to ongoing hypoxia a CT chest on 2/20> b/l infiltrates- started back on Zosyn and added IV Lasix - he has improved significantly- O2 weaned down to 2 L and no longer requiring BiPAP at bedtime-  can be discharged back to SNF  - recommend 2 more days of Augmentin   Active Problems: Acute blood loss anemia with suspected upper GI bleed The patient is a high risk for EGD and therefore, no further investigation is planned - Continue PPI and continue to follow hemoglobin intermittently -Continue oral iron    Alzheimer disease  -Continue Namenda and Aricept- pleasantly confused but  recognizes family     Type 2 diabetes mellitus with diabetic chronic kidney disease -Continue sliding scale insulin- sugars not very elevated - Lantus, Metformin and Januvia are on hold as sugars have not been elevated - will resume Metformin tomorrow    Chronic combined systolic and diastolic congestive heart failure   -Currently compensated  Depression/anxiety - Continue Wellbutrin  Bed bound/ wheelchair bound    Discharge Exam: Vitals:   05/26/18 0409 05/26/18 0754  BP: 136/79 133/80  Pulse: 74 70  Resp: 16 (!) 30  Temp: 97.9 F (36.6 C) 97.9 F (36.6 C)  SpO2: 94% 93%   Vitals:   05/25/18 2009 05/25/18 2359 05/26/18 0409 05/26/18 0754  BP: 127/84 133/71 136/79 133/80  Pulse: 79 74 74 70  Resp: (!) 29 (!) 23 16 (!) 30  Temp: 97.8 F (36.6 C) 97.7 F (36.5 C) 97.9 F (36.6 C) 97.9 F (36.6 C)  TempSrc: Oral Oral Oral Oral  SpO2: 100% 95% 94% 93%  Weight:      Height:        General: Pt is alert, awake, not in acute distress Cardiovascular: RRR, S1/S2 +, no rubs, no gallops Respiratory: CTA bilaterally, no wheezing, no rhonchi Abdominal: Soft, NT, ND, bowel sounds + Extremities: no edema, no cyanosis   Discharge Instructions  Discharge Instructions    Increase activity slowly   Complete by:  As directed      Allergies as of 05/26/2018      Reactions   Axona [bacid] Other (See Comments)   Unknown per MAR   Gabapentin Other (See Comments)   Hallucinations      Medication List    STOP taking these medications   insulin glargine 100 UNIT/ML injection Commonly known as:  LANTUS   lisinopril 5 MG tablet Commonly known as:  PRINIVIL,ZESTRIL   sitaGLIPtin 25 MG tablet Commonly known as:  JANUVIA     TAKE these medications   albuterol (2.5 MG/3ML) 0.083% nebulizer solution Commonly known as:  PROVENTIL Take 3 mLs (2.5 mg total) by nebulization every 4 (four) hours as needed for wheezing or shortness of breath.   ALIGN 4 MG Caps Take 4 mg by mouth daily.   amoxicillin-clavulanate 875-125 MG tablet Commonly known as:  AUGMENTIN Take 1 tablet by mouth every 12 (twelve) hours for 10 days.   buPROPion 100 MG 12 hr tablet Commonly known as:  WELLBUTRIN SR Take 100 mg by mouth daily.   carvedilol 3.125 MG tablet Commonly known as:  COREG Take 1 tablet  (3.125 mg total) by mouth 2 (two) times daily with a meal.   docusate sodium 100 MG capsule Commonly known as:  COLACE Take 100 mg by mouth daily.   donepezil 23 MG Tabs tablet Commonly known as:  ARICEPT Take 1 tablet (23 mg total) by mouth daily.   dorzolamide-timolol 22.3-6.8 MG/ML ophthalmic solution Commonly known as:  COSOPT Place 1 drop into the left eye 2 (two) times daily.   ferrous sulfate 325 (65 FE) MG tablet Take 325 mg by mouth daily with breakfast.   Flaxseed Oil 1000 MG Caps Take 1,000 mg by mouth daily.   folic acid 093 MCG tablet Commonly known as:  FOLVITE Take 800 mcg by mouth every evening.   furosemide 20 MG tablet Commonly known as:  LASIX Take 1 tablet (20 mg total) by mouth daily.   memantine 28 MG Cp24 24 hr capsule Commonly known as:  NAMENDA XR Take 1 capsule (28 mg total) by  mouth daily.   metFORMIN 500 MG tablet Commonly known as:  GLUCOPHAGE Take 500 mg by mouth 2 (two) times daily with a meal.   multivitamin tablet Take 1 tablet by mouth daily.   pantoprazole 40 MG tablet Commonly known as:  PROTONIX Take 1 tablet (40 mg total) by mouth daily.   simvastatin 5 MG tablet Commonly known as:  ZOCOR Take 5 mg by mouth daily.   Vitamin D 1000 units capsule Take 1,000 Units by mouth daily.       Allergies  Allergen Reactions  . Axona [Bacid] Other (See Comments)    Unknown per MAR  . Gabapentin Other (See Comments)    Hallucinations     Procedures/Studies:    Dg Chest 2 View  Result Date: 05/11/2018 CLINICAL DATA:  Possible pneumonia EXAM: CHEST - 2 VIEW COMPARISON:  Chest radiograph 05/01/2018 FINDINGS: Monitoring leads overlie the patient. Stable cardiac and mediastinal contours. Low lung volumes. Bibasilar heterogeneous pulmonary opacities. No pleural effusion or pneumothorax. Thoracic spine degenerative changes. Elevation right hemidiaphragm. IMPRESSION: Right-greater-than-left basilar opacities favored to represent  atelectasis. Electronically Signed   By: Lovey Newcomer M.D.   On: 05/11/2018 17:15   Ct Chest W Contrast  Result Date: 05/22/2018 CLINICAL DATA:  SOB per pt chart; Pt is totally non-verbal, denies chest pain EXAM: CT CHEST WITH CONTRAST TECHNIQUE: Multidetector CT imaging of the chest was performed during intravenous contrast administration. CONTRAST:  27mL OMNIPAQUE IOHEXOL 300 MG/ML  SOLN COMPARISON:  11/28/2016 FINDINGS: Cardiovascular: Heart size upper limits normal. The RV is nondilated. No pleural effusion. Coronary and aortic calcifications without aneurysm. Mediastinum/Nodes: No pathologically enlarged mediastinal lymph nodes. Calcified bilateral hilar lymph nodes. Lungs/Pleura: Small pleural effusions right greater than left. No pneumothorax. Patchy airspace opacities in the right upper lobe primarily apical and posterior segments, to a lesser degree in the inferomedial lingula and posterior left upper lobe. Dependent atelectasis posteriorly in both lower lobes. Upper Abdomen: No acute findings. Moderate gastric distention. Musculoskeletal: Anterior vertebral endplate spurring at multiple levels in the mid and lower thoracic spine. Spondylitic changes in the lower cervical spine. IMPRESSION: 1. Patchy airspace opacities in the right upper lobe, lingula, and posterior left upper lobe may represent pneumonia or atypical pulmonary edema. 2. Small pleural effusions right greater than left. 3. Coronary and Aortic Atherosclerosis (ICD10-I70.0). Electronically Signed   By: Lucrezia Europe M.D.   On: 05/22/2018 10:37   Dg Chest Port 1 View  Result Date: 05/21/2018 CLINICAL DATA:  85 y/o  M; shortness of breath and crackles. EXAM: PORTABLE CHEST 1 VIEW COMPARISON:  05/17/2018 chest radiograph FINDINGS: Normal cardiac silhouette. Aortic atherosclerosis with calcification. Diffusely increased reticular and patchy airspace opacities greatest in the right lung. No pleural effusion or pneumothorax. No acute osseous  abnormality is evident. IMPRESSION: Diffusely increased reticular and patchy airspace opacities greatest in the right lung probably represents interstitial and alveolar pulmonary edema. Underlying pneumonia is not excluded. Electronically Signed   By: Kristine Garbe M.D.   On: 05/21/2018 22:39   Dg Chest Port 1 View  Result Date: 05/17/2018 CLINICAL DATA:  Acute hypoxemic respiratory failure. History of congestive heart failure and diabetes. EXAM: PORTABLE CHEST 1 VIEW COMPARISON:  Radiographs 05/15/2018 and 05/14/2018. FINDINGS: 0801 hours. Two views submitted. The heart size and mediastinal contours are stable. There are fluctuating bilateral airspace opacities with current components in both lower lobes and the right upper lobe, similar to the most recent study. Pulmonary vascularity appears normal. There are probable small bilateral pleural  effusions. No pneumothorax or acute osseous findings. IMPRESSION: Unchanged bilateral airspace opacities compared with most recent study of 2 days ago. These findings likely represent a combination of atelectasis and inflammation. No definite residual edema. Electronically Signed   By: Richardean Sale M.D.   On: 05/17/2018 10:08   Dg Chest Port 1 View  Result Date: 05/15/2018 CLINICAL DATA:  Initial evaluation for acute increase in shortness of breath. EXAM: PORTABLE CHEST 1 VIEW COMPARISON:  Prior radiograph from earlier the same day. FINDINGS: Stable cardiomegaly.  Mediastinal silhouette within normal limits. Lungs hypoinflated with elevation of right hemidiaphragm, similar. Dense retrocardiac left lower opacity most compatible with atelectasis. Increased confluent opacity at the inferior right upper lobe, which could reflect atelectasis and/or worsening/new infiltrate. Mildly increased right basilar atelectasis. Underlying diffuse pulmonary interstitial edema with scattered Kerley B-lines, slightly worsened. Small bilateral pleural effusions. No  pneumothorax. Osseous structures unchanged. IMPRESSION: 1. Slight interval worsening in mild diffuse pulmonary interstitial edema. Small bilateral pleural effusions. 2. Increased confluent right perihilar opacity, which could reflect progressive atelectasis and/or infiltrate. 3. Persistent bibasilar atelectasis. Electronically Signed   By: Jeannine Boga M.D.   On: 05/15/2018 23:40   Dg Chest Port 1 View  Result Date: 05/15/2018 CLINICAL DATA:  Acute respiratory failure EXAM: PORTABLE CHEST 1 VIEW COMPARISON:  05/14/2018 FINDINGS: Cardiomegaly. Improving bilateral opacities. Continued mild residual right perihilar opacities and bibasilar atelectasis. Suspect small layering effusions. No acute bony abnormality. IMPRESSION: Improving bilateral airspace disease. Continued right perihilar opacities and bibasilar atelectasis. Question small effusions. Electronically Signed   By: Rolm Baptise M.D.   On: 05/15/2018 03:32   Dg Chest Port 1 View  Result Date: 05/14/2018 CLINICAL DATA:  Acute respiratory failure with shortness of breath EXAM: PORTABLE CHEST 1 VIEW COMPARISON:  Yesterday FINDINGS: Worsening interstitial opacity with Kerley lines. Low volume chest with increased hazy and streaky density on the right. Borderline heart size. Negative aortic and mediastinal contours. IMPRESSION: 1. Pulmonary edema. 2. Increased asymmetric right lung opacity that could be atelectasis or infection. Lung volumes are low. Electronically Signed   By: Monte Fantasia M.D.   On: 05/14/2018 04:41   Dg Chest Port 1 View  Result Date: 05/13/2018 CLINICAL DATA:  Pneumonia. EXAM: PORTABLE CHEST 1 VIEW COMPARISON:  Radiograph May 11, 2018. FINDINGS: Stable cardiomegaly. Atherosclerosis of thoracic aorta is noted. Stable elevated right hemidiaphragm is noted. Mild bibasilar subsegmental atelectasis is noted. No pneumothorax or pleural effusion is noted. Bony thorax is unremarkable. IMPRESSION: Mild bibasilar subsegmental  atelectasis. Aortic Atherosclerosis (ICD10-I70.0). Electronically Signed   By: Marijo Conception, M.D.   On: 05/13/2018 08:51   Dg Chest Portable 1 View  Result Date: 05/01/2018 CLINICAL DATA:  Shortness of breath. Recent left lower lobe pneumonia. EXAM: PORTABLE CHEST 1 VIEW COMPARISON:  Radiographs 3 days ago 04/28/2018, additional priors. FINDINGS: No significant change in left lung base opacity from prior exam. Unchanged right perihilar atelectasis. Unchanged heart size and mediastinal contours. Unchanged low lung volumes. Mild basilar septal thickening and peribronchial thickening suspicious for mild pulmonary edema. No pneumothorax. IMPRESSION: 1. No significant change in left lung base opacity from prior exam. Unchanged right perihilar atelectasis. 2. Mild basilar septal and peribronchial thickening, suspicious for pulmonary edema. Electronically Signed   By: Keith Rake M.D.   On: 05/01/2018 00:52   Dg Chest Port 1 View  Result Date: 04/28/2018 CLINICAL DATA:  Respiratory failure. EXAM: PORTABLE CHEST 1 VIEW COMPARISON:  04/25/2018 FINDINGS: The cardiomediastinal silhouette is unchanged with upper limits of  normal heart size. There is persistent elevation of the right hemidiaphragm with overall low lung volumes. Retrocardiac opacity in the left lower lobe has decreased from the prior study and likely represents atelectasis. The right lung remains grossly clear. No sizable pleural effusion or pneumothorax is identified. IMPRESSION: Improved left lower lobe aeration. Electronically Signed   By: Logan Bores M.D.   On: 04/28/2018 07:18   Dg Swallowing Func-speech Pathology  Result Date: 04/29/2018 Objective Swallowing Evaluation: Type of Study: MBS-Modified Barium Swallow Study  Patient Details Name: LAMINE LATON MRN: [id number] Date of Birth: Apr 30, 1930 Today's Date: 04/29/2018 Time: SLP Start Time (ACUTE ONLY): 1210 -SLP Stop Time (ACUTE ONLY): 1230 SLP Time Calculation (min) (ACUTE ONLY): 20 min  Past Medical History: Past Medical History: Diagnosis Date . Abnormality of gait  . Backache, unspecified  . CHF (congestive heart failure) ([place])  . Colon polyps   adenomatous . Dementia ([place]) 11/12/2012 . Depression  . Diabetes mellitus without complication ([place])  . Diverticulosis  . Essential and other specified forms of tremor  . Hemorrhoids  . History of bilateral hip replacements 11/12/2012 . Memory loss  . Other persistent mental disorders due to conditions classified elsewhere  . Pain in joint, pelvic region and thigh  . Skin cancer of scalp  . Spinal stenosis, lumbar region, without neurogenic claudication  . Unspecified hereditary and idiopathic peripheral neuropathy  Past Surgical History: Past Surgical History: Procedure Laterality Date . CATARACT EXTRACTION, BILATERAL  2012 . JOINT REPLACEMENT   . RETINAL LASER PROCEDURE  2012  retinal wrinkle . SKIN CANCER EXCISION   . TOTAL HIP ARTHROPLASTY Left 2000 . TOTAL HIP ARTHROPLASTY (aka REPLACEMENT) Right 3380 HPI: 85 year old man with history of CHF, dementia, DM, HTN, iron deficiency anemia presenting with altered mental status. Unable to obtain history from patient due to acute encephalopathy and underlying dementia. Marland Kitchen He has been having respiratory infection symptoms for the past 1.5 to 2 weeks, including cough that sounds productive. Patient had MBS 03/2017 with mild oropharyngeal dysphagia and discharged with Dys 1, thin liquid diet.  Subjective: alert, cooperative Assessment / Plan / Recommendation CHL IP CLINICAL IMPRESSIONS 04/29/2018 Clinical Impression Patient has mild oropharyngeal dysphagia. Thin liquids with cup sip were aspirated silently when bolus presented by cup, by examiner (trace). However no penetration or aspiration were noted when patient administered bolus himself. He had a natural chin tuck, that he initiated after the swallow that did not impact the swallow negatively or positively. All swallows were delayed, initiated at the  valleculae with all consistencies and volumes. When patient was administered bolus by examiner, a larger bolus was given and the swallow was initiated at the pyriform sinuses and that is also when the silent aspiration occured. Patient has mild to moderated residue after all bolus types and sizes. Recommend a Regular diet, thin liquids with small bolus sizes. Self administered if possible, followed by a second swallow to attempt to clear residue. Medication whole in puree. Assistence of staff with meals to ensrue second swallow and bolus size/portion. SLP Visit Diagnosis Dysphagia, oropharyngeal phase (R13.12) Attention and concentration deficit following -- Frontal lobe and executive function deficit following -- Impact on safety and function Mild aspiration risk   CHL IP TREATMENT RECOMMENDATION 04/29/2018 Treatment Recommendations Therapy as outlined in treatment plan below   Prognosis 04/29/2018 Prognosis for Safe Diet Advancement Good Barriers to Reach Goals Cognitive deficits Barriers/Prognosis Comment -- CHL IP DIET RECOMMENDATION 04/29/2018 SLP Diet Recommendations Regular solids;Thin liquid Liquid Administration via Cup;No straw  Medication Administration Whole meds with puree Compensations Minimize environmental distractions;Small sips/bites;Slow rate;Multiple dry swallows after each bite/sip Postural Changes Remain semi-upright after after feeds/meals (Comment);Seated upright at 90 degrees   CHL IP OTHER RECOMMENDATIONS 04/29/2018 Recommended Consults -- Oral Care Recommendations Oral care BID Other Recommendations --   CHL IP FOLLOW UP RECOMMENDATIONS 03/31/2017 Follow up Recommendations Skilled Nursing facility   [facility] IP FREQUENCY AND DURATION 04/29/2018 Speech Therapy Frequency (ACUTE ONLY) min 2x/week Treatment Duration 2 weeks      CHL IP ORAL PHASE 04/29/2018 Oral Phase WFL Oral - Pudding Teaspoon -- Oral - Pudding Cup -- Oral - Honey Teaspoon -- Oral - Honey Cup -- Oral - Nectar Teaspoon -- Oral - Nectar  Cup -- Oral - Nectar Straw -- Oral - Thin Teaspoon -- Oral - Thin Cup -- Oral - Thin Straw -- Oral - Puree -- Oral - Mech Soft -- Oral - Regular -- Oral - Multi-Consistency -- Oral - Pill -- Oral Phase - Comment --  CHL IP PHARYNGEAL PHASE 04/29/2018 Pharyngeal Phase Impaired Pharyngeal- Pudding Teaspoon Delayed swallow initiation-vallecula;Pharyngeal residue - valleculae Pharyngeal -- Pharyngeal- Pudding Cup -- Pharyngeal -- Pharyngeal- Honey Teaspoon -- Pharyngeal -- Pharyngeal- Honey Cup -- Pharyngeal -- Pharyngeal- Nectar Teaspoon -- Pharyngeal -- Pharyngeal- Nectar Cup Delayed swallow initiation-vallecula;Pharyngeal residue - valleculae Pharyngeal -- Pharyngeal- Nectar Straw -- Pharyngeal -- Pharyngeal- Thin Teaspoon -- Pharyngeal -- Pharyngeal- Thin Cup Delayed swallow initiation-pyriform sinuses;Reduced airway/laryngeal closure;Reduced anterior laryngeal mobility;Penetration/Aspiration during swallow;Trace aspiration;Pharyngeal residue - valleculae Pharyngeal Material enters airway, passes BELOW cords without attempt by patient to eject out (silent aspiration) Pharyngeal- Thin Straw -- Pharyngeal -- Pharyngeal- Puree -- Pharyngeal -- Pharyngeal- Mechanical Soft Delayed swallow initiation-vallecula;Reduced laryngeal elevation;Pharyngeal residue - valleculae Pharyngeal -- Pharyngeal- Regular -- Pharyngeal -- Pharyngeal- Multi-consistency -- Pharyngeal -- Pharyngeal- Pill -- Pharyngeal -- Pharyngeal Comment --  CHL IP CERVICAL ESOPHAGEAL PHASE 04/29/2018 Cervical Esophageal Phase WFL Pudding Teaspoon -- Pudding Cup -- Honey Teaspoon -- Honey Cup -- Nectar Teaspoon -- Nectar Cup -- Nectar Straw -- Thin Teaspoon -- Thin Cup -- Thin Straw -- Puree -- Mechanical Soft -- Regular -- Multi-consistency -- Pill -- Cervical Esophageal Comment -- Charlynne Cousins Ward 04/29/2018, 1:00 PM                The results of significant diagnostics from this hospitalization (including imaging, microbiology, ancillary and laboratory) are  listed below for reference.     Microbiology: No results found for this or any previous visit (from the past 240 hour(s)).   Labs: BNP (last 3 results) Recent Labs    04/26/18 0316 05/01/18 0034 05/11/18 1550  BNP 293.7* 1,122.5* 569.7*   Basic Metabolic Panel: Recent Labs  Lab 05/20/18 0255 05/21/18 0338 05/22/18 1129 05/23/18 0218  NA 151* 148* 146* 148*  K 3.7 3.5 3.5 3.1*  CL 117* 117* 113* 110  CO2 28 26 25 29   GLUCOSE 151* 157* 217* 157*  BUN 22 19 20 19   CREATININE 0.99 0.92 1.01 1.01  CALCIUM 8.3* 8.0* 8.1* 8.4*   Liver Function Tests: No results for input(s): AST, ALT, ALKPHOS, BILITOT, PROT, ALBUMIN in the last 168 hours. No results for input(s): LIPASE, AMYLASE in the last 168 hours. No results for input(s): AMMONIA in the last 168 hours. CBC: Recent Labs  Lab 05/20/18 0255 05/21/18 0338 05/22/18 1129  WBC 8.8 7.4 9.4  HGB 8.7* 8.1* 9.0*  HCT 30.1* 28.5* 30.8*  MCV 96.2 95.6 95.7  PLT 155 132* 142*   Cardiac Enzymes: No  results for input(s): CKTOTAL, CKMB, CKMBINDEX, TROPONINI in the last 168 hours. BNP: Invalid input(s): POCBNP CBG: Recent Labs  Lab 05/25/18 0743 05/25/18 1221 05/25/18 1647 05/25/18 2107 05/26/18 0753  GLUCAP 126* 202* 178* 115* 121*   D-Dimer No results for input(s): DDIMER in the last 72 hours. Hgb A1c No results for input(s): HGBA1C in the last 72 hours. Lipid Profile No results for input(s): CHOL, HDL, LDLCALC, TRIG, CHOLHDL, LDLDIRECT in the last 72 hours. Thyroid function studies No results for input(s): TSH, T4TOTAL, T3FREE, THYROIDAB in the last 72 hours.  Invalid input(s): FREET3 Anemia work up No results for input(s): VITAMINB12, FOLATE, FERRITIN, TIBC, IRON, RETICCTPCT in the last 72 hours. Urinalysis    Component Value Date/Time   COLORURINE YELLOW 05/01/2018 1109   APPEARANCEUR CLEAR 05/01/2018 1109   LABSPEC 1.011 05/01/2018 1109   PHURINE 6.0 05/01/2018 1109   GLUCOSEU NEGATIVE 05/01/2018 1109    HGBUR LARGE (A) 05/01/2018 1109   BILIRUBINUR NEGATIVE 05/01/2018 1109   [place] 05/01/2018 1109   PROTEINUR NEGATIVE 05/01/2018 1109   UROBILINOGEN 0.2 06/24/2014 2112   NITRITE NEGATIVE 05/01/2018 1109   LEUKOCYTESUR NEGATIVE 05/01/2018 1109   Sepsis Labs Invalid input(s): PROCALCITONIN,  WBC,  LACTICIDVEN Microbiology No results found for this or any previous visit (from the past 240 hour(s)).   Time coordinating discharge in minutes: 55  SIGNED:   Debbe Odea, MD  [facility] 05/26/2018, 8:58 AM Pager   If 7PM-7AM, please contact night-coverage [email or web address] Password [email or web address]

## 2018-05-26 NOTE — Progress Notes (Deleted)
PATIENT: Jose [name] DOB: Feb 04, 1931  REASON FOR VISIT: follow up HISTORY FROM: patient  No chief complaint on file.    HISTORY OF PRESENT ILLNESS: Today 05/26/18 Jose [name] is a 85 y.o. male here today for follow up. He has had several ER visits/hospitalizations over the past 2 months.    HISTORY: (copied from [place] note on 10/14/2017) Mr. Lehner is an 85 year old male with a history of memory disturbance.  He returns today for follow-up.  The patient continues on Aricept and Namenda.  Reports that he is tolerating the medications well.  He continues to live at  friend's home.  He does require assistance with ADLs.  He primarily uses a wheelchair.  He is able to use a walker with assistance.  He is completing physical therapy 3 times a week.  Denies any trouble with his sleep.  Reports good appetite.  No change in his mood or behavior.  Patient feels that his memory has remained stable.  He returns today for an evaluation.  REVIEW OF SYSTEMS: Out of a complete 14 system review of symptoms, the patient complains only of the following symptoms, and all other reviewed systems are negative.  ALLERGIES: Allergies  Allergen Reactions  . Axona [Bacid] Other (See Comments)    Unknown per MAR  . Gabapentin Other (See Comments)    Hallucinations    HOME MEDICATIONS: Facility-Administered Medications Prior to Visit  Medication Dose Route Frequency Provider Last Rate Last Dose  . albuterol (PROVENTIL) (2.5 MG/3ML) 0.083% nebulizer solution 2.5 mg  2.5 mg Nebulization Q4H PRN Dessa Phi, DO   2.5 mg at 05/21/18 2200  . buPROPion [facility] SR) 12 hr tablet 100 mg  100 mg Oral Daily Jennette Kettle M, DO   100 mg at 05/25/18 1040  . docusate sodium (COLACE) capsule 100 mg  100 mg Oral Daily Jennette Kettle M, DO   100 mg at 05/24/18 1101  . donepezil (ARICEPT) tablet 23 mg  23 mg Oral Daily Jennette Kettle M, DO   23 mg at 05/25/18 1040  . dorzolamide-timolol (COSOPT) 22.3-6.8  MG/ML ophthalmic solution 1 drop  1 drop Left Eye BID Jennette Kettle M, DO   1 drop at 05/25/18 2231  . ferrous sulfate tablet 325 mg  325 mg Oral Q breakfast Etta Quill, DO   325 mg at 05/25/18 1046  . free water 200 mL  200 mL Oral Q8H Dessa Phi, DO   200 mL at 05/26/18 1696  . furosemide (LASIX) injection 20 mg  20 mg Intravenous Daily Debbe Odea, MD   20 mg at 05/25/18 1047  . insulin aspart (novoLOG) injection 0-9 Units  0-9 Units Subcutaneous TID WC Dessa Phi, DO   2 Units at 05/25/18 1752  . MEDLINE mouth rinse  15 mL Mouth Rinse BID Debbe Odea, MD   15 mL at 05/25/18 2232  . memantine (NAMENDA XR) 24 hr capsule 28 mg  28 mg Oral Daily Alcario Drought, Jared M, DO   28 mg at 05/25/18 1040  . naphazoline-glycerin (CLEAR EYES REDNESS) ophth solution 1-2 drop  1-2 drop Right Eye QID PRN Etta Quill, DO      . pantoprazole (PROTONIX) EC tablet 40 mg  40 mg Oral Daily Debbe Odea, MD   40 mg at 05/25/18 1046  . piperacillin-tazobactam (ZOSYN) IVPB 3.375 g  3.375 g Intravenous Q8H Rizwan, Saima, MD 12.5 mL/hr at 05/26/18 0520 3.375 g at 05/26/18 0520  . polyvinyl alcohol (LIQUIFILM  TEARS) 1.4 % ophthalmic solution 1 drop  1 drop Both Eyes PRN Dana Allan I, MD   1 drop at 05/20/18 2131  . RESOURCE THICKENUP CLEAR   Oral PRN Dana Allan I, MD      . simvastatin (ZOCOR) tablet 5 mg  5 mg Oral q1800 Etta Quill, DO   5 mg at 05/25/18 1752   Outpatient Medications Prior to Visit  Medication Sig Dispense Refill  . albuterol (PROVENTIL) (2.5 MG/3ML) 0.083% nebulizer solution Take 3 mLs (2.5 mg total) by nebulization every 4 (four) hours as needed for wheezing or shortness of breath. 75 mL 12  . amoxicillin-clavulanate (AUGMENTIN) 875-125 MG tablet Take 1 tablet by mouth every 12 (twelve) hours for 10 days. 4 tablet 0  . buPROPion (WELLBUTRIN SR) 100 MG 12 hr tablet Take 100 mg by mouth daily.    . carvedilol (COREG) 3.125 MG tablet Take 1 tablet (3.125 mg total) by  mouth 2 (two) times daily with a meal. 60 tablet 1  . Cholecalciferol (VITAMIN D) 1000 UNITS capsule Take 1,000 Units by mouth daily.      Marland Kitchen docusate sodium (COLACE) 100 MG capsule Take 100 mg by mouth daily.    Marland Kitchen donepezil (ARICEPT) 23 MG TABS tablet Take 1 tablet (23 mg total) by mouth daily. 90 tablet 3  . dorzolamide-timolol (COSOPT) 22.3-6.8 MG/ML ophthalmic solution Place 1 drop into the left eye 2 (two) times daily.     . ferrous sulfate 325 (65 FE) MG tablet Take 325 mg by mouth daily with breakfast.    . Flaxseed, Linseed, (FLAXSEED OIL) 1000 MG CAPS Take 1,000 mg by mouth daily.      . folic acid (FOLVITE) 154 MCG tablet Take 800 mcg by mouth every evening.     . furosemide (LASIX) 20 MG tablet Take 1 tablet (20 mg total) by mouth daily. 30 tablet 1  . insulin glargine (LANTUS) 100 UNIT/ML injection Inject 10 Units into the skin at bedtime.     Marland Kitchen lisinopril (PRINIVIL,ZESTRIL) 5 MG tablet Take 1 tablet (5 mg total) by mouth daily. 30 tablet 1  . memantine (NAMENDA XR) 28 MG CP24 24 hr capsule Take 1 capsule (28 mg total) by mouth daily. 90 capsule 3  . metFORMIN (GLUCOPHAGE) 500 MG tablet Take 500 mg by mouth 2 (two) times daily with a meal.    . Multiple Vitamin (MULTIVITAMIN) tablet Take 1 tablet by mouth daily.      . pantoprazole (PROTONIX) 40 MG tablet Take 1 tablet (40 mg total) by mouth daily.    . Probiotic Product (ALIGN) 4 MG CAPS Take 4 mg by mouth daily.    . simvastatin (ZOCOR) 5 MG tablet Take 5 mg by mouth daily.    . sitaGLIPtin (JANUVIA) 25 MG tablet Take 1 tablet (25 mg total) by mouth daily.      PAST MEDICAL HISTORY: Past Medical History:  Diagnosis Date  . Abnormality of gait   . Backache, unspecified   . CHF (congestive heart failure) ([place])   . Colon polyps    adenomatous  . Dementia ([place]) 11/12/2012  . Depression   . Diabetes mellitus without complication ([place])   . Diverticulosis   . Essential and other specified forms of tremor   . Hemorrhoids   .  History of bilateral hip replacements 11/12/2012  . Memory loss   . 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 Pain in joint, pelvic region and thigh   .  Skin cancer of scalp   . Spinal stenosis, lumbar region, without neurogenic claudication   . Unspecified hereditary and idiopathic peripheral neuropathy     PAST SURGICAL HISTORY: Past Surgical History:  Procedure Laterality Date  . CATARACT EXTRACTION, BILATERAL  2012  . JOINT REPLACEMENT    . RETINAL LASER PROCEDURE  2012   retinal wrinkle  . SKIN CANCER EXCISION    . TOTAL HIP ARTHROPLASTY Left 2000  . TOTAL HIP ARTHROPLASTY (aka REPLACEMENT) Right 2011    FAMILY HISTORY: Family History  Problem Relation Age of Onset  . Heart failure Mother     SOCIAL HISTORY: Social History   Socioeconomic History  . Marital status: Married    Spouse name: [name]  . Number of children: 2  . Years of education: Chief Executive Officer  . Highest education level: Not on file  Occupational History  . 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 Financial resource strain: Not hard at all  . Food insecurity:    Worry: Never true    Inability: Never true  . Transportation needs:    Medical: No    Non-medical: No  Tobacco Use  . Smoking status: Former Smoker    Types: Cigarettes  . Smokeless tobacco: Never Used  Substance and Sexual Activity  . Alcohol use: Yes    Alcohol/week: 14.0 standard drinks    Types: 14 Glasses of wine per week    Comment: occas, one glass of wine before dinner,sometimes 1/2 glass  . Drug use: No  . Sexual activity: Never  Lifestyle  . Physical activity:    Days per week: 0 days    Minutes per session: 0 min  . Stress: Only a little  Relationships  . Social connections:    Talks on phone: Never    Gets together: More than three times a week    Attends religious service: Never    Active member of club or organization: No    Attends  meetings of clubs or organizations: Never    Relationship status: Married  . Intimate partner violence:    Fear of current or ex partner: No    Emotionally abused: No    Physically abused: No    Forced sexual activity: No  Other Topics Concern  . Not on file  Social History Narrative   Patient is right handed and resides in home with wife      PHYSICAL EXAM  There were no vitals filed for this visit. There is no height or weight on file to calculate BMI.  Generalized: Well developed, in no acute distress  Cardiology: normal rate and rhythm, no murmur noted Neurological examination  Mentation: Alert oriented to time, place, history taking. Follows all commands speech and language fluent Cranial nerve II-XII: Pupils were equal round reactive to light. Extraocular movements were full, visual field were full on confrontational test. Facial sensation and strength were normal. Uvula tongue midline. Head turning and shoulder shrug  were normal and symmetric. Motor: The motor testing reveals 5 over 5 strength of all 4 extremities. Good symmetric motor tone is noted throughout.  Sensory: Sensory testing is intact to soft touch on all 4 extremities. No evidence of extinction is noted.  Coordination: Cerebellar testing reveals good finger-nose-finger and heel-to-shin bilaterally.  Gait and station: Gait is normal. Tandem gait is normal. Romberg is negative. No drift is seen.  Reflexes: Deep tendon reflexes are symmetric and normal bilaterally.  DIAGNOSTIC DATA (LABS, IMAGING, TESTING) - I reviewed patient records, labs, notes, testing and imaging myself where available.  MMSE - Mini Mental State Exam 12/13/2017 10/14/2017 12/07/2016  Orientation to time 0 1 0  Orientation to Place 3 3 3   Registration 3 3 3   Attention/ Calculation 0 0 3  Recall 0 0 0  Language- name 2 objects 2 2 2   Language- repeat 1 1 1   Language- follow 3 step command 3 3 3   Language- read & follow direction 1 1 1     Write a sentence 1 1 1   Copy design 0 1 1  Total score 14 16 18      Lab Results  Component Value Date   WBC 9.4 05/22/2018   HGB 9.0 (L) 05/22/2018   HCT 30.8 (L) 05/22/2018   MCV 95.7 05/22/2018   PLT 142 (L) 05/22/2018      Component Value Date/Time   NA 148 (H) 05/23/2018 0218   NA 144 04/10/2018   NA 140 11/14/2017   NA 140 11/14/2017   K 3.1 (L) 05/23/2018 0218   K 4.0 11/14/2017   K 4.0 11/14/2017   CL 110 05/23/2018 0218   CL 110 04/10/2018   CO2 29 05/23/2018 0218   CO2 22 04/10/2018   GLUCOSE 157 (H) 05/23/2018 0218   BUN 19 05/23/2018 0218   BUN 24 (A) 04/10/2018   CREATININE 1.01 05/23/2018 0218   CREATININE 0.93 11/14/2017   CREATININE 0.93 11/14/2017   CALCIUM 8.4 (L) 05/23/2018 0218   CALCIUM 8.9 04/10/2018   PROT 5.4 (L) 05/11/2018 1626   PROT 6.4 04/10/2018   ALBUMIN 2.0 (L) 05/13/2018 0251   ALBUMIN 3.7 04/10/2018   AST 37 05/11/2018 1626   AST 17 08/08/2017   ALT 27 05/11/2018 1626   ALT 22 08/08/2017   ALT 14 04/18/2017   ALKPHOS 86 05/11/2018 1626   ALKPHOS 109 08/08/2017   BILITOT 0.5 05/11/2018 1626   BILITOT 0.5 08/08/2017   GFRNONAA >60 05/23/2018 0218   GFRNONAA 51 04/10/2018   GFRAA >60 05/23/2018 0218   Lab Results  Component Value Date   CHOL 150 09/05/2017   HDL 49 01/31/2017   LDLCALC 89 09/05/2017   TRIG 113 09/05/2017   Lab Results  Component Value Date   HGBA1C 7.7 12/03/2017   No results found for: VITAMINB12 Lab Results  Component Value Date   TSH 2.61 08/08/2017       ASSESSMENT AND PLAN 85 y.o. year old male  has a past medical history of Abnormality of gait, Backache, unspecified, CHF (congestive heart failure) ([place]), Colon polyps, Dementia ([place]) (11/12/2012), Depression, Diabetes mellitus without complication ([place]), Diverticulosis, Essential and other specified forms of tremor, Hemorrhoids, History of bilateral hip replacements (11/12/2012), Memory loss, Other persistent mental disorders due to conditions  classified elsewhere, Pain in joint, pelvic region and thigh, Skin cancer of scalp, Spinal stenosis, lumbar region, without neurogenic claudication, and Unspecified hereditary and idiopathic peripheral neuropathy. here with ***  No diagnosis found.     No orders of the defined types were placed in this encounter.    No orders of the defined types were placed in this encounter.     I spent 15 minutes with the patient. 50% of this time was spent counseling and educating patient on plan of care and medications.    Debbora Presto, FNP-C 05/26/2018, 10:16 AM [facility] [address], [place], [place] [address] [phone]

## 2018-05-27 ENCOUNTER — Ambulatory Visit: Payer: Medicare Other | Admitting: Podiatry

## 2018-05-28 ENCOUNTER — Encounter: Payer: Self-pay | Admitting: Internal Medicine

## 2018-05-28 ENCOUNTER — Non-Acute Institutional Stay (SKILLED_NURSING_FACILITY): Payer: Medicare Other | Admitting: Internal Medicine

## 2018-05-28 DIAGNOSIS — J690 Pneumonitis due to inhalation of food and vomit: Secondary | ICD-10-CM | POA: Diagnosis not present

## 2018-05-28 DIAGNOSIS — R1310 Dysphagia, unspecified: Secondary | ICD-10-CM

## 2018-05-28 DIAGNOSIS — J9611 Chronic respiratory failure with hypoxia: Secondary | ICD-10-CM | POA: Diagnosis not present

## 2018-05-28 DIAGNOSIS — I5042 Chronic combined systolic (congestive) and diastolic (congestive) heart failure: Secondary | ICD-10-CM

## 2018-05-28 DIAGNOSIS — D62 Acute posthemorrhagic anemia: Secondary | ICD-10-CM | POA: Diagnosis not present

## 2018-05-28 DIAGNOSIS — I130 Hypertensive heart and chronic kidney disease with heart failure and stage 1 through stage 4 chronic kidney disease, or unspecified chronic kidney disease: Secondary | ICD-10-CM | POA: Diagnosis not present

## 2018-05-28 DIAGNOSIS — E1122 Type 2 diabetes mellitus with diabetic chronic kidney disease: Secondary | ICD-10-CM | POA: Diagnosis not present

## 2018-05-28 DIAGNOSIS — J9600 Acute respiratory failure, unspecified whether with hypoxia or hypercapnia: Secondary | ICD-10-CM | POA: Diagnosis not present

## 2018-05-28 DIAGNOSIS — J9601 Acute respiratory failure with hypoxia: Secondary | ICD-10-CM | POA: Diagnosis not present

## 2018-05-28 DIAGNOSIS — K922 Gastrointestinal hemorrhage, unspecified: Secondary | ICD-10-CM

## 2018-05-28 DIAGNOSIS — Z9981 Dependence on supplemental oxygen: Secondary | ICD-10-CM | POA: Diagnosis not present

## 2018-05-28 DIAGNOSIS — N189 Chronic kidney disease, unspecified: Secondary | ICD-10-CM | POA: Diagnosis not present

## 2018-05-28 DIAGNOSIS — R1312 Dysphagia, oropharyngeal phase: Secondary | ICD-10-CM | POA: Diagnosis not present

## 2018-05-28 LAB — CBC AND DIFFERENTIAL
HCT: 32 — AB (ref 41–53)
Hemoglobin: 10.1 — AB (ref 13.5–17.5)
Platelets: 180 (ref 150–399)
WBC: 8.2

## 2018-05-28 LAB — BASIC METABOLIC PANEL
BUN: 12 (ref 4–21)
CREATININE: 0.8 (ref 0.6–1.3)
Glucose: 164
Potassium: 3.5 (ref 3.4–5.3)
Sodium: 140 (ref 137–147)

## 2018-05-28 LAB — HEPATIC FUNCTION PANEL
ALT: 13 (ref 10–40)
AST: 12 — AB (ref 14–40)
Alkaline Phosphatase: 96 (ref 25–125)
Bilirubin, Total: 0.2

## 2018-05-28 NOTE — Progress Notes (Signed)
Location:  [facility] Room Number: [id number] Place of Service:  SNF [phone]) Provider:    [name], MD  Patient Care Team: [name], MD as PCP - General (Internal Medicine) [name], MD as PCP - Cardiology (Cardiology) Mast, Man X, NP as Nurse Practitioner (Internal Medicine)  Extended Emergency Contact Information Primary Emergency Contact: [name] Address: [place], [place] Phone: [phone] Mobile Phone: [phone] Relation: Spouse Secondary Emergency Contact: [name] [place] Phone: [phone] Work Phone: [phone] Relation: Daughter  Code Status:  DNR Goals of care: Advanced Directive information Advanced Directives 05/28/2018  Does Patient Have a Medical Advance Directive? Yes  Type of Paramedic of [place];Living will  Does patient want to make changes to medical advance directive? No - Patient declined  Copy of [place] in Chart? Yes - validated most recent copy scanned in chart (See row information)  Would patient like information on creating a medical advance directive? -  Pre-existing out of facility DNR order (yellow form or pink MOST form) -     Chief Complaint  Patient presents with  . Readmit To SNF    readmit to facility     HPI:  Pt is a 85 y.o. male seen today for an acute visit for Readmission.  Patient is long term Resident of SNF facility. He was admitted in the hospital from 02/09-02/24 for Aspiration Pneumonia with respiratory Failure and Acute Blood loss Anemia.  He was also in Hospital from 01/24-01/29 for Aspiration Pneumonia.with Respiratory failure He came to facility and was again send to the hospital on 01/30 for episode of Choking and Coughing.He was discharged on 02/03 and then send again on 02/09.  Patient has a history of type 2 diabetes, Advanced dementia,  hypertension, systolic CHF, depression, anemia  Patient continues to have Aspiration leading to Pneumonia and respiratory failure. He was send to ED on 02/09 for SOB and Hypoxia after eating his breakfast that morning.  While he was getting treatment for pneumonia he was found to have acute drop in his hemoglobin to 5.5.  GI recommended EGD after transfusion of 2 units.  But patient developed respiratory distress due to pulmonary edema.  An EGD was not done. He was treated with antibiotics Zosyn.  He was also given Lasix.  He was on BiPAP and eventually was tapered off to 2 L of oxygen.  He is discharged on 10 days of Augmentin. Patient was started on Protonix and oral iron for his bleed.  Plan is to continue follow his hemoglobin. Patient is back in the facility on 2 L of oxygen.  Per his caregiver sitting in the room patient continues to have cough.  Patient unable to give me any history due to his advanced dementia.   Past Medical History:  Diagnosis Date  . Abnormality of gait   . Backache, unspecified   . CHF (congestive heart failure) ([place])   . Colon polyps    adenomatous  . Dementia ([place]) 11/12/2012  . Depression   . Diabetes mellitus without complication ([place])   . Diverticulosis   . Essential and other specified forms of tremor   . Hemorrhoids   . History of bilateral hip replacements 11/12/2012  . Memory loss   . 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 Pain in joint, pelvic region  and thigh   . Skin cancer of scalp   . Spinal stenosis, lumbar region, without neurogenic claudication   . Unspecified hereditary and idiopathic peripheral neuropathy    Past Surgical History:  Procedure Laterality Date  . CATARACT EXTRACTION, BILATERAL  2012  . JOINT REPLACEMENT    . RETINAL LASER PROCEDURE  2012   retinal wrinkle  . SKIN CANCER EXCISION    . TOTAL HIP ARTHROPLASTY Left 2000  . TOTAL HIP ARTHROPLASTY (aka REPLACEMENT) Right 2011     Allergies  Allergen Reactions  . Axona [Bacid] Other (See Comments)    Unknown per MAR  . Gabapentin Other (See Comments)    Hallucinations    Outpatient Encounter Medications as of 05/28/2018  Medication Sig  . albuterol (PROVENTIL) (2.5 MG/3ML) 0.083% nebulizer solution Take 3 mLs (2.5 mg total) by nebulization every 4 (four) hours as needed for wheezing or shortness of breath.  Marland Kitchen amoxicillin-clavulanate (AUGMENTIN) 875-125 MG tablet Take 1 tablet by mouth every 12 (twelve) hours for 10 days.  Marland Kitchen buPROPion (WELLBUTRIN SR) 100 MG 12 hr tablet Take 100 mg by mouth daily.  . carvedilol (COREG) 3.125 MG tablet Take 1 tablet (3.125 mg total) by mouth 2 (two) times daily with a meal.  . Cholecalciferol (VITAMIN D) 1000 UNITS capsule Take 1,000 Units by mouth daily.    Marland Kitchen docusate sodium (COLACE) 100 MG capsule Take 100 mg by mouth daily.  Marland Kitchen donepezil (ARICEPT) 23 MG TABS tablet Take 1 tablet (23 mg total) by mouth daily.  . dorzolamide-timolol (COSOPT) 22.3-6.8 MG/ML ophthalmic solution Place 1 drop into the left eye 2 (two) times daily.   . ferrous sulfate 325 (65 FE) MG tablet Take 325 mg by mouth daily with breakfast.  . Flaxseed, Linseed, (FLAXSEED OIL) 1000 MG CAPS Take 1,000 mg by mouth daily.    . folic acid (FOLVITE) 332 MCG tablet Take 800 mcg by mouth every evening.   . furosemide (LASIX) 20 MG tablet Take 1 tablet (20 mg total) by mouth daily.  . memantine (NAMENDA XR) 28 MG CP24 24 hr capsule Take 1 capsule (28 mg total) by mouth daily.  . metFORMIN (GLUCOPHAGE) 500 MG tablet Take 500 mg by mouth 2 (two) times daily with a meal.  . Multiple Vitamin (MULTIVITAMIN) tablet Take 1 tablet by mouth daily.    . pantoprazole (PROTONIX) 40 MG tablet Take 1 tablet (40 mg total) by mouth daily.  . Probiotic Product (ALIGN) 4 MG CAPS Take 4 mg by mouth daily.  . simvastatin (ZOCOR) 5 MG tablet Take 5 mg by mouth daily.  Marland Kitchen zinc oxide 20 % ointment Apply 1 application topically as needed for  irritation. To buttocks after every incontinent episode and as needed for redness   No facility-administered encounter medications on file as of 05/28/2018.     Review of Systems  Unable to perform ROS: Dementia    Immunization History  Administered Date(s) Administered  . Influenza-Unspecified 01/10/2017, 01/13/2018  . PPD Test 06/24/2016  . Pneumococcal-Unspecified 06/26/2012  . Td 12/02/2011  . Zoster Recombinat (Shingrix) 06/10/2017, 08/12/2017   Pertinent  Health Maintenance Due  Topic Date Due  . OPHTHALMOLOGY EXAM  06/06/1940  . URINE MICROALBUMIN  06/06/1940  . HEMOGLOBIN A1C  06/03/2018  . FOOT EXAM  08/07/2018  . INFLUENZA VACCINE  Completed  . PNA vac Low Risk Adult  Completed   Fall Risk  12/13/2017 10/14/2017 12/07/2016 01/19/2016 07/21/2015  Falls in the past year? No No No Yes Yes  Number falls in past yr: - - - 1 1  Injury with Fall? - - - No No  Risk for fall due to : - Impaired balance/gait - Impaired balance/gait -  Follow up - - - Falls prevention discussed Falls prevention discussed   Functional Status Survey:    Vitals:   05/28/18 0833  BP: 130/72  Pulse: 68  Resp: 18  Temp: (!) 97.5 F (36.4 C)  SpO2: 95%  Weight: 166 lb (75.3 kg)  Height: 6\' 1"  (1.854 m)   Body mass index is 21.9 kg/m. Physical Exam Vitals signs and nursing note reviewed.  Constitutional:      Appearance: Normal appearance.  HENT:     Head: Normocephalic.     Nose: Nose normal.     Mouth/Throat:     Mouth: Mucous membranes are moist.     Pharynx: Oropharynx is clear.  Eyes:     Pupils: Pupils are equal, round, and reactive to light.  Neck:     Musculoskeletal: Neck supple.  Cardiovascular:     Rate and Rhythm: Normal rate and regular rhythm.     Pulses: Normal pulses.     Heart sounds: Normal heart sounds. No murmur.  Pulmonary:     Effort: Pulmonary effort is normal. No respiratory distress.     Breath sounds: Normal breath sounds. No wheezing or rales.    Abdominal:     General: Abdomen is flat. There is no distension.     Palpations: Abdomen is soft.     Tenderness: There is no abdominal tenderness. There is no guarding.  Musculoskeletal:     Comments: Trace swelling Bilateral  Skin:    Comments: Some redness in the Antecubital area  Neurological:     General: No focal deficit present.     Mental Status: He is alert.     Comments: Patient follows simple Commands. But just mumble few words. And went back to sleep  Psychiatric:        Mood and Affect: Mood normal.        Behavior: Behavior normal.     Labs reviewed: Recent Labs    04/26/18 0316  05/13/18 0251  05/19/18 0306  05/21/18 0338 05/22/18 1129 05/23/18 0218  NA 148*   < > 142   < > 150*   < > 148* 146* 148*  K 3.7   < > 4.1   < > 3.7   < > 3.5 3.5 3.1*  CL 115*   < > 115*   < > 117*   < > 117* 113* 110  CO2 26   < > 21*   < > 28   < > 26 25 29   GLUCOSE 168*   < > 104*   < > 152*   < > 157* 217* 157*  BUN 34*   < > 18   < > 20   < > 19 20 19   CREATININE 1.32*   < > 1.00   < > 0.97   < > 0.92 1.01 1.01  CALCIUM 8.2*   < > 7.8*   < > 8.0*   < > 8.0* 8.1* 8.4*  MG 2.2  --  2.1  --  2.1  --   --   --   --   PHOS 3.3  --  3.1  --   --   --   --   --   --    < > = values in this  interval not displayed.   Recent Labs    04/27/18 0321 05/01/18 0034 05/11/18 1626 05/13/18 0251  AST 44* 47* 37  --   ALT 40 60* 27  --   ALKPHOS 91 111 86  --   BILITOT 0.5 0.6 0.5  --   PROT 5.5* 6.2* 5.4*  --   ALBUMIN 2.5* 2.6* 2.5* 2.0*   Recent Labs    05/11/18 1626 05/13/18 0251 05/13/18 0505  05/20/18 0255 05/21/18 0338 05/22/18 1129  WBC 15.6* 7.8 8.2   < > 8.8 7.4 9.4  NEUTROABS 13.2* 5.1 5.7  --   --   --   --   HGB 7.6* 5.5* 5.5*   < > 8.7* 8.1* 9.0*  HCT 26.0* 19.5* 19.1*   < > 30.1* 28.5* 30.8*  MCV 91.2 92.9 92.7   < > 96.2 95.6 95.7  PLT 182 130* 126*   < > 155 132* 142*   < > = values in this interval not displayed.   Lab Results  Component Value Date    TSH 2.61 08/08/2017   Lab Results  Component Value Date   HGBA1C 7.7 12/03/2017   Lab Results  Component Value Date   CHOL 150 09/05/2017   HDL 49 01/31/2017   LDLCALC 89 09/05/2017   TRIG 113 09/05/2017    Significant Diagnostic Results in last 30 days:  Dg Chest 2 View  Result Date: 05/11/2018 CLINICAL DATA:  Possible pneumonia EXAM: CHEST - 2 VIEW COMPARISON:  Chest radiograph 05/01/2018 FINDINGS: Monitoring leads overlie the patient. Stable cardiac and mediastinal contours. Low lung volumes. Bibasilar heterogeneous pulmonary opacities. No pleural effusion or pneumothorax. Thoracic spine degenerative changes. Elevation right hemidiaphragm. IMPRESSION: Right-greater-than-left basilar opacities favored to represent atelectasis. Electronically Signed   By: Lovey Newcomer M.D.   On: 05/11/2018 17:15   Ct Chest W Contrast  Result Date: 05/22/2018 CLINICAL DATA:  SOB per pt chart; Pt is totally non-verbal, denies chest pain EXAM: CT CHEST WITH CONTRAST TECHNIQUE: Multidetector CT imaging of the chest was performed during intravenous contrast administration. CONTRAST:  80mL OMNIPAQUE IOHEXOL 300 MG/ML  SOLN COMPARISON:  11/28/2016 FINDINGS: Cardiovascular: Heart size upper limits normal. The RV is nondilated. No pleural effusion. Coronary and aortic calcifications without aneurysm. Mediastinum/Nodes: No pathologically enlarged mediastinal lymph nodes. Calcified bilateral hilar lymph nodes. Lungs/Pleura: Small pleural effusions right greater than left. No pneumothorax. Patchy airspace opacities in the right upper lobe primarily apical and posterior segments, to a lesser degree in the inferomedial lingula and posterior left upper lobe. Dependent atelectasis posteriorly in both lower lobes. Upper Abdomen: No acute findings. Moderate gastric distention. Musculoskeletal: Anterior vertebral endplate spurring at multiple levels in the mid and lower thoracic spine. Spondylitic changes in the lower cervical  spine. IMPRESSION: 1. Patchy airspace opacities in the right upper lobe, lingula, and posterior left upper lobe may represent pneumonia or atypical pulmonary edema. 2. Small pleural effusions right greater than left. 3. Coronary and Aortic Atherosclerosis (ICD10-I70.0). Electronically Signed   By: Lucrezia Europe M.D.   On: 05/22/2018 10:37   Dg Chest Port 1 View  Result Date: 05/21/2018 CLINICAL DATA:  85 y/o  M; shortness of breath and crackles. EXAM: PORTABLE CHEST 1 VIEW COMPARISON:  05/17/2018 chest radiograph FINDINGS: Normal cardiac silhouette. Aortic atherosclerosis with calcification. Diffusely increased reticular and patchy airspace opacities greatest in the right lung. No pleural effusion or pneumothorax. No acute osseous abnormality is evident. IMPRESSION: Diffusely increased reticular and patchy airspace opacities greatest in the right  lung probably represents interstitial and alveolar pulmonary edema. Underlying pneumonia is not excluded. Electronically Signed   By: Kristine Garbe M.D.   On: 05/21/2018 22:39   Dg Chest Port 1 View  Result Date: 05/17/2018 CLINICAL DATA:  Acute hypoxemic respiratory failure. History of congestive heart failure and diabetes. EXAM: PORTABLE CHEST 1 VIEW COMPARISON:  Radiographs 05/15/2018 and 05/14/2018. FINDINGS: 0801 hours. Two views submitted. The heart size and mediastinal contours are stable. There are fluctuating bilateral airspace opacities with current components in both lower lobes and the right upper lobe, similar to the most recent study. Pulmonary vascularity appears normal. There are probable small bilateral pleural effusions. No pneumothorax or acute osseous findings. IMPRESSION: Unchanged bilateral airspace opacities compared with most recent study of 2 days ago. These findings likely represent a combination of atelectasis and inflammation. No definite residual edema. Electronically Signed   By: Richardean Sale M.D.   On: 05/17/2018 10:08    Dg Chest Port 1 View  Result Date: 05/15/2018 CLINICAL DATA:  Initial evaluation for acute increase in shortness of breath. EXAM: PORTABLE CHEST 1 VIEW COMPARISON:  Prior radiograph from earlier the same day. FINDINGS: Stable cardiomegaly.  Mediastinal silhouette within normal limits. Lungs hypoinflated with elevation of right hemidiaphragm, similar. Dense retrocardiac left lower opacity most compatible with atelectasis. Increased confluent opacity at the inferior right upper lobe, which could reflect atelectasis and/or worsening/new infiltrate. Mildly increased right basilar atelectasis. Underlying diffuse pulmonary interstitial edema with scattered Kerley B-lines, slightly worsened. Small bilateral pleural effusions. No pneumothorax. Osseous structures unchanged. IMPRESSION: 1. Slight interval worsening in mild diffuse pulmonary interstitial edema. Small bilateral pleural effusions. 2. Increased confluent right perihilar opacity, which could reflect progressive atelectasis and/or infiltrate. 3. Persistent bibasilar atelectasis. Electronically Signed   By: Jeannine Boga M.D.   On: 05/15/2018 23:40   Dg Chest Port 1 View  Result Date: 05/15/2018 CLINICAL DATA:  Acute respiratory failure EXAM: PORTABLE CHEST 1 VIEW COMPARISON:  05/14/2018 FINDINGS: Cardiomegaly. Improving bilateral opacities. Continued mild residual right perihilar opacities and bibasilar atelectasis. Suspect small layering effusions. No acute bony abnormality. IMPRESSION: Improving bilateral airspace disease. Continued right perihilar opacities and bibasilar atelectasis. Question small effusions. Electronically Signed   By: Rolm Baptise M.D.   On: 05/15/2018 03:32   Dg Chest Port 1 View  Result Date: 05/14/2018 CLINICAL DATA:  Acute respiratory failure with shortness of breath EXAM: PORTABLE CHEST 1 VIEW COMPARISON:  Yesterday FINDINGS: Worsening interstitial opacity with Kerley lines. Low volume chest with increased hazy and  streaky density on the right. Borderline heart size. Negative aortic and mediastinal contours. IMPRESSION: 1. Pulmonary edema. 2. Increased asymmetric right lung opacity that could be atelectasis or infection. Lung volumes are low. Electronically Signed   By: Monte Fantasia M.D.   On: 05/14/2018 04:41   Dg Chest Port 1 View  Result Date: 05/13/2018 CLINICAL DATA:  Pneumonia. EXAM: PORTABLE CHEST 1 VIEW COMPARISON:  Radiograph May 11, 2018. FINDINGS: Stable cardiomegaly. Atherosclerosis of thoracic aorta is noted. Stable elevated right hemidiaphragm is noted. Mild bibasilar subsegmental atelectasis is noted. No pneumothorax or pleural effusion is noted. Bony thorax is unremarkable. IMPRESSION: Mild bibasilar subsegmental atelectasis. Aortic Atherosclerosis (ICD10-I70.0). Electronically Signed   By: Marijo Conception, M.D.   On: 05/13/2018 08:51   Dg Chest Portable 1 View  Result Date: 05/01/2018 CLINICAL DATA:  Shortness of breath. Recent left lower lobe pneumonia. EXAM: PORTABLE CHEST 1 VIEW COMPARISON:  Radiographs 3 days ago 04/28/2018, additional priors. FINDINGS: No significant change in  left lung base opacity from prior exam. Unchanged right perihilar atelectasis. Unchanged heart size and mediastinal contours. Unchanged low lung volumes. Mild basilar septal thickening and peribronchial thickening suspicious for mild pulmonary edema. No pneumothorax. IMPRESSION: 1. No significant change in left lung base opacity from prior exam. Unchanged right perihilar atelectasis. 2. Mild basilar septal and peribronchial thickening, suspicious for pulmonary edema. Electronically Signed   By: Keith Rake M.D.   On: 05/01/2018 00:52   Dg Swallowing Func-speech Pathology  Result Date: 04/29/2018 Objective Swallowing Evaluation: Type of Study: MBS-Modified Barium Swallow Study  Patient Details Name: ADONAI SELSOR MRN: [id number] Date of Birth: 05/29/1930 Today's Date: 04/29/2018 Time: SLP Start Time (ACUTE ONLY):  1210 -SLP Stop Time (ACUTE ONLY): 1230 SLP Time Calculation (min) (ACUTE ONLY): 20 min Past Medical History: Past Medical History: Diagnosis Date . Abnormality of gait  . Backache, unspecified  . CHF (congestive heart failure) ([place])  . Colon polyps   adenomatous . Dementia ([place]) 11/12/2012 . Depression  . Diabetes mellitus without complication ([place])  . Diverticulosis  . Essential and other specified forms of tremor  . Hemorrhoids  . History of bilateral hip replacements 11/12/2012 . Memory loss  . Other persistent mental disorders due to conditions classified elsewhere  . Pain in joint, pelvic region and thigh  . Skin cancer of scalp  . Spinal stenosis, lumbar region, without neurogenic claudication  . Unspecified hereditary and idiopathic peripheral neuropathy  Past Surgical History: Past Surgical History: Procedure Laterality Date . CATARACT EXTRACTION, BILATERAL  2012 . JOINT REPLACEMENT   . RETINAL LASER PROCEDURE  2012  retinal wrinkle . SKIN CANCER EXCISION   . TOTAL HIP ARTHROPLASTY Left 2000 . TOTAL HIP ARTHROPLASTY (aka REPLACEMENT) Right 7431 HPI: 85 year old man with history of CHF, dementia, DM, HTN, iron deficiency anemia presenting with altered mental status. Unable to obtain history from patient due to acute encephalopathy and underlying dementia. Marland Kitchen He has been having respiratory infection symptoms for the past 1.5 to 2 weeks, including cough that sounds productive. Patient had MBS 03/2017 with mild oropharyngeal dysphagia and discharged with Dys 1, thin liquid diet.  Subjective: alert, cooperative Assessment / Plan / Recommendation CHL IP CLINICAL IMPRESSIONS 04/29/2018 Clinical Impression Patient has mild oropharyngeal dysphagia. Thin liquids with cup sip were aspirated silently when bolus presented by cup, by examiner (trace). However no penetration or aspiration were noted when patient administered bolus himself. He had a natural chin tuck, that he initiated after the swallow that did not impact the  swallow negatively or positively. All swallows were delayed, initiated at the valleculae with all consistencies and volumes. When patient was administered bolus by examiner, a larger bolus was given and the swallow was initiated at the pyriform sinuses and that is also when the silent aspiration occured. Patient has mild to moderated residue after all bolus types and sizes. Recommend a Regular diet, thin liquids with small bolus sizes. Self administered if possible, followed by a second swallow to attempt to clear residue. Medication whole in puree. Assistence of staff with meals to ensrue second swallow and bolus size/portion. SLP Visit Diagnosis Dysphagia, oropharyngeal phase (R13.12) Attention and concentration deficit following -- Frontal lobe and executive function deficit following -- Impact on safety and function Mild aspiration risk   CHL IP TREATMENT RECOMMENDATION 04/29/2018 Treatment Recommendations Therapy as outlined in treatment plan below   Prognosis 04/29/2018 Prognosis for Safe Diet Advancement Good Barriers to Reach Goals Cognitive deficits Barriers/Prognosis Comment -- CHL IP DIET RECOMMENDATION 04/29/2018 SLP  Diet Recommendations Regular solids;Thin liquid Liquid Administration via Cup;No straw Medication Administration Whole meds with puree Compensations Minimize environmental distractions;Small sips/bites;Slow rate;Multiple dry swallows after each bite/sip Postural Changes Remain semi-upright after after feeds/meals (Comment);Seated upright at 90 degrees   CHL IP OTHER RECOMMENDATIONS 04/29/2018 Recommended Consults -- Oral Care Recommendations Oral care BID Other Recommendations --   CHL IP FOLLOW UP RECOMMENDATIONS 03/31/2017 Follow up Recommendations Skilled Nursing facility   [facility] IP FREQUENCY AND DURATION 04/29/2018 Speech Therapy Frequency (ACUTE ONLY) min 2x/week Treatment Duration 2 weeks      CHL IP ORAL PHASE 04/29/2018 Oral Phase WFL Oral - Pudding Teaspoon -- Oral - Pudding Cup -- Oral -  Honey Teaspoon -- Oral - Honey Cup -- Oral - Nectar Teaspoon -- Oral - Nectar Cup -- Oral - Nectar Straw -- Oral - Thin Teaspoon -- Oral - Thin Cup -- Oral - Thin Straw -- Oral - Puree -- Oral - Mech Soft -- Oral - Regular -- Oral - Multi-Consistency -- Oral - Pill -- Oral Phase - Comment --  CHL IP PHARYNGEAL PHASE 04/29/2018 Pharyngeal Phase Impaired Pharyngeal- Pudding Teaspoon Delayed swallow initiation-vallecula;Pharyngeal residue - valleculae Pharyngeal -- Pharyngeal- Pudding Cup -- Pharyngeal -- Pharyngeal- Honey Teaspoon -- Pharyngeal -- Pharyngeal- Honey Cup -- Pharyngeal -- Pharyngeal- Nectar Teaspoon -- Pharyngeal -- Pharyngeal- Nectar Cup Delayed swallow initiation-vallecula;Pharyngeal residue - valleculae Pharyngeal -- Pharyngeal- Nectar Straw -- Pharyngeal -- Pharyngeal- Thin Teaspoon -- Pharyngeal -- Pharyngeal- Thin Cup Delayed swallow initiation-pyriform sinuses;Reduced airway/laryngeal closure;Reduced anterior laryngeal mobility;Penetration/Aspiration during swallow;Trace aspiration;Pharyngeal residue - valleculae Pharyngeal Material enters airway, passes BELOW cords without attempt by patient to eject out (silent aspiration) Pharyngeal- Thin Straw -- Pharyngeal -- Pharyngeal- Puree -- Pharyngeal -- Pharyngeal- Mechanical Soft Delayed swallow initiation-vallecula;Reduced laryngeal elevation;Pharyngeal residue - valleculae Pharyngeal -- Pharyngeal- Regular -- Pharyngeal -- Pharyngeal- Multi-consistency -- Pharyngeal -- Pharyngeal- Pill -- Pharyngeal -- Pharyngeal Comment --  CHL IP CERVICAL ESOPHAGEAL PHASE 04/29/2018 Cervical Esophageal Phase WFL Pudding Teaspoon -- Pudding Cup -- Honey Teaspoon -- Honey Cup -- Nectar Teaspoon -- Nectar Cup -- Nectar Straw -- Thin Teaspoon -- Thin Cup -- Thin Straw -- Puree -- Mechanical Soft -- Regular -- Multi-consistency -- Pill -- Cervical Esophageal Comment -- Charlynne Cousins Ward 04/29/2018, 1:00 PM               Assessment/Plan  Aspiration pneumonia of Bilateral  Lungs With Acute hypoxemic respiratory failure Continues to be high risk.  He is not a candidate for any kind of aggressive work-up.  Discussed Discussed with the speech therapy we are going to try to follow a strict nectar thick diet.  He finished a course of Augmentin.Last day is 03/05 Continue on Oxygen 2 l   Chronic combined systolic and diastolic CHF On Lasix Follow weights Hypernatremia Stat Labs today to follow sodium  Upper GI bleed Not candidate for EGD On Iron and Protonix Follow CBC Closely Dysphagia,  Evaluated by Speech On honey thick but continue to be very high risk for aspiration  Type 2 diabetes mellitus BS in 150-200 Continue Metformin Alzheimer's dementia  Patient has significant dementia.  Per nurses there has been further decline in past few months.  He is not a candidate for any aggressive therapy. Advanced Care Plan D/W the Daughter and Wife This is my first meeting with them I have explained them that he stays very high risk for aspiration, Hypernatremia and Anemia At this time they say that they want to continue everything unless he stops responding to  them. They will decide how he does day to day and make decision accordingly Addendum Labs came stable Will continue to monitor Family/ staff Communication:  Labs/tests ordered: Stat Bmp and CBC Total time spent in this patient care encounter was 45_ minutes; greater than 50% of the visit spent counseling patient, reviewing records , Labs and coordinating care for problems addressed at this encounter.

## 2018-06-13 DIAGNOSIS — I5042 Chronic combined systolic (congestive) and diastolic (congestive) heart failure: Secondary | ICD-10-CM | POA: Diagnosis not present

## 2018-06-13 DIAGNOSIS — J9611 Chronic respiratory failure with hypoxia: Secondary | ICD-10-CM | POA: Diagnosis not present

## 2018-06-13 DIAGNOSIS — N189 Chronic kidney disease, unspecified: Secondary | ICD-10-CM | POA: Diagnosis not present

## 2018-06-13 DIAGNOSIS — R1312 Dysphagia, oropharyngeal phase: Secondary | ICD-10-CM | POA: Diagnosis not present

## 2018-06-13 DIAGNOSIS — Z9981 Dependence on supplemental oxygen: Secondary | ICD-10-CM | POA: Diagnosis not present

## 2018-06-13 DIAGNOSIS — I130 Hypertensive heart and chronic kidney disease with heart failure and stage 1 through stage 4 chronic kidney disease, or unspecified chronic kidney disease: Secondary | ICD-10-CM | POA: Diagnosis not present

## 2018-06-13 DIAGNOSIS — E1122 Type 2 diabetes mellitus with diabetic chronic kidney disease: Secondary | ICD-10-CM | POA: Diagnosis not present

## 2018-06-16 DIAGNOSIS — R1312 Dysphagia, oropharyngeal phase: Secondary | ICD-10-CM | POA: Diagnosis not present

## 2018-06-16 DIAGNOSIS — E1122 Type 2 diabetes mellitus with diabetic chronic kidney disease: Secondary | ICD-10-CM | POA: Diagnosis not present

## 2018-06-16 DIAGNOSIS — Z9981 Dependence on supplemental oxygen: Secondary | ICD-10-CM | POA: Diagnosis not present

## 2018-06-16 DIAGNOSIS — I130 Hypertensive heart and chronic kidney disease with heart failure and stage 1 through stage 4 chronic kidney disease, or unspecified chronic kidney disease: Secondary | ICD-10-CM | POA: Diagnosis not present

## 2018-06-16 DIAGNOSIS — N189 Chronic kidney disease, unspecified: Secondary | ICD-10-CM | POA: Diagnosis not present

## 2018-06-16 DIAGNOSIS — I5042 Chronic combined systolic (congestive) and diastolic (congestive) heart failure: Secondary | ICD-10-CM | POA: Diagnosis not present

## 2018-06-16 DIAGNOSIS — J9611 Chronic respiratory failure with hypoxia: Secondary | ICD-10-CM | POA: Diagnosis not present

## 2018-06-17 DIAGNOSIS — I5042 Chronic combined systolic (congestive) and diastolic (congestive) heart failure: Secondary | ICD-10-CM | POA: Diagnosis not present

## 2018-06-17 DIAGNOSIS — E1122 Type 2 diabetes mellitus with diabetic chronic kidney disease: Secondary | ICD-10-CM | POA: Diagnosis not present

## 2018-06-17 DIAGNOSIS — I130 Hypertensive heart and chronic kidney disease with heart failure and stage 1 through stage 4 chronic kidney disease, or unspecified chronic kidney disease: Secondary | ICD-10-CM | POA: Diagnosis not present

## 2018-06-17 DIAGNOSIS — R1312 Dysphagia, oropharyngeal phase: Secondary | ICD-10-CM | POA: Diagnosis not present

## 2018-06-17 DIAGNOSIS — J9611 Chronic respiratory failure with hypoxia: Secondary | ICD-10-CM | POA: Diagnosis not present

## 2018-06-17 DIAGNOSIS — N189 Chronic kidney disease, unspecified: Secondary | ICD-10-CM | POA: Diagnosis not present

## 2018-06-17 DIAGNOSIS — Z9981 Dependence on supplemental oxygen: Secondary | ICD-10-CM | POA: Diagnosis not present

## 2018-06-19 ENCOUNTER — Encounter: Payer: Self-pay | Admitting: Family

## 2018-06-19 ENCOUNTER — Non-Acute Institutional Stay (SKILLED_NURSING_FACILITY): Payer: Medicare Other | Admitting: Family

## 2018-06-19 DIAGNOSIS — G309 Alzheimer's disease, unspecified: Secondary | ICD-10-CM

## 2018-06-19 DIAGNOSIS — N183 Chronic kidney disease, stage 3 unspecified: Secondary | ICD-10-CM

## 2018-06-19 DIAGNOSIS — E785 Hyperlipidemia, unspecified: Secondary | ICD-10-CM

## 2018-06-19 DIAGNOSIS — R1310 Dysphagia, unspecified: Secondary | ICD-10-CM | POA: Diagnosis not present

## 2018-06-19 DIAGNOSIS — Z9981 Dependence on supplemental oxygen: Secondary | ICD-10-CM | POA: Diagnosis not present

## 2018-06-19 DIAGNOSIS — N189 Chronic kidney disease, unspecified: Secondary | ICD-10-CM | POA: Diagnosis not present

## 2018-06-19 DIAGNOSIS — E1169 Type 2 diabetes mellitus with other specified complication: Secondary | ICD-10-CM

## 2018-06-19 DIAGNOSIS — J690 Pneumonitis due to inhalation of food and vomit: Secondary | ICD-10-CM

## 2018-06-19 DIAGNOSIS — F418 Other specified anxiety disorders: Secondary | ICD-10-CM

## 2018-06-19 DIAGNOSIS — I5042 Chronic combined systolic (congestive) and diastolic (congestive) heart failure: Secondary | ICD-10-CM | POA: Diagnosis not present

## 2018-06-19 DIAGNOSIS — E1122 Type 2 diabetes mellitus with diabetic chronic kidney disease: Secondary | ICD-10-CM

## 2018-06-19 DIAGNOSIS — F0281 Dementia in other diseases classified elsewhere with behavioral disturbance: Secondary | ICD-10-CM

## 2018-06-19 DIAGNOSIS — R1312 Dysphagia, oropharyngeal phase: Secondary | ICD-10-CM | POA: Diagnosis not present

## 2018-06-19 DIAGNOSIS — R634 Abnormal weight loss: Secondary | ICD-10-CM | POA: Diagnosis not present

## 2018-06-19 DIAGNOSIS — J9611 Chronic respiratory failure with hypoxia: Secondary | ICD-10-CM | POA: Diagnosis not present

## 2018-06-19 DIAGNOSIS — I130 Hypertensive heart and chronic kidney disease with heart failure and stage 1 through stage 4 chronic kidney disease, or unspecified chronic kidney disease: Secondary | ICD-10-CM | POA: Diagnosis not present

## 2018-06-19 NOTE — Progress Notes (Signed)
Location:  [facility] Room Number: 36 Place of Service:  SNF [phone]) Provider: Dvid Pendry FNP-C   [name], MD  Patient Care Team: [name], MD as PCP - General (Internal Medicine) [name], MD as PCP - Cardiology (Cardiology) Mast, Man X, NP as Nurse Practitioner (Internal Medicine)  Extended Emergency Contact Information Primary Emergency Contact: [name] Address: [place], [place] Phone: [phone] Mobile Phone: [phone] Relation: Spouse Secondary Emergency Contact: [name] [place] Phone: [phone] Work Phone: [phone] Relation: Daughter  Code Status:  DNR Goals of care: Advanced Directive information Advanced Directives 06/19/2018  Does Patient Have a Medical Advance Directive? Yes  Type of Paramedic of [place];Living will  Does patient want to make changes to medical advance directive? No - Patient declined  Copy of [place] in Chart? Yes - validated most recent copy scanned in chart (See row information)  Would patient like information on creating a medical advance directive? -  Pre-existing out of facility DNR order (yellow form or pink MOST form) -     Chief Complaint  Patient presents with   Medical Management of Chronic Issues    Routine Visit   Quality Metric Gaps    Microalbumin and Eye exam    HPI:  Pt is a 85 y.o. male seen today [place] for medical management of chronic diseases.He is seen in his room today with sitter present at bedside.He has a medical history of Chronic combined systolic and diastolic CHF,Type 2 DM,Hyperlipidemia,CKD stage 3,Depression with anxiety,Alzheimer's dementia with behavioral disturbance among other conditions.He denies any any acute issues during visit though HPI limited due to his cognitive impairment sometimes answers question and  stares at provider at times.Sitter and Nurse reports no new concerns.Nurse states patient daughter recently used a new razor and shaving cream causing some redness on both jaw areas but resolved when new shaving cream stopped  and patient's regular shaving cream used without any reactions.  Of note he is status post hospital admission 04/25/18  and readmission 05/01/18 and 05/11/2018 for pneumonia.He has completed antibiotics but continues to require oxygen 2 liters via nasal cannula.He continues to have occasional cough.He is on Aspiration precaution Nectar think diet.continues to follow up with speech therapy.He requires assistance with his ADL's.  No recent fall episode or skin breakdown. CBG log reviewed morning readings in the 90's-130's and 120's - 190's with occasional 200's in the afternoon.  Weight log reviewed has had a 4.4 lbs weight loss over one month though some weight irregularity noted : wt 169 lbs (06/11/18); wt 175.8 lbs (06/13/18) and   wt 175 lbs (06/16/18).Facility Nurse supervisor notified to reweigh and monitor weight.sitter/Nurse reports patient good appetite.      Past Medical History:  Diagnosis Date   Abnormality of gait    Backache, unspecified    CHF (congestive heart failure) ([place])    Colon polyps    adenomatous   Dementia ([place]) 11/12/2012   Depression    Diabetes mellitus without complication ([place])    Diverticulosis    Essential and other specified forms of tremor    Hemorrhoids    History of bilateral hip replacements 11/12/2012   Memory loss    Other persistent mental disorders due to conditions classified elsewhere    Pain in joint, pelvic region and thigh  Skin cancer of scalp    Spinal stenosis, lumbar region, without neurogenic claudication    Unspecified hereditary and idiopathic peripheral neuropathy    Past Surgical History:  Procedure Laterality Date   CATARACT EXTRACTION, BILATERAL  2012   JOINT REPLACEMENT     RETINAL LASER  PROCEDURE  2012   retinal wrinkle   SKIN CANCER EXCISION     TOTAL HIP ARTHROPLASTY Left 2000   TOTAL HIP ARTHROPLASTY (aka REPLACEMENT) Right 2011    Allergies  Allergen Reactions   Axona [Bacid] Other (See Comments)    Unknown per MAR   Gabapentin Other (See Comments)    Hallucinations    Allergies as of 06/19/2018      Reactions   Axona [bacid] Other (See Comments)   Unknown per MAR   Gabapentin Other (See Comments)   Hallucinations      Medication List       Accurate as of June 19, 2018  1:54 PM. Always use your most recent med list.        albuterol (2.5 MG/3ML) 0.083% nebulizer solution Commonly known as:  PROVENTIL Take 3 mLs (2.5 mg total) by nebulization every 4 (four) hours as needed for wheezing or shortness of breath.   Align 4 MG Caps Take 4 mg by mouth daily.   buPROPion 100 MG 12 hr tablet Commonly known as:  WELLBUTRIN SR Take 100 mg by mouth daily.   carvedilol 3.125 MG tablet Commonly known as:  COREG Take 1 tablet (3.125 mg total) by mouth 2 (two) times daily with a meal.   docusate sodium 100 MG capsule Commonly known as:  COLACE Take 100 mg by mouth daily.   donepezil 23 MG Tabs tablet Commonly known as:  Aricept Take 1 tablet (23 mg total) by mouth daily.   dorzolamide-timolol 22.3-6.8 MG/ML ophthalmic solution Commonly known as:  COSOPT Place 1 drop into the left eye 2 (two) times daily.   ferrous sulfate 325 (65 FE) MG tablet Take 325 mg by mouth daily with breakfast.   Flaxseed Oil 1000 MG Caps Take 1,000 mg by mouth daily.   folic acid 220 MCG tablet Commonly known as:  FOLVITE Take 800 mcg by mouth every evening.   furosemide 20 MG tablet Commonly known as:  LASIX Take 1 tablet (20 mg total) by mouth daily.   memantine 28 MG Cp24 24 hr capsule Commonly known as:  Namenda XR Take 1 capsule (28 mg total) by mouth daily.   metFORMIN 500 MG tablet Commonly known as:  GLUCOPHAGE Take 500 mg by mouth 2 (two) times  daily with a meal.   multivitamin tablet Take 1 tablet by mouth daily.   pantoprazole 40 MG tablet Commonly known as:  PROTONIX Take 1 tablet (40 mg total) by mouth daily.   simvastatin 5 MG tablet Commonly known as:  ZOCOR Take 5 mg by mouth daily.   Vitamin D 1000 units capsule Take 1,000 Units by mouth daily.   zinc oxide 20 % ointment Apply 1 application topically as needed for irritation. To buttocks after every incontinent episode and as needed for redness       Review of Systems  Unable to perform ROS: Dementia (additional information provided by facility Nurse and sitter )    Immunization History  Administered Date(s) Administered   Influenza-Unspecified 01/10/2017, 01/13/2018   PPD Test 06/24/2016   Pneumococcal-Unspecified 06/26/2012   Td 12/02/2011   Zoster Recombinat (Shingrix) 06/10/2017, 08/12/2017   Pertinent  Health Maintenance Due  Topic Date Due   OPHTHALMOLOGY EXAM  06/06/1940   URINE MICROALBUMIN  06/06/1940   HEMOGLOBIN A1C  06/03/2018   FOOT EXAM  08/07/2018   INFLUENZA VACCINE  Completed   PNA vac Low Risk Adult  Completed   Fall Risk  12/13/2017 10/14/2017 12/07/2016 01/19/2016 07/21/2015  Falls in the past year? No No No Yes Yes  Number falls in past yr: - - - 1 1  Injury with Fall? - - - No No  Risk for fall due to : - Impaired balance/gait - Impaired balance/gait -  Follow up - - - Falls prevention discussed Falls prevention discussed    Vitals:   06/19/18 1136  BP: 115/74  Pulse: 95  Resp: 20  Temp: (!) 97.5 F (36.4 C)  TempSrc: Oral  SpO2: 99%  Weight: 170 lb 9.6 oz (77.4 kg)  Height: 6\' 1"  (1.854 m)   Body mass index is 22.51 kg/m. Physical Exam Vitals signs and nursing note reviewed.  Constitutional:      General: He is not in acute distress.    Appearance: He is normal weight. He is not ill-appearing.  HENT:     Head: Normocephalic.     Nose: Nose normal. No congestion or rhinorrhea.     Mouth/Throat:      Mouth: Mucous membranes are moist.     Pharynx: Oropharynx is clear. No oropharyngeal exudate or posterior oropharyngeal erythema.  Eyes:     General: No scleral icterus.       Right eye: No discharge.        Left eye: No discharge.     Conjunctiva/sclera: Conjunctivae normal.     Pupils: Pupils are equal, round, and reactive to light.  Neck:     Musculoskeletal: Normal range of motion. No neck rigidity or muscular tenderness.     Vascular: No carotid bruit.  Cardiovascular:     Rate and Rhythm: Normal rate and regular rhythm.     Pulses: Normal pulses.     Heart sounds: Normal heart sounds. No murmur. No friction rub. No gallop.   Pulmonary:     Effort: Pulmonary effort is normal. No respiratory distress.     Breath sounds: No wheezing, rhonchi or rales.     Comments: Bilateral breath sounds diminished clear though unable to follow instructions to take deep breath.oxygen 2 liters via nasal cannula in place.  Chest:     Chest wall: No tenderness.  Abdominal:     General: Bowel sounds are normal. There is no distension.     Palpations: Abdomen is soft. There is no mass.     Tenderness: There is no abdominal tenderness. There is no right CVA tenderness, left CVA tenderness, guarding or rebound.  Genitourinary:    Comments: Incontinent  Musculoskeletal:        General: No tenderness.     Right lower leg: No edema.     Left lower leg: No edema.     Comments: Moves x 4 extremities without any difficulties.unsteady gait requires total assistance with transfers from bed to chair.   Lymphadenopathy:     Cervical: No cervical adenopathy.  Skin:    General: Skin is warm and dry.     Coloration: Skin is not pale.     Findings: No erythema or rash.     Comments: Skin intact   Neurological:     Mental Status: Mental status is at baseline.     Cranial Nerves: No cranial nerve deficit.  Sensory: No sensory deficit.     Motor: No weakness.     Coordination: Coordination normal.      Gait: Gait abnormal.  Psychiatric:        Mood and Affect: Mood normal.        Speech: Speech normal.        Behavior: Behavior normal.        Thought Content: Thought content normal.        Cognition and Memory: Memory is impaired.        Judgment: Judgment normal.    Labs reviewed: Recent Labs    04/26/18 0316  05/13/18 0251  05/19/18 0306  05/21/18 0338 05/22/18 1129 05/23/18 0218 05/28/18  NA 148*   < > 142   < > 150*   < > 148* 146* 148* 140  K 3.7   < > 4.1   < > 3.7   < > 3.5 3.5 3.1* 3.5  CL 115*   < > 115*   < > 117*   < > 117* 113* 110  --   CO2 26   < > 21*   < > 28   < > 26 25 29   --   GLUCOSE 168*   < > 104*   < > 152*   < > 157* 217* 157*  --   BUN 34*   < > 18   < > 20   < > 19 20 19 12   CREATININE 1.32*   < > 1.00   < > 0.97   < > 0.92 1.01 1.01 0.8  CALCIUM 8.2*   < > 7.8*   < > 8.0*   < > 8.0* 8.1* 8.4*  --   MG 2.2  --  2.1  --  2.1  --   --   --   --   --   PHOS 3.3  --  3.1  --   --   --   --   --   --   --    < > = values in this interval not displayed.   Recent Labs    04/27/18 0321 05/01/18 0034 05/11/18 1626 05/13/18 0251 05/28/18  AST 44* 47* 37  --  12*  ALT 40 60* 27  --  13  ALKPHOS 91 111 86  --  96  BILITOT 0.5 0.6 0.5  --   --   PROT 5.5* 6.2* 5.4*  --   --   ALBUMIN 2.5* 2.6* 2.5* 2.0*  --    Recent Labs    05/11/18 1626 05/13/18 0251 05/13/18 0505  05/20/18 0255 05/21/18 0338 05/22/18 1129 05/28/18  WBC 15.6* 7.8 8.2   < > 8.8 7.4 9.4 8.2  NEUTROABS 13.2* 5.1 5.7  --   --   --   --   --   HGB 7.6* 5.5* 5.5*   < > 8.7* 8.1* 9.0* 10.1*  HCT 26.0* 19.5* 19.1*   < > 30.1* 28.5* 30.8* 32*  MCV 91.2 92.9 92.7   < > 96.2 95.6 95.7  --   PLT 182 130* 126*   < > 155 132* 142* 180   < > = values in this interval not displayed.   Lab Results  Component Value Date   TSH 2.61 08/08/2017   Lab Results  Component Value Date   HGBA1C 7.7 12/03/2017   Lab Results  Component Value Date   CHOL 150 09/05/2017   HDL 49 01/31/2017  LDLCALC 89 09/05/2017   TRIG 113 09/05/2017    Significant Diagnostic Results in last 30 days:  Ct Chest W Contrast  Result Date: 05/22/2018 CLINICAL DATA:  SOB per pt chart; Pt is totally non-verbal, denies chest pain EXAM: CT CHEST WITH CONTRAST TECHNIQUE: Multidetector CT imaging of the chest was performed during intravenous contrast administration. CONTRAST:  65mL OMNIPAQUE IOHEXOL 300 MG/ML  SOLN COMPARISON:  11/28/2016 FINDINGS: Cardiovascular: Heart size upper limits normal. The RV is nondilated. No pleural effusion. Coronary and aortic calcifications without aneurysm. Mediastinum/Nodes: No pathologically enlarged mediastinal lymph nodes. Calcified bilateral hilar lymph nodes. Lungs/Pleura: Small pleural effusions right greater than left. No pneumothorax. Patchy airspace opacities in the right upper lobe primarily apical and posterior segments, to a lesser degree in the inferomedial lingula and posterior left upper lobe. Dependent atelectasis posteriorly in both lower lobes. Upper Abdomen: No acute findings. Moderate gastric distention. Musculoskeletal: Anterior vertebral endplate spurring at multiple levels in the mid and lower thoracic spine. Spondylitic changes in the lower cervical spine. IMPRESSION: 1. Patchy airspace opacities in the right upper lobe, lingula, and posterior left upper lobe may represent pneumonia or atypical pulmonary edema. 2. Small pleural effusions right greater than left. 3. Coronary and Aortic Atherosclerosis (ICD10-I70.0). Electronically Signed   By: Lucrezia Europe M.D.   On: 05/22/2018 10:37   Dg Chest Port 1 View  Result Date: 05/21/2018 CLINICAL DATA:  85 y/o  M; shortness of breath and crackles. EXAM: PORTABLE CHEST 1 VIEW COMPARISON:  05/17/2018 chest radiograph FINDINGS: Normal cardiac silhouette. Aortic atherosclerosis with calcification. Diffusely increased reticular and patchy airspace opacities greatest in the right lung. No pleural effusion or pneumothorax. No  acute osseous abnormality is evident. IMPRESSION: Diffusely increased reticular and patchy airspace opacities greatest in the right lung probably represents interstitial and alveolar pulmonary edema. Underlying pneumonia is not excluded. Electronically Signed   By: Kristine Garbe M.D.   On: 05/21/2018 22:39    Assessment/Plan 1. Abnormal weight loss Has had a 4.4 lbs weight loss over one month with some weight irregularity.Facility Nurse supervisor notified will recheck weight.Follow up with Registered Dietician.continue with weight check three times per week.     2. Chronic combined systolic and diastolic congestive heart failure (HCC) No signs of fluid overload noted.continue on furosemide 20 mg tablet daily and coreg 3.125 mg tablet twice daily.continue to monitor weight three time per week.   3. Type 2 diabetes mellitus with stage 3 chronic kidney disease, without long-term current use of insulin (HCC) CBG log reviewed readings overall in the 90's-190's with few 200's.continue metformin 500 mg tablet twice daily.Upto date with foot exam.On statin for cardiac event prevention.  - Hgb A1C 06/23/2018 - Ophthalmology referral for annual diabetic eye exam.  - Urine specimen for urine microalbuminuria.   4. Dysphagia, unspecified type Continue on Nectar thick.Remains high risk for aspiration.continue with Speech therapy and Aspiration precaution.   5. Hyperlipidemia associated with type 2 diabetes mellitus ([place]) - continue on simvastatin 5 mg tablet daily.  - Lipid panel 06/23/2018   6. Alzheimer's dementia with behavioral disturbance, unspecified timing of dementia onset ([place]) No new behavioral issues reported.continue with supportive care.continue on Aricept 23 mg tablet daily and Namenda 28 mg capsule daily.   7. Depression with anxiety Mood stable.continue Wellbutrin 100 mg tablet daily.monitor for mood changes.  8.Aspiration pneumonia  Remains high risk for aspiration. -  wean off oxygen as tolerated to keep oxygen saturation > 90 %   Family/ staff Communication: Reviewed plan  of care with patient and facility Nurse supervisor   Labs/tests ordered:  - Hgb A1C and Lipid panel 06/23/2018  - Urine specimen for urine microalbuminuria.   Sandrea Hughs, NP

## 2018-06-20 DIAGNOSIS — N189 Chronic kidney disease, unspecified: Secondary | ICD-10-CM | POA: Diagnosis not present

## 2018-06-20 DIAGNOSIS — J9611 Chronic respiratory failure with hypoxia: Secondary | ICD-10-CM | POA: Diagnosis not present

## 2018-06-20 DIAGNOSIS — Z9981 Dependence on supplemental oxygen: Secondary | ICD-10-CM | POA: Diagnosis not present

## 2018-06-20 DIAGNOSIS — I130 Hypertensive heart and chronic kidney disease with heart failure and stage 1 through stage 4 chronic kidney disease, or unspecified chronic kidney disease: Secondary | ICD-10-CM | POA: Diagnosis not present

## 2018-06-20 DIAGNOSIS — E1122 Type 2 diabetes mellitus with diabetic chronic kidney disease: Secondary | ICD-10-CM | POA: Diagnosis not present

## 2018-06-20 DIAGNOSIS — R1312 Dysphagia, oropharyngeal phase: Secondary | ICD-10-CM | POA: Diagnosis not present

## 2018-06-20 DIAGNOSIS — I5042 Chronic combined systolic (congestive) and diastolic (congestive) heart failure: Secondary | ICD-10-CM | POA: Diagnosis not present

## 2018-06-23 DIAGNOSIS — E1122 Type 2 diabetes mellitus with diabetic chronic kidney disease: Secondary | ICD-10-CM | POA: Diagnosis not present

## 2018-06-23 DIAGNOSIS — R1312 Dysphagia, oropharyngeal phase: Secondary | ICD-10-CM | POA: Diagnosis not present

## 2018-06-23 DIAGNOSIS — D62 Acute posthemorrhagic anemia: Secondary | ICD-10-CM | POA: Diagnosis not present

## 2018-06-23 DIAGNOSIS — J9611 Chronic respiratory failure with hypoxia: Secondary | ICD-10-CM | POA: Diagnosis not present

## 2018-06-23 DIAGNOSIS — N189 Chronic kidney disease, unspecified: Secondary | ICD-10-CM | POA: Diagnosis not present

## 2018-06-23 DIAGNOSIS — I130 Hypertensive heart and chronic kidney disease with heart failure and stage 1 through stage 4 chronic kidney disease, or unspecified chronic kidney disease: Secondary | ICD-10-CM | POA: Diagnosis not present

## 2018-06-23 DIAGNOSIS — I5042 Chronic combined systolic (congestive) and diastolic (congestive) heart failure: Secondary | ICD-10-CM | POA: Diagnosis not present

## 2018-06-23 DIAGNOSIS — J9600 Acute respiratory failure, unspecified whether with hypoxia or hypercapnia: Secondary | ICD-10-CM | POA: Diagnosis not present

## 2018-06-23 DIAGNOSIS — Z9981 Dependence on supplemental oxygen: Secondary | ICD-10-CM | POA: Diagnosis not present

## 2018-06-23 LAB — LIPID PANEL
Cholesterol: 158 (ref 0–200)
HDL: 39 (ref 35–70)
LDL Cholesterol: 99
Triglycerides: 100 (ref 40–160)

## 2018-06-23 LAB — HEMOGLOBIN A1C: Hemoglobin A1C: 6.2

## 2018-06-24 DIAGNOSIS — I130 Hypertensive heart and chronic kidney disease with heart failure and stage 1 through stage 4 chronic kidney disease, or unspecified chronic kidney disease: Secondary | ICD-10-CM | POA: Diagnosis not present

## 2018-06-24 DIAGNOSIS — J9611 Chronic respiratory failure with hypoxia: Secondary | ICD-10-CM | POA: Diagnosis not present

## 2018-06-24 DIAGNOSIS — E1122 Type 2 diabetes mellitus with diabetic chronic kidney disease: Secondary | ICD-10-CM | POA: Diagnosis not present

## 2018-06-24 DIAGNOSIS — I5042 Chronic combined systolic (congestive) and diastolic (congestive) heart failure: Secondary | ICD-10-CM | POA: Diagnosis not present

## 2018-06-24 DIAGNOSIS — Z9981 Dependence on supplemental oxygen: Secondary | ICD-10-CM | POA: Diagnosis not present

## 2018-06-24 DIAGNOSIS — N189 Chronic kidney disease, unspecified: Secondary | ICD-10-CM | POA: Diagnosis not present

## 2018-06-24 DIAGNOSIS — R1312 Dysphagia, oropharyngeal phase: Secondary | ICD-10-CM | POA: Diagnosis not present

## 2018-06-26 DIAGNOSIS — Z9981 Dependence on supplemental oxygen: Secondary | ICD-10-CM | POA: Diagnosis not present

## 2018-06-26 DIAGNOSIS — J9611 Chronic respiratory failure with hypoxia: Secondary | ICD-10-CM | POA: Diagnosis not present

## 2018-06-26 DIAGNOSIS — N189 Chronic kidney disease, unspecified: Secondary | ICD-10-CM | POA: Diagnosis not present

## 2018-06-26 DIAGNOSIS — R1312 Dysphagia, oropharyngeal phase: Secondary | ICD-10-CM | POA: Diagnosis not present

## 2018-06-26 DIAGNOSIS — I130 Hypertensive heart and chronic kidney disease with heart failure and stage 1 through stage 4 chronic kidney disease, or unspecified chronic kidney disease: Secondary | ICD-10-CM | POA: Diagnosis not present

## 2018-06-26 DIAGNOSIS — I5042 Chronic combined systolic (congestive) and diastolic (congestive) heart failure: Secondary | ICD-10-CM | POA: Diagnosis not present

## 2018-06-26 DIAGNOSIS — E1122 Type 2 diabetes mellitus with diabetic chronic kidney disease: Secondary | ICD-10-CM | POA: Diagnosis not present

## 2018-06-30 DIAGNOSIS — Z9981 Dependence on supplemental oxygen: Secondary | ICD-10-CM | POA: Diagnosis not present

## 2018-06-30 DIAGNOSIS — J9611 Chronic respiratory failure with hypoxia: Secondary | ICD-10-CM | POA: Diagnosis not present

## 2018-06-30 DIAGNOSIS — N189 Chronic kidney disease, unspecified: Secondary | ICD-10-CM | POA: Diagnosis not present

## 2018-06-30 DIAGNOSIS — I5042 Chronic combined systolic (congestive) and diastolic (congestive) heart failure: Secondary | ICD-10-CM | POA: Diagnosis not present

## 2018-06-30 DIAGNOSIS — R1312 Dysphagia, oropharyngeal phase: Secondary | ICD-10-CM | POA: Diagnosis not present

## 2018-06-30 DIAGNOSIS — E1122 Type 2 diabetes mellitus with diabetic chronic kidney disease: Secondary | ICD-10-CM | POA: Diagnosis not present

## 2018-06-30 DIAGNOSIS — I130 Hypertensive heart and chronic kidney disease with heart failure and stage 1 through stage 4 chronic kidney disease, or unspecified chronic kidney disease: Secondary | ICD-10-CM | POA: Diagnosis not present

## 2018-07-02 ENCOUNTER — Non-Acute Institutional Stay (SKILLED_NURSING_FACILITY): Payer: Medicare Other | Admitting: Family

## 2018-07-02 ENCOUNTER — Encounter: Payer: Self-pay | Admitting: Family

## 2018-07-02 DIAGNOSIS — R21 Rash and other nonspecific skin eruption: Secondary | ICD-10-CM

## 2018-07-02 DIAGNOSIS — S31829A Unspecified open wound of left buttock, initial encounter: Secondary | ICD-10-CM

## 2018-07-02 DIAGNOSIS — Z9981 Dependence on supplemental oxygen: Secondary | ICD-10-CM | POA: Diagnosis not present

## 2018-07-02 DIAGNOSIS — N189 Chronic kidney disease, unspecified: Secondary | ICD-10-CM | POA: Diagnosis not present

## 2018-07-02 DIAGNOSIS — R1312 Dysphagia, oropharyngeal phase: Secondary | ICD-10-CM | POA: Diagnosis not present

## 2018-07-02 DIAGNOSIS — D62 Acute posthemorrhagic anemia: Secondary | ICD-10-CM | POA: Diagnosis not present

## 2018-07-02 DIAGNOSIS — I130 Hypertensive heart and chronic kidney disease with heart failure and stage 1 through stage 4 chronic kidney disease, or unspecified chronic kidney disease: Secondary | ICD-10-CM | POA: Diagnosis not present

## 2018-07-02 DIAGNOSIS — E1122 Type 2 diabetes mellitus with diabetic chronic kidney disease: Secondary | ICD-10-CM | POA: Diagnosis not present

## 2018-07-02 DIAGNOSIS — I5042 Chronic combined systolic (congestive) and diastolic (congestive) heart failure: Secondary | ICD-10-CM | POA: Diagnosis not present

## 2018-07-02 DIAGNOSIS — J9611 Chronic respiratory failure with hypoxia: Secondary | ICD-10-CM | POA: Diagnosis not present

## 2018-07-02 NOTE — Progress Notes (Signed)
Location:  [place] Room Number: 36 Place of Service:  SNF (31) Provider: Carren Blakley.NP   [name], MD  Patient Care Team: [name], MD as PCP - General (Internal Medicine) [name], MD as PCP - Cardiology (Cardiology) Mast, Man X, NP as Nurse Practitioner (Internal Medicine) Myeesha Shane, [name], NP as Nurse Practitioner (Family Medicine)  Extended Emergency Contact Information Primary Emergency Contact: [name] Address: [address], [place] Phone: [phone] Mobile Phone: [phone] Relation: Spouse Secondary Emergency Contact: [name] [place] of [place] Phone: [phone] Work Phone: [phone] Relation: Daughter  Code Status: DNR Goals of care: Advanced Directive information Advanced Directives 07/02/2018  Does Patient Have a Medical Advance Directive? No  Type of Advance Directive -  Does patient want to make changes to medical advance directive? -  Copy of [place] in Chart? -  Would patient like information on creating a medical advance directive? No - Patient declined  Pre-existing out of facility DNR order (yellow form or pink MOST form) -     Chief Complaint  Patient presents with  . Acute Visit    Gluteal open area     HPI:  Pt is a 85 y.o. male seen today for an acute visit for evaluation of left gluteal wound.He is seen in his room lying in bed with facility Nurse and sitter present at bedside.Nurse reports patient has an open area on left gluteal fold x 1 day.Nurse also states patient previous skin redness on the neck and beard area seems to be worsening.Previous thought caused by new shaving cream bought by family.Patient states rash has been itchy. No fever,chills,bleeding,drainage noted.Patient denies any pain.      Past Medical History:  Diagnosis Date  . Abnormality of gait   . Backache, unspecified   . CHF  (congestive heart failure) ([place])   . Colon polyps    adenomatous  . Dementia ([place]) 11/12/2012  . Depression   . Diabetes mellitus without complication ([place])   . Diverticulosis   . Essential and other specified forms of tremor   . Hemorrhoids   . History of bilateral hip replacements 11/12/2012  . Memory loss   . 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 Pain in joint, pelvic region and thigh   . Skin cancer of scalp   . Spinal stenosis, lumbar region, without neurogenic claudication   . Unspecified hereditary and idiopathic peripheral neuropathy    Past Surgical History:  Procedure Laterality Date  . CATARACT EXTRACTION, BILATERAL  2012  . JOINT REPLACEMENT    . RETINAL LASER PROCEDURE  2012   retinal wrinkle  . SKIN CANCER EXCISION    . TOTAL HIP ARTHROPLASTY Left 2000  . TOTAL HIP ARTHROPLASTY (aka REPLACEMENT) Right 2011    Allergies  Allergen Reactions  . Axona [Bacid] Other (See Comments)    Unknown per MAR  . Gabapentin Other (See Comments)    Hallucinations    Outpatient Encounter Medications as of 07/02/2018  Medication Sig  . albuterol (PROVENTIL) (2.5 MG/3ML) 0.083% nebulizer solution Take 3 mLs (2.5 mg total) by nebulization every 4 (four) hours as needed for wheezing or shortness of breath.  Marland Kitchen buPROPion (WELLBUTRIN SR) 100 MG 12 hr tablet Take 100 mg by mouth daily.  . carvedilol (COREG) 3.125 MG tablet Take 1 tablet (3.125 mg total) by mouth  2 (two) times daily with a meal.  . Cholecalciferol (VITAMIN D) 1000 UNITS capsule Take 1,000 Units by mouth daily.    Marland Kitchen docusate sodium (COLACE) 100 MG capsule Take 100 mg by mouth daily.  Marland Kitchen donepezil (ARICEPT) 23 MG TABS tablet Take 1 tablet (23 mg total) by mouth daily.  . dorzolamide-timolol (COSOPT) 22.3-6.8 MG/ML ophthalmic solution Place 1 drop into the left eye 2 (two) times daily.   . ferrous sulfate 325 (65 FE) MG tablet Take 325 mg by mouth daily with breakfast.  . Flaxseed,  Linseed, (FLAXSEED OIL) 1000 MG CAPS Take 1,000 mg by mouth daily.    . folic acid (FOLVITE) 401 MCG tablet Take 800 mcg by mouth every evening.   . furosemide (LASIX) 20 MG tablet Take 1 tablet (20 mg total) by mouth daily.  . memantine (NAMENDA XR) 28 MG CP24 24 hr capsule Take 1 capsule (28 mg total) by mouth daily.  . metFORMIN (GLUCOPHAGE) 500 MG tablet Take 500 mg by mouth 2 (two) times daily with a meal.  . Multiple Vitamin (MULTIVITAMIN) tablet Take 1 tablet by mouth daily.    . pantoprazole (PROTONIX) 40 MG tablet Take 1 tablet (40 mg total) by mouth daily.  . Probiotic Product (ALIGN) 4 MG CAPS Take 4 mg by mouth daily.  . simvastatin (ZOCOR) 5 MG tablet Take 5 mg by mouth daily.  Marland Kitchen zinc oxide 20 % ointment Apply 1 application topically as needed for irritation. To buttocks after every incontinent episode and as needed for redness   No facility-administered encounter medications on file as of 07/02/2018.     Review of Systems  Unable to perform ROS: Dementia (additional information provided by facility Nurse)  Constitutional: Negative for appetite change, chills and fever.  Respiratory: Negative for cough, chest tightness, shortness of breath and wheezing.   Cardiovascular: Negative for chest pain, palpitations and leg swelling.  Genitourinary:       Incontinent for bowel and bladder   Musculoskeletal: Positive for gait problem.  Skin: Positive for wound. Negative for color change, pallor and rash.       itchy rash on neck and beard areas. Left gluteal fold wound   Psychiatric/Behavioral: Negative for agitation, confusion and sleep disturbance. The patient is not nervous/anxious.     Immunization History  Administered Date(s) Administered  . Influenza-Unspecified 01/10/2017, 01/13/2018  . PPD Test 06/24/2016  . Pneumococcal-Unspecified 06/26/2012  . Td 12/02/2011  . Zoster Recombinat (Shingrix) 06/10/2017, 08/12/2017   Pertinent  Health Maintenance Due  Topic Date Due  .  OPHTHALMOLOGY EXAM  06/06/1940  . URINE MICROALBUMIN  06/06/1940  . HEMOGLOBIN A1C  06/03/2018  . FOOT EXAM  08/07/2018  . INFLUENZA VACCINE  11/01/2018  . PNA vac Low Risk Adult  Completed   Fall Risk  12/13/2017 10/14/2017 12/07/2016 01/19/2016 07/21/2015  Falls in the past year? No No No Yes Yes  Number falls in past yr: - - - 1 1  Injury with Fall? - - - No No  Risk for fall due to : - Impaired balance/gait - Impaired balance/gait -  Follow up - - - Falls prevention discussed Falls prevention discussed    Vitals:   07/02/18 1619  BP: 128/89  Pulse: 94  Resp: 20  Temp: (!) 97.4 F (36.3 C)  SpO2: 97%  Weight: 171 lb 3.2 oz (77.7 kg)  Height: 6\' 1"  (1.854 m)   Body mass index is 22.59 kg/m. Physical Exam Constitutional:      General:  He is not in acute distress.    Appearance: He is normal weight. He is not ill-appearing.  Eyes:     General: No scleral icterus.       Right eye: No discharge.        Left eye: No discharge.     Conjunctiva/sclera: Conjunctivae normal.     Pupils: Pupils are equal, round, and reactive to light.  Cardiovascular:     Rate and Rhythm: Normal rate and regular rhythm.     Pulses: Normal pulses.     Heart sounds: Normal heart sounds. No murmur. No friction rub. No gallop.   Pulmonary:     Effort: Pulmonary effort is normal. No respiratory distress.     Breath sounds: Normal breath sounds. No wheezing, rhonchi or rales.  Chest:     Chest wall: No tenderness.  Abdominal:     General: Bowel sounds are normal. There is no distension.     Palpations: Abdomen is soft. There is no mass.     Tenderness: There is no abdominal tenderness. There is no right CVA tenderness, left CVA tenderness, guarding or rebound.  Musculoskeletal:        General: No tenderness.     Right lower leg: No edema.     Left lower leg: No edema.     Comments: Moves x 4 extremities.Requires assistance with transfers  Skin:    General: Skin is warm and dry.     Coloration:  Skin is not pale.     Findings: No erythema.     Comments: Left gluteal fold wound.Wound bed red without any drainage.surrounding skin tissue without any signs of infections.    Neurological:     Mental Status: He is alert. Mental status is at baseline.     Sensory: No sensory deficit.     Motor: No weakness.     Coordination: Coordination normal.     Gait: Gait abnormal.  Psychiatric:        Mood and Affect: Mood normal.        Behavior: Behavior normal.        Thought Content: Thought content normal.        Judgment: Judgment normal.    Labs reviewed: Recent Labs    04/26/18 0316  05/13/18 0251  05/19/18 0306  05/21/18 0338 05/22/18 1129 05/23/18 0218 05/28/18  NA 148*   < > 142   < > 150*   < > 148* 146* 148* 140  K 3.7   < > 4.1   < > 3.7   < > 3.5 3.5 3.1* 3.5  CL 115*   < > 115*   < > 117*   < > 117* 113* 110  --   CO2 26   < > 21*   < > 28   < > 26 25 29   --   GLUCOSE 168*   < > 104*   < > 152*   < > 157* 217* 157*  --   BUN 34*   < > 18   < > 20   < > 19 20 19 12   CREATININE 1.32*   < > 1.00   < > 0.97   < > 0.92 1.01 1.01 0.8  CALCIUM 8.2*   < > 7.8*   < > 8.0*   < > 8.0* 8.1* 8.4*  --   MG 2.2  --  2.1  --  2.1  --   --   --   --   --  PHOS 3.3  --  3.1  --   --   --   --   --   --   --    < > = values in this interval not displayed.   Recent Labs    04/27/18 0321 05/01/18 0034 05/11/18 1626 05/13/18 0251 05/28/18  AST 44* 47* 37  --  12*  ALT 40 60* 27  --  13  ALKPHOS 91 111 86  --  96  BILITOT 0.5 0.6 0.5  --   --   PROT 5.5* 6.2* 5.4*  --   --   ALBUMIN 2.5* 2.6* 2.5* 2.0*  --    Recent Labs    05/11/18 1626 05/13/18 0251 05/13/18 0505  05/20/18 0255 05/21/18 0338 05/22/18 1129 05/28/18  WBC 15.6* 7.8 8.2   < > 8.8 7.4 9.4 8.2  NEUTROABS 13.2* 5.1 5.7  --   --   --   --   --   HGB 7.6* 5.5* 5.5*   < > 8.7* 8.1* 9.0* 10.1*  HCT 26.0* 19.5* 19.1*   < > 30.1* 28.5* 30.8* 32*  MCV 91.2 92.9 92.7   < > 96.2 95.6 95.7  --   PLT 182 130* 126*   <  > 155 132* 142* 180   < > = values in this interval not displayed.   Lab Results  Component Value Date   TSH 2.61 08/08/2017   Lab Results  Component Value Date   HGBA1C 7.7 12/03/2017   Lab Results  Component Value Date   CHOL 150 09/05/2017   HDL 49 01/31/2017   LDLCALC 89 09/05/2017   TRIG 113 09/05/2017    Significant Diagnostic Results in last 30 days:  No results found.  Assessment/Plan  1. Wound of left buttock, initial encounter Afebrile.Wound bed red without any drainage.surrounding skin tissue without any signs of infections.Cleanse wound with saline,pat dry and cover with foam dressing for extra cushioning and protection.change dressing every 3 days and as needed if soiled.continue to monitor for signs of infection.   2. Rash of neck Afebrile.unknown etiology.widespread itchy skin redness on both sides of neck and beard area.no vesciles noted.Loratadine 10 mg tablet one by mouth daily x 14 days.continue to monitor.   Family/ staff Communication: patient and facility Nurse.   Labs/tests ordered: None

## 2018-07-04 DIAGNOSIS — D62 Acute posthemorrhagic anemia: Secondary | ICD-10-CM | POA: Diagnosis not present

## 2018-07-04 DIAGNOSIS — E1122 Type 2 diabetes mellitus with diabetic chronic kidney disease: Secondary | ICD-10-CM | POA: Diagnosis not present

## 2018-07-04 DIAGNOSIS — I5042 Chronic combined systolic (congestive) and diastolic (congestive) heart failure: Secondary | ICD-10-CM | POA: Diagnosis not present

## 2018-07-04 DIAGNOSIS — Z9981 Dependence on supplemental oxygen: Secondary | ICD-10-CM | POA: Diagnosis not present

## 2018-07-04 DIAGNOSIS — R1312 Dysphagia, oropharyngeal phase: Secondary | ICD-10-CM | POA: Diagnosis not present

## 2018-07-04 DIAGNOSIS — N189 Chronic kidney disease, unspecified: Secondary | ICD-10-CM | POA: Diagnosis not present

## 2018-07-04 DIAGNOSIS — I130 Hypertensive heart and chronic kidney disease with heart failure and stage 1 through stage 4 chronic kidney disease, or unspecified chronic kidney disease: Secondary | ICD-10-CM | POA: Diagnosis not present

## 2018-07-04 DIAGNOSIS — J9611 Chronic respiratory failure with hypoxia: Secondary | ICD-10-CM | POA: Diagnosis not present

## 2018-07-07 DIAGNOSIS — J9611 Chronic respiratory failure with hypoxia: Secondary | ICD-10-CM | POA: Diagnosis not present

## 2018-07-07 DIAGNOSIS — I130 Hypertensive heart and chronic kidney disease with heart failure and stage 1 through stage 4 chronic kidney disease, or unspecified chronic kidney disease: Secondary | ICD-10-CM | POA: Diagnosis not present

## 2018-07-07 DIAGNOSIS — I5042 Chronic combined systolic (congestive) and diastolic (congestive) heart failure: Secondary | ICD-10-CM | POA: Diagnosis not present

## 2018-07-07 DIAGNOSIS — R1312 Dysphagia, oropharyngeal phase: Secondary | ICD-10-CM | POA: Diagnosis not present

## 2018-07-07 DIAGNOSIS — Z9981 Dependence on supplemental oxygen: Secondary | ICD-10-CM | POA: Diagnosis not present

## 2018-07-07 DIAGNOSIS — N189 Chronic kidney disease, unspecified: Secondary | ICD-10-CM | POA: Diagnosis not present

## 2018-07-07 DIAGNOSIS — D62 Acute posthemorrhagic anemia: Secondary | ICD-10-CM | POA: Diagnosis not present

## 2018-07-07 DIAGNOSIS — E1122 Type 2 diabetes mellitus with diabetic chronic kidney disease: Secondary | ICD-10-CM | POA: Diagnosis not present

## 2018-07-17 ENCOUNTER — Non-Acute Institutional Stay (SKILLED_NURSING_FACILITY): Payer: Medicare Other | Admitting: Family

## 2018-07-17 ENCOUNTER — Encounter: Payer: Self-pay | Admitting: Family

## 2018-07-17 DIAGNOSIS — N183 Chronic kidney disease, stage 3 unspecified: Secondary | ICD-10-CM

## 2018-07-17 DIAGNOSIS — E1122 Type 2 diabetes mellitus with diabetic chronic kidney disease: Secondary | ICD-10-CM

## 2018-07-17 DIAGNOSIS — F418 Other specified anxiety disorders: Secondary | ICD-10-CM | POA: Diagnosis not present

## 2018-07-17 DIAGNOSIS — D509 Iron deficiency anemia, unspecified: Secondary | ICD-10-CM

## 2018-07-17 DIAGNOSIS — I5042 Chronic combined systolic (congestive) and diastolic (congestive) heart failure: Secondary | ICD-10-CM

## 2018-07-17 NOTE — Progress Notes (Signed)
Location:  [facility] Room Number: 58 Place of Service:  SNF [id number]) Provider:Maleyah Evans C,NP  [name], MD  Patient Care Team: [name], MD as PCP - General (Internal Medicine) [name], MD as PCP - Cardiology (Cardiology) Mast, Man X, NP as Nurse Practitioner (Internal Medicine) Zanna Hawn, [name], NP as Nurse Practitioner (Family Medicine)  Extended Emergency Contact Information Primary Emergency Contact: [name] Address: [address], [place] of [place] Phone: [phone] Mobile Phone: [phone] Relation: Spouse Secondary Emergency Contact: [name] [place] of [place] Phone: [phone] Work Phone: [phone] Relation: Daughter  Code Status: DNR  Goals of care: Advanced Directive information Advanced Directives 07/17/2018  Does Patient Have a Medical Advance Directive? Yes  Type of Paramedic of [place];Living will  Does patient want to make changes to medical advance directive? No - Patient declined  Copy of [place] in Chart? Yes - validated most recent copy scanned in chart (See row information)  Would patient like information on creating a medical advance directive? No - Patient declined  Pre-existing out of facility DNR order (yellow form or pink MOST form) -     Chief Complaint  Patient presents with  . Medical Management of Chronic Issues    Routine visit     HPI:  Pt is a 85 y.o. male seen today for medical management of chronic diseases.He has a medical history of Type 2 DM, combined systolic and diastolic congestive heart failure,Hyperlipidemia,depression with anxiety,Anemia,Alzheimer disease among other conditions.He is seen in his room today with sitter and facility CNA present at bedside.He continues to spend most time in the bed staff states bends over when he sits on wheelchair.He takes off his oxygen most  times.His previous left gluteal wound now resolved. He has had no recent fall episodes.He has had an additional 2.7 lbs weight loss over two months : wt 179 lbs (04/02/2018);wt 175 lbs (05/07/2018);wt 175.1 lbs (06/16/2018) and wt 172 lbs (07/16/2018).Nursing staff states patient has good appetite. His CBG log reviewed readings stable in the 120's-170's.  Past Medical History:  Diagnosis Date  . Abnormality of gait   . Backache, unspecified   . CHF (congestive heart failure) ([place])   . Colon polyps    adenomatous  . Dementia ([place]) 11/12/2012  . Depression   . Diabetes mellitus without complication ([place])   . Diverticulosis   . Essential and other specified forms of tremor   . Hemorrhoids   . History of bilateral hip replacements 11/12/2012  . Memory loss   . 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 Pain in joint, pelvic region and thigh   . Skin cancer of scalp   . Spinal stenosis, lumbar region, without neurogenic claudication   . Unspecified hereditary and idiopathic peripheral neuropathy    Past Surgical History:  Procedure Laterality Date  . CATARACT EXTRACTION, BILATERAL  2012  . JOINT REPLACEMENT    . RETINAL LASER PROCEDURE  2012   retinal wrinkle  . SKIN CANCER EXCISION    . TOTAL HIP ARTHROPLASTY Left 2000  . TOTAL HIP ARTHROPLASTY (aka REPLACEMENT) Right 2011    Allergies  Allergen Reactions  . Axona [Bacid] Other (See Comments)    Unknown per MAR  . Gabapentin Other (See Comments)    Hallucinations    Outpatient Encounter Medications as of 07/17/2018  Medication Sig  .  albuterol (PROVENTIL) (2.5 MG/3ML) 0.083% nebulizer solution Take 3 mLs (2.5 mg total) by nebulization every 4 (four) hours as needed for wheezing or shortness of breath.  Marland Kitchen buPROPion (WELLBUTRIN SR) 100 MG 12 hr tablet Take 100 mg by mouth daily.  . carvedilol (COREG) 3.125 MG tablet Take 1 tablet (3.125 mg total) by mouth 2 (two) times daily with a meal.  .  Cholecalciferol (VITAMIN D) 1000 UNITS capsule Take 1,000 Units by mouth daily.    Marland Kitchen docusate sodium (COLACE) 100 MG capsule Take 100 mg by mouth daily.  Marland Kitchen donepezil (ARICEPT) 23 MG TABS tablet Take 1 tablet (23 mg total) by mouth daily.  . dorzolamide-timolol (COSOPT) 22.3-6.8 MG/ML ophthalmic solution Place 1 drop into the left eye 2 (two) times daily.   . ferrous sulfate 325 (65 FE) MG tablet Take 325 mg by mouth daily with breakfast.  . Flaxseed, Linseed, (FLAXSEED OIL) 1000 MG CAPS Take 1,000 mg by mouth daily.    . folic acid (FOLVITE) 831 MCG tablet Take 800 mcg by mouth every evening.   . furosemide (LASIX) 20 MG tablet Take 1 tablet (20 mg total) by mouth daily.  . memantine (NAMENDA XR) 28 MG CP24 24 hr capsule Take 1 capsule (28 mg total) by mouth daily.  . metFORMIN (GLUCOPHAGE) 500 MG tablet Take 500 mg by mouth 2 (two) times daily with a meal.  . Multiple Vitamin (MULTIVITAMIN) tablet Take 1 tablet by mouth daily.    Marland Kitchen OVER THE COUNTER MEDICATION Apply 41 % topically as needed. Aquaphor Healing(white petrolatum) ointment apply to dry or red skin  . pantoprazole (PROTONIX) 40 MG tablet Take 1 tablet (40 mg total) by mouth daily.  . Probiotic Product (ALIGN) 4 MG CAPS Take 4 mg by mouth daily.  . simvastatin (ZOCOR) 5 MG tablet Take 5 mg by mouth daily.  Marland Kitchen zinc oxide 20 % ointment Apply 1 application topically as needed for irritation. To buttocks after every incontinent episode and as needed for redness   No facility-administered encounter medications on file as of 07/17/2018.     Review of Systems  Unable to perform ROS: Dementia (additional information provided by Nurse )    Immunization History  Administered Date(s) Administered  . Influenza-Unspecified 01/10/2017, 01/13/2018  . PPD Test 06/24/2016  . Pneumococcal-Unspecified 06/26/2012  . Td 12/02/2011  . Zoster Recombinat (Shingrix) 06/10/2017, 08/12/2017   Pertinent  Health Maintenance Due  Topic Date Due  .  OPHTHALMOLOGY EXAM  06/06/1940  . URINE MICROALBUMIN  06/06/1940  . HEMOGLOBIN A1C  06/03/2018  . FOOT EXAM  08/07/2018  . INFLUENZA VACCINE  11/01/2018  . PNA vac Low Risk Adult  Completed   Fall Risk  12/13/2017 10/14/2017 12/07/2016 01/19/2016 07/21/2015  Falls in the past year? No No No Yes Yes  Number falls in past yr: - - - 1 1  Injury with Fall? - - - No No  Risk for fall due to : - Impaired balance/gait - Impaired balance/gait -  Follow up - - - Falls prevention discussed Falls prevention discussed    Vitals:   07/17/18 0906  BP: 107/69  Pulse: 95  Resp: 18  Temp: (!) 97 F (36.1 C)  SpO2: 96%  Weight: 172 lb 6.4 oz (78.2 kg)  Height: 6\' 1"  (1.854 m)   Body mass index is 22.75 kg/m. Physical Exam Vitals signs and nursing note reviewed.  Constitutional:      General: He is not in acute distress.    Appearance:  He is normal weight. He is not ill-appearing.  HENT:     Head: Normocephalic.     Right Ear: Tympanic membrane, ear canal and external ear normal. There is no impacted cerumen.     Left Ear: Tympanic membrane, ear canal and external ear normal. There is no impacted cerumen.     Nose: Nose normal. No congestion or rhinorrhea.     Mouth/Throat:     Mouth: Mucous membranes are moist.     Pharynx: Oropharynx is clear. No oropharyngeal exudate or posterior oropharyngeal erythema.  Eyes:     General: No scleral icterus.       Right eye: No discharge.        Left eye: No discharge.     Conjunctiva/sclera: Conjunctivae normal.     Pupils: Pupils are equal, round, and reactive to light.  Neck:     Musculoskeletal: Normal range of motion. No neck rigidity or muscular tenderness.     Vascular: No carotid bruit.  Cardiovascular:     Rate and Rhythm: Normal rate and regular rhythm.     Pulses: Normal pulses.     Heart sounds: Normal heart sounds. No murmur. No friction rub. No gallop.   Pulmonary:     Effort: Pulmonary effort is normal. No respiratory distress.      Breath sounds: Normal breath sounds. No wheezing, rhonchi or rales.     Comments: Oxygen 2 liters via nasal cannula in place.  Chest:     Chest wall: No tenderness.  Abdominal:     General: Bowel sounds are normal. There is no distension.     Palpations: Abdomen is soft. There is no mass.     Tenderness: There is no abdominal tenderness. There is no right CVA tenderness, left CVA tenderness, guarding or rebound.  Genitourinary:    Comments: Incontinent for bowel and bladder  Musculoskeletal:        General: No swelling or tenderness.     Right lower leg: No edema.     Left lower leg: No edema.     Comments: Moves x 4 extremities requires assistance with transfers.  Lymphadenopathy:     Cervical: No cervical adenopathy.  Skin:    General: Skin is warm and dry.     Coloration: Skin is not pale.     Findings: No lesion or rash.     Comments: Sacral and left gluteal skin redness.  Neurological:     Mental Status: He is alert. Mental status is at baseline.     Cranial Nerves: No cranial nerve deficit.     Sensory: No sensory deficit.     Motor: No weakness.     Coordination: Coordination normal.     Gait: Gait abnormal.  Psychiatric:        Mood and Affect: Mood normal.        Speech: Speech normal.        Behavior: Behavior normal.        Thought Content: Thought content normal.        Cognition and Memory: Memory is impaired.        Judgment: Judgment normal.    Labs reviewed: Recent Labs    04/26/18 0316  05/13/18 0251  05/19/18 0306  05/21/18 0338 05/22/18 1129 05/23/18 0218 05/28/18  NA 148*   < > 142   < > 150*   < > 148* 146* 148* 140  K 3.7   < > 4.1   < > 3.7   < >  3.5 3.5 3.1* 3.5  CL 115*   < > 115*   < > 117*   < > 117* 113* 110  --   CO2 26   < > 21*   < > 28   < > 26 25 29   --   GLUCOSE 168*   < > 104*   < > 152*   < > 157* 217* 157*  --   BUN 34*   < > 18   < > 20   < > 19 20 19 12   CREATININE 1.32*   < > 1.00   < > 0.97   < > 0.92 1.01 1.01 0.8   CALCIUM 8.2*   < > 7.8*   < > 8.0*   < > 8.0* 8.1* 8.4*  --   MG 2.2  --  2.1  --  2.1  --   --   --   --   --   PHOS 3.3  --  3.1  --   --   --   --   --   --   --    < > = values in this interval not displayed.   Recent Labs    04/27/18 0321 05/01/18 0034 05/11/18 1626 05/13/18 0251 05/28/18  AST 44* 47* 37  --  12*  ALT 40 60* 27  --  13  ALKPHOS 91 111 86  --  96  BILITOT 0.5 0.6 0.5  --   --   PROT 5.5* 6.2* 5.4*  --   --   ALBUMIN 2.5* 2.6* 2.5* 2.0*  --    Recent Labs    05/11/18 1626 05/13/18 0251 05/13/18 0505  05/20/18 0255 05/21/18 0338 05/22/18 1129 05/28/18  WBC 15.6* 7.8 8.2   < > 8.8 7.4 9.4 8.2  NEUTROABS 13.2* 5.1 5.7  --   --   --   --   --   HGB 7.6* 5.5* 5.5*   < > 8.7* 8.1* 9.0* 10.1*  HCT 26.0* 19.5* 19.1*   < > 30.1* 28.5* 30.8* 32*  MCV 91.2 92.9 92.7   < > 96.2 95.6 95.7  --   PLT 182 130* 126*   < > 155 132* 142* 180   < > = values in this interval not displayed.   Lab Results  Component Value Date   TSH 2.61 08/08/2017   Lab Results  Component Value Date   HGBA1C 6.2 06/23/2018   Lab Results  Component Value Date   CHOL 158 06/23/2018   HDL 39 06/23/2018   LDLCALC 99 06/23/2018   TRIG 100 06/23/2018    Significant Diagnostic Results in last 30 days:  No results found.  Assessment/Plan 1. Chronic combined systolic and diastolic congestive heart failure (HCC) No signs of fluid overload.lungs CTA,no edema or shortness of breath.continue on coreg 3.125 mg tablet twice daily.Reduce Furosemide from 20 mg tablet daily to 10 mg tablet daily.continue to monitor weight three times per week.  2. Type 2 diabetes mellitus with stage 3 chronic kidney disease, without long-term current use of insulin (HCC) Lab Results  Component Value Date   HGBA1C 6.2 06/23/2018   CBG readings reviewed stable.No signs of hypo/hyperglycemia reported.Reduce Metformin 500 mg tablet twice daily to 250 mg tablet one by mouth twice daily.continue on Statin.will  need annual eye and foot exam with ophthalmology and podiatrist when COVID-19 quarantine restrictions are over.   3. Depression with anxiety Mood stable.continue on Wellbutrin SR 100 mg  tablet daily.moniotor for mood changes.  4. Iron deficiency anemia, unspecified iron deficiency anemia type Hgb 10.1(05/28/2018).continue on ferrous sulfate 325 mg tablet daily and Folic acid 800 mcg tablet daily.  Family/ staff Communication:  Reviewed plan of care with patient and facility Nurse.   Labs/tests ordered: None

## 2018-07-22 DIAGNOSIS — E1122 Type 2 diabetes mellitus with diabetic chronic kidney disease: Secondary | ICD-10-CM | POA: Diagnosis not present

## 2018-07-22 DIAGNOSIS — I130 Hypertensive heart and chronic kidney disease with heart failure and stage 1 through stage 4 chronic kidney disease, or unspecified chronic kidney disease: Secondary | ICD-10-CM | POA: Diagnosis not present

## 2018-07-22 DIAGNOSIS — N189 Chronic kidney disease, unspecified: Secondary | ICD-10-CM | POA: Diagnosis not present

## 2018-07-22 DIAGNOSIS — R1312 Dysphagia, oropharyngeal phase: Secondary | ICD-10-CM | POA: Diagnosis not present

## 2018-07-22 DIAGNOSIS — Z9981 Dependence on supplemental oxygen: Secondary | ICD-10-CM | POA: Diagnosis not present

## 2018-07-22 DIAGNOSIS — J9611 Chronic respiratory failure with hypoxia: Secondary | ICD-10-CM | POA: Diagnosis not present

## 2018-07-22 DIAGNOSIS — D62 Acute posthemorrhagic anemia: Secondary | ICD-10-CM | POA: Diagnosis not present

## 2018-07-22 DIAGNOSIS — I5042 Chronic combined systolic (congestive) and diastolic (congestive) heart failure: Secondary | ICD-10-CM | POA: Diagnosis not present

## 2018-07-28 IMAGING — DX DG CHEST 1V PORT
2 series · 2 of 2 positions shown · non-contrast
Comparison: Portable exam 6618 hours compared to 06/22/2016

CLINICAL DATA: Altered mental status since last night, increased
shortness of breath, cough, and congestion for past week, history
dementia

EXAM:
PORTABLE CHEST 1 VIEW

[chest ap (1 of 2)]
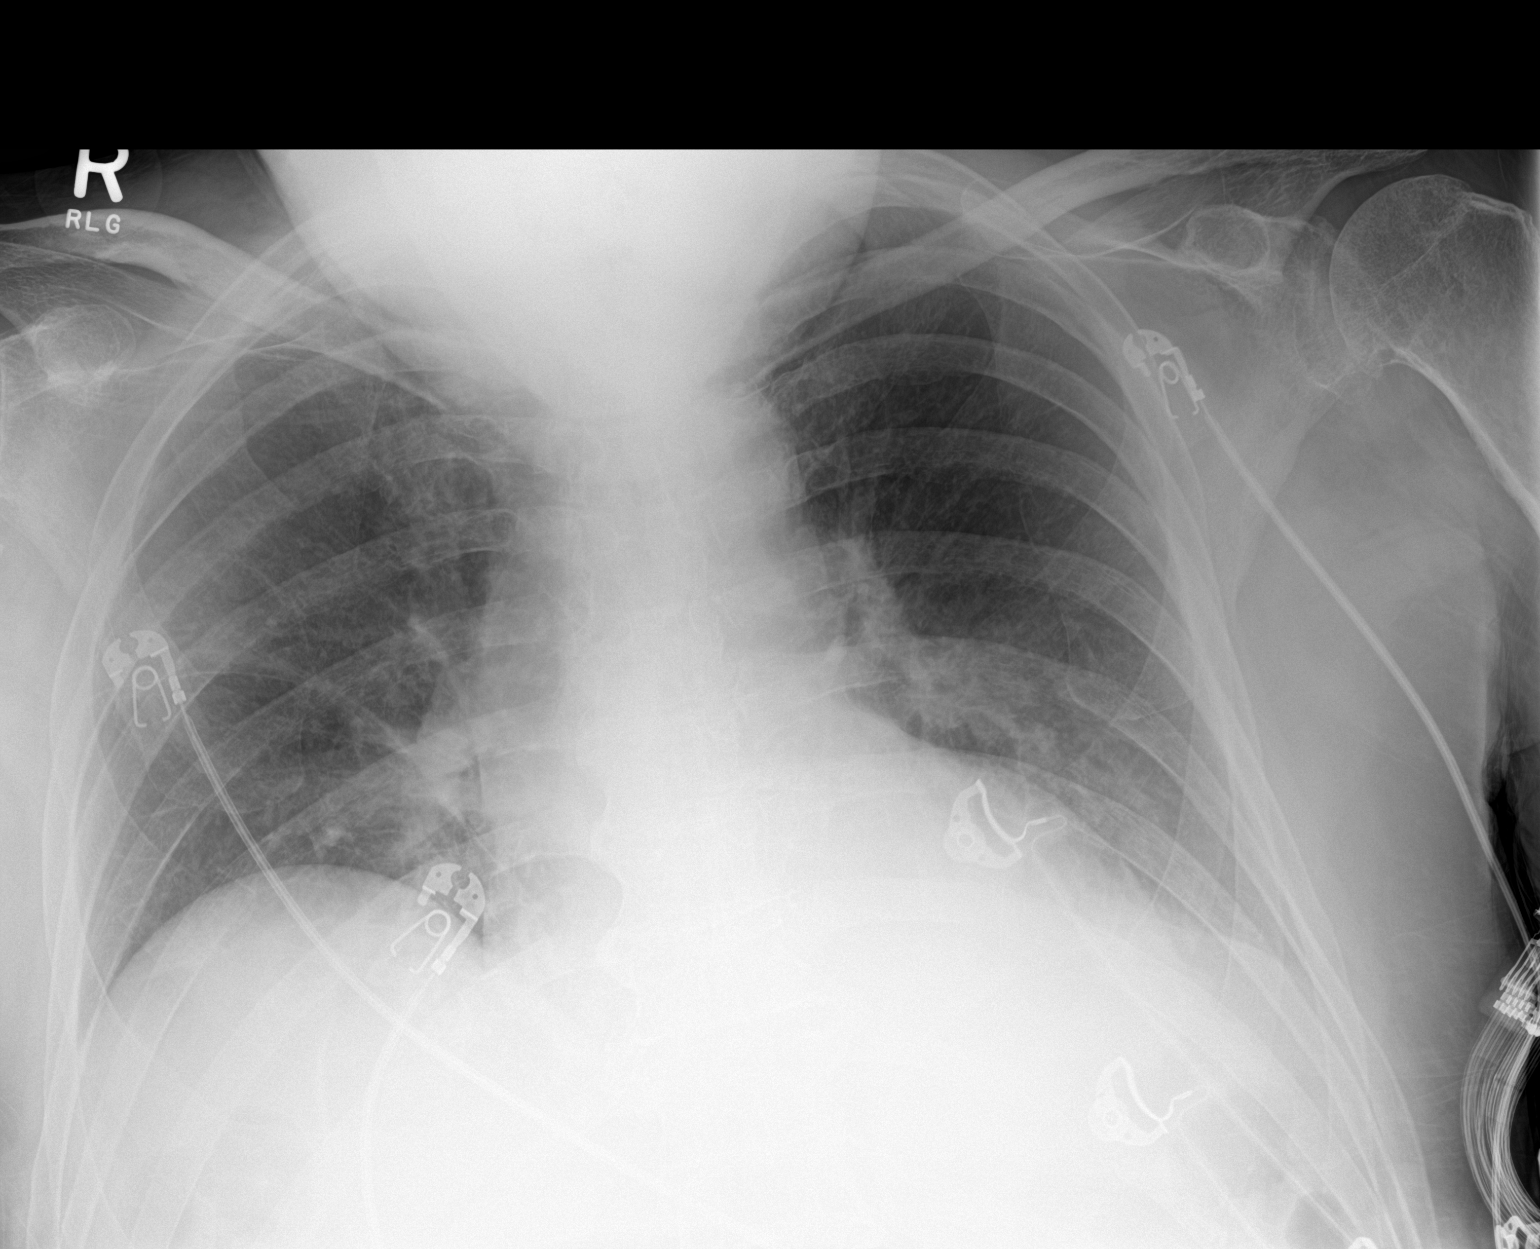

[chest ap (2 of 2)]
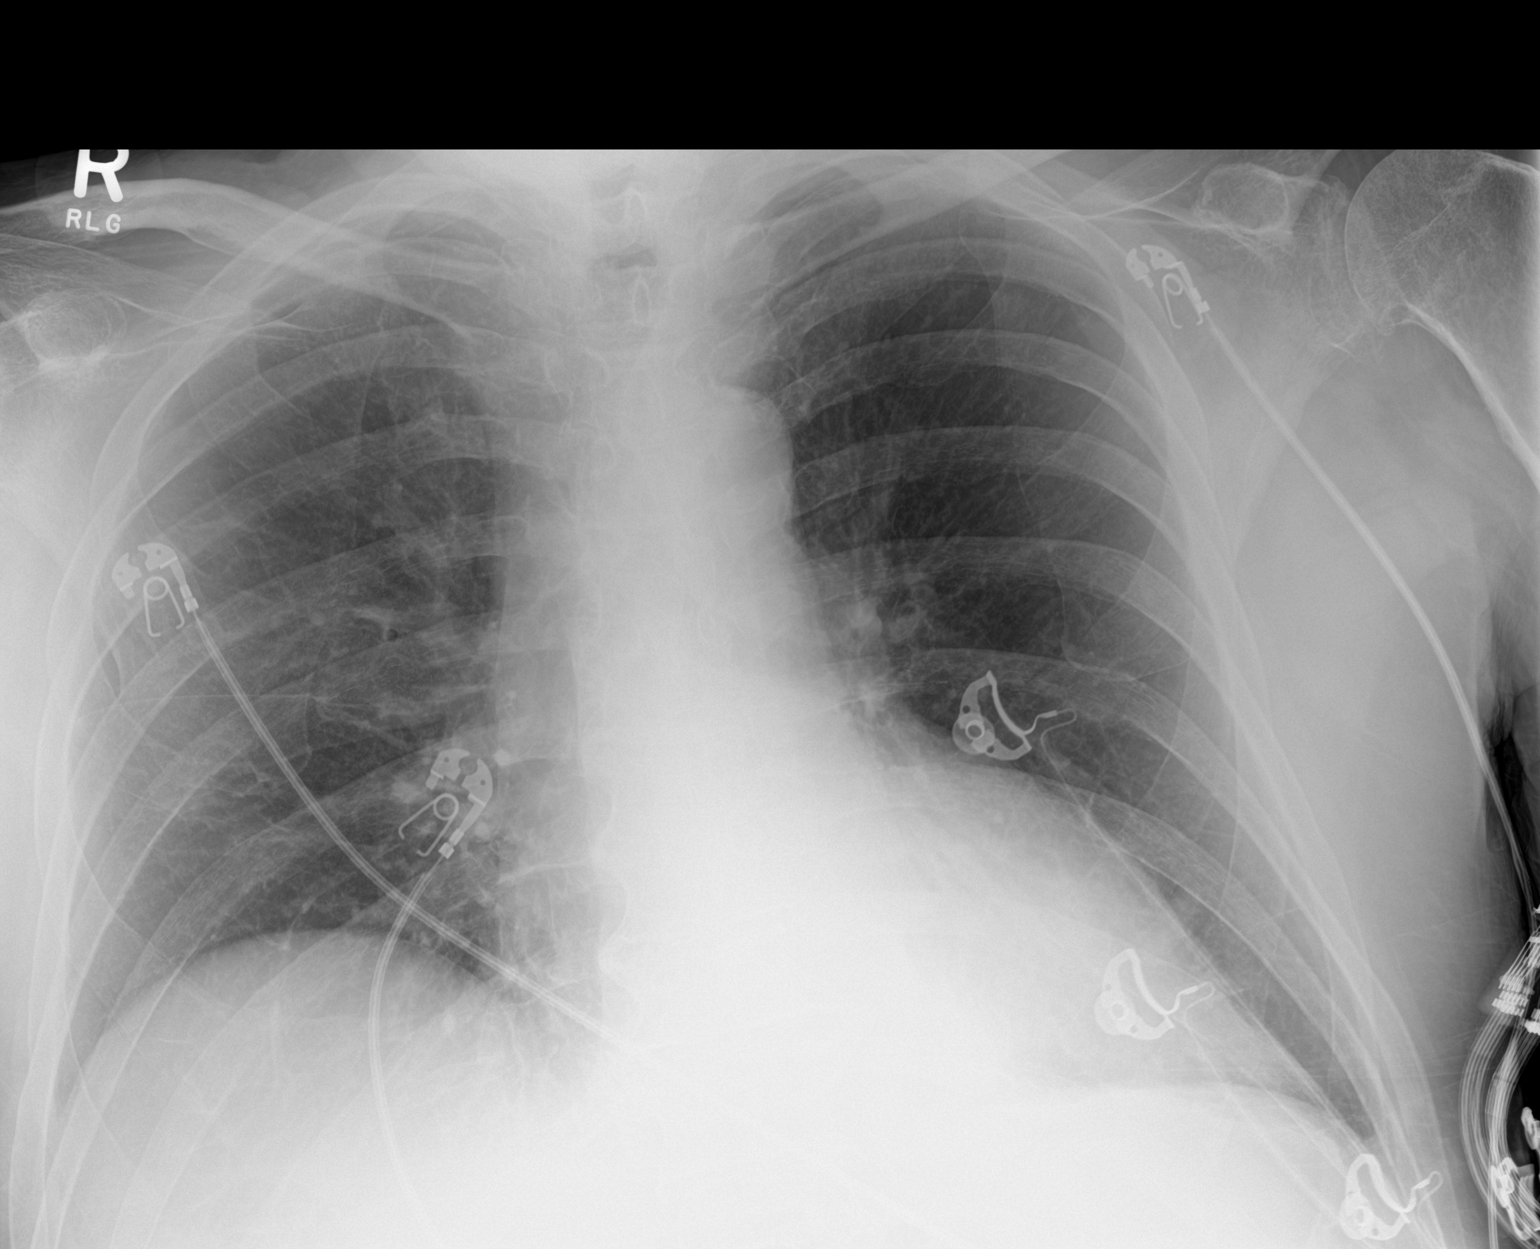

[2 of 2 positions shown; findings below may reference images not displayed]

FINDINGS: Borderline enlargement of cardiac silhouette.

Mediastinal contours and pulmonary vascularity normal.

Low lung volumes with bibasilar atelectasis.

Remaining lungs clear.

No definite pleural effusion or pneumothorax.
IMPRESSION: Decreased lung volumes with bibasilar atelectasis.

## 2018-08-13 ENCOUNTER — Non-Acute Institutional Stay (SKILLED_NURSING_FACILITY): Payer: Medicare Other | Admitting: Internal Medicine

## 2018-08-13 ENCOUNTER — Encounter: Payer: Self-pay | Admitting: Internal Medicine

## 2018-08-13 DIAGNOSIS — R1310 Dysphagia, unspecified: Secondary | ICD-10-CM

## 2018-08-13 DIAGNOSIS — I5042 Chronic combined systolic (congestive) and diastolic (congestive) heart failure: Secondary | ICD-10-CM | POA: Diagnosis not present

## 2018-08-13 DIAGNOSIS — I10 Essential (primary) hypertension: Secondary | ICD-10-CM | POA: Diagnosis not present

## 2018-08-13 DIAGNOSIS — E1169 Type 2 diabetes mellitus with other specified complication: Secondary | ICD-10-CM

## 2018-08-13 DIAGNOSIS — E1122 Type 2 diabetes mellitus with diabetic chronic kidney disease: Secondary | ICD-10-CM | POA: Diagnosis not present

## 2018-08-13 DIAGNOSIS — I5040 Unspecified combined systolic (congestive) and diastolic (congestive) heart failure: Secondary | ICD-10-CM | POA: Diagnosis not present

## 2018-08-13 DIAGNOSIS — E785 Hyperlipidemia, unspecified: Secondary | ICD-10-CM

## 2018-08-13 NOTE — Progress Notes (Signed)
Location:  [facility] Room Number: 36/A Place of Service:  SNF (31) Provider:Gupta Meredith Staggers L,MD   [name], MD  Patient Care Team: [name], MD as PCP - General (Internal Medicine) [name], MD as PCP - Cardiology (Cardiology) Mast, Man X, NP as Nurse Practitioner (Internal Medicine) [name], [name], NP as Nurse Practitioner (Family Medicine)  Extended Emergency Contact Information Primary Emergency Contact: [name] Address: [place], [place] Phone: [phone] Mobile Phone: [phone] Relation: Spouse Secondary Emergency Contact: [name] [place] Phone: [phone] Work Phone: [phone] Relation: Daughter  Code Status:DNR  Goals of care: Advanced Directive information Advanced Directives 08/13/2018  Does Patient Have a Medical Advance Directive? Yes  Type of Paramedic of [place];Living will  Does patient want to make changes to medical advance directive? No - Patient declined  Copy of [place] in Chart? Yes - validated most recent copy scanned in chart (See row information)  Would patient like information on creating a medical advance directive? No - Patient declined  Pre-existing out of facility DNR order (yellow form or pink MOST form) -     Chief Complaint  Patient presents with  . Medical Management of Chronic Issues    Routine visit     HPI:  Pt is a 85 y.o. male seen today for medical management of chronic diseases.  Patient is long term resident of facility  In Past Few months patient has had recurrent admissions to the hospital  Last one from 02/09-02/24 for Aspiration Pneumonia with respiratory Failure and Acute Blood loss Anemia. He was also in Hospital from 01/24-01/29 for Aspiration Pneumonia.with Respiratory failure He came to facility and was again send to the hospital on  01/30 for episode of Choking and Coughing.He was discharged on 02/03 and then send again on 02/09.  Patient also has a history of type 2 diabetes, Advanced dementia, hypertension, systolic CHF, depression, anemia He has done well since then. He still coughs when he eats but has not developed any acute infection. He did have some rales yesterday per nurses and they got resolved with Nebs His Hgb has been stable He does have rash on his back and Bottom with excoriation and skin Tear. Appetite is fair. He has gained some weight since his lasix was reduced recently BS are mostly b/w 150-200 Patient was responding Appropriately which is new for him. Usually he would not respond He c/o SOB and Cough No Fever or chest pain. No Nausea or vomiting       Past Medical History:  Diagnosis Date  . Abnormality of gait   . Backache, unspecified   . CHF (congestive heart failure) ([place])   . Colon polyps    adenomatous  . Dementia ([place]) 11/12/2012  . Depression   . Diabetes mellitus without complication ([place])   . Diverticulosis   . Essential and other specified forms of tremor   . Hemorrhoids   . History of bilateral hip replacements 11/12/2012  . Memory loss   . 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 Pain in joint, pelvic region and thigh   . Skin cancer of scalp   . Spinal stenosis, lumbar region, without neurogenic claudication   . Unspecified hereditary and idiopathic peripheral neuropathy    Past Surgical History:  Procedure Laterality Date  . CATARACT  EXTRACTION, BILATERAL  2012  . JOINT REPLACEMENT    . RETINAL LASER PROCEDURE  2012   retinal wrinkle  . SKIN CANCER EXCISION    . TOTAL HIP ARTHROPLASTY Left 2000  . TOTAL HIP ARTHROPLASTY (aka REPLACEMENT) Right 2011    Allergies  Allergen Reactions  . Axona [Bacid] Other (See Comments)    Unknown per MAR  . Gabapentin Other (See Comments)    Hallucinations    Outpatient Encounter  Medications as of 08/13/2018  Medication Sig  . albuterol (PROVENTIL) (2.5 MG/3ML) 0.083% nebulizer solution Take 3 mLs (2.5 mg total) by nebulization every 4 (four) hours as needed for wheezing or shortness of breath.  Marland Kitchen buPROPion (WELLBUTRIN SR) 100 MG 12 hr tablet Take 100 mg by mouth daily.  . carvedilol (COREG) 3.125 MG tablet Take 1 tablet (3.125 mg total) by mouth 2 (two) times daily with a meal.  . Cholecalciferol (VITAMIN D) 1000 UNITS capsule Take 1,000 Units by mouth daily.    Marland Kitchen docusate sodium (COLACE) 100 MG capsule Take 100 mg by mouth daily.  Marland Kitchen donepezil (ARICEPT) 23 MG TABS tablet Take 1 tablet (23 mg total) by mouth daily.  . dorzolamide-timolol (COSOPT) 22.3-6.8 MG/ML ophthalmic solution Place 1 drop into the left eye 2 (two) times daily.   . ferrous sulfate 325 (65 FE) MG tablet Take 325 mg by mouth daily with breakfast.  . Flaxseed, Linseed, (FLAXSEED OIL) 1000 MG CAPS Take 1,000 mg by mouth daily.    . folic acid (FOLVITE) 149 MCG tablet Take 800 mcg by mouth every evening.   . furosemide (LASIX) 20 MG tablet Take 1 tablet (20 mg total) by mouth daily.  . memantine (NAMENDA XR) 28 MG CP24 24 hr capsule Take 1 capsule (28 mg total) by mouth daily.  . metFORMIN (GLUCOPHAGE) 500 MG tablet Take 500 mg by mouth 2 (two) times daily with a meal.  . Multiple Vitamin (MULTIVITAMIN) tablet Take 1 tablet by mouth daily.    Marland Kitchen nystatin cream (MYCOSTATIN) Apply 1 application topically 2 (two) times daily. Apply to affected areas on red gluteal areas twice daily for 14 days  . OVER THE COUNTER MEDICATION Apply 41 % topically as needed. Aquaphor Healing(white petrolatum) ointment apply to dry or red skin  . pantoprazole (PROTONIX) 40 MG tablet Take 1 tablet (40 mg total) by mouth daily.  . Probiotic Product (ALIGN) 4 MG CAPS Take 4 mg by mouth daily.  . simvastatin (ZOCOR) 5 MG tablet Take 5 mg by mouth daily.  Marland Kitchen zinc oxide 20 % ointment Apply 1 application topically as needed for  irritation. To buttocks after every incontinent episode and as needed for redness   No facility-administered encounter medications on file as of 08/13/2018.     Review of Systems  Constitutional: Negative.   HENT: Negative.   Respiratory: Positive for cough, shortness of breath and wheezing.   Cardiovascular: Negative.   Gastrointestinal: Negative.   Genitourinary: Negative.   Musculoskeletal: Negative.   Skin: Positive for color change.  Neurological: Positive for weakness.  Psychiatric/Behavioral: Positive for confusion.    Immunization History  Administered Date(s) Administered  . Influenza-Unspecified 01/10/2017, 01/13/2018  . PPD Test 06/24/2016  . Pneumococcal-Unspecified 06/26/2012  . Td 12/02/2011  . Zoster Recombinat (Shingrix) 06/10/2017, 08/12/2017   Pertinent  Health Maintenance Due  Topic Date Due  . OPHTHALMOLOGY EXAM  06/06/1940  . URINE MICROALBUMIN  06/06/1940  . FOOT EXAM  08/07/2018  . INFLUENZA VACCINE  11/01/2018  . HEMOGLOBIN A1C  12/24/2018  . PNA vac Low Risk Adult  Completed   Fall Risk  12/13/2017 10/14/2017 12/07/2016 01/19/2016 07/21/2015  Falls in the past year? No No No Yes Yes  Number falls in past yr: - - - 1 1  Injury with Fall? - - - No No  Risk for fall due to : - Impaired balance/gait - Impaired balance/gait -  Follow up - - - Falls prevention discussed Falls prevention discussed   Functional Status Survey:    Vitals:   08/13/18 0954  BP: (!) 101/57  Pulse: 94  Resp: 18  Temp: (!) 97.5 F (36.4 C)  SpO2: 94%  Weight: 172 lb 1.6 oz (78.1 kg)  Height: 6\' 1"  (1.854 m)   Body mass index is 22.71 kg/m. Physical Exam Vitals signs reviewed.  Constitutional:      Appearance: Normal appearance.  HENT:     Head: Normocephalic.     Nose: Nose normal.     Mouth/Throat:     Mouth: Mucous membranes are moist.     Pharynx: Oropharynx is clear.  Neck:     Musculoskeletal: Neck supple.  Cardiovascular:     Rate and Rhythm: Normal  rate and regular rhythm.     Heart sounds: Normal heart sounds. No murmur.  Pulmonary:     Effort: Pulmonary effort is normal.     Breath sounds: Normal breath sounds.     Comments: Few rales but mostly Clear Abdominal:     General: Abdomen is flat. Bowel sounds are normal.     Palpations: Abdomen is soft.  Musculoskeletal:        General: No swelling.  Skin:    Comments: Has blanchable  redness in his Back and Excoriation rash in the scrotla are with Stage 1-2 Pressure wound in right buttocks  Neurological:     Mental Status: He is alert.     Comments: Patient is more alert. Was responding appropriately.Moving all extremities. Per her sitter in room he stays in bed most of the time on his back  Psychiatric:        Mood and Affect: Mood normal.        Thought Content: Thought content normal.     Labs reviewed: Recent Labs    04/26/18 0316  05/13/18 0251  05/19/18 0306  05/21/18 0338 05/22/18 1129 05/23/18 0218 05/28/18  NA 148*   < > 142   < > 150*   < > 148* 146* 148* 140  K 3.7   < > 4.1   < > 3.7   < > 3.5 3.5 3.1* 3.5  CL 115*   < > 115*   < > 117*   < > 117* 113* 110  --   CO2 26   < > 21*   < > 28   < > 26 25 29   --   GLUCOSE 168*   < > 104*   < > 152*   < > 157* 217* 157*  --   BUN 34*   < > 18   < > 20   < > 19 20 19 12   CREATININE 1.32*   < > 1.00   < > 0.97   < > 0.92 1.01 1.01 0.8  CALCIUM 8.2*   < > 7.8*   < > 8.0*   < > 8.0* 8.1* 8.4*  --   MG 2.2  --  2.1  --  2.1  --   --   --   --   --  PHOS 3.3  --  3.1  --   --   --   --   --   --   --    < > = values in this interval not displayed.   Recent Labs    04/27/18 0321 05/01/18 0034 05/11/18 1626 05/13/18 0251 05/28/18  AST 44* 47* 37  --  12*  ALT 40 60* 27  --  13  ALKPHOS 91 111 86  --  96  BILITOT 0.5 0.6 0.5  --   --   PROT 5.5* 6.2* 5.4*  --   --   ALBUMIN 2.5* 2.6* 2.5* 2.0*  --    Recent Labs    05/11/18 1626 05/13/18 0251 05/13/18 0505  05/20/18 0255 05/21/18 0338 05/22/18 1129  05/28/18  WBC 15.6* 7.8 8.2   < > 8.8 7.4 9.4 8.2  NEUTROABS 13.2* 5.1 5.7  --   --   --   --   --   HGB 7.6* 5.5* 5.5*   < > 8.7* 8.1* 9.0* 10.1*  HCT 26.0* 19.5* 19.1*   < > 30.1* 28.5* 30.8* 32*  MCV 91.2 92.9 92.7   < > 96.2 95.6 95.7  --   PLT 182 130* 126*   < > 155 132* 142* 180   < > = values in this interval not displayed.   Lab Results  Component Value Date   TSH 2.61 08/08/2017   Lab Results  Component Value Date   HGBA1C 6.2 06/23/2018   Lab Results  Component Value Date   CHOL 158 06/23/2018   HDL 39 06/23/2018   LDLCALC 99 06/23/2018   TRIG 100 06/23/2018    Significant Diagnostic Results in last 30 days:  No results found.  Assessment/Plan Cough, Rales and SOB Will get Chest Xray to rule out Aspiration Pneumonia Also Repeat Labs including CBC and CMP Patient continues to be high risk for aspiration .He is on Strict Nectar Thick Diet  Chronic combined systolic and diastolic CHF On Lasix Dose was reduced recently as he had lost weight But is now having  Cough and SOB Will repeat Chest Xray Follow weights Skin Rash Will start him on Diflucan 100 mg PO QD for 5 days Lotrisone for Back with Aquaphor Once his Excoriation is better will try Duoderm for Sacral wound  Upper GI bleed On Iron and Protonix Hgb has been stable on Iron Dysphagia,  Evaluated by Speech On honey thick but continue to be very high risk for aspiration  Hyperlipidemia LDL 99 Continue on low dose of statin Type 2 diabetes mellitus BS in 150-200 Metformin was reduced to 250 mg BID His A1C was 6.2 Will reduce CBG to 3/week Alzheimer's dementia Patient has significant dementia. But he has improved since my last visit . He is more responsive But since he has lost weight will suggest to decrease the dose of Aricept to 10 mg QD and eventually take him off. Continue Namenda for now Nurses will d/w the Wife if she agress with the change He is DNR. Wife wants him to go to  Hospital if needed Family/ staff Communication:   Labs/tests ordered:  CBC,CMP, Chest Xray   Total time spent in this patient care encounter was  45_  minutes; greater than 50% of the visit spent counseling patient and staff, reviewing records , Labs and coordinating care for problems addressed at this encounter.

## 2018-08-14 ENCOUNTER — Telehealth: Payer: Self-pay

## 2018-08-14 DIAGNOSIS — Z79899 Other long term (current) drug therapy: Secondary | ICD-10-CM | POA: Diagnosis not present

## 2018-08-14 DIAGNOSIS — E1122 Type 2 diabetes mellitus with diabetic chronic kidney disease: Secondary | ICD-10-CM | POA: Diagnosis not present

## 2018-08-14 DIAGNOSIS — I130 Hypertensive heart and chronic kidney disease with heart failure and stage 1 through stage 4 chronic kidney disease, or unspecified chronic kidney disease: Secondary | ICD-10-CM | POA: Diagnosis not present

## 2018-08-14 NOTE — Telephone Encounter (Signed)
Phone call placed to patient's wife to follow up regarding Palliative Care referral. VM left with purpose of call and call back information.

## 2018-08-28 DIAGNOSIS — D485 Neoplasm of uncertain behavior of skin: Secondary | ICD-10-CM | POA: Diagnosis not present

## 2018-08-28 DIAGNOSIS — Z85828 Personal history of other malignant neoplasm of skin: Secondary | ICD-10-CM | POA: Diagnosis not present

## 2018-08-28 DIAGNOSIS — D044 Carcinoma in situ of skin of scalp and neck: Secondary | ICD-10-CM | POA: Diagnosis not present

## 2018-08-30 ENCOUNTER — Inpatient Hospital Stay (HOSPITAL_COMMUNITY)
Admission: EM | Admit: 2018-08-30 | Discharge: 2018-09-04 | DRG: 871 | Disposition: A | Discharge status: Skilled Nursing Facility | Payer: Medicare Other | Source: Skilled Nursing Facility | Attending: Internal Medicine | Admitting: Internal Medicine

## 2018-08-30 ENCOUNTER — Encounter (HOSPITAL_COMMUNITY): Payer: Self-pay

## 2018-08-30 DIAGNOSIS — G309 Alzheimer's disease, unspecified: Secondary | ICD-10-CM | POA: Diagnosis not present

## 2018-08-30 DIAGNOSIS — I130 Hypertensive heart and chronic kidney disease with heart failure and stage 1 through stage 4 chronic kidney disease, or unspecified chronic kidney disease: Secondary | ICD-10-CM | POA: Diagnosis not present

## 2018-08-30 DIAGNOSIS — E1169 Type 2 diabetes mellitus with other specified complication: Secondary | ICD-10-CM | POA: Diagnosis present

## 2018-08-30 DIAGNOSIS — R0902 Hypoxemia: Secondary | ICD-10-CM | POA: Diagnosis not present

## 2018-08-30 DIAGNOSIS — Z87891 Personal history of nicotine dependence: Secondary | ICD-10-CM

## 2018-08-30 DIAGNOSIS — Z79899 Other long term (current) drug therapy: Secondary | ICD-10-CM | POA: Diagnosis not present

## 2018-08-30 DIAGNOSIS — J690 Pneumonitis due to inhalation of food and vomit: Secondary | ICD-10-CM | POA: Diagnosis present

## 2018-08-30 DIAGNOSIS — R5381 Other malaise: Secondary | ICD-10-CM | POA: Diagnosis not present

## 2018-08-30 DIAGNOSIS — J189 Pneumonia, unspecified organism: Secondary | ICD-10-CM

## 2018-08-30 DIAGNOSIS — E1122 Type 2 diabetes mellitus with diabetic chronic kidney disease: Secondary | ICD-10-CM | POA: Diagnosis not present

## 2018-08-30 DIAGNOSIS — E1142 Type 2 diabetes mellitus with diabetic polyneuropathy: Secondary | ICD-10-CM | POA: Diagnosis not present

## 2018-08-30 DIAGNOSIS — Z7401 Bed confinement status: Secondary | ICD-10-CM | POA: Diagnosis not present

## 2018-08-30 DIAGNOSIS — Z888 Allergy status to other drugs, medicaments and biological substances status: Secondary | ICD-10-CM | POA: Diagnosis not present

## 2018-08-30 DIAGNOSIS — J9601 Acute respiratory failure with hypoxia: Secondary | ICD-10-CM | POA: Diagnosis not present

## 2018-08-30 DIAGNOSIS — A419 Sepsis, unspecified organism: Secondary | ICD-10-CM | POA: Diagnosis not present

## 2018-08-30 DIAGNOSIS — I5042 Chronic combined systolic (congestive) and diastolic (congestive) heart failure: Secondary | ICD-10-CM | POA: Diagnosis present

## 2018-08-30 DIAGNOSIS — R0603 Acute respiratory distress: Secondary | ICD-10-CM

## 2018-08-30 DIAGNOSIS — R0602 Shortness of breath: Secondary | ICD-10-CM | POA: Diagnosis not present

## 2018-08-30 DIAGNOSIS — R0682 Tachypnea, not elsewhere classified: Secondary | ICD-10-CM

## 2018-08-30 DIAGNOSIS — M2550 Pain in unspecified joint: Secondary | ICD-10-CM | POA: Diagnosis not present

## 2018-08-30 DIAGNOSIS — I10 Essential (primary) hypertension: Secondary | ICD-10-CM | POA: Diagnosis present

## 2018-08-30 DIAGNOSIS — J9610 Chronic respiratory failure, unspecified whether with hypoxia or hypercapnia: Secondary | ICD-10-CM | POA: Diagnosis present

## 2018-08-30 DIAGNOSIS — Z20828 Contact with and (suspected) exposure to other viral communicable diseases: Secondary | ICD-10-CM | POA: Diagnosis present

## 2018-08-30 DIAGNOSIS — Z8719 Personal history of other diseases of the digestive system: Secondary | ICD-10-CM

## 2018-08-30 DIAGNOSIS — R0689 Other abnormalities of breathing: Secondary | ICD-10-CM | POA: Diagnosis not present

## 2018-08-30 DIAGNOSIS — Z66 Do not resuscitate: Secondary | ICD-10-CM | POA: Diagnosis present

## 2018-08-30 DIAGNOSIS — E785 Hyperlipidemia, unspecified: Secondary | ICD-10-CM | POA: Diagnosis not present

## 2018-08-30 DIAGNOSIS — F0280 Dementia in other diseases classified elsewhere without behavioral disturbance: Secondary | ICD-10-CM

## 2018-08-30 DIAGNOSIS — I5043 Acute on chronic combined systolic (congestive) and diastolic (congestive) heart failure: Secondary | ICD-10-CM | POA: Diagnosis present

## 2018-08-30 DIAGNOSIS — Z7984 Long term (current) use of oral hypoglycemic drugs: Secondary | ICD-10-CM | POA: Diagnosis not present

## 2018-08-30 DIAGNOSIS — N189 Chronic kidney disease, unspecified: Secondary | ICD-10-CM | POA: Diagnosis present

## 2018-08-30 DIAGNOSIS — G301 Alzheimer's disease with late onset: Secondary | ICD-10-CM | POA: Diagnosis not present

## 2018-08-30 DIAGNOSIS — R000 Tachycardia, unspecified: Secondary | ICD-10-CM | POA: Diagnosis not present

## 2018-08-30 LAB — CBC WITH DIFFERENTIAL/PLATELET
Abs Immature Granulocytes: 0.07 10*3/uL (ref 0.00–0.07)
Basophils Absolute: 0 10*3/uL (ref 0.0–0.1)
Basophils Relative: 0 %
Eosinophils Absolute: 0.2 10*3/uL (ref 0.0–0.5)
Eosinophils Relative: 1 %
HCT: 44.6 % (ref 39.0–52.0)
Hemoglobin: 14.3 g/dL (ref 13.0–17.0)
Immature Granulocytes: 0 %
Lymphocytes Relative: 9 %
Lymphs Abs: 1.4 10*3/uL (ref 0.7–4.0)
MCH: 30.2 pg (ref 26.0–34.0)
MCHC: 32.1 g/dL (ref 30.0–36.0)
MCV: 94.3 fL (ref 80.0–100.0)
Monocytes Absolute: 0.8 10*3/uL (ref 0.1–1.0)
Monocytes Relative: 5 %
Neutro Abs: 13.7 10*3/uL — ABNORMAL HIGH (ref 1.7–7.7)
Neutrophils Relative %: 85 %
Platelets: 170 10*3/uL (ref 150–400)
RBC: 4.73 MIL/uL (ref 4.22–5.81)
RDW: 13.8 % (ref 11.5–15.5)
WBC: 16.3 10*3/uL — ABNORMAL HIGH (ref 4.0–10.5)
nRBC: 0 % (ref 0.0–0.2)

## 2018-08-30 MED ORDER — NITROGLYCERIN 2 % TD OINT
0.5000 [in_us] | TOPICAL_OINTMENT | Freq: Once | TRANSDERMAL | Status: AC
  Administered 2018-08-30: 0.5 [in_us] via TOPICAL
  Filled 2018-08-30: qty 1

## 2018-08-30 MED ORDER — FUROSEMIDE 10 MG/ML IJ SOLN
40.0000 mg | Freq: Once | INTRAMUSCULAR | Status: AC
  Administered 2018-08-30: 40 mg via INTRAVENOUS
  Filled 2018-08-30: qty 4

## 2018-08-30 NOTE — ED Triage Notes (Signed)
Per [facility], pt from [facility] w/ c/o resp distress/SOB for the past several days. Pt's SpO2 was 85% on 2 lpm via [place] --> 97% via NRB. Lower lung sounds diminished w/ wheezing noted in upper. Pale, cool, and diaphoretic.   150/100 HR 140s RR 50  Tx: 2 nitro 18 ga bilateral wrists

## 2018-08-31 ENCOUNTER — Encounter (HOSPITAL_COMMUNITY): Payer: Self-pay | Admitting: Emergency Medicine

## 2018-08-31 ENCOUNTER — Emergency Department (HOSPITAL_COMMUNITY): Payer: Medicare Other

## 2018-08-31 DIAGNOSIS — G309 Alzheimer's disease, unspecified: Secondary | ICD-10-CM | POA: Diagnosis present

## 2018-08-31 DIAGNOSIS — Z87891 Personal history of nicotine dependence: Secondary | ICD-10-CM | POA: Diagnosis not present

## 2018-08-31 DIAGNOSIS — J690 Pneumonitis due to inhalation of food and vomit: Secondary | ICD-10-CM | POA: Diagnosis present

## 2018-08-31 DIAGNOSIS — G301 Alzheimer's disease with late onset: Secondary | ICD-10-CM

## 2018-08-31 DIAGNOSIS — E1169 Type 2 diabetes mellitus with other specified complication: Secondary | ICD-10-CM | POA: Diagnosis present

## 2018-08-31 DIAGNOSIS — Z66 Do not resuscitate: Secondary | ICD-10-CM | POA: Diagnosis present

## 2018-08-31 DIAGNOSIS — F0280 Dementia in other diseases classified elsewhere without behavioral disturbance: Secondary | ICD-10-CM

## 2018-08-31 DIAGNOSIS — Z20828 Contact with and (suspected) exposure to other viral communicable diseases: Secondary | ICD-10-CM | POA: Diagnosis present

## 2018-08-31 DIAGNOSIS — R0603 Acute respiratory distress: Secondary | ICD-10-CM | POA: Diagnosis present

## 2018-08-31 DIAGNOSIS — N189 Chronic kidney disease, unspecified: Secondary | ICD-10-CM | POA: Diagnosis present

## 2018-08-31 DIAGNOSIS — I5043 Acute on chronic combined systolic (congestive) and diastolic (congestive) heart failure: Secondary | ICD-10-CM | POA: Diagnosis present

## 2018-08-31 DIAGNOSIS — Z79899 Other long term (current) drug therapy: Secondary | ICD-10-CM | POA: Diagnosis not present

## 2018-08-31 DIAGNOSIS — I10 Essential (primary) hypertension: Secondary | ICD-10-CM

## 2018-08-31 DIAGNOSIS — Z8719 Personal history of other diseases of the digestive system: Secondary | ICD-10-CM | POA: Diagnosis not present

## 2018-08-31 DIAGNOSIS — Z7984 Long term (current) use of oral hypoglycemic drugs: Secondary | ICD-10-CM | POA: Diagnosis not present

## 2018-08-31 DIAGNOSIS — Z888 Allergy status to other drugs, medicaments and biological substances status: Secondary | ICD-10-CM | POA: Diagnosis not present

## 2018-08-31 DIAGNOSIS — E1142 Type 2 diabetes mellitus with diabetic polyneuropathy: Secondary | ICD-10-CM | POA: Diagnosis present

## 2018-08-31 DIAGNOSIS — E785 Hyperlipidemia, unspecified: Secondary | ICD-10-CM | POA: Diagnosis present

## 2018-08-31 DIAGNOSIS — A419 Sepsis, unspecified organism: Secondary | ICD-10-CM | POA: Diagnosis present

## 2018-08-31 DIAGNOSIS — E1122 Type 2 diabetes mellitus with diabetic chronic kidney disease: Secondary | ICD-10-CM | POA: Diagnosis present

## 2018-08-31 DIAGNOSIS — I5042 Chronic combined systolic (congestive) and diastolic (congestive) heart failure: Secondary | ICD-10-CM

## 2018-08-31 DIAGNOSIS — I130 Hypertensive heart and chronic kidney disease with heart failure and stage 1 through stage 4 chronic kidney disease, or unspecified chronic kidney disease: Secondary | ICD-10-CM | POA: Diagnosis present

## 2018-08-31 DIAGNOSIS — J9601 Acute respiratory failure with hypoxia: Secondary | ICD-10-CM

## 2018-08-31 LAB — GLUCOSE, CAPILLARY
Glucose-Capillary: 105 mg/dL — ABNORMAL HIGH (ref 70–99)
Glucose-Capillary: 144 mg/dL — ABNORMAL HIGH (ref 70–99)
Glucose-Capillary: 146 mg/dL — ABNORMAL HIGH (ref 70–99)
Glucose-Capillary: 169 mg/dL — ABNORMAL HIGH (ref 70–99)
Glucose-Capillary: 235 mg/dL — ABNORMAL HIGH (ref 70–99)
Glucose-Capillary: 240 mg/dL — ABNORMAL HIGH (ref 70–99)
Glucose-Capillary: 292 mg/dL — ABNORMAL HIGH (ref 70–99)

## 2018-08-31 LAB — RESPIRATORY PANEL BY PCR

## 2018-08-31 LAB — BLOOD GAS, ARTERIAL
Acid-base deficit: 0.3 mmol/L (ref 0.0–2.0)
Bicarbonate: 23.6 mmol/L (ref 20.0–28.0)
Drawn by: 44135
O2 Content: 4 L/min
O2 Saturation: 91.7 %
Patient temperature: 98.6
pCO2 arterial: 37.4 mmHg (ref 32.0–48.0)
pH, Arterial: 7.417 (ref 7.350–7.450)
pO2, Arterial: 61.2 mmHg — ABNORMAL LOW (ref 83.0–108.0)

## 2018-08-31 LAB — TROPONIN I: Troponin I: 0.03 ng/mL (ref <0.03)

## 2018-08-31 LAB — BASIC METABOLIC PANEL
Anion gap: 14 (ref 5–15)
Anion gap: 13 (ref 5–15)
BUN: 23 mg/dL (ref 8–23)
BUN: 25 mg/dL — ABNORMAL HIGH (ref 8–23)
CO2: 20 mmol/L — ABNORMAL LOW (ref 22–32)
CO2: 22 mmol/L (ref 22–32)
Calcium: 8.9 mg/dL (ref 8.9–10.3)
Calcium: 8.8 mg/dL — ABNORMAL LOW (ref 8.9–10.3)
Chloride: 100 mmol/L (ref 98–111)
Chloride: 99 mmol/L (ref 98–111)
Creatinine, Ser: 1 mg/dL (ref 0.61–1.24)
Creatinine, Ser: 1.06 mg/dL (ref 0.61–1.24)
GFR calc Af Amer: >60 mL/min (ref >60)
GFR calc Af Amer: >60 mL/min (ref >60)
GFR calc non Af Amer: >60 mL/min (ref >60)
GFR calc non Af Amer: >60 mL/min (ref >60)
Glucose, Bld: 398 mg/dL — ABNORMAL HIGH (ref 70–99)
Glucose, Bld: 354 mg/dL — ABNORMAL HIGH (ref 70–99)
Potassium: 4.9 mmol/L (ref 3.5–5.1)
Potassium: 4.6 mmol/L (ref 3.5–5.1)
Sodium: 134 mmol/L — ABNORMAL LOW (ref 135–145)
Sodium: 134 mmol/L — ABNORMAL LOW (ref 135–145)

## 2018-08-31 LAB — MRSA PCR SCREENING: MRSA by PCR: POSITIVE — AB

## 2018-08-31 LAB — STREP PNEUMONIAE URINARY ANTIGEN: Strep Pneumo Urinary Antigen: NEGATIVE

## 2018-08-31 LAB — CBC
HCT: 38.9 % — ABNORMAL LOW (ref 39.0–52.0)
Hemoglobin: 12.6 g/dL — ABNORMAL LOW (ref 13.0–17.0)
MCH: 30.1 pg (ref 26.0–34.0)
MCHC: 32.4 g/dL (ref 30.0–36.0)
MCV: 93.1 fL (ref 80.0–100.0)
Platelets: 127 10*3/uL — ABNORMAL LOW (ref 150–400)
RBC: 4.18 MIL/uL — ABNORMAL LOW (ref 4.22–5.81)
RDW: 14 % (ref 11.5–15.5)
WBC: 11.6 10*3/uL — ABNORMAL HIGH (ref 4.0–10.5)
nRBC: 0 % (ref 0.0–0.2)

## 2018-08-31 LAB — BRAIN NATRIURETIC PEPTIDE: B Natriuretic Peptide: 1203.8 pg/mL — ABNORMAL HIGH (ref 0.0–100.0)

## 2018-08-31 LAB — SARS CORONAVIRUS 2 BY RT PCR (HOSPITAL ORDER, PERFORMED IN ~~LOC~~ HOSPITAL LAB): SARS Coronavirus 2: NEGATIVE

## 2018-08-31 LAB — PROCALCITONIN: Procalcitonin: 0.12 ng/mL

## 2018-08-31 LAB — LACTIC ACID, PLASMA
Lactic Acid, Venous: 3 mmol/L (ref 0.5–1.9)
Lactic Acid, Venous: 3.5 mmol/L (ref 0.5–1.9)

## 2018-08-31 MED ORDER — SODIUM CHLORIDE 0.9 % IV SOLN
3.0000 g | Freq: Three times a day (TID) | INTRAVENOUS | Status: DC
  Administered 2018-08-31 – 2018-09-02 (×8): 3 g via INTRAVENOUS
  Filled 2018-08-31 (×11): qty 3

## 2018-08-31 MED ORDER — ONDANSETRON HCL 4 MG PO TABS: 4.0000 mg | ORAL_TABLET | Freq: Four times a day (QID) | ORAL | Status: DC | PRN

## 2018-08-31 MED ORDER — ENOXAPARIN SODIUM 40 MG/0.4ML ~~LOC~~ SOLN: 40.0000 mg | SUBCUTANEOUS | Status: DC

## 2018-08-31 MED ORDER — CHLORHEXIDINE GLUCONATE 0.12 % MT SOLN
15.0000 mL | Freq: Two times a day (BID) | OROMUCOSAL | Status: DC
  Administered 2018-08-31 – 2018-09-04 (×9): 15 mL via OROMUCOSAL
  Filled 2018-08-31 (×8): qty 15

## 2018-08-31 MED ORDER — ACETAMINOPHEN 650 MG RE SUPP
650.0000 mg | Freq: Four times a day (QID) | RECTAL | Status: DC | PRN
  Administered 2018-09-03: 650 mg via RECTAL
  Filled 2018-08-31: qty 1

## 2018-08-31 MED ORDER — ORAL CARE MOUTH RINSE
15.0000 mL | Freq: Two times a day (BID) | OROMUCOSAL | Status: DC
  Administered 2018-08-31 – 2018-09-03 (×7): 15 mL via OROMUCOSAL

## 2018-08-31 MED ORDER — VANCOMYCIN HCL 10 G IV SOLR
1500.0000 mg | Freq: Once | INTRAVENOUS | Status: AC
  Administered 2018-08-31: 01:00:00 1500 mg via INTRAVENOUS
  Filled 2018-08-31: qty 1500

## 2018-08-31 MED ORDER — MUPIROCIN 2 % EX OINT
1.0000 "application " | TOPICAL_OINTMENT | Freq: Two times a day (BID) | CUTANEOUS | Status: DC
  Administered 2018-08-31 – 2018-09-04 (×9): 1 via NASAL
  Filled 2018-08-31: qty 22

## 2018-08-31 MED ORDER — VANCOMYCIN HCL 10 G IV SOLR
1500.0000 mg | INTRAVENOUS | Status: DC
  Administered 2018-09-01 – 2018-09-02 (×2): 1500 mg via INTRAVENOUS
  Filled 2018-08-31 (×2): qty 1500

## 2018-08-31 MED ORDER — INSULIN ASPART 100 UNIT/ML ~~LOC~~ SOLN
0.0000 [IU] | SUBCUTANEOUS | Status: DC
  Administered 2018-08-31: 13:00:00 2 [IU] via SUBCUTANEOUS
  Administered 2018-08-31: 20:00:00 1 [IU] via SUBCUTANEOUS
  Administered 2018-08-31: 04:00:00 5 [IU] via SUBCUTANEOUS
  Administered 2018-08-31: 10:00:00 3 [IU] via SUBCUTANEOUS
  Administered 2018-09-01: 2 [IU] via SUBCUTANEOUS
  Administered 2018-09-01 (×2): 1 [IU] via SUBCUTANEOUS
  Administered 2018-09-01: 5 [IU] via SUBCUTANEOUS
  Administered 2018-09-01: 1 [IU] via SUBCUTANEOUS
  Administered 2018-09-01: 2 [IU] via SUBCUTANEOUS
  Administered 2018-09-02 (×2): 5 [IU] via SUBCUTANEOUS
  Administered 2018-09-02: 2 [IU] via SUBCUTANEOUS
  Administered 2018-09-02: 3 [IU] via SUBCUTANEOUS
  Administered 2018-09-02 – 2018-09-03 (×2): 1 [IU] via SUBCUTANEOUS
  Administered 2018-09-03: 2 [IU] via SUBCUTANEOUS
  Administered 2018-09-03: 1 [IU] via SUBCUTANEOUS
  Administered 2018-09-03: 2 [IU] via SUBCUTANEOUS
  Administered 2018-09-03: 1 [IU] via SUBCUTANEOUS
  Administered 2018-09-03: 3 [IU] via SUBCUTANEOUS
  Administered 2018-09-04 (×3): 1 [IU] via SUBCUTANEOUS
  Administered 2018-09-04 (×2): 3 [IU] via SUBCUTANEOUS

## 2018-08-31 MED ORDER — ACETAMINOPHEN 325 MG PO TABS
650.0000 mg | ORAL_TABLET | Freq: Four times a day (QID) | ORAL | Status: DC | PRN
  Filled 2018-08-31: qty 2

## 2018-08-31 MED ORDER — SODIUM CHLORIDE 0.9 % IV SOLN
1.0000 g | Freq: Once | INTRAVENOUS | Status: DC
  Filled 2018-08-31: qty 1

## 2018-08-31 MED ORDER — SODIUM CHLORIDE 0.9 % IV SOLN
2.0000 g | Freq: Once | INTRAVENOUS | Status: AC
  Administered 2018-08-31: 01:00:00 2 g via INTRAVENOUS
  Filled 2018-08-31: qty 2

## 2018-08-31 MED ORDER — ONDANSETRON HCL 4 MG/2ML IJ SOLN: 4.0000 mg | Freq: Four times a day (QID) | INTRAMUSCULAR | Status: DC | PRN

## 2018-08-31 MED ORDER — CHLORHEXIDINE GLUCONATE CLOTH 2 % EX PADS
6.0000 | MEDICATED_PAD | Freq: Every day | CUTANEOUS | Status: AC
  Administered 2018-08-31 – 2018-09-04 (×5): 6 via TOPICAL

## 2018-08-31 NOTE — Progress Notes (Signed)
Attempted to call spouse, Leah Skora ([phone]) but no answer. Left a message with an update. Will attempt again at a later time. Lajoyce Corners, RN

## 2018-08-31 NOTE — ED Notes (Signed)
ED TO INPATIENT HANDOFF REPORT  ED Nurse Name and Phone #: Ben [phone]  S Name/Age/Gender Jose [name] 85 y.o. male Room/Bed: 016C/016C  Code Status   Code Status: Prior  Home/SNF/Other Nursing Home Patient oriented to: self Is this baseline? No   Triage Complete: Triage complete  Chief Complaint SOB   Triage Note Per [facility], pt from [facility] w/ c/o resp distress/SOB for the past several days. Pt's SpO2 was 85% on 2 lpm via [place] --> 97% via NRB. Lower lung sounds diminished w/ wheezing noted in upper. Pale, cool, and diaphoretic.   150/100 HR 140s RR 50  Tx: 2 nitro 18 ga bilateral wrists     Allergies Allergies  Allergen Reactions  . Axona [Bacid] Other (See Comments)    Unknown per MAR  . Gabapentin Other (See Comments)    Hallucinations    Level of Care/Admitting Diagnosis ED Disposition    ED Disposition Condition Comment   Admit  The patient appears reasonably stabilized for admission considering the current resources, flow, and capabilities available in the ED at this time, and I doubt any other [facility] requiring further screening and/or treatment in the ED prior to admission is  present.       B Medical/Surgery History Past Medical History:  Diagnosis Date  . Abnormality of gait   . Backache, unspecified   . CHF (congestive heart failure) ([place])   . Colon polyps    adenomatous  . Dementia ([place]) 11/12/2012  . Depression   . Diabetes mellitus without complication ([place])   . Diverticulosis   . Essential and other specified forms of tremor   . Hemorrhoids   . History of bilateral hip replacements 11/12/2012  . Memory loss   . 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 Pain in joint, pelvic region and thigh   . Skin cancer of scalp   . Spinal stenosis, lumbar region, without neurogenic claudication   . Unspecified hereditary and idiopathic peripheral neuropathy    Past Surgical History:  Procedure Laterality  Date  . CATARACT EXTRACTION, BILATERAL  2012  . JOINT REPLACEMENT    . RETINAL LASER PROCEDURE  2012   retinal wrinkle  . SKIN CANCER EXCISION    . TOTAL HIP ARTHROPLASTY Left 2000  . TOTAL HIP ARTHROPLASTY (aka REPLACEMENT) Right 2011     A IV Location/Drains/Wounds Patient Lines/Drains/Airways Status   Active Line/Drains/Airways    Name:   Placement date:   Placement time:   Site:   Days:   Peripheral IV 08/30/18 Right Wrist   08/30/18    2331    Wrist   1   Peripheral IV 08/30/18 Left Wrist   08/30/18    2331    Wrist   1          Intake/Output Last 24 hours No intake or output data in the 24 hours ending 08/31/18 0147  Labs/Imaging Results for orders placed or performed during the hospital encounter of 08/30/18 (from the past 48 hour(s))  CBC with Differential/Platelet     Status: Abnormal   Collection Time: 08/30/18 11:28 PM  Result Value Ref Range   WBC 16.3 (H) 4.0 - 10.5 K/uL   RBC 4.73 4.22 - 5.81 MIL/uL   Hemoglobin 14.3 13.0 - 17.0 g/dL   HCT 44.6 39.0 - 52.0 %   MCV 94.3 80.0 - 100.0 fL   MCH 30.2 26.0 - 34.0 pg   MCHC 32.1 30.0 - 36.0  g/dL   RDW 13.8 11.5 - 15.5 %   Platelets 170 150 - 400 K/uL   nRBC 0.0 0.0 - 0.2 %   Neutrophils Relative % 85 %   Neutro Abs 13.7 (H) 1.7 - 7.7 K/uL   Lymphocytes Relative 9 %   Lymphs Abs 1.4 0.7 - 4.0 K/uL   Monocytes Relative 5 %   Monocytes Absolute 0.8 0.1 - 1.0 K/uL   Eosinophils Relative 1 %   Eosinophils Absolute 0.2 0.0 - 0.5 K/uL   Basophils Relative 0 %   Basophils Absolute 0.0 0.0 - 0.1 K/uL   Immature Granulocytes 0 %   Abs Immature Granulocytes 0.07 0.00 - 0.07 K/uL    Comment: Performed at [facility] [address] [place], [place] [address]  Basic metabolic panel     Status: Abnormal   Collection Time: 08/30/18 11:28 PM  Result Value Ref Range   Sodium 134 (L) 135 - 145 mmol/L   Potassium 4.9 3.5 - 5.1 mmol/L   Chloride 100 98 - 111 mmol/L   CO2 20 (L) 22 - 32 mmol/L   Glucose, Bld 398  (H) 70 - 99 mg/dL   BUN 23 8 - 23 mg/dL   Creatinine, Ser 1.00 0.61 - 1.24 mg/dL   Calcium 8.9 8.9 - 10.3 mg/dL   GFR calc non Af Amer >60 >60 mL/min   GFR calc Af Amer >60 >60 mL/min   Anion gap 14 5 - 15    Comment: Performed at [facility] [address] [place], [place] [address]  Troponin I - ONCE - STAT     Status: Abnormal   Collection Time: 08/30/18 11:28 PM  Result Value Ref Range   Troponin I 0.03 (HH) <0.03 ng/mL    Comment: CRITICAL RESULT CALLED TO, READ BACK BY AND VERIFIED WITH: Waneta Fitting,B RN 08/31/2018 0023 JORDANS Performed at [facility], [place] [address], [place], [place] [address]   Brain natriuretic peptide     Status: Abnormal   Collection Time: 08/30/18 11:28 PM  Result Value Ref Range   B Natriuretic Peptide 1,203.8 (H) 0.0 - 100.0 pg/mL    Comment: Performed at [facility] [address] [place], [place] [address]  SARS Coronavirus 2 (CEPHEID- Performed in [facility] hospital lab), Hosp Order     Status: None   Collection Time: 08/30/18 11:44 PM  Result Value Ref Range   SARS Coronavirus 2 NEGATIVE NEGATIVE    Comment: (NOTE) If result is NEGATIVE SARS-CoV-2 target nucleic acids are NOT DETECTED. The SARS-CoV-2 RNA is generally detectable in upper and lower  respiratory specimens during the acute phase of infection. The lowest  concentration of SARS-CoV-2 viral copies this assay can detect is 250  copies / mL. A negative result does not preclude SARS-CoV-2 infection  and should not be used as the sole basis for treatment or other  patient management decisions.  A negative result may occur with  improper specimen collection / handling, submission of specimen other  than nasopharyngeal swab, presence of viral mutation(s) within the  areas targeted by this assay, and inadequate number of viral copies  (<250 copies / mL). A negative result must be combined with clinical  observations, patient history, and epidemiological  information. If result is POSITIVE SARS-CoV-2 target nucleic acids are DETECTED. The SARS-CoV-2 RNA is generally detectable in upper and lower  respiratory specimens dur ing the acute phase of infection.  Positive  results are indicative  of active infection with SARS-CoV-2.  Clinical  correlation with patient history and other diagnostic information is  necessary to determine patient infection status.  Positive results do  not rule out bacterial infection or co-infection with other viruses. If result is PRESUMPTIVE POSTIVE SARS-CoV-2 nucleic acids MAY BE PRESENT.   A presumptive positive result was obtained on the submitted specimen  and confirmed on repeat testing.  While 2019 novel coronavirus  (SARS-CoV-2) nucleic acids may be present in the submitted sample  additional confirmatory testing may be necessary for epidemiological  and / or clinical management purposes  to differentiate between  SARS-CoV-2 and other Sarbecovirus currently known to infect humans.  If clinically indicated additional testing with an alternate test  methodology [phone]) is advised. The SARS-CoV-2 RNA is generally  detectable in upper and lower respiratory sp ecimens during the acute  phase of infection. The expected result is Negative. Fact Sheet for Patients:  StrictlyIdeas.no Fact Sheet for Healthcare Providers: BankingDealers.co.za This test is not yet approved or cleared by the [place] FDA and has been authorized for detection and/or diagnosis of SARS-CoV-2 by FDA under an Emergency Use Authorization (EUA).  This EUA will remain in effect (meaning this test can be used) for the duration of the COVID-19 declaration under Section 564(b)(1) of the Act, 21 U.S.C. section 360bbb-3(b)(1), unless the authorization is terminated or revoked sooner. Performed at [facility], [place] [address], [place], [place] [address]   Blood culture (routine x 2)      Status: None (Preliminary result)   Collection Time: 08/31/18  1:10 AM  Result Value Ref Range   Specimen Description BLOOD RIGHT ARM    Special Requests      BOTTLES DRAWN AEROBIC AND ANAEROBIC Blood Culture results may not be optimal due to an excessive volume of blood received in culture bottles Performed at [facility] [address], [place],  [address]    Culture PENDING    Report Status PENDING    Dg Chest Portable 1 View  Result Date: 08/31/2018 CLINICAL DATA:  Shortness of breath EXAM: PORTABLE CHEST 1 VIEW COMPARISON:  05/21/2018 FINDINGS: Low lung volumes. Probable small effusions. Bibasilar airspace disease. Stable cardiomediastinal silhouette. No pneumothorax. IMPRESSION: Low lung volumes with suspected small effusions and bibasilar airspace disease, atelectasis versus pneumonia. Electronically Signed   By: Donavan Foil M.D.   On: 08/31/2018 00:35    Pending Labs Unresulted Labs (From admission, onward)    Start     Ordered   08/31/18 0500  CBC  Tomorrow morning,   R     08/31/18 0142   08/31/18 4008  Basic metabolic panel  Tomorrow morning,   R     08/31/18 0142   08/31/18 0142  MRSA PCR Screening  Once,   R     08/31/18 0141   08/31/18 0142  Respiratory Panel by PCR  (Respiratory virus panel with precautions)  Once,   R     08/31/18 0141   08/31/18 0141  Procalcitonin - Baseline  ONCE - STAT,   STAT     08/31/18 0140   08/31/18 0140  Lactic acid, plasma  STAT Now then every 3 hours,   R     08/31/18 0139   08/31/18 0058  Blood culture (routine x 2)  BLOOD CULTURE X 2,   STAT     08/31/18 0057          Vitals/Pain Today's Vitals   08/31/18 0030 08/31/18  0045 08/31/18 0100 08/31/18 0115  BP: (!) 124/112 103/88 90/66 112/77  Pulse: (!) 109 (!) 108 95 (!) 108  Resp: (!) 35 (!) 28 (!) 33 (!) 37  Temp:      TempSrc:      SpO2: 99% 99% 99% 99%  Weight:      Height:      PainSc:        Isolation Precautions Droplet  precaution  Medications Medications  vancomycin (VANCOCIN) 1,500 mg in sodium chloride 0.9 % 500 mL IVPB (1,500 mg Intravenous New Bag/Given 08/31/18 0129)  vancomycin (VANCOCIN) 1,500 mg in sodium chloride 0.9 % 500 mL IVPB (has no administration in time range)  insulin aspart (novoLOG) injection 0-9 Units (has no administration in time range)  furosemide (LASIX) injection 40 mg (40 mg Intravenous Given 08/30/18 2346)  nitroGLYCERIN (NITROGLYN) 2 % ointment 0.5 inch (0.5 inches Topical Given 08/30/18 2346)  ceFEPIme (MAXIPIME) 2 g in sodium chloride 0.9 % 100 mL IVPB (2 g Intravenous New Bag/Given 08/31/18 0115)    Mobility non-ambulatory     Focused Assessments Code Sepsis   R Recommendations: See Admitting Provider Note  Report given to:   Additional Notes: N/A

## 2018-08-31 NOTE — Progress Notes (Addendum)
CRITICAL VALUE ALERT  Critical Value:  Lactic acid 3.6  Date & Time Notied:  08/31/18 @ 0605  Provider Notified: text paged [facility] provider  Orders Received/Actions taken: no new orders given  Will continue to monitor.  Lajoyce Corners, RN

## 2018-08-31 NOTE — Progress Notes (Addendum)
Pharmacy Antibiotic Note  Jose [name] is a 85 y.o. male admitted on 08/30/2018 with pneumonia.  Pharmacy has been consulted for vancomycin and unsyn dosing.  Plan: Vancomycin 1500 mg IV q24 hours Unasyn 3gm IV q8 hours F/u renal function, cultures and clinical course  Height: 6\' 1"  (185.4 cm) Weight: 173 lb 8 oz (78.7 kg) IBW/kg (Calculated) : 79.9  Temp (24hrs), Avg:100.5 F (38.1 C), Min:100.5 F (38.1 C), Max:100.5 F (38.1 C)  Recent Labs  Lab 08/30/18 2328  WBC 16.3*  CREATININE 1.00    Estimated Creatinine Clearance: 59 mL/min (by C-G formula based on SCr of 1 mg/dL).    Allergies  Allergen Reactions  . Axona [Bacid] Other (See Comments)    Unknown per MAR  . Gabapentin Other (See Comments)    Hallucinations    Thank you for allowing pharmacy to be a part of this patient's care.  Excell Seltzer Poteet 08/31/2018 1:05 AM

## 2018-08-31 NOTE — ED Notes (Signed)
Please contact wife Opal Sidles at [phone] (house line)  [phone] (cell phone) [phone] [facility], Daughter)

## 2018-08-31 NOTE — Evaluation (Signed)
Clinical/Bedside Swallow Evaluation Patient Details  Name: Jose [name] MRN: [id number] Date of Birth: 1931/01/15  Today's Date: 08/31/2018 Time: SLP Start Time (ACUTE ONLY): 6269 SLP Stop Time (ACUTE ONLY): 0850 SLP Time Calculation (min) (ACUTE ONLY): 24 min  Past Medical History:  Past Medical History:  Diagnosis Date  . Abnormality of gait   . Backache, unspecified   . CHF (congestive heart failure) ([place])   . Colon polyps    adenomatous  . Dementia ([place]) 11/12/2012  . Depression   . Diabetes mellitus without complication ([place])   . Diverticulosis   . Essential and other specified forms of tremor   . Hemorrhoids   . History of bilateral hip replacements 11/12/2012  . Memory loss   . 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 Pain in joint, pelvic region and thigh   . Skin cancer of scalp   . Spinal stenosis, lumbar region, without neurogenic claudication   . Unspecified hereditary and idiopathic peripheral neuropathy    Past Surgical History:  Past Surgical History:  Procedure Laterality Date  . CATARACT EXTRACTION, BILATERAL  2012  . JOINT REPLACEMENT    . RETINAL LASER PROCEDURE  2012   retinal wrinkle  . SKIN CANCER EXCISION    . TOTAL HIP ARTHROPLASTY Left 2000  . TOTAL HIP ARTHROPLASTY (aka REPLACEMENT) Right 2011   HPI:  Pt is an 85 yo male admitted with concern for PNA after suspected aspiration event 2 days prior to admit when he was given meds while lying down. Per MD note, daughter says he has otherwise been doing well with dysphagia diet and honey thick liquids when upright. Pt had three admissions earlier this year for recurrent PNA. MBS most recently in January 2020 recommended thin liquids but with silent aspiration of larger boluses. He was downgraded clinically to Dys 1 diet/nectar thick liquids (at family request) in February 2020 due to coughing with intake and increased need for BiPAP. PMH also includes: dementia, DM2, CHF,  HTN.   Assessment / Plan / Recommendation Clinical Impression  Pt's baseline diet is reportedly downgraded since most recent SLP visit within the hospital, and today he demonstrates signs of dysphagia with concern for decreased airway protection with even these consistencies. He has oral holding with purees and mild residue present without awareness even after cued to swallow. At times he coughs while puree is held in his oral cavity, possibly indicative of premature spillage toward the airway. He appears to initiate posterior transit in a more timely manner with honey thick liquids, but delayed coughing is still elicited. Recommend that he remain NPO for today, with reassessment by SLP on next date for readiness to resume POs versus need for instrumental testing if within overall GOC. Would also consider involving palliative care team, who has previously been involved with pt/family given chronic concern for dysphagia with recurrent hospitalizations for PNA.  SLP Visit Diagnosis: Dysphagia, unspecified (R13.10)    Aspiration Risk  Moderate aspiration risk    Diet Recommendation NPO   Medication Administration: Via alternative means    Other  Recommendations Oral Care Recommendations: Oral care QID   Follow up Recommendations (tba)      Frequency and Duration min 2x/week  2 weeks       Prognosis Prognosis for Safe Diet Advancement: Fair Barriers to Reach Goals: Time post onset;Cognitive deficits      Swallow Study   General HPI: Pt is an 85 yo male admitted with concern for  PNA after suspected aspiration event 2 days prior to admit when he was given meds while lying down. Per MD note, daughter says he has otherwise been doing well with dysphagia diet and honey thick liquids when upright. Pt had three admissions earlier this year for recurrent PNA. MBS most recently in January 2020 recommended thin liquids but with silent aspiration of larger boluses. He was downgraded clinically to Dys  1 diet/nectar thick liquids (at family request) in February 2020 due to coughing with intake and increased need for BiPAP. PMH also includes: dementia, DM2, CHF, HTN. Type of Study: Bedside Swallow Evaluation Previous Swallow Assessment: see HPI Diet Prior to this Study: NPO Temperature Spikes Noted: Yes(100.5) Respiratory Status: Nasal cannula History of Recent Intubation: No Behavior/Cognition: Alert;Cooperative Oral Cavity Assessment: Within Functional Limits(mild residuals of anterior spillage of saliva on L side) Oral Care Completed by SLP: No Oral Cavity - Dentition: Adequate natural dentition Vision: Functional for self-feeding Self-Feeding Abilities: Needs assist Patient Positioning: Upright in bed(somewhat naturally in chin tuck position) Baseline Vocal Quality: Normal Volitional Swallow: Able to elicit    Oral/Motor/Sensory Function Overall Oral Motor/Sensory Function: (evidence of drooling on L but without overt weakness)   Ice Chips Ice chips: Within functional limits Presentation: Spoon   Thin Liquid Thin Liquid: Not tested    Nectar Thick Nectar Thick Liquid: Not tested   Honey Thick Honey Thick Liquid: Impaired Presentation: Cup;Self fed;Straw Pharyngeal Phase Impairments: Cough - Delayed   Puree Puree: Impaired Presentation: Self Fed;Spoon Oral Phase Impairments: Poor awareness of bolus Oral Phase Functional Implications: Oral holding;Oral residue Pharyngeal Phase Impairments: Cough - Immediate   Solid     Solid: Not tested      Venita Sheffield Otila Starn 08/31/2018,9:26 AM  Pollyann Glen, M.A. [place] Acute Environmental education officer [phone] Office [phone]

## 2018-08-31 NOTE — Progress Notes (Signed)
Daughter called back:  Confirmed DNR but full medical care.  Also noted apparent aspiration event x2 days ago when he was laying down and was given his meds while spouse not in room.  Apparently he does okay eating on the dysphagia diet with honey thick liquids etc when sitting up at 90 degress, and will aspirate everything if laying down.  This fits pretty well with the history of recurrent aspiration these past 6 months as well as the expected 48-72hr incubation period from aspiration event to development of PNA.

## 2018-08-31 NOTE — ED Provider Notes (Addendum)
[facility] EMERGENCY DEPARTMENT Provider Note   CSN: [id number] Arrival date & time: 08/30/18  2311    History   Chief Complaint Chief Complaint  Patient presents with  . Shortness of Breath    HPI Jose [name] is a 85 y.o. male.     The history is provided by the EMS personnel. The history is limited by the condition of the patient.  Shortness of Breath  Severity:  Severe Onset quality:  Gradual Timing:  Constant Progression:  Worsening Chronicity:  Recurrent Context: not activity   Relieved by:  Nothing Worsened by:  Nothing Ineffective treatments:  None tried Associated symptoms: diaphoresis   Associated symptoms: no chest pain, no fever and no hemoptysis   Risk factors: no hx of PE/DVT     Past Medical History:  Diagnosis Date  . Abnormality of gait   . Backache, unspecified   . CHF (congestive heart failure) ([place])   . Colon polyps    adenomatous  . Dementia ([place]) 11/12/2012  . Depression   . Diabetes mellitus without complication ([place])   . Diverticulosis   . Essential and other specified forms of tremor   . Hemorrhoids   . History of bilateral hip replacements 11/12/2012  . Memory loss   . 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 Pain in joint, pelvic region and thigh   . Skin cancer of scalp   . Spinal stenosis, lumbar region, without neurogenic claudication   . Unspecified hereditary and idiopathic peripheral neuropathy     Patient Active Problem List   Diagnosis Date Noted  . Adult failure to thrive   . Upper GI bleed   . Palliative care encounter   . Goals of care, counseling/discussion   . Eye irritation 05/11/2018  . DNR (do not resuscitate) discussion   . Palliative care by specialist   . Aspiration pneumonia ([place]) 04/26/2018  . Constipation 04/15/2018  . HTN (hypertension) 04/15/2018  . Iron deficiency anemia 04/08/2018  . Actinic keratosis 06/21/2017  . Moderate protein-calorie  malnutrition ([place]) 03/06/2017  . Unsteady gait 03/06/2017  . Urinary incontinence 12/31/2016  . Dysphagia 12/31/2016  . Dermatitis, seborrheic 12/14/2016  . Edema 12/14/2016  . Hyperlipidemia associated with type 2 diabetes mellitus ([place]) 12/06/2016  . Functional urinary incontinence 12/06/2016  . Chronic bilateral low back pain with bilateral sciatica 12/06/2016  . Chronic combined systolic and diastolic congestive heart failure ([place]) 12/06/2016  . Major depression, recurrent, chronic ([place]) 11/23/2016  . OAB (overactive bladder) 11/23/2016  . Dermatitis 08/23/2016  . External hemorrhoid 07/26/2016  . Depression with anxiety 07/09/2016  . Thrombocytopenia ([place]) 06/28/2016  . Closed left hip fracture, sequela 06/22/2016  . Type 2 diabetes mellitus with diabetic chronic kidney disease ([place]) 06/22/2016  . Hyperlipidemia LDL goal <70 06/22/2016  . Alzheimer disease ([place]) 11/12/2012  . History of bilateral hip replacements 11/12/2012    Past Surgical History:  Procedure Laterality Date  . CATARACT EXTRACTION, BILATERAL  2012  . JOINT REPLACEMENT    . RETINAL LASER PROCEDURE  2012   retinal wrinkle  . SKIN CANCER EXCISION    . TOTAL HIP ARTHROPLASTY Left 2000  . TOTAL HIP ARTHROPLASTY (aka REPLACEMENT) Right 2011        Home Medications    Prior to Admission medications   Medication Sig Start Date End Date Taking? Authorizing Provider  albuterol (PROVENTIL) (2.5 MG/3ML) 0.083% nebulizer solution Take 3 mLs (2.5 mg total) by nebulization every 4 (  four) hours as needed for wheezing or shortness of breath. 05/23/18   Debbe Odea, MD  buPROPion (WELLBUTRIN SR) 100 MG 12 hr tablet Take 100 mg by mouth daily. 11/23/16   [provider]  carvedilol (COREG) 3.125 MG tablet Take 1 tablet (3.125 mg total) by mouth 2 (two) times daily with a meal. 11/30/16   Theodis Blaze, MD  Cholecalciferol (VITAMIN D) 1000 UNITS capsule Take 1,000 Units by mouth daily.      [provider]  docusate sodium (COLACE) 100 MG capsule Take 100 mg by mouth daily.    [provider]  donepezil (ARICEPT) 10 MG tablet Take 10 mg by mouth daily.    [provider]  donepezil (ARICEPT) 23 MG TABS tablet Take 1 tablet (23 mg total) by mouth daily. 10/14/17   Ward Givens, NP  dorzolamide-timolol (COSOPT) 22.3-6.8 MG/ML ophthalmic solution Place 1 drop into the left eye 2 (two) times daily.  09/20/12   [provider]  ferrous sulfate 325 (65 FE) MG tablet Take 325 mg by mouth daily with breakfast.    [provider]  Flaxseed, Linseed, (FLAXSEED OIL) 1000 MG CAPS Take 1,000 mg by mouth daily.      [provider]  folic acid (FOLVITE) 378 MCG tablet Take 800 mcg by mouth every evening.     [provider]  furosemide (LASIX) 20 MG tablet Take 1 tablet (20 mg total) by mouth daily. Patient taking differently: Take 10 mg by mouth daily.  05/05/18   Roney Jaffe, MD  memantine (NAMENDA XR) 28 MG CP24 24 hr capsule Take 1 capsule (28 mg total) by mouth daily. 10/14/17   Ward Givens, NP  metFORMIN (GLUCOPHAGE) 500 MG tablet Take 250 mg by mouth 2 (two) times daily with a meal.     [provider]  Multiple Vitamin (MULTIVITAMIN) tablet Take 1 tablet by mouth daily.      [provider]  nystatin cream (MYCOSTATIN) Apply 1 application topically 2 (two) times daily. Apply to affected areas on red gluteal areas twice daily for 14 days    [provider]  OVER THE COUNTER MEDICATION Apply 41 % topically as needed. Aquaphor Healing(white petrolatum) ointment apply to dry or red skin    [provider]  pantoprazole (PROTONIX) 40 MG tablet Take 1 tablet (40 mg total) by mouth daily. 05/23/18   Debbe Odea, MD  Probiotic Product (ALIGN) 4 MG CAPS Take 4 mg by mouth daily.    [provider]  simvastatin (ZOCOR) 5 MG tablet Take 5 mg by mouth daily.    [provider]  zinc oxide  20 % ointment Apply 1 application topically as needed for irritation. To buttocks after every incontinent episode and as needed for redness    [provider]    Family History Family History  Problem Relation Age of Onset  . Heart failure Mother     Social History Social History   Tobacco Use  . Smoking status: Former Smoker    Types: Cigarettes  . Smokeless tobacco: Never Used  Substance Use Topics  . Alcohol use: Yes    Alcohol/week: 14.0 standard drinks    Types: 14 Glasses of wine per week    Comment: occas, one glass of wine before dinner,sometimes 1/2 glass  . Drug use: No     Allergies   Axona [bacid] and Gabapentin   Review of Systems Review of Systems  Unable to perform ROS: Acuity  of condition  Constitutional: Positive for diaphoresis. Negative for fever.  Respiratory: Positive for shortness of breath. Negative for hemoptysis.   Cardiovascular: Negative for chest pain.     Physical Exam Updated Vital Signs BP (!) 148/104   Pulse (!) 49   Temp (!) 100.5 F (38.1 C) (Rectal)   Resp (!) 40   Ht 6\' 1"  (1.854 m)   Wt 78.7 kg   SpO2 99%   BMI 22.89 kg/m   Physical Exam Vitals signs and nursing note reviewed.  Constitutional:      General: He is in acute distress.     Appearance: He is normal weight. He is diaphoretic.  HENT:     Head: Normocephalic and atraumatic.     Nose: Nose normal.  Eyes:     Conjunctiva/sclera: Conjunctivae normal.     Pupils: Pupils are equal, round, and reactive to light.  Neck:     Musculoskeletal: Normal range of motion and neck supple.  Cardiovascular:     Rate and Rhythm: Normal rate and regular rhythm.     Pulses: Normal pulses.     Heart sounds: Normal heart sounds.  Pulmonary:     Breath sounds: Rales present.  Abdominal:     General: Abdomen is flat. Bowel sounds are normal.     Tenderness: There is no abdominal tenderness. There is no guarding.  Musculoskeletal:        General: No deformity.   Skin:    General: Skin is warm.     Capillary Refill: Capillary refill takes less than 2 seconds.  Neurological:     Deep Tendon Reflexes: Reflexes normal.      ED Treatments / Results  Labs (all labs ordered are listed, but only abnormal results are displayed) Results for orders placed or performed during the hospital encounter of 08/30/18  SARS Coronavirus 2 (CEPHEID- Performed in [place] hospital lab), [facility] Order  Result Value Ref Range   SARS Coronavirus 2 NEGATIVE NEGATIVE  CBC with Differential/Platelet  Result Value Ref Range   WBC 16.3 (H) 4.0 - 10.5 K/uL   RBC 4.73 4.22 - 5.81 MIL/uL   Hemoglobin 14.3 13.0 - 17.0 g/dL   HCT 44.6 39.0 - 52.0 %   MCV 94.3 80.0 - 100.0 fL   MCH 30.2 26.0 - 34.0 pg   MCHC 32.1 30.0 - 36.0 g/dL   RDW 13.8 11.5 - 15.5 %   Platelets 170 150 - 400 K/uL   nRBC 0.0 0.0 - 0.2 %   Neutrophils Relative % 85 %   Neutro Abs 13.7 (H) 1.7 - 7.7 K/uL   Lymphocytes Relative 9 %   Lymphs Abs 1.4 0.7 - 4.0 K/uL   Monocytes Relative 5 %   Monocytes Absolute 0.8 0.1 - 1.0 K/uL   Eosinophils Relative 1 %   Eosinophils Absolute 0.2 0.0 - 0.5 K/uL   Basophils Relative 0 %   Basophils Absolute 0.0 0.0 - 0.1 K/uL   Immature Granulocytes 0 %   Abs Immature Granulocytes 0.07 0.00 - 0.07 K/uL  Basic metabolic panel  Result Value Ref Range   Sodium 134 (L) 135 - 145 mmol/L   Potassium 4.9 3.5 - 5.1 mmol/L   Chloride 100 98 - 111 mmol/L   CO2 20 (L) 22 - 32 mmol/L   Glucose, Bld 398 (H) 70 - 99 mg/dL   BUN 23 8 - 23 mg/dL   Creatinine, Ser 1.00 0.61 - 1.24 mg/dL   Calcium 8.9 8.9 - 10.3  mg/dL   GFR calc non Af Amer >60 >60 mL/min   GFR calc Af Amer >60 >60 mL/min   Anion gap 14 5 - 15  Troponin I - ONCE - STAT  Result Value Ref Range   Troponin I 0.03 (HH) <0.03 ng/mL  Brain natriuretic peptide  Result Value Ref Range   B Natriuretic Peptide 1,203.8 (H) 0.0 - 100.0 pg/mL   Dg Chest Portable 1 View  Result Date: 08/31/2018 CLINICAL DATA:   Shortness of breath EXAM: PORTABLE CHEST 1 VIEW COMPARISON:  05/21/2018 FINDINGS: Low lung volumes. Probable small effusions. Bibasilar airspace disease. Stable cardiomediastinal silhouette. No pneumothorax. IMPRESSION: Low lung volumes with suspected small effusions and bibasilar airspace disease, atelectasis versus pneumonia. Electronically Signed   By: Donavan Foil M.D.   On: 08/31/2018 00:35    EKG EKG Interpretation  Date/Time:  Saturday Aug 30 2018 23:18:26 EDT Ventricular Rate:  121 PR Interval:    QRS Duration: 87 QT Interval:  322 QTC Calculation: 457 R Axis:   -26 Text Interpretation:  Sinus tachycardia Abnormal R-wave progression, early transition Left ventricular hypertrophy Confirmed by Dory Horn) on 08/31/2018 12:32:34 AM   Radiology Dg Chest Portable 1 View  Result Date: 08/31/2018 CLINICAL DATA:  Shortness of breath EXAM: PORTABLE CHEST 1 VIEW COMPARISON:  05/21/2018 FINDINGS: Low lung volumes. Probable small effusions. Bibasilar airspace disease. Stable cardiomediastinal silhouette. No pneumothorax. IMPRESSION: Low lung volumes with suspected small effusions and bibasilar airspace disease, atelectasis versus pneumonia. Electronically Signed   By: Donavan Foil M.D.   On: 08/31/2018 00:35    Procedures Procedures (including critical care time)  Medications Ordered in ED Medications  ceFEPIme (MAXIPIME) 1 g in sodium chloride 0.9 % 100 mL IVPB (has no administration in time range)  furosemide (LASIX) injection 40 mg (40 mg Intravenous Given 08/30/18 2346)  nitroGLYCERIN (NITROGLYN) 2 % ointment 0.5 inch (0.5 inches Topical Given 08/30/18 2346)      Final Clinical Impressions(s) / ED Diagnoses   Final diagnoses:  Acute pulmonary edema ([place])    Admit to medicine.  I believe this is more likely pulmonary edema given sudden onset and elevated BP and diaphoresis.  Have covered with HCAP abx.  I do not believe this patient is septic    Argentina Kosch, MD  08/31/18 [place], Champ Keetch, MD 08/31/18 1610

## 2018-08-31 NOTE — H&P (Signed)
History and Physical    Jose [name] RCV:[id number] DOB: Feb 22, 1931 DOA: 08/30/2018  PCP: [name], MD  Patient coming from: SNF  I have personally briefly reviewed patient's old medical records in [place]  Chief Complaint: Respiratory distress  HPI: Jose [name] is a 85 y.o. male with medical history significant of advanced dementia, DM2, CHF, HTN.  Patient with suspected aspiration syndrome, with 3 admissions earlier this year for recurrent PNAs.  As far as I am aware and can tell based on SNF provider notes as well as discharge summary from last admission, the patient is now DNR/DNI, but still do everything medically short of intubation to treat.  (Im unable to get a-hold of family members at this time despite calling all numbers in the chart).  Patient brought in to ED with respiratory distress.  From histroy in chart it seems he has been SOB for past couple of days.  O2 sat was 85% on 2L.   ED Course: Placed on NRB, now satting 100%.  Remains tachypnic.  BNP 1200, Tm 100.5, WBC 16k.  EDP started cefepime and vanc.  EDP concerned about possible CHF as well so gave 40mg  lasix and NTG paste.  Initial SBP 160s, dropped to 90s so NTG paste removed.  Initial RR in upper 40s to 50, now down to 30s.   Review of Systems: Unable to perform due to demtnia.  Past Medical History:  Diagnosis Date  . Abnormality of gait   . Backache, unspecified   . CHF (congestive heart failure) ([place])   . Colon polyps    adenomatous  . Dementia (Jose [name]) 11/12/2012  . Depression   . Diabetes mellitus without complication ([place])   . Diverticulosis   . Essential and other specified forms of tremor   . Hemorrhoids   . History of bilateral hip replacements 11/12/2012  . Memory loss   . 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 Pain in joint, pelvic region and thigh   . Skin cancer of scalp   . Spinal stenosis, lumbar region, without neurogenic claudication   .  Unspecified hereditary and idiopathic peripheral neuropathy     Past Surgical History:  Procedure Laterality Date  . CATARACT EXTRACTION, BILATERAL  2012  . JOINT REPLACEMENT    . RETINAL LASER PROCEDURE  2012   retinal wrinkle  . SKIN CANCER EXCISION    . TOTAL HIP ARTHROPLASTY Left 2000  . TOTAL HIP ARTHROPLASTY (aka REPLACEMENT) Right 2011     reports that he has quit smoking. His smoking use included cigarettes. He has never used smokeless tobacco. He reports current alcohol use of about 14.0 standard drinks of alcohol per week. He reports that he does not use drugs.  Allergies  Allergen Reactions  . Axona [Bacid] Other (See Comments)    Unknown per MAR  . Gabapentin Other (See Comments)    Hallucinations    Family History  Problem Relation Age of Onset  . Heart failure Mother      Prior to Admission medications   Medication Sig Start Date End Date Taking? Authorizing Provider  albuterol (PROVENTIL) (2.5 MG/3ML) 0.083% nebulizer solution Take 3 mLs (2.5 mg total) by nebulization every 4 (four) hours as needed for wheezing or shortness of breath. 05/23/18  Yes Rizwan, Eunice Blase, MD  buPROPion (WELLBUTRIN SR) 100 MG 12 hr tablet Take 100 mg by mouth daily. 11/23/16  Yes [provider]  carvedilol (COREG) 3.125 MG tablet Take  1 tablet (3.125 mg total) by mouth 2 (two) times daily with a meal. 11/30/16  Yes Theodis Blaze, MD  Cholecalciferol (VITAMIN D) 1000 UNITS capsule Take 1,000 Units by mouth daily.     Yes [provider]  docusate sodium (COLACE) 100 MG capsule Take 100 mg by mouth daily.   Yes [provider]  donepezil (ARICEPT) 10 MG tablet Take 10 mg by mouth daily.   Yes [provider]  dorzolamide-timolol (COSOPT) 22.3-6.8 MG/ML ophthalmic solution Place 1 drop into the left eye 2 (two) times daily.  09/20/12  Yes [provider]  ferrous sulfate 325 (65 FE) MG tablet Take 325 mg by mouth daily with breakfast.   Yes [provider]  Flaxseed, Linseed, (FLAXSEED OIL) 1000 MG CAPS Take 1,000 mg by mouth daily.     Yes [provider]  folic acid (FOLVITE) 956 MCG tablet Take 800 mcg by mouth every evening.    Yes [provider]  furosemide (LASIX) 20 MG tablet Take 1 tablet (20 mg total) by mouth daily. Patient taking differently: Take 10 mg by mouth daily.  05/05/18  Yes Roney Jaffe, MD  memantine (NAMENDA) 10 MG tablet Take 10 mg by mouth 2 (two) times daily.   Yes [provider]  metFORMIN (GLUCOPHAGE) 500 MG tablet Take 250 mg by mouth 2 (two) times daily with a meal.    Yes [provider]  Multiple Vitamin (MULTIVITAMIN) tablet Take 1 tablet by mouth daily.     Yes [provider]  OVER THE COUNTER MEDICATION Apply 41 % topically as needed. Aquaphor Healing(white petrolatum) ointment apply to dry or red skin   Yes [provider]  pantoprazole (PROTONIX) 40 MG tablet Take 1 tablet (40 mg total) by mouth daily. 05/23/18  Yes Debbe Odea, MD  Probiotic Product (ALIGN) 4 MG CAPS Take 4 mg by mouth daily.   Yes [provider]  simvastatin (ZOCOR) 5 MG tablet Take 5 mg by mouth at bedtime.    Yes [provider]  zinc oxide 20 % ointment Apply 1 application topically as needed for irritation. To buttocks after every incontinent episode and as needed for redness   Yes [provider]  donepezil (ARICEPT) 23 MG TABS tablet Take 1 tablet (23 mg total) by mouth daily. Patient not taking: Reported on 08/31/2018 10/14/17   Ward Givens, NP  memantine (NAMENDA XR) 28 MG CP24 24 hr capsule Take 1 capsule (28 mg total) by mouth daily. Patient not taking: Reported on 08/31/2018 10/14/17   Ward Givens, NP    Physical Exam: Vitals:   08/31/18 0100 08/31/18 0115 08/31/18 0130 08/31/18 0145  BP: 90/66 112/77 94/84 90/71   Pulse: 95 (!) 108 (!) 112 (!) 50  Resp: (!) 33 (!) 37 (!) 27 (!) 26  Temp:      TempSrc:      SpO2: 99% 99%  98% 100%  Weight:      Height:        Constitutional: ill appearing Eyes: PERRL, lids and conjunctivae normal ENMT: Mucous membranes are moist. Posterior pharynx clear of any exudate or lesions.Normal dentition.  Neck: normal, supple, no masses, no thyromegaly Respiratory: Tachypnea present with rales and wet cough Cardiovascular: Tachycardia Abdomen: no tenderness, no masses palpated. No hepatosplenomegaly. Bowel sounds positive.  Musculoskeletal: no clubbing / cyanosis. No joint deformity upper and lower extremities. Good ROM, no contractures. Normal muscle tone.  Skin: no rashes, lesions, ulcers. No induration Neurologic: MAE,  makes eye contact, responds to name Psychiatric: Responds to name, makes eye contact   Labs on Admission: I have personally reviewed following labs and imaging studies  CBC: Recent Labs  Lab 08/30/18 2328  WBC 16.3*  NEUTROABS 13.7*  HGB 14.3  HCT 44.6  MCV 94.3  PLT 458   Basic Metabolic Panel: Recent Labs  Lab 08/30/18 2328  NA 134*  K 4.9  CL 100  CO2 20*  GLUCOSE 398*  BUN 23  CREATININE 1.00  CALCIUM 8.9   GFR: Estimated Creatinine Clearance: 59 mL/min (by C-G formula based on SCr of 1 mg/dL). Liver Function Tests: No results for input(s): AST, ALT, ALKPHOS, BILITOT, PROT, ALBUMIN in the last 168 hours. No results for input(s): LIPASE, AMYLASE in the last 168 hours. No results for input(s): AMMONIA in the last 168 hours. Coagulation Profile: No results for input(s): INR, PROTIME in the last 168 hours. Cardiac Enzymes: Recent Labs  Lab 08/30/18 2328  TROPONINI 0.03*   BNP (last 3 results) No results for input(s): PROBNP in the last 8760 hours. HbA1C: No results for input(s): HGBA1C in the last 72 hours. CBG: No results for input(s): GLUCAP in the last 168 hours. Lipid Profile: No results for input(s): CHOL, HDL, LDLCALC, TRIG, CHOLHDL, LDLDIRECT in the last 72 hours. Thyroid Function Tests: No results for input(s): TSH,  T4TOTAL, FREET4, T3FREE, THYROIDAB in the last 72 hours. Anemia Panel: No results for input(s): VITAMINB12, FOLATE, FERRITIN, TIBC, IRON, RETICCTPCT in the last 72 hours. Urine analysis:    Component Value Date/Time   COLORURINE YELLOW 05/01/2018 1109   APPEARANCEUR CLEAR 05/01/2018 1109   LABSPEC 1.011 05/01/2018 1109   PHURINE 6.0 05/01/2018 1109   GLUCOSEU NEGATIVE 05/01/2018 1109   HGBUR LARGE (A) 05/01/2018 1109   BILIRUBINUR NEGATIVE 05/01/2018 1109   KETONESUR NEGATIVE 05/01/2018 1109   PROTEINUR NEGATIVE 05/01/2018 1109   UROBILINOGEN 0.2 06/24/2014 2112   NITRITE NEGATIVE 05/01/2018 1109   LEUKOCYTESUR NEGATIVE 05/01/2018 1109    Radiological Exams on Admission: Dg Chest Portable 1 View  Result Date: 08/31/2018 CLINICAL DATA:  Shortness of breath EXAM: PORTABLE CHEST 1 VIEW COMPARISON:  05/21/2018 FINDINGS: Low lung volumes. Probable small effusions. Bibasilar airspace disease. Stable cardiomediastinal silhouette. No pneumothorax. IMPRESSION: Low lung volumes with suspected small effusions and bibasilar airspace disease, atelectasis versus pneumonia. Electronically Signed   By: Donavan Foil M.D.   On: 08/31/2018 00:35    EKG: Independently reviewed.  Assessment/Plan Principal Problem:   Aspiration pneumonia (HCC) Active Problems:   Alzheimer disease ([place])   Type 2 diabetes mellitus with diabetic chronic kidney disease (HCC)   Chronic combined systolic and diastolic congestive heart failure (HCC)   HTN (hypertension)   Acute respiratory failure with hypoxia ([place])    1. Acute respiratory failure with hypoxia - 1. Suspected recurrent Aspiration PNA given his extensive history, fever, WBC 2. Could also be component of acute CHF given BNP elevation and initial HTN, not clear however 3. Unasyn per pharm 4. Will leave the vanc alone for now, DC this if MRSA PCR in nares is negative 5. PNA pathway 6. Cultures pending 7. COVID negative 8. RVP pending 9. Check lactate,  check procalcitonin 10. NTG paste DCd 11. Strict intake and output 12. Will hold off on ordering any IVF or further lasix for the moment given that its not clear if theres concurrent acute CHF.  Will need to see how his BP, HR, UOP, and lactate do to help guide this. 13. NPO until  SLP can evaluate him in AM 2. CHF - see above 3. HTN - hold home BP meds 4. DM2 - 1. Hold metformin 2. Sensitive SSI Q4H  DVT prophylaxis: SCDs for the moment - had UGIB last time I admitted him in Feb and tried lovenox PPX, patient too high risk for EGD so no further investigation was pursued at that time. Code Status: DNR - but still full medical care - this based on most recent SNF provider notes Family Communication: Tried spouse, daughter, and son's numbers listed in chart including home and mobile numbers where available, wasn't able to get a-hold of anyone. Disposition Plan: SNF after admit Consults called: None Admission status: Admit to inpatient  Severity of Illness: The appropriate patient status for this patient is INPATIENT. Inpatient status is judged to be reasonable and necessary in order to provide the required intensity of service to ensure the patient's safety. The patient's presenting symptoms, physical exam findings, and initial radiographic and laboratory data in the context of their chronic comorbidities is felt to place them at high risk for further clinical deterioration. Furthermore, it is not anticipated that the patient will be medically stable for discharge from the hospital within 2 midnights of admission. The following factors support the patient status of inpatient.   " The patient's presenting symptoms include respiratory distress. " The worrisome physical exam findings include respiratory distress, increased O2 requirement, rales. " The initial radiographic and laboratory data are worrisome because of B basilar infiltrates. " The chronic co-morbidities include advanced dementia,  recurrent aspiration PNAs, CHF.   * I certify that at the point of admission it is my clinical judgment that the patient will require inpatient hospital care spanning beyond 2 midnights from the point of admission due to high intensity of service, high risk for further deterioration and high frequency of surveillance required.*    GARDNER, JARED M. DO [facility]  How to contact the [facility] Attending or Consulting provider [place] or covering provider during after hours [place], for this patient?  1. Check the care team in [facility] and look for a) attending/consulting [facility] provider listed and b) the [facility] team listed 2. Log into [email or web address]  Amion Physician Scheduling and messaging for groups and whole hospitals  On call and physician scheduling software for group practices, residents, hospitalists and other medical providers for call, clinic, rotation and shift schedules. On[employer] is a hospital-wide system for scheduling doctors and paging doctors on call. EasyPlot is for scientific plotting and data analysis.  [email or web address]  and use [place]'s universal password to access. If you do not have the password, please contact the hospital operator.  3. Locate the [facility] provider you are looking for under [facility] and page to a number that you can be directly reached. 4. If you still have difficulty reaching the provider, please page the [facility] (Director on Call) for the Hospitalists listed on amion for assistance.  08/31/2018, 2:02 AM

## 2018-08-31 NOTE — Progress Notes (Signed)
PROGRESS NOTE    Jose [name]  MAU:[id number] DOB: 1931-02-18 DOA: 08/30/2018 PCP: [name], MD   Brief Narrative:  HPI on 08/31/2018 by Dr. Jennette Kettle Jose [name] is a 85 y.o. male with medical history significant of advanced dementia, DM2, CHF, HTN. Patient with suspected aspiration syndrome, with 3 admissions earlier this year for recurrent PNAs.  As far as I am aware and can tell based on SNF provider notes as well as discharge summary from last admission, the patient is now DNR/DNI, but still do everything medically short of intubation to treat.  (Im unable to get a-hold of family members at this time despite calling all numbers in the chart).  Patient brought in to ED with respiratory distress.  From histroy in chart it seems he has been SOB for past couple of days.  O2 sat was 85% on 2L.  Assessment & Plan   Admitted earlier today by Dr. Jennette Kettle. See H&P for details.  Acute hypoxic respiratory failure -possibly due to aspiration pneumonioa vs CHF exacerbation -presented with oxygen saturation of 85% on 2L -Continue supplemental oxygen -COVID negative -Respiratory viral panel negative  Sepsis secondary to possible aspiration pneumonia  -On admission, patient febrile with leukocytosis, tachycardia, tachypnea -Chest x-ray showed small effusions and bibasilar airspace disease, atelectasis versus pneumonia -Patient given unasyn -lactic acid up to 3.5, procalcitonin 0.12 -Strep pneumonia urine antigen negative -will hold off on giving IVF given CHF  Acute systolic congestive heart failure -Echocardiogram April 26, 2018 showed an EF of 35 to 40%.  Diffuse hypokinesis. -BNP of 1203.8 on admission -Was given 1 dose of IV Lasix 40 mg in the emergency department -Take and output, daily weights  Essential hypertension -BP meds currently held  Diabetes mellitus, type II -Metformin held -Continue ISS and CBG monitoring  History of GI bleed -In Feb 2020  -monitor CBC  DVT Prophylaxis  SCDs  Code Status: DNR  Family Communication: none at bedside  Disposition Plan: Admitted. Pending improvement  Consultants None  Procedures  None  Antibiotics   Anti-infectives (From admission, onward)   Start     Dose/Rate Route Frequency Ordered Stop   09/01/18 0130  vancomycin (VANCOCIN) 1,500 mg in sodium chloride 0.9 % 500 mL IVPB     1,500 mg 250 mL/hr over 120 Minutes Intravenous Every 24 hours 08/31/18 0109     08/31/18 0200  Ampicillin-Sulbactam (UNASYN) 3 g in sodium chloride 0.9 % 100 mL IVPB     3 g 200 mL/hr over 30 Minutes Intravenous Every 8 hours 08/31/18 0147     08/31/18 0115  ceFEPIme (MAXIPIME) 2 g in sodium chloride 0.9 % 100 mL IVPB     2 g 200 mL/hr over 30 Minutes Intravenous  Once 08/31/18 0103 08/31/18 0218   08/31/18 0115  vancomycin (VANCOCIN) 1,500 mg in sodium chloride 0.9 % 500 mL IVPB     1,500 mg 250 mL/hr over 120 Minutes Intravenous  Once 08/31/18 0104 08/31/18 0341   08/31/18 0100  ceFEPIme (MAXIPIME) 1 g in sodium chloride 0.9 % 100 mL IVPB  Status:  Discontinued     1 g 200 mL/hr over 30 Minutes Intravenous  Once 08/31/18 0057 08/31/18 0103      Subjective:   Jose [name] seen and examined today.  Patient with dementia. Objective:   Vitals:   08/31/18 0258 08/31/18 0517 08/31/18 0732 08/31/18 1147  BP: 119/72 104/74 (!) 128/97 128/88  Pulse: 61 100 95 93  Resp: Marland Kitchen)  34 (!) 22 (!) 23 (!) 24  Temp: (!) 97 F (36.1 C) 98.2 F (36.8 C) 97.6 F (36.4 C) (!) 97.5 F (36.4 C)  TempSrc: Axillary Axillary Oral Oral  SpO2: 92% 95% 97% 97%  Weight: 79.7 kg     Height:        Intake/Output Summary (Last 24 hours) at 08/31/2018 1421 Last data filed at 08/31/2018 1200 Gross per 24 hour  Intake 910 ml  Output 1000 ml  Net -90 ml   Filed Weights   08/30/18 2329 08/31/18 0258  Weight: 78.7 kg 79.7 kg    Exam  General: Well developed, chronically ill appearing  HEENT: NCAT,  mucous membranes  moist.   Cardiovascular: S1 S2 aucultated  Respiratory: diminished breath sounds, exp wheezing  Abdomen: Soft, nontender, nondistended, + bowel sounds  Extremities: warm dry without cyanosis clubbing or edema  Neuro: AAOx1 (self only), patient with dementia   Data Reviewed: I have personally reviewed following labs and imaging studies  CBC: Recent Labs  Lab 08/30/18 2328 08/31/18 0547  WBC 16.3* 11.6*  NEUTROABS 13.7*  --   HGB 14.3 12.6*  HCT 44.6 38.9*  MCV 94.3 93.1  PLT 170 157*   Basic Metabolic Panel: Recent Labs  Lab 08/30/18 2328 08/31/18 0547  NA 134* 134*  K 4.9 4.6  CL 100 99  CO2 20* 22  GLUCOSE 398* 354*  BUN 23 25*  CREATININE 1.00 1.06  CALCIUM 8.9 8.8*   GFR: Estimated Creatinine Clearance: 56.4 mL/min (by C-G formula based on SCr of 1.06 mg/dL). Liver Function Tests: No results for input(s): AST, ALT, ALKPHOS, BILITOT, PROT, ALBUMIN in the last 168 hours. No results for input(s): LIPASE, AMYLASE in the last 168 hours. No results for input(s): AMMONIA in the last 168 hours. Coagulation Profile: No results for input(s): INR, PROTIME in the last 168 hours. Cardiac Enzymes: Recent Labs  Lab 08/30/18 2328  TROPONINI 0.03*   BNP (last 3 results) No results for input(s): PROBNP in the last 8760 hours. HbA1C: No results for input(s): HGBA1C in the last 72 hours. CBG: Recent Labs  Lab 08/31/18 0333 08/31/18 0811 08/31/18 0935 08/31/18 1226  GLUCAP 292* 240* 235* 169*   Lipid Profile: No results for input(s): CHOL, HDL, LDLCALC, TRIG, CHOLHDL, LDLDIRECT in the last 72 hours. Thyroid Function Tests: No results for input(s): TSH, T4TOTAL, FREET4, T3FREE, THYROIDAB in the last 72 hours. Anemia Panel: No results for input(s): VITAMINB12, FOLATE, FERRITIN, TIBC, IRON, RETICCTPCT in the last 72 hours. Urine analysis:    Component Value Date/Time   COLORURINE YELLOW 05/01/2018 1109   APPEARANCEUR CLEAR 05/01/2018 1109   LABSPEC 1.011  05/01/2018 1109   PHURINE 6.0 05/01/2018 1109   GLUCOSEU NEGATIVE 05/01/2018 1109   HGBUR LARGE (A) 05/01/2018 1109   BILIRUBINUR NEGATIVE 05/01/2018 1109   Titanic 05/01/2018 1109   PROTEINUR NEGATIVE 05/01/2018 1109   UROBILINOGEN 0.2 06/24/2014 2112   NITRITE NEGATIVE 05/01/2018 1109   LEUKOCYTESUR NEGATIVE 05/01/2018 1109   Sepsis Labs: @LABRCNTIP (procalcitonin:4,lacticidven:4)  ) Recent Results (from the past 240 hour(s))  SARS Coronavirus 2 (CEPHEID- Performed in [place] hospital lab), Hosp Order     Status: None   Collection Time: 08/30/18 11:44 PM  Result Value Ref Range Status   SARS Coronavirus 2 NEGATIVE NEGATIVE Final    Comment: (NOTE) If result is NEGATIVE SARS-CoV-2 target nucleic acids are NOT DETECTED. The SARS-CoV-2 RNA is generally detectable in upper and lower  respiratory specimens during the acute phase  of infection. The lowest  concentration of SARS-CoV-2 viral copies this assay can detect is 250  copies / mL. A negative result does not preclude SARS-CoV-2 infection  and should not be used as the sole basis for treatment or other  patient management decisions.  A negative result may occur with  improper specimen collection / handling, submission of specimen other  than nasopharyngeal swab, presence of viral mutation(s) within the  areas targeted by this assay, and inadequate number of viral copies  (<250 copies / mL). A negative result must be combined with clinical  observations, patient history, and epidemiological information. If result is POSITIVE SARS-CoV-2 target nucleic acids are DETECTED. The SARS-CoV-2 RNA is generally detectable in upper and lower  respiratory specimens dur ing the acute phase of infection.  Positive  results are indicative of active infection with SARS-CoV-2.  Clinical  correlation with patient history and other diagnostic information is  necessary to determine patient infection status.  Positive results do   not rule out bacterial infection or co-infection with other viruses. If result is PRESUMPTIVE POSTIVE SARS-CoV-2 nucleic acids MAY BE PRESENT.   A presumptive positive result was obtained on the submitted specimen  and confirmed on repeat testing.  While 2019 novel coronavirus  (SARS-CoV-2) nucleic acids may be present in the submitted sample  additional confirmatory testing may be necessary for epidemiological  and / or clinical management purposes  to differentiate between  SARS-CoV-2 and other Sarbecovirus currently known to infect humans.  If clinically indicated additional testing with an alternate test  methodology [phone]) is advised. The SARS-CoV-2 RNA is generally  detectable in upper and lower respiratory sp ecimens during the acute  phase of infection. The expected result is Negative. Fact Sheet for Patients:  StrictlyIdeas.no Fact Sheet for Healthcare Providers: BankingDealers.co.za This test is not yet approved or cleared by the [place] FDA and has been authorized for detection and/or diagnosis of SARS-CoV-2 by FDA under an Emergency Use Authorization (EUA).  This EUA will remain in effect (meaning this test can be used) for the duration of the COVID-19 declaration under Section 564(b)(1) of the Act, 21 U.S.C. section 360bbb-3(b)(1), unless the authorization is terminated or revoked sooner. Performed at [facility], [place] [address], [place], [place] [address]   Blood culture (routine x 2)     Status: None (Preliminary result)   Collection Time: 08/31/18  1:10 AM  Result Value Ref Range Status   Specimen Description BLOOD RIGHT ARM  Final   Special Requests   Final    BOTTLES DRAWN AEROBIC AND ANAEROBIC Blood Culture results may not be optimal due to an excessive volume of blood received in culture bottles Performed at [facility] [address], [place], [place] [address]    Culture PENDING   Incomplete   Report Status PENDING  Incomplete  MRSA PCR Screening     Status: Abnormal   Collection Time: 08/31/18  3:15 AM  Result Value Ref Range Status   MRSA by PCR POSITIVE (A) NEGATIVE Final    Comment:        The GeneXpert MRSA Assay (FDA approved for NASAL specimens only), is one component of a comprehensive MRSA colonization surveillance program. It is not intended to diagnose MRSA infection nor to guide or monitor treatment for MRSA infections. RESULT CALLED TO, READ BACK BY AND VERIFIED WITH: IRBY,T RN 0532 08/31/2018 MITCHELL,L Performed at [facility], [place] [address] [place], [place] [address]  Respiratory Panel by PCR     Status: None   Collection Time: 08/31/18  3:15 AM  Result Value Ref Range Status   Adenovirus NOT DETECTED NOT DETECTED Final   Coronavirus 229E NOT DETECTED NOT DETECTED Final    Comment: (NOTE) The Coronavirus on the Respiratory Panel, DOES NOT test for the novel  Coronavirus (2019 nCoV)    Coronavirus HKU1 NOT DETECTED NOT DETECTED Final   Coronavirus NL63 NOT DETECTED NOT DETECTED Final   Coronavirus OC43 NOT DETECTED NOT DETECTED Final   Metapneumovirus NOT DETECTED NOT DETECTED Final   Rhinovirus / Enterovirus NOT DETECTED NOT DETECTED Final   Influenza A NOT DETECTED NOT DETECTED Final   Influenza B NOT DETECTED NOT DETECTED Final   Parainfluenza Virus 1 NOT DETECTED NOT DETECTED Final   Parainfluenza Virus 2 NOT DETECTED NOT DETECTED Final   Parainfluenza Virus 3 NOT DETECTED NOT DETECTED Final   Parainfluenza Virus 4 NOT DETECTED NOT DETECTED Final   Respiratory Syncytial Virus NOT DETECTED NOT DETECTED Final   Bordetella pertussis NOT DETECTED NOT DETECTED Final   Chlamydophila pneumoniae NOT DETECTED NOT DETECTED Final   Mycoplasma pneumoniae NOT DETECTED NOT DETECTED Final    Comment: Performed at [facility], [address] [place], [place] [address]      Radiology Studies: Dg Chest Portable 1 View   Result Date: 08/31/2018 CLINICAL DATA:  Shortness of breath EXAM: PORTABLE CHEST 1 VIEW COMPARISON:  05/21/2018 FINDINGS: Low lung volumes. Probable small effusions. Bibasilar airspace disease. Stable cardiomediastinal silhouette. No pneumothorax. IMPRESSION: Low lung volumes with suspected small effusions and bibasilar airspace disease, atelectasis versus pneumonia. Electronically Signed   By: Donavan Foil M.D.   On: 08/31/2018 00:35     Scheduled Meds: . chlorhexidine  15 mL Mouth Rinse BID  . Chlorhexidine Gluconate Cloth  6 each Topical Q0600  . insulin aspart  0-9 Units Subcutaneous Q4H  . mouth rinse  15 mL Mouth Rinse q12n4p  . mupirocin ointment  1 application Nasal BID   Continuous Infusions: . ampicillin-sulbactam (UNASYN) IV 3 g (08/31/18 0939)  . [START ON 09/01/2018] vancomycin       LOS: 0 days   Time Spent in minutes   30 minutes  Demani Mcbrien D.O. on 08/31/2018 at 2:21 PM  Between 7am to 7pm - Please see pager noted on [email or web address]  After 7pm go to [email or web address]  And look for the night coverage person covering for me after hours  [facility] Office  [phone]

## 2018-08-31 NOTE — Progress Notes (Signed)
Lactate 3.5.  Patient however with significantly improved respiratory status.  Now comfortable on only on 4L [place] with RR 22 (much better than 15L NRB with RR 50).  HR 149, BP 702 systolic.  Creat 1.0 BUN 25.  Im gonna hold off on ordering IVF for the moment and see where next lactate goes in 2h.

## 2018-08-31 NOTE — ED Notes (Signed)
Nitro paste removed and area of chest washed.

## 2018-08-31 NOTE — Progress Notes (Signed)
Pt arrived from ED to [id number]. Pt alert to self only but cooperative and calm. CHG bath given and telemetry applied. Orders released and antibiotics given. Pt oriented to room and call bell within reach. Bed placed in lowest position. Will continue to monitor. Lajoyce Corners, RN

## 2018-09-01 LAB — BASIC METABOLIC PANEL
Anion gap: 12 (ref 5–15)
BUN: 17 mg/dL (ref 8–23)
CO2: 23 mmol/L (ref 22–32)
Calcium: 8.7 mg/dL — ABNORMAL LOW (ref 8.9–10.3)
Chloride: 106 mmol/L (ref 98–111)
Creatinine, Ser: 0.77 mg/dL (ref 0.61–1.24)
GFR calc Af Amer: >60 mL/min (ref >60)
GFR calc non Af Amer: >60 mL/min (ref >60)
Glucose, Bld: 140 mg/dL — ABNORMAL HIGH (ref 70–99)
Potassium: 3.9 mmol/L (ref 3.5–5.1)
Sodium: 141 mmol/L (ref 135–145)

## 2018-09-01 LAB — GLUCOSE, CAPILLARY
Glucose-Capillary: 138 mg/dL — ABNORMAL HIGH (ref 70–99)
Glucose-Capillary: 142 mg/dL — ABNORMAL HIGH (ref 70–99)
Glucose-Capillary: 187 mg/dL — ABNORMAL HIGH (ref 70–99)
Glucose-Capillary: 187 mg/dL — ABNORMAL HIGH (ref 70–99)
Glucose-Capillary: 191 mg/dL — ABNORMAL HIGH (ref 70–99)
Glucose-Capillary: 288 mg/dL — ABNORMAL HIGH (ref 70–99)

## 2018-09-01 LAB — LACTIC ACID, PLASMA: Lactic Acid, Venous: 1.4 mmol/L (ref 0.5–1.9)

## 2018-09-01 LAB — CBC
HCT: 38.3 % — ABNORMAL LOW (ref 39.0–52.0)
Hemoglobin: 12.6 g/dL — ABNORMAL LOW (ref 13.0–17.0)
MCH: 30.7 pg (ref 26.0–34.0)
MCHC: 32.9 g/dL (ref 30.0–36.0)
MCV: 93.4 fL (ref 80.0–100.0)
Platelets: 131 10*3/uL — ABNORMAL LOW (ref 150–400)
RBC: 4.1 MIL/uL — ABNORMAL LOW (ref 4.22–5.81)
RDW: 14.2 % (ref 11.5–15.5)
WBC: 9.5 10*3/uL (ref 4.0–10.5)
nRBC: 0 % (ref 0.0–0.2)

## 2018-09-01 MED ORDER — RESOURCE THICKENUP CLEAR PO POWD
Freq: Once | ORAL | Status: DC
  Filled 2018-09-01: qty 125

## 2018-09-01 NOTE — TOC Initial Note (Signed)
Transition of Care [facility]) - Initial/Assessment Note    Patient Details  Name: Jose [name] MRN: [id number] Date of Birth: [birth year]  [facility]) CM/SW Contact:    Vinie Sill, [place] Phone Number: 09/01/2018, 3:59 PM  Clinical Narrative:                 CSW spoke with the patient's wife, [name] at [phone] mobile, home [phone] and completed readmission assessment, there were no concerns.   Patient's wife states they live at [facility] but once the patient is medically ready for discharge he will go to [facility] at [place] for 14 days isolation. Patient will need PTAR to transport once he is discharged.   Thurmond Butts, MSW, [place] Clinical Social Worker [phone]     Barriers to Discharge: Continued Medical Work up   Patient Goals and CMS Choice        Expected Discharge Plan and Services                                                Prior Living Arrangements/Services     Patient language and need for interpreter reviewed:: No        Need for Family Participation in Patient Care: Yes (Comment) Care giver support system in place?: Yes (comment)   Criminal Activity/Legal Involvement Pertinent to Current Situation/Hospitalization: No - Comment as needed  Activities of Daily Living      Permission Sought/Granted Permission sought to share information with : Facility Sport and exercise psychologist, Family Supports Permission granted to share information with : Yes, Verbal Permission Granted  Share Information with NAME: [name], [name]  Permission granted to share info w AGENCY: [facility]  Permission granted to share info w Relationship: spouse  Permission granted to share info w Contact Information: [phone]  Emotional Assessment Appearance:: Other (Comment Required(unable to assess ) Attitude/Demeanor/Rapport: Unable to Assess Affect (typically observed): Unable to Assess Orientation: : Oriented to Self Alcohol /  Substance Use: Not Applicable Psych Involvement: No (comment)  Admission diagnosis:  Acute respiratory distress [R06.03] Patient Active Problem List   Diagnosis Date Noted  . Adult failure to thrive   . Upper GI bleed   . Acute respiratory failure with hypoxia ([place])   . Palliative care encounter   . Goals of care, counseling/discussion   . Eye irritation 05/11/2018  . DNR (do not resuscitate) discussion   . Palliative care by specialist   . Aspiration pneumonia ([place]) 04/26/2018  . Constipation 04/15/2018  . HTN (hypertension) 04/15/2018  . Iron deficiency anemia 04/08/2018  . Actinic keratosis 06/21/2017  . Moderate protein-calorie malnutrition ([place]) 03/06/2017  . Unsteady gait 03/06/2017  . Urinary incontinence 12/31/2016  . Dysphagia 12/31/2016  . Dermatitis, seborrheic 12/14/2016  . Edema 12/14/2016  . Hyperlipidemia associated with type 2 diabetes mellitus ([place]) 12/06/2016  . Functional urinary incontinence 12/06/2016  . Chronic bilateral low back pain with bilateral sciatica 12/06/2016  . Chronic combined systolic and diastolic congestive heart failure ([place]) 12/06/2016  . Major depression, recurrent, chronic ([place]) 11/23/2016  . OAB (overactive bladder) 11/23/2016  . Dermatitis 08/23/2016  . External hemorrhoid 07/26/2016  . Depression with anxiety 07/09/2016  . Thrombocytopenia ([place]) 06/28/2016  . Closed left hip fracture, sequela 06/22/2016  . Type 2 diabetes mellitus with diabetic chronic kidney disease ([place]) 06/22/2016  . Hyperlipidemia LDL goal <70 06/22/2016  .  Alzheimer disease ([place]) 11/12/2012  . History of bilateral hip replacements 11/12/2012   PCP:  [name], MD Pharmacy:   [facility] [address] - [place], [place]. [place] [address] Phone: [phone] Fax: [phone]     Social Determinants of Health (SDOH) Interventions    Readmission Risk Interventions Readmission Risk  Prevention Plan 09/01/2018  Transportation Screening Complete  PCP or Specialist Appt within 3-5 Days Complete  HRI or [place] Complete  Social Work Consult for [place] Planning/Counseling Complete  Palliative Care Screening Not Applicable  Medication Review Press photographer) Complete  Some recent data might be hidden

## 2018-09-01 NOTE — Progress Notes (Signed)
PROGRESS NOTE    Jose [name]  PPJ:[id number] DOB: 21-May-1930 DOA: 08/30/2018 PCP: [name], MD   Brief Narrative:  HPI on 08/31/2018 by Dr. Jennette Kettle Jose [name] is a 85 y.o. male with medical history significant of advanced dementia, DM2, CHF, HTN. Patient with suspected aspiration syndrome, with 3 admissions earlier this year for recurrent PNAs.  As far as I am aware and can tell based on SNF provider notes as well as discharge summary from last admission, the patient is now DNR/DNI, but still do everything medically short of intubation to treat.  (Im unable to get a-hold of family members at this time despite calling all numbers in the chart).  Patient brought in to ED with respiratory distress.  From histroy in chart it seems he has been SOB for past couple of days.  O2 sat was 85% on 2L.  Interim history Admitted for hypoxic respiratory failure secondary to possible aspiration pneumonia.  Currently on antibiotics.  Question of CHF exacerbation and given IV Lasix. Assessment & Plan   Acute hypoxic respiratory failure -possibly due to aspiration pneumonioa vs CHF exacerbation -presented with oxygen saturation of 85% on 2L -Continue supplemental oxygen -COVID negative -Respiratory viral panel negative  Sepsis secondary to possible aspiration pneumonia  -On admission, patient febrile with leukocytosis, tachycardia, tachypnea-improving -Chest x-ray showed small effusions and bibasilar airspace disease, atelectasis versus pneumonia -Patient given unasyn -lactic acid up to 3.5, procalcitonin 0.12- also improving- lactic acid down to 1.4 today -Strep pneumonia urine antigen negative -will hold off on giving IVF given CHF  Acute systolic congestive heart failure -Echocardiogram April 26, 2018 showed an EF of 35 to 40%.  Diffuse hypokinesis. -BNP of 1203.8 on admission -Was given 1 dose of IV Lasix 40 mg in the emergency department -Take and output, daily weights   Essential hypertension -BP meds currently held -BP stable  Diabetes mellitus, type II -Metformin held -Continue ISS and CBG monitoring  History of GI bleed -In Feb 2020 -monitor CBC- hemoglobin currently stable  DVT Prophylaxis  SCDs  Code Status: DNR  Family Communication: none at bedside  Disposition Plan: Admitted. Pending improvement. dispo TBD- suspect SNF  Consultants None  Procedures  None  Antibiotics   Anti-infectives (From admission, onward)   Start     Dose/Rate Route Frequency Ordered Stop   09/01/18 0130  vancomycin (VANCOCIN) 1,500 mg in sodium chloride 0.9 % 500 mL IVPB     1,500 mg 250 mL/hr over 120 Minutes Intravenous Every 24 hours 08/31/18 0109     08/31/18 0200  Ampicillin-Sulbactam (UNASYN) 3 g in sodium chloride 0.9 % 100 mL IVPB     3 g 200 mL/hr over 30 Minutes Intravenous Every 8 hours 08/31/18 0147     08/31/18 0115  ceFEPIme (MAXIPIME) 2 g in sodium chloride 0.9 % 100 mL IVPB     2 g 200 mL/hr over 30 Minutes Intravenous  Once 08/31/18 0103 08/31/18 0218   08/31/18 0115  vancomycin (VANCOCIN) 1,500 mg in sodium chloride 0.9 % 500 mL IVPB     1,500 mg 250 mL/hr over 120 Minutes Intravenous  Once 08/31/18 0104 08/31/18 0341   08/31/18 0100  ceFEPIme (MAXIPIME) 1 g in sodium chloride 0.9 % 100 mL IVPB  Status:  Discontinued     1 g 200 mL/hr over 30 Minutes Intravenous  Once 08/31/18 0057 08/31/18 0103      Subjective:   [name] seen and examined today.  Patient with dementia.  Does not answer questions appropriately.  Objective:   Vitals:   08/31/18 2308 09/01/18 0343 09/01/18 0730 09/01/18 1228  BP: 132/79 137/84 (!) 140/47 132/83  Pulse: 100 (!) 297 92 97  Resp: (!) 27 (!) 26 (!) 22 (!) 24  Temp: 98 F (36.7 C) 98.5 F (36.9 C) 97.6 F (36.4 C) 98.4 F (36.9 C)  TempSrc: Oral Oral Oral Axillary  SpO2: 96% 98% 98% 98%  Weight:      Height:        Intake/Output Summary (Last 24 hours) at 09/01/2018 1314 Last data filed at  09/01/2018 0800 Gross per 24 hour  Intake 580.94 ml  Output 775 ml  Net -194.06 ml   Filed Weights   08/30/18 2329 08/31/18 0258  Weight: 78.7 kg 79.7 kg   Exam  General: Well developed, chronically ill-appearing, NAD  HEENT: NCAT, Pmucous membranes moist.   Cardiovascular: S1 S2 auscultated  Respiratory: Diminished breath sounds, some expiratory wheezing  Abdomen: Soft, nontender, nondistended, + bowel sounds  Extremities: warm dry without cyanosis clubbing or edema  Neuro: AAOx1 (self only), patient with dementia   Data Reviewed: I have personally reviewed following labs and imaging studies  CBC: Recent Labs  Lab 08/30/18 2328 08/31/18 0547 09/01/18 0821  WBC 16.3* 11.6* 9.5  NEUTROABS 13.7*  --   --   HGB 14.3 12.6* 12.6*  HCT 44.6 38.9* 38.3*  MCV 94.3 93.1 93.4  PLT 170 127* 924*   Basic Metabolic Panel: Recent Labs  Lab 08/30/18 2328 08/31/18 0547 09/01/18 0821  NA 134* 134* 141  K 4.9 4.6 3.9  CL 100 99 106  CO2 20* 22 23  GLUCOSE 398* 354* 140*  BUN 23 25* 17  CREATININE 1.00 1.06 0.77  CALCIUM 8.9 8.8* 8.7*   GFR: Estimated Creatinine Clearance: 74.7 mL/min (by C-G formula based on SCr of 0.77 mg/dL). Liver Function Tests: No results for input(s): AST, ALT, ALKPHOS, BILITOT, PROT, ALBUMIN in the last 168 hours. No results for input(s): LIPASE, AMYLASE in the last 168 hours. No results for input(s): AMMONIA in the last 168 hours. Coagulation Profile: No results for input(s): INR, PROTIME in the last 168 hours. Cardiac Enzymes: Recent Labs  Lab 08/30/18 2328  TROPONINI 0.03*   BNP (last 3 results) No results for input(s): PROBNP in the last 8760 hours. HbA1C: No results for input(s): HGBA1C in the last 72 hours. CBG: Recent Labs  Lab 08/31/18 1640 08/31/18 2018 08/31/18 2354 09/01/18 0339 09/01/18 0934  GLUCAP 105* 146* 144* 142* 138*   Lipid Profile: No results for input(s): CHOL, HDL, LDLCALC, TRIG, CHOLHDL, LDLDIRECT in the  last 72 hours. Thyroid Function Tests: No results for input(s): TSH, T4TOTAL, FREET4, T3FREE, THYROIDAB in the last 72 hours. Anemia Panel: No results for input(s): VITAMINB12, FOLATE, FERRITIN, TIBC, IRON, RETICCTPCT in the last 72 hours. Urine analysis:    Component Value Date/Time   COLORURINE YELLOW 05/01/2018 1109   [place] 05/01/2018 1109   LABSPEC 1.011 05/01/2018 1109   PHURINE 6.0 05/01/2018 1109   GLUCOSEU NEGATIVE 05/01/2018 1109   HGBUR LARGE (A) 05/01/2018 1109   BILIRUBINUR NEGATIVE 05/01/2018 1109   [place] 05/01/2018 1109   PROTEINUR NEGATIVE 05/01/2018 1109   UROBILINOGEN 0.2 06/24/2014 2112   NITRITE NEGATIVE 05/01/2018 1109   LEUKOCYTESUR NEGATIVE 05/01/2018 1109   Sepsis Labs: @LABRCNTIP (procalcitonin:4,lacticidven:4)  ) Recent Results (from the past 240 hour(s))  SARS Coronavirus 2 (CEPHEID- Performed in [place] [facility] lab), [facility]  Status: None   Collection Time: 08/30/18 11:44 PM  Result Value Ref Range Status   SARS Coronavirus 2 NEGATIVE NEGATIVE Final    Comment: (NOTE) If result is NEGATIVE SARS-CoV-2 target nucleic acids are NOT DETECTED. The SARS-CoV-2 RNA is generally detectable in upper and lower  respiratory specimens during the acute phase of infection. The lowest  concentration of SARS-CoV-2 viral copies this assay can detect is 250  copies / mL. A negative result does not preclude SARS-CoV-2 infection  and should not be used as the sole basis for treatment or other  patient management decisions.  A negative result may occur with  improper specimen collection / handling, submission of specimen other  than nasopharyngeal swab, presence of viral mutation(s) within the  areas targeted by this assay, and inadequate number of viral copies  (<250 copies / mL). A negative result must be combined with clinical  observations, patient history, and epidemiological information. If result is POSITIVE SARS-CoV-2  target nucleic acids are DETECTED. The SARS-CoV-2 RNA is generally detectable in upper and lower  respiratory specimens dur ing the acute phase of infection.  Positive  results are indicative of active infection with SARS-CoV-2.  Clinical  correlation with patient history and other diagnostic information is  necessary to determine patient infection status.  Positive results do  not rule out bacterial infection or co-infection with other viruses. If result is PRESUMPTIVE POSTIVE SARS-CoV-2 nucleic acids MAY BE PRESENT.   A presumptive positive result was obtained on the submitted specimen  and confirmed on repeat testing.  While 2019 novel coronavirus  (SARS-CoV-2) nucleic acids may be present in the submitted sample  additional confirmatory testing may be necessary for epidemiological  and / or clinical management purposes  to differentiate between  SARS-CoV-2 and other Sarbecovirus currently known to infect humans.  If clinically indicated additional testing with an alternate test  methodology [phone]) is advised. The SARS-CoV-2 RNA is generally  detectable in upper and lower respiratory sp ecimens during the acute  phase of infection. The expected result is Negative. Fact Sheet for Patients:  StrictlyIdeas.no Fact Sheet for Healthcare Providers: BankingDealers.co.za This test is not yet approved or cleared by the [place] FDA and has been authorized for detection and/or diagnosis of SARS-CoV-2 by FDA under an Emergency Use Authorization (EUA).  This EUA will remain in effect (meaning this test can be used) for the duration of the COVID-19 declaration under Section 564(b)(1) of the Act, 21 U.S.C. section 360bbb-3(b)(1), unless the authorization is terminated or revoked sooner. Performed at [facility], [place] [address], [place], [place] [address]   Blood culture (routine x 2)     Status: None (Preliminary result)    Collection Time: 08/31/18  1:02 AM  Result Value Ref Range Status   Specimen Description BLOOD RIGHT HAND  Final   Special Requests   Final    BOTTLES DRAWN AEROBIC ONLY Blood Culture adequate volume   Culture   Final    NO GROWTH 1 DAY Performed at [facility], [place] [address] [place], [place] [address]    Report Status PENDING  Incomplete  Blood culture (routine x 2)     Status: None (Preliminary result)   Collection Time: 08/31/18  1:10 AM  Result Value Ref Range Status   Specimen Description BLOOD RIGHT ARM  Final   Special Requests   Final    BOTTLES DRAWN AEROBIC AND ANAEROBIC Blood Culture results may not be optimal due to an  excessive volume of blood received in culture bottles   Culture   Final    NO GROWTH 1 DAY Performed at [facility], [place] [address], [place], [place] [address]    Report Status PENDING  Incomplete  MRSA PCR Screening     Status: Abnormal   Collection Time: 08/31/18  3:15 AM  Result Value Ref Range Status   MRSA by PCR POSITIVE (A) NEGATIVE Final    Comment:        The GeneXpert MRSA Assay (FDA approved for NASAL specimens only), is one component of a comprehensive MRSA colonization surveillance program. It is not intended to diagnose MRSA infection nor to guide or monitor treatment for MRSA infections. RESULT CALLED TO, READ BACK BY AND VERIFIED WITH: IRBY,T RN 0532 08/31/2018 MITCHELL,L Performed at [facility], [place] [address] [place], [place] [address]   Respiratory Panel by PCR     Status: None   Collection Time: 08/31/18  3:15 AM  Result Value Ref Range Status   Adenovirus NOT DETECTED NOT DETECTED Final   Coronavirus 229E NOT DETECTED NOT DETECTED Final    Comment: (NOTE) The Coronavirus on the Respiratory Panel, DOES NOT test for the novel  Coronavirus (2019 nCoV)    Coronavirus HKU1 NOT DETECTED NOT DETECTED Final   Coronavirus NL63 NOT DETECTED NOT DETECTED Final   Coronavirus OC43 NOT DETECTED NOT DETECTED  Final   Metapneumovirus NOT DETECTED NOT DETECTED Final   Rhinovirus / Enterovirus NOT DETECTED NOT DETECTED Final   Influenza A NOT DETECTED NOT DETECTED Final   Influenza B NOT DETECTED NOT DETECTED Final   Parainfluenza Virus 1 NOT DETECTED NOT DETECTED Final   Parainfluenza Virus 2 NOT DETECTED NOT DETECTED Final   Parainfluenza Virus 3 NOT DETECTED NOT DETECTED Final   Parainfluenza Virus 4 NOT DETECTED NOT DETECTED Final   Respiratory Syncytial Virus NOT DETECTED NOT DETECTED Final   Bordetella pertussis NOT DETECTED NOT DETECTED Final   Chlamydophila pneumoniae NOT DETECTED NOT DETECTED Final   Mycoplasma pneumoniae NOT DETECTED NOT DETECTED Final    Comment: Performed at [facility], [place]. [address], [place], [place] [address]      Radiology Studies: Dg Chest Portable 1 View  Result Date: 08/31/2018 CLINICAL DATA:  Shortness of breath EXAM: PORTABLE CHEST 1 VIEW COMPARISON:  05/21/2018 FINDINGS: Low lung volumes. Probable small effusions. Bibasilar airspace disease. Stable cardiomediastinal silhouette. No pneumothorax. IMPRESSION: Low lung volumes with suspected small effusions and bibasilar airspace disease, atelectasis versus pneumonia. Electronically Signed   By: Donavan Foil M.D.   On: 08/31/2018 00:35     Scheduled Meds: . chlorhexidine  15 mL Mouth Rinse BID  . Chlorhexidine Gluconate Cloth  6 each Topical Q0600  . insulin aspart  0-9 Units Subcutaneous Q4H  . mouth rinse  15 mL Mouth Rinse q12n4p  . mupirocin ointment  1 application Nasal BID  . Resource ThickenUp Clear   Oral Once   Continuous Infusions: . ampicillin-sulbactam (UNASYN) IV 3 g (09/01/18 0956)  . vancomycin 250 mL/hr at 09/01/18 0348     LOS: 1 day   Time Spent in minutes   30 minutes  Yamen Castrogiovanni D.O. on 09/01/2018 at 1:14 PM  Between 7am to 7pm - Please see pager noted on [email or web address]  After 7pm go to [email or web address]  And look for the night coverage person covering for me after hours   [facility] Office  [phone]

## 2018-09-01 NOTE — Progress Notes (Signed)
  Speech Language Pathology Treatment: Dysphagia  Patient Details Name: Jose [name] MRN: [id number] DOB: Nov 28, 1930 Today's Date: 09/01/2018 Time: 3825-0539 SLP Time Calculation (min) (ACUTE ONLY): 30 min  Assessment / Plan / Recommendation Clinical Impression  F/U after yesterday's swallow assessment.  Spoke with Jose [name], pt's wife, over the phone, who reported baseline diet of dysphagia 1, honey thick liquids with full supervision for all meals.  Reviewed today's session with her - pt presented with improved anticipation/oral attention.  He fed himself a container of applesauce with no s/s of aspiration and improved oral control.  Trials of thin water elicited immediate and consistent cough response; honey-thick liquids elicited occasional, delayed cough (consistent with behaviors PTA per MD notes from [facility]).  Reviewed with Jose [name] the swallowing difficulties that go hand-in-hand with progressing dementia, the likelihood of ongoing aspiration despite best efforts to prevent it, even with strict precautions and diet modifications.  She verbalized understanding.  Recommend resuming dysphagia 1 diet, honey-thick liquids - pt can feed himself, but needs monitoring, set-up, close supervision to ensure safety and reduce rate/bite size. SLP will follow briefly for safety/education.   HPI HPI: Pt is an 85 yo male admitted with concern for PNA after suspected aspiration event 2 days prior to admit when he was given meds while lying down. Per MD note, daughter says he has otherwise been doing well with dysphagia diet and honey thick liquids when upright. Pt had three admissions earlier this year for recurrent PNA. MBS most recently in January 2020 recommended thin liquids but with silent aspiration of larger boluses. He was downgraded clinically to Dys 1 diet/nectar thick liquids (at family request) in February 2020 due to coughing with intake and increased need for BiPAP. PMH also includes: dementia,  DM2, CHF, HTN.      SLP Plan  Continue with current plan of care       Recommendations  Diet recommendations: Dysphagia 1 (puree);Honey-thick liquid Liquids provided via: Cup Medication Administration: Crushed with puree Supervision: Staff to assist with self feeding Compensations: Minimize environmental distractions;Slow rate;Small sips/bites                Oral Care Recommendations: Oral care BID Follow up Recommendations: Skilled Nursing facility SLP Visit Diagnosis: Dysphagia, unspecified (R13.10) Plan: Continue with current plan of care       Jose [name]. Jose [name] Jose [name], Jose [name] CCC/SLP Acute Rehabilitation Services Office number [phone] Pager [phone]  Jose [name] Jose [name] 09/01/2018, 12:08 PM

## 2018-09-01 NOTE — Consult Note (Signed)
   [facility] CM Inpatient Consult   09/01/2018  [place] 08-04-1930 [id number]    Patient screened forhigh risk scoreof 27% for unplanned readmissionsandhospitalizationsunder his [place]; andto check if potential [place] Management services are needed.  Per chart review and history and physical dated 08/31/18, show asfollows:  Mr. Jose [name] is an 85 y.o. male with medical history significant of advanced dementia, DM2, CHF, HTN. Patient with suspected aspiration syndrome, with 3 admissions earlier this year for recurrent PNAs. Patient was brought in to ED with respiratory distress. From history in chart, it seems he has been SOB for past couple of days with O2 sat was 85% on 2L. He was admitted for hypoxic respiratory failure secondary to possible aspiration pneumonia, currently on antibiotics.  (acute hypoxic respiratory failure, sepsis secondary to possible aspiration pneumonia, acute systolic congestive HF) His primary Care Provider isDr. Veleta Miners with [facility]  Called and spoke with Transition of care CM and states that patient will likelytransition back to SNF(skilled nursing facility). Patient and wife live at [facility] as confirmed by [employer] note.  If there are changes in disposition or needs for community follow-up, please place a [place] Management consult as appropriate.   Of note, [facility] Care Management services does not replace or interfere with any services that are arranged by transition of care case management or social work.    For questions and additional information, please call:  Jose [name], BSN, RN-BC [facility] Liaison Cell: [phone]

## 2018-09-02 ENCOUNTER — Other Ambulatory Visit: Payer: Self-pay

## 2018-09-02 LAB — GLUCOSE, CAPILLARY
Glucose-Capillary: 140 mg/dL — ABNORMAL HIGH (ref 70–99)
Glucose-Capillary: 177 mg/dL — ABNORMAL HIGH (ref 70–99)
Glucose-Capillary: 226 mg/dL — ABNORMAL HIGH (ref 70–99)
Glucose-Capillary: 282 mg/dL — ABNORMAL HIGH (ref 70–99)
Glucose-Capillary: 270 mg/dL — ABNORMAL HIGH (ref 70–99)

## 2018-09-02 MED ORDER — AMOXICILLIN-POT CLAVULANATE 875-125 MG PO TABS
1.0000 | ORAL_TABLET | Freq: Two times a day (BID) | ORAL | Status: DC
  Administered 2018-09-02 – 2018-09-04 (×5): 1 via ORAL
  Filled 2018-09-02 (×5): qty 1

## 2018-09-02 NOTE — TOC Progression Note (Addendum)
Transition of Care [facility]) - Progression Note    Patient Details  Name: Jose [name] MRN: [id number] Date of Birth: 1930-11-24  Transition of Care [facility]) CM/SW [place], LCSW Phone Number: 09/02/2018, 1:24 PM  Clinical Narrative: Per [facility] SN[facility] admissions coordinator, patient will need prior authorization prior to return. Will complete work up and fax clinicals to [facility] once therapy evaluations are in. PT and OT were ordered yesterday.  3:43 pm: Faxed clinicals to [facility] for authorization review.    Barriers to Discharge: Continued Medical Work up  Expected Discharge Plan and Services                                                 Social Determinants of Health (SDOH) Interventions    Readmission Risk Interventions Readmission Risk Prevention Plan 09/01/2018  Transportation Screening Complete  PCP or Specialist Appt within 3-5 Days Complete  HRI or [place] Complete  Social Work Consult for [place] Planning/Counseling Complete  Palliative Care Screening Not Applicable  Medication Review [employer]) Complete  Some recent data might be hidden

## 2018-09-02 NOTE — Evaluation (Signed)
Physical Therapy Evaluation Patient Details Name: Jose [name] MRN: [id number] DOB: October 17, 1930 Today's Date: 09/02/2018   History of Present Illness  85 yo male with onset of low O2 sats and SOB with respiratory distress was admitted from [place]  for asp PNA.  Pt was in sepsis, with hypoxia and in acute CHF, already on ABT.  PMHx:  dementia, SNF resident, CHF, DM, CHF, HTN, PNA, FTT, incontinence, palliative care, GI bleed, anemia, actinic keratosis, depression, OAB, anxiety, L hip fracture, B THA's  Clinical Impression  Pt was seen with OT to initiate mobility and he is esp stiff, unable to verbalize all his needs.  Pt is willing to let PT and OT move him, and should be able to make a bit of progress with sliding transfers and bed to chair until returning to SNF home.   He is willing to try, and his control of sitting makes him a good candidate for PT as he will need to control sit balance to be up and interactive in SNF.   Follow to get LE's stronger, to increase sitting balance and to improve his ability to assist bed to chair transfers to increase socialization and intake.    Follow Up Recommendations SNF    Equipment Recommendations  None recommended by PT    Recommendations for Other Services       Precautions / Restrictions Precautions Precautions: Fall Restrictions Weight Bearing Restrictions: No      Mobility  Bed Mobility Overal bed mobility: Needs Assistance Bed Mobility: Supine to Sit;Sit to Supine     Supine to sit: Total assist;+2 for physical assistance Sit to supine: Total assist;+2 for physical assistance      Transfers Overall transfer level: Needs assistance Equipment used: 2 person hand held assist Transfers: Lateral/Scoot Transfers          Lateral/Scoot Transfers: +2 physical assistance;+2 safety/equipment;Max assist;From elevated surface General transfer comment: NT  Ambulation/Gait             General Gait Details:  unable  Stairs            Wheelchair Mobility    Modified Rankin (Stroke Patients Only)       Balance Overall balance assessment: Needs assistance Sitting-balance support: Bilateral upper extremity supported;Feet supported;No upper extremity supported Sitting balance-Leahy Scale: Poor Sitting balance - Comments: min to max A, posterior lean, trunk flexed position Postural control: Posterior lean     Standing balance comment: pt will not stand with PT and OT assisting                             Pertinent Vitals/Pain Pain Assessment: No/denies pain Faces Pain Scale: No hurt    Home Living Family/patient expects to be discharged to:: Skilled nursing facility                 Additional Comments: [place]    Prior Function Level of Independence: Needs assistance   Gait / Transfers Assistance Needed: From 05/21/2018 therapy note: Prior to initial admission 04/2018, pt was ambulating short distances with RW and supervision from aides. Since then, has been requiring assist to sit EOB; OOB with lift   ADL's / Homemaking Assistance Needed: Assist for all ADLs.pt's aide reports he is able to self-feed with handoverhand assistance prn  Immediately PTA, using bed pans  Comments: has comfort keepers 24/7; PLOF gathered from chart review of Feb admission  Hand Dominance   Dominant Hand: Right    Extremity/Trunk Assessment   Upper Extremity Assessment Upper Extremity Assessment: Generalized weakness;Difficult to assess due to impaired cognition    Lower Extremity Assessment Lower Extremity Assessment: Defer to PT evaluation    Cervical / Trunk Assessment Cervical / Trunk Assessment: Kyphotic  Communication   Communication: HOH;Expressive difficulties  Cognition Arousal/Alertness: Awake/alert Behavior During Therapy: Flat affect Overall Cognitive Status: History of cognitive impairments - at baseline                                  General Comments: advanced dementia      General Comments      Exercises     Assessment/Plan    PT Assessment Patient needs continued PT services  PT Problem List Decreased strength;Decreased range of motion;Decreased activity tolerance;Decreased balance;Decreased mobility;Decreased coordination;Decreased cognition;Decreased knowledge of use of DME;Decreased safety awareness;Cardiopulmonary status limiting activity;Decreased skin integrity       PT Treatment Interventions DME instruction;Functional mobility training;Therapeutic activities;Therapeutic exercise;Balance training;Neuromuscular re-education;Patient/family education    PT Goals (Current goals can be found in the Care Plan section)  Acute Rehab PT Goals Patient Stated Goal: none stated PT Goal Formulation: Patient unable to participate in goal setting Time For Goal Achievement: 09/16/18 Potential to Achieve Goals: Fair    Frequency Min 2X/week   Barriers to discharge Other (comment)(lives in SNF with full staffing)      Co-evaluation PT/OT/SLP Co-Evaluation/Treatment: Yes Reason for Co-Treatment: Necessary to address cognition/behavior during functional activity;For patient/therapist safety;To address functional/ADL transfers PT goals addressed during session: Mobility/safety with mobility;Balance OT goals addressed during session: ADL's and self-care;Strengthening/ROM       AM-PAC PT "6 Clicks" Mobility  Outcome Measure Help needed turning from your back to your side while in a flat bed without using bedrails?: A Lot Help needed moving from lying on your back to sitting on the side of a flat bed without using bedrails?: A Lot Help needed moving to and from a bed to a chair (including a wheelchair)?: A Lot Help needed standing up from a chair using your arms (e.g., wheelchair or bedside chair)?: Total Help needed to walk in hospital room?: Total Help needed climbing 3-5 steps with a railing? :  Total 6 Click Score: 9    End of Session Equipment Utilized During Treatment: Gait belt Activity Tolerance: Patient limited by fatigue;Patient limited by lethargy Patient left: in bed;with call bell/phone within reach;with bed alarm set Nurse Communication: Mobility status PT Visit Diagnosis: Other abnormalities of gait and mobility (R26.89);Muscle weakness (generalized) (M62.81);Adult, failure to thrive (R62.7)    Time: 2947-6546 PT Time Calculation (min) (ACUTE ONLY): 19 min   Charges:   PT Evaluation $PT Eval Moderate Complexity: 1 Mod          Ramond Dial 09/02/2018, 3:31 PM  Mee Hives, PT MS Acute Rehab Dept. Number: [place]

## 2018-09-02 NOTE — Evaluation (Signed)
Occupational Therapy Evaluation Patient Details Name: Jose [name] MRN: [id number] DOB: 04/18/1930 Today's Date: 09/02/2018    History of Present Illness 85 yo male with onset of low O2 sats and SOB with respiratory distress was admitted from [place]  for asp PNA.  Pt was in sepsis, with hypoxia and in acute CHF, already on ABT.  PMHx:  dementia, SNF resident, CHF, DM, CHF, HTN, PNA, FTT, incontinence, palliative care, GI bleed, anemia, actinic keratosis, depression, OAB, anxiety, L hip fracture, B THA's   Clinical Impression   Pt admitted with the above diagnoses and presents with below problem list. Pt will benefit from continued acute OT to address the below listed deficits and maximize independence with basic ADLs prior to d/c back to SNF. Per chart review of previous admission in February this year: pt has been needing assist with ADLs, lift equipment for transfer OOB. Pt is currently +2 mod assist for bed mobility. Pt able to sit for 10 minutes EOB with min to max A, posterior lean noted. Pt continues to need hoyer lift for transferring OOB.     Follow Up Recommendations  SNF    Equipment Recommendations  Other (comment)(defer to next venue)    Recommendations for Other Services       Precautions / Restrictions Precautions Precautions: Fall Restrictions Weight Bearing Restrictions: No      Mobility Bed Mobility Overal bed mobility: Needs Assistance Bed Mobility: Supine to Sit;Sit to Supine     Supine to sit: Total assist;+2 for physical assistance Sit to supine: Total assist;+2 for physical assistance      Transfers                 General transfer comment: NT    Balance Overall balance assessment: Needs assistance Sitting-balance support: Bilateral upper extremity supported;Feet supported;No upper extremity supported Sitting balance-Leahy Scale: Poor Sitting balance - Comments: min to max A, posterior lean, trunk flexed position Postural  control: Posterior lean                                 ADL either performed or assessed with clinical judgement   ADL Overall ADL's : Needs assistance/impaired Eating/Feeding: Maximal assistance;Bed level   Grooming: Total assistance;Bed level   Upper Body Bathing: Total assistance;Bed level;Sitting   Lower Body Bathing: Total assistance;Bed level   Upper Body Dressing : Total assistance;Sitting;Bed level   Lower Body Dressing: Total assistance;Bed level                 General ADL Comments: Able to sit EOB about 10 minutes with min to max A. Cues to come out of trunk flexed position which he could maintain at brief intervals.      Vision         Perception     Praxis      Pertinent Vitals/Pain Pain Assessment: No/denies pain Faces Pain Scale: No hurt     Hand Dominance Right   Extremity/Trunk Assessment Upper Extremity Assessment Upper Extremity Assessment: Generalized weakness;Difficult to assess due to impaired cognition   Lower Extremity Assessment Lower Extremity Assessment: Defer to PT evaluation   Cervical / Trunk Assessment Cervical / Trunk Assessment: Kyphotic   Communication Communication Communication: HOH;Expressive difficulties   Cognition Arousal/Alertness: Awake/alert Behavior During Therapy: Flat affect Overall Cognitive Status: History of cognitive impairments - at baseline  General Comments: advanced dementia   General Comments       Exercises     Shoulder Instructions      Home Living Family/patient expects to be discharged to:: Skilled nursing facility                                 Additional Comments: [place]      Prior Functioning/Environment Level of Independence: Needs assistance  Gait / Transfers Assistance Needed: From 05/21/2018 therapy note: Prior to initial admission 04/2018, pt was ambulating short distances with RW and  supervision from aides. Since then, has been requiring assist to sit EOB; OOB with lift  ADL's / Homemaking Assistance Needed: Assist for all ADLs.pt's aide reports he is able to self-feed with handoverhand assistance prn  Immediately PTA, using bed pans   Comments: has comfort keepers 24/7; PLOF gathered from chart review of Feb admission        OT Problem List: Decreased activity tolerance;Impaired balance (sitting and/or standing);Decreased strength;Decreased cognition;Decreased knowledge of use of DME or AE;Decreased knowledge of precautions;Cardiopulmonary status limiting activity      OT Treatment/Interventions: Self-care/ADL training;Therapeutic exercise;Energy conservation;DME and/or AE instruction;Therapeutic activities;Cognitive remediation/compensation;Patient/family education;Balance training    OT Goals(Current goals can be found in the care plan section) Acute Rehab OT Goals OT Goal Formulation: Patient unable to participate in goal setting Time For Goal Achievement: 09/16/18 Potential to Achieve Goals: Good ADL Goals Pt Will Perform Eating: (P) with mod assist;sitting;bed level Additional ADL Goal #1: (P) Pt will sit EOB for 10 minutes at min A level to facilitate EOB UB ADL tasks. Additional ADL Goal #2: (P) Pt will complete bed mobility at mod A level to prepare for EOB ADLs.  OT Frequency: Min 2X/week   Barriers to D/C:            Co-evaluation PT/OT/SLP Co-Evaluation/Treatment: Yes Reason for Co-Treatment: Necessary to address cognition/behavior during functional activity;For patient/therapist safety;To address functional/ADL transfers   OT goals addressed during session: ADL's and self-care;Strengthening/ROM      AM-PAC OT "6 Clicks" Daily Activity     Outcome Measure Help from another person eating meals?: A Lot Help from another person taking care of personal grooming?: Total Help from another person toileting, which includes using toliet, bedpan, or  urinal?: Total Help from another person bathing (including washing, rinsing, drying)?: Total Help from another person to put on and taking off regular upper body clothing?: Total Help from another person to put on and taking off regular lower body clothing?: Total 6 Click Score: 7   End of Session    Activity Tolerance: Patient tolerated treatment well Patient left: in bed;with call bell/phone within reach;with bed alarm set  OT Visit Diagnosis: Other symptoms and signs involving cognitive function;Muscle weakness (generalized) (M62.81);Other abnormalities of gait and mobility (R26.89)                Time: 5329-9242 OT Time Calculation (min): 19 min Charges:  OT General Charges $OT Visit: 1 Visit OT Evaluation $OT Eval Moderate Complexity: [place], OT Acute Rehabilitation Services Pager: [phone] Office: [phone]   Hortencia Pilar 09/02/2018, 2:05 PM

## 2018-09-02 NOTE — Progress Notes (Signed)
PROGRESS NOTE    Jose [name]  WSF:[id number] DOB: 04-20-1930 DOA: 08/30/2018 PCP: [name], MD   Brief Narrative:  HPI on 08/31/2018 by Dr. Jennette Kettle Jose [name] is a 85 y.o. male with medical history significant of advanced dementia, DM2, CHF, HTN. Patient with suspected aspiration syndrome, with 3 admissions earlier this year for recurrent PNAs.  As far as I am aware and can tell based on SNF provider notes as well as discharge summary from last admission, the patient is now DNR/DNI, but still do everything medically short of intubation to treat.  (Im unable to get a-hold of family members at this time despite calling all numbers in the chart).  Patient brought in to ED with respiratory distress.  From histroy in chart it seems he has been SOB for past couple of days.  O2 sat was 85% on 2L.  Interim history Admitted for hypoxic respiratory failure secondary to possible aspiration pneumonia.  Currently on antibiotics.  Question of CHF exacerbation and given IV Lasix. Assessment & Plan   Acute hypoxic respiratory failure -possibly due to aspiration pneumonioa vs CHF exacerbation -presented with oxygen saturation of 85% on 2L -COVID negative -Respiratory viral panel negative -Patient weaned off of oxygen.  Currently on room air and maintaining saturations in the  90s  Sepsis secondary to possible aspiration pneumonia  -On admission, patient febrile with leukocytosis, tachycardia, tachypnea-resolved -Chest x-ray showed small effusions and bibasilar airspace disease, atelectasis versus pneumonia -Patient given unasyn and vancomycin-we will transition to Augmentin today -lactic acid up to 3.5, procalcitonin 0.12- also improving- lactic acid down to 1.4 today -Strep pneumonia urine antigen negative -will hold off on giving IVF given CHF  Acute systolic congestive heart failure -Echocardiogram April 26, 2018 showed an EF of 35 to 40%.  Diffuse hypokinesis. -BNP of 1203.8  on admission -Was given 1 dose of IV Lasix 40 mg in the emergency department -Take and output, daily weights  Essential hypertension -BP meds currently held -BP stable  Diabetes mellitus, type II -Metformin held -Continue ISS and CBG monitoring  History of GI bleed -In Feb 2020 -monitor CBC- hemoglobin currently stable  Physical deconditioning -PT and OT consulted, currently pending  DVT Prophylaxis  SCDs  Code Status: DNR  Family Communication: none at bedside  Disposition Plan: Admitted. Pending improvement. dispo TBD- suspect SNF  Consultants None  Procedures  None  Antibiotics   Anti-infectives (From admission, onward)   Start     Dose/Rate Route Frequency Ordered Stop   09/01/18 0130  vancomycin (VANCOCIN) 1,500 mg in sodium chloride 0.9 % 500 mL IVPB     1,500 mg 250 mL/hr over 120 Minutes Intravenous Every 24 hours 08/31/18 0109     08/31/18 0200  Ampicillin-Sulbactam (UNASYN) 3 g in sodium chloride 0.9 % 100 mL IVPB     3 g 200 mL/hr over 30 Minutes Intravenous Every 8 hours 08/31/18 0147     08/31/18 0115  ceFEPIme (MAXIPIME) 2 g in sodium chloride 0.9 % 100 mL IVPB     2 g 200 mL/hr over 30 Minutes Intravenous  Once 08/31/18 0103 08/31/18 0218   08/31/18 0115  vancomycin (VANCOCIN) 1,500 mg in sodium chloride 0.9 % 500 mL IVPB     1,500 mg 250 mL/hr over 120 Minutes Intravenous  Once 08/31/18 0104 08/31/18 0341   08/31/18 0100  ceFEPIme (MAXIPIME) 1 g in sodium chloride 0.9 % 100 mL IVPB  Status:  Discontinued     1 g  200 mL/hr over 30 Minutes Intravenous  Once 08/31/18 0057 08/31/18 0103      Subjective:   Jose [name] seen and examined today.  Patient with dementia. Does not answer questions appropriately.   Objective:   Vitals:   09/02/18 0200 09/02/18 0354 09/02/18 0716 09/02/18 1200  BP: 113/67 (!) 143/98 135/62 121/70  Pulse: 88 95 99 95  Resp: (!) 23 (!) 38 (!) 23 (!) 25  Temp:  98.4 F (36.9 C) 97.7 F (36.5 C)   TempSrc:  Oral Oral    SpO2: 94% 97% 98% 100%  Weight:      Height:        Intake/Output Summary (Last 24 hours) at 09/02/2018 1310 Last data filed at 09/02/2018 1012 Gross per 24 hour  Intake 1239.92 ml  Output 580 ml  Net 659.92 ml   Filed Weights   08/30/18 2329 08/31/18 0258  Weight: 78.7 kg 79.7 kg   Exam  General: Well developed, chronically ill-appearing, NAD  HEENT: NCAT, mucous membranes moist.   Neck: Supple  Cardiovascular: S1 S2 auscultated, RRR  Respiratory: Clear to auscultation bilaterally   Abdomen: Soft, nontender, nondistended, + bowel sounds  Extremities: warm dry without cyanosis clubbing or edema  Neuro: AAOx1 (self only), patient with dementia   Data Reviewed: I have personally reviewed following labs and imaging studies  CBC: Recent Labs  Lab 08/30/18 2328 08/31/18 0547 09/01/18 0821  WBC 16.3* 11.6* 9.5  NEUTROABS 13.7*  --   --   HGB 14.3 12.6* 12.6*  HCT 44.6 38.9* 38.3*  MCV 94.3 93.1 93.4  PLT 170 127* 638*   Basic Metabolic Panel: Recent Labs  Lab 08/30/18 2328 08/31/18 0547 09/01/18 0821  NA 134* 134* 141  K 4.9 4.6 3.9  CL 100 99 106  CO2 20* 22 23  GLUCOSE 398* 354* 140*  BUN 23 25* 17  CREATININE 1.00 1.06 0.77  CALCIUM 8.9 8.8* 8.7*   GFR: Estimated Creatinine Clearance: 74.7 mL/min (by C-G formula based on SCr of 0.77 mg/dL). Liver Function Tests: No results for input(s): AST, ALT, ALKPHOS, BILITOT, PROT, ALBUMIN in the last 168 hours. No results for input(s): LIPASE, AMYLASE in the last 168 hours. No results for input(s): AMMONIA in the last 168 hours. Coagulation Profile: No results for input(s): INR, PROTIME in the last 168 hours. Cardiac Enzymes: Recent Labs  Lab 08/30/18 2328  TROPONINI 0.03*   BNP (last 3 results) No results for input(s): PROBNP in the last 8760 hours. HbA1C: No results for input(s): HGBA1C in the last 72 hours. CBG: Recent Labs  Lab 09/01/18 1955 09/01/18 2343 09/02/18 0351 09/02/18 0818  09/02/18 1225  GLUCAP 288* 187* 140* 177* 226*   Lipid Profile: No results for input(s): CHOL, HDL, LDLCALC, TRIG, CHOLHDL, LDLDIRECT in the last 72 hours. Thyroid Function Tests: No results for input(s): TSH, T4TOTAL, FREET4, T3FREE, THYROIDAB in the last 72 hours. Anemia Panel: No results for input(s): VITAMINB12, FOLATE, FERRITIN, TIBC, IRON, RETICCTPCT in the last 72 hours. Urine analysis:    Component Value Date/Time   COLORURINE YELLOW 05/01/2018 1109   APPEARANCEUR CLEAR 05/01/2018 1109   LABSPEC 1.011 05/01/2018 1109   PHURINE 6.0 05/01/2018 1109   GLUCOSEU NEGATIVE 05/01/2018 1109   HGBUR LARGE (A) 05/01/2018 1109   BILIRUBINUR NEGATIVE 05/01/2018 1109   [place] 05/01/2018 1109   PROTEINUR NEGATIVE 05/01/2018 1109   UROBILINOGEN 0.2 06/24/2014 2112   NITRITE NEGATIVE 05/01/2018 1109   LEUKOCYTESUR NEGATIVE 05/01/2018 1109   Sepsis Labs: @  LABRCNTIP(procalcitonin:4,lacticidven:4)  ) Recent Results (from the past 240 hour(s))  SARS Coronavirus 2 (CEPHEID- Performed in [place] hospital lab), Hosp Order     Status: None   Collection Time: 08/30/18 11:44 PM  Result Value Ref Range Status   SARS Coronavirus 2 NEGATIVE NEGATIVE Final    Comment: (NOTE) If result is NEGATIVE SARS-CoV-2 target nucleic acids are NOT DETECTED. The SARS-CoV-2 RNA is generally detectable in upper and lower  respiratory specimens during the acute phase of infection. The lowest  concentration of SARS-CoV-2 viral copies this assay can detect is 250  copies / mL. A negative result does not preclude SARS-CoV-2 infection  and should not be used as the sole basis for treatment or other  patient management decisions.  A negative result may occur with  improper specimen collection / handling, submission of specimen other  than nasopharyngeal swab, presence of viral mutation(s) within the  areas targeted by this assay, and inadequate number of viral copies  (<250 copies / mL). A  negative result must be combined with clinical  observations, patient history, and epidemiological information. If result is POSITIVE SARS-CoV-2 target nucleic acids are DETECTED. The SARS-CoV-2 RNA is generally detectable in upper and lower  respiratory specimens dur ing the acute phase of infection.  Positive  results are indicative of active infection with SARS-CoV-2.  Clinical  correlation with patient history and other diagnostic information is  necessary to determine patient infection status.  Positive results do  not rule out bacterial infection or co-infection with other viruses. If result is PRESUMPTIVE POSTIVE SARS-CoV-2 nucleic acids MAY BE PRESENT.   A presumptive positive result was obtained on the submitted specimen  and confirmed on repeat testing.  While 2019 novel coronavirus  (SARS-CoV-2) nucleic acids may be present in the submitted sample  additional confirmatory testing may be necessary for epidemiological  and / or clinical management purposes  to differentiate between  SARS-CoV-2 and other Sarbecovirus currently known to infect humans.  If clinically indicated additional testing with an alternate test  methodology [phone]) is advised. The SARS-CoV-2 RNA is generally  detectable in upper and lower respiratory sp ecimens during the acute  phase of infection. The expected result is Negative. Fact Sheet for Patients:  StrictlyIdeas.no Fact Sheet for Healthcare Providers: BankingDealers.co.za This test is not yet approved or cleared by the [place] FDA and has been authorized for detection and/or diagnosis of SARS-CoV-2 by FDA under an Emergency Use Authorization (EUA).  This EUA will remain in effect (meaning this test can be used) for the duration of the COVID-19 declaration under Section 564(b)(1) of the Act, 21 U.S.C. section 360bbb-3(b)(1), unless the authorization is terminated or revoked sooner. Performed  at [facility], [place] [address] [place], [place] [address]   Blood culture (routine x 2)     Status: None (Preliminary result)   Collection Time: 08/31/18  1:02 AM  Result Value Ref Range Status   Specimen Description BLOOD RIGHT HAND  Final   Special Requests   Final    BOTTLES DRAWN AEROBIC ONLY Blood Culture adequate volume   Culture   Final    NO GROWTH 2 DAYS Performed at [facility], [place] [address], [place], [place] [address]    Report Status PENDING  Incomplete  Blood culture (routine x 2)     Status: None (Preliminary result)   Collection Time: 08/31/18  1:10 AM  Result Value Ref Range Status   Specimen Description BLOOD RIGHT ARM  Final   Special Requests   Final    BOTTLES DRAWN AEROBIC AND ANAEROBIC Blood Culture results may not be optimal due to an excessive volume of blood received in culture bottles   Culture   Final    NO GROWTH 2 DAYS Performed at [facility], [place] [address], [place], [place] [address]    Report Status PENDING  Incomplete  MRSA PCR Screening     Status: Abnormal   Collection Time: 08/31/18  3:15 AM  Result Value Ref Range Status   MRSA by PCR POSITIVE (A) NEGATIVE Final    Comment:        The GeneXpert MRSA Assay (FDA approved for NASAL specimens only), is one component of a comprehensive MRSA colonization surveillance program. It is not intended to diagnose MRSA infection nor to guide or monitor treatment for MRSA infections. RESULT CALLED TO, READ BACK BY AND VERIFIED WITH: IRBY,T RN 0532 08/31/2018 MITCHELL,L Performed at [facility], [place] [address], [place], [place] [address]   Respiratory Panel by PCR     Status: None   Collection Time: 08/31/18  3:15 AM  Result Value Ref Range Status   Adenovirus NOT DETECTED NOT DETECTED Final   Coronavirus 229E NOT DETECTED NOT DETECTED Final    Comment: (NOTE) The Coronavirus on the Respiratory Panel, DOES NOT test for the novel  Coronavirus (2019 nCoV)     Coronavirus HKU1 NOT DETECTED NOT DETECTED Final   Coronavirus NL63 NOT DETECTED NOT DETECTED Final   Coronavirus OC43 NOT DETECTED NOT DETECTED Final   Metapneumovirus NOT DETECTED NOT DETECTED Final   Rhinovirus / Enterovirus NOT DETECTED NOT DETECTED Final   Influenza A NOT DETECTED NOT DETECTED Final   Influenza B NOT DETECTED NOT DETECTED Final   Parainfluenza Virus 1 NOT DETECTED NOT DETECTED Final   Parainfluenza Virus 2 NOT DETECTED NOT DETECTED Final   Parainfluenza Virus 3 NOT DETECTED NOT DETECTED Final   Parainfluenza Virus 4 NOT DETECTED NOT DETECTED Final   Respiratory Syncytial Virus NOT DETECTED NOT DETECTED Final   Bordetella pertussis NOT DETECTED NOT DETECTED Final   Chlamydophila pneumoniae NOT DETECTED NOT DETECTED Final   Mycoplasma pneumoniae NOT DETECTED NOT DETECTED Final    Comment: Performed at [facility], [place]. [address], [place], [place] [address]      Radiology Studies: No results found.   Scheduled Meds: . chlorhexidine  15 mL Mouth Rinse BID  . Chlorhexidine Gluconate Cloth  6 each Topical Q0600  . insulin aspart  0-9 Units Subcutaneous Q4H  . mouth rinse  15 mL Mouth Rinse q12n4p  . mupirocin ointment  1 application Nasal BID  . Resource ThickenUp Clear   Oral Once   Continuous Infusions: . ampicillin-sulbactam (UNASYN) IV 3 g (09/02/18 0907)  . vancomycin 1,500 mg (09/02/18 0122)     LOS: 2 days   Time Spent in minutes   30 minutes  Norman Piacentini D.O. on 09/02/2018 at 1:10 PM  Between 7am to 7pm - Please see pager noted on [email or web address]  After 7pm go to [email or web address]  And look for the night coverage person covering for me after hours  [facility] Office  [phone]

## 2018-09-02 NOTE — NC FL2 (Signed)
[place] LEVEL OF CARE SCREENING TOOL     IDENTIFICATION  Patient Name: ORIEN MAYHALL Birthdate: 1930-07-05 Sex: male Admission Date (Current Location): 08/30/2018  [facility] and [place] Number:  Herbalist and Address:  [facility] [place]. [facility], [place] [address] [place], [place] [address]      Provider Number: [id number]  Attending Physician Name and Address:  Cristal Ford, DO  Relative Name and Phone Number:       Current Level of Care: Hospital Recommended Level of Care: [place] Prior Approval Number:    Date Approved/Denied:   PASRR Number: [id number]  Discharge Plan: SNF    Current Diagnoses: Patient Active Problem List   Diagnosis Date Noted  . Adult failure to thrive   . Upper GI bleed   . Acute respiratory failure with hypoxia ([place])   . Palliative care encounter   . Goals of care, counseling/discussion   . Eye irritation 05/11/2018  . DNR (do not resuscitate) discussion   . Palliative care by specialist   . Aspiration pneumonia ([place]) 04/26/2018  . Constipation 04/15/2018  . HTN (hypertension) 04/15/2018  . Iron deficiency anemia 04/08/2018  . Actinic keratosis 06/21/2017  . Moderate protein-calorie malnutrition ([place]) 03/06/2017  . Unsteady gait 03/06/2017  . Urinary incontinence 12/31/2016  . Dysphagia 12/31/2016  . Dermatitis, seborrheic 12/14/2016  . Edema 12/14/2016  . Hyperlipidemia associated with type 2 diabetes mellitus ([place]) 12/06/2016  . Functional urinary incontinence 12/06/2016  . Chronic bilateral low back pain with bilateral sciatica 12/06/2016  . Chronic combined systolic and diastolic congestive heart failure ([place]) 12/06/2016  . Major depression, recurrent, chronic ([place]) 11/23/2016  . OAB (overactive bladder) 11/23/2016  . Dermatitis 08/23/2016  . External hemorrhoid 07/26/2016  . Depression with anxiety 07/09/2016  . Thrombocytopenia ([place]) 06/28/2016  . Closed left hip fracture,  sequela 06/22/2016  . Type 2 diabetes mellitus with diabetic chronic kidney disease ([place]) 06/22/2016  . Hyperlipidemia LDL goal <70 06/22/2016  . Alzheimer disease ([place]) 11/12/2012  . History of bilateral hip replacements 11/12/2012    Orientation RESPIRATION BLADDER Height & Weight     Self  Normal Incontinent, External catheter Weight: 175 lb 11.3 oz (79.7 kg) Height:  6\' 1"  (185.4 cm)  BEHAVIORAL SYMPTOMS/MOOD NEUROLOGICAL BOWEL NUTRITION STATUS  (None) (Alzheimer's Disease) Incontinent Diet(DYS 1. Fluid honey thick. Crush meds with puree; full supervision with meals.)  AMBULATORY STATUS COMMUNICATION OF NEEDS Skin   Total Care Verbally Skin abrasions, Other (Comment)(MASD.)                       Personal Care Assistance Level of Assistance  Total care, Bathing, Feeding, Dressing Bathing Assistance: Maximum assistance Feeding assistance: Maximum assistance Dressing Assistance: Maximum assistance Total Care Assistance: Maximum assistance   Functional Limitations Info  Sight, Hearing, Speech Sight Info: Adequate Hearing Info: Adequate Speech Info: Adequate    SPECIAL CARE FACTORS FREQUENCY  PT (By licensed PT), OT (By licensed OT), Speech therapy     PT Frequency: 5 x week OT Frequency: 5 x week     Speech Therapy Frequency: 5 x week      Contractures Contractures Info: Not present    Additional Factors Info  Code Status, Allergies Code Status Info: DNR Allergies Info: Axona (Bacid), Gabapentin.           Current Medications (09/02/2018):  This is the current hospital active medication list Current Facility-Administered Medications  Medication Dose Route Frequency Provider  Last Rate Last Dose  . acetaminophen (TYLENOL) tablet 650 mg  650 mg Oral Q6H PRN Etta Quill, DO       Or  . acetaminophen (TYLENOL) suppository 650 mg  650 mg Rectal Q6H PRN Etta Quill, DO      . amoxicillin-clavulanate (AUGMENTIN) 875-125 MG per tablet 1 tablet  1 tablet  Oral Q12H Mikhail, [place], DO   1 tablet at 09/02/18 1415  . chlorhexidine (PERIDEX) 0.12 % solution 15 mL  15 mL Mouth Rinse BID Cristal Ford, DO   15 mL at 09/02/18 0840  . Chlorhexidine Gluconate Cloth 2 % PADS 6 each  6 each Topical Q0600 Etta Quill, DO   6 each at 09/02/18 0424  . insulin aspart (novoLOG) injection 0-9 Units  0-9 Units Subcutaneous Q4H Jennette Kettle M, DO   3 Units at 09/02/18 1320  . MEDLINE mouth rinse  15 mL Mouth Rinse q12n4p Mikhail, [place], DO   15 mL at 09/02/18 1321  . mupirocin ointment (BACTROBAN) 2 % 1 application  1 application Nasal BID Etta Quill, DO   1 application at 58/83/25 [phone]  . ondansetron (ZOFRAN) tablet 4 mg  4 mg Oral Q6H PRN Etta Quill, DO       Or  . ondansetron [facility]) injection 4 mg  4 mg Intravenous Q6H PRN Etta Quill, DO      . Resource ThickenUp Clear   Oral Once Cristal Ford, DO         Discharge Medications: Please see discharge summary for a list of discharge medications.  Relevant Imaging Results:  Relevant Lab Results:   Additional Information SS#: [id number]  Candie Chroman, LCSW

## 2018-09-02 NOTE — Progress Notes (Signed)
  Speech Language Pathology Treatment: Dysphagia  Patient Details Name: Jose [name] MRN: [id number] DOB: 04/30/1930 Today's Date: 09/02/2018 Time: 9379-0240 SLP Time Calculation (min) (ACUTE ONLY): 12 min  Assessment / Plan / Recommendation Clinical Impression  Pt has occasional, delayed coughing across PO intake consisting of purees and honey thick liquids, which appears to be consistent with his baseline as described by his wife to SLP on previous date. Mod cues were provided to regulate bolus size and pacing to increase safety. Would continue current diet and precautions, still with need for full supervision to implement them,   HPI HPI: Pt is an 85 yo male admitted with concern for PNA after suspected aspiration event 2 days prior to admit when he was given meds while lying down. Per MD note, daughter says he has otherwise been doing well with dysphagia diet and honey thick liquids when upright. Pt had three admissions earlier this year for recurrent PNA. MBS most recently in January 2020 recommended thin liquids but with silent aspiration of larger boluses. He was downgraded clinically to Dys 1 diet/nectar thick liquids (at family request) in February 2020 due to coughing with intake and increased need for BiPAP. PMH also includes: dementia, DM2, CHF, HTN.      SLP Plan  Continue with current plan of care       Recommendations  Diet recommendations: Dysphagia 1 (puree);Honey-thick liquid Liquids provided via: Cup Medication Administration: Crushed with puree Supervision: Staff to assist with self feeding Compensations: Minimize environmental distractions;Slow rate;Small sips/bites Postural Changes and/or Swallow Maneuvers: Seated upright 90 degrees                Oral Care Recommendations: Oral care BID Follow up Recommendations: Skilled Nursing facility SLP Visit Diagnosis: Dysphagia, unspecified (R13.10) Plan: Continue with current plan of care       GO                 Venita Sheffield [name] 09/02/2018, 3:41 PM  Pollyann Glen, M.A. [place] Acute Environmental education officer [phone] Office [phone]

## 2018-09-02 NOTE — Progress Notes (Signed)
Inpatient Diabetes Program Recommendations  AACE/ADA: New Consensus Statement on Inpatient Glycemic Control (2015)  Target Ranges:  Prepandial:   less than 140 mg/dL      Peak postprandial:   less than 180 mg/dL (1-2 hours)      Critically ill patients:  140 - 180 mg/dL   Lab Results  Component Value Date   GLUCAP 226 (H) 09/02/2018   HGBA1C 6.2 06/23/2018    Results for Jose [name], Jose [name] (MRN [id number]) as of 09/02/2018 13:01  Ref. Range 09/01/2018 09:34 09/01/2018 12:25 09/01/2018 16:20 09/01/2018 19:55 09/01/2018 23:43 09/02/2018 03:51 09/02/2018 08:18 09/02/2018 12:25  Glucose-Capillary Latest Ref Range: 70 - 99 mg/dL 138 (H)  Novolog 1 unit 187 (H)  Novolog 2 units  191 (H)  Novolog 2 units 288 (H)  Novolog 5 units  187 (H) 140 (H)  Novolog 1 unit 177 (H)  Novolog 2 units  226 (H)      Review of Glycemic Control  Diabetes history: DM2 Outpatient Diabetes medications: Metformin 250 mg BID Current orders for Inpatient glycemic control: Novolog (0-9 units) Q4 hours   Patient's blood glucose increasing post prandially. Documented he is eating 50% - 100% of recent meals.  Recommend adding Novolog meal coverage.     MD please consider following insulin recommendations:  1. Add Novolog 3 units meal coverage tid (hold if NPO or eats< 50% of meal)  2. Decrease Novolog sensitive correction frequency to TID AC/HS as eating    Thank you.  -- Will follow during hospitalization.--  Jonna Clark RN, MSN Diabetes Coordinator Inpatient Glycemic Control Team Team Pager: [phone] (8am-5pm)

## 2018-09-03 ENCOUNTER — Inpatient Hospital Stay (HOSPITAL_COMMUNITY): Payer: Medicare Other

## 2018-09-03 LAB — GLUCOSE, CAPILLARY
Glucose-Capillary: 123 mg/dL — ABNORMAL HIGH (ref 70–99)
Glucose-Capillary: 147 mg/dL — ABNORMAL HIGH (ref 70–99)
Glucose-Capillary: 148 mg/dL — ABNORMAL HIGH (ref 70–99)
Glucose-Capillary: 178 mg/dL — ABNORMAL HIGH (ref 70–99)
Glucose-Capillary: 198 mg/dL — ABNORMAL HIGH (ref 70–99)
Glucose-Capillary: 241 mg/dL — ABNORMAL HIGH (ref 70–99)

## 2018-09-03 LAB — BASIC METABOLIC PANEL
Anion gap: 10 (ref 5–15)
BUN: 20 mg/dL (ref 8–23)
CO2: 24 mmol/L (ref 22–32)
Calcium: 9 mg/dL (ref 8.9–10.3)
Chloride: 110 mmol/L (ref 98–111)
Creatinine, Ser: 0.76 mg/dL (ref 0.61–1.24)
GFR calc Af Amer: >60 mL/min (ref >60)
GFR calc non Af Amer: >60 mL/min (ref >60)
Glucose, Bld: 196 mg/dL — ABNORMAL HIGH (ref 70–99)
Potassium: 3.5 mmol/L (ref 3.5–5.1)
Sodium: 144 mmol/L (ref 135–145)

## 2018-09-03 LAB — CBC
HCT: 41.7 % (ref 39.0–52.0)
Hemoglobin: 13.5 g/dL (ref 13.0–17.0)
MCH: 30.8 pg (ref 26.0–34.0)
MCHC: 32.4 g/dL (ref 30.0–36.0)
MCV: 95.2 fL (ref 80.0–100.0)
Platelets: 147 10*3/uL — ABNORMAL LOW (ref 150–400)
RBC: 4.38 MIL/uL (ref 4.22–5.81)
RDW: 14.4 % (ref 11.5–15.5)
WBC: 10.4 10*3/uL (ref 4.0–10.5)
nRBC: 0 % (ref 0.0–0.2)

## 2018-09-03 MED ORDER — FUROSEMIDE 20 MG PO TABS
20.0000 mg | ORAL_TABLET | Freq: Every day | ORAL | Status: DC
  Administered 2018-09-03: 20 mg via ORAL
  Filled 2018-09-03: qty 1

## 2018-09-03 MED ORDER — FUROSEMIDE 40 MG PO TABS
40.0000 mg | ORAL_TABLET | Freq: Every day | ORAL | Status: DC
  Administered 2018-09-04: 40 mg via ORAL
  Filled 2018-09-03: qty 1

## 2018-09-03 MED ORDER — BENZONATATE 100 MG PO CAPS
100.0000 mg | ORAL_CAPSULE | Freq: Three times a day (TID) | ORAL | Status: DC
  Administered 2018-09-03 – 2018-09-04 (×4): 100 mg via ORAL
  Filled 2018-09-03 (×5): qty 1

## 2018-09-03 MED ORDER — METOPROLOL TARTRATE 5 MG/5ML IV SOLN
5.0000 mg | Freq: Once | INTRAVENOUS | Status: AC
  Administered 2018-09-03: 5 mg via INTRAVENOUS
  Filled 2018-09-03: qty 5

## 2018-09-03 MED ORDER — FUROSEMIDE 10 MG/ML IJ SOLN
40.0000 mg | Freq: Once | INTRAMUSCULAR | Status: AC
  Administered 2018-09-03: 04:00:00 40 mg via INTRAVENOUS
  Filled 2018-09-03: qty 4

## 2018-09-03 MED ORDER — MORPHINE SULFATE (PF) 2 MG/ML IV SOLN
1.0000 mg | Freq: Once | INTRAVENOUS | Status: AC
  Administered 2018-09-03: 1 mg via INTRAVENOUS
  Filled 2018-09-03: qty 1

## 2018-09-03 MED ORDER — HYDRALAZINE HCL 20 MG/ML IJ SOLN: 5.0000 mg | Freq: Once | INTRAMUSCULAR | Status: DC

## 2018-09-03 MED ORDER — DM-GUAIFENESIN ER 30-600 MG PO TB12
1.0000 | ORAL_TABLET | Freq: Two times a day (BID) | ORAL | Status: DC
  Administered 2018-09-03 – 2018-09-04 (×3): 1 via ORAL
  Filled 2018-09-03 (×5): qty 1

## 2018-09-03 NOTE — Care Management Important Message (Signed)
Important Message  Patient Details  Name: Jose [name] MRN: [id number] Date of Birth: 09-11-1930   Medicare Important Message Given:  Yes    Kimi Kroft 09/03/2018, 1:41 PM

## 2018-09-03 NOTE — Progress Notes (Signed)
PROGRESS NOTE    Jose [name]  GUR:[id number] DOB: March 03, 1931 DOA: 08/30/2018 PCP: [name], MD   Brief Narrative:  HPI on 08/31/2018 by Dr. Jennette Kettle Jose [name] is a 85 y.o. male with medical history significant of advanced dementia, DM2, CHF, HTN. Patient with suspected aspiration syndrome, with 3 admissions earlier this year for recurrent PNAs.  As far as I am aware and can tell based on SNF provider notes as well as discharge summary from last admission, the patient is now DNR/DNI, but still do everything medically short of intubation to treat.  (Im unable to get a-hold of family members at this time despite calling all numbers in the chart).  Patient brought in to ED with respiratory distress.  From histroy in chart it seems he has been SOB for past couple of days.  O2 sat was 85% on 2L.  Interim history Overnight becomes hypoxic and short of breath. Also has orthopnea and PND. Required IV Lasix. Currently better. Only complaint is cough.  Assessment & Plan   Acute hypoxic respiratory failure -possibly due to aspiration pneumonioa vs CHF exacerbation -presented with oxygen saturation of 85% on 2L -COVID negative -Respiratory viral panel negative -Patient weaned off of oxygen.  Currently on room air and maintaining saturations in the  90s  Sepsis secondary to possible aspiration pneumonia  -On admission, patient febrile with leukocytosis, tachycardia, tachypnea-resolved -Chest x-ray showed small effusions and bibasilar airspace disease, atelectasis versus pneumonia -Patient given unasyn and vancomycin-we will transition to Augmentin today -lactic acid up to 3.5, procalcitonin 0.12- also improving- lactic acid down to 1.4 -Strep pneumonia urine antigen negative -will hold off on giving IVF given CHF  Acute systolic congestive heart failure -Echocardiogram April 26, 2018 showed an EF of 35 to 40%.  Diffuse hypokinesis. -BNP of 1203.8 on admission -Was given  1 dose of IV Lasix 40 mg in the emergency department, receiving daily Lasix 40 mg oral. -Take and output, daily weights, patient will require close skilled nursing facility evaluation due to the acute nature of CHF for short-term.  Essential hypertension -BP meds currently held -BP stable  Diabetes mellitus, type II, controlled without any chronic complication -Metformin held -Continue ISS and CBG monitoring  History of GI bleed -In Feb 2020 -monitor CBC- hemoglobin currently stable  Physical deconditioning -PT and OT consulted, recommend SNF  DVT Prophylaxis  SCDs  Code Status: DNR  Family Communication: none at bedside  Disposition Plan: Admitted. Pending improvement. dispo TBD- suspect SNF likely tomorrow if no acute events on 16 2020.  Consultants None  Procedures  None  Antibiotics   Anti-infectives (From admission, onward)   Start     Dose/Rate Route Frequency Ordered Stop   09/02/18 1315  amoxicillin-clavulanate (AUGMENTIN) 875-125 MG per tablet 1 tablet     1 tablet Oral Every 12 hours 09/02/18 1313     09/01/18 0130  vancomycin (VANCOCIN) 1,500 mg in sodium chloride 0.9 % 500 mL IVPB  Status:  Discontinued     1,500 mg 250 mL/hr over 120 Minutes Intravenous Every 24 hours 08/31/18 0109 09/02/18 1311   08/31/18 0200  Ampicillin-Sulbactam (UNASYN) 3 g in sodium chloride 0.9 % 100 mL IVPB  Status:  Discontinued     3 g 200 mL/hr over 30 Minutes Intravenous Every 8 hours 08/31/18 0147 09/02/18 1324   08/31/18 0115  ceFEPIme (MAXIPIME) 2 g in sodium chloride 0.9 % 100 mL IVPB     2 g 200 mL/hr over 30  Minutes Intravenous  Once 08/31/18 0103 08/31/18 0218   08/31/18 0115  vancomycin (VANCOCIN) 1,500 mg in sodium chloride 0.9 % 500 mL IVPB     1,500 mg 250 mL/hr over 120 Minutes Intravenous  Once 08/31/18 0104 08/31/18 0341   08/31/18 0100  ceFEPIme (MAXIPIME) 1 g in sodium chloride 0.9 % 100 mL IVPB  Status:  Discontinued     1 g 200 mL/hr over 30 Minutes  Intravenous  Once 08/31/18 0057 08/31/18 0103      Subjective:   Jose [name] seen and examined today.  Patient with dementia. Does not answer questions appropriately.   Objective:   Vitals:   09/03/18 0800 09/03/18 1156 09/03/18 1500 09/03/18 1630  BP: 133/71 (!) 145/97    Pulse: 88 100    Resp: 20 (!) 21 17   Temp: (!) 97.5 F (36.4 C)     TempSrc: Oral     SpO2: 96%   96%  Weight:      Height:        Intake/Output Summary (Last 24 hours) at 09/03/2018 1947 Last data filed at 09/03/2018 1500 Gross per 24 hour  Intake 118 ml  Output 1000 ml  Net -882 ml   Filed Weights   08/30/18 2329 08/31/18 0258  Weight: 78.7 kg 79.7 kg   Exam  General: Well developed, chronically ill-appearing, NAD  HEENT: NCAT, mucous membranes moist.   Neck: Supple  Cardiovascular: S1 S2 auscultated, RRR  Respiratory: Clear to auscultation bilaterally   Abdomen: Soft, nontender, nondistended, + bowel sounds  Extremities: warm dry without cyanosis clubbing or edema  Neuro: AAOx1 (self only), patient with dementia   Data Reviewed: I have personally reviewed following labs and imaging studies  CBC: Recent Labs  Lab 08/30/18 2328 08/31/18 0547 09/01/18 0821 09/03/18 0301  WBC 16.3* 11.6* 9.5 10.4  NEUTROABS 13.7*  --   --   --   HGB 14.3 12.6* 12.6* 13.5  HCT 44.6 38.9* 38.3* 41.7  MCV 94.3 93.1 93.4 95.2  PLT 170 127* 131* 485*   Basic Metabolic Panel: Recent Labs  Lab 08/30/18 2328 08/31/18 0547 09/01/18 0821 09/03/18 0301  NA 134* 134* 141 144  K 4.9 4.6 3.9 3.5  CL 100 99 106 110  CO2 20* 22 23 24   GLUCOSE 398* 354* 140* 196*  BUN 23 25* 17 20  CREATININE 1.00 1.06 0.77 0.76  CALCIUM 8.9 8.8* 8.7* 9.0   GFR: Estimated Creatinine Clearance: 74.7 mL/min (by C-G formula based on SCr of 0.76 mg/dL). Liver Function Tests: No results for input(s): AST, ALT, ALKPHOS, BILITOT, PROT, ALBUMIN in the last 168 hours. No results for input(s): LIPASE, AMYLASE in the last 168  hours. No results for input(s): AMMONIA in the last 168 hours. Coagulation Profile: No results for input(s): INR, PROTIME in the last 168 hours. Cardiac Enzymes: Recent Labs  Lab 08/30/18 2328  TROPONINI 0.03*   BNP (last 3 results) No results for input(s): PROBNP in the last 8760 hours. HbA1C: No results for input(s): HGBA1C in the last 72 hours. CBG: Recent Labs  Lab 09/03/18 0009 09/03/18 0324 09/03/18 0816 09/03/18 1158 09/03/18 1551  GLUCAP 123* 178* 147* 241* 148*   Lipid Profile: No results for input(s): CHOL, HDL, LDLCALC, TRIG, CHOLHDL, LDLDIRECT in the last 72 hours. Thyroid Function Tests: No results for input(s): TSH, T4TOTAL, FREET4, T3FREE, THYROIDAB in the last 72 hours. Anemia Panel: No results for input(s): VITAMINB12, FOLATE, FERRITIN, TIBC, IRON, RETICCTPCT in the last 72 hours.  Urine analysis:    Component Value Date/Time   COLORURINE YELLOW 05/01/2018 1109   APPEARANCEUR CLEAR 05/01/2018 1109   LABSPEC 1.011 05/01/2018 1109   PHURINE 6.0 05/01/2018 1109   GLUCOSEU NEGATIVE 05/01/2018 1109   HGBUR LARGE (A) 05/01/2018 1109   BILIRUBINUR NEGATIVE 05/01/2018 1109   [place] 05/01/2018 1109   PROTEINUR NEGATIVE 05/01/2018 1109   UROBILINOGEN 0.2 06/24/2014 2112   NITRITE NEGATIVE 05/01/2018 1109   LEUKOCYTESUR NEGATIVE 05/01/2018 1109   Sepsis Labs: @LABRCNTIP (procalcitonin:4,lacticidven:4)  ) Recent Results (from the past 240 hour(s))  SARS Coronavirus 2 (CEPHEID- Performed in [facility] hospital lab), Hosp Order     Status: None   Collection Time: 08/30/18 11:44 PM  Result Value Ref Range Status   SARS Coronavirus 2 NEGATIVE NEGATIVE Final    Comment: (NOTE) If result is NEGATIVE SARS-CoV-2 target nucleic acids are NOT DETECTED. The SARS-CoV-2 RNA is generally detectable in upper and lower  respiratory specimens during the acute phase of infection. The lowest  concentration of SARS-CoV-2 viral copies this assay can detect is  250  copies / mL. A negative result does not preclude SARS-CoV-2 infection  and should not be used as the sole basis for treatment or other  patient management decisions.  A negative result may occur with  improper specimen collection / handling, submission of specimen other  than nasopharyngeal swab, presence of viral mutation(s) within the  areas targeted by this assay, and inadequate number of viral copies  (<250 copies / mL). A negative result must be combined with clinical  observations, patient history, and epidemiological information. If result is POSITIVE SARS-CoV-2 target nucleic acids are DETECTED. The SARS-CoV-2 RNA is generally detectable in upper and lower  respiratory specimens dur ing the acute phase of infection.  Positive  results are indicative of active infection with SARS-CoV-2.  Clinical  correlation with patient history and other diagnostic information is  necessary to determine patient infection status.  Positive results do  not rule out bacterial infection or co-infection with other viruses. If result is PRESUMPTIVE POSTIVE SARS-CoV-2 nucleic acids MAY BE PRESENT.   A presumptive positive result was obtained on the submitted specimen  and confirmed on repeat testing.  While 2019 novel coronavirus  (SARS-CoV-2) nucleic acids may be present in the submitted sample  additional confirmatory testing may be necessary for epidemiological  and / or clinical management purposes  to differentiate between  SARS-CoV-2 and other [place]ovirus currently known to infect humans.  If clinically indicated additional testing with an alternate test  methodology [phone]) is advised. The SARS-CoV-2 RNA is generally  detectable in upper and lower respiratory sp ecimens during the acute  phase of infection. The expected result is Negative. Fact Sheet for Patients:  StrictlyIdeas.no Fact Sheet for Healthcare Providers:  BankingDealers.co.za This test is not yet approved or cleared by the [place] FDA and has been authorized for detection and/or diagnosis of SARS-CoV-2 by FDA under an Emergency Use Authorization (EUA).  This EUA will remain in effect (meaning this test can be used) for the duration of the COVID-19 declaration under Section 564(b)(1) of the Act, 21 U.S.C. section 360bbb-3(b)(1), unless the authorization is terminated or revoked sooner. Performed at [facility], [place] [address] [place], [place] [address]   Blood culture (routine x 2)     Status: None (Preliminary result)   Collection Time: 08/31/18  1:02 AM  Result Value Ref Range Status   Specimen Description BLOOD RIGHT HAND  Final  Special Requests   Final    BOTTLES DRAWN AEROBIC ONLY Blood Culture adequate volume   Culture   Final    NO GROWTH 3 DAYS Performed at [facility], [place] [address], [place], [place] [address]    Report Status PENDING  Incomplete  Blood culture (routine x 2)     Status: None (Preliminary result)   Collection Time: 08/31/18  1:10 AM  Result Value Ref Range Status   Specimen Description BLOOD RIGHT ARM  Final   Special Requests   Final    BOTTLES DRAWN AEROBIC AND ANAEROBIC Blood Culture results may not be optimal due to an excessive volume of blood received in culture bottles   Culture   Final    NO GROWTH 3 DAYS Performed at [facility], [place] [address], [place], [place] [address]    Report Status PENDING  Incomplete  MRSA PCR Screening     Status: Abnormal   Collection Time: 08/31/18  3:15 AM  Result Value Ref Range Status   MRSA by PCR POSITIVE (A) NEGATIVE Final    Comment:        The GeneXpert MRSA Assay (FDA approved for NASAL specimens only), is one component of a comprehensive MRSA colonization surveillance program. It is not intended to diagnose MRSA infection nor to guide or monitor treatment for MRSA infections. RESULT CALLED TO, READ  BACK BY AND VERIFIED WITH: IRBY,T RN 0532 08/31/2018 MITCHELL,L Performed at [facility], [place] [address] [place], [place] [address]   Respiratory Panel by PCR     Status: None   Collection Time: 08/31/18  3:15 AM  Result Value Ref Range Status   Adenovirus NOT DETECTED NOT DETECTED Final   Coronavirus 229E NOT DETECTED NOT DETECTED Final    Comment: (NOTE) The Coronavirus on the Respiratory Panel, DOES NOT test for the novel  Coronavirus (2019 nCoV)    Coronavirus HKU1 NOT DETECTED NOT DETECTED Final   Coronavirus NL63 NOT DETECTED NOT DETECTED Final   Coronavirus OC43 NOT DETECTED NOT DETECTED Final   Metapneumovirus NOT DETECTED NOT DETECTED Final   Rhinovirus / Enterovirus NOT DETECTED NOT DETECTED Final   Influenza A NOT DETECTED NOT DETECTED Final   Influenza B NOT DETECTED NOT DETECTED Final   Parainfluenza Virus 1 NOT DETECTED NOT DETECTED Final   Parainfluenza Virus 2 NOT DETECTED NOT DETECTED Final   Parainfluenza Virus 3 NOT DETECTED NOT DETECTED Final   Parainfluenza Virus 4 NOT DETECTED NOT DETECTED Final   Respiratory Syncytial Virus NOT DETECTED NOT DETECTED Final   Bordetella pertussis NOT DETECTED NOT DETECTED Final   Chlamydophila pneumoniae NOT DETECTED NOT DETECTED Final   Mycoplasma pneumoniae NOT DETECTED NOT DETECTED Final    Comment: Performed at [facility], [place]. [address] [place], [place] [address]      Radiology Studies: Dg Chest Port 1 View  Result Date: 09/03/2018 CLINICAL DATA:  Tachypnea.  Hypoxia. EXAM: PORTABLE CHEST 1 VIEW COMPARISON:  08/31/2018 FINDINGS: The lung volumes are low. There is no pneumothorax. Heart size is enlarged. There is elevation of the right hemidiaphragm. There is volume overload with developing pulmonary edema. There is atelectasis at the lung bases. There is no acute osseous abnormality. IMPRESSION: 1. Cardiomegaly with developing pulmonary edema. 2. Low lung volumes with persistent elevation of the right  hemidiaphragm. Electronically Signed   By: Constance Holster M.D.   On: 09/03/2018 03:54     Scheduled Meds: . amoxicillin-clavulanate  1  tablet Oral Q12H  . benzonatate  100 mg Oral TID  . chlorhexidine  15 mL Mouth Rinse BID  . Chlorhexidine Gluconate Cloth  6 each Topical Q0600  . dextromethorphan-guaiFENesin  1 tablet Oral BID  . [START ON 09/04/2018] furosemide  40 mg Oral Daily  . insulin aspart  0-9 Units Subcutaneous Q4H  . mouth rinse  15 mL Mouth Rinse q12n4p  . mupirocin ointment  1 application Nasal BID  . Resource ThickenUp Clear   Oral Once   Continuous Infusions:    LOS: 3 days   Time Spent in minutes   30 minutes  Author:  Berle Mull, MD [facility] 09/03/2018   To reach On-call, see care teams to locate the attending and reach out to them via [email or web address]. If 7PM-7AM, please contact night-coverage If you still have difficulty reaching the attending provider, please page the [facility] (Director on Call) for [facility] on amion for assistance.

## 2018-09-03 NOTE — Progress Notes (Signed)
Pt's heart rate jumped up to 120s and his breathing became labored.  Sounds of rhonchi in his lungs. Respirations were up to 35 - 40 bpm. The O2 sats were down in the 80s so he was put on 2 L O2 nasal cannula, which helped the O2 sats up to 94%.  His blood pressure was also 174/101. The rectal temp was 98.3 F, but still gave the pt a 650 mg rectal Tylenol. Dr. Baltazar Najjar was informed.  She ordered a STAT portable chest X-Ray, 5 mg Metoprolol IV, 40 mg Lasix IV and 1 mg Morphine IV. After the meds were given, pt's heart rate down to 85 and BP is 111/80. Pt breathing is no longer labored and Rhonchi sounds minimum in the lungs. Pt is resting comfortably.  Will continue to monitor.  Lupita Dawn, RN

## 2018-09-04 LAB — GLUCOSE, CAPILLARY
Glucose-Capillary: 138 mg/dL — ABNORMAL HIGH (ref 70–99)
Glucose-Capillary: 143 mg/dL — ABNORMAL HIGH (ref 70–99)
Glucose-Capillary: 145 mg/dL — ABNORMAL HIGH (ref 70–99)
Glucose-Capillary: 222 mg/dL — ABNORMAL HIGH (ref 70–99)
Glucose-Capillary: 249 mg/dL — ABNORMAL HIGH (ref 70–99)

## 2018-09-04 LAB — SARS CORONAVIRUS 2: SARS Coronavirus 2: NOT DETECTED

## 2018-09-04 MED ORDER — AMOXICILLIN-POT CLAVULANATE 875-125 MG PO TABS: 1.0000 | ORAL_TABLET | Freq: Two times a day (BID) | ORAL | 0 refills | Status: AC

## 2018-09-04 MED ORDER — DM-GUAIFENESIN ER 30-600 MG PO TB12: 1.0000 | ORAL_TABLET | Freq: Two times a day (BID) | ORAL | 0 refills | Status: DC

## 2018-09-04 MED ORDER — RESOURCE THICKENUP CLEAR PO POWD: 1.0000 g | Freq: Once | ORAL | 0 refills | Status: AC

## 2018-09-04 MED ORDER — BENZONATATE 100 MG PO CAPS: 100.0000 mg | ORAL_CAPSULE | Freq: Three times a day (TID) | ORAL | 0 refills | Status: DC

## 2018-09-04 MED ORDER — FUROSEMIDE 20 MG PO TABS: 20.0000 mg | ORAL_TABLET | Freq: Every day | ORAL | 1 refills | Status: DC

## 2018-09-04 NOTE — Progress Notes (Addendum)
Order received to discharge patient.  Telemetry monitor removed and CCMD notified.  [facility] called for transport.  Pt's wife made aware via phone that pt will be on his way home with questions answered and printed instructions sent home with him. Report called to receiving RN.

## 2018-09-04 NOTE — Discharge Summary (Signed)
[facility] Discharge Summary   Patient: Jose [name] TDV:[id number]   PCP: [name], MD DOB: 1930/09/16   Date of admission: 08/30/2018   Date of discharge:  09/04/2018    Discharge Diagnoses:  Principal Problem:   Aspiration pneumonia ([place]) Active Problems:   Alzheimer disease ([place])   Type 2 diabetes mellitus with diabetic chronic kidney disease ([place])   Chronic combined systolic and diastolic congestive heart failure (HCC)   HTN (hypertension)   Acute respiratory failure with hypoxia ([place])   Admitted From: SNF Disposition:  SNF with therapy  Recommendations for Outpatient Follow-up:  1. Please follow up with PCP and speech therapy  2. Please establish care with Palliative care  Follow-up Information    [name], MD. Schedule an appointment as soon as possible for a visit in 1 week(s).   Specialty:  Internal Medicine Contact information: [place] [phone]        palliative care. Schedule an appointment as soon as possible for a visit in 1 week(s).   Why:  at SNF         Diet recommendation: Dysphagia 1 (puree);Honey-thick liquid Liquids provided via: Cup Medication Administration: Crushed with puree Supervision: Staff to assist with self feeding Compensations: Minimize environmental distractions;Slow rate;Small sips/bites Postural Changes and/or Swallow Maneuvers: Seated upright 90 degrees  Activity: The patient is advised to gradually reintroduce usual activities.  Discharge Condition: good  Code Status: DNR DNI  History of present illness: As per the H and P dictated on admission, "STEVAN [name] is a 85 y.o. male with medical history significant of advanced dementia, DM2, CHF, HTN. Patient with suspected aspiration syndrome, with 3 admissions earlier this year for recurrent PNAs.  As far as I am aware and can tell based on SNF provider notes as well as discharge summary from last admission, the patient is  now DNR/DNI, but still do everything medically short of intubation to treat.  (Im unable to get a-hold of family members at this time despite calling all numbers in the chart).  Patient brought in to ED with respiratory distress.  From histroy in chart it seems he has been SOB for past couple of days.  O2 sat was 85% on 2L."  Hospital Course:  Summary of his active problems in the hospital is as following. Acute hypoxic respiratory failure -possibly due to aspiration pneumonioa vs CHF exacerbation -presented with oxygen saturation of 85% on 2L -COVID negative -Respiratory viral panel negative -Patient weaned off of oxygen.  Currently on room air and maintaining saturations in the  90s  Sepsis secondary to aspiration pneumonia  -On admission, patient febrile with leukocytosis, tachycardia, tachypnea-resolved -Chest x-ray showed small effusions and bibasilar airspace disease, atelectasis versus pneumonia -Patient given unasyn and vancomycin-to Augmentin- last of 7 day tomorrow -lactic acid up to 3.5, procalcitonin 0.12- also improving- lactic acid down to 1.4 -Strep pneumonia urine antigen negative  Acute systolic congestive heart failure -Echocardiogram April 26, 2018 showed an EF of 35 to 40%.  Diffuse hypokinesis. -BNP of 1203.8 on admission -Was given 1 dose of IV Lasix 40 mg in the emergency department, receiving daily Lasix 20 mg oral. -monitor intake and output, daily weights, patient will require close skilled nursing facility evaluation due to the acute nature of CHF for short-term.  Essential hypertension -BP meds currently held -BP stable  Diabetes mellitus, type II, controlled without any chronic complication -Metformin held resume  History of GI bleed -In Feb  2020 -monitor CBC- hemoglobin currently stable  Physical deconditioning -PT and OT consulted, recommend SNF  Goals of care Recommended outpatient Palliative care consultation at SNF.  Patient was  *seen by physical therapy, who recommended SNF, which was arranged by Education officer, museum. On the day of the discharge the patient's vitals were stable , and no other acute medical condition were reported by patient. the patient was felt safe to be discharge at SNF with therapy.  Consultants: none Procedures: none  DISCHARGE MEDICATION: Allergies as of 09/04/2018      Reactions   Axona [bacid] Other (See Comments)   Unknown per MAR   Gabapentin Other (See Comments)   Hallucinations      Medication List    TAKE these medications   albuterol (2.5 MG/3ML) 0.083% nebulizer solution Commonly known as:  PROVENTIL Take 3 mLs (2.5 mg total) by nebulization every 4 (four) hours as needed for wheezing or shortness of breath.   Align 4 MG Caps Take 4 mg by mouth daily.   amoxicillin-clavulanate 875-125 MG tablet Commonly known as:  AUGMENTIN Take 1 tablet by mouth 2 (two) times daily for 2 days.   benzonatate 100 MG capsule Commonly known as:  TESSALON Take 1 capsule (100 mg total) by mouth 3 (three) times daily.   buPROPion 100 MG 12 hr tablet Commonly known as:  WELLBUTRIN SR Take 100 mg by mouth daily.   carvedilol 3.125 MG tablet Commonly known as:  COREG Take 1 tablet (3.125 mg total) by mouth 2 (two) times daily with a meal.   dextromethorphan-guaiFENesin 30-600 MG 12hr tablet Commonly known as:  MUCINEX DM Take 1 tablet by mouth 2 (two) times daily.   docusate sodium 100 MG capsule Commonly known as:  COLACE Take 100 mg by mouth daily.   donepezil 10 MG tablet Commonly known as:  ARICEPT Take 10 mg by mouth daily. What changed:  Another medication with the same name was removed. Continue taking this medication, and follow the directions you see here.   dorzolamide-timolol 22.3-6.8 MG/ML ophthalmic solution Commonly known as:  COSOPT Place 1 drop into the left eye 2 (two) times daily.   ferrous sulfate 325 (65 FE) MG tablet Take 325 mg by mouth daily with breakfast.    Flaxseed Oil 1000 MG Caps Take 1,000 mg by mouth daily.   folic acid 734 MCG tablet Commonly known as:  FOLVITE Take 800 mcg by mouth every evening.   furosemide 20 MG tablet Commonly known as:  LASIX Take 1 tablet (20 mg total) by mouth daily. What changed:  how much to take   memantine 10 MG tablet Commonly known as:  NAMENDA Take 10 mg by mouth 2 (two) times daily. What changed:  Another medication with the same name was removed. Continue taking this medication, and follow the directions you see here.   metFORMIN 500 MG tablet Commonly known as:  GLUCOPHAGE Take 250 mg by mouth 2 (two) times daily with a meal.   multivitamin tablet Take 1 tablet by mouth daily.   OVER THE COUNTER MEDICATION Apply 41 % topically as needed. Aquaphor Healing(white petrolatum) ointment apply to dry or red skin   pantoprazole 40 MG tablet Commonly known as:  PROTONIX Take 1 tablet (40 mg total) by mouth daily.   Resource ThickenUp Clear Powd Take 1 g by mouth once for 1 dose.   simvastatin 5 MG tablet Commonly known as:  ZOCOR Take 5 mg by mouth at bedtime.   Vitamin D 1000  units capsule Take 1,000 Units by mouth daily.   zinc oxide 20 % ointment Apply 1 application topically as needed for irritation. To buttocks after every incontinent episode and as needed for redness      Allergies  Allergen Reactions  . Axona [Bacid] Other (See Comments)    Unknown per MAR  . Gabapentin Other (See Comments)    Hallucinations   Discharge Instructions    DIET - DYS 1   Complete by:  As directed    Fluid consistency:  Honey Thick   Discharge instructions   Complete by:  As directed    It is important that you read the given instructions as well as go over your medication list with RN to help you understand your care after this hospitalization.  Discharge Instructions: Please follow-up with PCP in 1-2 weeks  Please request your primary care physician to go over all Hospital Tests and  Procedure/Radiological results at the follow up. Please get all Hospital records sent to your PCP by signing hospital release before you go home.   Do not take more than prescribed Pain, Sleep and Anxiety Medications. You were cared for by a hospitalist during your hospital stay. If you have any questions about your discharge medications or the care you received while you were in the hospital after you are discharged, you can call the unit @UNIT @ you were admitted to and ask to speak with the hospitalist on call if the hospitalist that took care of you is not available.  Once you are discharged, your primary care physician will handle any further medical issues. Please note that NO REFILLS for any discharge medications will be authorized once you are discharged, as it is imperative that you return to your primary care physician (or establish a relationship with a primary care physician if you do not have one) for your aftercare needs so that they can reassess your need for medications and monitor your lab values. You Must read complete instructions/literature along with all the possible adverse reactions/side effects for all the Medicines you take and that have been prescribed to you. Take any new Medicines after you have completely understood and accept all the possible adverse reactions/side effects.   Increase activity slowly   Complete by:  As directed      Discharge Exam: Filed Weights   08/30/18 2329 08/31/18 0258  Weight: 78.7 kg 79.7 kg   Vitals:   09/04/18 0401 09/04/18 0800  BP: (!) 148/96 (!) 143/102  Pulse:  97  Resp: (!) 25 20  Temp:  (!) 97.5 F (36.4 C)  SpO2:  99%   General: Appear in mild distress, no Rash; Oral Mucosa Clear, moist. no Abnormal Mass Or lumps Cardiovascular: S1 and S2 Present, no Murmur, Respiratory: normal respiratory effort, Bilateral Air entry present and faint bilateral basal Crackles, no wheezes Abdomen: Bowel Sound present, Soft and no tenderness,  no hernia Extremities: no Pedal edema, no calf tenderness Neurology: alert and oriented to place and person affect appropriate. normal without focal findings, PERLA, Motor strength 5/5 and symmetric and sensation grossly normal to light touch   The results of significant diagnostics from this hospitalization (including imaging, microbiology, ancillary and laboratory) are listed below for reference.    Significant Diagnostic Studies: Dg Chest Port 1 View  Result Date: 09/03/2018 CLINICAL DATA:  Tachypnea.  Hypoxia. EXAM: PORTABLE CHEST 1 VIEW COMPARISON:  08/31/2018 FINDINGS: The lung volumes are low. There is no pneumothorax. Heart size is enlarged. There is elevation  of the right hemidiaphragm. There is volume overload with developing pulmonary edema. There is atelectasis at the lung bases. There is no acute osseous abnormality. IMPRESSION: 1. Cardiomegaly with developing pulmonary edema. 2. Low lung volumes with persistent elevation of the right hemidiaphragm. Electronically Signed   By: Constance Holster M.D.   On: 09/03/2018 03:54   Dg Chest Portable 1 View  Result Date: 08/31/2018 CLINICAL DATA:  Shortness of breath EXAM: PORTABLE CHEST 1 VIEW COMPARISON:  05/21/2018 FINDINGS: Low lung volumes. Probable small effusions. Bibasilar airspace disease. Stable cardiomediastinal silhouette. No pneumothorax. IMPRESSION: Low lung volumes with suspected small effusions and bibasilar airspace disease, atelectasis versus pneumonia. Electronically Signed   By: Donavan Foil M.D.   On: 08/31/2018 00:35    Microbiology: Recent Results (from the past 240 hour(s))  SARS Coronavirus 2 (CEPHEID- Performed in [place] hospital lab), Hosp Order     Status: None   Collection Time: 08/30/18 11:44 PM  Result Value Ref Range Status   SARS Coronavirus 2 NEGATIVE NEGATIVE Final    Comment: (NOTE) If result is NEGATIVE SARS-CoV-2 target nucleic acids are NOT DETECTED. The SARS-CoV-2 RNA is generally detectable  in upper and lower  respiratory specimens during the acute phase of infection. The lowest  concentration of SARS-CoV-2 viral copies this assay can detect is 250  copies / mL. A negative result does not preclude SARS-CoV-2 infection  and should not be used as the sole basis for treatment or other  patient management decisions.  A negative result may occur with  improper specimen collection / handling, submission of specimen other  than nasopharyngeal swab, presence of viral mutation(s) within the  areas targeted by this assay, and inadequate number of viral copies  (<250 copies / mL). A negative result must be combined with clinical  observations, patient history, and epidemiological information. If result is POSITIVE SARS-CoV-2 target nucleic acids are DETECTED. The SARS-CoV-2 RNA is generally detectable in upper and lower  respiratory specimens dur ing the acute phase of infection.  Positive  results are indicative of active infection with SARS-CoV-2.  Clinical  correlation with patient history and other diagnostic information is  necessary to determine patient infection status.  Positive results do  not rule out bacterial infection or co-infection with other viruses. If result is PRESUMPTIVE POSTIVE SARS-CoV-2 nucleic acids MAY BE PRESENT.   A presumptive positive result was obtained on the submitted specimen  and confirmed on repeat testing.  While 2019 novel coronavirus  (SARS-CoV-2) nucleic acids may be present in the submitted sample  additional confirmatory testing may be necessary for epidemiological  and / or clinical management purposes  to differentiate between  SARS-CoV-2 and other Sarbecovirus currently known to infect humans.  If clinically indicated additional testing with an alternate test  methodology [phone]) is advised. The SARS-CoV-2 RNA is generally  detectable in upper and lower respiratory sp ecimens during the acute  phase of infection. The expected result is  Negative. Fact Sheet for Patients:  StrictlyIdeas.no Fact Sheet for Healthcare Providers: BankingDealers.co.za This test is not yet approved or cleared by the [place] FDA and has been authorized for detection and/or diagnosis of SARS-CoV-2 by FDA under an Emergency Use Authorization (EUA).  This EUA will remain in effect (meaning this test can be used) for the duration of the COVID-19 declaration under Section 564(b)(1) of the Act, 21 U.S.C. section 360bbb-3(b)(1), unless the authorization is terminated or revoked sooner. Performed at [facility], [place],  [place] [address]   Blood culture (routine x 2)     Status: None (Preliminary result)   Collection Time: 08/31/18  1:02 AM  Result Value Ref Range Status   Specimen Description BLOOD RIGHT HAND  Final   Special Requests   Final    BOTTLES DRAWN AEROBIC ONLY Blood Culture adequate volume   Culture   Final    NO GROWTH 4 DAYS Performed at [facility], [place] [address], [place], [place] [address]    Report Status PENDING  Incomplete  Blood culture (routine x 2)     Status: None (Preliminary result)   Collection Time: 08/31/18  1:10 AM  Result Value Ref Range Status   Specimen Description BLOOD RIGHT ARM  Final   Special Requests   Final    BOTTLES DRAWN AEROBIC AND ANAEROBIC Blood Culture results may not be optimal due to an excessive volume of blood received in culture bottles   Culture   Final    NO GROWTH 4 DAYS Performed at [facility], [place] [address], [place], [place] [address]    Report Status PENDING  Incomplete  MRSA PCR Screening     Status: Abnormal   Collection Time: 08/31/18  3:15 AM  Result Value Ref Range Status   MRSA by PCR POSITIVE (A) NEGATIVE Final    Comment:        The GeneXpert MRSA Assay (FDA approved for NASAL specimens only), is one component of a comprehensive MRSA colonization surveillance program. It is not  intended to diagnose MRSA infection nor to guide or monitor treatment for MRSA infections. RESULT CALLED TO, READ BACK BY AND VERIFIED WITH: IRBY,T RN 0532 08/31/2018 MITCHELL,L Performed at [facility], [place] [address] [place], [place] [address]   Respiratory Panel by PCR     Status: None   Collection Time: 08/31/18  3:15 AM  Result Value Ref Range Status   Adenovirus NOT DETECTED NOT DETECTED Final   Coronavirus 229E NOT DETECTED NOT DETECTED Final    Comment: (NOTE) The Coronavirus on the Respiratory Panel, DOES NOT test for the novel  Coronavirus (2019 nCoV)    Coronavirus HKU1 NOT DETECTED NOT DETECTED Final   Coronavirus NL63 NOT DETECTED NOT DETECTED Final   Coronavirus OC43 NOT DETECTED NOT DETECTED Final   Metapneumovirus NOT DETECTED NOT DETECTED Final   Rhinovirus / Enterovirus NOT DETECTED NOT DETECTED Final   Influenza A NOT DETECTED NOT DETECTED Final   Influenza B NOT DETECTED NOT DETECTED Final   Parainfluenza Virus 1 NOT DETECTED NOT DETECTED Final   Parainfluenza Virus 2 NOT DETECTED NOT DETECTED Final   Parainfluenza Virus 3 NOT DETECTED NOT DETECTED Final   Parainfluenza Virus 4 NOT DETECTED NOT DETECTED Final   Respiratory Syncytial Virus NOT DETECTED NOT DETECTED Final   Bordetella pertussis NOT DETECTED NOT DETECTED Final   Chlamydophila pneumoniae NOT DETECTED NOT DETECTED Final   Mycoplasma pneumoniae NOT DETECTED NOT DETECTED Final    Comment: Performed at [facility], [place]. [address] [place], [place] [address]     Labs: CBC: Recent Labs  Lab 08/30/18 2328 08/31/18 0547 09/01/18 0821 09/03/18 0301  WBC 16.3* 11.6* 9.5 10.4  NEUTROABS 13.7*  --   --   --   HGB 14.3 12.6* 12.6* 13.5  HCT 44.6 38.9* 38.3* 41.7  MCV 94.3 93.1 93.4 95.2  PLT 170 127* 131* 791*   Basic Metabolic Panel: Recent Labs  Lab 08/30/18 2328 08/31/18 0547  09/01/18 0821 09/03/18 0301  NA 134* 134* 141 144  K 4.9 4.6 3.9 3.5  CL 100 99 106 110  CO2  20* 22 23 24   GLUCOSE 398* 354* 140* 196*  BUN 23 25* 17 20  CREATININE 1.00 1.06 0.77 0.76  CALCIUM 8.9 8.8* 8.7* 9.0   Liver Function Tests: No results for input(s): AST, ALT, ALKPHOS, BILITOT, PROT, ALBUMIN in the last 168 hours. No results for input(s): LIPASE, AMYLASE in the last 168 hours. No results for input(s): AMMONIA in the last 168 hours. Cardiac Enzymes: Recent Labs  Lab 08/30/18 2328  TROPONINI 0.03*   BNP (last 3 results) Recent Labs    05/01/18 0034 05/11/18 1550 08/30/18 2328  BNP 1,122.5* 448.5* 1,203.8*   CBG: Recent Labs  Lab 09/03/18 1551 09/03/18 2031 09/04/18 0002 09/04/18 0347 09/04/18 0824  GLUCAP 148* 198* 138* 143* 145*   Time spent: 35 minutes  Signed:  Berle Mull  [facility]  09/04/2018

## 2018-09-04 NOTE — TOC Progression Note (Addendum)
Transition of Care [facility]) - Progression Note    Patient Details  Name: KALDEN WANKE MRN: [id number] Date of Birth: 1930-07-19  Transition [facility]) CM/SW [place], [place] Phone Number: 09/04/2018, 2:48 PM  Clinical Narrative:    MD completed Peer to Peer on 09/03/2018. CSW was informed by the patient's insurance, patient was denied SNF authorization for rehab. CSW updated SNF, MD, RN and RNCM.    Patient tested negative for Covid-19 on 08/30/2018- SNF requesting another Covid-19 test prior to returning back to SNF. CSW requested Covid test today.  CSW had updated SNF and advise per MD patient is medically stable and can discharge if negative Covid test is received.  Thurmond Butts, MSW, LCSWA Clinical Social Worker [phone]   Barriers to Discharge: Continued Medical Work up  Expected Discharge Plan and Services           Expected Discharge Date: 09/04/18                                     Social Determinants of Health (SDOH) Interventions    Readmission Risk Interventions Readmission Risk Prevention Plan 09/01/2018  Transportation Screening Complete  PCP or Specialist Appt within 3-5 Days Complete  HRI or [place] Complete  Social Work Consult for [place] Planning/Counseling Complete  Palliative Care Screening Not Applicable  Medication Review Press photographer) Complete  Some recent data might be hidden

## 2018-09-04 NOTE — TOC Transition Note (Signed)
Transition of Care [facility]) - CM/SW Discharge Note   Patient Details  Name: PARDEEP PAUTZ MRN: [id number] Date of Birth: Nov 05, 1930  Transition of Care [facility]) CM/SW Contact:  Vinie Sill, [place] Phone Number: 09/04/2018, 5:06 PM   Clinical Narrative:     Patient will DC to: [place] Date: 09/04/2018 Family Notified:Jean, spouse  Transport By: Corey Harold  RN, patient, and facility notified of DC. Discharge Summary sent to facility. RN given number for report [phone] ext [phone] or [phone] and ask for Mr. Krapf nurse.  DC packet on chart. Ambulance transport requested for patient.   Clinical Social Worker signing off.  Thurmond Butts, MSW, [facility] Clinical Social Worker [phone]    Final next level of care: Skilled Nursing Facility Barriers to Discharge: Barriers Resolved   Patient Goals and CMS Choice Patient states their goals for this hospitalization and ongoing recovery are:: to return back home- patient is from SNF      Discharge Placement              Patient chooses bed at: ([facility] ) Patient to be transferred to facility by: [place] Name of family member notified: [name], spouse Patient and family notified of of transfer: 09/04/18  Discharge Plan and Services                                     Social Determinants of Health (SDOH) Interventions     Readmission Risk Interventions Readmission Risk Prevention Plan 09/01/2018  Transportation Screening Complete  PCP or Specialist Appt within 3-5 Days Complete  HRI or Home Care Consult Complete  Social Work Consult for [place] Planning/Counseling Complete  Palliative Care Screening Not Applicable  Medication Review Press photographer) Complete  Some recent data might be hidden

## 2018-09-04 NOTE — Plan of Care (Signed)

## 2018-09-04 NOTE — Progress Notes (Signed)
  Speech Language Pathology Treatment: Dysphagia  Patient Details Name: Jose [name] MRN: [id number] DOB: May 24, 1930 Today's Date: 09/04/2018 Time: 1884-1660 SLP Time Calculation (min) (ACUTE ONLY): 9 min  Assessment / Plan / Recommendation Clinical Impression  Pt at baseline with regard to swallowing.  He is tolerating a dysphagia 1 diet with honey thick liquids with no overt s/s of aspiration.  He continues to require assistance with feeding and verbal prompts to swallow, otherwise he holds solids in his mouth indefinitely.  Pt states he feels "lousy" today, but unable to provide further detail due to dementia.  Repositioned to help with comfort.  No further SLP needs here - pt for D/C back to SNF.  Dysphagia is chronic and is not likely to improve - spoke with his wife earlier in the week to reiterate the swallowing problems associated with dementia.  Our service will sign off at this time.   HPI HPI: Pt is an 85 yo male admitted with concern for PNA after suspected aspiration event 2 days prior to admit when he was given meds while lying down. Per MD note, daughter says he has otherwise been doing well with dysphagia diet and honey thick liquids when upright. Pt had three admissions earlier this year for recurrent PNA. MBS most recently in January 2020 recommended thin liquids but with silent aspiration of larger boluses. He was downgraded clinically to Dys 1 diet/nectar thick liquids (at family request) in February 2020 due to coughing with intake and increased need for BiPAP. PMH also includes: dementia, DM2, CHF, HTN.      SLP Plan  All goals met       Recommendations  Diet recommendations: Dysphagia 1 (puree);Honey-thick liquid Liquids provided via: Cup Medication Administration: Crushed with puree Supervision: Staff to assist with self feeding Compensations: Minimize environmental distractions;Slow rate;Small sips/bites                Oral Care Recommendations: Oral care  BID Follow up Recommendations: Skilled Nursing facility SLP Visit Diagnosis: Dysphagia, unspecified (R13.10) Plan: All goals met       GO               L. Tivis Ringer, [place] Office number [phone] Pager [phone]   [name] 09/04/2018, 11:34 AM

## 2018-09-05 ENCOUNTER — Non-Acute Institutional Stay (SKILLED_NURSING_FACILITY): Payer: Medicare Other | Admitting: Internal Medicine

## 2018-09-05 ENCOUNTER — Encounter: Payer: Self-pay | Admitting: Internal Medicine

## 2018-09-05 DIAGNOSIS — G301 Alzheimer's disease with late onset: Secondary | ICD-10-CM | POA: Diagnosis not present

## 2018-09-05 DIAGNOSIS — E1122 Type 2 diabetes mellitus with diabetic chronic kidney disease: Secondary | ICD-10-CM | POA: Diagnosis not present

## 2018-09-05 DIAGNOSIS — E785 Hyperlipidemia, unspecified: Secondary | ICD-10-CM

## 2018-09-05 DIAGNOSIS — J690 Pneumonitis due to inhalation of food and vomit: Secondary | ICD-10-CM | POA: Diagnosis not present

## 2018-09-05 DIAGNOSIS — D509 Iron deficiency anemia, unspecified: Secondary | ICD-10-CM

## 2018-09-05 DIAGNOSIS — I5042 Chronic combined systolic (congestive) and diastolic (congestive) heart failure: Secondary | ICD-10-CM | POA: Diagnosis not present

## 2018-09-05 DIAGNOSIS — F339 Major depressive disorder, recurrent, unspecified: Secondary | ICD-10-CM

## 2018-09-05 DIAGNOSIS — F0280 Dementia in other diseases classified elsewhere without behavioral disturbance: Secondary | ICD-10-CM

## 2018-09-05 DIAGNOSIS — I10 Essential (primary) hypertension: Secondary | ICD-10-CM

## 2018-09-05 LAB — CULTURE, BLOOD (ROUTINE X 2)
Culture: NO GROWTH
Culture: NO GROWTH
Special Requests: ADEQUATE

## 2018-09-05 NOTE — Progress Notes (Addendum)
Location:  [place] Room Number: [place] of Service:  SNF [phone]) Provider:  Veleta Miners, MD  [name], MD  Patient Care Team: [name], MD as PCP - General (Internal Medicine) [name], MD as PCP - Cardiology (Cardiology) Mast, Man X, NP as Nurse Practitioner (Internal Medicine) [name], [name], NP as Nurse Practitioner (Family Medicine)  Extended Emergency Contact Information Primary Emergency Contact: [name] Address: [place], [place] Phone: [phone] Mobile Phone: [phone] Relation: Spouse Secondary Emergency Contact: [name] [place] Phone: [phone] Work Phone: [phone] Relation: Daughter  Code Status:  FULL Goals of care: Advanced Directive information Advanced Directives 09/05/2018  Does Patient Have a Medical Advance Directive? Yes  Type of Paramedic of [place];Living will  Does patient want to make changes to medical advance directive? No - Patient declined  Copy of [place] in Chart? Yes - validated most recent copy scanned in chart (See row information)  Would patient like information on creating a medical advance directive? -  Pre-existing out of facility DNR order (yellow form or pink MOST form) -     Chief Complaint  Patient presents with  . New Admit To SNF    Admit to Facility     HPI:  Pt is a 85 y.o. male seen today for Readmit to facility Patient was in the hospital from 5/30-6/04 For Aspiration pneumonia with Hypoxia and sepsis He has h/o Recurrent Aspiration Pneumonia due to Dysphagia. He also has h/o Type 2 diabetes,Advanceddementia, hypertension, systolic CHF EF of 40-81 %, depression, anemia, due to GI Bleed Patient is long term resident of facility. He was send to the ED for Respiratory Distress In the hospital he was found to have  Aspiration  Pneumonia. He was treated with Antibiotics. There was some concern for CHF and he was treated with Increased dose of Lasix. He was discharged on Augmentin. Patient is doing well. Per Nurses No cough or SOB No fever Eating well Patient unable to give any history due to his Dementia     Past Medical History:  Diagnosis Date  . Abnormality of gait   . Backache, unspecified   . CHF (congestive heart failure) ([place])   . Colon polyps    adenomatous  . Dementia ([place]) 11/12/2012  . Depression   . Diabetes mellitus without complication ([place])   . Diverticulosis   . Essential and other specified forms of tremor   . Hemorrhoids   . History of bilateral hip replacements 11/12/2012  . Memory loss   . 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 Pain in joint, pelvic region and thigh   . Skin cancer of scalp   . Spinal stenosis, lumbar region, without neurogenic claudication   . Unspecified hereditary and idiopathic peripheral neuropathy    Past Surgical History:  Procedure Laterality Date  . CATARACT EXTRACTION, BILATERAL  2012  . JOINT REPLACEMENT    . RETINAL LASER PROCEDURE  2012   retinal wrinkle  . SKIN CANCER EXCISION    . TOTAL HIP ARTHROPLASTY Left 2000  . TOTAL HIP ARTHROPLASTY (aka REPLACEMENT) Right 2011    Allergies  Allergen Reactions  . Axona [Bacid] Other (See Comments)    Unknown per MAR  . Gabapentin Other (See Comments)    Hallucinations    Outpatient Encounter Medications  as of 09/05/2018  Medication Sig  . albuterol (PROVENTIL) (2.5 MG/3ML) 0.083% nebulizer solution Take 3 mLs (2.5 mg total) by nebulization every 4 (four) hours as needed for wheezing or shortness of breath.  Mar[place] Kitchen amoxicillin-clavulanate (AUGMENTIN) 875-125 MG tablet Take 1 tablet by mouth 2 (two) times daily for 2 days.  . benzonatate (TESSALON) 100 MG capsule Take 1 capsule (100 mg total) by mouth 3 (three) times daily.  Marland Kitchen buPROPion (WELLBUTRIN SR) 100 MG 12 hr  tablet Take 100 mg by mouth daily.  . carvedilol (COREG) 3.125 MG tablet Take 1 tablet (3.125 mg total) by mouth 2 (two) times daily with a meal.  . Cholecalciferol (VITAMIN D) 1000 UNITS capsule Take 1,000 Units by mouth daily.    Marland Kitchen dextromethorphan-guaiFENesin (MUCINEX DM) 30-600 MG 12hr tablet Take 1 tablet by mouth 2 (two) times daily.  Marland Kitchen docusate sodium (COLACE) 100 MG capsule Take 100 mg by mouth daily.  Marland Kitchen donepezil (ARICEPT) 10 MG tablet Take 10 mg by mouth daily.  . dorzolamide-timolol (COSOPT) 22.3-6.8 MG/ML ophthalmic solution Place 1 drop into the left eye 2 (two) times daily.   . ferrous sulfate 325 (65 FE) MG tablet Take 325 mg by mouth daily with breakfast.  . Flaxseed, Linseed, (FLAXSEED OIL) 1000 MG CAPS Take 1,000 mg by mouth daily.    . folic acid (FOLVITE) 425 MCG tablet Take 800 mcg by mouth every evening.   . furosemide (LASIX) 20 MG tablet Take 1 tablet (20 mg total) by mouth daily.  . memantine (NAMENDA) 10 MG tablet Take 10 mg by mouth 2 (two) times daily.  . metFORMIN (GLUCOPHAGE) 500 MG tablet Take 250 mg by mouth 2 (two) times daily with a meal.   . Multiple Vitamin (MULTIVITAMIN) tablet Take 1 tablet by mouth daily.    Marland Kitchen OVER THE COUNTER MEDICATION Apply 41 % topically as needed. Aquaphor Healing(white petrolatum) ointment apply to dry or red skin  . pantoprazole (PROTONIX) 40 MG tablet Take 1 tablet (40 mg total) by mouth daily.  . Probiotic Product (ALIGN) 4 MG CAPS Take 4 mg by mouth daily.  . simvastatin (ZOCOR) 5 MG tablet Take 5 mg by mouth at bedtime.   Marland Kitchen zinc oxide 20 % ointment Apply 1 application topically as needed for irritation. To buttocks after every incontinent episode and as needed for redness   No facility-administered encounter medications on file as of 09/05/2018.     Review of Systems  Unable to perform ROS: Dementia    Immunization History  Administered Date(s) Administered  . Influenza-Unspecified 01/10/2017, 01/13/2018  . PPD Test  06/24/2016  . Pneumococcal-Unspecified 06/26/2012  . Td 12/02/2011  . Zoster Recombinat (Shingrix) 06/10/2017, 08/12/2017   Pertinent  Health Maintenance Due  Topic Date Due  . OPHTHALMOLOGY EXAM  06/06/1940  . URINE MICROALBUMIN  06/06/1940  . FOOT EXAM  08/07/2018  . INFLUENZA VACCINE  11/01/2018  . HEMOGLOBIN A1C  12/24/2018  . PNA vac Low Risk Adult  Completed   Fall Risk  12/13/2017 10/14/2017 12/07/2016 01/19/2016 07/21/2015  Falls in the past year? No No No Yes Yes  Number falls in past yr: - - - 1 1  Injury with Fall? - - - No No  Risk for fall due to : - Impaired balance/gait - Impaired balance/gait -  Follow up - - - Falls prevention discussed Falls prevention discussed   Functional Status Survey:    Vitals:   09/05/18 1143  BP: 120/80  Pulse: (!) 108  Resp:  18  Temp: (!) 97.1 F (36.2 C)  TempSrc: Oral  SpO2: 96%  Weight: 179 lb 11.2 oz (81.5 kg)  Height: 6\' 1"  (1.854 m)   Body mass index is 23.71 kg/m. Physical Exam Vitals signs reviewed.  HENT:     Head: Normocephalic.     Nose: Nose normal.     Mouth/Throat:     Mouth: Mucous membranes are moist.     Pharynx: Oropharynx is clear.  Eyes:     Pupils: Pupils are equal, round, and reactive to light.  Neck:     Musculoskeletal: Neck supple.  Cardiovascular:     Rate and Rhythm: Regular rhythm. Tachycardia present.     Pulses: Normal pulses.     Heart sounds: Normal heart sounds. No murmur.  Pulmonary:     Effort: Pulmonary effort is normal. No respiratory distress.     Breath sounds: Normal breath sounds. No wheezing.  Abdominal:     General: Abdomen is flat. Bowel sounds are normal. There is no distension.     Palpations: Abdomen is soft.     Tenderness: There is no abdominal tenderness.  Musculoskeletal:        General: No swelling.  Skin:    General: Skin is warm and dry.  Neurological:     General: No focal deficit present.     Mental Status: He is alert.     Comments: Severe Dementia  Follows Commands Mostly Aphasic  Follows with his Eyes  Psychiatric:        Mood and Affect: Mood normal.        Thought Content: Thought content normal.        Judgment: Judgment normal.     Labs reviewed: Recent Labs    04/26/18 0316  05/13/18 0251  05/19/18 0306  08/31/18 0547 09/01/18 0821 09/03/18 0301  NA 148*   < > 142   < > 150*   < > 134* 141 144  K 3.7   < > 4.1   < > 3.7   < > 4.6 3.9 3.5  CL 115*   < > 115*   < > 117*   < > 99 106 110  CO2 26   < > 21*   < > 28   < > 22 23 24   GLUCOSE 168*   < > 104*   < > 152*   < > 354* 140* 196*  BUN 34*   < > 18   < > 20   < > 25* 17 20  CREATININE 1.32*   < > 1.00   < > 0.97   < > 1.06 0.77 0.76  CALCIUM 8.2*   < > 7.8*   < > 8.0*   < > 8.8* 8.7* 9.0  MG 2.2  --  2.1  --  2.1  --   --   --   --   PHOS 3.3  --  3.1  --   --   --   --   --   --    < > = values in this interval not displayed.   Recent Labs    04/27/18 0321 05/01/18 0034 05/11/18 1626 05/13/18 0251 05/28/18  AST 44* 47* 37  --  12*  ALT 40 60* 27  --  13  ALKPHOS 91 111 86  --  96  BILITOT 0.5 0.6 0.5  --   --   PROT 5.5* 6.2* 5.4*  --   --  ALBUMIN 2.5* 2.6* 2.5* 2.0*  --    Recent Labs    05/13/18 0251 05/13/18 0505  08/30/18 2328 08/31/18 0547 09/01/18 0821 09/03/18 0301  WBC 7.8 8.2   < > 16.3* 11.6* 9.5 10.4  NEUTROABS 5.1 5.7  --  13.7*  --   --   --   HGB 5.5* 5.5*   < > 14.3 12.6* 12.6* 13.5  HCT 19.5* 19.1*   < > 44.6 38.9* 38.3* 41.7  MCV 92.9 92.7   < > 94.3 93.1 93.4 95.2  PLT 130* 126*   < > 170 127* 131* 147*   < > = values in this interval not displayed.   Lab Results  Component Value Date   TSH 2.61 08/08/2017   Lab Results  Component Value Date   HGBA1C 6.2 06/23/2018   Lab Results  Component Value Date   CHOL 158 06/23/2018   HDL 39 06/23/2018   LDLCALC 99 06/23/2018   TRIG 100 06/23/2018    Significant Diagnostic Results in last 30 days:  Dg Chest Port 1 View  Result Date: 09/03/2018 CLINICAL DATA:   Tachypnea.  Hypoxia. EXAM: PORTABLE CHEST 1 VIEW COMPARISON:  08/31/2018 FINDINGS: The lung volumes are low. There is no pneumothorax. Heart size is enlarged. There is elevation of the right hemidiaphragm. There is volume overload with developing pulmonary edema. There is atelectasis at the lung bases. There is no acute osseous abnormality. IMPRESSION: 1. Cardiomegaly with developing pulmonary edema. 2. Low lung volumes with persistent elevation of the right hemidiaphragm. Electronically Signed   By: Constance Holster M.D.   On: 09/03/2018 03:54   Dg Chest Portable 1 View  Result Date: 08/31/2018 CLINICAL DATA:  Shortness of breath EXAM: PORTABLE CHEST 1 VIEW COMPARISON:  05/21/2018 FINDINGS: Low lung volumes. Probable small effusions. Bibasilar airspace disease. Stable cardiomediastinal silhouette. No pneumothorax. IMPRESSION: Low lung volumes with suspected small effusions and bibasilar airspace disease, atelectasis versus pneumonia. Electronically Signed   By: Donavan Foil M.D.   On: 08/31/2018 00:35    Assessment/Plan Aspiration pneumonia  Finishing his Antibiotics On Honey Thick Liquids with Aspiration Precautions  Essential hypertension with Mild tachycardia Will increase his Coreg to 6.25 mg BID Chronic combined systolic and diastolic congestive heart failure (HCC) BNP was high in hospital Continue On Lasix  Type 2 diabetes mellitus with diabetic chronic kidney disease,  On Metformin Follow CBG  Severe Dementia Continue Aricept and Namenda Supportive care Hyperlipidemia LDL goal <70 On Statin Iron deficiency anemia, With GI Bleed No More work up right now Hgb much Improved on Iron Major depression, recurrent,  Stable on Wellbutrin ACP Patient is poor prognosis Family wants to continue Full treatment for now. He is DNR    Family/ staff Communication:   Labs/tests ordered:  BMP and CBC in 2 weeks   Total time spent in this patient care encounter was  _45  minutes;  greater than 50% of the visit spent counseling patient and staff, reviewing records , Labs and coordinating care for problems addressed at this encounter.

## 2018-09-11 ENCOUNTER — Telehealth: Payer: Self-pay

## 2018-09-11 NOTE — Telephone Encounter (Signed)
VM left for patients wife to schedule visit with Palliative Care.

## 2018-09-15 ENCOUNTER — Telehealth: Payer: Self-pay

## 2018-09-15 NOTE — Telephone Encounter (Signed)
Received phone call from Jacobus, Education officer, museum at [facility], who stated that patient is at [facility] now. Otilio Carpen shared that she did not think patient's wife wanted palliative care but wanted this to be verified with [facility] SW, Katie. Email sent to inquire about this.

## 2018-09-16 DIAGNOSIS — I10 Essential (primary) hypertension: Secondary | ICD-10-CM | POA: Diagnosis not present

## 2018-09-16 LAB — CBC AND DIFFERENTIAL
HCT: 42 (ref 41–53)
Hemoglobin: 13.8 (ref 13.5–17.5)
Platelets: 105 — AB (ref 150–399)
WBC: 13.8

## 2018-09-16 LAB — BASIC METABOLIC PANEL
BUN: 26 — AB (ref 4–21)
Creatinine: 1.1 (ref 0.6–1.3)
Glucose: 221
Potassium: 3.6 (ref 3.4–5.3)
Sodium: 155 — AB (ref 137–147)

## 2018-09-17 ENCOUNTER — Non-Acute Institutional Stay (SKILLED_NURSING_FACILITY): Payer: Medicare Other | Admitting: Nurse Practitioner

## 2018-09-17 ENCOUNTER — Encounter: Payer: Self-pay | Admitting: Nurse Practitioner

## 2018-09-17 DIAGNOSIS — R1310 Dysphagia, unspecified: Secondary | ICD-10-CM

## 2018-09-17 DIAGNOSIS — G301 Alzheimer's disease with late onset: Secondary | ICD-10-CM

## 2018-09-17 DIAGNOSIS — E870 Hyperosmolality and hypernatremia: Secondary | ICD-10-CM | POA: Diagnosis not present

## 2018-09-17 DIAGNOSIS — K922 Gastrointestinal hemorrhage, unspecified: Secondary | ICD-10-CM

## 2018-09-17 DIAGNOSIS — F0280 Dementia in other diseases classified elsewhere without behavioral disturbance: Secondary | ICD-10-CM

## 2018-09-17 DIAGNOSIS — D72829 Elevated white blood cell count, unspecified: Secondary | ICD-10-CM

## 2018-09-17 DIAGNOSIS — E1122 Type 2 diabetes mellitus with diabetic chronic kidney disease: Secondary | ICD-10-CM

## 2018-09-17 DIAGNOSIS — I5042 Chronic combined systolic (congestive) and diastolic (congestive) heart failure: Secondary | ICD-10-CM

## 2018-09-17 NOTE — Assessment & Plan Note (Signed)
09/16/18 wbc 13.8, Hgb 13.8 comparing to 09/03/18 wbc 10.4, Hgb 10.4 in absence of s/s infection, etiology is more likely hemoconcentration. Will observe for s/s of infection.

## 2018-09-17 NOTE — Assessment & Plan Note (Addendum)
616/20 wbc 13.8, Hgb 13.8, plt 105, Na 155, K 3.6, Bun 26, creat 1.08 09/16/18 serum sodium 155. The patient appears dry, no apparent swelling in setting of Hx of CHF/on Furosemide 20mg  qd. Hx of Dysphagia, on honey thick liquid and advanced dementia/needs assistance with feeding are contributory. HPOA desired to treat the patient in SNF [facility] for now. Dr. Lyndel Safe advised to start D5 75cc/hr x 2051ml, BMP in am. The benefit and risk are explained to HPOA.

## 2018-09-17 NOTE — Assessment & Plan Note (Signed)
Near total care of his ADLs including feeding self. Will continue Donepezil, Memantine. Return to his usual care setting SNF [facility]  with personal sitter may be beneficial.

## 2018-09-17 NOTE — Assessment & Plan Note (Signed)
Will start SSI Novolog 2u 151-200, 4u 201-250, 6u 251-300, 8u 301-350, 10u 351-400, 15u >401 and call while on IVF>  Continue Metformin 250mg  bid.

## 2018-09-17 NOTE — Assessment & Plan Note (Signed)
Compensated presently, dc Furosemide for now, may resume it when he is euvolumic or s/s decompensation CHF develops. Observe.

## 2018-09-17 NOTE — Assessment & Plan Note (Signed)
Risk for aspiration, continue honey thick liquids.

## 2018-09-17 NOTE — Assessment & Plan Note (Signed)
Hx of. Stable, continue Protonix

## 2018-09-17 NOTE — Progress Notes (Signed)
Location:   [facility] Room Number: 413/K Place of Service:  SNF (31) Provider: [facility] Mast NP  [name], MD  Patient Care Team: [name], MD as PCP - General (Internal Medicine) [name], MD as PCP - Cardiology (Cardiology) Mast, Man X, NP as Nurse Practitioner (Internal Medicine) [name], [name], NP as Nurse Practitioner (Family Medicine)  Extended Emergency Contact Information Primary Emergency Contact: [name] Address: [place], [place] Phone: [phone] Mobile Phone: [phone] Relation: Spouse Secondary Emergency Contact: [name] [place] Phone: [phone] Work Phone: [phone] Relation: Daughter  Code Status: DNR Goals of care: Advanced Directive information Advanced Directives 09/17/2018  Does Patient Have a Medical Advance Directive? Yes  Type of Advance Directive Living will;Healthcare Power of Attorney  Does patient want to make changes to medical advance directive? No - Patient declined  Copy of [place] in Chart? Yes - validated most recent copy scanned in chart (See row information)  Would patient like information on creating a medical advance directive? No - Patient declined  Pre-existing out of facility DNR order (yellow form or pink MOST form) -     Chief Complaint  Patient presents with  . Acute Visit    Dysphagia, elevated serum sodium     HPI:  Pt is a 85 y.o. male seen today for an acute visit for elevated serum sodium 155 09/16/18. The patient has history of dysphagia, on honey thick liquids. Hx of dementia, needs assistance with all ADLs including feeding at meals, on Donepezil 10mg  qd, Memantine 10mg  bid. CHF, compensated on Furosemide 20mg  qd. T2DM, on Metformin 250mg  bid. GERD stable on Protonix 40mg  qd.    Past Medical History:  Diagnosis Date  . Abnormality of gait   . Backache, unspecified   .  CHF (congestive heart failure) ([place])   . Colon polyps    adenomatous  . Dementia ([place]) 11/12/2012  . Depression   . Diabetes mellitus without complication ([place])   . Diverticulosis   . Essential and other specified forms of tremor   . Hemorrhoids   . History of bilateral hip replacements 11/12/2012  . Memory loss   . 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 Pain in joint, pelvic region and thigh   . Skin cancer of scalp   . Spinal stenosis, lumbar region, without neurogenic claudication   . Unspecified hereditary and idiopathic peripheral neuropathy    Past Surgical History:  Procedure Laterality Date  . CATARACT EXTRACTION, BILATERAL  2012  . JOINT REPLACEMENT    . RETINAL LASER PROCEDURE  2012   retinal wrinkle  . SKIN CANCER EXCISION    . TOTAL HIP ARTHROPLASTY Left 2000  . TOTAL HIP ARTHROPLASTY (aka REPLACEMENT) Right 2011    Allergies  Allergen Reactions  . Axona [Bacid] Other (See Comments)    Unknown per MAR  . Gabapentin Other (See Comments)    Hallucinations    Allergies as of 09/17/2018      Reactions   Axona [bacid] Other (See Comments)   Unknown per MAR   Gabapentin Other (See Comments)   Hallucinations      Medication List       Accurate as of September 17, 2018  1:25 PM. If you have any questions, ask your nurse or doctor.        albuterol (  2.5 MG/3ML) 0.083% nebulizer solution Commonly known as: PROVENTIL Take 3 mLs (2.5 mg total) by nebulization every 4 (four) hours as needed for wheezing or shortness of breath.   Align 4 MG Caps Take 4 mg by mouth daily.   benzonatate 100 MG capsule Commonly known as: TESSALON Take 1 capsule (100 mg total) by mouth 3 (three) times daily.   buPROPion 100 MG 12 hr tablet Commonly known as: WELLBUTRIN SR Take 100 mg by mouth daily.   carvedilol 6.25 MG tablet Commonly known as: COREG Take 6.25 mg by mouth 2 (two) times daily with a meal. What changed: Another medication  with the same name was removed. Continue taking this medication, and follow the directions you see here. Changed by: Man X Mast, NP   dextromethorphan-guaiFENesin 30-600 MG 12hr tablet Commonly known as: MUCINEX DM Take 1 tablet by mouth 2 (two) times daily.   docusate sodium 100 MG capsule Commonly known as: COLACE Take 100 mg by mouth daily.   donepezil 10 MG tablet Commonly known as: ARICEPT Take 10 mg by mouth daily.   dorzolamide-timolol 22.3-6.8 MG/ML ophthalmic solution Commonly known as: COSOPT Place 1 drop into the left eye 2 (two) times daily.   ferrous sulfate 325 (65 FE) MG tablet Take 325 mg by mouth daily with breakfast.   Flaxseed Oil 1000 MG Caps Take 1,000 mg by mouth daily.   folic acid 341 MCG tablet Commonly known as: FOLVITE Take 800 mcg by mouth every evening.   food thickener Powd Commonly known as: THICK IT Take 1 g by mouth daily.   furosemide 20 MG tablet Commonly known as: LASIX Take 1 tablet (20 mg total) by mouth daily.   memantine 10 MG tablet Commonly known as: NAMENDA Take 10 mg by mouth 2 (two) times daily.   metFORMIN 500 MG tablet Commonly known as: GLUCOPHAGE Take 250 mg by mouth 2 (two) times daily with a meal.   multivitamin tablet Take 1 tablet by mouth daily.   OVER THE COUNTER MEDICATION Apply 41 % topically as needed. Aquaphor Healing(white petrolatum) ointment apply to dry or red skin   pantoprazole 40 MG tablet Commonly known as: PROTONIX Take 1 tablet (40 mg total) by mouth daily.   simvastatin 5 MG tablet Commonly known as: ZOCOR Take 5 mg by mouth at bedtime.   Vitamin D 1000 units capsule Take 1,000 Units by mouth daily.   zinc oxide 20 % ointment Apply 1 application topically as needed for irritation. To buttocks after every incontinent episode and as needed for redness      ROS was provided with assistance of staff.  Review of Systems  Constitutional: Positive for activity change, appetite change  and fatigue. Negative for chills, diaphoresis and fever.  HENT: Positive for hearing loss and trouble swallowing. Negative for congestion and voice change.        Honey thick  Eyes: Negative for visual disturbance.  Respiratory: Positive for cough. Negative for shortness of breath and wheezing.   Cardiovascular: Negative for chest pain and leg swelling.  Gastrointestinal: Negative for abdominal distention, abdominal pain, constipation, diarrhea, nausea and vomiting.  Genitourinary: Negative for difficulty urinating, dysuria and urgency.  Musculoskeletal: Positive for gait problem.  Neurological: Negative for dizziness, facial asymmetry, speech difficulty, weakness and headaches.       Dementia  Psychiatric/Behavioral: Negative for agitation, behavioral problems and hallucinations. The patient is not nervous/anxious.     Immunization History  Administered Date(s) Administered  . Influenza-Unspecified 01/10/2017, 01/13/2018  .  PPD Test 06/24/2016  . Pneumococcal-Unspecified 06/26/2012  . Td 12/02/2011  . Zoster Recombinat (Shingrix) 06/10/2017, 08/12/2017   Pertinent  Health Maintenance Due  Topic Date Due  . OPHTHALMOLOGY EXAM  06/06/1940  . URINE MICROALBUMIN  06/06/1940  . FOOT EXAM  08/07/2018  . INFLUENZA VACCINE  11/01/2018  . HEMOGLOBIN A1C  12/24/2018  . PNA vac Low Risk Adult  Completed   Fall Risk  12/13/2017 10/14/2017 12/07/2016 01/19/2016 07/21/2015  Falls in the past year? No No No Yes Yes  Number falls in past yr: - - - 1 1  Injury with Fall? - - - No No  Risk for fall due to : - Impaired balance/gait - Impaired balance/gait -  Follow up - - - Falls prevention discussed Falls prevention discussed   Functional Status Survey:    Vitals:   09/17/18 1221  BP: (!) 142/90  Pulse: 90  Resp: 20  Temp: (!) 97.1 F (36.2 C)  SpO2: 94%  Weight: 166 lb (75.3 kg)  Height: 5\' 9"  (1.753 m)   Body mass index is 24.51 kg/m. Physical Exam Constitutional:      General: He  is not in acute distress.    Appearance: Normal appearance. He is ill-appearing. He is not toxic-appearing or diaphoretic.  HENT:     Head: Normocephalic and atraumatic.     Nose: Nose normal. No congestion or rhinorrhea.     Mouth/Throat:     Mouth: Mucous membranes are dry.  Eyes:     Extraocular Movements: Extraocular movements intact.     Conjunctiva/sclera: Conjunctivae normal.     Pupils: Pupils are equal, round, and reactive to light.  Neck:     Musculoskeletal: Normal range of motion and neck supple.  Cardiovascular:     Rate and Rhythm: Normal rate and regular rhythm.     Heart sounds: No murmur.  Pulmonary:     Effort: Pulmonary effort is normal.     Breath sounds: No wheezing, rhonchi or rales.  Abdominal:     General: Bowel sounds are normal. There is no distension.     Palpations: Abdomen is soft.     Tenderness: There is no abdominal tenderness. There is no right CVA tenderness, left CVA tenderness, guarding or rebound.  Musculoskeletal:     Right lower leg: No edema.     Left lower leg: No edema.     Comments: Needs assistance with bed mobility.   Skin:    General: Skin is warm and dry.  Neurological:     General: No focal deficit present.     Mental Status: He is alert. Mental status is at baseline.     Cranial Nerves: No cranial nerve deficit.     Motor: No weakness.     Coordination: Coordination normal.     Gait: Gait abnormal.     Comments: Non verbal. Follow simple directions  Psychiatric:        Mood and Affect: Mood normal.        Behavior: Behavior normal.     Labs reviewed: Recent Labs    04/26/18 0316  05/13/18 0251  05/19/18 0306  08/31/18 0547 09/01/18 0821 09/03/18 0301  NA 148*   < > 142   < > 150*   < > 134* 141 144  K 3.7   < > 4.1   < > 3.7   < > 4.6 3.9 3.5  CL 115*   < > 115*   < > 117*   < >  99 106 110  CO2 26   < > 21*   < > 28   < > 22 23 24   GLUCOSE 168*   < > 104*   < > 152*   < > 354* 140* 196*  BUN 34*   < > 18   < >  20   < > 25* 17 20  CREATININE 1.32*   < > 1.00   < > 0.97   < > 1.06 0.77 0.76  CALCIUM 8.2*   < > 7.8*   < > 8.0*   < > 8.8* 8.7* 9.0  MG 2.2  --  2.1  --  2.1  --   --   --   --   PHOS 3.3  --  3.1  --   --   --   --   --   --    < > = values in this interval not displayed.   Recent Labs    04/27/18 0321 05/01/18 0034 05/11/18 1626 05/13/18 0251 05/28/18  AST 44* 47* 37  --  12*  ALT 40 60* 27  --  13  ALKPHOS 91 111 86  --  96  BILITOT 0.5 0.6 0.5  --   --   PROT 5.5* 6.2* 5.4*  --   --   ALBUMIN 2.5* 2.6* 2.5* 2.0*  --    Recent Labs    05/13/18 0251 05/13/18 0505  08/30/18 2328 08/31/18 0547 09/01/18 0821 09/03/18 0301  WBC 7.8 8.2   < > 16.3* 11.6* 9.5 10.4  NEUTROABS 5.1 5.7  --  13.7*  --   --   --   HGB 5.5* 5.5*   < > 14.3 12.6* 12.6* 13.5  HCT 19.5* 19.1*   < > 44.6 38.9* 38.3* 41.7  MCV 92.9 92.7   < > 94.3 93.1 93.4 95.2  PLT 130* 126*   < > 170 127* 131* 147*   < > = values in this interval not displayed.   Lab Results  Component Value Date   TSH 2.61 08/08/2017   Lab Results  Component Value Date   HGBA1C 6.2 06/23/2018   Lab Results  Component Value Date   CHOL 158 06/23/2018   HDL 39 06/23/2018   LDLCALC 99 06/23/2018   TRIG 100 06/23/2018    Significant Diagnostic Results in last 30 days:  Dg Chest Port 1 View  Result Date: 09/03/2018 CLINICAL DATA:  Tachypnea.  Hypoxia. EXAM: PORTABLE CHEST 1 VIEW COMPARISON:  08/31/2018 FINDINGS: The lung volumes are low. There is no pneumothorax. Heart size is enlarged. There is elevation of the right hemidiaphragm. There is volume overload with developing pulmonary edema. There is atelectasis at the lung bases. There is no acute osseous abnormality. IMPRESSION: 1. Cardiomegaly with developing pulmonary edema. 2. Low lung volumes with persistent elevation of the right hemidiaphragm. Electronically Signed   By: Constance Holster M.D.   On: 09/03/2018 03:54   Dg Chest Portable 1 View  Result Date:  08/31/2018 CLINICAL DATA:  Shortness of breath EXAM: PORTABLE CHEST 1 VIEW COMPARISON:  05/21/2018 FINDINGS: Low lung volumes. Probable small effusions. Bibasilar airspace disease. Stable cardiomediastinal silhouette. No pneumothorax. IMPRESSION: Low lung volumes with suspected small effusions and bibasilar airspace disease, atelectasis versus pneumonia. Electronically Signed   By: Donavan Foil M.D.   On: 08/31/2018 00:35    Assessment/Plan: Acute hypernatremia 616/20 wbc 13.8, Hgb 13.8, plt 105, Na 155, K 3.6, Bun 26, creat 1.08 09/16/18 serum  sodium 155. The patient appears dry, no apparent swelling in setting of Hx of CHF/on Furosemide 20mg  qd. Hx of Dysphagia, on honey thick liquid and advanced dementia/needs assistance with feeding are contributory. HPOA desired to treat the patient in SNF [facility] for now. Dr. Lyndel Safe advised to start D5 75cc/hr x 2062ml, BMP in am. The benefit and risk are explained to HPOA.   Type 2 diabetes mellitus with diabetic chronic kidney disease ([place]) Will start SSI Novolog 2u 151-200, 4u 201-250, 6u 251-300, 8u 301-350, 10u 351-400, 15u >401 and call while on IVF>  Continue Metformin 250mg  bid.   Dysphagia Risk for aspiration, continue honey thick liquids.   Upper GI bleed Hx of. Stable, continue Protonix  Chronic combined systolic and diastolic congestive heart failure ([place]) Compensated presently, dc Furosemide for now, may resume it when he is euvolumic or s/s decompensation CHF develops. Observe.   Alzheimer disease ([place]) Near total care of his ADLs including feeding self. Will continue Donepezil, Memantine. Return to his usual care setting SNF [facility]  with personal sitter may be beneficial.   Leukocytosis 09/16/18 wbc 13.8, Hgb 13.8 comparing to 09/03/18 wbc 10.4, Hgb 10.4 in absence of s/s infection, etiology is more likely hemoconcentration. Will observe for s/s of infection.    Family/ staff Communication: plan of care reviewed with the patient, the patient's  HPOA, social worker, and Camera operator.  Labs/tests ordered:  BMP   Time spend 35 minutes.

## 2018-09-18 ENCOUNTER — Non-Acute Institutional Stay (SKILLED_NURSING_FACILITY): Payer: Medicare Other | Admitting: Internal Medicine

## 2018-09-18 ENCOUNTER — Encounter: Payer: Self-pay | Admitting: Internal Medicine

## 2018-09-18 DIAGNOSIS — I10 Essential (primary) hypertension: Secondary | ICD-10-CM | POA: Diagnosis not present

## 2018-09-18 DIAGNOSIS — E1122 Type 2 diabetes mellitus with diabetic chronic kidney disease: Secondary | ICD-10-CM

## 2018-09-18 DIAGNOSIS — R627 Adult failure to thrive: Secondary | ICD-10-CM

## 2018-09-18 DIAGNOSIS — E870 Hyperosmolality and hypernatremia: Secondary | ICD-10-CM | POA: Diagnosis not present

## 2018-09-18 DIAGNOSIS — R1310 Dysphagia, unspecified: Secondary | ICD-10-CM

## 2018-09-18 DIAGNOSIS — I5042 Chronic combined systolic (congestive) and diastolic (congestive) heart failure: Secondary | ICD-10-CM

## 2018-09-18 LAB — BASIC METABOLIC PANEL
BUN: 30 — AB (ref 4–21)
Creatinine: 1.1 (ref 0.6–1.3)
Glucose: 96
Potassium: 3.4 (ref 3.4–5.3)
Sodium: 151 — AB (ref 137–147)

## 2018-09-18 NOTE — Progress Notes (Signed)
Location: [facility] Room Number: 923/A Place of Service:  SNF (31)  Provider:   Code Status:  Goals of Care:  Advanced Directives 09/18/2018  Does Patient Have a Medical Advance Directive? Yes  Type of Advance Directive Living will;Healthcare Power of Attorney  Does patient want to make changes to medical advance directive? No - Patient declined  Copy of [place] in Chart? Yes - validated most recent copy scanned in chart (See row information)  Would patient like information on creating a medical advance directive? No - Patient declined  Pre-existing out of facility DNR order (yellow form or pink MOST form) -     Chief Complaint  Patient presents with  . Acute Visit    Hypermatremia    HPI: Patient is a 85 y.o. male seen today for an acute visit for Follow up on Hypernatremia  Patient was in the hospital from 5/30-6/04 For Aspiration pneumonia with Hypoxia and sepsis He has h/o Recurrent Aspiration Pneumonia due to Dysphagia. He also has h/o Type 2 diabetes,Advanceddementia, hypertension, systolic CHF EF of 17-61 %, depression, anemia, due to GI Bleed Patient is long term resident of facility. He had regular Labs drawn and it showed Sodium of 155 His wife wanted him to be treated here with IV fluids He was started on Dextrose for 2 lit. Lasix Stopped His Mental status is at baseline. Per Nurses he is eating . No fever or Hypoxia or cough. No Nausea or vomiting or diarrhea Unable to give any history due to his Dementia He also has Pressure Ulcer  Past Medical History:  Diagnosis Date  . Abnormality of gait   . Backache, unspecified   . CHF (congestive heart failure) ([place])   . Colon polyps    adenomatous  . Dementia ([place]) 11/12/2012  . Depression   . Diabetes mellitus without complication ([place])   . Diverticulosis   . Essential and other specified forms of tremor   . Hemorrhoids   . History of bilateral hip replacements  11/12/2012  . Memory loss   . 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 Pain in joint, pelvic region and thigh   . Skin cancer of scalp   . Spinal stenosis, lumbar region, without neurogenic claudication   . Unspecified hereditary and idiopathic peripheral neuropathy     Past Surgical History:  Procedure Laterality Date  . CATARACT EXTRACTION, BILATERAL  2012  . JOINT REPLACEMENT    . RETINAL LASER PROCEDURE  2012   retinal wrinkle  . SKIN CANCER EXCISION    . TOTAL HIP ARTHROPLASTY Left 2000  . TOTAL HIP ARTHROPLASTY (aka REPLACEMENT) Right 2011    Allergies  Allergen Reactions  . Axona [Bacid] Other (See Comments)    Unknown per MAR  . Gabapentin Other (See Comments)    Hallucinations    Outpatient Encounter Medications as of 09/18/2018  Medication Sig  . albuterol (PROVENTIL) (2.5 MG/3ML) 0.083% nebulizer solution Take 3 mLs (2.5 mg total) by nebulization every 4 (four) hours as needed for wheezing or shortness of breath.  . benzonatate (TESSALON) 100 MG capsule Take 1 capsule (100 mg total) by mouth 3 (three) times daily.  Marland Kitchen buPROPion (WELLBUTRIN SR) 100 MG 12 hr tablet Take 100 mg by mouth daily.  . carvedilol (COREG) 6.25 MG tablet Take 6.25 mg by mouth 2 (two) times daily with a meal.  . Cholecalciferol (VITAMIN D) 1000 UNITS capsule Take 1,000 Units by mouth  daily.    . dextromethorphan-guaiFENesin (MUCINEX DM) 30-600 MG 12hr tablet Take 1 tablet by mouth 2 (two) times daily.  Marland Kitchen docusate sodium (COLACE) 100 MG capsule Take 100 mg by mouth daily.  Marland Kitchen donepezil (ARICEPT) 10 MG tablet Take 10 mg by mouth daily.  . dorzolamide-timolol (COSOPT) 22.3-6.8 MG/ML ophthalmic solution Place 1 drop into the left eye 2 (two) times daily.   . ferrous sulfate 325 (65 FE) MG tablet Take 325 mg by mouth daily with breakfast.  . Flaxseed, Linseed, (FLAXSEED OIL) 1000 MG CAPS Take 1,000 mg by mouth daily.    . folic acid (FOLVITE) 660 MCG tablet Take 800  mcg by mouth every evening.   . food thickener (THICK IT) POWD Take 1 g by mouth daily.  . insulin aspart (NOVOLOG) 100 UNIT/ML injection Inject into the skin every 6 (six) hours. Per sliding scale if Blood sugar is 151 to 200, give 2 units,if blood sugar is 201 to 250 give 4 units,251-300 give 6 units,301-350give 8 units,351-400 give 10 units,401--401 give 15 units,401 give 15 units greater than 401 give 15 units  . memantine (NAMENDA) 10 MG tablet Take 10 mg by mouth 2 (two) times daily.  . metFORMIN (GLUCOPHAGE) 500 MG tablet Take 250 mg by mouth 2 (two) times daily with a meal.   . Multiple Vitamin (MULTIVITAMIN) tablet Take 1 tablet by mouth daily.    Marland Kitchen OVER THE COUNTER MEDICATION Apply 41 % topically as needed. Aquaphor Healing(white petrolatum) ointment apply to dry or red skin  . pantoprazole (PROTONIX) 40 MG tablet Take 1 tablet (40 mg total) by mouth daily.  . Probiotic Product (ALIGN) 4 MG CAPS Take 4 mg by mouth daily.  . simvastatin (ZOCOR) 5 MG tablet Take 5 mg by mouth at bedtime.   Marland Kitchen zinc oxide 20 % ointment Apply 1 application topically as needed for irritation. To buttocks after every incontinent episode and as needed for redness  . [DISCONTINUED] furosemide (LASIX) 20 MG tablet Take 1 tablet (20 mg total) by mouth daily.   No facility-administered encounter medications on file as of 09/18/2018.     Review of Systems:  Review of Systems  Unable to perform ROS: Dementia     Health Maintenance  Topic Date Due  . OPHTHALMOLOGY EXAM  06/06/1940  . URINE MICROALBUMIN  06/06/1940  . FOOT EXAM  08/07/2018  . TETANUS/TDAP  09/29/2026 (Originally 12/01/2021)  . INFLUENZA VACCINE  11/01/2018  . HEMOGLOBIN A1C  12/24/2018  . PNA vac Low Risk Adult  Completed    Physical Exam: Vitals:   09/18/18 1450  BP: (!) 146/68  Pulse: 83  Resp: 20  Temp: 98.5 F (36.9 C)  SpO2: 96%  Weight: 166 lb (75.3 kg)  Height: 5\' 9"  (1.753 m)   Body mass index is 24.51 kg/m. Physical Exam  Vitals signs reviewed.  HENT:     Head: Normocephalic.     Nose: Nose normal.     Mouth/Throat:     Mouth: Mucous membranes are moist.     Pharynx: Oropharynx is clear.  Eyes:     Pupils: Pupils are equal, round, and reactive to light.  Neck:     Musculoskeletal: Neck supple.  Cardiovascular:     Rate and Rhythm: Normal rate and regular rhythm.     Pulses: Normal pulses.  Pulmonary:     Effort: Pulmonary effort is normal. No respiratory distress.     Breath sounds: No wheezing or rales.  Abdominal:  General: Abdomen is flat. Bowel sounds are normal.     Palpations: Abdomen is soft.  Musculoskeletal:        General: No swelling.  Skin:    General: Skin is warm.     Comments: Has Stage 2 pressure Ulcer in Sacral Area Also Has Fungal Rash in Groin Area  Neurological:     General: No focal deficit present.     Mental Status: He is alert.     Comments: Does not follow Commands. Respond sometimes Has severe Dementia  Psychiatric:        Mood and Affect: Mood normal.        Thought Content: Thought content normal.        Judgment: Judgment normal.     Labs reviewed: Basic Metabolic Panel: Recent Labs    04/26/18 0316  05/13/18 0251  05/19/18 0306  08/31/18 0547 09/01/18 0821 09/03/18 0301  NA 148*   < > 142   < > 150*   < > 134* 141 144  K 3.7   < > 4.1   < > 3.7   < > 4.6 3.9 3.5  CL 115*   < > 115*   < > 117*   < > 99 106 110  CO2 26   < > 21*   < > 28   < > 22 23 24   GLUCOSE 168*   < > 104*   < > 152*   < > 354* 140* 196*  BUN 34*   < > 18   < > 20   < > 25* 17 20  CREATININE 1.32*   < > 1.00   < > 0.97   < > 1.06 0.77 0.76  CALCIUM 8.2*   < > 7.8*   < > 8.0*   < > 8.8* 8.7* 9.0  MG 2.2  --  2.1  --  2.1  --   --   --   --   PHOS 3.3  --  3.1  --   --   --   --   --   --    < > = values in this interval not displayed.   Liver Function Tests: Recent Labs    04/27/18 0321 05/01/18 0034 05/11/18 1626 05/13/18 0251 05/28/18  AST 44* 47* 37  --  12*  ALT  40 60* 27  --  13  ALKPHOS 91 111 86  --  96  BILITOT 0.5 0.6 0.5  --   --   PROT 5.5* 6.2* 5.4*  --   --   ALBUMIN 2.5* 2.6* 2.5* 2.0*  --    No results for input(s): LIPASE, AMYLASE in the last 8760 hours. No results for input(s): AMMONIA in the last 8760 hours. CBC: Recent Labs    05/13/18 0251 05/13/18 0505  08/30/18 2328 08/31/18 0547 09/01/18 0821 09/03/18 0301  WBC 7.8 8.2   < > 16.3* 11.6* 9.5 10.4  NEUTROABS 5.1 5.7  --  13.7*  --   --   --   HGB 5.5* 5.5*   < > 14.3 12.6* 12.6* 13.5  HCT 19.5* 19.1*   < > 44.6 38.9* 38.3* 41.7  MCV 92.9 92.7   < > 94.3 93.1 93.4 95.2  PLT 130* 126*   < > 170 127* 131* 147*   < > = values in this interval not displayed.   Lipid Panel: Recent Labs    06/23/18  CHOL 158  HDL 39  LDLCALC 99  TRIG 100   Lab Results  Component Value Date   HGBA1C 6.2 06/23/2018    Procedures since last visit: Dg Chest Port 1 View  Result Date: 09/03/2018 CLINICAL DATA:  Tachypnea.  Hypoxia. EXAM: PORTABLE CHEST 1 VIEW COMPARISON:  08/31/2018 FINDINGS: The lung volumes are low. There is no pneumothorax. Heart size is enlarged. There is elevation of the right hemidiaphragm. There is volume overload with developing pulmonary edema. There is atelectasis at the lung bases. There is no acute osseous abnormality. IMPRESSION: 1. Cardiomegaly with developing pulmonary edema. 2. Low lung volumes with persistent elevation of the right hemidiaphragm. Electronically Signed   By: Constance Holster M.D.   On: 09/03/2018 03:54   Dg Chest Portable 1 View  Result Date: 08/31/2018 CLINICAL DATA:  Shortness of breath EXAM: PORTABLE CHEST 1 VIEW COMPARISON:  05/21/2018 FINDINGS: Low lung volumes. Probable small effusions. Bibasilar airspace disease. Stable cardiomediastinal silhouette. No pneumothorax. IMPRESSION: Low lung volumes with suspected small effusions and bibasilar airspace disease, atelectasis versus pneumonia. Electronically Signed   By: Donavan Foil M.D.    On: 08/31/2018 00:35    Assessment/Plan Acute hypernatremia - Plan:  Finished 2l Of IV D5 Discontinue IV fluids Repeat Bmp Showed Sodium of 150 Will continue PO Fluids Increased free water with Meals Repeat BMP in few days  Adult failure to thrive - Plan:  Worrisome that this will keep happening due to his Dementia Will discontinue Aricept, Iron and statin to see if it improved PO intake Also will consider Remeron  Type 2 diabetes mellitus  CBG Q4 hours covered with Sliding scale Now that D 5 discontinued Will discontinue Frequent CBG Continue on Metformin  Essential hypertension - Plan:  On Low dose of Coreg  Chronic combined systolic and diastolic congestive heart failure (HCC) - Plan:  Holding Lasix due to Hypernatremia  Dysphagia, unspecified type - Plan:  Continue on Honey Thick Diet Continue to be Risk of Aspiration Stage 2 Pressure Ulcer Per wound care Start on Hydrogel and Nystatin Air Matress ACP Have D/W the Wife before she continues to  him treated full scope     Labs/tests ordered: BMP  Total time spent in this patient care encounter was  45_  minutes; greater than 50% of the visit spent counseling  staff, reviewing records , Labs and coordinating care for problems addressed at this encounter.

## 2018-09-22 DIAGNOSIS — E870 Hyperosmolality and hypernatremia: Secondary | ICD-10-CM | POA: Diagnosis not present

## 2018-09-22 LAB — BASIC METABOLIC PANEL
BUN: 20 (ref 4–21)
Creatinine: 1 (ref 0.6–1.3)
Glucose: 181
Potassium: 3.8 (ref 3.4–5.3)
Sodium: 147 (ref 137–147)

## 2018-09-23 ENCOUNTER — Encounter: Payer: Self-pay | Admitting: Internal Medicine

## 2018-09-23 ENCOUNTER — Non-Acute Institutional Stay (SKILLED_NURSING_FACILITY): Payer: Medicare Other | Admitting: Internal Medicine

## 2018-09-23 DIAGNOSIS — R627 Adult failure to thrive: Secondary | ICD-10-CM | POA: Diagnosis not present

## 2018-09-23 DIAGNOSIS — I5042 Chronic combined systolic (congestive) and diastolic (congestive) heart failure: Secondary | ICD-10-CM | POA: Diagnosis not present

## 2018-09-23 DIAGNOSIS — E870 Hyperosmolality and hypernatremia: Secondary | ICD-10-CM | POA: Diagnosis not present

## 2018-09-23 DIAGNOSIS — E1122 Type 2 diabetes mellitus with diabetic chronic kidney disease: Secondary | ICD-10-CM | POA: Diagnosis not present

## 2018-09-23 NOTE — Progress Notes (Signed)
Location:  [facility] Room Number: [phone] Place of Service:  SNF [phone]) Provider:  Dr. Meredith Staggers L. Dorita Sciara, Rene Kocher, MD  Patient Care Team: [name], MD as PCP - General (Internal Medicine) [name], MD as PCP - Cardiology (Cardiology) Mast, Man X, NP as Nurse Practitioner (Internal Medicine) [name], [name], NP as Nurse Practitioner (Family Medicine)  Extended Emergency Contact Information Primary Emergency Contact: [name] Address: [place], [place] Phone: [phone] Mobile Phone: [phone] Relation: Spouse Secondary Emergency Contact: [name] [place] Phone: [phone] Work Phone: [phone] Relation: Daughter  Code Status:   Goals of care: Advanced Directive information Advanced Directives 09/23/2018  Does Patient Have a Medical Advance Directive? Yes  Type of Paramedic of [place];Living will  Does patient want to make changes to medical advance directive? No - Patient declined  Copy of [place] in Chart? Yes - validated most recent copy scanned in chart (See row information)  Would patient like information on creating a medical advance directive? -  Pre-existing out of facility DNR order (yellow form or pink MOST form) -     Chief Complaint  Patient presents with  . Follow-up    Follow up    HPI:  Pt is a 85 y.o. male seen today for medical management of chronic diseases.  For Follow up of Hypernatremia Patient was in the hospital from 5/30-6/04 For Aspiration pneumonia with Hypoxia and sepsis He has h/o Recurrent Aspiration Pneumonia due to Dysphagia. He also has h/o Type 2 diabetes,Advanceddementia, hypertension, systolic CHFEF of 25-85 %, depression, anemia, due to GI Bleed Patient is long term resident of facility. He had regular Labs drawn and it showed Sodium of 155 His wife  wanted him to be treated here with IV fluids He was started on Dextrose for 2 lit. Lasix Stopped Since then his Repeat Sodium is 150 and Now 147 D/W wife Will continue to monitor in [place] Patient's mental status is at baseline.  Per nurses he is eating.  No fever No hypoxia or cough no nausea vomiting or diarrhea.  Unable to give any history due to his dementia Has pressure ulcer stage II.   Past Medical History:  Diagnosis Date  . Abnormality of gait   . Backache, unspecified   . CHF (congestive heart failure) ([place])   . Colon polyps    adenomatous  . Dementia ([place]) 11/12/2012  . Depression   . Diabetes mellitus without complication ([place])   . Diverticulosis   . Essential and other specified forms of tremor   . Hemorrhoids   . History of bilateral hip replacements 11/12/2012  . Memory loss   . 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 Pain in joint, pelvic region and thigh   . Skin cancer of scalp   . Spinal stenosis, lumbar region, without neurogenic claudication   . Unspecified hereditary and idiopathic peripheral neuropathy    Past Surgical History:  Procedure Laterality Date  . CATARACT EXTRACTION, BILATERAL  2012  . JOINT REPLACEMENT    . RETINAL LASER PROCEDURE  2012   retinal wrinkle  . SKIN CANCER EXCISION    . TOTAL HIP ARTHROPLASTY Left 2000  . TOTAL HIP ARTHROPLASTY (aka REPLACEMENT) Right 2011    Allergies  Allergen Reactions  . Axona [Bacid] Other (See Comments)  Unknown per MAR  . Gabapentin Other (See Comments)    Hallucinations    Outpatient Encounter Medications as of 09/23/2018  Medication Sig  . albuterol (PROVENTIL) (2.5 MG/3ML) 0.083% nebulizer solution Take 3 mLs (2.5 mg total) by nebulization every 4 (four) hours as needed for wheezing or shortness of breath.  . benzonatate (TESSALON) 100 MG capsule Take 1 capsule (100 mg total) by mouth 3 (three) times daily.  Marland Kitchen buPROPion (WELLBUTRIN SR) 100 MG 12 hr  tablet Take 100 mg by mouth daily.  . carvedilol (COREG) 6.25 MG tablet Take 6.25 mg by mouth 2 (two) times daily with a meal.  . Cholecalciferol (VITAMIN D) 1000 UNITS capsule Take 1,000 Units by mouth daily.    Marland Kitchen dextromethorphan-guaiFENesin (MUCINEX DM) 30-600 MG 12hr tablet Take 1 tablet by mouth 2 (two) times daily.  Marland Kitchen docusate sodium (COLACE) 100 MG capsule Take 100 mg by mouth daily.  . dorzolamide-timolol (COSOPT) 22.3-6.8 MG/ML ophthalmic solution Place 1 drop into the left eye 2 (two) times daily.   . Flaxseed, Linseed, (FLAXSEED OIL) 1000 MG CAPS Take 1,000 mg by mouth daily.    . folic acid (FOLVITE) 354 MCG tablet Take 800 mcg by mouth every evening.   . food thickener (THICK IT) POWD Take 1 g by mouth daily.  . memantine (NAMENDA) 10 MG tablet Take 10 mg by mouth 2 (two) times daily.  . metFORMIN (GLUCOPHAGE) 500 MG tablet Take 250 mg by mouth 2 (two) times daily with a meal.   . Multiple Vitamin (MULTIVITAMIN) tablet Take 1 tablet by mouth daily.    Marland Kitchen nystatin (MYCOSTATIN/NYSTOP) powder Apply topically 2 (two) times daily. Apply Nystatin Powder to left intergluteal fold and buttocks MASD until redness resolved  . OVER THE COUNTER MEDICATION Apply 41 % topically as needed. Aquaphor Healing(white petrolatum) ointment apply to dry or red skin  . pantoprazole (PROTONIX) 40 MG tablet Take 1 tablet (40 mg total) by mouth daily.  . Probiotic Product (ALIGN) 4 MG CAPS Take 4 mg by mouth daily.  Marland Kitchen zinc oxide 20 % ointment Apply 1 application topically as needed for irritation. To buttocks after every incontinent episode and as needed for redness  . [DISCONTINUED] donepezil (ARICEPT) 10 MG tablet Take 10 mg by mouth daily.  . [DISCONTINUED] ferrous sulfate 325 (65 FE) MG tablet Take 325 mg by mouth daily with breakfast.  . [DISCONTINUED] insulin aspart (NOVOLOG) 100 UNIT/ML injection Inject into the skin every 6 (six) hours. Per sliding scale if Blood sugar is 151 to 200, give 2 units,if  blood sugar is 201 to 250 give 4 units,251-300 give 6 units,301-350give 8 units,351-400 give 10 units,401--401 give 15 units,401 give 15 units greater than 401 give 15 units  . [DISCONTINUED] simvastatin (ZOCOR) 5 MG tablet Take 5 mg by mouth at bedtime.    No facility-administered encounter medications on file as of 09/23/2018.     Review of Systems  Unable to perform ROS: Dementia    Immunization History  Administered Date(s) Administered  . Influenza-Unspecified 01/10/2017, 01/13/2018  . PPD Test 06/24/2016  . Pneumococcal-Unspecified 06/26/2012  . Td 12/02/2011  . Zoster Recombinat (Shingrix) 06/10/2017, 08/12/2017   Pertinent  Health Maintenance Due  Topic Date Due  . OPHTHALMOLOGY EXAM  06/06/1940  . URINE MICROALBUMIN  06/06/1940  . FOOT EXAM  08/07/2018  . INFLUENZA VACCINE  11/01/2018  . HEMOGLOBIN A1C  12/24/2018  . PNA vac Low Risk Adult  Completed   Fall Risk  12/13/2017 10/14/2017 12/07/2016 01/19/2016  07/21/2015  Falls in the past year? No No No Yes Yes  Number falls in past yr: - - - 1 1  Injury with Fall? - - - No No  Risk for fall due to : - Impaired balance/gait - Impaired balance/gait -  Follow up - - - Falls prevention discussed Falls prevention discussed   Functional Status Survey:    Vitals:   09/23/18 0959  BP: 114/66  Pulse: 80  Resp: 20  Temp: (!) 97.5 F (36.4 C)  TempSrc: Oral  SpO2: 97%  Weight: 164 lb 12.8 oz (74.8 kg)  Height: 5\' 3"  (1.6 m)   Body mass index is 29.19 kg/m. Physical Exam Vitals signs reviewed.  HENT:     Head: Normocephalic.     Nose: Nose normal.  Eyes:     Pupils: Pupils are equal, round, and reactive to light.  Neck:     Musculoskeletal: Neck supple.  Cardiovascular:     Rate and Rhythm: Normal rate and regular rhythm.  Pulmonary:     Effort: Pulmonary effort is normal.     Comments: Has few rales Bilateral Abdominal:     General: Abdomen is flat. Bowel sounds are normal.     Palpations: Abdomen is soft.   Musculoskeletal:     Comments: Mild swelling Bilateral  Skin:    General: Skin is warm and dry.     Comments: Pressure Sacral Ulcer stage 2 Groin has fungal Rash  Neurological:     General: No focal deficit present.     Mental Status: He is alert.     Comments: Not Oriented. Does not follow Commands  Psychiatric:        Mood and Affect: Mood normal.        Thought Content: Thought content normal.     Labs reviewed: Recent Labs    04/26/18 0316  05/13/18 0251  05/19/18 0306  08/31/18 0547 09/01/18 0821 09/03/18 0301 09/16/18 09/18/18  NA 148*   < > 142   < > 150*   < > 134* 141 144 155* 151*  K 3.7   < > 4.1   < > 3.7   < > 4.6 3.9 3.5 3.6 3.4  CL 115*   < > 115*   < > 117*   < > 99 106 110  --   --   CO2 26   < > 21*   < > 28   < > 22 23 24   --   --   GLUCOSE 168*   < > 104*   < > 152*   < > 354* 140* 196*  --   --   BUN 34*   < > 18   < > 20   < > 25* 17 20 26* 30*  CREATININE 1.32*   < > 1.00   < > 0.97   < > 1.06 0.77 0.76 1.1 1.1  CALCIUM 8.2*   < > 7.8*   < > 8.0*   < > 8.8* 8.7* 9.0  --   --   MG 2.2  --  2.1  --  2.1  --   --   --   --   --   --   PHOS 3.3  --  3.1  --   --   --   --   --   --   --   --    < > = values in this interval not displayed.  Recent Labs    04/27/18 0321 05/01/18 0034 05/11/18 1626 05/13/18 0251 05/28/18  AST 44* 47* 37  --  12*  ALT 40 60* 27  --  13  ALKPHOS 91 111 86  --  96  BILITOT 0.5 0.6 0.5  --   --   PROT 5.5* 6.2* 5.4*  --   --   ALBUMIN 2.5* 2.6* 2.5* 2.0*  --    Recent Labs    05/13/18 0251 05/13/18 0505  08/30/18 2328 08/31/18 0547 09/01/18 0821 09/03/18 0301 09/16/18  WBC 7.8 8.2   < > 16.3* 11.6* 9.5 10.4 13.8  NEUTROABS 5.1 5.7  --  13.7*  --   --   --   --   HGB 5.5* 5.5*   < > 14.3 12.6* 12.6* 13.5 13.8  HCT 19.5* 19.1*   < > 44.6 38.9* 38.3* 41.7 42  MCV 92.9 92.7   < > 94.3 93.1 93.4 95.2  --   PLT 130* 126*   < > 170 127* 131* 147* 105*   < > = values in this interval not displayed.   Lab Results   Component Value Date   TSH 2.61 08/08/2017   Lab Results  Component Value Date   HGBA1C 6.2 06/23/2018   Lab Results  Component Value Date   CHOL 158 06/23/2018   HDL 39 06/23/2018   LDLCALC 99 06/23/2018   TRIG 100 06/23/2018    Significant Diagnostic Results in last 30 days:  Dg Chest Port 1 View  Result Date: 09/03/2018 CLINICAL DATA:  Tachypnea.  Hypoxia. EXAM: PORTABLE CHEST 1 VIEW COMPARISON:  08/31/2018 FINDINGS: The lung volumes are low. There is no pneumothorax. Heart size is enlarged. There is elevation of the right hemidiaphragm. There is volume overload with developing pulmonary edema. There is atelectasis at the lung bases. There is no acute osseous abnormality. IMPRESSION: 1. Cardiomegaly with developing pulmonary edema. 2. Low lung volumes with persistent elevation of the right hemidiaphragm. Electronically Signed   By: Constance Holster M.D.   On: 09/03/2018 03:54   Dg Chest Portable 1 View  Result Date: 08/31/2018 CLINICAL DATA:  Shortness of breath EXAM: PORTABLE CHEST 1 VIEW COMPARISON:  05/21/2018 FINDINGS: Low lung volumes. Probable small effusions. Bibasilar airspace disease. Stable cardiomediastinal silhouette. No pneumothorax. IMPRESSION: Low lung volumes with suspected small effusions and bibasilar airspace disease, atelectasis versus pneumonia. Electronically Signed   By: Donavan Foil M.D.   On: 08/31/2018 00:35    Assessment/Plan  Acute hypernatremia - Plan:  Sodium Much Better after 2 l of D5 Eating . Encourage Po Fluids Bmp in 1 week  Adult failure to thrive - Plan:  Worrisome that this will keep happening due to his Dementia  Discontinued Aricept, Iron and statin to see if it improved PO intake Also will consider Remeron I had a long discussion with her his wife again today.  And explained her that this is going to keep happening to him.  At this time she still wants him to be transferred to the hospital if needed and does not want to make any  other decisions.  Type 2 diabetes mellitus  CBG Q am On Metformin Essential hypertension - Plan:  On Low dose of Coreg  Chronic combined systolic and diastolic congestive heart failure ([place]) - Plan:  His weight is stable But has Rales and Edema Will start him Back on Low dose of Lasix 20 mg 2 days /Week  Dysphagia, unspecified type - Plan:  Continue  on Honey Thick Diet Continue to be Risk of Aspiration Stage 2 Pressure Ulcer Per wound care Start on Hydrogel and Nystatin Air Matress ACP Have D/W the Wife again today she wants full full scope   Family/ staff Communication:   Labs/tests ordered:  BMP in 1 week  Total time spent in this patient care encounter was  _25  minutes; greater than 50% of the visit spent counseling staff, reviewing records , Labs and coordinating care for problems addressed at this encounter.

## 2018-09-26 ENCOUNTER — Non-Acute Institutional Stay (SKILLED_NURSING_FACILITY): Payer: Medicare Other | Admitting: Internal Medicine

## 2018-09-26 ENCOUNTER — Encounter: Payer: Self-pay | Admitting: Internal Medicine

## 2018-09-26 DIAGNOSIS — I10 Essential (primary) hypertension: Secondary | ICD-10-CM | POA: Diagnosis not present

## 2018-09-26 DIAGNOSIS — E870 Hyperosmolality and hypernatremia: Secondary | ICD-10-CM | POA: Diagnosis not present

## 2018-09-26 DIAGNOSIS — I5042 Chronic combined systolic (congestive) and diastolic (congestive) heart failure: Secondary | ICD-10-CM | POA: Diagnosis not present

## 2018-09-26 DIAGNOSIS — J690 Pneumonitis due to inhalation of food and vomit: Secondary | ICD-10-CM

## 2018-09-26 DIAGNOSIS — R1310 Dysphagia, unspecified: Secondary | ICD-10-CM

## 2018-09-26 DIAGNOSIS — R0902 Hypoxemia: Secondary | ICD-10-CM | POA: Diagnosis not present

## 2018-09-26 NOTE — Progress Notes (Signed)
Location:  [facility] Room Number: [phone] Place of Service:  SNF [phone]) Provider:  Dr. Clydene Fake, MD  Patient Care Team: [name], MD as PCP - General (Internal Medicine) [name], MD as PCP - Cardiology (Cardiology) Mast, Man X, NP as Nurse Practitioner (Internal Medicine) [name], [name], NP as Nurse Practitioner (Family Medicine)  Extended Emergency Contact Information Primary Emergency Contact: [name] Address: [place], [place] Phone: [phone] Mobile Phone: [phone] Relation: Spouse Secondary Emergency Contact: [name] [place] of [place] Phone: [phone] Work Phone: [phone] Relation: Daughter  Code Status:  FULL Goals of care: Advanced Directive information Advanced Directives 09/26/2018  Does Patient Have a Medical Advance Directive? Yes  Type of Paramedic of [place];Living will  Does patient want to make changes to medical advance directive? No - Patient declined  Copy of [place] in Chart? Yes - validated most recent copy scanned in chart (See row information)  Would patient like information on creating a medical advance directive? -  Pre-existing out of facility DNR order (yellow form or pink MOST form) -     Chief Complaint  Patient presents with  . Acute Visit    Hypoxia     HPI:  Pt is a 85 y.o. male seen today for an acute visit for episode of Hypoxia this morning Patient was in the hospital Recently from 5/30-6/04 For Aspiration pneumonia with Hypoxia and sepsis He has h/o Recurrent Aspiration Pneumonia due to Dysphagia He also has h/o Type 2 diabetes,Advanceddementia, hypertension, systolic CHFEF of 67-12 %, depression, anemia, due to GI Bleed He also had Hypernatremia recently He was hydrated with 2l of D5 and Lasix stopped This morning patient was found to  have hypoxia.with respiratory distress He got Nebs and Chest Xray was ordered. Xray doe snot show any Acute Process. Some Vascular Congestion Patient has responded to Nebs and is now comfortable with no acute distress.. Unable to give any history due to dementia.  Per nurses patient does have a cough but no fever.    Past Medical History:  Diagnosis Date  . Abnormality of gait   . Backache, unspecified   . CHF (congestive heart failure) ([place])   . Colon polyps    adenomatous  . Dementia ([place]) 11/12/2012  . Depression   . Diabetes mellitus without complication ([place])   . Diverticulosis   . Essential and other specified forms of tremor   . Hemorrhoids   . History of bilateral hip replacements 11/12/2012  . Memory loss   . 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 Pain in joint, pelvic region and thigh   . Skin cancer of scalp   . Spinal stenosis, lumbar region, without neurogenic claudication   . Unspecified hereditary and idiopathic peripheral neuropathy    Past Surgical History:  Procedure Laterality Date  . CATARACT EXTRACTION, BILATERAL  2012  . JOINT REPLACEMENT    . RETINAL LASER PROCEDURE  2012   retinal wrinkle  . SKIN CANCER EXCISION    . TOTAL HIP ARTHROPLASTY Left 2000  . TOTAL HIP ARTHROPLASTY (aka REPLACEMENT) Right 2011    Allergies  Allergen Reactions  . Axona [Bacid] Other (See Comments)    Unknown per MAR  . Gabapentin Other (See Comments)    Hallucinations    Outpatient Encounter  Medications as of 09/26/2018  Medication Sig  . albuterol (PROVENTIL) (2.5 MG/3ML) 0.083% nebulizer solution Take 3 mLs (2.5 mg total) by nebulization every 4 (four) hours as needed for wheezing or shortness of breath.  . benzonatate (TESSALON) 100 MG capsule Take 1 capsule (100 mg total) by mouth 3 (three) times daily.  Marland Kitchen buPROPion (WELLBUTRIN SR) 100 MG 12 hr tablet Take 100 mg by mouth daily.  . carvedilol (COREG) 6.25 MG tablet Take 6.25 mg  by mouth 2 (two) times daily with a meal.  . Cholecalciferol (VITAMIN D) 1000 UNITS capsule Take 1,000 Units by mouth daily.    Marland Kitchen dextromethorphan-guaiFENesin (MUCINEX DM) 30-600 MG 12hr tablet Take 1 tablet by mouth 2 (two) times daily.  Marland Kitchen docusate sodium (COLACE) 100 MG capsule Take 100 mg by mouth daily.  . dorzolamide-timolol (COSOPT) 22.3-6.8 MG/ML ophthalmic solution Place 1 drop into the left eye 2 (two) times daily.   . Flaxseed, Linseed, (FLAXSEED OIL) 1000 MG CAPS Take 1,000 mg by mouth daily.    . folic acid (FOLVITE) 240 MCG tablet Take 800 mcg by mouth every evening.   . food thickener (THICK IT) POWD Take 1 g by mouth daily.  . memantine (NAMENDA) 10 MG tablet Take 10 mg by mouth 2 (two) times daily.  . metFORMIN (GLUCOPHAGE) 500 MG tablet Take 250 mg by mouth 2 (two) times daily with a meal.   . Multiple Vitamin (MULTIVITAMIN) tablet Take 1 tablet by mouth daily.    Marland Kitchen nystatin (MYCOSTATIN/NYSTOP) powder Apply topically 2 (two) times daily. Apply Nystatin Powder to left intergluteal fold and buttocks MASD until redness resolved  . OVER THE COUNTER MEDICATION Apply 41 % topically as needed. Aquaphor Healing(white petrolatum) ointment apply to dry or red skin  . pantoprazole (PROTONIX) 40 MG tablet Take 1 tablet (40 mg total) by mouth daily.  . Probiotic Product (ALIGN) 4 MG CAPS Take 4 mg by mouth daily.  Marland Kitchen zinc oxide 20 % ointment Apply 1 application topically as needed for irritation. To buttocks after every incontinent episode and as needed for redness   No facility-administered encounter medications on file as of 09/26/2018.     Review of Systems  Unable to perform ROS: Dementia    Immunization History  Administered Date(s) Administered  . Influenza-Unspecified 01/10/2017, 01/13/2018  . PPD Test 06/24/2016  . Pneumococcal-Unspecified 06/26/2012  . Td 12/02/2011  . Zoster Recombinat (Shingrix) 06/10/2017, 08/12/2017   Pertinent  Health Maintenance Due  Topic Date Due   . OPHTHALMOLOGY EXAM  06/06/1940  . URINE MICROALBUMIN  06/06/1940  . FOOT EXAM  08/07/2018  . INFLUENZA VACCINE  11/01/2018  . HEMOGLOBIN A1C  12/24/2018  . PNA vac Low Risk Adult  Completed   Fall Risk  12/13/2017 10/14/2017 12/07/2016 01/19/2016 07/21/2015  Falls in the past year? No No No Yes Yes  Number falls in past yr: - - - 1 1  Injury with Fall? - - - No No  Risk for fall due to : - Impaired balance/gait - Impaired balance/gait -  Follow up - - - Falls prevention discussed Falls prevention discussed   Functional Status Survey:    Vitals:   09/26/18 1147  BP: 104/60  Pulse: 84  Resp: (!) 22  Temp: (!) 96.2 F (35.7 C)  TempSrc: Oral  SpO2: 97%  Weight: 164 lb 6.4 oz (74.6 kg)  Height: 5\' 9"  (1.753 m)   Body mass index is 24.28 kg/m. Physical Exam Vitals signs reviewed.  HENT:  Head: Normocephalic.     Nose: Nose normal.     Mouth/Throat:     Mouth: Mucous membranes are moist.     Pharynx: Oropharynx is clear.  Eyes:     Pupils: Pupils are equal, round, and reactive to light.  Neck:     Musculoskeletal: Neck supple.  Cardiovascular:     Rate and Rhythm: Normal rate and regular rhythm.     Pulses: Normal pulses.  Pulmonary:     Effort: Pulmonary effort is normal.     Comments: Has rales in right Lower base Abdominal:     General: Abdomen is flat. Bowel sounds are normal.     Palpations: Abdomen is soft.  Musculoskeletal:     Comments: Mild swelling  Skin:    General: Skin is warm.  Neurological:     General: No focal deficit present.     Mental Status: He is alert.     Comments: Not Oriented. Does not follow Commands   Psychiatric:        Mood and Affect: Mood normal.        Thought Content: Thought content normal.     Labs reviewed: Recent Labs    04/26/18 0316  05/13/18 0251  05/19/18 0306  08/31/18 0547 09/01/18 0821 09/03/18 0301 09/16/18 09/18/18  NA 148*   < > 142   < > 150*   < > 134* 141 144 155* 151*  K 3.7   < > 4.1   < > 3.7    < > 4.6 3.9 3.5 3.6 3.4  CL 115*   < > 115*   < > 117*   < > 99 106 110  --   --   CO2 26   < > 21*   < > 28   < > 22 23 24   --   --   GLUCOSE 168*   < > 104*   < > 152*   < > 354* 140* 196*  --   --   BUN 34*   < > 18   < > 20   < > 25* 17 20 26* 30*  CREATININE 1.32*   < > 1.00   < > 0.97   < > 1.06 0.77 0.76 1.1 1.1  CALCIUM 8.2*   < > 7.8*   < > 8.0*   < > 8.8* 8.7* 9.0  --   --   MG 2.2  --  2.1  --  2.1  --   --   --   --   --   --   PHOS 3.3  --  3.1  --   --   --   --   --   --   --   --    < > = values in this interval not displayed.   Recent Labs    04/27/18 0321 05/01/18 0034 05/11/18 1626 05/13/18 0251 05/28/18  AST 44* 47* 37  --  12*  ALT 40 60* 27  --  13  ALKPHOS 91 111 86  --  96  BILITOT 0.5 0.6 0.5  --   --   PROT 5.5* 6.2* 5.4*  --   --   ALBUMIN 2.5* 2.6* 2.5* 2.0*  --    Recent Labs    05/13/18 0251 05/13/18 0505  08/30/18 2328 08/31/18 0547 09/01/18 0821 09/03/18 0301 09/16/18  WBC 7.8 8.2   < > 16.3* 11.6* 9.5 10.4 13.8  NEUTROABS 5.1 5.7  --  13.7*  --   --   --   --   HGB 5.5* 5.5*   < > 14.3 12.6* 12.6* 13.5 13.8  HCT 19.5* 19.1*   < > 44.6 38.9* 38.3* 41.7 42  MCV 92.9 92.7   < > 94.3 93.1 93.4 95.2  --   PLT 130* 126*   < > 170 127* 131* 147* 105*   < > = values in this interval not displayed.   Lab Results  Component Value Date   TSH 2.61 08/08/2017   Lab Results  Component Value Date   HGBA1C 6.2 06/23/2018   Lab Results  Component Value Date   CHOL 158 06/23/2018   HDL 39 06/23/2018   LDLCALC 99 06/23/2018   TRIG 100 06/23/2018    Significant Diagnostic Results in last 30 days:  Dg Chest Port 1 View  Result Date: 09/03/2018 CLINICAL DATA:  Tachypnea.  Hypoxia. EXAM: PORTABLE CHEST 1 VIEW COMPARISON:  08/31/2018 FINDINGS: The lung volumes are low. There is no pneumothorax. Heart size is enlarged. There is elevation of the right hemidiaphragm. There is volume overload with developing pulmonary edema. There is atelectasis at the  lung bases. There is no acute osseous abnormality. IMPRESSION: 1. Cardiomegaly with developing pulmonary edema. 2. Low lung volumes with persistent elevation of the right hemidiaphragm. Electronically Signed   By: Constance Holster M.D.   On: 09/03/2018 03:54   Dg Chest Portable 1 View  Result Date: 08/31/2018 CLINICAL DATA:  Shortness of breath EXAM: PORTABLE CHEST 1 VIEW COMPARISON:  05/21/2018 FINDINGS: Low lung volumes. Probable small effusions. Bibasilar airspace disease. Stable cardiomediastinal silhouette. No pneumothorax. IMPRESSION: Low lung volumes with suspected small effusions and bibasilar airspace disease, atelectasis versus pneumonia. Electronically Signed   By: Donavan Foil M.D.   On: 08/31/2018 00:35    Assessment/Plan Hypoxia with Chronic CHF Chest x-ray showed mild congestion Will increase his Lasix to daily again.   His weight has not changed. Repeat BMP pending Started on duo nebs twice daily and as needed for hypoxia.  Acute hypernatremia- Plan:  Sodium Much Better  But now has mild CHF Have restarted Lasix We will repeat BMP Adult failure to thrive - Plan: Worrisome that this will keep happening due to his Dementia, Aspiration and CHF  Discontinued Aricept, Iron and statin   I had a long discussion with his wife before  And explained her that this is going to keep happening to him.  At this time she still wants him to be transferred to the hospital if needed and does not want to make any other decisions.  Type 2 diabetes mellitus CBG Q am On Metformin Essential hypertension - Plan: On Low dose of Coreg  Dysphagia, unspecified type - Plan: Continue on Honey Thick Diet Continue to be Risk of Aspiration Stage 2 Pressure Ulcer Per wound care  on Hydrogel and Nystatin Air Matress   Family/ staff Communication:  Labs/tests ordered: BMP  Total time spent in this patient care encounter was  25_  minutes; greater than 50% of the visit spent  counseling  staff, reviewing records , Labs and coordinating care for problems addressed at this encounter.

## 2018-09-27 DIAGNOSIS — E870 Hyperosmolality and hypernatremia: Secondary | ICD-10-CM | POA: Insufficient documentation

## 2018-09-30 DIAGNOSIS — E870 Hyperosmolality and hypernatremia: Secondary | ICD-10-CM | POA: Diagnosis not present

## 2018-09-30 DIAGNOSIS — E876 Hypokalemia: Secondary | ICD-10-CM | POA: Diagnosis not present

## 2018-09-30 LAB — BASIC METABOLIC PANEL
BUN: 14 (ref 4–21)
Creatinine: 0.7 (ref 0.6–1.3)
Glucose: 154
Potassium: 4.1 (ref 3.4–5.3)
Sodium: 138 (ref 137–147)

## 2018-10-06 ENCOUNTER — Encounter: Payer: Self-pay | Admitting: Internal Medicine

## 2018-10-06 ENCOUNTER — Non-Acute Institutional Stay (SKILLED_NURSING_FACILITY): Payer: Medicare Other | Admitting: Internal Medicine

## 2018-10-06 DIAGNOSIS — E1122 Type 2 diabetes mellitus with diabetic chronic kidney disease: Secondary | ICD-10-CM | POA: Diagnosis not present

## 2018-10-06 DIAGNOSIS — R0902 Hypoxemia: Secondary | ICD-10-CM | POA: Diagnosis not present

## 2018-10-06 DIAGNOSIS — N183 Chronic kidney disease, stage 3 unspecified: Secondary | ICD-10-CM

## 2018-10-06 DIAGNOSIS — J690 Pneumonitis due to inhalation of food and vomit: Secondary | ICD-10-CM | POA: Diagnosis not present

## 2018-10-06 NOTE — Progress Notes (Signed)
Location:  [facility] Room Number: 30 Place of Service:  SNF (31) Provider:  L,MD   [name], MD  Patient Care Team: [name], MD as PCP - General (Internal Medicine) [name], MD as PCP - Cardiology (Cardiology) Mast, Man X, NP as Nurse Practitioner (Internal Medicine) [name], [name], NP as Nurse Practitioner (Family Medicine)  Extended Emergency Contact Information Primary Emergency Contact: [name] Address: [place], [place] of [place] Phone: [phone] Mobile Phone: [phone] Relation: Spouse Secondary Emergency Contact: [name] States of [place] Phone: [phone] Work Phone: [phone] Relation: Daughter  Code Status: DNR Goals of care: Advanced Directive information Advanced Directives 10/06/2018  Does Patient Have a Medical Advance Directive? Yes  Type of Paramedic of [place];Living will  Does patient want to make changes to medical advance directive? No - Patient declined  Copy of [place] in Chart? Yes - validated most recent copy scanned in chart (See row information)  Would patient like information on creating a medical advance directive? No - Patient declined  Pre-existing out of facility DNR order (yellow form or pink MOST form) -     Chief Complaint  Patient presents with  . Acute Visit    Hypoxia     HPI:  Pt is a 85 y.o. male seen today for an acute visit for Hypoxia  Patient was in the hospital Recently from 5/30-6/04 For Aspiration pneumonia with Hypoxia and sepsis He has h/o Recurrent Aspiration Pneumonia due to Dysphagia He also has h/o Type 2 diabetes,Advanceddementia, hypertension, systolic CHFEF of 25-85 %, depression, anemia, due to GI Bleed He also had Hypernatremia recently Patient continues to have Hypoxic episodes due to Aspiration He had another episode this  weekend. On call Provider had started patient on Rocephin. But patient is Asymptomatic today. No fever or chills. No Cough.  Cannot give detail history due to Dementia.   Past Medical History:  Diagnosis Date  . Abnormality of gait   . Backache, unspecified   . CHF (congestive heart failure) ([place])   . Colon polyps    adenomatous  . Dementia ([place]) 11/12/2012  . Depression   . Diabetes mellitus without complication ([place])   . Diverticulosis   . Essential and other specified forms of tremor   . Hemorrhoids   . History of bilateral hip replacements 11/12/2012  . Memory loss   . 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 Pain in joint, pelvic region and thigh   . Skin cancer of scalp   . Spinal stenosis, lumbar region, without neurogenic claudication   . Unspecified hereditary and idiopathic peripheral neuropathy    Past Surgical History:  Procedure Laterality Date  . CATARACT EXTRACTION, BILATERAL  2012  . JOINT REPLACEMENT    . RETINAL LASER PROCEDURE  2012   retinal wrinkle  . SKIN CANCER EXCISION    . TOTAL HIP ARTHROPLASTY Left 2000  . TOTAL HIP ARTHROPLASTY (aka REPLACEMENT) Right 2011    Allergies  Allergen Reactions  . Axona [Bacid] Other (See Comments)    Unknown per MAR  . Gabapentin Other (See Comments)    Hallucinations    Outpatient Encounter Medications as of 10/06/2018  Medication Sig  . albuterol (PROVENTIL) (2.5 MG/3ML) 0.083% nebulizer solution Take 3 mLs (2.5 mg total) by nebulization every 4 (four) hours as needed for wheezing  or shortness of breath.  . AMINO ACIDS-PROTEIN HYDROLYS PO Take 30 mLs by mouth daily.  . benzonatate (TESSALON) 100 MG capsule Take 1 capsule (100 mg total) by mouth 3 (three) times daily.  Marland Kitchen buPROPion (WELLBUTRIN SR) 100 MG 12 hr tablet Take 100 mg by mouth daily.  . carvedilol (COREG) 6.25 MG tablet Take 6.25 mg by mouth 2 (two) times daily with a meal.  . cefTRIAXone (ROCEPHIN) 1 g injection  Inject 1 g into the muscle daily.  . Cholecalciferol (VITAMIN D) 1000 UNITS capsule Take 1,000 Units by mouth daily.    Marland Kitchen dextromethorphan-guaiFENesin (MUCINEX DM) 30-600 MG 12hr tablet Take 1 tablet by mouth 2 (two) times daily.  Marland Kitchen docusate sodium (COLACE) 100 MG capsule Take 100 mg by mouth daily.  . dorzolamide-timolol (COSOPT) 22.3-6.8 MG/ML ophthalmic solution Place 1 drop into the left eye 2 (two) times daily.   . Flaxseed, Linseed, (FLAXSEED OIL) 1000 MG CAPS Take 1,000 mg by mouth daily.    . folic acid (FOLVITE) 010 MCG tablet Take 800 mcg by mouth every evening.   . food thickener (THICK IT) POWD Take 1 g by mouth daily.  . furosemide (LASIX) 20 MG tablet Take 20 mg by mouth daily. On Mon, Wed,Fri  . memantine (NAMENDA) 10 MG tablet Take 10 mg by mouth 2 (two) times daily.  . metFORMIN (GLUCOPHAGE) 500 MG tablet Take 250 mg by mouth 2 (two) times daily with a meal.   . Multiple Vitamin (MULTIVITAMIN) tablet Take 1 tablet by mouth daily.    Marland Kitchen nystatin (MYCOSTATIN/NYSTOP) powder Apply topically 2 (two) times daily. Apply Nystatin Powder to left intergluteal fold and buttocks MASD until redness resolved  . OVER THE COUNTER MEDICATION Apply 41 % topically as needed. Aquaphor Healing(white petrolatum) ointment apply to dry or red skin  . pantoprazole (PROTONIX) 40 MG tablet Take 1 tablet (40 mg total) by mouth daily.  . Probiotic Product (ALIGN) 4 MG CAPS Take 4 mg by mouth daily.  Marland Kitchen saccharomyces boulardii (FLORASTOR) 250 MG capsule Take 250 mg by mouth 2 (two) times daily.  Marland Kitchen zinc oxide 20 % ointment Apply 1 application topically as needed for irritation. To buttocks after every incontinent episode and as needed for redness   No facility-administered encounter medications on file as of 10/06/2018.     Review of Systems  Unable to perform ROS: Dementia    Immunization History  Administered Date(s) Administered  . Influenza-Unspecified 01/10/2017, 01/13/2018  . PPD Test 06/24/2016  .  Pneumococcal-Unspecified 06/26/2012  . Td 12/02/2011  . Zoster Recombinat (Shingrix) 06/10/2017, 08/12/2017   Pertinent  Health Maintenance Due  Topic Date Due  . OPHTHALMOLOGY EXAM  06/06/1940  . URINE MICROALBUMIN  06/06/1940  . FOOT EXAM  08/07/2018  . INFLUENZA VACCINE  11/01/2018  . HEMOGLOBIN A1C  12/24/2018  . PNA vac Low Risk Adult  Completed   Fall Risk  12/13/2017 10/14/2017 12/07/2016 01/19/2016 07/21/2015  Falls in the past year? No No No Yes Yes  Number falls in past yr: - - - 1 1  Injury with Fall? - - - No No  Risk for fall due to : - Impaired balance/gait - Impaired balance/gait -  Follow up - - - Falls prevention discussed Falls prevention discussed   Functional Status Survey:    Vitals:   10/06/18 1010  BP: 128/64  Pulse: 74  Resp: 20  Temp: (!) 96.7 F (35.9 C)  SpO2: 92%  Weight: 173 lb 11.2 oz (78.8 kg)  Height: 6' (1.829 m)   Body mass index is 23.56 kg/m. Physical Exam Vitals signs reviewed.  Constitutional:      Appearance: Normal appearance.  HENT:     Head: Normocephalic.     Nose: Nose normal.     Mouth/Throat:     Mouth: Mucous membranes are moist.     Pharynx: Oropharynx is clear.  Eyes:     Pupils: Pupils are equal, round, and reactive to light.  Neck:     Musculoskeletal: Neck supple.  Cardiovascular:     Rate and Rhythm: Normal rate and regular rhythm.     Pulses: Normal pulses.  Pulmonary:     Effort: Pulmonary effort is normal. No respiratory distress.     Breath sounds: Normal breath sounds. No wheezing or rales.  Abdominal:     General: Abdomen is flat. Bowel sounds are normal.     Palpations: Abdomen is soft.  Musculoskeletal:        General: No swelling.  Skin:    General: Skin is warm and dry.  Neurological:     General: No focal deficit present.     Mental Status: He is alert.  Psychiatric:        Mood and Affect: Mood normal.        Thought Content: Thought content normal.        Judgment: Judgment normal.      Labs reviewed: Recent Labs    04/26/18 0316  05/13/18 0251  05/19/18 0306  08/31/18 0547 09/01/18 0821 09/03/18 0301 09/16/18 09/18/18 09/22/18  NA 148*   < > 142   < > 150*   < > 134* 141 144 155* 151* 147  K 3.7   < > 4.1   < > 3.7   < > 4.6 3.9 3.5 3.6 3.4 3.8  CL 115*   < > 115*   < > 117*   < > 99 106 110  --   --   --   CO2 26   < > 21*   < > 28   < > 22 23 24   --   --   --   GLUCOSE 168*   < > 104*   < > 152*   < > 354* 140* 196*  --   --   --   BUN 34*   < > 18   < > 20   < > 25* 17 20 26* 30* 20  CREATININE 1.32*   < > 1.00   < > 0.97   < > 1.06 0.77 0.76 1.1 1.1 1.0  CALCIUM 8.2*   < > 7.8*   < > 8.0*   < > 8.8* 8.7* 9.0  --   --   --   MG 2.2  --  2.1  --  2.1  --   --   --   --   --   --   --   PHOS 3.3  --  3.1  --   --   --   --   --   --   --   --   --    < > = values in this interval not displayed.   Recent Labs    04/27/18 0321 05/01/18 0034 05/11/18 1626 05/13/18 0251 05/28/18  AST 44* 47* 37  --  12*  ALT 40 60* 27  --  13  ALKPHOS 91 111 86  --  96  BILITOT 0.5 0.6 0.5  --   --  PROT 5.5* 6.2* 5.4*  --   --   ALBUMIN 2.5* 2.6* 2.5* 2.0*  --    Recent Labs    05/13/18 0251 05/13/18 0505  08/30/18 2328 08/31/18 0547 09/01/18 0821 09/03/18 0301 09/16/18  WBC 7.8 8.2   < > 16.3* 11.6* 9.5 10.4 13.8  NEUTROABS 5.1 5.7  --  13.7*  --   --   --   --   HGB 5.5* 5.5*   < > 14.3 12.6* 12.6* 13.5 13.8  HCT 19.5* 19.1*   < > 44.6 38.9* 38.3* 41.7 42  MCV 92.9 92.7   < > 94.3 93.1 93.4 95.2  --   PLT 130* 126*   < > 170 127* 131* 147* 105*   < > = values in this interval not displayed.   Lab Results  Component Value Date   TSH 2.61 08/08/2017   Lab Results  Component Value Date   HGBA1C 6.2 06/23/2018   Lab Results  Component Value Date   CHOL 158 06/23/2018   HDL 39 06/23/2018   LDLCALC 99 06/23/2018   TRIG 100 06/23/2018    Significant Diagnostic Results in last 30 days:  No results found.  Assessment/Plan  Hypoxia with Aspiration   Continue Duo Nebs BID Discontinue Rocephin as he is not had fever and Chest Xray in the past has been negative Talked to nurse to try Duo nebs before calling Providers  Acute hypernatremia- Plan: Sodium Much Better 138  Adult failure to thrive - Plan: Worrisome that this will keep happening due to his Dementia, Aspiration and CHF DiscontinuedAricept, Iron and statin   I had a long discussion with his wife before And explained her that this is going to keep happening to him. At this time she still wants him to be transferred to the hospital if needed and does not want to make any other decisions.  Type 2 diabetes mellitus CBG Q am On Metformin Essential hypertension - Plan: On Low dose of Coreg  Dysphagia, unspecified type - Plan: Continue on Honey Thick Diet Continue to be Risk of Aspiration Stage 2 Pressure Ulcer Per wound care  on Hydrogel and Nystatin Air [place]  Family/ staff Communication:   Labs/tests ordered:   Total time spent in this patient care encounter was  25_  minutes; greater than 50% of the visit spent counseling staff, reviewing records , Labs and coordinating care for problems addressed at this encounter.

## 2018-10-11 DIAGNOSIS — Z1159 Encounter for screening for other viral diseases: Secondary | ICD-10-CM | POA: Diagnosis not present

## 2018-10-13 DIAGNOSIS — R05 Cough: Secondary | ICD-10-CM | POA: Diagnosis not present

## 2018-10-17 ENCOUNTER — Encounter: Payer: Self-pay | Admitting: Nurse Practitioner

## 2018-10-17 ENCOUNTER — Non-Acute Institutional Stay (SKILLED_NURSING_FACILITY): Payer: Medicare Other | Admitting: Nurse Practitioner

## 2018-10-17 DIAGNOSIS — R1310 Dysphagia, unspecified: Secondary | ICD-10-CM | POA: Diagnosis not present

## 2018-10-17 DIAGNOSIS — F0280 Dementia in other diseases classified elsewhere without behavioral disturbance: Secondary | ICD-10-CM

## 2018-10-17 DIAGNOSIS — I5042 Chronic combined systolic (congestive) and diastolic (congestive) heart failure: Secondary | ICD-10-CM

## 2018-10-17 DIAGNOSIS — R0902 Hypoxemia: Secondary | ICD-10-CM | POA: Diagnosis not present

## 2018-10-17 DIAGNOSIS — I10 Essential (primary) hypertension: Secondary | ICD-10-CM

## 2018-10-17 DIAGNOSIS — G301 Alzheimer's disease with late onset: Secondary | ICD-10-CM

## 2018-10-19 ENCOUNTER — Encounter: Payer: Self-pay | Admitting: Nurse Practitioner

## 2018-10-19 DIAGNOSIS — R0902 Hypoxemia: Secondary | ICD-10-CM | POA: Insufficient documentation

## 2018-10-19 NOTE — Progress Notes (Signed)
Location:   SNF [place] Room Number: 30/A Place of Service:  SNF (31) Provider: [facility] Titianna Loomis NP  [name], MD  Patient Care Team: [name], MD as PCP - General (Internal Medicine) [name], MD as PCP - Cardiology (Cardiology) Lizania Bouchard X, NP as Nurse Practitioner (Internal Medicine) [name], [name], NP as Nurse Practitioner (Family Medicine)  Extended Emergency Contact Information Primary Emergency Contact: [name] Address: [place], [place] Phone: [phone] Mobile Phone: [phone] Relation: Spouse Secondary Emergency Contact: [name] [place] Phone: [phone] Work Phone: [phone] Relation: Daughter  Code Status: DNR Goals of care: Advanced Directive information Advanced Directives 10/17/2018  Does Patient Have a Medical Advance Directive? Yes  Type of Advance Directive Living will;Healthcare Power of Attorney  Does patient want to make changes to medical advance directive? No - Patient declined  Copy of [place] in Chart? Yes - validated most recent copy scanned in chart (See row information)  Would patient like information on creating a medical advance directive? No - Patient declined  Pre-existing out of facility DNR order (yellow form or pink MOST form) -     Chief Complaint  Patient presents with  . Acute Visit    O2 DESATURATION    HPI:  Pt is a 85 y.o. male seen today for an acute visit for reported O2 desaturation, cough, wheezing, tachycardia/tachypena episodes, usually resolved after sitting up, Neb, O2 administration, CXR 10/12/18 mild stable pulmonary venous congestion, no acute parenchymal pathology or significant interval change. Hx of dysphagia, diet modification, risk for aspiration,  CHF, compensated on Furosemide 20mg  qd, weights are stable, no apparent dependent edema. Advanced dementia, resides in SNF [facility] for  safety and care assistance, on Memantine 10mg  bid for memory. HTN, blood pressure is in control,  heart rate in control except the above mentioned episodes, on Carvedilol 6.25mg  bid.    Past Medical History:  Diagnosis Date  . Abnormality of gait   . Backache, unspecified   . CHF (congestive heart failure) ([place])   . Colon polyps    adenomatous  . Dementia ([place]) 11/12/2012  . Depression   . Diabetes mellitus without complication ([place])   . Diverticulosis   . Essential and other specified forms of tremor   . Hemorrhoids   . History of bilateral hip replacements 11/12/2012  . Memory loss   . 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 Pain in joint, pelvic region and thigh   . Skin cancer of scalp   . Spinal stenosis, lumbar region, without neurogenic claudication   . Unspecified hereditary and idiopathic peripheral neuropathy    Past Surgical History:  Procedure Laterality Date  . CATARACT EXTRACTION, BILATERAL  2012  . JOINT REPLACEMENT    . RETINAL LASER PROCEDURE  2012   retinal wrinkle  . SKIN CANCER EXCISION    . TOTAL HIP ARTHROPLASTY Left 2000  . TOTAL HIP ARTHROPLASTY (aka REPLACEMENT) Right 2011    Allergies  Allergen Reactions  . Axona [Bacid] Other (See Comments)    Unknown per MAR  . Gabapentin Other (See Comments)    Hallucinations    Allergies as of 10/17/2018      Reactions   Axona [bacid] Other (See Comments)   Unknown per MAR   Gabapentin Other (See Comments)   Hallucinations  Medication List       Accurate as of October 17, 2018 11:59 PM. If you have any questions, ask your nurse or doctor.        STOP taking these medications   cefTRIAXone 1 g injection Commonly known as: ROCEPHIN Stopped by: Kirstin Kugler X Elodia Haviland, NP   saccharomyces boulardii 250 MG capsule Commonly known as: FLORASTOR Stopped by: Aloura Matsuoka X Prathik Aman, NP     TAKE these medications   acetaminophen 650 MG suppository Commonly known as: TYLENOL Place 650  mg rectally every 4 (four) hours as needed.   albuterol (2.5 MG/3ML) 0.083% nebulizer solution Commonly known as: PROVENTIL Take 3 mLs (2.5 mg total) by nebulization every 4 (four) hours as needed for wheezing or shortness of breath.   Align 4 MG Caps Take 4 mg by mouth daily.   AMINO ACIDS-PROTEIN HYDROLYS PO Take 30 mLs by mouth daily.   benzonatate 100 MG capsule Commonly known as: TESSALON Take 1 capsule (100 mg total) by mouth 3 (three) times daily.   buPROPion 100 MG 12 hr tablet Commonly known as: WELLBUTRIN SR Take 100 mg by mouth daily.   carvedilol 6.25 MG tablet Commonly known as: COREG Take 6.25 mg by mouth 2 (two) times daily with a meal.   dextromethorphan-guaiFENesin 30-600 MG 12hr tablet Commonly known as: MUCINEX DM Take 1 tablet by mouth 2 (two) times daily.   docusate sodium 100 MG capsule Commonly known as: COLACE Take 100 mg by mouth daily.   dorzolamide-timolol 22.3-6.8 MG/ML ophthalmic solution Commonly known as: COSOPT Place 1 drop into the left eye 2 (two) times daily.   Flaxseed Oil 1000 MG Caps Take 1,000 mg by mouth daily.   folic acid 062 MCG tablet Commonly known as: FOLVITE Take 800 mcg by mouth every evening.   food thickener Powd Commonly known as: THICK IT Take 1 g by mouth daily.   furosemide 20 MG tablet Commonly known as: LASIX Take 20 mg by mouth daily. On Mon, Wed,Fri   ipratropium-albuterol 0.5-2.5 (3) MG/3ML Soln Commonly known as: DUONEB Take 3 mLs by nebulization 2 (two) times daily.   memantine 10 MG tablet Commonly known as: NAMENDA Take 10 mg by mouth 2 (two) times daily.   metFORMIN 500 MG tablet Commonly known as: GLUCOPHAGE Take 250 mg by mouth 2 (two) times daily with a meal.   multivitamin tablet Take 1 tablet by mouth daily.   nystatin powder Commonly known as: MYCOSTATIN/NYSTOP Apply topically 2 (two) times daily. Apply Nystatin Powder to left intergluteal fold and buttocks MASD until redness  resolved   OVER THE COUNTER MEDICATION Apply 41 % topically as needed. Aquaphor Healing(white petrolatum) ointment apply to dry or red skin   pantoprazole 40 MG tablet Commonly known as: PROTONIX Take 1 tablet (40 mg total) by mouth daily.   Vitamin D 1000 units capsule Take 1,000 Units by mouth daily.   zinc oxide 20 % ointment Apply 1 application topically as needed for irritation. To buttocks after every incontinent episode and as needed for redness      ROS was provided with assistance of staff Review of Systems  Constitutional: Negative for activity change, appetite change, chills, diaphoresis, fatigue, fever and unexpected weight change.  HENT: Positive for hearing loss and trouble swallowing. Negative for congestion.        Honey thick  Respiratory: Positive for cough, choking, shortness of breath and wheezing.        Tachypnea episodes.   Cardiovascular: Negative for chest  pain, palpitations and leg swelling.  Gastrointestinal: Negative for abdominal distention, abdominal pain, constipation, diarrhea and nausea.  Genitourinary: Negative for difficulty urinating, dysuria and urgency.  Musculoskeletal: Positive for gait problem.  Neurological: Positive for speech difficulty. Negative for dizziness, weakness and headaches.       Dementia, expressive aphasia.   Psychiatric/Behavioral: Positive for confusion. Negative for agitation, behavioral problems, hallucinations and sleep disturbance. The patient is not nervous/anxious.     Immunization History  Administered Date(s) Administered  . Influenza-Unspecified 01/10/2017, 01/13/2018  . PPD Test 06/24/2016  . Pneumococcal-Unspecified 06/26/2012  . Td 12/02/2011  . Zoster Recombinat (Shingrix) 06/10/2017, 08/12/2017   Pertinent  Health Maintenance Due  Topic Date Due  . OPHTHALMOLOGY EXAM  06/06/1940  . URINE MICROALBUMIN  06/06/1940  . FOOT EXAM  08/07/2018  . INFLUENZA VACCINE  11/01/2018  . HEMOGLOBIN A1C  12/24/2018   . PNA vac Low Risk Adult  Completed   Fall Risk  12/13/2017 10/14/2017 12/07/2016 01/19/2016 07/21/2015  Falls in the past year? No No No Yes Yes  Number falls in past yr: - - - 1 1  Injury with Fall? - - - No No  Risk for fall due to : - Impaired balance/gait - Impaired balance/gait -  Follow up - - - Falls prevention discussed Falls prevention discussed   Functional Status Survey:    Vitals:   10/17/18 1704  BP: 140/80  Pulse: (!) 120  Resp: (!) 24  Temp: (!) 97.3 F (36.3 C)  SpO2: 93%  Weight: 177 lb (80.3 kg)  Height: 6' (1.829 m)   Body mass index is 24.01 kg/m. Physical Exam Vitals signs and nursing note reviewed.  Constitutional:      General: He is not in acute distress.    Appearance: Normal appearance. He is normal weight. He is not ill-appearing, toxic-appearing or diaphoretic.  HENT:     Head: Normocephalic and atraumatic.     Nose: Nose normal.     Mouth/Throat:     Mouth: Mucous membranes are moist.  Eyes:     Extraocular Movements: Extraocular movements intact.     Conjunctiva/sclera: Conjunctivae normal.     Pupils: Pupils are equal, round, and reactive to light.  Neck:     Musculoskeletal: Normal range of motion and neck supple.  Cardiovascular:     Rate and Rhythm: Normal rate and regular rhythm.     Heart sounds: No murmur.     Comments: HR 90s upon my examination.  Pulmonary:     Effort: Pulmonary effort is normal.     Breath sounds: No wheezing, rhonchi or rales.  Abdominal:     General: Bowel sounds are normal.     Palpations: Abdomen is soft.     Tenderness: There is no abdominal tenderness. There is no right CVA tenderness, left CVA tenderness, guarding or rebound.  Musculoskeletal:     Right lower leg: No edema.     Left lower leg: No edema.  Skin:    General: Skin is warm and dry.  Neurological:     General: No focal deficit present.     Mental Status: He is alert. Mental status is at baseline.     Cranial Nerves: No cranial nerve  deficit.     Motor: No weakness.     Coordination: Coordination normal.     Gait: Gait abnormal.     Comments: Oriented to self.   Psychiatric:        Mood and Affect: Mood normal.  Behavior: Behavior normal.     Labs reviewed: Recent Labs    04/26/18 0316  05/13/18 0251  05/19/18 0306  08/31/18 0547 09/01/18 0821 09/03/18 0301 09/16/18 09/18/18 09/22/18  NA 148*   < > 142   < > 150*   < > 134* 141 144 155* 151* 147  K 3.7   < > 4.1   < > 3.7   < > 4.6 3.9 3.5 3.6 3.4 3.8  CL 115*   < > 115*   < > 117*   < > 99 106 110  --   --   --   CO2 26   < > 21*   < > 28   < > 22 23 24   --   --   --   GLUCOSE 168*   < > 104*   < > 152*   < > 354* 140* 196*  --   --   --   BUN 34*   < > 18   < > 20   < > 25* 17 20 26* 30* 20  CREATININE 1.32*   < > 1.00   < > 0.97   < > 1.06 0.77 0.76 1.1 1.1 1.0  CALCIUM 8.2*   < > 7.8*   < > 8.0*   < > 8.8* 8.7* 9.0  --   --   --   MG 2.2  --  2.1  --  2.1  --   --   --   --   --   --   --   PHOS 3.3  --  3.1  --   --   --   --   --   --   --   --   --    < > = values in this interval not displayed.   Recent Labs    04/27/18 0321 05/01/18 0034 05/11/18 1626 05/13/18 0251 05/28/18  AST 44* 47* 37  --  12*  ALT 40 60* 27  --  13  ALKPHOS 91 111 86  --  96  BILITOT 0.5 0.6 0.5  --   --   PROT 5.5* 6.2* 5.4*  --   --   ALBUMIN 2.5* 2.6* 2.5* 2.0*  --    Recent Labs    05/13/18 0251 05/13/18 0505  08/30/18 2328 08/31/18 0547 09/01/18 0821 09/03/18 0301 09/16/18  WBC 7.8 8.2   < > 16.3* 11.6* 9.5 10.4 13.8  NEUTROABS 5.1 5.7  --  13.7*  --   --   --   --   HGB 5.5* 5.5*   < > 14.3 12.6* 12.6* 13.5 13.8  HCT 19.5* 19.1*   < > 44.6 38.9* 38.3* 41.7 42  MCV 92.9 92.7   < > 94.3 93.1 93.4 95.2  --   PLT 130* 126*   < > 170 127* 131* 147* 105*   < > = values in this interval not displayed.   Lab Results  Component Value Date   TSH 2.61 08/08/2017   Lab Results  Component Value Date   HGBA1C 6.2 06/23/2018   Lab Results   Component Value Date   CHOL 158 06/23/2018   HDL 39 06/23/2018   LDLCALC 99 06/23/2018   TRIG 100 06/23/2018    Significant Diagnostic Results in last 30 days:  No results found.  Assessment/Plan: Hypoxemia Dysphagia is contributory in setting of advanced dementia, tachypnea and tachycardia are associated with the choking episodes of  saliva and food,  continue O2 2lpm via [place] to maintain Sat O2>89%, aspiration precaution, monitor for s/s of aspiration PNA. Hospice referral is HPOA consents.   Dysphagia Aspiration precaution, elevate HOB, sitting up straight with chin tucked in when eating/drinking, thicken all liquids.   Chronic combined systolic and diastolic congestive heart failure (HCC) Compensated, continue Furosemide 20mg  qd. Observe weight  HTN (hypertension) Blood pressure is in control, continue Carvedilol 6.25mg  bid.   Alzheimer disease ([place]) Advanced dementia, continue Memantine, continue SNF [facility] for safety and care assistance. Recommend Hospice service if HOPA consents.     Family/ staff Communication: plan of care reviewed with the patient and charge nurse.   Labs/tests ordered: CXR done 10/12/18  Time spend 25 minutes.

## 2018-10-19 NOTE — Assessment & Plan Note (Signed)
Aspiration precaution, elevate HOB, sitting up straight with chin tucked in when eating/drinking, thicken all liquids.

## 2018-10-19 NOTE — Assessment & Plan Note (Signed)
Compensated, continue Furosemide 20mg  qd. Observe weight

## 2018-10-19 NOTE — Assessment & Plan Note (Addendum)
Dysphagia is contributory in setting of advanced dementia, tachypnea and tachycardia are associated with the choking episodes of saliva and food,  continue O2 2lpm via [place] to maintain Sat O2>89%, aspiration precaution, monitor for s/s of aspiration PNA. Hospice referral is HPOA consents.

## 2018-10-19 NOTE — Assessment & Plan Note (Signed)
Blood pressure is in control, continue Carvedilol 6.25mg  bid.

## 2018-10-19 NOTE — Assessment & Plan Note (Signed)
Advanced dementia, continue Memantine, continue SNF [facility] for safety and care assistance. Recommend Hospice service if HOPA consents.

## 2018-10-20 DIAGNOSIS — B342 Coronavirus infection, unspecified: Secondary | ICD-10-CM | POA: Diagnosis not present

## 2018-10-21 ENCOUNTER — Encounter: Payer: Self-pay | Admitting: Internal Medicine

## 2018-10-21 ENCOUNTER — Non-Acute Institutional Stay (SKILLED_NURSING_FACILITY): Payer: Medicare Other | Admitting: Internal Medicine

## 2018-10-21 DIAGNOSIS — F339 Major depressive disorder, recurrent, unspecified: Secondary | ICD-10-CM

## 2018-10-21 DIAGNOSIS — R1310 Dysphagia, unspecified: Secondary | ICD-10-CM | POA: Diagnosis not present

## 2018-10-21 DIAGNOSIS — R627 Adult failure to thrive: Secondary | ICD-10-CM

## 2018-10-21 DIAGNOSIS — E1122 Type 2 diabetes mellitus with diabetic chronic kidney disease: Secondary | ICD-10-CM | POA: Diagnosis not present

## 2018-10-21 DIAGNOSIS — E870 Hyperosmolality and hypernatremia: Secondary | ICD-10-CM | POA: Diagnosis not present

## 2018-10-21 NOTE — Progress Notes (Signed)
Location:  [facility] Room Number: 30/A Place of Service:  SNF (31) Provider:Deshante Cassell L,MD   [name], MD  Patient Care Team: [name], MD as PCP - General (Internal Medicine) [name], MD as PCP - Cardiology (Cardiology) Mast, Man X, NP as Nurse Practitioner (Internal Medicine) [name], [name], NP as Nurse Practitioner (Family Medicine)  Extended Emergency Contact Information Primary Emergency Contact: [name] Address: [address], [place] Phone: [phone] Mobile Phone: [phone] Relation: Spouse Secondary Emergency Contact: [name] [place] of [place] Phone: [phone] Work Phone: [phone] Relation: Daughter  Code Status: DNR  Goals of care: Advanced Directive information Advanced Directives 10/21/2018  Does Patient Have a Medical Advance Directive? Yes  Type of Advance Directive Living will;Out of facility DNR (pink MOST or yellow form);Healthcare Power of Attorney  Does patient want to make changes to medical advance directive? No - Patient declined  Copy of [place] in Chart? Yes - validated most recent copy scanned in chart (See row information)  Would patient like information on creating a medical advance directive? No - Patient declined  Pre-existing out of facility DNR order (yellow form or pink MOST form) Yellow form placed in chart (order not valid for inpatient use)     Chief Complaint  Patient presents with  . Medical Management of Chronic Issues    Routine visit, blood sugar as of 7/21 331mg /dl,   . Health Maintenance    ophthalmology exam, urine micoralbumin, foot exam     HPI:  Pt is a 85 y.o. male seen today for medical management of chronic diseases.  And High BS  Patient has h/o Recurrent Aspiration Pneumonia due to Dysphagia He also has h/o Type 2 diabetes,Advanceddementia, hypertension, systolic  CHFEF of 61-95 %, depression, anemia, due to GI Bleed He also had Hypernatremia recently  Recently patient's blood sugar has been running more than 250 and 300.  This is new for patient and usually his sugars were less than 200. He also continues to have episodes of hypoxia due to aspiration.  Repeat chest x-rays have been negative for any pneumonia or CHF. Patient was comfortable today.  He is unable to give any history due to his advanced dementia.  Past Medical History:  Diagnosis Date  . Abnormality of gait   . Backache, unspecified   . CHF (congestive heart failure) ([place])   . Colon polyps    adenomatous  . Dementia ([place]) 11/12/2012  . Depression   . Diabetes mellitus without complication ([place])   . Diverticulosis   . Essential and other specified forms of tremor   . Hemorrhoids   . History of bilateral hip replacements 11/12/2012  . Memory loss   . 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 Pain in joint, pelvic region and thigh   . Skin cancer of scalp   . Spinal stenosis, lumbar region, without neurogenic claudication   . Unspecified hereditary and idiopathic peripheral neuropathy    Past Surgical History:  Procedure Laterality Date  . CATARACT EXTRACTION, BILATERAL  2012  . JOINT REPLACEMENT    . RETINAL LASER PROCEDURE  2012   retinal wrinkle  . SKIN CANCER EXCISION    . TOTAL HIP ARTHROPLASTY Left 2000  . TOTAL HIP ARTHROPLASTY (aka REPLACEMENT) Right 2011    Allergies  Allergen Reactions  . Axona [Bacid]  Other (See Comments)    Unknown per MAR  . Gabapentin Other (See Comments)    Hallucinations    Outpatient Encounter Medications as of 10/21/2018  Medication Sig  . albuterol (PROVENTIL) (2.5 MG/3ML) 0.083% nebulizer solution Take 3 mLs (2.5 mg total) by nebulization every 4 (four) hours as needed for wheezing or shortness of breath.  . AMINO ACIDS-PROTEIN HYDROLYS PO Take 30 mLs by mouth daily.  . benzonatate (TESSALON) 100  MG capsule Take 1 capsule (100 mg total) by mouth 3 (three) times daily.  Marland Kitchen buPROPion (WELLBUTRIN SR) 100 MG 12 hr tablet Take 100 mg by mouth daily.  . carvedilol (COREG) 6.25 MG tablet Take 6.25 mg by mouth 2 (two) times daily with a meal.  . Cholecalciferol (VITAMIN D) 1000 UNITS capsule Take 1,000 Units by mouth daily.    Marland Kitchen Dextromethorphan-guaiFENesin 10-100 MG/5ML SOLN Take 20 mLs by mouth every 4 (four) hours as needed.  . docusate sodium (COLACE) 100 MG capsule Take 100 mg by mouth daily.  . dorzolamide-timolol (COSOPT) 22.3-6.8 MG/ML ophthalmic solution Place 1 drop into the left eye 2 (two) times daily.   . Flaxseed, Linseed, (FLAXSEED OIL) 1000 MG CAPS Take 1,000 mg by mouth daily.    . folic acid (FOLVITE) 469 MCG tablet Take 800 mcg by mouth every evening.   . food thickener (THICK IT) POWD Take 1 g by mouth daily.  . furosemide (LASIX) 20 MG tablet Take 20 mg by mouth daily. On Mon, Wed,Fri  . ipratropium-albuterol (DUONEB) 0.5-2.5 (3) MG/3ML SOLN Take 3 mLs by nebulization 2 (two) times daily.  . memantine (NAMENDA) 10 MG tablet Take 10 mg by mouth 2 (two) times daily.  . Multiple Vitamin (MULTIVITAMIN) tablet Take 1 tablet by mouth daily.    Marland Kitchen nystatin (MYCOSTATIN/NYSTOP) powder Apply topically 2 (two) times daily. Apply Nystatin Powder to left intergluteal fold and buttocks MASD until redness resolved  . OVER THE COUNTER MEDICATION Apply 41 % topically as needed. Aquaphor Healing(white petrolatum) ointment apply to dry or red skin  . pantoprazole (PROTONIX) 40 MG tablet Take 1 tablet (40 mg total) by mouth daily.  Marland Kitchen zinc oxide 20 % ointment Apply 1 application topically as needed for irritation. To buttocks after every incontinent episode and as needed for redness  . [DISCONTINUED] acetaminophen (TYLENOL) 650 MG suppository Place 650 mg rectally every 4 (four) hours as needed.  . [DISCONTINUED] dextromethorphan-guaiFENesin (MUCINEX DM) 30-600 MG 12hr tablet Take 1 tablet by mouth  2 (two) times daily.  . [DISCONTINUED] metFORMIN (GLUCOPHAGE) 500 MG tablet Take 250 mg by mouth 2 (two) times daily with a meal.   . [DISCONTINUED] Probiotic Product (ALIGN) 4 MG CAPS Take 4 mg by mouth daily.   No facility-administered encounter medications on file as of 10/21/2018.     Review of Systems  Unable to perform ROS: Dementia    Immunization History  Administered Date(s) Administered  . Influenza-Unspecified 01/10/2017, 01/13/2018  . PPD Test 06/24/2016  . Pneumococcal-Unspecified 06/26/2012  . Td 12/02/2011  . Zoster Recombinat (Shingrix) 06/10/2017, 08/12/2017   Pertinent  Health Maintenance Due  Topic Date Due  . OPHTHALMOLOGY EXAM  06/06/1940  . URINE MICROALBUMIN  06/06/1940  . FOOT EXAM  08/07/2018  . INFLUENZA VACCINE  11/01/2018  . HEMOGLOBIN A1C  12/24/2018  . PNA vac Low Risk Adult  Completed   Fall Risk  12/13/2017 10/14/2017 12/07/2016 01/19/2016 07/21/2015  Falls in the past year? No No No Yes Yes  Number falls in past yr: - - -  1 1  Injury with Fall? - - - No No  Risk for fall due to : - Impaired balance/gait - Impaired balance/gait -  Follow up - - - Falls prevention discussed Falls prevention discussed   Functional Status Survey:    Vitals:   10/21/18 1156  BP: 102/60  Pulse: 88  Resp: (!) 24  Temp: (!) 97.3 F (36.3 C)  SpO2: 93%  Weight: 167 lb 12.8 oz (76.1 kg)  Height: 6' (1.829 m)   Body mass index is 22.76 kg/m. Physical Exam Vitals signs reviewed.  HENT:     Head: Normocephalic.     Nose: Nose normal.     Mouth/Throat:     Mouth: Mucous membranes are moist.     Pharynx: Oropharynx is clear.  Eyes:     Pupils: Pupils are equal, round, and reactive to light.  Neck:     Musculoskeletal: Neck supple.  Cardiovascular:     Rate and Rhythm: Normal rate and regular rhythm.     Pulses: Normal pulses.  Pulmonary:     Effort: Pulmonary effort is normal. No respiratory distress.     Breath sounds: Normal breath sounds. No wheezing  or rales.  Abdominal:     General: Abdomen is flat. Bowel sounds are normal.     Palpations: Abdomen is soft.  Musculoskeletal:        General: No swelling.  Skin:    General: Skin is warm and dry.  Neurological:     General: No focal deficit present.     Mental Status: He is alert.     Comments: Will respond and then gives you blank Stare  Psychiatric:        Mood and Affect: Mood normal.        Thought Content: Thought content normal.        Judgment: Judgment normal.     Labs reviewed: Recent Labs    04/26/18 0316  05/13/18 0251  05/19/18 0306  08/31/18 0547 09/01/18 0821 09/03/18 0301  09/18/18 09/22/18 09/30/18  NA 148*   < > 142   < > 150*   < > 134* 141 144   < > 151* 147 138  K 3.7   < > 4.1   < > 3.7   < > 4.6 3.9 3.5   < > 3.4 3.8 4.1  CL 115*   < > 115*   < > 117*   < > 99 106 110  --   --   --   --   CO2 26   < > 21*   < > 28   < > 22 23 24   --   --   --   --   GLUCOSE 168*   < > 104*   < > 152*   < > 354* 140* 196*  --   --   --   --   BUN 34*   < > 18   < > 20   < > 25* 17 20   < > 30* 20 14  CREATININE 1.32*   < > 1.00   < > 0.97   < > 1.06 0.77 0.76   < > 1.1 1.0 0.7  CALCIUM 8.2*   < > 7.8*   < > 8.0*   < > 8.8* 8.7* 9.0  --   --   --   --   MG 2.2  --  2.1  --  2.1  --   --   --   --   --   --   --   --  PHOS 3.3  --  3.1  --   --   --   --   --   --   --   --   --   --    < > = values in this interval not displayed.   Recent Labs    04/27/18 0321 05/01/18 0034 05/11/18 1626 05/13/18 0251 05/28/18  AST 44* 47* 37  --  12*  ALT 40 60* 27  --  13  ALKPHOS 91 111 86  --  96  BILITOT 0.5 0.6 0.5  --   --   PROT 5.5* 6.2* 5.4*  --   --   ALBUMIN 2.5* 2.6* 2.5* 2.0*  --    Recent Labs    05/13/18 0251 05/13/18 0505  08/30/18 2328 08/31/18 0547 09/01/18 0821 09/03/18 0301 09/16/18  WBC 7.8 8.2   < > 16.3* 11.6* 9.5 10.4 13.8  NEUTROABS 5.1 5.7  --  13.7*  --   --   --   --   HGB 5.5* 5.5*   < > 14.3 12.6* 12.6* 13.5 13.8  HCT 19.5* 19.1*   <  > 44.6 38.9* 38.3* 41.7 42  MCV 92.9 92.7   < > 94.3 93.1 93.4 95.2  --   PLT 130* 126*   < > 170 127* 131* 147* 105*   < > = values in this interval not displayed.   Lab Results  Component Value Date   TSH 2.61 08/08/2017   Lab Results  Component Value Date   HGBA1C 6.2 06/23/2018   Lab Results  Component Value Date   CHOL 158 06/23/2018   HDL 39 06/23/2018   LDLCALC 99 06/23/2018   TRIG 100 06/23/2018    Significant Diagnostic Results in last 30 days:  No results found.  Assessment/Plan Recurrent hypoxia with aspiration Continue honey thick diet Continue duo nebs twice daily Discussed with the nurses that they need to use duo nebs when he gets these episodes and start him on oxygen  Diabetes mellitus Blood sugars more than 300 We will start him on Tradjenta 5 mg daily Also increase his metformin to 500 mg twice daily CBG QAC and HS Cover withwith Sliding scale Not  candidate for aggressive measures Diastolic CHF On Low dose of Lasix  Hypernatremia- Plan: Sodium Much Better138 Repeat BMP  Adult failure to thrive - Plan: Has lost some more weight Worrisome that this will keep happening due to his Dementia, Aspiration   I Have had a long discussion with his wifebeforeAnd explained her that this is going to keep happening to him. At this time she still wants him to be transferred to the hospital if needed and does not want to make any other decisions.  Essential hypertension - Plan: On Low dose of Coreg Depression On Wellbutrin Dysphagia, unspecified type - Plan: Continue on Honey Thick Diet Continue to be Risk of Aspiration Stage 2 Pressure Ulcer Per wound care on Hydrogel and Nystatin [employer]. Dementia  Continue Supportive care and Namenda Thrombocytopenia New ?  Recheck CBC Family/ staff Communication:   Labs/tests ordered:  BMP,CBC, A1C   Total time spent in this patient care encounter was  45_  minutes; greater than 50% of  the visit spent counseling  staff, reviewing records , Labs and coordinating care for problems addressed at this encounter.

## 2018-10-23 DIAGNOSIS — I10 Essential (primary) hypertension: Secondary | ICD-10-CM | POA: Diagnosis not present

## 2018-10-24 ENCOUNTER — Encounter: Payer: Self-pay | Admitting: Internal Medicine

## 2018-10-24 ENCOUNTER — Non-Acute Institutional Stay (SKILLED_NURSING_FACILITY): Payer: Medicare Other | Admitting: Internal Medicine

## 2018-10-24 DIAGNOSIS — E870 Hyperosmolality and hypernatremia: Secondary | ICD-10-CM | POA: Diagnosis not present

## 2018-10-24 DIAGNOSIS — R1310 Dysphagia, unspecified: Secondary | ICD-10-CM | POA: Diagnosis not present

## 2018-10-24 DIAGNOSIS — E1122 Type 2 diabetes mellitus with diabetic chronic kidney disease: Secondary | ICD-10-CM

## 2018-10-24 DIAGNOSIS — I5042 Chronic combined systolic (congestive) and diastolic (congestive) heart failure: Secondary | ICD-10-CM | POA: Diagnosis not present

## 2018-10-24 NOTE — Progress Notes (Signed)
Location:  [facility] Room Number: 30/A Place of Service:  SNF (31) Provider:Feliza Diven L,MD   [name], MD  Patient Care Team: [name], MD as PCP - General (Internal Medicine) [name], MD as PCP - Cardiology (Cardiology) Mast, Man X, NP as Nurse Practitioner (Internal Medicine) [name], [name], NP as Nurse Practitioner (Family Medicine)  Extended Emergency Contact Information Primary Emergency Contact: [name] Address: [address], [place] Phone: [phone] Mobile Phone: [phone] Relation: Spouse Secondary Emergency Contact: [name] [place] of [place] Phone: [phone] Work Phone: [phone] Relation: Daughter  Code Status: DNR Goals of care: Advanced Directive information Advanced Directives 10/24/2018  Does Patient Have a Medical Advance Directive? Yes  Type of Advance Directive Living will;Healthcare Power of [place];Out of facility DNR (pink MOST or yellow form)  Does patient want to make changes to medical advance directive? No - Patient declined  Copy of [place] in Chart? Yes - validated most recent copy scanned in chart (See row information)  Would patient like information on creating a medical advance directive? No - Patient declined  Pre-existing out of facility DNR order (yellow form or pink MOST form) Yellow form placed in chart (order not valid for inpatient use)     Chief Complaint  Patient presents with  . Acute Visit    Hypernatremia, blood sugar as of 7/24 185mg /dl    HPI:  Pt is a 85 y.o. male seen today for an acute visit for Hypernatremia and D/W Family  Patient has h/o Recurrent Aspiration Pneumonia due to Dysphagia and Dementia He also has h/o Type 2 diabetes,Advanceddementia, hypertension, systolic CHFEF of 55-73 %, depression, anemia, due to GI Bleed  Hypernatremia This is a recurrent  problem for patient.  We have treated him with IV fluids before.  Today his sodium was 149.  BUN/creatinine were normal Aspiration due dysphagia and dementia Patient keeps getting these episodes of hypoxia due to aspiration. His recurrent chest x-rays have been negative for pneumonia. To prevent hospitalization we have been treating him with bronchodilators and oxygen as needed Diabetes mellitus Recently his sugars have been running more than 250 and 300 I increased his metformin to 500 mg twice daily and started him on Tradjenta and sliding scale insulin.  His repeat A1c was 7.8 Advanced dementia Patient continues to be fully dependent for his ADLs  Patient unable to give me any history due to his dementia.  With sitting comfortably and did not look like in any distress  Past Medical History:  Diagnosis Date  . Abnormality of gait   . Backache, unspecified   . CHF (congestive heart failure) ([place])   . Colon polyps    adenomatous  . Dementia ([place]) 11/12/2012  . Depression   . Diabetes mellitus without complication ([place])   . Diverticulosis   . Essential and other specified forms of tremor   . Hemorrhoids   . History of bilateral hip replacements 11/12/2012  . Memory loss   . 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 Pain in joint, pelvic region and thigh   . Skin cancer of scalp   . Spinal stenosis, lumbar region, without neurogenic claudication   . Unspecified hereditary and idiopathic peripheral neuropathy    Past Surgical History:  Procedure Laterality Date  . CATARACT EXTRACTION, BILATERAL  2012  . JOINT REPLACEMENT    .  RETINAL LASER PROCEDURE  2012   retinal wrinkle  . SKIN CANCER EXCISION    . TOTAL HIP ARTHROPLASTY Left 2000  . TOTAL HIP ARTHROPLASTY (aka REPLACEMENT) Right 2011    Allergies  Allergen Reactions  . Axona [Bacid] Other (See Comments)    Unknown per MAR  . Gabapentin Other (See Comments)    Hallucinations     Outpatient Encounter Medications as of 10/24/2018  Medication Sig  . albuterol (PROVENTIL) (2.5 MG/3ML) 0.083% nebulizer solution Take 3 mLs (2.5 mg total) by nebulization every 4 (four) hours as needed for wheezing or shortness of breath.  . AMINO ACIDS-PROTEIN HYDROLYS PO Take 30 mLs by mouth daily.  . benzonatate (TESSALON) 100 MG capsule Take 1 capsule (100 mg total) by mouth 3 (three) times daily.  Marland Kitchen buPROPion (WELLBUTRIN SR) 100 MG 12 hr tablet Take 100 mg by mouth daily.  . carvedilol (COREG) 6.25 MG tablet Take 6.25 mg by mouth 2 (two) times daily with a meal.  . Cholecalciferol (VITAMIN D) 1000 UNITS capsule Take 1,000 Units by mouth daily.    Marland Kitchen Dextromethorphan-guaiFENesin 10-100 MG/5ML SOLN Take 20 mLs by mouth every 4 (four) hours as needed.  . docusate sodium (COLACE) 100 MG capsule Take 100 mg by mouth daily.  . dorzolamide-timolol (COSOPT) 22.3-6.8 MG/ML ophthalmic solution Place 1 drop into the left eye 2 (two) times daily.   . Flaxseed, Linseed, (FLAXSEED OIL) 1000 MG CAPS Take 1,000 mg by mouth daily.    . folic acid (FOLVITE) 947 MCG tablet Take 800 mcg by mouth every evening.   . food thickener (THICK IT) POWD Take 1 g by mouth daily.  . furosemide (LASIX) 20 MG tablet Take 20 mg by mouth daily. On Mon, Wed,Fri  . insulin aspart (NOVOLOG) 100 UNIT/ML injection Inject into the skin 3 (three) times daily before meals.  Marland Kitchen ipratropium-albuterol (DUONEB) 0.5-2.5 (3) MG/3ML SOLN Take 3 mLs by nebulization 2 (two) times daily.  Marland Kitchen linagliptin (TRADJENTA) 5 MG TABS tablet Take 5 mg by mouth daily.  . memantine (NAMENDA) 10 MG tablet Take 10 mg by mouth 2 (two) times daily.  . metFORMIN (GLUCOPHAGE) 500 MG tablet Take 500 mg by mouth 2 (two) times daily with a meal.  . Multiple Vitamin (MULTIVITAMIN) tablet Take 1 tablet by mouth daily.    Marland Kitchen nystatin (MYCOSTATIN/NYSTOP) powder Apply topically 2 (two) times daily. Apply Nystatin Powder to left intergluteal fold and buttocks MASD until  redness resolved  . OVER THE COUNTER MEDICATION Apply 41 % topically as needed. Aquaphor Healing(white petrolatum) ointment apply to dry or red skin  . pantoprazole (PROTONIX) 40 MG tablet Take 1 tablet (40 mg total) by mouth daily.  . Probiotic Product (ALIGN) 4 MG CAPS Take 1 capsule by mouth.  . zinc oxide 20 % ointment Apply 1 application topically as needed for irritation. To buttocks after every incontinent episode and as needed for redness   No facility-administered encounter medications on file as of 10/24/2018.     Review of Systems  Unable to perform ROS: Dementia    Immunization History  Administered Date(s) Administered  . Influenza-Unspecified 01/10/2017, 01/13/2018  . PPD Test 06/24/2016  . Pneumococcal-Unspecified 06/26/2012  . Td 12/02/2011  . Zoster Recombinat (Shingrix) 06/10/2017, 08/12/2017   Pertinent  Health Maintenance Due  Topic Date Due  . OPHTHALMOLOGY EXAM  06/06/1940  . URINE MICROALBUMIN  06/06/1940  . FOOT EXAM  08/07/2018  . INFLUENZA VACCINE  11/01/2018  . HEMOGLOBIN A1C  12/24/2018  .  PNA vac Low Risk Adult  Completed   Fall Risk  12/13/2017 10/14/2017 12/07/2016 01/19/2016 07/21/2015  Falls in the past year? No No No Yes Yes  Number falls in past yr: - - - 1 1  Injury with Fall? - - - No No  Risk for fall due to : - Impaired balance/gait - Impaired balance/gait -  Follow up - - - Falls prevention discussed Falls prevention discussed   Functional Status Survey:    Vitals:   10/24/18 1440  BP: 102/60  Pulse: 88  Resp: (!) 24  Temp: 98 F (36.7 C)  SpO2: 93%  Weight: 169 lb 11.2 oz (77 kg)  Height: 6' (1.829 m)   Body mass index is 23.02 kg/m. Physical Exam Vitals signs reviewed.  Constitutional:      Appearance: Normal appearance.  HENT:     Head: Normocephalic.     Nose: Nose normal.     Mouth/Throat:     Mouth: Mucous membranes are dry.     Pharynx: Oropharynx is clear.  Eyes:     Pupils: Pupils are equal, round, and reactive  to light.  Neck:     Musculoskeletal: Neck supple.  Cardiovascular:     Rate and Rhythm: Normal rate and regular rhythm.     Pulses: Normal pulses.     Heart sounds: Normal heart sounds.  Pulmonary:     Effort: Pulmonary effort is normal. No respiratory distress.     Breath sounds: Normal breath sounds. No wheezing.  Abdominal:     General: Abdomen is flat. Bowel sounds are normal.     Palpations: Abdomen is soft.  Musculoskeletal:        General: No swelling.  Skin:    General: Skin is warm and dry.  Neurological:     General: No focal deficit present.     Mental Status: He is alert.  Psychiatric:        Mood and Affect: Mood normal.        Thought Content: Thought content normal.     Labs reviewed: Recent Labs    04/26/18 0316  05/13/18 0251  05/19/18 0306  08/31/18 0547 09/01/18 0821 09/03/18 0301  09/18/18 09/22/18 09/30/18  NA 148*   < > 142   < > 150*   < > 134* 141 144   < > 151* 147 138  K 3.7   < > 4.1   < > 3.7   < > 4.6 3.9 3.5   < > 3.4 3.8 4.1  CL 115*   < > 115*   < > 117*   < > 99 106 110  --   --   --   --   CO2 26   < > 21*   < > 28   < > 22 23 24   --   --   --   --   GLUCOSE 168*   < > 104*   < > 152*   < > 354* 140* 196*  --   --   --   --   BUN 34*   < > 18   < > 20   < > 25* 17 20   < > 30* 20 14  CREATININE 1.32*   < > 1.00   < > 0.97   < > 1.06 0.77 0.76   < > 1.1 1.0 0.7  CALCIUM 8.2*   < > 7.8*   < > 8.0*   < >  8.8* 8.7* 9.0  --   --   --   --   MG 2.2  --  2.1  --  2.1  --   --   --   --   --   --   --   --   PHOS 3.3  --  3.1  --   --   --   --   --   --   --   --   --   --    < > = values in this interval not displayed.   Recent Labs    04/27/18 0321 05/01/18 0034 05/11/18 1626 05/13/18 0251 05/28/18  AST 44* 47* 37  --  12*  ALT 40 60* 27  --  13  ALKPHOS 91 111 86  --  96  BILITOT 0.5 0.6 0.5  --   --   PROT 5.5* 6.2* 5.4*  --   --   ALBUMIN 2.5* 2.6* 2.5* 2.0*  --    Recent Labs    05/13/18 0251 05/13/18 0505  08/30/18  2328 08/31/18 0547 09/01/18 0821 09/03/18 0301 09/16/18  WBC 7.8 8.2   < > 16.3* 11.6* 9.5 10.4 13.8  NEUTROABS 5.1 5.7  --  13.7*  --   --   --   --   HGB 5.5* 5.5*   < > 14.3 12.6* 12.6* 13.5 13.8  HCT 19.5* 19.1*   < > 44.6 38.9* 38.3* 41.7 42  MCV 92.9 92.7   < > 94.3 93.1 93.4 95.2  --   PLT 130* 126*   < > 170 127* 131* 147* 105*   < > = values in this interval not displayed.   Lab Results  Component Value Date   TSH 2.61 08/08/2017   Lab Results  Component Value Date   HGBA1C 6.2 06/23/2018   Lab Results  Component Value Date   CHOL 158 06/23/2018   HDL 39 06/23/2018   LDLCALC 99 06/23/2018   TRIG 100 06/23/2018    Significant Diagnostic Results in last 30 days:  No results found.  Assessment/Plan Hypernatremia - Plan: Sodium 149 I discussed with his wife and daughter in the room At this time I do not feel comfortable doing recurrent IV fluids and I think is hypernatremia is linked to his dementia. I have also talked to the nurses and the dietitian We will increase his free water intake to 2000 cc divided into different times of day. Repeat BMP on Tuesday Hold his Lasix for now  Type 2 diabetes mellitus  Blood sugars more controlled right now A1C is 7.8  Started on Tradjenta and Sliding Scale insulin  Dysphagia with hypoxic episodes Continues on honey thick diet continues on DuoNeb twice daily D/W nurses to use duo nebs and start him on oxygen when he has these episodes  Have discussed again today with the wife that I do not think he is a candidate for any aggressive treatments and recurrent hospitalizations They want to talk to other family and have another meeting with me for his ACP   Chronic combined systolic and diastolic congestive heart failure ([place]) - Plan:  Hold his Lasix for now due to hypernatremia Follow up   Adult failure to thrive - Plan: Has lost some more weight Worrisome that this will keep happening due to his Dementia, Aspiration    Essential hypertension - Plan: On Low dose of Coreg Depression On Wellbutrin Stage 2 Pressure Ulcer Per wound care on Hydrogel  and Nystatin [employer]. Dementia  Continue Supportive care and Namenda Thrombocytopenia New ?  Recheck CBC  Family/ staff Communication:   Labs/tests ordered:  BMP and CBC  Total time spent in this patient care encounter was  45_  minutes; greater than 50% of the visit spent counseling patient and staff, reviewing records , Labs and coordinating care for problems addressed at this encounter.

## 2018-10-28 DIAGNOSIS — E870 Hyperosmolality and hypernatremia: Secondary | ICD-10-CM | POA: Diagnosis not present

## 2018-10-28 LAB — BASIC METABOLIC PANEL
BUN: 22 — AB (ref 4–21)
Creatinine: 0.9 (ref 0.6–1.3)
Glucose: 99
Potassium: 4.2 (ref 3.4–5.3)
Sodium: 146 (ref 137–147)

## 2018-11-04 ENCOUNTER — Encounter: Payer: Self-pay | Admitting: Internal Medicine

## 2018-11-04 ENCOUNTER — Non-Acute Institutional Stay (SKILLED_NURSING_FACILITY): Payer: Medicare Other | Admitting: Internal Medicine

## 2018-11-04 DIAGNOSIS — E1122 Type 2 diabetes mellitus with diabetic chronic kidney disease: Secondary | ICD-10-CM | POA: Diagnosis not present

## 2018-11-04 DIAGNOSIS — I5042 Chronic combined systolic (congestive) and diastolic (congestive) heart failure: Secondary | ICD-10-CM

## 2018-11-04 DIAGNOSIS — R1310 Dysphagia, unspecified: Secondary | ICD-10-CM | POA: Diagnosis not present

## 2018-11-04 DIAGNOSIS — E870 Hyperosmolality and hypernatremia: Secondary | ICD-10-CM

## 2018-11-04 NOTE — Progress Notes (Signed)
Location:  [place] [facility] Room Number: 30 Place of Service:  SNF (31) Provider:Gupta Rene Kocher MD   Jose Dad, MD  Patient Care Team: Jose Dad, MD as PCP - General (Internal Medicine) Jose Hector, MD as PCP - Cardiology (Cardiology) Jose [name], Man X, NP as Nurse Practitioner (Internal Medicine) Jose [name], Jose Bucks, NP as Nurse Practitioner (Family Medicine)  Extended Emergency Contact Information Primary Emergency Contact: Jose [name] Address: [address], [place] Phone: [phone] Mobile Phone: [phone] Relation: Spouse Secondary Emergency Contact: [name] [place] Phone: [phone] Work Phone: [phone] Relation: Daughter  Code Status: DNR  Goals of care: Advanced Directive information Advanced Directives 11/04/2018  Does Patient Have a Medical Advance Directive? Yes  Type of Advance Directive Living will;Healthcare Power of Attorney  Does patient want to make changes to medical advance directive? No - Patient declined  Copy of [place] in Chart? Yes - validated most recent copy scanned in chart (See row information)  Would patient like information on creating a medical advance directive? No - Patient declined  Pre-existing out of facility DNR order (yellow form or pink MOST form) -     Chief Complaint  Patient presents with  . Acute Visit    Medication review     HPI:  Pt is a 85 y.o. male seen today for an acute visit for medication review and management of his diabetes and hyponatremia  Patienthas h/o Recurrent Aspiration Pneumonia due to Dysphagia and Dementia with Hypernatremia He also has h/o Type 2 diabetes,Advanceddementia, hypertension, systolic CHFEF of 00-34 %, depression, anemia, due to GI Bleed Patient unable to give any history due to his dementia. His sugars were running between 250 and 300 and he was started on  increased dose of metformin and Tradjenta.  Since then his sugars have been now less than 200.  His last A1c was 7.8  He is on Wellbutrin for his depression.  But nurses are unable to crush it and he cannot swallow.  He also continues to have episodes of hypoxia due to aspiration.  Recurrent chest x-rays have been negative for pneumonia   Past Medical History:  Diagnosis Date  . Abnormality of gait   . Backache, unspecified   . CHF (congestive heart failure) ([place])   . Colon polyps    adenomatous  . Dementia ([place]) 11/12/2012  . Depression   . Diabetes mellitus without complication ([place])   . Diverticulosis   . Essential and other specified forms of tremor   . Hemorrhoids   . History of bilateral hip replacements 11/12/2012  . Memory loss   . 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 Pain in joint, pelvic region and thigh   . Skin cancer of scalp   . Spinal stenosis, lumbar region, without neurogenic claudication   . Unspecified hereditary and idiopathic peripheral neuropathy    Past Surgical History:  Procedure Laterality Date  . CATARACT EXTRACTION, BILATERAL  2012  . JOINT REPLACEMENT    . RETINAL LASER PROCEDURE  2012   retinal wrinkle  . SKIN CANCER EXCISION    . TOTAL HIP ARTHROPLASTY Left 2000  . TOTAL HIP ARTHROPLASTY (aka REPLACEMENT) Right 2011    Allergies  Allergen Reactions  . Axona [Bacid] Other (See Comments)    Unknown per MAR  . Gabapentin Other (See Comments)  Hallucinations    Outpatient Encounter Medications as of 11/04/2018  Medication Sig  . albuterol (PROVENTIL) (2.5 MG/3ML) 0.083% nebulizer solution Take 3 mLs (2.5 mg total) by nebulization every 4 (four) hours as needed for wheezing or shortness of breath.  . AMINO ACIDS-PROTEIN HYDROLYS PO Take 30 mLs by mouth daily.  . benzonatate (TESSALON) 100 MG capsule Take 1 capsule (100 mg total) by mouth 3 (three) times daily.  Marland Kitchen buPROPion (WELLBUTRIN SR) 100 MG 12 hr  tablet Take 100 mg by mouth daily.  . carvedilol (COREG) 6.25 MG tablet Take 6.25 mg by mouth 2 (two) times daily with a meal.  . Cholecalciferol (VITAMIN D) 1000 UNITS capsule Take 1,000 Units by mouth daily.    Marland Kitchen Dextromethorphan-guaiFENesin 10-100 MG/5ML SOLN Take 20 mLs by mouth every 4 (four) hours as needed.  . docusate sodium (COLACE) 100 MG capsule Take 100 mg by mouth daily.  . dorzolamide-timolol (COSOPT) 22.3-6.8 MG/ML ophthalmic solution Place 1 drop into the left eye 2 (two) times daily.   . Flaxseed, Linseed, (FLAXSEED OIL) 1000 MG CAPS Take 1,000 mg by mouth daily.    . folic acid (FOLVITE) 563 MCG tablet Take 800 mcg by mouth every evening.   . food thickener (THICK IT) POWD Take 1 g by mouth daily.  . furosemide (LASIX) 20 MG tablet Take 20 mg by mouth daily. On Mon, Wed,Fri  . ipratropium-albuterol (DUONEB) 0.5-2.5 (3) MG/3ML SOLN Take 3 mLs by nebulization 2 (two) times daily.  Marland Kitchen linagliptin (TRADJENTA) 5 MG TABS tablet Take 5 mg by mouth daily.  . memantine (NAMENDA) 10 MG tablet Take 10 mg by mouth 2 (two) times daily.  . metFORMIN (GLUCOPHAGE) 500 MG tablet Take 500 mg by mouth 2 (two) times daily with a meal.  . Multiple Vitamin (MULTIVITAMIN) tablet Take 1 tablet by mouth daily.    Marland Kitchen nystatin (MYCOSTATIN/NYSTOP) powder Apply topically 2 (two) times daily. Apply Nystatin Powder to left intergluteal fold and buttocks MASD until redness resolved  . OVER THE COUNTER MEDICATION Apply 41 % topically as needed. Aquaphor Healing(white petrolatum) ointment apply to dry or red skin  . pantoprazole (PROTONIX) 40 MG tablet Take 1 tablet (40 mg total) by mouth daily.  . Probiotic Product (ALIGN) 4 MG CAPS Take 1 capsule by mouth.  . zinc oxide 20 % ointment Apply 1 application topically as needed for irritation. To buttocks after every incontinent episode and as needed for redness  . [DISCONTINUED] insulin aspart (NOVOLOG) 100 UNIT/ML injection Inject into the skin 3 (three) times  daily before meals.   No facility-administered encounter medications on file as of 11/04/2018.     Review of Systems  Unable to perform ROS: Dementia    Immunization History  Administered Date(s) Administered  . Influenza-Unspecified 01/10/2017, 01/13/2018  . PPD Test 06/24/2016  . Pneumococcal-Unspecified 06/26/2012  . Td 12/02/2011  . Zoster Recombinat (Shingrix) 06/10/2017, 08/12/2017   Pertinent  Health Maintenance Due  Topic Date Due  . OPHTHALMOLOGY EXAM  06/06/1940  . URINE MICROALBUMIN  06/06/1940  . FOOT EXAM  08/07/2018  . INFLUENZA VACCINE  11/01/2018  . HEMOGLOBIN A1C  12/24/2018  . PNA vac Low Risk Adult  Completed   Fall Risk  12/13/2017 10/14/2017 12/07/2016 01/19/2016 07/21/2015  Falls in the past year? No No No Yes Yes  Number falls in past yr: - - - 1 1  Injury with Fall? - - - No No  Risk for fall due to : - Impaired balance/gait - Impaired  balance/gait -  Follow up - - - Falls prevention discussed Falls prevention discussed   Functional Status Survey:    Vitals:   11/04/18 0913  BP: 110/60  Pulse: 76  Resp: (!) 24  Temp: 98 F (36.7 C)  SpO2: 98%  Weight: 168 lb 4.8 oz (76.3 kg)  Height: 6' (1.829 m)   Body mass index is 22.83 kg/m. Physical Exam HENT:     Head: Normocephalic.     Nose: Nose normal.     Mouth/Throat:     Mouth: Mucous membranes are moist.     Pharynx: Oropharynx is clear.  Eyes:     Pupils: Pupils are equal, round, and reactive to light.  Neck:     Musculoskeletal: Neck supple.  Cardiovascular:     Rate and Rhythm: Normal rate and regular rhythm.     Pulses: Normal pulses.     Heart sounds: Normal heart sounds.  Pulmonary:     Effort: Pulmonary effort is normal.     Breath sounds: Normal breath sounds.  Abdominal:     General: Abdomen is flat. Bowel sounds are normal.     Palpations: Abdomen is soft.  Musculoskeletal:        General: No swelling.  Skin:    General: Skin is warm and dry.  Neurological:      General: No focal deficit present.     Mental Status: He is alert.     Comments: Alert and respond but does not follow any commands  Psychiatric:        Mood and Affect: Mood normal.        Thought Content: Thought content normal.     Labs reviewed: Recent Labs    04/26/18 0316  05/13/18 0251  05/19/18 0306  08/31/18 0547 09/01/18 0821 09/03/18 0301  09/22/18 09/30/18 10/28/18  NA 148*   < > 142   < > 150*   < > 134* 141 144   < > 147 138 146  K 3.7   < > 4.1   < > 3.7   < > 4.6 3.9 3.5   < > 3.8 4.1 4.2  CL 115*   < > 115*   < > 117*   < > 99 106 110  --   --   --   --   CO2 26   < > 21*   < > 28   < > 22 23 24   --   --   --   --   GLUCOSE 168*   < > 104*   < > 152*   < > 354* 140* 196*  --   --   --   --   BUN 34*   < > 18   < > 20   < > 25* 17 20   < > 20 14 22*  CREATININE 1.32*   < > 1.00   < > 0.97   < > 1.06 0.77 0.76   < > 1.0 0.7 0.9  CALCIUM 8.2*   < > 7.8*   < > 8.0*   < > 8.8* 8.7* 9.0  --   --   --   --   MG 2.2  --  2.1  --  2.1  --   --   --   --   --   --   --   --   PHOS 3.3  --  3.1  --   --   --   --   --   --   --   --   --   --    < > =  values in this interval not displayed.   Recent Labs    04/27/18 0321 05/01/18 0034 05/11/18 1626 05/13/18 0251 05/28/18  AST 44* 47* 37  --  12*  ALT 40 60* 27  --  13  ALKPHOS 91 111 86  --  96  BILITOT 0.5 0.6 0.5  --   --   PROT 5.5* 6.2* 5.4*  --   --   ALBUMIN 2.5* 2.6* 2.5* 2.0*  --    Recent Labs    05/13/18 0251 05/13/18 0505  08/30/18 2328 08/31/18 0547 09/01/18 0821 09/03/18 0301 09/16/18  WBC 7.8 8.2   < > 16.3* 11.6* 9.5 10.4 13.8  NEUTROABS 5.1 5.7  --  13.7*  --   --   --   --   HGB 5.5* 5.5*   < > 14.3 12.6* 12.6* 13.5 13.8  HCT 19.5* 19.1*   < > 44.6 38.9* 38.3* 41.7 42  MCV 92.9 92.7   < > 94.3 93.1 93.4 95.2  --   PLT 130* 126*   < > 170 127* 131* 147* 105*   < > = values in this interval not displayed.   Lab Results  Component Value Date   TSH 2.61 08/08/2017   Lab Results   Component Value Date   HGBA1C 6.2 06/23/2018   Lab Results  Component Value Date   CHOL 158 06/23/2018   HDL 39 06/23/2018   LDLCALC 99 06/23/2018   TRIG 100 06/23/2018    Significant Diagnostic Results in last 30 days:  No results found.  Assessment/Plan Dysphagia, unspecified type - Plan:  Patient unable to swallow Wellbutrin SR Will change it to immediate Release which can be crushed Continue Neb Treatment for his  Episodes of Aspiration  Continues to be high risk for Pneumonia  Chronic combined systolic and diastolic CHF- Plan:  Weight is stable on Lasix low dose  Hypernatremia - Plan:  Last sodium was 146 Continue Water supplement Have d/w wife that we want to avoid IV fluids and reason for his hypernatremia is combination of dysphagia and dementia  Type 2 diabetes mellitus with diabetic chronic kidney disease,  His blood sugars are well controlled now on metformin and Tradjenta Most of his sugars are less than 200 We will discontinue sliding scale Change CBGs to every morning Essential hypertension - Plan: On Low dose of Coreg Depression On Wellbutrin Stage 2 Pressure Ulcer Per wound care on Hydrogel and Nystatin Air [place]. Dementia  Continue Supportive care and Namenda    Family/ staff Communication:   Labs/tests ordered:  BMP in 2 weeks

## 2018-11-11 ENCOUNTER — Non-Acute Institutional Stay (SKILLED_NURSING_FACILITY): Payer: Medicare Other | Admitting: Internal Medicine

## 2018-11-11 ENCOUNTER — Encounter: Payer: Self-pay | Admitting: Internal Medicine

## 2018-11-11 DIAGNOSIS — F339 Major depressive disorder, recurrent, unspecified: Secondary | ICD-10-CM

## 2018-11-11 DIAGNOSIS — E870 Hyperosmolality and hypernatremia: Secondary | ICD-10-CM

## 2018-11-11 DIAGNOSIS — I5042 Chronic combined systolic (congestive) and diastolic (congestive) heart failure: Secondary | ICD-10-CM

## 2018-11-11 DIAGNOSIS — R1310 Dysphagia, unspecified: Secondary | ICD-10-CM | POA: Diagnosis not present

## 2018-11-11 MED ORDER — LORAZEPAM 0.5 MG PO TABS: 0.2500 mg | ORAL_TABLET | Freq: Four times a day (QID) | ORAL | 0 refills | Status: DC | PRN

## 2018-11-11 NOTE — Progress Notes (Signed)
Location:  [facility] Room Number: 30 Place of Service:  SNF (31) Provider:Tamarah Bhullar L,MD   [name], MD  Patient Care Team: [name], MD as PCP - General (Internal Medicine) [name], MD as PCP - Cardiology (Cardiology) Mast, Man X, NP as Nurse Practitioner (Internal Medicine) [name], [name], NP as Nurse Practitioner (Family Medicine)  Extended Emergency Contact Information Primary Emergency Contact: [name] Address: [address], [place] of [place] Phone: [phone] Mobile Phone: [phone] Relation: Spouse Secondary Emergency Contact: [name] [place] of [place] Phone: [phone] Work Phone: [phone] Relation: Daughter  Code Status: DNR  Goals of care: Advanced Directive information Advanced Directives 11/11/2018  Does Patient Have a Medical Advance Directive? Yes  Type of Paramedic of [place];Living will  Does patient want to make changes to medical advance directive? No - Patient declined  Copy of [place] in Chart? Yes - validated most recent copy scanned in chart (See row information)  Would patient like information on creating a medical advance directive? No - Patient declined  Pre-existing out of facility DNR order (yellow form or pink MOST form) -     Chief Complaint  Patient presents with  . Acute Visit    SOB, Blood sugar as of  276mg /dl    HPI:  Pt is a 85 y.o. male seen today for an acute visit for Continuous to have episodes of SOB and Distress. Usually resolved with Nebs Also Family is agreeable to discontinue some of his Meds  Patienthas h/o Recurrent Aspiration Pneumonia due to Dysphagiaand Dementia with Hypernatremia He also has h/o Type 2 diabetes,Advanceddementia, hypertension, systolic CHFEF of 51-02%, depression, anemia, due to GI Bleed Patient unable to give any history due to  his dementia. Nurses concerned that patient is having distressed when he has these episodes in which he gets hypoxic due to aspiration.  It was discussed with his wife and she is finally agreed that we can try some Ativan.  She does not want hospice involved. She still wants him to be transferred to the hospital if hypoxia is not better nebs She has also agreed for [place] to discontinue some of his supplements and want him to continue Wellbutrin and Namenda for now  Past Medical History:  Diagnosis Date  . Abnormality of gait   . Backache, unspecified   . CHF (congestive heart failure) ([place])   . Colon polyps    adenomatous  . Dementia ([place]) 11/12/2012  . Depression   . Diabetes mellitus without complication ([place])   . Diverticulosis   . Essential and other specified forms of tremor   . Hemorrhoids   . History of bilateral hip replacements 11/12/2012  . Memory loss   . 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 Pain in joint, pelvic region and thigh   . Skin cancer of scalp   . Spinal stenosis, lumbar region, without neurogenic claudication   . Unspecified hereditary and idiopathic peripheral neuropathy    Past Surgical History:  Procedure Laterality Date  . CATARACT EXTRACTION, BILATERAL  2012  . JOINT REPLACEMENT    . RETINAL LASER PROCEDURE  2012   retinal wrinkle  . SKIN CANCER EXCISION    . TOTAL HIP ARTHROPLASTY Left 2000  . TOTAL HIP ARTHROPLASTY (aka REPLACEMENT) Right 2011    Allergies  Allergen Reactions  .  Axona [Bacid] Other (See Comments)    Unknown per MAR  . Gabapentin Other (See Comments)    Hallucinations    Outpatient Encounter Medications as of 11/11/2018  Medication Sig  . albuterol (PROVENTIL) (2.5 MG/3ML) 0.083% nebulizer solution Take 3 mLs (2.5 mg total) by nebulization every 4 (four) hours as needed for wheezing or shortness of breath.  . AMINO ACIDS-PROTEIN HYDROLYS PO Take 30 mLs by mouth daily.  . benzonatate  (TESSALON) 100 MG capsule Take 1 capsule (100 mg total) by mouth 3 (three) times daily.  Marland Kitchen buPROPion (WELLBUTRIN SR) 100 MG 12 hr tablet Take 100 mg by mouth daily.  . carvedilol (COREG) 6.25 MG tablet Take 6.25 mg by mouth 2 (two) times daily with a meal.  . Cholecalciferol (VITAMIN D) 1000 UNITS capsule Take 1,000 Units by mouth daily.    Marland Kitchen Dextromethorphan-guaiFENesin 10-100 MG/5ML SOLN Take 20 mLs by mouth every 4 (four) hours as needed.  . docusate sodium (COLACE) 100 MG capsule Take 100 mg by mouth daily.  . dorzolamide-timolol (COSOPT) 22.3-6.8 MG/ML ophthalmic solution Place 1 drop into the left eye 2 (two) times daily.   . Flaxseed, Linseed, (FLAXSEED OIL) 1000 MG CAPS Take 1,000 mg by mouth daily.    . folic acid (FOLVITE) 737 MCG tablet Take 800 mcg by mouth every evening.   . food thickener (THICK IT) POWD Take 1 g by mouth daily.  . furosemide (LASIX) 20 MG tablet Take 20 mg by mouth daily. On Mon, Wed,Fri  . ipratropium-albuterol (DUONEB) 0.5-2.5 (3) MG/3ML SOLN Take 3 mLs by nebulization 2 (two) times daily.  Marland Kitchen linagliptin (TRADJENTA) 5 MG TABS tablet Take 5 mg by mouth daily.  . memantine (NAMENDA) 10 MG tablet Take 10 mg by mouth 2 (two) times daily.  . metFORMIN (GLUCOPHAGE) 500 MG tablet Take 500 mg by mouth 2 (two) times daily with a meal.  . Multiple Vitamin (MULTIVITAMIN) tablet Take 1 tablet by mouth daily.    Marland Kitchen nystatin (MYCOSTATIN/NYSTOP) powder Apply topically 2 (two) times daily. Apply Nystatin Powder to left intergluteal fold and buttocks MASD until redness resolved  . OVER THE COUNTER MEDICATION Apply 41 % topically as needed. Aquaphor Healing(white petrolatum) ointment apply to dry or red skin  . pantoprazole (PROTONIX) 40 MG tablet Take 1 tablet (40 mg total) by mouth daily.  . Probiotic Product (ALIGN) 4 MG CAPS Take 1 capsule by mouth.  . zinc oxide 20 % ointment Apply 1 application topically as needed for irritation. To buttocks after every incontinent episode  and as needed for redness   No facility-administered encounter medications on file as of 11/11/2018.     Review of Systems  Unable to perform ROS: Dementia    Immunization History  Administered Date(s) Administered  . Influenza-Unspecified 01/10/2017, 01/13/2018  . PPD Test 06/24/2016  . Pneumococcal-Unspecified 06/26/2012  . Td 12/02/2011  . Zoster Recombinat (Shingrix) 06/10/2017, 08/12/2017   Pertinent  Health Maintenance Due  Topic Date Due  . OPHTHALMOLOGY EXAM  06/06/1940  . URINE MICROALBUMIN  06/06/1940  . FOOT EXAM  08/07/2018  . INFLUENZA VACCINE  11/01/2018  . HEMOGLOBIN A1C  12/24/2018  . PNA vac Low Risk Adult  Completed   Fall Risk  12/13/2017 10/14/2017 12/07/2016 01/19/2016 07/21/2015  Falls in the past year? No No No Yes Yes  Number falls in past yr: - - - 1 1  Injury with Fall? - - - No No  Risk for fall due to : - Impaired balance/gait -  Impaired balance/gait -  Follow up - - - Falls prevention discussed Falls prevention discussed   Functional Status Survey:    Vitals:   11/11/18 1506  BP: 126/60  Pulse: 70  Resp: (!) 24  Temp: (!) 97.5 F (36.4 C)  SpO2: 91%  Weight: 168 lb 3.2 oz (76.3 kg)  Height: 6' (1.829 m)   Body mass index is 22.81 kg/m. Physical Exam Vitals signs reviewed.  Constitutional:      Appearance: Normal appearance.  HENT:     Head: Normocephalic.     Nose: Nose normal.     Mouth/Throat:     Mouth: Mucous membranes are moist.     Pharynx: Oropharynx is clear.  Eyes:     Pupils: Pupils are equal, round, and reactive to light.  Neck:     Musculoskeletal: Neck supple.  Cardiovascular:     Rate and Rhythm: Normal rate and regular rhythm.     Pulses: Normal pulses.  Pulmonary:     Effort: Pulmonary effort is normal.     Breath sounds: Wheezing present. No rales.     Comments: Some wheezing Abdominal:     General: Abdomen is flat. Bowel sounds are normal.     Palpations: Abdomen is soft.  Musculoskeletal:         General: No swelling.  Skin:    General: Skin is warm and dry.  Neurological:     Mental Status: He is alert. Mental status is at baseline.  Psychiatric:        Mood and Affect: Mood normal.        Behavior: Behavior normal.     Labs reviewed: Recent Labs    04/26/18 0316  05/13/18 0251  05/19/18 0306  08/31/18 0547 09/01/18 0821 09/03/18 0301  09/22/18 09/30/18 10/28/18  NA 148*   < > 142   < > 150*   < > 134* 141 144   < > 147 138 146  K 3.7   < > 4.1   < > 3.7   < > 4.6 3.9 3.5   < > 3.8 4.1 4.2  CL 115*   < > 115*   < > 117*   < > 99 106 110  --   --   --   --   CO2 26   < > 21*   < > 28   < > 22 23 24   --   --   --   --   GLUCOSE 168*   < > 104*   < > 152*   < > 354* 140* 196*  --   --   --   --   BUN 34*   < > 18   < > 20   < > 25* 17 20   < > 20 14 22*  CREATININE 1.32*   < > 1.00   < > 0.97   < > 1.06 0.77 0.76   < > 1.0 0.7 0.9  CALCIUM 8.2*   < > 7.8*   < > 8.0*   < > 8.8* 8.7* 9.0  --   --   --   --   MG 2.2  --  2.1  --  2.1  --   --   --   --   --   --   --   --   PHOS 3.3  --  3.1  --   --   --   --   --   --   --   --   --   --    < > =  values in this interval not displayed.   Recent Labs    04/27/18 0321 05/01/18 0034 05/11/18 1626 05/13/18 0251 05/28/18  AST 44* 47* 37  --  12*  ALT 40 60* 27  --  13  ALKPHOS 91 111 86  --  96  BILITOT 0.5 0.6 0.5  --   --   PROT 5.5* 6.2* 5.4*  --   --   ALBUMIN 2.5* 2.6* 2.5* 2.0*  --    Recent Labs    05/13/18 0251 05/13/18 0505  08/30/18 2328 08/31/18 0547 09/01/18 0821 09/03/18 0301 09/16/18  WBC 7.8 8.2   < > 16.3* 11.6* 9.5 10.4 13.8  NEUTROABS 5.1 5.7  --  13.7*  --   --   --   --   HGB 5.5* 5.5*   < > 14.3 12.6* 12.6* 13.5 13.8  HCT 19.5* 19.1*   < > 44.6 38.9* 38.3* 41.7 42  MCV 92.9 92.7   < > 94.3 93.1 93.4 95.2  --   PLT 130* 126*   < > 170 127* 131* 147* 105*   < > = values in this interval not displayed.   Lab Results  Component Value Date   TSH 2.61 08/08/2017   Lab Results  Component  Value Date   HGBA1C 6.2 06/23/2018   Lab Results  Component Value Date   CHOL 158 06/23/2018   HDL 39 06/23/2018   LDLCALC 99 06/23/2018   TRIG 100 06/23/2018    Significant Diagnostic Results in last 30 days:  No results found.  Assessment/Plan  Dysphagia with episodes of aspiration Will continue nebs for his hypoxia We will start him on Ativan .25 mg every 6 hours as needed for distress Wife does not want hospice referral Continues to be high risk for pneumonia Will discontinue his supplements She still wants him on Namenda and Wellbutrin  Other issues Chronic combined systolic and diastolic CHF- Plan:  Weight is stable on Lasix low dose  Hypernatremia - Plan:  Last sodium was 146 Continue Water supplement Have d/w wife that we want to avoid IV fluids and reason for his hypernatremia is combination of dysphagia and dementia  Type 2 diabetes mellitus with diabetic chronic kidney disease,  His blood sugars are well controlled now on metformin and Tradjenta Most of his sugars are less than 200  Essential hypertension - Plan: On Low dose of Coreg Depression On Wellbutrin Stage 2 Pressure Ulcer Per wound care on Hydrogel and Nystatin Air [place]. Dementia  Continue Supportive care and Namenda     Family/ staff Communication:   Labs/tests ordered:   Total time spent in this patient care encounter was  25_  minutes; greater than 50% of the visit spent counseling patient and staff, reviewing records , Labs and coordinating care for problems addressed at this encounter.

## 2018-11-18 DIAGNOSIS — N182 Chronic kidney disease, stage 2 (mild): Secondary | ICD-10-CM | POA: Diagnosis not present

## 2018-11-18 DIAGNOSIS — E870 Hyperosmolality and hypernatremia: Secondary | ICD-10-CM | POA: Diagnosis not present

## 2018-11-18 DIAGNOSIS — E119 Type 2 diabetes mellitus without complications: Secondary | ICD-10-CM | POA: Diagnosis not present

## 2018-11-18 LAB — BASIC METABOLIC PANEL
BUN: 20 (ref 4–21)
Creatinine: 0.8 (ref 0.6–1.3)
Glucose: 123
Potassium: 4 (ref 3.4–5.3)
Sodium: 141 (ref 137–147)

## 2018-11-19 ENCOUNTER — Encounter: Payer: Self-pay | Admitting: Nurse Practitioner

## 2018-11-19 ENCOUNTER — Non-Acute Institutional Stay (SKILLED_NURSING_FACILITY): Payer: Medicare Other | Admitting: Nurse Practitioner

## 2018-11-19 ENCOUNTER — Other Ambulatory Visit: Payer: Self-pay | Admitting: *Deleted

## 2018-11-19 DIAGNOSIS — G301 Alzheimer's disease with late onset: Secondary | ICD-10-CM

## 2018-11-19 DIAGNOSIS — R0902 Hypoxemia: Secondary | ICD-10-CM

## 2018-11-19 DIAGNOSIS — I10 Essential (primary) hypertension: Secondary | ICD-10-CM | POA: Diagnosis not present

## 2018-11-19 DIAGNOSIS — I5042 Chronic combined systolic (congestive) and diastolic (congestive) heart failure: Secondary | ICD-10-CM

## 2018-11-19 DIAGNOSIS — E1122 Type 2 diabetes mellitus with diabetic chronic kidney disease: Secondary | ICD-10-CM | POA: Diagnosis not present

## 2018-11-19 DIAGNOSIS — F418 Other specified anxiety disorders: Secondary | ICD-10-CM

## 2018-11-19 DIAGNOSIS — R1310 Dysphagia, unspecified: Secondary | ICD-10-CM | POA: Diagnosis not present

## 2018-11-19 DIAGNOSIS — K219 Gastro-esophageal reflux disease without esophagitis: Secondary | ICD-10-CM

## 2018-11-19 DIAGNOSIS — F0280 Dementia in other diseases classified elsewhere without behavioral disturbance: Secondary | ICD-10-CM

## 2018-11-19 LAB — BASIC METABOLIC PANEL
Calcium: 8.9
Carbon Dioxide, Total: 29
Chloride: 102
EGFR (Non-African Amer.): 80

## 2018-11-19 NOTE — Assessment & Plan Note (Signed)
Blood pressure is controlled, continue Carvedilol 6.25mg  bid

## 2018-11-19 NOTE — Assessment & Plan Note (Signed)
stable, continue DuoNeb bid, Albuterol neb q4h prn, O2.

## 2018-11-19 NOTE — Assessment & Plan Note (Signed)
Diet modification, risk for aspiration

## 2018-11-19 NOTE — Assessment & Plan Note (Signed)
Stable, continue Omeprazole 40mg  po qd

## 2018-11-19 NOTE — Assessment & Plan Note (Signed)
Compensated, continue Furosemide 20mg qd.  

## 2018-11-19 NOTE — Assessment & Plan Note (Signed)
Blood sugar is controlled, continue Metfromin, Tradjenta. Last Hgb a1c 6.2 06/23/18

## 2018-11-19 NOTE — Assessment & Plan Note (Signed)
His mood is stable, continue Wellbutrin 100mg  qd, Lorazepam pan

## 2018-11-19 NOTE — Progress Notes (Signed)
Location:  [facility] Room Number: 30 Place of Service:  SNF (31) Provider:  Marlana Latus  NP  [name], MD  Patient Care Team: [name], MD as PCP - General (Internal Medicine) [name], MD as PCP - Cardiology (Cardiology) Mialynn Shelvin X, NP as Nurse Practitioner (Internal Medicine) [name], [name], NP as Nurse Practitioner (Family Medicine)  Extended Emergency Contact Information Primary Emergency Contact: [name] Address: [place], [place] Phone: [phone] Mobile Phone: [phone] Relation: Spouse Secondary Emergency Contact: [name] [place] of [place] Phone: [phone] Work Phone: [phone] Relation: Daughter  Code Status:  DNR Goals of care: Advanced Directive information Advanced Directives 11/11/2018  Does Patient Have a Medical Advance Directive? Yes  Type of Paramedic of [place];Living will  Does patient want to make changes to medical advance directive? No - Patient declined  Copy of [place] in Chart? Yes - validated most recent copy scanned in chart (See row information)  Would patient like information on creating a medical advance directive? No - Patient declined  Pre-existing out of facility DNR order (yellow form or pink MOST form) -     Chief Complaint  Patient presents with  . Medical Management of Chronic Issues    HPI:  Pt is a 85 y.o. male seen today for medical management of chronic diseases.    The patient resides in SNF [facility] for safety and care assistance, taking Memantine 10mg  bid for memory. His mood is stable on Wellbutrin 100mg  qd, Lorazepam 0.125mg  q6h prn. GERD, stable, on Omeprazole 40mg  qd. T2DM, blood sugar is controlled, on Metformin 500mg  bid, Tradjenta 5mg  qd. Chronic hypoxemia, stable, on DuoNeb bid, Albuterol neb q4h prn, O2.  CHF, compensated, on Furosemide 20mg  qd.  HTN, blood pressure is controlled on Carvedilol 6.25mg  bid.     Past Medical History:  Diagnosis Date  . Abnormality of gait   . Backache, unspecified   . CHF (congestive heart failure) ([place])   . Colon polyps    adenomatous  . Dementia ([place]) 11/12/2012  . Depression   . Diabetes mellitus without complication ([place])   . Diverticulosis   . Essential and other specified forms of tremor   . Hemorrhoids   . History of bilateral hip replacements 11/12/2012  . Memory loss   . 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 Pain in joint, pelvic region and thigh   . Skin cancer of scalp   . Spinal stenosis, lumbar region, without neurogenic claudication   . Unspecified hereditary and idiopathic peripheral neuropathy    Past Surgical History:  Procedure Laterality Date  . CATARACT EXTRACTION, BILATERAL  2012  . JOINT REPLACEMENT    . RETINAL LASER PROCEDURE  2012   retinal wrinkle  . SKIN CANCER EXCISION    . TOTAL HIP ARTHROPLASTY Left 2000  . TOTAL HIP ARTHROPLASTY (aka REPLACEMENT) Right 2011    Allergies  Allergen Reactions  . Axona [Bacid] Other (See Comments)    Unknown per MAR  . Gabapentin Other (See Comments)    Hallucinations    Outpatient Encounter Medications as of 11/19/2018  Medication Sig  . albuterol (PROVENTIL) (2.5 MG/3ML) 0.083% nebulizer solution Take 3 mLs (2.5 mg total) by nebulization every 4 (four) hours as needed for wheezing or shortness of breath.  Marland Kitchen buPROPion (WELLBUTRIN SR) 100 MG  12 hr tablet Take 100 mg by mouth daily.  . carvedilol (COREG) 6.25 MG tablet Take 6.25 mg by mouth 2 (two) times daily with a meal.  . Dextromethorphan-guaiFENesin 10-100 MG/5ML SOLN Take 20 mLs by mouth every 4 (four) hours as needed.  . docusate sodium (COLACE) 100 MG capsule Take 100 mg by mouth daily.  . dorzolamide-timolol (COSOPT) 22.3-6.8 MG/ML ophthalmic solution Place 1 drop into the left eye 2 (two) times daily.   . Flaxseed, Linseed,  (FLAXSEED OIL) 1000 MG CAPS Take 1,000 mg by mouth daily.    . folic acid (FOLVITE) 710 MCG tablet Take 800 mcg by mouth every evening.   . food thickener (THICK IT) POWD Take 1 g by mouth daily.  . furosemide (LASIX) 20 MG tablet Take 20 mg by mouth daily. On Mon, Wed,Fri  . ipratropium-albuterol (DUONEB) 0.5-2.5 (3) MG/3ML SOLN Take 3 mLs by nebulization 2 (two) times daily.  Marland Kitchen linagliptin (TRADJENTA) 5 MG TABS tablet Take 5 mg by mouth daily.  Marland Kitchen LORazepam (ATIVAN PO) Take 0.125 mLs by mouth every 6 (six) hours as needed.  . memantine (NAMENDA) 10 MG tablet Take 10 mg by mouth 2 (two) times daily.  . metFORMIN (GLUCOPHAGE) 500 MG tablet Take 500 mg by mouth 2 (two) times daily with a meal.  . Multiple Vitamin (MULTIVITAMIN) tablet Take 1 tablet by mouth daily.    Marland Kitchen nystatin (MYCOSTATIN/NYSTOP) powder Apply topically 2 (two) times daily. Apply Nystatin Powder to left intergluteal fold and buttocks MASD until redness resolved  . omeprazole (PRILOSEC) 40 MG capsule Take 40 mg by mouth daily.  Marland Kitchen OVER THE COUNTER MEDICATION Apply 41 % topically as needed. Aquaphor Healing(white petrolatum) ointment apply to dry or red skin  . Probiotic Product (ALIGN) 4 MG CAPS Take 1 capsule by mouth.  . zinc oxide 20 % ointment Apply 1 application topically as needed for irritation. To buttocks after every incontinent episode and as needed for redness  . [DISCONTINUED] AMINO ACIDS-PROTEIN HYDROLYS PO Take 30 mLs by mouth daily.  . [DISCONTINUED] benzonatate (TESSALON) 100 MG capsule Take 1 capsule (100 mg total) by mouth 3 (three) times daily.  . [DISCONTINUED] Cholecalciferol (VITAMIN D) 1000 UNITS capsule Take 1,000 Units by mouth daily.    . [DISCONTINUED] LORazepam (ATIVAN) 0.5 MG tablet Take 0.5 tablets (0.25 mg total) by mouth every 6 (six) hours as needed for up to 14 days for anxiety.  . [DISCONTINUED] pantoprazole (PROTONIX) 40 MG tablet Take 1 tablet (40 mg total) by mouth daily.   No  facility-administered encounter medications on file as of 11/19/2018.    ROS was provided with assistance of staff Review of Systems  Constitutional: Negative for activity change, appetite change, chills, diaphoresis, fatigue, fever and unexpected weight change.  HENT: Positive for hearing loss and trouble swallowing. Negative for congestion and voice change.   Respiratory: Positive for cough and shortness of breath. Negative for wheezing.   Cardiovascular: Negative for chest pain, palpitations and leg swelling.  Gastrointestinal: Negative for abdominal distention, abdominal pain, constipation, diarrhea, nausea and vomiting.  Genitourinary: Negative for difficulty urinating, dysuria and urgency.  Musculoskeletal: Positive for gait problem.  Skin: Negative for color change and pallor.  Neurological: Positive for speech difficulty. Negative for dizziness, weakness and headaches.       Dementia, expressive aphasia  Psychiatric/Behavioral: Negative for agitation, behavioral problems, hallucinations and sleep disturbance. The patient is not nervous/anxious.     Immunization History  Administered Date(s) Administered  . Influenza-Unspecified 01/10/2017, 01/13/2018  .  PPD Test 06/24/2016  . Pneumococcal-Unspecified 06/26/2012  . Td 12/02/2011  . Zoster Recombinat (Shingrix) 06/10/2017, 08/12/2017   Pertinent  Health Maintenance Due  Topic Date Due  . OPHTHALMOLOGY EXAM  06/06/1940  . URINE MICROALBUMIN  06/06/1940  . FOOT EXAM  08/07/2018  . INFLUENZA VACCINE  11/01/2018  . HEMOGLOBIN A1C  12/24/2018  . PNA vac Low Risk Adult  Completed   Fall Risk  12/13/2017 10/14/2017 12/07/2016 01/19/2016 07/21/2015  Falls in the past year? No No No Yes Yes  Number falls in past yr: - - - 1 1  Injury with Fall? - - - No No  Risk for fall due to : - Impaired balance/gait - Impaired balance/gait -  Follow up - - - Falls prevention discussed Falls prevention discussed   Functional Status Survey:     Vitals:   11/19/18 0952  BP: 124/62  Pulse: 68  Resp: (!) 24  Temp: (!) 97.2 F (36.2 C)  SpO2: 96%  Weight: 166 lb 3.2 oz (75.4 kg)  Height: 6' (1.829 m)   Body mass index is 22.54 kg/m. Physical Exam Vitals signs and nursing note reviewed.  Constitutional:      General: He is not in acute distress.    Appearance: Normal appearance. He is normal weight. He is not ill-appearing, toxic-appearing or diaphoretic.  HENT:     Head: Normocephalic and atraumatic.     Nose: Nose normal.     Mouth/Throat:     Mouth: Mucous membranes are moist.  Eyes:     Extraocular Movements: Extraocular movements intact.     Conjunctiva/sclera: Conjunctivae normal.     Pupils: Pupils are equal, round, and reactive to light.  Neck:     Musculoskeletal: Normal range of motion and neck supple.  Cardiovascular:     Rate and Rhythm: Normal rate and regular rhythm.     Heart sounds: No murmur.  Pulmonary:     Breath sounds: Rales present. No wheezing or rhonchi.     Comments: Rales bibasilar Abdominal:     General: Bowel sounds are normal.     Palpations: Abdomen is soft.     Tenderness: There is no abdominal tenderness. There is no right CVA tenderness, left CVA tenderness, guarding or rebound.  Musculoskeletal:     Right lower leg: No edema.     Left lower leg: No edema.     Comments: W/c for mobility  Skin:    General: Skin is warm and dry.  Neurological:     General: No focal deficit present.     Mental Status: He is alert. Mental status is at baseline.     Cranial Nerves: No cranial nerve deficit.     Motor: No weakness.     Coordination: Coordination normal.     Gait: Gait abnormal.     Comments: Oriented to self.  Psychiatric:        Mood and Affect: Mood normal.        Behavior: Behavior normal.     Labs reviewed: Recent Labs    04/26/18 0316  05/13/18 0251  05/19/18 0306  08/31/18 0547 09/01/18 0821 09/03/18 0301  09/30/18 10/28/18 11/18/18  NA 148*   < > 142   < >  150*   < > 134* 141 144   < > 138 146 141  K 3.7   < > 4.1   < > 3.7   < > 4.6 3.9 3.5   < > 4.1 4.2 4.0  CL 115*   < > 115*   < > 117*   < > 99 106 110  --   --   --  102  CO2 26   < > 21*   < > 28   < > 22 23 24   --   --   --  29  GLUCOSE 168*   < > 104*   < > 152*   < > 354* 140* 196*  --   --   --   --   BUN 34*   < > 18   < > 20   < > 25* 17 20   < > 14 22* 20  CREATININE 1.32*   < > 1.00   < > 0.97   < > 1.06 0.77 0.76   < > 0.7 0.9 0.8  CALCIUM 8.2*   < > 7.8*   < > 8.0*   < > 8.8* 8.7* 9.0  --   --   --  8.9  MG 2.2  --  2.1  --  2.1  --   --   --   --   --   --   --   --   PHOS 3.3  --  3.1  --   --   --   --   --   --   --   --   --   --    < > = values in this interval not displayed.   Recent Labs    04/27/18 0321 05/01/18 0034 05/11/18 1626 05/13/18 0251 05/28/18  AST 44* 47* 37  --  12*  ALT 40 60* 27  --  13  ALKPHOS 91 111 86  --  96  BILITOT 0.5 0.6 0.5  --   --   PROT 5.5* 6.2* 5.4*  --   --   ALBUMIN 2.5* 2.6* 2.5* 2.0*  --    Recent Labs    05/13/18 0251 05/13/18 0505  08/30/18 2328 08/31/18 0547 09/01/18 0821 09/03/18 0301 09/16/18  WBC 7.8 8.2   < > 16.3* 11.6* 9.5 10.4 13.8  NEUTROABS 5.1 5.7  --  13.7*  --   --   --   --   HGB 5.5* 5.5*   < > 14.3 12.6* 12.6* 13.5 13.8  HCT 19.5* 19.1*   < > 44.6 38.9* 38.3* 41.7 42  MCV 92.9 92.7   < > 94.3 93.1 93.4 95.2  --   PLT 130* 126*   < > 170 127* 131* 147* 105*   < > = values in this interval not displayed.   Lab Results  Component Value Date   TSH 2.61 08/08/2017   Lab Results  Component Value Date   HGBA1C 6.2 06/23/2018   Lab Results  Component Value Date   CHOL 158 06/23/2018   HDL 39 06/23/2018   LDLCALC 99 06/23/2018   TRIG 100 06/23/2018    Significant Diagnostic Results in last 30 days:  No results found.  Assessment/Plan Chronic combined systolic and diastolic congestive heart failure (HCC) Compensated, continue Furosemide 20mg  qd  HTN (hypertension) Blood pressure is  controlled, continue Carvedilol 6.25mg  bid  Dysphagia Diet modification, risk for aspiration  Type 2 diabetes mellitus with diabetic chronic kidney disease ([place]) Blood sugar is controlled, continue Metfromin, Tradjenta. Last Hgb a1c 6.2 06/23/18  Alzheimer disease ([place]) Continue SNF [facility] for safety and care assistance, continue Memantine 10mg  bid for memory  Depression with anxiety His  mood is stable, continue Wellbutrin 100mg  qd, Lorazepam pan  Hypoxemia  stable, continue DuoNeb bid, Albuterol neb q4h prn, O2.    GERD (gastroesophageal reflux disease) Stable, continue Omeprazole 40mg  po qd     Family/ staff Communication: plan of care reviewed with the patient and charge nurse.   Labs/tests ordered:  none  Time spend 25 minutes

## 2018-11-19 NOTE — Assessment & Plan Note (Signed)
Continue SNF [facility] for safety and care assistance, continue Memantine 10mg  bid for memory

## 2018-12-09 ENCOUNTER — Encounter: Payer: Self-pay | Admitting: Nurse Practitioner

## 2018-12-09 DIAGNOSIS — L8992 Pressure ulcer of unspecified site, stage 2: Secondary | ICD-10-CM | POA: Insufficient documentation

## 2018-12-15 ENCOUNTER — Encounter: Payer: Self-pay | Admitting: Nurse Practitioner

## 2018-12-15 ENCOUNTER — Non-Acute Institutional Stay (SKILLED_NURSING_FACILITY): Payer: Medicare Other | Admitting: Nurse Practitioner

## 2018-12-15 DIAGNOSIS — L89812 Pressure ulcer of head, stage 2: Secondary | ICD-10-CM

## 2018-12-15 DIAGNOSIS — G301 Alzheimer's disease with late onset: Secondary | ICD-10-CM | POA: Diagnosis not present

## 2018-12-15 DIAGNOSIS — J9611 Chronic respiratory failure with hypoxia: Secondary | ICD-10-CM

## 2018-12-15 DIAGNOSIS — I5042 Chronic combined systolic (congestive) and diastolic (congestive) heart failure: Secondary | ICD-10-CM | POA: Diagnosis not present

## 2018-12-15 DIAGNOSIS — F0280 Dementia in other diseases classified elsewhere without behavioral disturbance: Secondary | ICD-10-CM

## 2018-12-15 NOTE — Progress Notes (Signed)
Location:  [place] Room Number: 30A Place of Service:  SNF (31) Provider: Pablo Mathurin Otho Darner, NP   [name], MD  Patient Care Team: [name], MD as PCP - General (Internal Medicine) [name], MD as PCP - Cardiology (Cardiology) Krrish Freund X, NP as Nurse Practitioner (Internal Medicine) [name], [name], NP as Nurse Practitioner (Family Medicine)  Extended Emergency Contact Information Primary Emergency Contact: [name] Address: [place], [place] Phone: [phone] Mobile Phone: [phone] Relation: Spouse Secondary Emergency Contact: [name] [place] of [place] Phone: [phone] Work Phone: [phone] Relation: Daughter  Code Status: DNR Goals of care: Advanced Directive information Advanced Directives 12/15/2018  Does Patient Have a Medical Advance Directive? Yes  Type of Paramedic of [place];Living will;Out of facility DNR (pink MOST or yellow form)  Does patient want to make changes to medical advance directive? No - Patient declined  Copy of [place] in Chart? Yes - validated most recent copy scanned in chart (See row information)  Would patient like information on creating a medical advance directive? -  Pre-existing out of facility DNR order (yellow form or pink MOST form) Yellow form placed in chart (order not valid for inpatient use)     Chief Complaint  Patient presents with  . Acute Visit    Skin Breakdown behind ears from O2 tubing     HPI:  Pt is a 85 y.o. male seen today for an acute visit for skin breakdown, pressure injury behind R+L ears from O2 tubing lays, no s/s of infection noted. Chronic respiratory failure, O2 dependent, prn Albuterol Neb q4hr, DuoNeb bid. HPI was provided with assistance of staff, on Memantine 10mg  bid for memory. CHF, O2 dependent, trace edema BLE, on Furosemide 20mg   qd.     Past Medical History:  Diagnosis Date  . Abnormality of gait   . Backache, unspecified   . CHF (congestive heart failure) ([place])   . Colon polyps    adenomatous  . Dementia ([place]) 11/12/2012  . Depression   . Diabetes mellitus without complication ([place])   . Diverticulosis   . Essential and other specified forms of tremor   . Hemorrhoids   . History of bilateral hip replacements 11/12/2012  . Memory loss   . 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 Pain in joint, pelvic region and thigh   . Skin cancer of scalp   . Spinal stenosis, lumbar region, without neurogenic claudication   . Unspecified hereditary and idiopathic peripheral neuropathy    Past Surgical History:  Procedure Laterality Date  . CATARACT EXTRACTION, BILATERAL  2012  . JOINT REPLACEMENT    . RETINAL LASER PROCEDURE  2012   retinal wrinkle  . SKIN CANCER EXCISION    . TOTAL HIP ARTHROPLASTY Left 2000  . TOTAL HIP ARTHROPLASTY (aka REPLACEMENT) Right 2011    Allergies  Allergen Reactions  . Axona [Bacid] Other (See Comments)    Unknown per MAR  . Gabapentin Other (See Comments)    Hallucinations    Outpatient Encounter Medications as of 12/15/2018  Medication Sig  . albuterol (PROVENTIL) (2.5 MG/3ML) 0.083% nebulizer solution Take 3 mLs (2.5 mg total) by nebulization every 4 (four) hours as needed for wheezing or shortness of breath.  Marland Kitchen buPROPion (WELLBUTRIN SR) 100 MG 12 hr tablet Take 50 mg (  half a tablet) by mouth twice daily.  . carvedilol (COREG) 6.25 MG tablet Take 6.25 mg by mouth 2 (two) times daily with a meal.  . Dextromethorphan-guaiFENesin 10-100 MG/5ML SOLN Take 20 mLs by mouth every 4 (four) hours as needed.  . dorzolamide-timolol (COSOPT) 22.3-6.8 MG/ML ophthalmic solution Place 1 drop into the left eye 2 (two) times daily.   . furosemide (LASIX) 20 MG tablet Take 20 mg by mouth daily. On Mon, Wed,Fri  . ipratropium-albuterol (DUONEB) 0.5-2.5 (3)  MG/3ML SOLN Take 3 mLs by nebulization 2 (two) times daily.  Marland Kitchen linagliptin (TRADJENTA) 5 MG TABS tablet Take 5 mg by mouth daily.  . memantine (NAMENDA) 10 MG tablet Take 10 mg by mouth 2 (two) times daily.  . metFORMIN (GLUCOPHAGE) 500 MG tablet Take 500 mg by mouth 2 (two) times daily with a meal.  . nystatin (MYCOSTATIN/NYSTOP) powder Apply topically 2 (two) times daily. Apply Nystatin Powder to left intergluteal fold and buttocks MASD until redness resolved  . omeprazole (PRILOSEC) 40 MG capsule Take 40 mg by mouth daily.  Marland Kitchen OVER THE COUNTER MEDICATION Apply 41 % topically as needed. Aquaphor Healing(white petrolatum) ointment apply to dry or red skin  . zinc oxide 20 % ointment Apply 1 application topically as needed for irritation. To buttocks after every incontinent episode and as needed for redness  . [DISCONTINUED] docusate sodium (COLACE) 100 MG capsule Take 100 mg by mouth daily.  . [DISCONTINUED] Flaxseed, Linseed, (FLAXSEED OIL) 1000 MG CAPS Take 1,000 mg by mouth daily.    . [DISCONTINUED] folic acid (FOLVITE) A999333 MCG tablet Take 800 mcg by mouth every evening.   . [DISCONTINUED] food thickener (THICK IT) POWD Take 1 g by mouth daily.  . [DISCONTINUED] LORazepam (ATIVAN PO) Take 0.125 mLs by mouth every 6 (six) hours as needed.  . [DISCONTINUED] Multiple Vitamin (MULTIVITAMIN) tablet Take 1 tablet by mouth daily.    . [DISCONTINUED] Probiotic Product (ALIGN) 4 MG CAPS Take 1 capsule by mouth.   No facility-administered encounter medications on file as of 12/15/2018.    ROS was provided with assistance of staff.  Review of Systems  Constitutional: Negative for activity change, appetite change, chills, diaphoresis, fatigue and fever.  HENT: Positive for hearing loss and trouble swallowing. Negative for congestion and voice change.   Eyes: Negative for visual disturbance.  Respiratory: Positive for cough and shortness of breath. Negative for wheezing.        DOE. O2 dependent.    Cardiovascular: Positive for leg swelling. Negative for chest pain and palpitations.  Gastrointestinal: Negative for abdominal distention, abdominal pain, constipation, diarrhea, nausea and vomiting.  Genitourinary: Negative for difficulty urinating, dysuria and urgency.  Musculoskeletal: Positive for gait problem.  Skin: Positive for wound.       Behind ears   Neurological: Positive for speech difficulty. Negative for dizziness, weakness and headaches.       Dementia, expressive aphasia.   Psychiatric/Behavioral: Negative for agitation, behavioral problems, hallucinations and sleep disturbance. The patient is not nervous/anxious.     Immunization History  Administered Date(s) Administered  . Influenza-Unspecified 01/10/2017, 01/13/2018  . PPD Test 06/24/2016  . Pneumococcal-Unspecified 06/26/2012  . Td 12/02/2011  . Zoster Recombinat (Shingrix) 06/10/2017, 08/12/2017   Pertinent  Health Maintenance Due  Topic Date Due  . OPHTHALMOLOGY EXAM  06/06/1940  . URINE MICROALBUMIN  06/06/1940  . FOOT EXAM  08/07/2018  . INFLUENZA VACCINE  11/01/2018  . HEMOGLOBIN A1C  12/24/2018  . PNA vac Low Risk Adult  Completed   Fall Risk  12/13/2017 10/14/2017 12/07/2016 01/19/2016 07/21/2015  Falls in the past year? No No No Yes Yes  Number falls in past yr: - - - 1 1  Injury with Fall? - - - No No  Risk for fall due to : - Impaired balance/gait - Impaired balance/gait -  Follow up - - - Falls prevention discussed Falls prevention discussed   Functional Status Survey:    Vitals:   12/15/18 1644  BP: 140/88  Pulse: 92  Resp: 20  Temp: 97.6 F (36.4 C)  TempSrc: Oral  SpO2: 93%  Weight: 166 lb 3.2 oz (75.4 kg)  Height: 6' (1.829 m)   Body mass index is 22.54 kg/m. Physical Exam Vitals signs and nursing note reviewed.  Constitutional:      General: He is not in acute distress.    Appearance: Normal appearance. He is not ill-appearing, toxic-appearing or diaphoretic.  HENT:     Head:  Normocephalic and atraumatic.     Nose: Nose normal.     Mouth/Throat:     Mouth: Mucous membranes are moist.  Eyes:     Extraocular Movements: Extraocular movements intact.     Conjunctiva/sclera: Conjunctivae normal.     Pupils: Pupils are equal, round, and reactive to light.  Neck:     Musculoskeletal: Normal range of motion and neck supple.  Cardiovascular:     Rate and Rhythm: Normal rate and regular rhythm.     Heart sounds: No murmur.  Pulmonary:     Breath sounds: Rales present. No wheezing or rhonchi.  Abdominal:     General: Bowel sounds are normal. There is no distension.     Palpations: Abdomen is soft.     Tenderness: There is no abdominal tenderness. There is no right CVA tenderness, left CVA tenderness, guarding or rebound.  Musculoskeletal:     Right lower leg: Edema present.     Left lower leg: Edema present.     Comments: Trace edema in ankles.   Skin:    General: Skin is warm and dry.     Comments: Pressure ulcers behind ears where O2 tube lays, no s/s of infection  Neurological:     General: No focal deficit present.     Mental Status: He is alert. Mental status is at baseline.     Cranial Nerves: No cranial nerve deficit.     Motor: No weakness.     Coordination: Coordination normal.     Gait: Gait abnormal.     Comments: Oriented to self.   Psychiatric:        Mood and Affect: Mood normal.        Behavior: Behavior normal.     Labs reviewed: Recent Labs    04/26/18 0316  05/13/18 0251  05/19/18 0306  08/31/18 0547 09/01/18 0821 09/03/18 0301  09/30/18 10/28/18 11/18/18  NA 148*   < > 142   < > 150*   < > 134* 141 144   < > 138 146 141  K 3.7   < > 4.1   < > 3.7   < > 4.6 3.9 3.5   < > 4.1 4.2 4.0  CL 115*   < > 115*   < > 117*   < > 99 106 110  --   --   --  102  CO2 26   < > 21*   < > 28   < > 22 23 24   --   --   --  29  GLUCOSE 168*   < > 104*   < > 152*   < > 354* 140* 196*  --   --   --   --   BUN 34*   < > 18   < > 20   < > 25* 17 20    < > 14 22* 20  CREATININE 1.32*   < > 1.00   < > 0.97   < > 1.06 0.77 0.76   < > 0.7 0.9 0.8  CALCIUM 8.2*   < > 7.8*   < > 8.0*   < > 8.8* 8.7* 9.0  --   --   --  8.9  MG 2.2  --  2.1  --  2.1  --   --   --   --   --   --   --   --   PHOS 3.3  --  3.1  --   --   --   --   --   --   --   --   --   --    < > = values in this interval not displayed.   Recent Labs    04/27/18 0321 05/01/18 0034 05/11/18 1626 05/13/18 0251 05/28/18  AST 44* 47* 37  --  12*  ALT 40 60* 27  --  13  ALKPHOS 91 111 86  --  96  BILITOT 0.5 0.6 0.5  --   --   PROT 5.5* 6.2* 5.4*  --   --   ALBUMIN 2.5* 2.6* 2.5* 2.0*  --    Recent Labs    05/13/18 0251 05/13/18 0505  08/30/18 2328 08/31/18 0547 09/01/18 0821 09/03/18 0301 09/16/18  WBC 7.8 8.2   < > 16.3* 11.6* 9.5 10.4 13.8  NEUTROABS 5.1 5.7  --  13.7*  --   --   --   --   HGB 5.5* 5.5*   < > 14.3 12.6* 12.6* 13.5 13.8  HCT 19.5* 19.1*   < > 44.6 38.9* 38.3* 41.7 42  MCV 92.9 92.7   < > 94.3 93.1 93.4 95.2  --   PLT 130* 126*   < > 170 127* 131* 147* 105*   < > = values in this interval not displayed.   Lab Results  Component Value Date   TSH 2.61 08/08/2017   Lab Results  Component Value Date   HGBA1C 6.2 06/23/2018   Lab Results  Component Value Date   CHOL 158 06/23/2018   HDL 39 06/23/2018   LDLCALC 99 06/23/2018   TRIG 100 06/23/2018    Significant Diagnostic Results in last 30 days:  No results found.  Assessment/Plan Pressure ulcer, stage 2 (HCC) Skin breakdown, pressure injury behind R+L ears from O2 tubing lays, no s/s of infection noted. Avoid pressure.   Chronic respiratory failure (HCC) Chronic respiratory failure, continue O2 dependent, prn Albuterol Neb q4hr, DuoNeb bid.  Alzheimer disease ([place]) Continue SNF [facility] for safety, care assistance, continue Memantine 10mg  bid for memory.   Chronic combined systolic and diastolic congestive heart failure (HCC) O2 dependent, trace edema BLE, continue Furosemide 20mg  qd.      Family/ staff Communication: plan of care reviewed with the patient and charge nurse.   Labs/tests ordered: none  Time spend 25 minutes.

## 2018-12-15 NOTE — Assessment & Plan Note (Signed)
Skin breakdown, pressure injury behind R+L ears from O2 tubing lays, no s/s of infection noted. Avoid pressure.

## 2018-12-16 ENCOUNTER — Non-Acute Institutional Stay (SKILLED_NURSING_FACILITY): Payer: Medicare Other | Admitting: Internal Medicine

## 2018-12-16 ENCOUNTER — Encounter: Payer: Self-pay | Admitting: Internal Medicine

## 2018-12-16 ENCOUNTER — Other Ambulatory Visit: Payer: Self-pay | Admitting: Internal Medicine

## 2018-12-16 DIAGNOSIS — T17908S Unspecified foreign body in respiratory tract, part unspecified causing other injury, sequela: Secondary | ICD-10-CM | POA: Diagnosis not present

## 2018-12-16 DIAGNOSIS — E1122 Type 2 diabetes mellitus with diabetic chronic kidney disease: Secondary | ICD-10-CM | POA: Diagnosis not present

## 2018-12-16 DIAGNOSIS — I5042 Chronic combined systolic (congestive) and diastolic (congestive) heart failure: Secondary | ICD-10-CM | POA: Diagnosis not present

## 2018-12-16 DIAGNOSIS — J189 Pneumonia, unspecified organism: Secondary | ICD-10-CM | POA: Diagnosis not present

## 2018-12-16 DIAGNOSIS — R1310 Dysphagia, unspecified: Secondary | ICD-10-CM

## 2018-12-16 MED ORDER — LORAZEPAM 2 MG/ML PO CONC: 0.5000 mg | Freq: Four times a day (QID) | ORAL | 0 refills | Status: DC | PRN

## 2018-12-16 NOTE — Addendum Note (Signed)
Addended by: Georgina Snell on: 12/16/2018 09:04 PM   Modules accepted: Level of Service

## 2018-12-16 NOTE — Progress Notes (Addendum)
Location:  [place] Room Number: 30 Place of Service:  SNF [phone]) Provider:    [name], MD  Patient Care Team: [name], MD as PCP - General (Internal Medicine) [name], MD as PCP - Cardiology (Cardiology) Mast, Man X, NP as Nurse Practitioner (Internal Medicine) [name], [name], NP as Nurse Practitioner (Family Medicine)  Extended Emergency Contact Information Primary Emergency Contact: [name] Address: [place], [place] Phone: [phone] Mobile Phone: [phone] Relation: Spouse Secondary Emergency Contact: [name] [place] Phone: [phone] Work Phone: [phone] Relation: Daughter  Code Status:  Goals of care: Advanced Directive information Advanced Directives 12/15/2018  Does Patient Have a Medical Advance Directive? Yes  Type of Paramedic of [place];Living will;Out of facility DNR (pink MOST or yellow form)  Does patient want to make changes to medical advance directive? No - Patient declined  Copy of [place] in Chart? Yes - validated most recent copy scanned in chart (See row information)  Would patient like information on creating a medical advance directive? -  Pre-existing out of facility DNR order (yellow form or pink MOST form) Yellow form placed in chart (order not valid for inpatient use)     Chief Complaint  Patient presents with   Acute Visit    C/o - SOB episodes    HPI:  Pt is a 85 y.o. male seen today for an acute visit for episodes in which patient gets very anxious and short of breath.  Patienthas h/o Recurrent Aspiration Pneumonia due to Dysphagiaand Dementiawith Hypernatremia He also has h/o Type 2 diabetes,Advanceddementia, hypertension, systolic CHFEF of 123456, depression, anemia, due to GI Bleed  Patient unable to give any history due to his  dementia. Per nurses patient had an episode yesterday in which her oxygen dropped to 60.  He was very anxious had increasing respiratory rate. He did respond to neb treatment and oxygen.  Has not had any fever.  He continues to have cough and increased respiratory rate Nurses also wanted sliding scale to be restarted as patient sometimes spikes blood sugar of more than 200 usually during these episodes. He was started on some Ativan after getting approval from his wife.  It has helped with his anxiety but nurses were wondering if dose can be increased. His wife does not want hospice involved yet and wants him to be then transferred to the hospital if his hypoxia is not better with nebs Past Medical History:  Diagnosis Date   Abnormality of gait    Backache, unspecified    CHF (congestive heart failure) ([place])    Colon polyps    adenomatous   Dementia ([place]) 11/12/2012   Depression    Diabetes mellitus without complication ([place])    Diverticulosis    Essential and other specified forms of tremor    Hemorrhoids    History of bilateral hip replacements 11/12/2012   Memory loss    Other persistent mental disorders due to conditions classified elsewhere    Pain in joint, pelvic region and thigh    Skin cancer of scalp    Spinal stenosis, lumbar region, without neurogenic claudication    Unspecified hereditary and idiopathic peripheral neuropathy    Past Surgical History:  Procedure Laterality Date   CATARACT EXTRACTION, BILATERAL  2012   JOINT REPLACEMENT     RETINAL LASER PROCEDURE  2012   retinal wrinkle   SKIN CANCER EXCISION     TOTAL HIP ARTHROPLASTY Left 2000   TOTAL HIP ARTHROPLASTY (aka REPLACEMENT) Right 2011    Allergies  Allergen Reactions   Axona [Bacid] Other (See Comments)    Unknown per MAR   Gabapentin Other (See Comments)    Hallucinations    Outpatient Encounter Medications as of 12/16/2018  Medication Sig   albuterol (PROVENTIL) (2.5  MG/3ML) 0.083% nebulizer solution Take 3 mLs (2.5 mg total) by nebulization every 4 (four) hours as needed for wheezing or shortness of breath.   buPROPion (WELLBUTRIN SR) 100 MG 12 hr tablet Take 50 mg (half a tablet) by mouth twice daily.   carvedilol (COREG) 6.25 MG tablet Take 6.25 mg by mouth 2 (two) times daily with a meal.   Dextromethorphan-guaiFENesin 10-100 MG/5ML SOLN Take 20 mLs by mouth every 4 (four) hours as needed.   docusate (COLACE) 50 MG/5ML liquid Take 100 mg by mouth daily.   LORazepam (ATIVAN PO) Take 0.125 mLs by mouth every 6 (six) hours as needed.   OVER THE COUNTER MEDICATION Apply 41 % topically as needed. Aquaphor Healing(white petrolatum) ointment apply to dry or red skin   docusate sodium (COLACE) 100 MG capsule Take 100 mg by mouth daily.   dorzolamide-timolol (COSOPT) 22.3-6.8 MG/ML ophthalmic solution Place 1 drop into the left eye 2 (two) times daily.    Flaxseed, Linseed, (FLAXSEED OIL) 1000 MG CAPS Take 1,000 mg by mouth daily.     folic acid (FOLVITE) A999333 MCG tablet Take 800 mcg by mouth every evening.    food thickener (THICK IT) POWD Take 1 g by mouth daily.   furosemide (LASIX) 20 MG tablet Take 20 mg by mouth daily. On Mon, Wed,Fri   ipratropium-albuterol (DUONEB) 0.5-2.5 (3) MG/3ML SOLN Take 3 mLs by nebulization 2 (two) times daily.   linagliptin (TRADJENTA) 5 MG TABS tablet Take 5 mg by mouth daily.   memantine (NAMENDA) 10 MG tablet Take 10 mg by mouth 2 (two) times daily.   metFORMIN (GLUCOPHAGE) 500 MG tablet Take 500 mg by mouth 2 (two) times daily with a meal.   Multiple Vitamin (MULTIVITAMIN) tablet Take 1 tablet by mouth daily.     nystatin (MYCOSTATIN/NYSTOP) powder Apply topically 2 (two) times daily. Apply Nystatin Powder to left intergluteal fold and buttocks MASD until redness resolved   omeprazole (PRILOSEC) 40 MG capsule Take 40 mg by mouth daily.   Probiotic Product (ALIGN) 4 MG CAPS Take 1 capsule by mouth.   zinc  oxide 20 % ointment Apply 1 application topically as needed for irritation. To buttocks after every incontinent episode and as needed for redness   No facility-administered encounter medications on file as of 12/16/2018.     Review of Systems  Unable to perform ROS: Dementia    Immunization History  Administered Date(s) Administered   Influenza-Unspecified 01/10/2017, 01/13/2018   PPD Test 06/24/2016   Pneumococcal-Unspecified 06/26/2012   Td 12/02/2011   Zoster Recombinat (Shingrix) 06/10/2017, 08/12/2017   Pertinent  Health Maintenance Due  Topic Date Due   OPHTHALMOLOGY EXAM  06/06/1940   URINE MICROALBUMIN  06/06/1940   FOOT EXAM  08/07/2018   INFLUENZA VACCINE  11/01/2018   HEMOGLOBIN A1C  12/24/2018   PNA vac Low Risk Adult  Completed   Fall Risk  12/13/2017 10/14/2017 12/07/2016 01/19/2016 07/21/2015  Falls in the past year? No No No Yes Yes  Number falls in past yr: - - - 1 1  Injury with Fall? - - - No No  Risk for fall due to : - Impaired balance/gait - Impaired balance/gait -  Follow up - - - Falls prevention discussed Falls prevention discussed   Functional Status Survey:    Vitals:   12/16/18 1142  BP: 128/68  Pulse: 66  Resp: 20  Temp: (!) 97.4 F (36.3 C)  SpO2: 92%  Weight: 166 lb 3.2 oz (75.4 kg)  Height: 6' (1.829 m)   Body mass index is 22.54 kg/m. Physical Exam Vitals signs reviewed.  Constitutional:      Appearance: Normal appearance.  HENT:     Head: Normocephalic.     Nose: Congestion present.     Mouth/Throat:     Mouth: Mucous membranes are moist.     Pharynx: Oropharynx is clear.  Eyes:     Pupils: Pupils are equal, round, and reactive to light.  Neck:     Musculoskeletal: Neck supple.  Cardiovascular:     Rate and Rhythm: Normal rate.     Pulses: Normal pulses.     Heart sounds: Normal heart sounds.  Pulmonary:     Breath sounds: Rales present. No wheezing.     Comments: increased respiratory Rate with Cough    Abdominal:     General: Abdomen is flat. Bowel sounds are normal.     Palpations: Abdomen is soft.  Musculoskeletal:        General: No swelling.  Skin:    General: Skin is warm and dry.  Neurological:     Mental Status: He is alert.     Comments: Follows some Commands but mostly does not responds  Psychiatric:        Mood and Affect: Mood normal.        Thought Content: Thought content normal.     Labs reviewed: Recent Labs    04/26/18 0316  05/13/18 0251  05/19/18 0306  08/31/18 0547 09/01/18 0821 09/03/18 0301  09/30/18 10/28/18 11/18/18  NA 148*   < > 142   < > 150*   < > 134* 141 144   < > 138 146 141  K 3.7   < > 4.1   < > 3.7   < > 4.6 3.9 3.5   < > 4.1 4.2 4.0  CL 115*   < > 115*   < > 117*   < > 99 106 110  --   --   --  102  CO2 26   < > 21*   < > 28   < > 22 23 24   --   --   --  29  GLUCOSE 168*   < > 104*   < > 152*   < > 354* 140* 196*  --   --   --   --   BUN 34*   < > 18   < > 20   < > 25* 17 20   < > 14 22* 20  CREATININE 1.32*   < > 1.00   < > 0.97   < > 1.06 0.77 0.76   < > 0.7 0.9 0.8  CALCIUM 8.2*   < > 7.8*   < > 8.0*   < > 8.8* 8.7* 9.0  --   --   --  8.9  MG 2.2  --  2.1  --  2.1  --   --   --   --   --   --   --   --  PHOS 3.3  --  3.1  --   --   --   --   --   --   --   --   --   --    < > = values in this interval not displayed.   Recent Labs    04/27/18 0321 05/01/18 0034 05/11/18 1626 05/13/18 0251 05/28/18  AST 44* 47* 37  --  12*  ALT 40 60* 27  --  13  ALKPHOS 91 111 86  --  96  BILITOT 0.5 0.6 0.5  --   --   PROT 5.5* 6.2* 5.4*  --   --   ALBUMIN 2.5* 2.6* 2.5* 2.0*  --    Recent Labs    05/13/18 0251 05/13/18 0505  08/30/18 2328 08/31/18 0547 09/01/18 0821 09/03/18 0301 09/16/18  WBC 7.8 8.2   < > 16.3* 11.6* 9.5 10.4 13.8  NEUTROABS 5.1 5.7  --  13.7*  --   --   --   --   HGB 5.5* 5.5*   < > 14.3 12.6* 12.6* 13.5 13.8  HCT 19.5* 19.1*   < > 44.6 38.9* 38.3* 41.7 42  MCV 92.9 92.7   < > 94.3 93.1 93.4 95.2  --   PLT  130* 126*   < > 170 127* 131* 147* 105*   < > = values in this interval not displayed.   Lab Results  Component Value Date   TSH 2.61 08/08/2017   Lab Results  Component Value Date   HGBA1C 6.2 06/23/2018   Lab Results  Component Value Date   CHOL 158 06/23/2018   HDL 39 06/23/2018   LDLCALC 99 06/23/2018   TRIG 100 06/23/2018    Significant Diagnostic Results in last 30 days:  No results found.  Assessment/Plan Dysphagia with episodes of aspiration Will continue nebs for his hypoxia Increased Ativan 0.5 mg every 6 hours as needed for distress We will check a CBC and CMP due to his rales Wife does not want hospice referral yet Also do a chest x-ray to rule out pneumonia Type 2 diabetes mellitus with diabetic chronic kidney disease, He sometimes spikes sugars more than 300 during these episodes  We will write for loose sliding scale for these times Repeat HBA1C  Addendum Patients Chest Xray showed Bilateral Infiltrate most likely Pneumonia Will start him on Augmentin 875 mg BID for 7 days To D/W the wife again that this is going to keep happening  Other issues Chronic combined systolic and diastolicCHF- Plan: Weight is stable on Lasix low dose  Hypernatremia - Plan: Last sodium was 141 Continue Water supplement Have d/w wife that we want to avoid IV fluids andreason for his hypernatremia is combination of dysphagia and dementia  Essential hypertension - Plan: On Low dose of Coreg Depression On Wellbutrin Stage 2 Pressure Ulcer Per wound care on Hydrogel and Nystatin Air Matress. Dementia  Continue Supportive care and Namenda  Family/ staff Communication:   Labs/tests ordered:  CBC,BMP,HBA1C and Chest Xray  Total time spent in this patient care encounter was  45_  minutes; greater than 50% of the visit spent counseling  staff, reviewing records , Labs and coordinating care for problems addressed at this encounter.

## 2018-12-18 DIAGNOSIS — E1122 Type 2 diabetes mellitus with diabetic chronic kidney disease: Secondary | ICD-10-CM | POA: Diagnosis not present

## 2018-12-18 LAB — CBC AND DIFFERENTIAL
HCT: 37 — AB (ref 41–53)
Hemoglobin: 11.7 — AB (ref 13.5–17.5)
Platelets: 168 (ref 150–399)
WBC: 7.7

## 2018-12-18 LAB — BASIC METABOLIC PANEL
BUN: 27 — AB (ref 4–21)
Creatinine: 0.8 (ref 0.6–1.3)
Glucose: 144
Potassium: 4.9 (ref 3.4–5.3)
Sodium: 146 (ref 137–147)

## 2018-12-18 LAB — HEMOGLOBIN A1C: Hemoglobin A1C: 6.7

## 2018-12-19 ENCOUNTER — Encounter: Payer: Self-pay | Admitting: Nurse Practitioner

## 2018-12-19 NOTE — Assessment & Plan Note (Signed)
Chronic respiratory failure, continue O2 dependent, prn Albuterol Neb q4hr, DuoNeb bid.

## 2018-12-19 NOTE — Assessment & Plan Note (Signed)
Continue SN[facility] for safety, care assistance, continue Memantine 10mg  bid for memory.

## 2018-12-19 NOTE — Assessment & Plan Note (Signed)
O2 dependent, trace edema BLE, continue Furosemide 20mg  qd.

## 2018-12-23 DIAGNOSIS — B351 Tinea unguium: Secondary | ICD-10-CM | POA: Diagnosis not present

## 2018-12-23 DIAGNOSIS — Q6689 Other  specified congenital deformities of feet: Secondary | ICD-10-CM | POA: Diagnosis not present

## 2018-12-23 DIAGNOSIS — E1159 Type 2 diabetes mellitus with other circulatory complications: Secondary | ICD-10-CM | POA: Diagnosis not present

## 2018-12-23 DIAGNOSIS — L84 Corns and callosities: Secondary | ICD-10-CM | POA: Diagnosis not present

## 2018-12-29 ENCOUNTER — Other Ambulatory Visit: Payer: Self-pay | Admitting: Internal Medicine

## 2018-12-29 ENCOUNTER — Encounter: Payer: Self-pay | Admitting: Internal Medicine

## 2018-12-29 ENCOUNTER — Non-Acute Institutional Stay (SKILLED_NURSING_FACILITY): Payer: Medicare Other | Admitting: Internal Medicine

## 2018-12-29 DIAGNOSIS — T17908S Unspecified foreign body in respiratory tract, part unspecified causing other injury, sequela: Secondary | ICD-10-CM

## 2018-12-29 DIAGNOSIS — I5042 Chronic combined systolic (congestive) and diastolic (congestive) heart failure: Secondary | ICD-10-CM

## 2018-12-29 DIAGNOSIS — R1310 Dysphagia, unspecified: Secondary | ICD-10-CM | POA: Diagnosis not present

## 2018-12-29 DIAGNOSIS — L89812 Pressure ulcer of head, stage 2: Secondary | ICD-10-CM | POA: Diagnosis not present

## 2018-12-29 LAB — CHLORIDE
Calcium: 8.8
Carbon Dioxide, Total: 28
Chloride: 108

## 2018-12-29 MED ORDER — MORPHINE SULFATE (CONCENTRATE) 20 MG/ML PO SOLN: 2.5000 mg | ORAL | 0 refills | Status: AC | PRN

## 2018-12-29 NOTE — Progress Notes (Signed)
[facility] Room Number: 30 Place of Service:  SNF (31) Provider:  Veleta Miners MD [name], MD  Patient Care Team: [name], MD as PCP - General (Internal Medicine) [name], MD as PCP - Cardiology (Cardiology) Mast, Man X, NP as Nurse Practitioner (Internal Medicine) [name], [name], NP as Nurse Practitioner (Family Medicine)  Extended Emergency Contact Information Primary Emergency Contact: [name] Address: [place], [place] Phone: [phone] Mobile Phone: [phone] Relation: Spouse Secondary Emergency Contact: [name] [place] Phone: [phone] Work Phone: [phone] Relation: Daughter  Code Status:  DNR Goals of care: Advanced Directive information Advanced Directives 12/29/2018  Does Patient Have a Medical Advance Directive? Yes  Type of Paramedic of [place];Living will;Out of facility DNR (pink MOST or yellow form)  Does patient want to make changes to medical advance directive? No - Patient declined  Copy of [place] in Chart? Yes - validated most recent copy scanned in chart (See row information)  Would patient like information on creating a medical advance directive? -  Pre-existing out of facility DNR order (yellow form or pink MOST form) Yellow form placed in chart (order not valid for inpatient use)     Chief Complaint  Patient presents with  . Acute Visit    Left heel wound & Dyspnea    HPI:  Pt is a 85 y.o. male seen today for an acute visit for episodes of shortness of breath and hypoxia.  Patient has also developed an unstageable wound on his left heel  Patienthas h/o Recurrent Aspiration Pneumonia due to Dysphagiaand Dementiawith Hypernatremia He also has h/o Type 2 diabetes,Advanceddementia, hypertension, systolic CHFEF 123456, depression, anemia, due to GI Bleed  Patient unable to give any history due to his dementia  Per nurses patient continues to have episodes and that he drops his oxygen to 70.  He gets very anxious.  He also continues to have increased respiratory rate.  He recently had a chest x-ray done which showed bilateral infiltrate concerning for aspiration pneumonia.  He was treated with Augmentin for 7 days But patient continues to be getting dyspneic. Today he was tachycardic and had increased respiratory rate.  He has lost weight but does not have any edema in his lower extremity.  No fever or chills His wife has resisted making him hospice.  But he is on low-dose of Ativan but nurses they are using it more frequently due to his anxiety   Past Medical History:  Diagnosis Date  . Abnormality of gait   . Backache, unspecified   . CHF (congestive heart failure) ([place])   . Colon polyps    adenomatous  . Dementia ([place]) 11/12/2012  . Depression   . Diabetes mellitus without complication ([place])   . Diverticulosis   . Essential and other specified forms of tremor   . Hemorrhoids   . History of bilateral hip replacements 11/12/2012  . Memory loss   . 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 Pain in joint, pelvic region and thigh   . Skin cancer of scalp   . Spinal stenosis, lumbar region, without neurogenic claudication   . Unspecified hereditary and idiopathic peripheral neuropathy    Past Surgical History:  Procedure Laterality Date  . CATARACT EXTRACTION, BILATERAL  2012  . JOINT REPLACEMENT    .  RETINAL LASER PROCEDURE  2012   retinal wrinkle  . SKIN CANCER EXCISION    . TOTAL HIP ARTHROPLASTY Left 2000  . TOTAL HIP ARTHROPLASTY (aka REPLACEMENT) Right 2011    Allergies  Allergen Reactions  . Axona [Bacid] Other (See Comments)    Unknown per MAR  . Gabapentin Other (See Comments)    Hallucinations    Allergies as of 12/29/2018      Reactions   Axona [bacid] Other (See Comments)    Unknown per MAR   Gabapentin Other (See Comments)   Hallucinations      Medication List       Accurate as of December 29, 2018  2:47 PM. If you have any questions, ask your nurse or doctor.        STOP taking these medications   linagliptin 5 MG Tabs tablet Commonly known as: TRADJENTA Stopped by: [name], MD     TAKE these medications   albuterol (2.5 MG/3ML) 0.083% nebulizer solution Commonly known as: PROVENTIL Take 3 mLs (2.5 mg total) by nebulization every 4 (four) hours as needed for wheezing or shortness of breath.   ATIVAN PO Take 0.25 mLs by mouth every 6 (six) hours as needed. 0.5 mg = 0.81ml   buPROPion 100 MG 12 hr tablet Commonly known as: WELLBUTRIN SR Take 50 mg (half a tablet) by mouth twice daily.   carvedilol 6.25 MG tablet Commonly known as: COREG Take 6.25 mg by mouth 2 (two) times daily with a meal.   Dextromethorphan-guaiFENesin 10-100 MG/5ML Soln Take 20 mLs by mouth every 4 (four) hours as needed.   docusate 50 MG/5ML liquid Commonly known as: COLACE Take 100 mg by mouth daily.   dorzolamide-timolol 22.3-6.8 MG/ML ophthalmic solution Commonly known as: COSOPT Place 1 drop into the left eye 2 (two) times daily.   furosemide 20 MG tablet Commonly known as: LASIX Take 20 mg by mouth daily. On Mon, Wed,Fri   ipratropium-albuterol 0.5-2.5 (3) MG/3ML Soln Commonly known as: DUONEB Take 3 mLs by nebulization 2 (two) times daily.   memantine 10 MG tablet Commonly known as: NAMENDA Take 10 mg by mouth 2 (two) times daily.   metFORMIN 500 MG tablet Commonly known as: GLUCOPHAGE Take 500 mg by mouth 2 (two) times daily with a meal.   NovoLOG 100 UNIT/ML injection Generic drug: insulin aspart Inject into the skin every morning. 100 unit/mL; amt: Per Sliding Scale;  If Blood Sugar is 250 to 300, give 5 Units. If Blood Sugar is greater than 300, give 8 Units. subcutaneous   nystatin powder Commonly known as: MYCOSTATIN/NYSTOP  Apply topically 2 (two) times daily. Apply Nystatin Powder to left intergluteal fold and buttocks MASD until redness resolved   omeprazole 40 MG capsule Commonly known as: PRILOSEC Take 40 mg by mouth daily.   OVER THE COUNTER MEDICATION Apply 41 % topically as needed. Aquaphor Healing(white petrolatum) ointment apply to dry or red skin   sitaGLIPtin 50 MG tablet Commonly known as: JANUVIA Take 50 mg by mouth daily.   zinc oxide 20 % ointment Apply 1 application topically as needed for irritation. To buttocks after every incontinent episode and as needed for redness       Review of Systems  Unable to perform ROS: Dementia    Immunization History  Administered Date(s) Administered  . Influenza-Unspecified 01/10/2017, 01/13/2018  . PPD Test 06/24/2016  . Pneumococcal-Unspecified 06/26/2012  . Td 12/02/2011  . Zoster Recombinat (Shingrix) 06/10/2017, 08/12/2017   Pertinent  Health Maintenance Due  Topic Date Due  . OPHTHALMOLOGY EXAM  06/06/1940  . URINE MICROALBUMIN  06/06/1940  . FOOT EXAM  08/07/2018  . INFLUENZA VACCINE  11/01/2018  . HEMOGLOBIN A1C  12/24/2018  . PNA vac Low Risk Adult  Completed   Fall Risk  12/13/2017 10/14/2017 12/07/2016 01/19/2016 07/21/2015  Falls in the past year? No No No Yes Yes  Number falls in past yr: - - - 1 1  Injury with Fall? - - - No No  Risk for fall due to : - Impaired balance/gait - Impaired balance/gait -  Follow up - - - Falls prevention discussed Falls prevention discussed   Functional Status Survey:    Vitals:   12/29/18 1419  BP: 112/60  Pulse: (!) 108  Resp: (!) 28  Temp: (!) 97.5 F (36.4 C)  SpO2: 93%  Weight: 162 lb 6.4 oz (73.7 kg)   Body mass index is 22.03 kg/m. Physical Exam Vitals signs reviewed.  Constitutional:      Appearance: He is ill-appearing.  HENT:     Head: Normocephalic.     Nose: Nose normal.     Mouth/Throat:     Mouth: Mucous membranes are moist.     Pharynx: Oropharynx is clear.   Eyes:     Pupils: Pupils are equal, round, and reactive to light.  Neck:     Musculoskeletal: Neck supple.  Cardiovascular:     Rate and Rhythm: Tachycardia present.     Pulses: Normal pulses.  Pulmonary:     Effort: Pulmonary effort is normal.     Breath sounds: Rales present.  Abdominal:     General: Abdomen is flat. Bowel sounds are normal.     Palpations: Abdomen is soft.  Musculoskeletal:        General: No swelling.     Comments: Has Unstageable Left Heel Wound  Skin:    General: Skin is warm and dry.  Neurological:     Mental Status: He is alert.  Psychiatric:        Mood and Affect: Mood normal.        Thought Content: Thought content normal.        Judgment: Judgment normal.     Labs reviewed: Recent Labs    04/26/18 0316  05/13/18 0251  05/19/18 0306  08/31/18 0547 09/01/18 0821 09/03/18 0301  10/28/18 11/18/18 12/18/18  NA 148*   < > 142   < > 150*   < > 134* 141 144   < > 146 141 146  K 3.7   < > 4.1   < > 3.7   < > 4.6 3.9 3.5   < > 4.2 4.0 4.9  CL 115*   < > 115*   < > 117*   < > 99 106 110  --   --  102 108  CO2 26   < > 21*   < > 28   < > 22 23 24   --   --  29 28  GLUCOSE 168*   < > 104*   < > 152*   < > 354* 140* 196*  --   --   --   --   BUN 34*   < > 18   < > 20   < > 25* 17 20   < > 22* 20 27*  CREATININE 1.32*   < > 1.00   < > 0.97   < > 1.06 0.77 0.76   < >  0.9 0.8 0.8  CALCIUM 8.2*   < > 7.8*   < > 8.0*   < > 8.8* 8.7* 9.0  --   --  8.9 8.8  MG 2.2  --  2.1  --  2.1  --   --   --   --   --   --   --   --   PHOS 3.3  --  3.1  --   --   --   --   --   --   --   --   --   --    < > = values in this interval not displayed.   Recent Labs    04/27/18 0321 05/01/18 0034 05/11/18 1626 05/13/18 0251 05/28/18  AST 44* 47* 37  --  12*  ALT 40 60* 27  --  13  ALKPHOS 91 111 86  --  96  BILITOT 0.5 0.6 0.5  --   --   PROT 5.5* 6.2* 5.4*  --   --   ALBUMIN 2.5* 2.6* 2.5* 2.0*  --    Recent Labs    05/13/18 0251 05/13/18 0505  08/30/18 2328  08/31/18 0547 09/01/18 0821 09/03/18 0301 09/16/18 12/18/18  WBC 7.8 8.2   < > 16.3* 11.6* 9.5 10.4 13.8 7.7  NEUTROABS 5.1 5.7  --  13.7*  --   --   --   --   --   HGB 5.5* 5.5*   < > 14.3 12.6* 12.6* 13.5 13.8 11.7*  HCT 19.5* 19.1*   < > 44.6 38.9* 38.3* 41.7 42 37*  MCV 92.9 92.7   < > 94.3 93.1 93.4 95.2  --   --   PLT 130* 126*   < > 170 127* 131* 147* 105* 168   < > = values in this interval not displayed.   Lab Results  Component Value Date   TSH 2.61 08/08/2017   Lab Results  Component Value Date   HGBA1C 6.7 12/18/2018   Lab Results  Component Value Date   CHOL 158 06/23/2018   HDL 39 06/23/2018   LDLCALC 99 06/23/2018   TRIG 100 06/23/2018    Significant Diagnostic Results in last 30 days:  No results found.  Assessment/Plan Dysphagia with episodes of aspiration Will order chest x-ray again Also started on low-dose of prednisone 20 mg daily for 7 days to see if it helps his Bleeding and Rales Continue Ativan as needed for distress Check CBC Also Check BNP to Rule out CHF Discussed with DON and  family was able to convince and now patient is hospice Hospice consult was placed We will start him on morphine 5 mg every 4 hours as needed for his hypoxia Stage II pressure ulcer Also has unstageable pressure wound on Left Heel Heel Protectors Hydrogel and Air Mattress Type 2 diabetes with CKD A1C 6.7 On metformin and Januvia Also on sliding scale insulin Other Issue  Chronic combined systolic and diastolicCHF- Plan: Not on Lasix anymore Check BNP  Hypernatremia - Plan: Last sodium was 144 Continue Water supplement Have d/w wife that we want to avoid IV fluids andreason for his hypernatremia is combination of dysphagia and dementia  Essential hypertension - Plan: On Low dose of Coreg Depression On Wellbutrin .Dementia  Continue Supportive care and Namenda  Family/ staff Communication:   Labs/tests ordered:

## 2018-12-30 DIAGNOSIS — R0602 Shortness of breath: Secondary | ICD-10-CM | POA: Diagnosis not present

## 2018-12-30 LAB — CBC AND DIFFERENTIAL
HCT: 39 — AB (ref 41–53)
Hemoglobin: 12.2 — AB (ref 13.5–17.5)
Neutrophils Absolute: 8844
Platelets: 151 (ref 150–399)
WBC: 11

## 2019-01-06 ENCOUNTER — Non-Acute Institutional Stay (SKILLED_NURSING_FACILITY): Payer: Medicare Other | Admitting: Internal Medicine

## 2019-01-06 ENCOUNTER — Encounter: Payer: Self-pay | Admitting: Internal Medicine

## 2019-01-06 DIAGNOSIS — T17908S Unspecified foreign body in respiratory tract, part unspecified causing other injury, sequela: Secondary | ICD-10-CM

## 2019-01-06 DIAGNOSIS — I5042 Chronic combined systolic (congestive) and diastolic (congestive) heart failure: Secondary | ICD-10-CM | POA: Diagnosis not present

## 2019-01-06 DIAGNOSIS — L89812 Pressure ulcer of head, stage 2: Secondary | ICD-10-CM | POA: Diagnosis not present

## 2019-01-06 DIAGNOSIS — R1310 Dysphagia, unspecified: Secondary | ICD-10-CM | POA: Diagnosis not present

## 2019-01-06 LAB — BRAIN NATRIURETIC PEPTIDE: B Natriuretic Peptide: 944

## 2019-01-06 NOTE — Progress Notes (Signed)
Location:    Nursing Home Room Number: 30 Place of Service:  SNF (31) Provider:  Veleta Miners MD  [name], MD  Patient Care Team: [name], MD as PCP - General (Internal Medicine) [name], MD as PCP - Cardiology (Cardiology) Mast, Man X, NP as Nurse Practitioner (Internal Medicine) [name], [name], NP as Nurse Practitioner (Family Medicine)  Extended Emergency Contact Information Primary Emergency Contact: [name] Address: [place], [place] Phone: [phone] Mobile Phone: [phone] Relation: Spouse Secondary Emergency Contact: [name] [place] Phone: [phone] Work Phone: [phone] Relation: Daughter  Code Status:  DNR Goals of care: Advanced Directive information Advanced Directives 01/06/2019  Does Patient Have a Medical Advance Directive? Yes  Type of Paramedic of [place];Living will;Out of facility DNR (pink MOST or yellow form)  Does patient want to make changes to medical advance directive? No - Patient declined  Copy of [place] in Chart? Yes - validated most recent copy scanned in chart (See row information)  Would patient like information on creating a medical advance directive? -  Pre-existing out of facility DNR order (yellow form or pink MOST form) Yellow form placed in chart (order not valid for inpatient use)     Chief Complaint  Patient presents with  . Acute Visit    Short of breath    HPI:  Pt is a 85 y.o. [name] seen today for an acute visit for SOB   Patienthas h/o Recurrent Aspiration Pneumonia due to Dysphagiaand Dementiawith Hypernatremia He also has h/o Type 2 diabetes,Advanceddementia, hypertension, systolic CHFEF [id number], depression, anemia, due to GI Bleed Patient unable to give any history due to his dementia  Patient recently developed aspiration pneumonia again.  He  has been on doxycycline.  I had also started him on low-dose of prednisone.  Patient responded very well to prednisone.  Per nurses he was doing well.  But then today he had another episode of tachypnea.  They had to give him albuterol Nebs. Continues to not have any fever.  Unable to give any history due to his dementia. He also continues to lose weight.  His wife has agreed now to make him hospice and Roxanol and Ativan PRn    Past Medical History:  Diagnosis Date  . Abnormality of gait   . Backache, unspecified   . CHF (congestive heart failure) ([place])   . Colon polyps    adenomatous  . Dementia ([place]) 11/12/2012  . Depression   . Diabetes mellitus without complication ([place])   . Diverticulosis   . Essential and other specified forms of tremor   . Hemorrhoids   . History of bilateral hip replacements 11/12/2012  . Memory loss   . 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 Pain in joint, pelvic region and thigh   . Skin cancer of scalp   . Spinal stenosis, lumbar region, without neurogenic claudication   . Unspecified hereditary and idiopathic peripheral neuropathy    Past Surgical History:  Procedure Laterality Date  . CATARACT EXTRACTION, BILATERAL  2012  . JOINT REPLACEMENT    . RETINAL LASER PROCEDURE  2012   retinal wrinkle  . SKIN CANCER EXCISION    . TOTAL HIP ARTHROPLASTY Left 2000  . TOTAL HIP ARTHROPLASTY (aka REPLACEMENT) Right 2011    Allergies  Allergen Reactions  .  Axona [Bacid] Other (See Comments)    Unknown per MAR  . Gabapentin Other (See Comments)    Hallucinations    Allergies as of 01/06/2019      Reactions   Axona [bacid] Other (See Comments)   Unknown per MAR   Gabapentin Other (See Comments)   Hallucinations      Medication List       Accurate as of January 06, 2019  2:20 PM. If you have any questions, ask your nurse or doctor.        albuterol (2.5 MG/3ML) 0.083% nebulizer solution Commonly known as:  PROVENTIL Take 3 mLs (2.5 mg total) by nebulization every 4 (four) hours as needed for wheezing or shortness of breath.   ATIVAN PO Take 0.25 mLs by mouth every 6 (six) hours as needed. 0.5 mg = 0.19ml   buPROPion 100 MG 12 hr tablet Commonly known as: WELLBUTRIN SR Take 50 mg (half a tablet) by mouth twice daily.   carvedilol 6.25 MG tablet Commonly known as: COREG Take 6.25 mg by mouth 2 (two) times daily with a meal.   Dextromethorphan-guaiFENesin 10-100 MG/5ML Soln Take 20 mLs by mouth every 4 (four) hours as needed.   docusate 50 MG/5ML liquid Commonly known as: COLACE Take 100 mg by mouth daily.   dorzolamide-timolol 22.3-6.8 MG/ML ophthalmic solution Commonly known as: COSOPT Place 1 drop into the left eye 2 (two) times daily.   Florastor 250 MG capsule Generic drug: saccharomyces boulardii Take 250 mg by mouth 2 (two) times daily.   furosemide 20 MG tablet Commonly known as: LASIX Take 20 mg by mouth daily. On Mon, Wed,Fri   ipratropium-albuterol 0.5-2.5 (3) MG/3ML Soln Commonly known as: DUONEB Take 3 mLs by nebulization 2 (two) times daily.   memantine 10 MG tablet Commonly known as: NAMENDA Take 10 mg by mouth 2 (two) times daily.   metFORMIN 500 MG tablet Commonly known as: GLUCOPHAGE Take 500 mg by mouth 2 (two) times daily with a meal.   morphine 20 MG/ML concentrated solution Commonly known as: ROXANOL Take 0.13 mLs (2.6 mg total) by mouth every 4 (four) hours as needed for anxiety or shortness of breath.   NovoLOG 100 UNIT/ML injection Generic drug: insulin aspart Inject into the skin every morning. 100 unit/mL; amt: Per Sliding Scale;  If Blood Sugar is 250 to 300, give 5 Units. If Blood Sugar is greater than 300, give 8 Units. subcutaneous   nystatin powder Commonly known as: MYCOSTATIN/NYSTOP Apply topically 2 (two) times daily. Apply Nystatin Powder to left intergluteal fold and buttocks MASD until redness resolved   omeprazole 40 MG  capsule Commonly known as: PRILOSEC Take 40 mg by mouth daily.   OVER THE COUNTER MEDICATION Apply 41 % topically as needed. Aquaphor Healing(white petrolatum) ointment apply to dry or red skin   sitaGLIPtin 50 MG tablet Commonly known as: JANUVIA Take 50 mg by mouth daily.   zinc oxide 20 % ointment Apply 1 application topically as needed for irritation. To buttocks after every incontinent episode and as needed for redness       Review of Systems  Unable to perform ROS: Dementia    Immunization History  Administered Date(s) Administered  . Influenza-Unspecified 01/10/2017, 01/13/2018  . PPD Test 06/24/2016  . Pneumococcal-Unspecified 06/26/2012  . Td 12/02/2011  . Zoster Recombinat (Shingrix) 06/10/2017, 08/12/2017   Pertinent  Health Maintenance Due  Topic Date Due  . OPHTHALMOLOGY EXAM  06/06/1940  . URINE MICROALBUMIN  06/06/1940  .  FOOT EXAM  08/07/2018  . INFLUENZA VACCINE  11/01/2018  . HEMOGLOBIN A1C  06/17/2019  . PNA vac Low Risk Adult  Completed   Fall Risk  12/13/2017 10/14/2017 12/07/2016 01/19/2016 07/21/2015  Falls in the past year? No No No Yes Yes  Number falls in past yr: - - - 1 1  Injury with Fall? - - - No No  Risk for fall due to : - Impaired balance/gait - Impaired balance/gait -  Follow up - - - Falls prevention discussed Falls prevention discussed   Functional Status Survey:    Vitals:   01/06/19 1408  BP: 110/74  Pulse: 78  Resp: 16  Temp: (!) Jose.4 F (36.3 C)  SpO2: (!) 83%  Weight: 161 lb 11.2 oz (73.3 kg)  Height: 6' (1.829 m)   Body mass index is 21.93 kg/m. Physical Exam Vitals signs reviewed.  Constitutional:      Appearance: Normal appearance.  HENT:     Head: Normocephalic.     Nose: Nose normal.     Mouth/Throat:     Mouth: Mucous membranes are moist.     Pharynx: Oropharynx is clear.  Eyes:     Pupils: Pupils are equal, round, and reactive to light.  Neck:     Musculoskeletal: Neck supple.  Cardiovascular:      Rate and Rhythm: Regular rhythm. Tachycardia present.     Pulses: Normal pulses.  Pulmonary:     Effort: Pulmonary effort is normal.     Breath sounds: Wheezing and rales present.  Abdominal:     General: Abdomen is flat. Bowel sounds are normal.     Palpations: Abdomen is soft.  Musculoskeletal:        General: No swelling.  Skin:    General: Skin is warm.  Neurological:     General: No focal deficit present.     Mental Status: He is alert.  Psychiatric:        Mood and Affect: Mood normal.        Thought Content: Thought content normal.     Labs reviewed: Recent Labs    04/26/18 0316  05/13/18 0251  05/19/18 0306  08/31/18 0547 09/01/18 0821 09/03/18 0301  10/28/18 11/18/18 12/18/18  NA 148*   < > 142   < > 150*   < > 134* 141 144   < > 146 141 146  K 3.7   < > 4.1   < > 3.7   < > 4.6 3.9 3.5   < > 4.2 4.0 4.9  CL 115*   < > 115*   < > 117*   < > 99 106 110  --   --  102 108  CO2 26   < > 21*   < > 28   < > 22 23 24   --   --  29 28  GLUCOSE 168*   < > 104*   < > 152*   < > 354* 140* 196*  --   --   --   --   BUN 34*   < > 18   < > 20   < > 25* 17 20   < > 22* 20 27*  CREATININE 1.32*   < > 1.00   < > 0.Jose   < > 1.06 0.77 0.76   < > 0.9 0.8 0.8  CALCIUM 8.2*   < > 7.8*   < > 8.0*   < > 8.8* 8.7* 9.0  --   --  8.9 8.8  MG 2.2  --  2.1  --  2.1  --   --   --   --   --   --   --   --   PHOS 3.3  --  3.1  --   --   --   --   --   --   --   --   --   --    < > = values in this interval not displayed.   Recent Labs    04/27/18 0321 05/01/18 0034 05/11/18 1626 05/13/18 0251 05/28/18  AST 44* 47* 37  --  12*  ALT 40 60* 27  --  13  ALKPHOS 91 111 86  --  96  BILITOT 0.5 0.6 0.5  --   --   PROT 5.5* 6.2* 5.4*  --   --   ALBUMIN 2.5* 2.6* 2.5* 2.0*  --    Recent Labs    05/13/18 0505  08/30/18 2328 08/31/18 0547 09/01/18 0821 09/03/18 0301 09/16/18 12/18/18 12/30/18  WBC 8.2   < > 16.3* 11.6* 9.5 10.4 13.8 7.7 11.0  NEUTROABS 5.7  --  13.7*  --   --   --   --    --  8,844  HGB 5.5*   < > 14.3 12.6* 12.6* 13.5 13.8 11.7* 12.2*  HCT 19.1*   < > 44.6 38.9* 38.3* 41.7 42 37* 39*  MCV 92.7   < > 94.3 93.1 93.4 95.2  --   --   --   PLT 126*   < > 170 127* 131* 147* 105* 168 151   < > = values in this interval not displayed.   Lab Results  Component Value Date   TSH 2.61 08/08/2017   Lab Results  Component Value Date   HGBA1C 6.7 12/18/2018   Lab Results  Component Value Date   CHOL 158 06/23/2018   HDL 39 06/23/2018   LDLCALC 99 06/23/2018   TRIG 100 06/23/2018    Significant Diagnostic Results in last 30 days:  No results found.  Assessment/Plan Dysphagia with episodes of aspiration Was just treated with doxycycline for aspiration pneumonia Will restart him on low-dose of prednisone at 10 mg daily for another 7 days and see if that helps his wheezing We will also continue with albuterol nebs as needed For Comfort we will continue Ativan and Roxanol Patient is under hospice care Chronic combined systolic and diastolicCHF- Plan: BNP is high at 966 most likely due to Right sided Heart pressure Will continue on lower dose of lasix of 10 mg QD Repeat BMP in 1 week due to his h/o Hypernatremia  Stage II pressure ulcer Also has unstageable pressure wound on Left Heel Heel Protectors Hydrogel and Air Mattress Type 2 diabetes with CKD A1C 6.7 On metformin and Januvia Also on sliding scale insulin Other Issue Hypernatremia - Plan: Last sodium was 144 Continue Water supplement Have d/w wife that we want to avoid IV fluids andreason for his hypernatremia is combination of dysphagia and dementia  Essential hypertension - Plan: On Low dose of Coreg Depression On Wellbutrin .Dementia  Continue Supportive care and Namenda   Family/ staff Communication:   Labs/tests ordered:  BMP in 1 week  Total time spent in this patient care encounter was  _25  minutes; greater than 50% of the visit spent counseling  staff, reviewing  records , Labs and coordinating care for problems addressed at this encounter.

## 2019-01-13 LAB — BASIC METABOLIC PANEL
BUN: 22 — AB (ref 4–21)
Creatinine: 0.6 (ref 0.6–1.3)
Glucose: 139
Potassium: 3.5 (ref 3.4–5.3)
Sodium: 149 — AB (ref 137–147)

## 2019-01-15 ENCOUNTER — Non-Acute Institutional Stay (SKILLED_NURSING_FACILITY): Payer: Medicare Other | Admitting: Nurse Practitioner

## 2019-01-15 ENCOUNTER — Encounter: Payer: Self-pay | Admitting: Nurse Practitioner

## 2019-01-15 DIAGNOSIS — G308 Other Alzheimer's disease: Secondary | ICD-10-CM

## 2019-01-15 DIAGNOSIS — J9610 Chronic respiratory failure, unspecified whether with hypoxia or hypercapnia: Secondary | ICD-10-CM

## 2019-01-15 DIAGNOSIS — E870 Hyperosmolality and hypernatremia: Secondary | ICD-10-CM

## 2019-01-15 DIAGNOSIS — N1831 Chronic kidney disease, stage 3a: Secondary | ICD-10-CM

## 2019-01-15 DIAGNOSIS — I5042 Chronic combined systolic (congestive) and diastolic (congestive) heart failure: Secondary | ICD-10-CM | POA: Diagnosis not present

## 2019-01-15 DIAGNOSIS — F418 Other specified anxiety disorders: Secondary | ICD-10-CM | POA: Diagnosis not present

## 2019-01-15 DIAGNOSIS — E1121 Type 2 diabetes mellitus with diabetic nephropathy: Secondary | ICD-10-CM

## 2019-01-15 DIAGNOSIS — I10 Essential (primary) hypertension: Secondary | ICD-10-CM

## 2019-01-15 DIAGNOSIS — F0281 Dementia in other diseases classified elsewhere with behavioral disturbance: Secondary | ICD-10-CM

## 2019-01-15 DIAGNOSIS — K5900 Constipation, unspecified: Secondary | ICD-10-CM

## 2019-01-15 DIAGNOSIS — K219 Gastro-esophageal reflux disease without esophagitis: Secondary | ICD-10-CM

## 2019-01-15 LAB — CHLORIDE
Calcium: 8.8
Carbon Dioxide, Total: 30
Chloride: 109

## 2019-01-15 NOTE — Assessment & Plan Note (Signed)
Continue Hospice care, continue Memantine 10mg  bid for memory, Lorazepam 0.5mg  q6h prn. Prn Morphine 2.6mg  q4hr.

## 2019-01-15 NOTE — Assessment & Plan Note (Signed)
01/13/19 Na 149, K 3.5, Bun 22, creat 0.61, eGFR 90.  Encourage oral fluid, BMP one week. Reduce Furosemide '10mg'$  3x/wk.

## 2019-01-15 NOTE — Assessment & Plan Note (Signed)
Stable, continue Omeprazole 40mg qd.  

## 2019-01-15 NOTE — Progress Notes (Signed)
Location:   SNF [facility] Room Number: 30 Place of Service:  SNF (31) Provider:  Verl Kitson NP  [name], MD  Patient Care Team: [name], MD as PCP - General (Internal Medicine) [name], MD as PCP - Cardiology (Cardiology) Creedence Kunesh X, NP as Nurse Practitioner (Internal Medicine) [name], [name], NP as Nurse Practitioner (Family Medicine)  Extended Emergency Contact Information Primary Emergency Contact: [name] Address: [place], [place] Phone: [phone] Mobile Phone: [phone] Relation: Spouse Secondary Emergency Contact: [name] [place] Phone: [phone] Work Phone: [phone] Relation: Daughter  Code Status:  DNR Goals of care: Advanced Directive information Advanced Directives 01/15/2019  Does Patient Have a Medical Advance Directive? Yes  Type of Paramedic of [place];Living will;Out of facility DNR (pink MOST or yellow form)  Does patient want to make changes to medical advance directive? No - Patient declined  Copy of [place] in Chart? Yes - validated most recent copy scanned in chart (See row information)  Would patient like information on creating a medical advance directive? -  Pre-existing out of facility DNR order (yellow form or pink MOST form) Yellow form placed in chart (order not valid for inpatient use)     Chief Complaint  Patient presents with  . Medical Management of Chronic Issues  . Health Maintenance    Eye and foot exam, urine microalbumin, influenza vaccine    HPI:  Pt is a 85 y.o. male seen today for medical management of chronic diseases.    The patient has dysphagia, risk for dehydration and aspiration-completed 7 day cours of Prednisone, no wheezes today, on DuoNeb bid. , recurrent hypernatremia, last Na 149 01/13/19. His mood is stable, on Wellbutrin '100mg'$  bid. HTN, Sbp  in 150s, on Carvedilol 6.'25mg'$  bid. CHF, no apparent edema, on Furosemide '10mg'$  qd. Constipation, stable, on Colace '100mg'$  qd. T2DM, blood sugar is controlled on Januvia '50mg'$  qd, Metformin '500mg'$  bid, Novolog 4u 250-300, 8u if >300. He takes Memantine '10mg'$  bid for memory, Lorazepam 0.'5mg'$  q6h prn. Prn Morphine 2.'6mg'$  q4hr. GERD, stable, on Omeprazole '40mg'$  qd.    Past Medical History:  Diagnosis Date  . Abnormality of gait   . Backache, unspecified   . CHF (congestive heart failure) ([place])   . Colon polyps    adenomatous  . Dementia ([place]) 11/12/2012  . Depression   . Diabetes mellitus without complication ([place])   . Diverticulosis   . Essential and other specified forms of tremor   . Hemorrhoids   . History of bilateral hip replacements 11/12/2012  . Memory loss   . 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 Pain in joint, pelvic region and thigh   . Skin cancer of scalp   . Spinal stenosis, lumbar region, without neurogenic claudication   . Unspecified hereditary and idiopathic peripheral neuropathy    Past Surgical History:  Procedure Laterality Date  . CATARACT EXTRACTION, BILATERAL  2012  . JOINT REPLACEMENT    . RETINAL LASER PROCEDURE  2012   retinal wrinkle  . SKIN CANCER EXCISION    . TOTAL HIP ARTHROPLASTY Left 2000  . TOTAL HIP ARTHROPLASTY (aka REPLACEMENT) Right 2011    Allergies  Allergen Reactions  . Axona [Bacid] Other (See Comments)    Unknown per MAR  . Gabapentin Other (See Comments)  Hallucinations    Allergies as of 01/15/2019      Reactions   Axona [bacid] Other (See Comments)   Unknown per MAR   Gabapentin Other (See Comments)   Hallucinations      Medication List       Accurate as of January 15, 2019 12:02 PM. If you have any questions, ask your nurse or doctor.        albuterol (2.5 MG/3ML) 0.083% nebulizer solution Commonly known as: PROVENTIL Take 3 mLs (2.5 mg total) by nebulization every 4 (four) hours as  needed for wheezing or shortness of breath.   ATIVAN PO Take 0.25 mLs by mouth every 6 (six) hours as needed. 0.5 mg = 0.53m   buPROPion 100 MG 12 hr tablet Commonly known as: WELLBUTRIN SR Take 50 mg (half a tablet) by mouth twice daily.   carvedilol 6.25 MG tablet Commonly known as: COREG Take 6.25 mg by mouth 2 (two) times daily with a meal.   Dextromethorphan-guaiFENesin 10-100 MG/5ML Soln Take 20 mLs by mouth every 4 (four) hours as needed.   docusate 50 MG/5ML liquid Commonly known as: COLACE Take 100 mg by mouth daily.   dorzolamide-timolol 22.3-6.8 MG/ML ophthalmic solution Commonly known as: COSOPT Place 1 drop into the left eye 2 (two) times daily.   furosemide 20 MG tablet Commonly known as: LASIX Take 10 mg by mouth daily. 1/2 a tab.   ipratropium-albuterol 0.5-2.5 (3) MG/3ML Soln Commonly known as: DUONEB Take 3 mLs by nebulization 2 (two) times daily.   memantine 10 MG tablet Commonly known as: NAMENDA Take 10 mg by mouth 2 (two) times daily.   metFORMIN 500 MG tablet Commonly known as: GLUCOPHAGE Take 500 mg by mouth 2 (two) times daily with a meal.   morphine 20 MG/ML concentrated solution Commonly known as: ROXANOL Take 0.13 mLs (2.6 mg total) by mouth every 4 (four) hours as needed for anxiety or shortness of breath.   NovoLOG 100 UNIT/ML injection Generic drug: insulin aspart Inject into the skin every morning. 100 unit/mL; amt: Per Sliding Scale;  If Blood Sugar is 250 to 300, give 5 Units. If Blood Sugar is greater than 300, give 8 Units. subcutaneous   nystatin powder Commonly known as: MYCOSTATIN/NYSTOP Apply topically 2 (two) times daily. Apply Nystatin Powder to left intergluteal fold and buttocks MASD until redness resolved   omeprazole 40 MG capsule Commonly known as: PRILOSEC Take 40 mg by mouth daily.   OVER THE COUNTER MEDICATION Apply 41 % topically as needed. Aquaphor Healing(white petrolatum) ointment apply to dry or red  skin   predniSONE 10 MG tablet Commonly known as: DELTASONE Take 10 mg by mouth daily with breakfast.   sitaGLIPtin 50 MG tablet Commonly known as: JANUVIA Take 50 mg by mouth daily.   zinc oxide 20 % ointment Apply 1 application topically as needed for irritation. To buttocks after every incontinent episode and as needed for redness      ROS was provided with assistance of staff.  Review of Systems  Constitutional: Positive for activity change, fatigue and unexpected weight change. Negative for appetite change, chills, diaphoresis and fever.       Weight loss  HENT: Positive for hearing loss. Negative for congestion and voice change.   Respiratory: Positive for cough and shortness of breath. Negative for wheezing.   Cardiovascular: Negative for chest pain, palpitations and leg swelling.  Gastrointestinal: Negative for abdominal distention, abdominal pain, constipation, diarrhea, nausea and vomiting.  Genitourinary: Negative for  difficulty urinating, dysuria and urgency.  Musculoskeletal: Positive for gait problem.  Skin: Negative for color change and pallor.  Neurological: Positive for speech difficulty. Negative for dizziness, weakness and headaches.       Dementia, expressive aphasia.  Psychiatric/Behavioral: Negative for agitation, behavioral problems, hallucinations and sleep disturbance. The patient is not nervous/anxious.     Immunization History  Administered Date(s) Administered  . Influenza-Unspecified 01/10/2017, 01/13/2018  . PPD Test 06/24/2016  . Pneumococcal-Unspecified 06/26/2012  . Td 12/02/2011  . Zoster Recombinat (Shingrix) 06/10/2017, 08/12/2017   Pertinent  Health Maintenance Due  Topic Date Due  . OPHTHALMOLOGY EXAM  06/06/1940  . URINE MICROALBUMIN  06/06/1940  . FOOT EXAM  08/07/2018  . INFLUENZA VACCINE  11/01/2018  . HEMOGLOBIN A1C  06/17/2019  . PNA vac Low Risk Adult  Completed   Fall Risk  12/13/2017 10/14/2017 12/07/2016 01/19/2016 07/21/2015   Falls in the past year? No No No Yes Yes  Number falls in past yr: - - - 1 1  Injury with Fall? - - - No No  Risk for fall due to : - Impaired balance/gait - Impaired balance/gait -  Follow up - - - Falls prevention discussed Falls prevention discussed   Functional Status Survey:    Vitals:   01/15/19 0913  BP: (!) 154/80  Pulse: 80  Resp: 16  Temp: (!) 97 F (36.1 C)  SpO2: 94%  Weight: 154 lb 6.4 oz (70 kg)  Height: 6' (1.829 m)   Body mass index is 20.94 kg/m. Physical Exam Vitals signs and nursing note reviewed.  Constitutional:      General: He is not in acute distress.    Appearance: Normal appearance. He is not ill-appearing, toxic-appearing or diaphoretic.  HENT:     Head: Normocephalic and atraumatic.     Nose: Nose normal.     Mouth/Throat:     Mouth: Mucous membranes are dry.  Eyes:     Extraocular Movements: Extraocular movements intact.     Conjunctiva/sclera: Conjunctivae normal.     Pupils: Pupils are equal, round, and reactive to light.  Neck:     Musculoskeletal: Normal range of motion and neck supple.  Cardiovascular:     Rate and Rhythm: Normal rate and regular rhythm.     Heart sounds: No murmur.  Pulmonary:     Breath sounds: Rales present. No rhonchi.     Comments: Rales posterior mid to lower lungs. Chronic O2 dependent. Abdominal:     General: Bowel sounds are normal. There is no distension.     Palpations: Abdomen is soft.     Tenderness: There is no abdominal tenderness. There is no right CVA tenderness, left CVA tenderness, guarding or rebound.  Musculoskeletal:     Right lower leg: No edema.     Left lower leg: No edema.  Skin:    General: Skin is warm and dry.  Neurological:     General: No focal deficit present.     Mental Status: He is alert. Mental status is at baseline.     Cranial Nerves: No cranial nerve deficit.     Motor: No weakness.     Coordination: Coordination abnormal.     Gait: Gait abnormal.     Comments:  Oriented to self.   Psychiatric:        Mood and Affect: Mood normal.        Behavior: Behavior normal.     Labs reviewed: Recent Labs    04/26/18 0316  05/13/18 0251  05/19/18 0306  08/31/18 0547 09/01/18 0821 09/03/18 0301  11/18/18 12/18/18 01/13/19  NA 148*   < > 142   < > 150*   < > 134* 141 144   < > 141 146 149*  K 3.7   < > 4.1   < > 3.7   < > 4.6 3.9 3.5   < > 4.0 4.9 3.5  CL 115*   < > 115*   < > 117*   < > 99 106 110  --  102 108 109  CO2 26   < > 21*   < > 28   < > '22 23 24  '$ --  '29 28 30  '$ GLUCOSE 168*   < > 104*   < > 152*   < > 354* 140* 196*  --   --   --   --   BUN 34*   < > 18   < > 20   < > 25* 17 20   < > 20 27* 22*  CREATININE 1.32*   < > 1.00   < > 0.97   < > 1.06 0.77 0.76   < > 0.8 0.8 0.6  CALCIUM 8.2*   < > 7.8*   < > 8.0*   < > 8.8* 8.7* 9.0  --  8.9 8.8 8.8  MG 2.2  --  2.1  --  2.1  --   --   --   --   --   --   --   --   PHOS 3.3  --  3.1  --   --   --   --   --   --   --   --   --   --    < > = values in this interval not displayed.   Recent Labs    04/27/18 0321 05/01/18 0034 05/11/18 1626 05/13/18 0251 05/28/18  AST 44* 47* 37  --  12*  ALT 40 60* 27  --  13  ALKPHOS 91 111 86  --  96  BILITOT 0.5 0.6 0.5  --   --   PROT 5.5* 6.2* 5.4*  --   --   ALBUMIN 2.5* 2.6* 2.5* 2.0*  --    Recent Labs    05/13/18 0505  08/30/18 2328 08/31/18 0547 09/01/18 0821 09/03/18 0301 09/16/18 12/18/18 12/30/18  WBC 8.2   < > 16.3* 11.6* 9.5 10.4 13.8 7.7 11.0  NEUTROABS 5.7  --  13.7*  --   --   --   --   --  8,844  HGB 5.5*   < > 14.3 12.6* 12.6* 13.5 13.8 11.7* 12.2*  HCT 19.1*   < > 44.6 38.9* 38.3* 41.7 42 37* 39*  MCV 92.7   < > 94.3 93.1 93.4 95.2  --   --   --   PLT 126*   < > 170 127* 131* 147* 105* 168 151   < > = values in this interval not displayed.   Lab Results  Component Value Date   TSH 2.61 08/08/2017   Lab Results  Component Value Date   HGBA1C 6.7 12/18/2018   Lab Results  Component Value Date   CHOL 158 06/23/2018    HDL 39 06/23/2018   LDLCALC 99 06/23/2018   TRIG 100 06/23/2018    Significant Diagnostic Results in last 30 days:  No results found.  Assessment/Plan Hypernatremia 01/13/19 Na 149, K 3.5, Bun  22, creat 0.61, eGFR 90.  Encourage oral fluid, BMP one week. Reduce Furosemide '10mg'$  3x/wk.    Chronic combined systolic and diastolic congestive heart failure (HCC) No wheezes or apparent edema, will decrease Furosemide to 3x/wk. Observe.   Chronic respiratory failure (HCC) Continue O2 via [place], DuoNeb bid.   Depression with anxiety His mood is stable, continue Wellbutrin '100mg'$  bid.   HTN (hypertension) Mild elevated Sbp, continue Carvedilol 6.'25mg'$  bid.   GERD (gastroesophageal reflux disease) Stable, continue Omeprazole '40mg'$  qd.   Constipation Stable, continue Colace '100mg'$  qd.   Type 2 diabetes mellitus with diabetic chronic kidney disease ([place]) Stable, continue  Januvia '50mg'$  qd, Metformin '500mg'$  bid, Novolog 4u 250-300, 8u if >300  Alzheimer disease ([place]) Continue Hospice care, continue Memantine '10mg'$  bid for memory, Lorazepam 0.'5mg'$  q6h prn. Prn Morphine 2.'6mg'$  q4hr.     Family/ staff Communication: plan of care reviewed with the patient and charge nurse.   Labs/tests ordered:  Pending f/u BMP  Time spend 25 minutes.

## 2019-01-15 NOTE — Assessment & Plan Note (Signed)
Continue O2 via [place], DuoNeb bid.

## 2019-01-15 NOTE — Assessment & Plan Note (Signed)
Stable, continue  Januvia 50mg  qd, Metformin 500mg  bid, Novolog 4u 250-300, 8u if >300

## 2019-01-15 NOTE — Assessment & Plan Note (Signed)
Mild elevated Sbp, continue Carvedilol 6.25mg  bid.

## 2019-01-15 NOTE — Assessment & Plan Note (Signed)
His mood is stable, continue Wellbutrin 100mg  bid.

## 2019-01-15 NOTE — Assessment & Plan Note (Signed)
Stable, continue Colace 100mg qd.  

## 2019-01-15 NOTE — Assessment & Plan Note (Signed)
No wheezes or apparent edema, will decrease Furosemide to 3x/wk. Observe.

## 2019-01-20 ENCOUNTER — Non-Acute Institutional Stay (SKILLED_NURSING_FACILITY): Payer: Medicare Other | Admitting: Internal Medicine

## 2019-01-20 DIAGNOSIS — I5042 Chronic combined systolic (congestive) and diastolic (congestive) heart failure: Secondary | ICD-10-CM

## 2019-01-20 DIAGNOSIS — E870 Hyperosmolality and hypernatremia: Secondary | ICD-10-CM | POA: Diagnosis not present

## 2019-01-20 DIAGNOSIS — R627 Adult failure to thrive: Secondary | ICD-10-CM

## 2019-01-20 DIAGNOSIS — R1310 Dysphagia, unspecified: Secondary | ICD-10-CM

## 2019-01-20 LAB — BASIC METABOLIC PANEL
BUN: 35 — AB (ref 4–21)
Creatinine: 0.9 (ref 0.6–1.3)
Glucose: 215
Potassium: 3.6 (ref 3.4–5.3)
Sodium: 157 — AB (ref 137–147)

## 2019-01-20 NOTE — Progress Notes (Signed)
Location: [facility] manager of Service:  SNF (31)  Provider:   Code Status: Hospice Care Goals of Care:  Advanced Directives 01/15/2019  Does Patient Have a Medical Advance Directive? Yes  Type of Paramedic of [place];Living will;Out of facility DNR (pink MOST or yellow form)  Does patient want to make changes to medical advance directive? No - Patient declined  Copy of [place] in Chart? Yes - validated most recent copy scanned in chart (See row information)  Would patient like information on creating a medical advance directive? -  Pre-existing out of facility DNR order (yellow form or pink MOST form) Yellow form placed in chart (order not valid for inpatient use)     Chief Complaint  Patient presents with  . Acute Visit    HPI: Patient is a 85 y.o. male seen today for an acute visit for hypernatremia.  Patienthas h/o Recurrent Aspiration Pneumonia due to Dysphagiaand Dementiawith Hypernatremia He also has h/o Type 2 diabetes,Advanceddementia, hypertension, systolic CHFEF [id number], depression, anemia, due to GI Bleed Patient unable to give any history due to his dementia  Patient is under hospice care now but his wife still wants [place] to continue treatment.  We have him on chronic prednisone to prevent him from getting episodes of tachypnea due to dysphagia and aspiration.  He also continues to get aspiration pneumonia. He also continues on Roxanol and Ativan Patient had a regular BMP done today which showed that his sodium has gone up from  149-157. Bun and creat are near his baseline I had talked to his wife before that at this stage he is not a candidate for IV fluids are any kind of aggressive treatment . The wife is in the room today.  Patient much more responsive.  Denied any complaints but then went to sleep.     Past Medical History:  Diagnosis Date  . Abnormality of gait   . Backache, unspecified   .  CHF (congestive heart failure) ([place])   . Colon polyps    adenomatous  . Dementia ([place]) 11/12/2012  . Depression   . Diabetes mellitus without complication ([place])   . Diverticulosis   . Essential and other specified forms of tremor   . Hemorrhoids   . History of bilateral hip replacements 11/12/2012  . Memory loss   . 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 Pain in joint, pelvic region and thigh   . Skin cancer of scalp   . Spinal stenosis, lumbar region, without neurogenic claudication   . Unspecified hereditary and idiopathic peripheral neuropathy     Past Surgical History:  Procedure Laterality Date  . CATARACT EXTRACTION, BILATERAL  2012  . JOINT REPLACEMENT    . RETINAL LASER PROCEDURE  2012   retinal wrinkle  . SKIN CANCER EXCISION    . TOTAL HIP ARTHROPLASTY Left 2000  . TOTAL HIP ARTHROPLASTY (aka REPLACEMENT) Right 2011    Allergies  Allergen Reactions  . Axona [Bacid] Other (See Comments)    Unknown per MAR  . Gabapentin Other (See Comments)    Hallucinations    Outpatient Encounter Medications as of 01/20/2019  Medication Sig  . albuterol (PROVENTIL) (2.5 MG/3ML) 0.083% nebulizer solution Take 3 mLs (2.5 mg total) by nebulization every 4 (four) hours as needed for wheezing or shortness of breath.  Marland Kitchen buPROPion (WELLBUTRIN SR) 100 MG 12 hr tablet Take 50 mg (half a tablet) by mouth  twice daily.  . carvedilol (COREG) 6.25 MG tablet Take 6.25 mg by mouth 2 (two) times daily with a meal.  . Dextromethorphan-guaiFENesin 10-100 MG/5ML SOLN Take 20 mLs by mouth every 4 (four) hours as needed.  . docusate (COLACE) 50 MG/5ML liquid Take 100 mg by mouth daily.  . dorzolamide-timolol (COSOPT) 22.3-6.8 MG/ML ophthalmic solution Place 1 drop into the left eye 2 (two) times daily.   . furosemide (LASIX) 20 MG tablet Take 10 mg by mouth daily. 1/2 a tab.  . insulin aspart (NOVOLOG) 100 UNIT/ML injection Inject into the skin every morning. 100  unit/mL; amt: Per Sliding Scale;  If Blood Sugar is 250 to 300, give 5 Units. If Blood Sugar is greater than 300, give 8 Units. subcutaneous  . ipratropium-albuterol (DUONEB) 0.5-2.5 (3) MG/3ML SOLN Take 3 mLs by nebulization 2 (two) times daily.  Marland Kitchen LORazepam (ATIVAN PO) Take 0.25 mLs by mouth every 6 (six) hours as needed. 0.5 mg = 0.22ml  . memantine (NAMENDA) 10 MG tablet Take 10 mg by mouth 2 (two) times daily.  . metFORMIN (GLUCOPHAGE) 500 MG tablet Take 500 mg by mouth 2 (two) times daily with a meal.  . morphine (ROXANOL) 20 MG/ML concentrated solution Take 0.13 mLs (2.6 mg total) by mouth every 4 (four) hours as needed for anxiety or shortness of breath.  . nystatin (MYCOSTATIN/NYSTOP) powder Apply topically 2 (two) times daily. Apply Nystatin Powder to left intergluteal fold and buttocks MASD until redness resolved  . omeprazole (PRILOSEC) 40 MG capsule Take 40 mg by mouth daily.  Marland Kitchen OVER THE COUNTER MEDICATION Apply 41 % topically as needed. Aquaphor Healing(white petrolatum) ointment apply to dry or red skin  . predniSONE (DELTASONE) 10 MG tablet Take 10 mg by mouth daily with breakfast.  . sitaGLIPtin (JANUVIA) 50 MG tablet Take 50 mg by mouth daily.  Marland Kitchen zinc oxide 20 % ointment Apply 1 application topically as needed for irritation. To buttocks after every incontinent episode and as needed for redness   No facility-administered encounter medications on file as of 01/20/2019.     Review of Systems:  Review of Systems  Unable to perform ROS: Dementia    Health Maintenance  Topic Date Due  . OPHTHALMOLOGY EXAM  06/06/1940  . URINE MICROALBUMIN  06/06/1940  . FOOT EXAM  08/07/2018  . INFLUENZA VACCINE  11/01/2018  . TETANUS/TDAP  09/29/2026 (Originally 12/01/2021)  . HEMOGLOBIN A1C  06/17/2019  . PNA vac Low Risk Adult  Completed    Physical Exam: Vitals:   01/21/19 1931  BP: 134/70  Pulse: 84  Resp: (!) 22  Temp: (!) 97.1 F (36.2 C)  SpO2: 92%  Weight: 148 lb (67.1  kg)   Body mass index is 20.07 kg/m. Physical Exam  Constitutional:Well-developed and well-nourished.  HENT:  Head: Normocephalic.  Mouth/Throat: Oropharynx is clear and dry Eyes: Pupils are equal, round, and reactive to light.  Neck: Neck supple.  Cardiovascular: Normal rate and normal heart sounds.  No murmur heard. Pulmonary/Chest: Effort normal and breath sounds normal. No respiratory distress. No wheezes. She has no rales.  Abdominal: Soft. Bowel sounds are normal. No distension. There is no tenderness. There is no rebound.  Musculoskeletal: No edema.  Lymphadenopathy: none Neurological: No Deficits Skin: Skin is warm and dry.  Psychiatric: Normal mood and affect. Behavior is normal. Thought content normal.    Labs reviewed: Basic Metabolic Panel: Recent Labs    04/26/18 0316  05/13/18 0251  05/19/18 0306  08/31/18 0547 09/01/18  [id number] 09/03/18 0301  11/18/18 12/18/18 01/13/19  NA 148*   < > 142   < > 150*   < > 134* 141 144   < > 141 146 149*  K 3.7   < > 4.1   < > 3.7   < > 4.6 3.9 3.5   < > 4.0 4.9 3.5  CL 115*   < > 115*   < > 117*   < > 99 106 110  --  102 108 109  CO2 26   < > 21*   < > 28   < > 22 23 24   --  29 28 30   GLUCOSE 168*   < > 104*   < > 152*   < > 354* 140* 196*  --   --   --   --   BUN 34*   < > 18   < > 20   < > 25* 17 20   < > 20 27* 22*  CREATININE 1.32*   < > 1.00   < > 0.97   < > 1.06 0.77 0.76   < > 0.8 0.8 0.6  CALCIUM 8.2*   < > 7.8*   < > 8.0*   < > 8.8* 8.7* 9.0  --  8.9 8.8 8.8  MG 2.2  --  2.1  --  2.1  --   --   --   --   --   --   --   --   PHOS 3.3  --  3.1  --   --   --   --   --   --   --   --   --   --    < > = values in this interval not displayed.   Liver Function Tests: Recent Labs    04/27/18 0321 05/01/18 0034 05/11/18 1626 05/13/18 0251 05/28/18  AST 44* 47* 37  --  12*  ALT 40 60* 27  --  13  ALKPHOS 91 111 86  --  96  BILITOT 0.5 0.6 0.5  --   --   PROT 5.5* 6.2* 5.4*  --   --   ALBUMIN 2.5* 2.6* 2.5* 2.0*  --     No results for input(s): LIPASE, AMYLASE in the last 8760 hours. No results for input(s): AMMONIA in the last 8760 hours. CBC: Recent Labs    05/13/18 0505  08/30/18 2328 08/31/18 0547 09/01/18 0821 09/03/18 0301 09/16/18 12/18/18 12/30/18  WBC 8.2   < > 16.3* 11.6* 9.5 10.4 13.8 7.7 11.0  NEUTROABS 5.7  --  13.7*  --   --   --   --   --  8,844  HGB 5.5*   < > 14.3 12.6* 12.6* 13.5 13.8 11.7* 12.2*  HCT 19.1*   < > 44.6 38.9* 38.3* 41.7 42 37* 39*  MCV 92.7   < > 94.3 93.1 93.4 95.2  --   --   --   PLT 126*   < > 170 127* 131* 147* 105* 168 151   < > = values in this interval not displayed.   Lipid Panel: Recent Labs    06/23/18  CHOL 158  HDL 39  LDLCALC 99  TRIG 100   Lab Results  Component Value Date   HGBA1C 6.7 12/18/2018    Procedures since last visit: No results found.  Assessment/Plan Hypernatremia with failure to thrive, weight loss, end-stage dementia, dysphagia Patient has lost almost  20 pounds in past 4-6 weeks Wife is aware He is under hospice care Will discontinue his Namenda Metformin and Lasix Wife still wanted [place] to continue doing other treatments Talked to the D ON and the nurses in charge to continue pushing p.o. water. I wrote the order again.  Patient  continues to take p.o. We will continue him on Roxanol and Ativan for now   Labs/tests ordered:   Next appt:  Visit date not found Total time spent in this patient care encounter was  25_  minutes; greater than 50% of the visit spent counseling wife and  staff, reviewing records , Labs and coordinating care for problems addressed at this encounter.

## 2019-01-21 ENCOUNTER — Encounter: Payer: Self-pay | Admitting: Internal Medicine

## 2019-02-01 DEATH — deceased

## 2019-02-03 ENCOUNTER — Encounter: Payer: Self-pay | Admitting: Internal Medicine

## 2019-02-03 LAB — CHLORIDE
Calcium: 8.7
Carbon Dioxide, Total: 31
Chloride: 116

## 2019-02-03 NOTE — Progress Notes (Signed)
This encounter was created in error - please disregard.
